# Patient Record
Sex: Female | Born: 1935
Health system: Southern US, Community
[De-identification: ages and names within clinical notes are randomized; demographics above are authoritative.]

## PROBLEM LIST (undated history)

## (undated) DIAGNOSIS — R202 Paresthesia of skin: Secondary | ICD-10-CM

## (undated) DIAGNOSIS — S43005A Unspecified dislocation of left shoulder joint, initial encounter: Secondary | ICD-10-CM

## (undated) DIAGNOSIS — C50912 Malignant neoplasm of unspecified site of left female breast: Secondary | ICD-10-CM

## (undated) DIAGNOSIS — R413 Other amnesia: Secondary | ICD-10-CM

## (undated) DIAGNOSIS — I10 Essential (primary) hypertension: Secondary | ICD-10-CM

## (undated) DIAGNOSIS — I639 Cerebral infarction, unspecified: Secondary | ICD-10-CM

## (undated) DIAGNOSIS — E785 Hyperlipidemia, unspecified: Secondary | ICD-10-CM

## (undated) DIAGNOSIS — Z8042 Family history of malignant neoplasm of prostate: Secondary | ICD-10-CM

## (undated) DIAGNOSIS — C801 Malignant (primary) neoplasm, unspecified: Secondary | ICD-10-CM

## (undated) DIAGNOSIS — R200 Anesthesia of skin: Secondary | ICD-10-CM

## (undated) DIAGNOSIS — M1990 Unspecified osteoarthritis, unspecified site: Secondary | ICD-10-CM

## (undated) HISTORY — PX: BREAST SURGERY: SHX581

## (undated) HISTORY — DX: Other amnesia: R41.3

## (undated) HISTORY — DX: Family history of malignant neoplasm of prostate: Z80.42

## (undated) HISTORY — PX: EYE SURGERY: SHX253

## (undated) HISTORY — PX: JOINT REPLACEMENT: SHX530

## (undated) HISTORY — DX: Paresthesia of skin: R20.2

## (undated) HISTORY — DX: Essential (primary) hypertension: I10

## (undated) HISTORY — DX: Anesthesia of skin: R20.0

## (undated) HISTORY — PX: APPENDECTOMY: SHX54

## (undated) HISTORY — PX: COLONOSCOPY: SHX174

## (undated) HISTORY — DX: Hyperlipidemia, unspecified: E78.5

## (undated) HISTORY — PX: ABDOMINAL HYSTERECTOMY: SHX81

## (undated) HISTORY — DX: Malignant (primary) neoplasm, unspecified: C80.1

---
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
## 1997-12-17 DIAGNOSIS — C50919 Malignant neoplasm of unspecified site of unspecified female breast: Secondary | ICD-10-CM

## 1997-12-17 HISTORY — DX: Malignant neoplasm of unspecified site of unspecified female breast: C50.919

## 1997-12-17 HISTORY — PX: MASTECTOMY: SHX3

## 1998-01-31 ENCOUNTER — Ambulatory Visit (HOSPITAL_COMMUNITY): Admission: RE | Admit: 1998-01-31 | Discharge: 1998-01-31 | Payer: Self-pay | Admitting: Surgery

## 1998-02-24 ENCOUNTER — Ambulatory Visit (HOSPITAL_COMMUNITY): Admission: RE | Admit: 1998-02-24 | Discharge: 1998-02-24 | Payer: Self-pay | Admitting: Surgery

## 1998-03-08 ENCOUNTER — Ambulatory Visit (HOSPITAL_COMMUNITY): Admission: AD | Admit: 1998-03-08 | Discharge: 1998-03-08 | Payer: Self-pay | Admitting: Surgery

## 1998-04-06 ENCOUNTER — Ambulatory Visit (HOSPITAL_COMMUNITY): Admission: RE | Admit: 1998-04-06 | Discharge: 1998-04-07 | Payer: Self-pay | Admitting: Surgery

## 1998-11-28 ENCOUNTER — Encounter: Payer: Self-pay | Admitting: Surgery

## 1998-11-28 ENCOUNTER — Ambulatory Visit (HOSPITAL_COMMUNITY): Admission: RE | Admit: 1998-11-28 | Discharge: 1998-11-28 | Payer: Self-pay | Admitting: Surgery

## 1999-05-29 ENCOUNTER — Ambulatory Visit (HOSPITAL_COMMUNITY): Admission: RE | Admit: 1999-05-29 | Discharge: 1999-05-29 | Payer: Self-pay | Admitting: Surgery

## 1999-07-11 ENCOUNTER — Encounter: Payer: Self-pay | Admitting: Surgery

## 1999-07-11 ENCOUNTER — Ambulatory Visit (HOSPITAL_BASED_OUTPATIENT_CLINIC_OR_DEPARTMENT_OTHER): Admission: RE | Admit: 1999-07-11 | Discharge: 1999-07-11 | Payer: Self-pay | Admitting: Surgery

## 1999-12-19 ENCOUNTER — Ambulatory Visit (HOSPITAL_COMMUNITY): Admission: RE | Admit: 1999-12-19 | Discharge: 1999-12-19 | Payer: Self-pay | Admitting: Surgery

## 1999-12-19 ENCOUNTER — Encounter: Payer: Self-pay | Admitting: Surgery

## 2000-02-02 ENCOUNTER — Encounter: Admission: RE | Admit: 2000-02-02 | Discharge: 2000-02-02 | Payer: Self-pay | Admitting: Family Medicine

## 2000-02-02 ENCOUNTER — Encounter: Payer: Self-pay | Admitting: Family Medicine

## 2000-12-27 ENCOUNTER — Encounter: Payer: Self-pay | Admitting: Surgery

## 2000-12-27 ENCOUNTER — Ambulatory Visit (HOSPITAL_COMMUNITY): Admission: RE | Admit: 2000-12-27 | Discharge: 2000-12-27 | Payer: Self-pay | Admitting: Surgery

## 2001-12-30 ENCOUNTER — Encounter: Payer: Self-pay | Admitting: Surgery

## 2001-12-30 ENCOUNTER — Ambulatory Visit (HOSPITAL_COMMUNITY): Admission: RE | Admit: 2001-12-30 | Discharge: 2001-12-30 | Payer: Self-pay | Admitting: Surgery

## 2002-12-31 ENCOUNTER — Ambulatory Visit (HOSPITAL_COMMUNITY): Admission: RE | Admit: 2002-12-31 | Discharge: 2002-12-31 | Payer: Self-pay | Admitting: Internal Medicine

## 2002-12-31 ENCOUNTER — Encounter: Payer: Self-pay | Admitting: Internal Medicine

## 2003-06-11 ENCOUNTER — Ambulatory Visit (HOSPITAL_BASED_OUTPATIENT_CLINIC_OR_DEPARTMENT_OTHER): Admission: RE | Admit: 2003-06-11 | Discharge: 2003-06-11 | Payer: Self-pay | Admitting: Surgery

## 2003-06-11 ENCOUNTER — Encounter (INDEPENDENT_AMBULATORY_CARE_PROVIDER_SITE_OTHER): Payer: Self-pay | Admitting: Specialist

## 2003-08-05 ENCOUNTER — Emergency Department (HOSPITAL_COMMUNITY): Admission: EM | Admit: 2003-08-05 | Discharge: 2003-08-05 | Payer: Self-pay

## 2003-08-05 ENCOUNTER — Encounter: Payer: Self-pay | Admitting: Emergency Medicine

## 2003-09-08 ENCOUNTER — Ambulatory Visit (HOSPITAL_BASED_OUTPATIENT_CLINIC_OR_DEPARTMENT_OTHER): Admission: RE | Admit: 2003-09-08 | Discharge: 2003-09-08 | Payer: Self-pay | Admitting: Orthopedic Surgery

## 2003-09-08 ENCOUNTER — Ambulatory Visit (HOSPITAL_COMMUNITY): Admission: RE | Admit: 2003-09-08 | Discharge: 2003-09-08 | Payer: Self-pay | Admitting: Orthopedic Surgery

## 2004-01-05 ENCOUNTER — Ambulatory Visit (HOSPITAL_COMMUNITY): Admission: RE | Admit: 2004-01-05 | Discharge: 2004-01-05 | Payer: Self-pay | Admitting: Surgery

## 2005-01-05 ENCOUNTER — Ambulatory Visit (HOSPITAL_COMMUNITY): Admission: RE | Admit: 2005-01-05 | Discharge: 2005-01-05 | Payer: Self-pay | Admitting: Surgery

## 2006-01-07 ENCOUNTER — Ambulatory Visit (HOSPITAL_COMMUNITY): Admission: RE | Admit: 2006-01-07 | Discharge: 2006-01-07 | Payer: Self-pay | Admitting: Surgery

## 2006-01-23 ENCOUNTER — Encounter: Admission: RE | Admit: 2006-01-23 | Discharge: 2006-01-23 | Payer: Self-pay | Admitting: Surgery

## 2006-03-04 ENCOUNTER — Encounter: Admission: RE | Admit: 2006-03-04 | Discharge: 2006-03-04 | Payer: Self-pay | Admitting: Surgery

## 2006-03-11 ENCOUNTER — Encounter: Admission: RE | Admit: 2006-03-11 | Discharge: 2006-03-11 | Payer: Self-pay | Admitting: Surgery

## 2006-04-17 ENCOUNTER — Encounter: Payer: Self-pay | Admitting: Surgery

## 2007-01-24 ENCOUNTER — Encounter: Admission: RE | Admit: 2007-01-24 | Discharge: 2007-01-24 | Payer: Self-pay | Admitting: Surgery

## 2007-02-20 ENCOUNTER — Encounter: Admission: RE | Admit: 2007-02-20 | Discharge: 2007-02-20 | Payer: Self-pay | Admitting: Surgery

## 2007-02-20 ENCOUNTER — Encounter (INDEPENDENT_AMBULATORY_CARE_PROVIDER_SITE_OTHER): Payer: Self-pay | Admitting: Specialist

## 2007-07-29 ENCOUNTER — Encounter: Admission: RE | Admit: 2007-07-29 | Discharge: 2007-07-29 | Payer: Self-pay | Admitting: Orthopedic Surgery

## 2007-07-29 ENCOUNTER — Encounter: Admission: RE | Admit: 2007-07-29 | Discharge: 2007-07-29 | Payer: Self-pay | Admitting: Surgery

## 2007-07-30 ENCOUNTER — Ambulatory Visit (HOSPITAL_BASED_OUTPATIENT_CLINIC_OR_DEPARTMENT_OTHER): Admission: RE | Admit: 2007-07-30 | Discharge: 2007-07-30 | Payer: Self-pay | Admitting: Orthopedic Surgery

## 2008-03-30 ENCOUNTER — Encounter: Admission: RE | Admit: 2008-03-30 | Discharge: 2008-03-30 | Payer: Self-pay | Admitting: Surgery

## 2008-07-29 ENCOUNTER — Encounter: Admission: RE | Admit: 2008-07-29 | Discharge: 2008-07-29 | Payer: Self-pay | Admitting: Surgery

## 2009-01-04 ENCOUNTER — Encounter: Admission: RE | Admit: 2009-01-04 | Discharge: 2009-01-04 | Payer: Self-pay | Admitting: Surgery

## 2009-06-01 ENCOUNTER — Encounter (INDEPENDENT_AMBULATORY_CARE_PROVIDER_SITE_OTHER): Payer: Self-pay | Admitting: Interventional Radiology

## 2009-06-01 ENCOUNTER — Other Ambulatory Visit: Admission: RE | Admit: 2009-06-01 | Discharge: 2009-06-01 | Payer: Self-pay | Admitting: Interventional Radiology

## 2009-06-01 ENCOUNTER — Encounter: Admission: RE | Admit: 2009-06-01 | Discharge: 2009-06-01 | Payer: Self-pay | Admitting: Surgery

## 2009-08-01 ENCOUNTER — Emergency Department (HOSPITAL_COMMUNITY): Admission: EM | Admit: 2009-08-01 | Discharge: 2009-08-01 | Payer: Self-pay | Admitting: Emergency Medicine

## 2009-09-15 ENCOUNTER — Encounter: Admission: RE | Admit: 2009-09-15 | Discharge: 2009-09-15 | Payer: Self-pay | Admitting: Family Medicine

## 2009-11-17 ENCOUNTER — Ambulatory Visit (HOSPITAL_COMMUNITY): Admission: RE | Admit: 2009-11-17 | Discharge: 2009-11-17 | Payer: Self-pay | Admitting: Neurology

## 2009-11-17 ENCOUNTER — Ambulatory Visit: Payer: Self-pay | Admitting: Cardiology

## 2009-11-17 ENCOUNTER — Ambulatory Visit: Payer: Self-pay

## 2009-11-17 ENCOUNTER — Encounter (INDEPENDENT_AMBULATORY_CARE_PROVIDER_SITE_OTHER): Payer: Self-pay | Admitting: Neurology

## 2010-01-05 ENCOUNTER — Encounter: Admission: RE | Admit: 2010-01-05 | Discharge: 2010-01-05 | Payer: Self-pay | Admitting: Surgery

## 2010-01-16 ENCOUNTER — Encounter: Admission: RE | Admit: 2010-01-16 | Discharge: 2010-01-16 | Payer: Self-pay | Admitting: Surgery

## 2010-01-19 ENCOUNTER — Encounter: Admission: RE | Admit: 2010-01-19 | Discharge: 2010-01-19 | Payer: Self-pay | Admitting: Family Medicine

## 2010-01-19 HISTORY — PX: BREAST BIOPSY: SHX20

## 2010-03-27 ENCOUNTER — Inpatient Hospital Stay (HOSPITAL_COMMUNITY): Admission: RE | Admit: 2010-03-27 | Discharge: 2010-03-29 | Payer: Self-pay | Admitting: Orthopedic Surgery

## 2011-01-04 ENCOUNTER — Other Ambulatory Visit (HOSPITAL_COMMUNITY): Payer: Self-pay | Admitting: Surgery

## 2011-01-04 DIAGNOSIS — Z0000 Encounter for general adult medical examination without abnormal findings: Secondary | ICD-10-CM

## 2011-01-22 ENCOUNTER — Other Ambulatory Visit: Payer: Self-pay | Admitting: Surgery

## 2011-01-22 ENCOUNTER — Ambulatory Visit
Admission: RE | Admit: 2011-01-22 | Discharge: 2011-01-22 | Disposition: A | Discharge status: Home / Self Care | Payer: BC Managed Care – PPO | Source: Ambulatory Visit | Attending: Surgery | Admitting: Surgery

## 2011-01-22 ENCOUNTER — Ambulatory Visit (HOSPITAL_COMMUNITY): Admission: RE | Admit: 2011-01-22 | Payer: Self-pay | Source: Home / Self Care | Admitting: Surgery

## 2011-01-22 ENCOUNTER — Ambulatory Visit (HOSPITAL_COMMUNITY): Payer: BC Managed Care – PPO

## 2011-01-22 DIAGNOSIS — Z0000 Encounter for general adult medical examination without abnormal findings: Secondary | ICD-10-CM

## 2011-03-07 LAB — DIFFERENTIAL
Basophils Absolute: 0 10*3/uL (ref 0.0–0.1)
Basophils Relative: 0 % (ref 0–1)
Eosinophils Absolute: 0.4 10*3/uL (ref 0.0–0.7)
Eosinophils Relative: 6 % — ABNORMAL HIGH (ref 0–5)
Lymphocytes Relative: 27 % (ref 12–46)
Lymphs Abs: 1.8 10*3/uL (ref 0.7–4.0)
Monocytes Absolute: 0.3 10*3/uL (ref 0.1–1.0)
Monocytes Relative: 5 % (ref 3–12)
Neutro Abs: 4 10*3/uL (ref 1.7–7.7)
Neutrophils Relative %: 61 % (ref 43–77)

## 2011-03-07 LAB — URINALYSIS, ROUTINE W REFLEX MICROSCOPIC
Bilirubin Urine: NEGATIVE
Glucose, UA: NEGATIVE mg/dL
Hgb urine dipstick: NEGATIVE
Ketones, ur: NEGATIVE mg/dL
Nitrite: NEGATIVE
Protein, ur: NEGATIVE mg/dL
Specific Gravity, Urine: 1.011 (ref 1.005–1.030)
Urobilinogen, UA: 0.2 mg/dL (ref 0.0–1.0)
pH: 7 (ref 5.0–8.0)

## 2011-03-07 LAB — CROSSMATCH
ABO/RH(D): A POS
Antibody Screen: NEGATIVE

## 2011-03-07 LAB — HEMOGLOBIN A1C
Hgb A1c MFr Bld: 7.2 % — ABNORMAL HIGH (ref 4.6–6.1)
Mean Plasma Glucose: 160 mg/dL

## 2011-03-07 LAB — URINE MICROSCOPIC-ADD ON

## 2011-03-07 LAB — CBC
HCT: 27.5 % — ABNORMAL LOW (ref 36.0–46.0)
HCT: 30.4 % — ABNORMAL LOW (ref 36.0–46.0)
HCT: 40.1 % (ref 36.0–46.0)
Hemoglobin: 10.2 g/dL — ABNORMAL LOW (ref 12.0–15.0)
Hemoglobin: 13.5 g/dL (ref 12.0–15.0)
Hemoglobin: 9.3 g/dL — ABNORMAL LOW (ref 12.0–15.0)
MCHC: 33.5 g/dL (ref 30.0–36.0)
MCHC: 33.6 g/dL (ref 30.0–36.0)
MCHC: 33.9 g/dL (ref 30.0–36.0)
MCV: 89.4 fL (ref 78.0–100.0)
MCV: 90.3 fL (ref 78.0–100.0)
MCV: 90.5 fL (ref 78.0–100.0)
Platelets: 150 10*3/uL (ref 150–400)
Platelets: 175 10*3/uL (ref 150–400)
Platelets: 238 10*3/uL (ref 150–400)
RBC: 3.05 MIL/uL — ABNORMAL LOW (ref 3.87–5.11)
RBC: 3.36 MIL/uL — ABNORMAL LOW (ref 3.87–5.11)
RBC: 4.49 MIL/uL (ref 3.87–5.11)
RDW: 13.7 % (ref 11.5–15.5)
RDW: 13.9 % (ref 11.5–15.5)
RDW: 14.1 % (ref 11.5–15.5)
WBC: 6.6 10*3/uL (ref 4.0–10.5)
WBC: 8.8 10*3/uL (ref 4.0–10.5)
WBC: 9.7 10*3/uL (ref 4.0–10.5)

## 2011-03-07 LAB — GLUCOSE, CAPILLARY
Glucose-Capillary: 142 mg/dL — ABNORMAL HIGH (ref 70–99)
Glucose-Capillary: 163 mg/dL — ABNORMAL HIGH (ref 70–99)
Glucose-Capillary: 172 mg/dL — ABNORMAL HIGH (ref 70–99)
Glucose-Capillary: 207 mg/dL — ABNORMAL HIGH (ref 70–99)
Glucose-Capillary: 207 mg/dL — ABNORMAL HIGH (ref 70–99)
Glucose-Capillary: 213 mg/dL — ABNORMAL HIGH (ref 70–99)
Glucose-Capillary: 216 mg/dL — ABNORMAL HIGH (ref 70–99)
Glucose-Capillary: 235 mg/dL — ABNORMAL HIGH (ref 70–99)
Glucose-Capillary: 347 mg/dL — ABNORMAL HIGH (ref 70–99)

## 2011-03-07 LAB — BASIC METABOLIC PANEL
BUN: 12 mg/dL (ref 6–23)
BUN: 7 mg/dL (ref 6–23)
CO2: 32 mEq/L (ref 19–32)
CO2: 34 mEq/L — ABNORMAL HIGH (ref 19–32)
Calcium: 8.5 mg/dL (ref 8.4–10.5)
Calcium: 9.2 mg/dL (ref 8.4–10.5)
Chloride: 101 mEq/L (ref 96–112)
Chloride: 99 mEq/L (ref 96–112)
Creatinine, Ser: 0.93 mg/dL (ref 0.4–1.2)
Creatinine, Ser: 1.19 mg/dL (ref 0.4–1.2)
GFR calc Af Amer: 54 mL/min — ABNORMAL LOW (ref >60)
GFR calc Af Amer: >60 mL/min (ref >60)
GFR calc non Af Amer: 44 mL/min — ABNORMAL LOW (ref >60)
GFR calc non Af Amer: 59 mL/min — ABNORMAL LOW (ref >60)
Glucose, Bld: 166 mg/dL — ABNORMAL HIGH (ref 70–99)
Glucose, Bld: 188 mg/dL — ABNORMAL HIGH (ref 70–99)
Potassium: 3.1 mEq/L — ABNORMAL LOW (ref 3.5–5.1)
Potassium: 3.1 mEq/L — ABNORMAL LOW (ref 3.5–5.1)
Sodium: 138 mEq/L (ref 135–145)
Sodium: 139 mEq/L (ref 135–145)

## 2011-03-07 LAB — COMPREHENSIVE METABOLIC PANEL
ALT: 19 U/L (ref 0–35)
AST: 19 U/L (ref 0–37)
Albumin: 4.1 g/dL (ref 3.5–5.2)
Alkaline Phosphatase: 72 U/L (ref 39–117)
BUN: 12 mg/dL (ref 6–23)
CO2: 34 mEq/L — ABNORMAL HIGH (ref 19–32)
Calcium: 10.3 mg/dL (ref 8.4–10.5)
Chloride: 100 mEq/L (ref 96–112)
Creatinine, Ser: 0.7 mg/dL (ref 0.4–1.2)
GFR calc Af Amer: >60 mL/min (ref >60)
GFR calc non Af Amer: >60 mL/min (ref >60)
Glucose, Bld: 116 mg/dL — ABNORMAL HIGH (ref 70–99)
Potassium: 3.5 mEq/L (ref 3.5–5.1)
Sodium: 140 mEq/L (ref 135–145)
Total Bilirubin: 0.6 mg/dL (ref 0.3–1.2)
Total Protein: 6.9 g/dL (ref 6.0–8.3)

## 2011-03-07 LAB — URINE CULTURE: Colony Count: 25000

## 2011-03-07 LAB — PROTIME-INR
INR: 1 (ref 0.00–1.49)
Prothrombin Time: 13.1 seconds (ref 11.6–15.2)

## 2011-03-07 LAB — APTT: aPTT: 24 seconds (ref 24–37)

## 2011-03-07 LAB — ABO/RH: ABO/RH(D): A POS

## 2011-03-24 LAB — URINALYSIS, ROUTINE W REFLEX MICROSCOPIC
Bilirubin Urine: NEGATIVE
Glucose, UA: NEGATIVE mg/dL
Hgb urine dipstick: NEGATIVE
Ketones, ur: NEGATIVE mg/dL
Nitrite: NEGATIVE
Protein, ur: NEGATIVE mg/dL
Specific Gravity, Urine: 1.009 (ref 1.005–1.030)
Urobilinogen, UA: 0.2 mg/dL (ref 0.0–1.0)
pH: 6.5 (ref 5.0–8.0)

## 2011-03-24 LAB — BASIC METABOLIC PANEL
BUN: 14 mg/dL (ref 6–23)
CO2: 29 mEq/L (ref 19–32)
Calcium: 10 mg/dL (ref 8.4–10.5)
Chloride: 99 mEq/L (ref 96–112)
Creatinine, Ser: 0.71 mg/dL (ref 0.4–1.2)
GFR calc Af Amer: >60 mL/min (ref >60)
GFR calc non Af Amer: >60 mL/min (ref >60)
Glucose, Bld: 216 mg/dL — ABNORMAL HIGH (ref 70–99)
Potassium: 3 mEq/L — ABNORMAL LOW (ref 3.5–5.1)
Sodium: 139 mEq/L (ref 135–145)

## 2011-03-24 LAB — DIFFERENTIAL
Basophils Absolute: 0 10*3/uL (ref 0.0–0.1)
Basophils Relative: 1 % (ref 0–1)
Eosinophils Absolute: 0.3 10*3/uL (ref 0.0–0.7)
Eosinophils Relative: 4 % (ref 0–5)
Lymphocytes Relative: 20 % (ref 12–46)
Lymphs Abs: 1.7 10*3/uL (ref 0.7–4.0)
Monocytes Absolute: 0.3 10*3/uL (ref 0.1–1.0)
Monocytes Relative: 4 % (ref 3–12)
Neutro Abs: 5.9 10*3/uL (ref 1.7–7.7)
Neutrophils Relative %: 71 % (ref 43–77)

## 2011-03-24 LAB — CBC
HCT: 42.5 % (ref 36.0–46.0)
Hemoglobin: 14.3 g/dL (ref 12.0–15.0)
MCHC: 33.7 g/dL (ref 30.0–36.0)
MCV: 89 fL (ref 78.0–100.0)
Platelets: 273 10*3/uL (ref 150–400)
RBC: 4.78 MIL/uL (ref 3.87–5.11)
RDW: 14 % (ref 11.5–15.5)
WBC: 8.3 10*3/uL (ref 4.0–10.5)

## 2011-03-24 LAB — URINE MICROSCOPIC-ADD ON

## 2011-03-24 LAB — GLUCOSE, CAPILLARY: Glucose-Capillary: 159 mg/dL — ABNORMAL HIGH (ref 70–99)

## 2011-04-05 ENCOUNTER — Other Ambulatory Visit: Payer: Self-pay | Admitting: Gastroenterology

## 2011-05-01 NOTE — Op Note (Signed)
[name], Amanda Green                 ACCOUNT NO.:  [id number]   MEDICAL RECORD NO.:  [id number]          PATIENT TYPE:  AMB   LOCATION:  DSC                          FACILITY:  [facility]   PHYSICIAN:  Cindee Salt, M.D.       DATE OF BIRTH:  [birth year]   DATE OF PROCEDURE:  07/30/2007  DATE OF DISCHARGE:                               OPERATIVE REPORT   PREOPERATIVE DIAGNOSIS:  Stenosing tenosynovitis right thumb.   POSTOPERATIVE DIAGNOSIS:  Stenosing tenosynovitis right thumb.   OPERATION:  Release A1 pulley right thumb.   SURGEON:  Cindee Salt, M.D.   ASSISTANT:  Carolyne Fiscal R.N.   ANESTHESIA:  Forearm based IV regional.   ANESTHESIOLOGIST:  Guadalupe Maple, M.D.   HISTORY:  The patient is a 76 year old female with a history of  triggering of her right thumb.  This has not responded to conservative  treatment.  She is desirous of surgical release.  She is aware of risks  and complications including infection, recurrence, injury to arteries,  nerves, tendons incomplete relief of symptoms, dystrophy.  She has  elected to proceed to have this released.  In the preoperative area the  patient is seen.  The extremity marked by both the patient and surgeon.  Antibiotic given, questions encouraged and answered.   PROCEDURE:  The patient is brought to the operating room where a forearm  based IV regional anesthetic was carried out without difficulty.  She  was prepped using DuraPrep, supine position, right arm free.  A  transverse incision was made over the A1 pulley of the right thumb,  carried down through subcutaneous tissue.  Bleeders were  electrocauterized.  The dissection carried down to the A1 pulley.  This  was found to be thickened and tight.  The neurovascular structures were  retracted and incision was then made on the radial aspect of the oblique  pulley.  The oblique pulley was left intact.  The wound was irrigated.  Thumb placed through full range motion, no further triggering was  evident.  The skin was closed interrupted 5-0 Vicryl Rapide sutures.  Sterile compressive dressing was applied.  The patient tolerated the  procedure well.  She is discharged home to return to [facility] of  [place] in one week on Vicodin.  This Kuzma dictating very here was  placed.  To the office and to Dr. August Saucer thank you           ______________________________  Cindee Salt, M.D.    GK/MEDQ  D:  07/30/2007  T:  07/31/2007  Job:  [id number]   cc:   Dr. August Saucer
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
## 2011-09-06 ENCOUNTER — Encounter (INDEPENDENT_AMBULATORY_CARE_PROVIDER_SITE_OTHER): Payer: Self-pay | Admitting: Surgery

## 2011-09-06 ENCOUNTER — Ambulatory Visit (INDEPENDENT_AMBULATORY_CARE_PROVIDER_SITE_OTHER): Payer: BC Managed Care – PPO | Admitting: Surgery

## 2011-09-06 VITALS — BP 148/82 | HR 100 | Temp 96.4°F | Resp 32 | Ht 63.0 in | Wt 178.4 lb

## 2011-09-06 DIAGNOSIS — D170 Benign lipomatous neoplasm of skin and subcutaneous tissue of head, face and neck: Secondary | ICD-10-CM

## 2011-09-06 DIAGNOSIS — D1739 Benign lipomatous neoplasm of skin and subcutaneous tissue of other sites: Secondary | ICD-10-CM

## 2011-09-06 NOTE — Progress Notes (Signed)
Amanda Green rreturns today with pain in her left shoulder at a site where a lipoma that was previously excised has recurred. This recurrence is lateral to her incision. Since we have gone to Epic, my old op note is not available.  Because this area is painful she was prefer to have something done about that and I agree that we could re\re excise that area.  Would recommend excision at day surgery under general in the prone position.

## 2011-10-01 LAB — BASIC METABOLIC PANEL
BUN: 13
CO2: 33 — ABNORMAL HIGH
Calcium: 10.1
Chloride: 103
Creatinine, Ser: 0.66
GFR calc Af Amer: >60
GFR calc non Af Amer: >60
Glucose, Bld: 153 — ABNORMAL HIGH
Potassium: 4.1
Sodium: 141

## 2011-10-01 LAB — POCT HEMOGLOBIN-HEMACUE
Hemoglobin: 13.2
Operator id: 116011

## 2011-11-26 ENCOUNTER — Encounter (HOSPITAL_BASED_OUTPATIENT_CLINIC_OR_DEPARTMENT_OTHER): Payer: Self-pay | Admitting: *Deleted

## 2011-11-26 NOTE — Progress Notes (Signed)
Coming in for ekg,cxr,lab

## 2011-11-27 ENCOUNTER — Other Ambulatory Visit: Payer: Self-pay

## 2011-11-27 ENCOUNTER — Ambulatory Visit
Admission: RE | Admit: 2011-11-27 | Discharge: 2011-11-27 | Disposition: A | Discharge status: Home / Self Care | Payer: BC Managed Care – PPO | Source: Ambulatory Visit | Attending: Anesthesiology | Admitting: Anesthesiology

## 2011-11-27 ENCOUNTER — Encounter (HOSPITAL_BASED_OUTPATIENT_CLINIC_OR_DEPARTMENT_OTHER)
Admission: RE | Admit: 2011-11-27 | Discharge: 2011-11-27 | Disposition: A | Discharge status: Home / Self Care | Payer: BC Managed Care – PPO | Source: Ambulatory Visit | Attending: Surgery | Admitting: Surgery

## 2011-11-27 LAB — BASIC METABOLIC PANEL
BUN: 14 mg/dL (ref 6–23)
CO2: 32 mEq/L (ref 19–32)
Calcium: 10.4 mg/dL (ref 8.4–10.5)
Chloride: 101 mEq/L (ref 96–112)
Creatinine, Ser: 0.77 mg/dL (ref 0.50–1.10)
GFR calc Af Amer: >90 mL/min (ref >90)
GFR calc non Af Amer: 81 mL/min — ABNORMAL LOW (ref >90)
Glucose, Bld: 178 mg/dL — ABNORMAL HIGH (ref 70–99)
Potassium: 4.2 mEq/L (ref 3.5–5.1)
Sodium: 140 mEq/L (ref 135–145)

## 2011-11-28 ENCOUNTER — Encounter (HOSPITAL_BASED_OUTPATIENT_CLINIC_OR_DEPARTMENT_OTHER): Payer: Self-pay | Admitting: *Deleted

## 2011-11-28 ENCOUNTER — Encounter (HOSPITAL_BASED_OUTPATIENT_CLINIC_OR_DEPARTMENT_OTHER): Payer: Self-pay | Admitting: Anesthesiology

## 2011-11-28 ENCOUNTER — Ambulatory Visit (HOSPITAL_BASED_OUTPATIENT_CLINIC_OR_DEPARTMENT_OTHER)
Admission: RE | Admit: 2011-11-28 | Discharge: 2011-11-28 | Disposition: A | Discharge status: Home / Self Care | Payer: BC Managed Care – PPO | Source: Ambulatory Visit | Attending: Surgery | Admitting: Surgery

## 2011-11-28 ENCOUNTER — Ambulatory Visit (HOSPITAL_BASED_OUTPATIENT_CLINIC_OR_DEPARTMENT_OTHER): Payer: BC Managed Care – PPO | Admitting: Anesthesiology

## 2011-11-28 ENCOUNTER — Encounter (HOSPITAL_BASED_OUTPATIENT_CLINIC_OR_DEPARTMENT_OTHER)
Admission: RE | Disposition: A | Discharge status: Home / Self Care | Payer: Self-pay | Source: Ambulatory Visit | Attending: Surgery

## 2011-11-28 ENCOUNTER — Other Ambulatory Visit (INDEPENDENT_AMBULATORY_CARE_PROVIDER_SITE_OTHER): Payer: Self-pay | Admitting: Surgery

## 2011-11-28 DIAGNOSIS — I10 Essential (primary) hypertension: Secondary | ICD-10-CM | POA: Insufficient documentation

## 2011-11-28 DIAGNOSIS — E119 Type 2 diabetes mellitus without complications: Secondary | ICD-10-CM | POA: Insufficient documentation

## 2011-11-28 DIAGNOSIS — D1739 Benign lipomatous neoplasm of skin and subcutaneous tissue of other sites: Secondary | ICD-10-CM | POA: Insufficient documentation

## 2011-11-28 DIAGNOSIS — Z01812 Encounter for preprocedural laboratory examination: Secondary | ICD-10-CM | POA: Insufficient documentation

## 2011-11-28 DIAGNOSIS — Z01818 Encounter for other preprocedural examination: Secondary | ICD-10-CM | POA: Insufficient documentation

## 2011-11-28 HISTORY — DX: Unspecified osteoarthritis, unspecified site: M19.90

## 2011-11-28 HISTORY — PX: LIPOMA EXCISION: SHX5283

## 2011-11-28 LAB — GLUCOSE, CAPILLARY
Glucose-Capillary: 131 mg/dL — ABNORMAL HIGH (ref 70–99)
Glucose-Capillary: 134 mg/dL — ABNORMAL HIGH (ref 70–99)

## 2011-11-28 LAB — POCT HEMOGLOBIN-HEMACUE: Hemoglobin: 14.4 g/dL (ref 12.0–15.0)

## 2011-11-28 SURGERY — EXCISION LIPOMA
Anesthesia: General | Site: Neck | Wound class: Clean

## 2011-11-28 MED ORDER — METOCLOPRAMIDE HCL 5 MG/ML IJ SOLN
INTRAMUSCULAR | Status: DC | PRN
  Administered 2011-11-28: 10 mg via INTRAVENOUS

## 2011-11-28 MED ORDER — MIDAZOLAM HCL 2 MG/2ML IJ SOLN: 0.5000 mg | INTRAMUSCULAR | Status: DC | PRN

## 2011-11-28 MED ORDER — HYDROCODONE-ACETAMINOPHEN 5-500 MG PO TABS: 1.0000 | ORAL_TABLET | Freq: Every day | ORAL | Status: AC

## 2011-11-28 MED ORDER — SUCCINYLCHOLINE CHLORIDE 20 MG/ML IJ SOLN
INTRAMUSCULAR | Status: DC | PRN
  Administered 2011-11-28: 100 mg via INTRAVENOUS

## 2011-11-28 MED ORDER — METOCLOPRAMIDE HCL 5 MG/ML IJ SOLN: 10.0000 mg | Freq: Once | INTRAMUSCULAR | Status: DC | PRN

## 2011-11-28 MED ORDER — MIDAZOLAM HCL 5 MG/5ML IJ SOLN
INTRAMUSCULAR | Status: DC | PRN
  Administered 2011-11-28: 1 mg via INTRAVENOUS

## 2011-11-28 MED ORDER — FENTANYL CITRATE 0.05 MG/ML IJ SOLN
INTRAMUSCULAR | Status: DC | PRN
  Administered 2011-11-28: 50 ug via INTRAVENOUS

## 2011-11-28 MED ORDER — LACTATED RINGERS IV SOLN
INTRAVENOUS | Status: DC
  Administered 2011-11-28 (×2): via INTRAVENOUS

## 2011-11-28 MED ORDER — BUPIVACAINE HCL (PF) 0.5 % IJ SOLN
INTRAMUSCULAR | Status: DC | PRN
  Administered 2011-11-28: 10 mL

## 2011-11-28 MED ORDER — ONDANSETRON HCL 4 MG/2ML IJ SOLN
INTRAMUSCULAR | Status: DC | PRN
  Administered 2011-11-28: 4 mg via INTRAVENOUS

## 2011-11-28 MED ORDER — MORPHINE SULFATE 2 MG/ML IJ SOLN: 0.0500 mg/kg | INTRAMUSCULAR | Status: DC | PRN

## 2011-11-28 MED ORDER — ACETAMINOPHEN 10 MG/ML IV SOLN
1000.0000 mg | Freq: Once | INTRAVENOUS | Status: AC
  Administered 2011-11-28: 1000 mg via INTRAVENOUS

## 2011-11-28 MED ORDER — FENTANYL CITRATE 0.05 MG/ML IJ SOLN: 25.0000 ug | INTRAMUSCULAR | Status: DC | PRN

## 2011-11-28 MED ORDER — PROPOFOL 10 MG/ML IV EMUL
INTRAVENOUS | Status: DC | PRN
  Administered 2011-11-28: 200 mg via INTRAVENOUS

## 2011-11-28 MED ORDER — CEFAZOLIN SODIUM 1-5 GM-% IV SOLN
1.0000 g | INTRAVENOUS | Status: AC
  Administered 2011-11-28: 1 g via INTRAVENOUS

## 2011-11-28 SURGICAL SUPPLY — 45 items
ADH SKN CLS APL DERMABOND .7 (GAUZE/BANDAGES/DRESSINGS) ×1
APL SKNCLS STERI-STRIP NONHPOA (GAUZE/BANDAGES/DRESSINGS)
BENZOIN TINCTURE PRP APPL 2/3 (GAUZE/BANDAGES/DRESSINGS) IMPLANT
BLADE SURG 15 STRL LF DISP TIS (BLADE) ×1 IMPLANT
BLADE SURG 15 STRL SS (BLADE) ×2
BLADE SURG ROTATE 9660 (MISCELLANEOUS) IMPLANT
CANISTER SUCTION 1200CC (MISCELLANEOUS) IMPLANT
CLEANER CAUTERY TIP 5X5 PAD (MISCELLANEOUS) ×1 IMPLANT
CLOTH BEACON ORANGE TIMEOUT ST (SAFETY) ×2 IMPLANT
COVER MAYO STAND STRL (DRAPES) ×2 IMPLANT
COVER TABLE BACK 60X90 (DRAPES) ×2 IMPLANT
DECANTER SPIKE VIAL GLASS SM (MISCELLANEOUS) ×1 IMPLANT
DERMABOND ADVANCED (GAUZE/BANDAGES/DRESSINGS) ×1
DERMABOND ADVANCED .7 DNX12 (GAUZE/BANDAGES/DRESSINGS) IMPLANT
DRAPE PED LAPAROTOMY (DRAPES) ×1 IMPLANT
DRAPE U-SHAPE 76X120 STRL (DRAPES) IMPLANT
ELECT REM PT RETURN 9FT ADLT (ELECTROSURGICAL) ×2
ELECTRODE REM PT RTRN 9FT ADLT (ELECTROSURGICAL) ×1 IMPLANT
GAUZE SPONGE 4X4 12PLY STRL LF (GAUZE/BANDAGES/DRESSINGS) ×1 IMPLANT
GLOVE BIO SURGEON STRL SZ8 (GLOVE) ×2 IMPLANT
GLOVE INDICATOR 6.5 STRL GRN (GLOVE) ×1 IMPLANT
GLOVE INDICATOR 7.0 STRL GRN (GLOVE) ×1 IMPLANT
GOWN PREVENTION PLUS XLARGE (GOWN DISPOSABLE) ×2 IMPLANT
GOWN PREVENTION PLUS XXLARGE (GOWN DISPOSABLE) ×2 IMPLANT
NDL HYPO 25X1 1.5 SAFETY (NEEDLE) ×1 IMPLANT
NEEDLE 27GAX1X1/2 (NEEDLE) IMPLANT
NEEDLE HYPO 25X1 1.5 SAFETY (NEEDLE) ×2 IMPLANT
NS IRRIG 1000ML POUR BTL (IV SOLUTION) ×1 IMPLANT
PACK BASIN DAY SURGERY FS (CUSTOM PROCEDURE TRAY) ×2 IMPLANT
PAD CLEANER CAUTERY TIP 5X5 (MISCELLANEOUS) ×1
PENCIL BUTTON HOLSTER BLD 10FT (ELECTRODE) ×2 IMPLANT
STRIP CLOSURE SKIN 1/2X4 (GAUZE/BANDAGES/DRESSINGS) IMPLANT
SUT ETHILON 3 0 FSL (SUTURE) IMPLANT
SUT ETHILON 5 0 PS 2 18 (SUTURE) IMPLANT
SUT VIC AB 4-0 SH 18 (SUTURE) ×2 IMPLANT
SUT VIC AB 5-0 PS2 18 (SUTURE) IMPLANT
SUT VICRYL 3-0 CR8 SH (SUTURE) IMPLANT
SYR BULB 3OZ (MISCELLANEOUS) ×2 IMPLANT
SYR CONTROL 10ML LL (SYRINGE) ×2 IMPLANT
TOWEL OR 17X24 6PK STRL BLUE (TOWEL DISPOSABLE) ×2 IMPLANT
TRAY DSU PREP LF (CUSTOM PROCEDURE TRAY) ×2 IMPLANT
TUBE CONNECTING 20X1/4 (TUBING) IMPLANT
UNDERPAD 30X30 INCONTINENT (UNDERPADS AND DIAPERS) IMPLANT
WATER STERILE IRR 1000ML POUR (IV SOLUTION) ×2 IMPLANT
YANKAUER SUCT BULB TIP NO VENT (SUCTIONS) IMPLANT

## 2011-11-28 NOTE — Anesthesia Postprocedure Evaluation (Signed)
  Anesthesia Post Note  Patient: Amanda Green  Procedure(s) Performed:  EXCISION LIPOMA - excisino lipoma 4 cm back of neck  Anesthesia type: General  Patient location: PACU  Post pain: Pain level controlled  Post assessment: Patient's Cardiovascular Status Stable  Last Vitals:  Filed Vitals:   11/28/11 1116  BP: 118/57  Pulse: 72  Temp: 36.4 C  Resp: 16    Post vital signs: Reviewed and stable  Level of consciousness: alert  Complications: No apparent anesthesia complications

## 2011-11-28 NOTE — Anesthesia Procedure Notes (Signed)
Procedure Name: Intubation Date/Time: 11/28/2011 8:54 AM Performed by: Zenia Resides D Pre-anesthesia Checklist: Patient identified, Emergency Drugs available, Suction available and Patient being monitored Patient Re-evaluated:Patient Re-evaluated prior to inductionOxygen Delivery Method: Circle System Utilized Preoxygenation: Pre-oxygenation with 100% oxygen Intubation Type: IV induction Ventilation: Mask ventilation without difficulty Laryngoscope Size: Mac and 3 Grade View: Grade III Tube type: Oral Tube size: 7.0 mm Number of attempts: 2 Airway Equipment and Method: stylet,  oral airway and video-laryngoscopy Placement Confirmation: ETT inserted through vocal cords under direct vision,  positive ETCO2 and breath sounds checked- equal and bilateral Secured at: 21 cm Tube secured with: Tape Dental Injury: Teeth and Oropharynx as per pre-operative assessment  Difficulty Due To: Difficult Airway- due to limited oral opening, Difficult Airway- due to dentition, Difficult Airway- due to reduced neck mobility and Difficulty was anticipated Future Recommendations: Recommend- induction with short-acting agent, and alternative techniques readily available

## 2011-11-28 NOTE — Anesthesia Preprocedure Evaluation (Signed)
Anesthesia Evaluation  Patient identified by MRN, date of birth, ID band Patient awake    Reviewed: Allergy & Precautions, H&P , NPO status , Patient's Chart, lab work & pertinent test results, reviewed documented beta blocker date and time   Airway Mallampati: II TM Distance: >3 FB Neck ROM: full    Dental   Pulmonary neg pulmonary ROS,          Cardiovascular hypertension,     Neuro/Psych Negative Neurological ROS  Negative Psych ROS   GI/Hepatic negative GI ROS, Neg liver ROS,   Endo/Other  Diabetes mellitus-, Type 2, Oral Hypoglycemic Agents  Renal/GU negative Renal ROS  Genitourinary negative   Musculoskeletal   Abdominal   Peds  Hematology negative hematology ROS (+)   Anesthesia Other Findings See surgeon's H&P   Reproductive/Obstetrics negative OB ROS                           Anesthesia Physical Anesthesia Plan  ASA: II  Anesthesia Plan: General   Post-op Pain Management:    Induction: Intravenous  Airway Management Planned: Oral ETT  Additional Equipment:   Intra-op Plan:   Post-operative Plan: Extubation in OR  Informed Consent: I have reviewed the patients History and Physical, chart, labs and discussed the procedure including the risks, benefits and alternatives for the proposed anesthesia with the patient or authorized representative who has indicated his/her understanding and acceptance.     Plan Discussed with: CRNA and Surgeon  Anesthesia Plan Comments:         Anesthesia Quick Evaluation

## 2011-11-28 NOTE — H&P (Signed)
Chief Complaint:  Mass on the back  Of neck  History of Present Illness:  Amanda Green is an 76 y.o. female with a sore mass that is on the back of her neck.  It is consistent with a lipoma.  I have discussed excision vs observation and after a period of observation she has decided to have this removed.   Past Medical History  Diagnosis Date  . Hyperlipidemia   . Hypertension   . Diabetes mellitus   . Cancer     breast  . Arthritis     Past Surgical History  Procedure Date  . Breast surgery     rt mastectomy-2000  . Tonsillectomy   . Abdominal hysterectomy   . Appendectomy   . Joint replacement     lt total knee 2011  . Eye surgery     cataracts    Medications Prior to Admission  Medication Dose Route Frequency Provider Last Rate Last Dose  . acetaminophen (OFIRMEV) IVPB 1,000 mg  1,000 mg Intravenous Once Constance Goltz, MD   1,000 mg at 11/28/11 0743  . ceFAZolin (ANCEF) IVPB 1 g/50 mL premix  1 g Intravenous 60 min Pre-Op Valarie Merino, MD      . lactated ringers infusion   Intravenous Continuous Germaine Pomfret, MD 20 mL/hr at 11/28/11 [phone]    . midazolam (VERSED) injection 0.5-2 mg  0.5-2 mg Intravenous PRN Constance Goltz, MD       Medications Prior to Admission  Medication Sig Dispense Refill  . amLODipine (NORVASC) 10 MG tablet       . cloNIDine (CATAPRES) 0.1 MG tablet       . glipiZIDE (GLUCOTROL XL) 5 MG 24 hr tablet       . hydrochlorothiazide (HYDRODIURIL) 25 MG tablet       . latanoprost (XALATAN) 0.005 % ophthalmic solution       . metFORMIN (GLUCOPHAGE) 500 MG tablet       . simvastatin (ZOCOR) 80 MG tablet       . GRALISE 600 MG TABS        No Known Allergies History reviewed. No pertinent family history. Social History:   reports that she has never smoked. She has never used smokeless tobacco. She reports that she does not drink alcohol or use illicit drugs.   REVIEW OF SYSTEMS - PERTINENT POSITIVES  ONLY: noncontributory  Physical Exam:   Blood pressure 126/73, pulse 53, temperature 98.7 F (37.1 C), temperature source Oral, resp. rate 20, height 5\' 3"  (1.6 m), weight 174 lb (78.926 kg), SpO2 98.00%. Body mass index is 30.82 kg/(m^2).  Gen:  No acute distress.  Well nourished and well groomed.   Neurological: Alert and oriented to person, place, and time. Coordination normal.  Head: Normocephalic and atraumatic.  Eyes: Conjunctivae are normal. Pupils are equal, round, and reactive to light. No scleral icterus.  Neck: Normal range of motion. Neck supple. No tracheal deviation or thyromegaly present. There is a 4 cm soft mass slightly to the left of midline at the C7 T1 level that is both noticeable and intermittently painful Cardiovascular:  SR without murmurs or gallops Respiratory: Effort normal.  No respiratory distress. No chest wall tenderness. Breath sounds normal.  No wheezes, rales or rhonchi.  Breast:  Right breast cancer GI: Soft. Bowel sounds are normal. The abdomen is soft and nontender.  There is no rebound and no guarding. Musculoskeletal: Normal range of motion. Extremities are nontender.  Lymphadenopathy:  No cervical, preauricular, postauricular or axillary adenopathy is present Skin: Skin is warm and dry. No rash noted. No diaphoresis. No erythema. No pallor. No clubbing, cyanosis, or edema.  Pscyh: Normal mood and affect. Behavior is normal. Judgment and thought content normal.   LABORATORY RESULTS: Results for orders placed during the hospital encounter of 11/28/11 (from the past 48 hour(s))  BASIC METABOLIC PANEL     Status: Abnormal   Collection Time   11/27/11 10:30 AM      Component Value Range Comment   Sodium 140  135 - 145 (mEq/L)    Potassium 4.2  3.5 - 5.1 (mEq/L)    Chloride 101  96 - 112 (mEq/L)    CO2 32  19 - 32 (mEq/L)    Glucose, Bld 178 (*) 70 - 99 (mg/dL)    BUN 14  6 - 23 (mg/dL)    Creatinine, Ser 1.47  0.50 - 1.10 (mg/dL)    Calcium 82.9   8.4 - 10.5 (mg/dL)    GFR calc non Af Amer 81 (*) >90 (mL/min)    GFR calc Af Amer >90  >90 (mL/min)   POCT HEMOGLOBIN-HEMACUE     Status: Normal   Collection Time   11/28/11  7:47 AM      Component Value Range Comment   Hemoglobin 14.4  12.0 - 15.0 (g/dL)   GLUCOSE, CAPILLARY     Status: Abnormal   Collection Time   11/28/11  7:48 AM      Component Value Range Comment   Glucose-Capillary 131 (*) 70 - 99 (mg/dL)     RADIOLOGY RESULTS: Dg Chest 2 View  11/27/2011  *RADIOLOGY REPORT*  Clinical Data: Preop for neck nodule,  diabetes  CHEST - 2 VIEW  Comparison: Chest x-ray of 03/23/2010  Findings: No active infiltrate or effusion is seen.  Surgical clips are present from prior right mastectomy.  Mediastinal contours are stable.  The heart is mildly enlarged and stable.  No bony abnormality is seen.  IMPRESSION: Stable mild cardiomegaly.  No active lung disease.  Prior right mastectomy.  Original Report Authenticated By: Juline Patch, M.D.    Problem List: Active Problems:  * No active hospital problems. *    Assessment & Plan: Probably lipoma of back Excision of mass    Matt B. Daphine Deutscher, MD, [facility] [phone] beeper [phone]  11/28/2011 8:36 AM  There has been no change in the patient's past medical history or physical exam in the past 24 hours to the best of my knowledge.  Expectations and outcome results have been discussed with the patient to include risks and benefits.  All questions have been answered and will proceed with previously discussed procedure noted and signed in the consent form in the patient's record.    Yiselle Babich BMD @NOW  11/28/2011

## 2011-11-28 NOTE — Op Note (Signed)
Surgeon: Wenda Low, MD, FACS  Asst:  none  Anes:  General in prone position  Procedure: Excision of lipoma from the upper bac,/neck  Diagnosis: lipoma  Complications: none  EBL:   5 cc  Description of Procedure:  Amanda Green was taken to OR 8 at [facility] and given general and rolled into the prone position.  She was prepped with PCMX and draped sterilely.  Timeout was performed.  The previously marked soft mass slightly to the left of midline was transversely incised and a angiolipomatous mass as seen discretely and then shelled out and removed piecemeal.  The cavity was irrigated and closed with 3-0 vicrly and Dermabond.    Matt B. Daphine Deutscher, MD, [facility], [place] [phone]

## 2011-11-28 NOTE — Transfer of Care (Signed)
Immediate Anesthesia Transfer of Care Note  Patient: Amanda Green  Procedure(s) Performed:  EXCISION LIPOMA - excisino lipoma 4 cm back of neck  Patient Location: PACU  Anesthesia Type: General  Level of Consciousness: awake and oriented  Airway & Oxygen Therapy: Patient Spontanous Breathing and Patient connected to face mask oxygen  Post-op Assessment: Report given to PACU RN and Post -op Vital signs reviewed and stable  Post vital signs: Reviewed and stable  Complications: No apparent anesthesia complications

## 2011-11-29 ENCOUNTER — Encounter (HOSPITAL_BASED_OUTPATIENT_CLINIC_OR_DEPARTMENT_OTHER): Payer: Self-pay | Admitting: Surgery

## 2011-12-14 ENCOUNTER — Ambulatory Visit (INDEPENDENT_AMBULATORY_CARE_PROVIDER_SITE_OTHER): Payer: BC Managed Care – PPO | Admitting: Surgery

## 2011-12-14 ENCOUNTER — Encounter (INDEPENDENT_AMBULATORY_CARE_PROVIDER_SITE_OTHER): Payer: Self-pay | Admitting: Surgery

## 2011-12-14 VITALS — BP 172/88 | HR 98 | Temp 97.2°F | Resp 24 | Ht 62.0 in | Wt 176.2 lb

## 2011-12-14 DIAGNOSIS — D171 Benign lipomatous neoplasm of skin and subcutaneous tissue of trunk: Secondary | ICD-10-CM

## 2011-12-14 DIAGNOSIS — D1779 Benign lipomatous neoplasm of other sites: Secondary | ICD-10-CM

## 2011-12-14 NOTE — Progress Notes (Signed)
  Amanda Green comes in today in the area on her back in our operating upon is healing nicely. She has a transverse incision in the upper part of her back on the left where are removed a very large benign lipoma. We gave her this news and I'll see her again after her mammogram for her routine breast checks for history of breast cancer.

## 2011-12-14 NOTE — Patient Instructions (Signed)
Pathology report benign fatty tumor of back.  "Lipoma"

## 2011-12-21 ENCOUNTER — Other Ambulatory Visit (INDEPENDENT_AMBULATORY_CARE_PROVIDER_SITE_OTHER): Payer: Self-pay | Admitting: Surgery

## 2011-12-21 DIAGNOSIS — Z1231 Encounter for screening mammogram for malignant neoplasm of breast: Secondary | ICD-10-CM

## 2011-12-21 DIAGNOSIS — Z9011 Acquired absence of right breast and nipple: Secondary | ICD-10-CM

## 2012-01-28 ENCOUNTER — Ambulatory Visit
Admission: RE | Admit: 2012-01-28 | Discharge: 2012-01-28 | Disposition: A | Discharge status: Home / Self Care | Payer: BC Managed Care – PPO | Source: Ambulatory Visit | Attending: Surgery | Admitting: Surgery

## 2012-01-28 DIAGNOSIS — Z9011 Acquired absence of right breast and nipple: Secondary | ICD-10-CM

## 2012-01-28 DIAGNOSIS — Z1231 Encounter for screening mammogram for malignant neoplasm of breast: Secondary | ICD-10-CM

## 2012-03-25 ENCOUNTER — Encounter (INDEPENDENT_AMBULATORY_CARE_PROVIDER_SITE_OTHER): Payer: Self-pay | Admitting: Surgery

## 2012-03-26 ENCOUNTER — Ambulatory Visit (INDEPENDENT_AMBULATORY_CARE_PROVIDER_SITE_OTHER): Payer: Medicare Other | Admitting: Surgery

## 2012-03-26 ENCOUNTER — Encounter (INDEPENDENT_AMBULATORY_CARE_PROVIDER_SITE_OTHER): Payer: Self-pay | Admitting: Surgery

## 2012-03-26 VITALS — BP 118/73 | HR 90 | Temp 97.6°F | Ht 62.0 in | Wt 177.0 lb

## 2012-03-26 DIAGNOSIS — Z9010 Acquired absence of unspecified breast and nipple: Secondary | ICD-10-CM | POA: Insufficient documentation

## 2012-03-26 NOTE — Progress Notes (Signed)
Amanda Green 77 y.o.  Body mass index is 32.37 kg/(m^2).  There is no problem list on file for this patient.   No Known Allergies  Past Surgical History  Procedure Date  . Breast surgery     rt mastectomy-2000  . Tonsillectomy   . Abdominal hysterectomy   . Appendectomy   . Joint replacement     lt total knee 2011  . Eye surgery     cataracts  . Lipoma excision 11/28/2011    Procedure: EXCISION LIPOMA;  Surgeon: Valarie Merino, MD;  Location: [facility];  Service: General;  Laterality: N/A;  excisino lipoma 4 cm back of neck   Amanda Stabile, MD, MD No diagnosis found.  Amanda Green comes in after her recent mammogram and after lipoma excision from the back of her neck.  She is not having the discomfort in her neck that she had and that incision is healed nicely. Her left breast exam today shows a fatty breast with no palpable masses and no nipple discharges. Axilla has a little fatty lipoma but no palpable nodes. Right chest wall is smooth and flaps were healed. No evidence for recurrence.  Mammogram done in February was negative. Plan return one year for routine followup. Matt B. Daphine Deutscher, MD, [facility] [phone] beeper [phone]  03/26/2012 3:21 PM

## 2013-01-01 ENCOUNTER — Other Ambulatory Visit (INDEPENDENT_AMBULATORY_CARE_PROVIDER_SITE_OTHER): Payer: Self-pay | Admitting: Surgery

## 2013-01-01 DIAGNOSIS — Z1231 Encounter for screening mammogram for malignant neoplasm of breast: Secondary | ICD-10-CM

## 2013-02-02 ENCOUNTER — Other Ambulatory Visit (INDEPENDENT_AMBULATORY_CARE_PROVIDER_SITE_OTHER): Payer: Self-pay | Admitting: Surgery

## 2013-02-02 ENCOUNTER — Ambulatory Visit (HOSPITAL_COMMUNITY)
Admission: RE | Admit: 2013-02-02 | Discharge: 2013-02-02 | Disposition: A | Discharge status: Home / Self Care | Payer: Medicare Other | Source: Ambulatory Visit | Attending: Surgery | Admitting: Surgery

## 2013-02-02 DIAGNOSIS — Z1231 Encounter for screening mammogram for malignant neoplasm of breast: Secondary | ICD-10-CM | POA: Insufficient documentation

## 2013-04-24 ENCOUNTER — Ambulatory Visit (INDEPENDENT_AMBULATORY_CARE_PROVIDER_SITE_OTHER): Payer: PRIVATE HEALTH INSURANCE | Admitting: Surgery

## 2013-04-24 ENCOUNTER — Encounter (INDEPENDENT_AMBULATORY_CARE_PROVIDER_SITE_OTHER): Payer: Self-pay | Admitting: Surgery

## 2013-04-24 VITALS — BP 160/78 | HR 90 | Temp 97.3°F | Ht 62.0 in | Wt 164.2 lb

## 2013-04-24 DIAGNOSIS — Z9011 Acquired absence of right breast and nipple: Secondary | ICD-10-CM

## 2013-04-24 DIAGNOSIS — Z9010 Acquired absence of unspecified breast and nipple: Secondary | ICD-10-CM

## 2013-04-24 NOTE — Patient Instructions (Signed)
Thanks for your patience.  If you need further assistance after leaving the office, please call our office and speak with a [facility] nurse.  [phone].  If you want to leave a message for Dr. Izaya Netherton, please call his office phone at [phone]. 

## 2013-04-24 NOTE — Progress Notes (Signed)
Amanda Green 78 y.o.  Body mass index is 30.03 kg/(m^2).  Patient Active Problem List   Diagnosis Date Noted  . H/O mastectomy 03/26/2012    No Known Allergies  Past Surgical History  Procedure Laterality Date  . Tonsillectomy    . Abdominal hysterectomy    . Appendectomy    . Joint replacement      lt total knee 2011  . Eye surgery      cataracts  . Lipoma excision  11/28/2011    Procedure: EXCISION LIPOMA;  Surgeon: Amanda Merino, MD;  Location: [facility];  Service: General;  Laterality: N/A;  excisino lipoma 4 cm back of neck  . Breast surgery      Right mastectomy 1999   Amanda Stabile, MD No diagnosis found.  15 years post right mastectomy.  Recent mammogram was negative for recurrence or suspicious areas. On physical exam today I do not palpate any areas in the right axilla. The flaps were smooth. The left breast is large and pendulous. No skin changes are noted. No nipple discharges are reported. No palpable masses are perceived. Axilla without masses except for some fat pad more beneath the pectoralis major. No supraclavicular masses. No complaints of back pain. 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  I'll see her again in routine follow up in one year Amanda B. Daphine Deutscher, MD, [facility] [phone] beeper [phone]  04/24/2013 11:17 AM

## 2013-09-03 ENCOUNTER — Encounter (INDEPENDENT_AMBULATORY_CARE_PROVIDER_SITE_OTHER): Payer: Self-pay

## 2013-09-03 ENCOUNTER — Other Ambulatory Visit (INDEPENDENT_AMBULATORY_CARE_PROVIDER_SITE_OTHER): Payer: Self-pay | Admitting: *Deleted

## 2013-09-03 MED ORDER — UNABLE TO FIND: Status: DC

## 2014-01-12 ENCOUNTER — Other Ambulatory Visit (INDEPENDENT_AMBULATORY_CARE_PROVIDER_SITE_OTHER): Payer: Self-pay | Admitting: Surgery

## 2014-01-12 DIAGNOSIS — Z1231 Encounter for screening mammogram for malignant neoplasm of breast: Secondary | ICD-10-CM

## 2014-02-04 ENCOUNTER — Ambulatory Visit (HOSPITAL_COMMUNITY): Admission: RE | Admit: 2014-02-04 | Payer: Medicare Other | Source: Ambulatory Visit

## 2014-02-09 ENCOUNTER — Ambulatory Visit (HOSPITAL_COMMUNITY): Payer: Medicare Other

## 2014-02-10 ENCOUNTER — Other Ambulatory Visit (INDEPENDENT_AMBULATORY_CARE_PROVIDER_SITE_OTHER): Payer: Self-pay | Admitting: Surgery

## 2014-02-10 ENCOUNTER — Ambulatory Visit (HOSPITAL_COMMUNITY): Admission: RE | Admit: 2014-02-10 | Payer: Medicare Other | Source: Ambulatory Visit

## 2014-02-10 ENCOUNTER — Ambulatory Visit (HOSPITAL_COMMUNITY)
Admission: RE | Admit: 2014-02-10 | Discharge: 2014-02-10 | Disposition: A | Discharge status: Home / Self Care | Payer: Medicare Other | Source: Ambulatory Visit | Attending: Surgery | Admitting: Surgery

## 2014-02-10 DIAGNOSIS — Z1231 Encounter for screening mammogram for malignant neoplasm of breast: Secondary | ICD-10-CM

## 2014-04-15 ENCOUNTER — Ambulatory Visit (INDEPENDENT_AMBULATORY_CARE_PROVIDER_SITE_OTHER): Payer: PRIVATE HEALTH INSURANCE | Admitting: Surgery

## 2014-04-15 ENCOUNTER — Encounter (INDEPENDENT_AMBULATORY_CARE_PROVIDER_SITE_OTHER): Payer: Self-pay | Admitting: Surgery

## 2014-04-15 VITALS — BP 134/78 | HR 75 | Temp 98.5°F | Resp 14 | Ht 61.0 in | Wt 157.4 lb

## 2014-04-15 DIAGNOSIS — Z853 Personal history of malignant neoplasm of breast: Secondary | ICD-10-CM

## 2014-04-15 NOTE — Patient Instructions (Signed)
Thanks for your patience.  If you need further assistance after leaving the office, please call our office and speak with a [facility] nurse.  [phone].  If you want to leave a message for Dr. Idriss Quackenbush, please call his office phone at [phone]. 

## 2014-04-15 NOTE — Progress Notes (Signed)
Forest Gleason 79 y.o.  Body mass index is 29.76 kg/(m^2).  Patient Active Problem List   Diagnosis Date Noted  . Right mastectomy 1999 03/26/2012    No Known Allergies  Past Surgical History  Procedure Laterality Date  . Tonsillectomy    . Abdominal hysterectomy    . Appendectomy    . Joint replacement      lt total knee 2011  . Eye surgery      cataracts  . Lipoma excision  11/28/2011    Procedure: EXCISION LIPOMA;  Surgeon: Pedro Earls, MD;  Location: [place];  Service: General;  Laterality: N/A;  excisino lipoma 4 cm back of neck  . Breast surgery      Right mastectomy 1999   MITCHELL,DEAN, MD No diagnosis found.  Mammogram in Feb was negative.  Right chest wall flaps are without masses or suspicious areas.  No lymphedema in the right arm.  The left breast is large, pendulous and without masses.  No axillary masses.  No supraclavicular masses.   Doing well.  Return in one year after mammogram.   Matt B. Hassell Done, MD, [facility] [phone] beeper [phone]  04/15/2014 10:46 AM

## 2014-09-13 ENCOUNTER — Other Ambulatory Visit: Payer: Self-pay | Admitting: Orthopedic Surgery

## 2014-09-24 ENCOUNTER — Encounter (HOSPITAL_BASED_OUTPATIENT_CLINIC_OR_DEPARTMENT_OTHER): Payer: Self-pay | Admitting: *Deleted

## 2014-09-24 NOTE — Progress Notes (Signed)
To come in for ekg-bmet 

## 2014-09-27 ENCOUNTER — Encounter (HOSPITAL_BASED_OUTPATIENT_CLINIC_OR_DEPARTMENT_OTHER)
Admission: RE | Admit: 2014-09-27 | Discharge: 2014-09-27 | Disposition: A | Discharge status: Home / Self Care | Payer: Medicare Other | Source: Ambulatory Visit | Attending: Orthopedic Surgery | Admitting: Orthopedic Surgery

## 2014-09-27 DIAGNOSIS — I10 Essential (primary) hypertension: Secondary | ICD-10-CM | POA: Insufficient documentation

## 2014-09-27 DIAGNOSIS — Z0000 Encounter for general adult medical examination without abnormal findings: Secondary | ICD-10-CM | POA: Insufficient documentation

## 2014-09-27 DIAGNOSIS — Z9010 Acquired absence of unspecified breast and nipple: Secondary | ICD-10-CM | POA: Diagnosis not present

## 2014-09-27 DIAGNOSIS — M1990 Unspecified osteoarthritis, unspecified site: Secondary | ICD-10-CM | POA: Insufficient documentation

## 2014-09-27 DIAGNOSIS — E785 Hyperlipidemia, unspecified: Secondary | ICD-10-CM | POA: Insufficient documentation

## 2014-09-27 DIAGNOSIS — Z79899 Other long term (current) drug therapy: Secondary | ICD-10-CM | POA: Diagnosis not present

## 2014-09-27 DIAGNOSIS — Z8673 Personal history of transient ischemic attack (TIA), and cerebral infarction without residual deficits: Secondary | ICD-10-CM | POA: Diagnosis not present

## 2014-09-27 DIAGNOSIS — Z853 Personal history of malignant neoplasm of breast: Secondary | ICD-10-CM | POA: Insufficient documentation

## 2014-09-27 DIAGNOSIS — E119 Type 2 diabetes mellitus without complications: Secondary | ICD-10-CM | POA: Diagnosis not present

## 2014-09-27 DIAGNOSIS — G5601 Carpal tunnel syndrome, right upper limb: Secondary | ICD-10-CM | POA: Insufficient documentation

## 2014-09-27 DIAGNOSIS — H269 Unspecified cataract: Secondary | ICD-10-CM | POA: Diagnosis not present

## 2014-09-27 LAB — BASIC METABOLIC PANEL WITH GFR
Anion gap: 13 (ref 5–15)
BUN: 11 mg/dL (ref 6–23)
CO2: 30 meq/L (ref 19–32)
Calcium: 10.3 mg/dL (ref 8.4–10.5)
Chloride: 99 meq/L (ref 96–112)
Creatinine, Ser: 0.68 mg/dL (ref 0.50–1.10)
GFR calc Af Amer: >90 mL/min
GFR calc non Af Amer: 82 mL/min — ABNORMAL LOW
Glucose, Bld: 234 mg/dL — ABNORMAL HIGH (ref 70–99)
Potassium: 3.6 meq/L — ABNORMAL LOW (ref 3.7–5.3)
Sodium: 142 meq/L (ref 137–147)

## 2014-09-27 NOTE — Progress Notes (Signed)
EKG reviewed by Dr Al Corpus. Pt ok for surgery

## 2014-09-30 ENCOUNTER — Ambulatory Visit (HOSPITAL_BASED_OUTPATIENT_CLINIC_OR_DEPARTMENT_OTHER): Payer: Medicare Other | Admitting: Anesthesiology

## 2014-09-30 ENCOUNTER — Encounter (HOSPITAL_BASED_OUTPATIENT_CLINIC_OR_DEPARTMENT_OTHER)
Admission: RE | Disposition: A | Discharge status: Home / Self Care | Payer: Self-pay | Source: Ambulatory Visit | Attending: Orthopedic Surgery

## 2014-09-30 ENCOUNTER — Ambulatory Visit (HOSPITAL_BASED_OUTPATIENT_CLINIC_OR_DEPARTMENT_OTHER)
Admission: RE | Admit: 2014-09-30 | Discharge: 2014-09-30 | Disposition: A | Discharge status: Home / Self Care | Payer: Medicare Other | Source: Ambulatory Visit | Attending: Orthopedic Surgery | Admitting: Orthopedic Surgery

## 2014-09-30 ENCOUNTER — Encounter (HOSPITAL_BASED_OUTPATIENT_CLINIC_OR_DEPARTMENT_OTHER): Payer: Medicare Other | Admitting: Anesthesiology

## 2014-09-30 ENCOUNTER — Encounter (HOSPITAL_BASED_OUTPATIENT_CLINIC_OR_DEPARTMENT_OTHER): Payer: Self-pay | Admitting: Orthopedic Surgery

## 2014-09-30 DIAGNOSIS — Z853 Personal history of malignant neoplasm of breast: Secondary | ICD-10-CM | POA: Insufficient documentation

## 2014-09-30 DIAGNOSIS — G5601 Carpal tunnel syndrome, right upper limb: Secondary | ICD-10-CM | POA: Diagnosis not present

## 2014-09-30 DIAGNOSIS — Z79899 Other long term (current) drug therapy: Secondary | ICD-10-CM | POA: Insufficient documentation

## 2014-09-30 DIAGNOSIS — Z8673 Personal history of transient ischemic attack (TIA), and cerebral infarction without residual deficits: Secondary | ICD-10-CM | POA: Insufficient documentation

## 2014-09-30 DIAGNOSIS — E785 Hyperlipidemia, unspecified: Secondary | ICD-10-CM | POA: Diagnosis not present

## 2014-09-30 DIAGNOSIS — I10 Essential (primary) hypertension: Secondary | ICD-10-CM | POA: Diagnosis not present

## 2014-09-30 DIAGNOSIS — E119 Type 2 diabetes mellitus without complications: Secondary | ICD-10-CM | POA: Insufficient documentation

## 2014-09-30 DIAGNOSIS — M1990 Unspecified osteoarthritis, unspecified site: Secondary | ICD-10-CM | POA: Insufficient documentation

## 2014-09-30 DIAGNOSIS — H269 Unspecified cataract: Secondary | ICD-10-CM | POA: Insufficient documentation

## 2014-09-30 DIAGNOSIS — Z9010 Acquired absence of unspecified breast and nipple: Secondary | ICD-10-CM | POA: Insufficient documentation

## 2014-09-30 HISTORY — PX: CARPAL TUNNEL RELEASE: SHX101

## 2014-09-30 LAB — GLUCOSE, CAPILLARY: Glucose-Capillary: 98 mg/dL (ref 70–99)

## 2014-09-30 SURGERY — CARPAL TUNNEL RELEASE
Anesthesia: Monitor Anesthesia Care | Site: Wrist | Laterality: Right

## 2014-09-30 MED ORDER — BUPIVACAINE HCL (PF) 0.25 % IJ SOLN
INTRAMUSCULAR | Status: DC | PRN
  Administered 2014-09-30: 8 mL

## 2014-09-30 MED ORDER — OXYCODONE HCL 5 MG PO TABS
ORAL_TABLET | ORAL | Status: AC
  Filled 2014-09-30: qty 1

## 2014-09-30 MED ORDER — LACTATED RINGERS IV SOLN
INTRAVENOUS | Status: DC
  Administered 2014-09-30: 10:00:00 via INTRAVENOUS

## 2014-09-30 MED ORDER — CHLORHEXIDINE GLUCONATE 4 % EX LIQD: 60.0000 mL | Freq: Once | CUTANEOUS | Status: DC

## 2014-09-30 MED ORDER — HYDROCODONE-ACETAMINOPHEN 5-325 MG PO TABS: 1.0000 | ORAL_TABLET | Freq: Four times a day (QID) | ORAL | Status: DC | PRN

## 2014-09-30 MED ORDER — PROPOFOL 10 MG/ML IV BOLUS
INTRAVENOUS | Status: DC | PRN
  Administered 2014-09-30: 150 mg via INTRAVENOUS

## 2014-09-30 MED ORDER — FENTANYL CITRATE 0.05 MG/ML IJ SOLN
INTRAMUSCULAR | Status: DC | PRN
  Administered 2014-09-30 (×2): 50 ug via INTRAVENOUS

## 2014-09-30 MED ORDER — OXYCODONE HCL 5 MG/5ML PO SOLN: 5.0000 mg | Freq: Once | ORAL | Status: AC | PRN

## 2014-09-30 MED ORDER — FENTANYL CITRATE 0.05 MG/ML IJ SOLN
INTRAMUSCULAR | Status: AC
  Filled 2014-09-30: qty 2

## 2014-09-30 MED ORDER — MIDAZOLAM HCL 2 MG/2ML IJ SOLN: 1.0000 mg | INTRAMUSCULAR | Status: DC | PRN

## 2014-09-30 MED ORDER — FENTANYL CITRATE 0.05 MG/ML IJ SOLN: 50.0000 ug | INTRAMUSCULAR | Status: DC | PRN

## 2014-09-30 MED ORDER — OXYCODONE HCL 5 MG PO TABS
5.0000 mg | ORAL_TABLET | Freq: Once | ORAL | Status: AC | PRN
  Administered 2014-09-30: 5 mg via ORAL

## 2014-09-30 MED ORDER — FENTANYL CITRATE 0.05 MG/ML IJ SOLN
INTRAMUSCULAR | Status: AC
  Filled 2014-09-30: qty 4

## 2014-09-30 MED ORDER — CEFAZOLIN SODIUM-DEXTROSE 2-3 GM-% IV SOLR
INTRAVENOUS | Status: AC
  Filled 2014-09-30: qty 50

## 2014-09-30 MED ORDER — ONDANSETRON HCL 4 MG/2ML IJ SOLN: 4.0000 mg | Freq: Once | INTRAMUSCULAR | Status: DC | PRN

## 2014-09-30 MED ORDER — ONDANSETRON HCL 4 MG/2ML IJ SOLN
INTRAMUSCULAR | Status: DC | PRN
  Administered 2014-09-30: 4 mg via INTRAVENOUS

## 2014-09-30 MED ORDER — CEFAZOLIN SODIUM-DEXTROSE 2-3 GM-% IV SOLR: 2.0000 g | INTRAVENOUS | Status: DC

## 2014-09-30 MED ORDER — LIDOCAINE HCL (CARDIAC) 20 MG/ML IV SOLN
INTRAVENOUS | Status: DC | PRN
  Administered 2014-09-30: 50 mg via INTRAVENOUS

## 2014-09-30 MED ORDER — CEFAZOLIN SODIUM-DEXTROSE 2-3 GM-% IV SOLR
2.0000 g | INTRAVENOUS | Status: AC
Start: 2014-09-30 — End: 2014-09-30
  Administered 2014-09-30: 2 g via INTRAVENOUS

## 2014-09-30 MED ORDER — FENTANYL CITRATE 0.05 MG/ML IJ SOLN
25.0000 ug | INTRAMUSCULAR | Status: DC | PRN
  Administered 2014-09-30: 25 ug via INTRAVENOUS

## 2014-09-30 SURGICAL SUPPLY — 37 items
BANDAGE COBAN STERILE 2 (GAUZE/BANDAGES/DRESSINGS) ×1 IMPLANT
BLADE SURG 15 STRL LF DISP TIS (BLADE) ×1 IMPLANT
BLADE SURG 15 STRL SS (BLADE) ×2
BNDG CMPR 9X4 STRL LF SNTH (GAUZE/BANDAGES/DRESSINGS) ×1
BNDG COHESIVE 3X5 TAN STRL LF (GAUZE/BANDAGES/DRESSINGS) ×2 IMPLANT
BNDG ESMARK 4X9 LF (GAUZE/BANDAGES/DRESSINGS) ×2 IMPLANT
BNDG GAUZE ELAST 4 BULKY (GAUZE/BANDAGES/DRESSINGS) ×2 IMPLANT
CHLORAPREP W/TINT 26ML (MISCELLANEOUS) ×2 IMPLANT
CORDS BIPOLAR (ELECTRODE) ×2 IMPLANT
COVER BACK TABLE 60X90IN (DRAPES) ×2 IMPLANT
COVER MAYO STAND STRL (DRAPES) ×2 IMPLANT
CUFF TOURNIQUET SINGLE 18IN (TOURNIQUET CUFF) ×2 IMPLANT
DRAPE EXTREMITY TIBURON (DRAPES) ×2 IMPLANT
DRAPE SURG 17X23 STRL (DRAPES) ×2 IMPLANT
DRSG PAD ABDOMINAL 8X10 ST (GAUZE/BANDAGES/DRESSINGS) ×2 IMPLANT
GAUZE SPONGE 4X4 12PLY STRL (GAUZE/BANDAGES/DRESSINGS) ×2 IMPLANT
GAUZE XEROFORM 1X8 LF (GAUZE/BANDAGES/DRESSINGS) ×2 IMPLANT
GLOVE BIOGEL PI IND STRL 7.0 (GLOVE) IMPLANT
GLOVE BIOGEL PI IND STRL 8.5 (GLOVE) ×1 IMPLANT
GLOVE BIOGEL PI INDICATOR 7.0 (GLOVE) ×1
GLOVE BIOGEL PI INDICATOR 8.5 (GLOVE) ×1
GLOVE ECLIPSE 6.5 STRL STRAW (GLOVE) ×2 IMPLANT
GLOVE SURG ORTHO 8.0 STRL STRW (GLOVE) ×2 IMPLANT
GOWN STRL REUS W/ TWL LRG LVL3 (GOWN DISPOSABLE) ×1 IMPLANT
GOWN STRL REUS W/TWL LRG LVL3 (GOWN DISPOSABLE) ×2
GOWN STRL REUS W/TWL XL LVL3 (GOWN DISPOSABLE) ×2 IMPLANT
NEEDLE 27GAX1X1/2 (NEEDLE) ×1 IMPLANT
NS IRRIG 1000ML POUR BTL (IV SOLUTION) ×2 IMPLANT
PACK BASIN DAY SURGERY FS (CUSTOM PROCEDURE TRAY) ×2 IMPLANT
SPONGE GAUZE 4X4 12PLY STER LF (GAUZE/BANDAGES/DRESSINGS) ×1 IMPLANT
STOCKINETTE 4X48 STRL (DRAPES) ×2 IMPLANT
SUT VICRYL 4-0 PS2 18IN ABS (SUTURE) IMPLANT
SUT VICRYL RAPIDE 4/0 PS 2 (SUTURE) ×2 IMPLANT
SYR BULB 3OZ (MISCELLANEOUS) ×2 IMPLANT
SYR CONTROL 10ML LL (SYRINGE) ×1 IMPLANT
TOWEL OR 17X24 6PK STRL BLUE (TOWEL DISPOSABLE) ×2 IMPLANT
UNDERPAD 30X30 INCONTINENT (UNDERPADS AND DIAPERS) ×2 IMPLANT

## 2014-09-30 NOTE — Brief Op Note (Signed)
09/30/2014  11:17 AM  PATIENT:  Amanda Green  79 y.o. female  PRE-OPERATIVE DIAGNOSIS:  RIGHT CARPAL TUNNEL SYNDROME  POST-OPERATIVE DIAGNOSIS:  RIGHT CARPAL TUNNEL SYNDROME  PROCEDURE:  Procedure(s): RIGHT CARPAL TUNNEL RELEASE (Right)  SURGEON:  Surgeon(s) and Role:    * Daryll Brod, MD - Primary  PHYSICIAN ASSISTANT:   ASSISTANTS: none   ANESTHESIA:   local and general  EBL:     BLOOD ADMINISTERED:none  DRAINS: none   LOCAL MEDICATIONS USED:  BUPIVICAINE   SPECIMEN:  No Specimen  DISPOSITION OF SPECIMEN:  N/A  COUNTS:  YES  TOURNIQUET:  * Missing tourniquet times found for documented tourniquets in log:  [id number] *  DICTATION: .Other Dictation: Dictation Number [id number]  PLAN OF CARE: Discharge to home after PACU  PATIENT DISPOSITION:  PACU - hemodynamically stable.

## 2014-09-30 NOTE — Discharge Instructions (Addendum)

## 2014-09-30 NOTE — H&P (Signed)
Amanda Green is a 79 year-old right-hand dominant former patient who comes in complaining of right hand pain for the past 2-3 weeks. She has no history of injury.  She localizes this on the volar aspect of her wrist and forearm. She has history of diabetes, no history of thyroid problems, arthritis or gout.  She is status post trigger thumb release by myself multiple years ago.  She is awakened 7/7 nights.  She has taken ibuprofen with little relief. She complains of an intermittent moderate aching pain with a feeling of numbness.  She states it is getting worse.  Work makes this worse, elevation helps.  She has history of diabetes, no history of thyroid problems, arthritis or gout.    Family history negative for each. She has positive nerve conductions for carpal tunnel syndrome.  ALLERGIES:   None.  MEDICATIONS:    Metformin, glyburide, amlodipine, simvastatin, HCTZ.  SURGICAL HISTORY:    Mastectomy and trigger thumb.  FAMILY MEDICAL HISTORY:    Negative.  SOCIAL HISTORY:     She does not smoke or drink.  She is retired.  REVIEW OF SYSTEMS:    Positive for glasses, cataracts, high blood pressure, stroke, otherwise negative 14 points.  Amanda Green is an 79 y.o. female.   Chief Complaint: right carpal tunnel syndrome HPI: see above  Past Medical History  Diagnosis Date  . Hyperlipidemia   . Hypertension   . Diabetes mellitus   . Cancer     breast  . Arthritis     Past Surgical History  Procedure Laterality Date  . Abdominal hysterectomy    . Appendectomy    . Joint replacement      lt total knee 2011  . Eye surgery      cataracts  . Lipoma excision  11/28/2011    Procedure: EXCISION LIPOMA;  Surgeon: Pedro Earls, MD;  Location: [place];  Service: General;  Laterality: N/A;  excisino lipoma 4 cm back of neck  . Breast surgery      Right mastectomy 1999  . Colonoscopy      History reviewed. No pertinent family history. Social History:  reports that  she has never smoked. She has never used smokeless tobacco. She reports that she does not drink alcohol or use illicit drugs.  Allergies: No Known Allergies  Medications Prior to Admission  Medication Sig Dispense Refill  . Calcium Carbonate-Vitamin D (CALTRATE 600+D PO) Take by mouth.      . Multiple Vitamins-Minerals (CENTRUM ADULTS PO) Take by mouth.      Marland Kitchen amLODipine (NORVASC) 10 MG tablet       . cloNIDine (CATAPRES) 0.1 MG tablet 0.1 mg daily.       Marland Kitchen glimepiride (AMARYL) 2 MG tablet       . glipiZIDE (GLUCOTROL XL) 5 MG 24 hr tablet       . hydrochlorothiazide (HYDRODIURIL) 25 MG tablet       . latanoprost (XALATAN) 0.005 % ophthalmic solution       . metFORMIN (GLUCOPHAGE) 500 MG tablet 500 mg 2 (two) times daily with a meal.       . simvastatin (ZOCOR) 80 MG tablet       . UNABLE TO FIND Rx: L8000- Post surgical bras (Quantity: 6) T2458- Silicone Breast Prosthesis (Quantity: 1) Dx: 174.9; Right partial mastectomy  1 each  0    No results found for this or any previous visit (from the past 48 hour(s)).  No  results found.   Pertinent items are noted in HPI.  Height 5\' 1"  (1.549 m), weight 70.761 kg (156 lb).  General appearance: alert, cooperative and appears stated age Head: Normocephalic, without obvious abnormality Neck: no JVD Resp: clear to auscultation bilaterally Cardio: regular rate and rhythm, S1, S2 normal, no murmur, click, rub or gallop GI: soft, non-tender; bowel sounds normal; no masses,  no organomegaly Extremities: Numbness and tingling right hand Pulses: 2+ and symmetric Skin: Skin color, texture, turgor normal. No rashes or lesions Neurologic: Grossly normal Incision/Wound: na  Assessment/Plan RADIOGRAPHS:     X-rays of her hand reveal degenerative arthritis at DIP joints, MP joint of her thumb.  DIAGNOSIS:      Degenerative arthritis multiple joints without symptoms, right carpal tunnel syndrome.    Plan: Carpal tunnel release right We have  discussed the possibility of surgical intervention with respect to the carpal tunnel.  We would not rush her into any intervention with respect to the [facility] arthritis. The pre, peri and post op course are discussed along with risks and complications.  She is aware there is no guarantee with surgery, possibility of infection, recurrence, injury to arteries, nerves and tendons, incomplete relief of symptoms and dystrophy.  She is scheduled for right carpal tunnel release as an outpatient under regional anesthesia.  Davison Ohms R 09/30/2014, 9:28 AM

## 2014-09-30 NOTE — Transfer of Care (Signed)
Immediate Anesthesia Transfer of Care Note  Patient: Amanda Green  Procedure(s) Performed: Procedure(s): RIGHT CARPAL TUNNEL RELEASE (Right)  Patient Location: PACU  Anesthesia Type:General  Level of Consciousness: awake, alert , oriented and patient cooperative  Airway & Oxygen Therapy: Patient Spontanous Breathing and Patient connected to face mask oxygen  Post-op Assessment: Report given to PACU RN and Post -op Vital signs reviewed and stable  Post vital signs: Reviewed and stable  Complications: No apparent anesthesia complications

## 2014-09-30 NOTE — Anesthesia Postprocedure Evaluation (Signed)
  Anesthesia Post-op Note  Patient: Amanda Green  Procedure(s) Performed: Procedure(s): RIGHT CARPAL TUNNEL RELEASE (Right)  Patient Location: PACU  Anesthesia Type: MAC, Bier Block   Level of Consciousness: awake, alert  and oriented  Airway and Oxygen Therapy: Patient Spontanous Breathing  Post-op Pain: moderate  Post-op Assessment: Post-op Vital signs reviewed  Post-op Vital Signs: Reviewed  Last Vitals:  Filed Vitals:   09/30/14 1200  BP: 159/76  Pulse: 78  Temp:   Resp: 25    Complications: No apparent anesthesia complications

## 2014-09-30 NOTE — Op Note (Signed)
Dictation Number [id number]

## 2014-09-30 NOTE — Op Note (Signed)
NAMEEZMA, REHM                 ACCOUNT NO.:  [id number]  MEDICAL RECORD NO.:  [id number]  LOCATION:                                 FACILITY:  PHYSICIAN:  Daryll Brod, M.D.            DATE OF BIRTH:  DATE OF PROCEDURE:  09/30/2014 DATE OF DISCHARGE:                              OPERATIVE REPORT   PREOPERATIVE DIAGNOSIS:  Carpal tunnel syndrome, right hand.  POSTOPERATIVE DIAGNOSIS:  Carpal tunnel syndrome, right hand.  OPERATION:  Decompression of right median nerve.  SURGEON:  Daryll Brod, MD.  ANESTHESIA:  General with local infiltration.  ANESTHESIOLOGIST:  Lorrene Reid, M.D.  HISTORY:  The patient is a 79 year old female with a history of carpal tunnel syndrome.  Nerve conductions are positive.  She is admitted now for release of the right carpal canal.  Pre, peri, and postoperative course have been discussed along with risks and complications.  She is aware that there is no guarantee with the surgery, possibility of infection, recurrence of injury to arteries, nerves, tendons, incomplete relief of symptoms, dystrophy.  In preoperative area, the patient was seen, the extremity marked by both patient and surgeon.  Antibiotic given.  PROCEDURE IN DETAIL:  The patient was brought to the operating room where general anesthetic was carried out without difficulty.  She was prepped using ChloraPrep in supine position with right arm free.  A 3- minute dry time was allowed.  Time-out taken confirming the patient and procedure.  A longitudinal incision was made in the right palm, carried down through subcutaneous tissue.  Bleeders were electrocauterized. Palmar fascia was split.  Superficial palmar arch identified.  Flexor tendon to the ring and little finger identified to the ulnar side of median nerve.  Carpal retinaculum was incised with sharp dissection. Right angle and Sewall retractor were placed between skin and forearm fascia.  The fascia was released for approximately  1.5 cm to 2 cm proximal to the wrist crease under direct vision.  Canal was explored. Tenosynovitis was present.  No further lesions were identified.  Air compression to the nerve was apparent.  Motor branch entered into muscle.  The wound was copiously irrigated with saline.  The skin was closed with interrupted 4-0 Vicryl deep sutures.  Local infiltration with 0.25% Marcaine without epinephrine was given, approximately 8 mL was used.  Sterile compressive dressing with the fingers free was applied.  On deflation of the tourniquet, all fingers immediately pinked.  She was taken to the recovery room for observation in satisfactory condition.  She will be discharged home to return to the [facility] in 1 week on Vicodin.          ______________________________ Daryll Brod, M.D.     GK/MEDQ  D:  09/30/2014  T:  09/30/2014  Job:  [id number]

## 2014-09-30 NOTE — Anesthesia Procedure Notes (Signed)
Procedure Name: LMA Insertion Date/Time: 09/30/2014 10:49 AM Performed by: Lyndee Leo Pre-anesthesia Checklist: Patient identified, Emergency Drugs available, Suction available and Patient being monitored Patient Re-evaluated:Patient Re-evaluated prior to inductionOxygen Delivery Method: Circle System Utilized Preoxygenation: Pre-oxygenation with 100% oxygen Intubation Type: IV induction Ventilation: Mask ventilation without difficulty LMA: LMA inserted LMA Size: 4.0 Number of attempts: 1 Airway Equipment and Method: bite block Placement Confirmation: positive ETCO2 Tube secured with: Tape Dental Injury: Teeth and Oropharynx as per pre-operative assessment

## 2014-09-30 NOTE — Anesthesia Preprocedure Evaluation (Signed)
Anesthesia Evaluation  Patient identified by MRN, date of birth, ID band Patient awake    Reviewed: Allergy & Precautions, H&P , NPO status , Patient's Chart, lab work & pertinent test results  Airway Mallampati: I TM Distance: >3 FB Neck ROM: Full    Dental  (+) Teeth Intact, Dental Advisory Given   Pulmonary  breath sounds clear to auscultation        Cardiovascular hypertension, Pt. on medications Rhythm:Regular Rate:Normal     Neuro/Psych    GI/Hepatic   Endo/Other  diabetes, Well Controlled, Type 2, Oral Hypoglycemic Agents  Renal/GU      Musculoskeletal   Abdominal   Peds  Hematology   Anesthesia Other Findings   Reproductive/Obstetrics                           Anesthesia Physical Anesthesia Plan  ASA: III  Anesthesia Plan: MAC and Bier Block   Post-op Pain Management:    Induction: Intravenous  Airway Management Planned: Simple Face Mask  Additional Equipment:   Intra-op Plan:   Post-operative Plan:   Informed Consent: I have reviewed the patients History and Physical, chart, labs and discussed the procedure including the risks, benefits and alternatives for the proposed anesthesia with the patient or authorized representative who has indicated his/her understanding and acceptance.   Dental advisory given  Plan Discussed with: CRNA, Anesthesiologist and Surgeon  Anesthesia Plan Comments:         Anesthesia Quick Evaluation

## 2014-10-04 ENCOUNTER — Encounter (HOSPITAL_BASED_OUTPATIENT_CLINIC_OR_DEPARTMENT_OTHER): Payer: Self-pay | Admitting: Orthopedic Surgery

## 2014-12-30 ENCOUNTER — Ambulatory Visit (HOSPITAL_COMMUNITY): Payer: Medicare Other

## 2015-01-18 ENCOUNTER — Ambulatory Visit (HOSPITAL_COMMUNITY): Payer: Medicare Other

## 2015-02-15 ENCOUNTER — Other Ambulatory Visit (INDEPENDENT_AMBULATORY_CARE_PROVIDER_SITE_OTHER): Payer: Self-pay | Admitting: Surgery

## 2015-02-15 ENCOUNTER — Ambulatory Visit (HOSPITAL_COMMUNITY)
Admission: RE | Admit: 2015-02-15 | Discharge: 2015-02-15 | Disposition: A | Discharge status: Home / Self Care | Payer: Medicare Other | Source: Ambulatory Visit | Attending: Surgery | Admitting: Surgery

## 2015-02-15 DIAGNOSIS — Z1231 Encounter for screening mammogram for malignant neoplasm of breast: Secondary | ICD-10-CM

## 2015-11-24 DIAGNOSIS — M25511 Pain in right shoulder: Secondary | ICD-10-CM | POA: Insufficient documentation

## 2015-11-24 DIAGNOSIS — G8929 Other chronic pain: Secondary | ICD-10-CM | POA: Insufficient documentation

## 2016-01-09 ENCOUNTER — Other Ambulatory Visit: Payer: Self-pay

## 2016-01-09 DIAGNOSIS — Z853 Personal history of malignant neoplasm of breast: Secondary | ICD-10-CM

## 2016-01-09 DIAGNOSIS — Z1231 Encounter for screening mammogram for malignant neoplasm of breast: Secondary | ICD-10-CM

## 2016-01-09 DIAGNOSIS — Z9011 Acquired absence of right breast and nipple: Secondary | ICD-10-CM

## 2016-02-16 ENCOUNTER — Ambulatory Visit
Admission: RE | Admit: 2016-02-16 | Discharge: 2016-02-16 | Disposition: A | Discharge status: Home / Self Care | Payer: Medicare Other | Source: Ambulatory Visit

## 2016-02-16 DIAGNOSIS — Z853 Personal history of malignant neoplasm of breast: Secondary | ICD-10-CM

## 2016-02-16 DIAGNOSIS — Z9011 Acquired absence of right breast and nipple: Secondary | ICD-10-CM

## 2016-02-16 DIAGNOSIS — Z1231 Encounter for screening mammogram for malignant neoplasm of breast: Secondary | ICD-10-CM

## 2016-06-14 ENCOUNTER — Other Ambulatory Visit: Payer: Self-pay | Admitting: Gastroenterology

## 2016-06-27 ENCOUNTER — Encounter: Payer: Self-pay | Admitting: Podiatry

## 2016-06-27 ENCOUNTER — Ambulatory Visit (INDEPENDENT_AMBULATORY_CARE_PROVIDER_SITE_OTHER): Payer: Medicare Other | Admitting: Podiatry

## 2016-06-27 VITALS — BP 151/86 | HR 83 | Resp 14

## 2016-06-27 DIAGNOSIS — L84 Corns and callosities: Secondary | ICD-10-CM | POA: Diagnosis not present

## 2016-06-27 NOTE — Progress Notes (Signed)
   Subjective:    Patient ID: Amanda Green, female    DOB: 1935-02-26, 81 y.o.   MRN: [id number]  HPI this patient presents the office with chief complaint of calluses on the outside of her left foot as well as requesting a foot exam since this patient is diabetic. Patient has calluses noted on the outside border of her left foot as well as a callus under her big toe left foot. This patient states that she is diabetic and keeps her blood sugar about 120-130. She presents the office today for an evaluation and treatment of this condition    Review of Systems  All other systems reviewed and are negative.      Objective:   Physical Exam GENERAL APPEARANCE: Alert, conversant. Appropriately groomed. No acute distress.  VASCULAR: Pedal pulses are  palpable at  [facility] and PT bilateral.  Capillary refill time is immediate to all digits,  Normal temperature gradient.   NEUROLOGIC: sensation is normal to 5.07 monofilament at 5/5 sites bilateral.  Light touch is intact bilateral, Muscle strength normal.  MUSCULOSKELETAL: acceptable muscle strength, tone and stability bilateral.  Intrinsic muscluature intact bilateral.  Rectus appearance of foot and digits noted bilateral. Severe HAV 1st MPJ  B/L  DERMATOLOGIC: skin color, texture, and turgor are within normal limits.  No preulcerative lesions or ulcers  are seen, no interdigital maceration noted.  No open lesions present.  Digital nails are asymptomatic. No drainage noted.         Assessment & Plan:  Callus left foot.  Diabetic with no complications.  IE  Debride callus.  Foot exam performed.  RTC 4 months   Gardiner Barefoot DPM

## 2016-07-05 ENCOUNTER — Ambulatory Visit: Payer: Self-pay | Admitting: Surgery

## 2016-07-05 NOTE — H&P (Signed)
Chief Complaint:  Carpeting adenomatous polyp of the cecum  History of Present Illness:  Amanda Green is an 81 y.o. female who underwent a colonoscopy and was found to have a carpeting polyp at the appendiceal orifice.  This is not resectable by colonoscopy.  She was seen in the office and informed consent was obtained regarding lap/robotic assisted right hemicolectomy.  She wants to proceed  Past Medical History  Diagnosis Date  . Hyperlipidemia   . Hypertension   . Diabetes mellitus   . Cancer [facility])     breast  . Arthritis     Past Surgical History  Procedure Laterality Date  . Abdominal hysterectomy    . Appendectomy    . Joint replacement      lt total knee 2011  . Eye surgery      cataracts  . Lipoma excision  11/28/2011    Procedure: EXCISION LIPOMA;  Surgeon: Pedro Earls, MD;  Location: [place];  Service: General;  Laterality: N/A;  excisino lipoma 4 cm back of neck  . Breast surgery      Right mastectomy 1999  . Colonoscopy    . Carpal tunnel release Right 09/30/2014    Procedure: RIGHT CARPAL TUNNEL RELEASE;  Surgeon: Daryll Brod, MD;  Location: [place];  Service: Orthopedics;  Laterality: Right;    Current Outpatient Prescriptions  Medication Sig Dispense Refill  . amLODipine (NORVASC) 10 MG tablet     . atorvastatin (LIPITOR) 40 MG tablet     . Calcium Carbonate-Vitamin D (CALTRATE 600+D PO) Take by mouth.    . cloNIDine (CATAPRES) 0.1 MG tablet 0.1 mg daily.     Marland Kitchen GAVILYTE-N WITH FLAVOR PACK 420 g solution     . glimepiride (AMARYL) 2 MG tablet     . glipiZIDE (GLUCOTROL XL) 5 MG 24 hr tablet     . hydrochlorothiazide (HYDRODIURIL) 25 MG tablet     . HYDROcodone-acetaminophen (NORCO) 5-325 MG per tablet Take 1 tablet by mouth every 6 (six) hours as needed for moderate pain. 30 tablet 0  . latanoprost (XALATAN) 0.005 % ophthalmic solution     . metFORMIN (GLUCOPHAGE) 500 MG tablet 500 mg 2 (two) times daily with a meal.      . Multiple Vitamins-Minerals (CENTRUM ADULTS PO) Take by mouth.    . simvastatin (ZOCOR) 80 MG tablet     . UNABLE TO FIND Rx: L8000- Post surgical bras (Quantity: 6) [id number]- Silicone Breast Prosthesis (Quantity: 1) Dx: 174.9; Right partial mastectomy 1 each 0   No current facility-administered medications for this visit.   Review of patient's allergies indicates no known allergies. No family history on file. Social History:   reports that she has never smoked. She has never used smokeless tobacco. She reports that she does not drink alcohol or use illicit drugs.   REVIEW OF SYSTEMS : Negative except for see problem list.  Her husband is undergoing chemotx for lung cancer  Physical Exam:   There were no vitals taken for this visit. There is no weight on file to calculate BMI.  Gen:  WDWN AAF NAD  Neurological: Alert and oriented to person, place, and time. Motor and sensory function is grossly intact  Head: Normocephalic and atraumatic.  Eyes: Conjunctivae are normal. Pupils are equal, round, and reactive to light. No scleral icterus.  Neck: Normal range of motion. Neck supple. No tracheal deviation or thyromegaly present.  Cardiovascular:  SR without murmurs or gallops.  No carotid bruits Breast:  Right breast mastectomy by me in 1999.  Left breast without masses Respiratory: Effort normal.  No respiratory distress. No chest wall tenderness. Breath sounds normal.  No wheezes, rales or rhonchi.  Abdomen:  Soft and nontender.  Prior hysterectomy GU:  Not evaluated Musculoskeletal: Normal range of motion. Extremities are nontender. No cyanosis, edema or clubbing noted Lymphadenopathy: No cervical, preauricular, postauricular or axillary adenopathy is present Skin: Skin is warm and dry. No rash noted. No diaphoresis. No erythema. No pallor. Pscyh: Normal mood and affect. Behavior is normal. Judgment and thought content normal.   LABORATORY RESULTS: No results found for this or any  previous visit (from the past 48 hour(s)).   RADIOLOGY RESULTS: No results found.  Problem List: Patient Active Problem List   Diagnosis Date Noted  . Right mastectomy 1999 03/26/2012    Assessment & Plan: Cecal polyp for right colectomy.      Matt B. Hassell Done, MD, [facility] [phone] beeper [phone]  07/05/2016 10:52 AM

## 2016-08-09 ENCOUNTER — Emergency Department (HOSPITAL_COMMUNITY): Payer: Medicare Other

## 2016-08-09 ENCOUNTER — Emergency Department (HOSPITAL_COMMUNITY)
Admission: EM | Admit: 2016-08-09 | Discharge: 2016-08-10 | Disposition: A | Discharge status: Home / Self Care | Payer: Medicare Other | Attending: Emergency Medicine | Admitting: Emergency Medicine

## 2016-08-09 ENCOUNTER — Encounter (HOSPITAL_COMMUNITY): Payer: Self-pay | Admitting: Emergency Medicine

## 2016-08-09 DIAGNOSIS — Z853 Personal history of malignant neoplasm of breast: Secondary | ICD-10-CM | POA: Diagnosis not present

## 2016-08-09 DIAGNOSIS — S43015A Anterior dislocation of left humerus, initial encounter: Secondary | ICD-10-CM | POA: Insufficient documentation

## 2016-08-09 DIAGNOSIS — Z791 Long term (current) use of non-steroidal anti-inflammatories (NSAID): Secondary | ICD-10-CM | POA: Diagnosis not present

## 2016-08-09 DIAGNOSIS — E119 Type 2 diabetes mellitus without complications: Secondary | ICD-10-CM | POA: Insufficient documentation

## 2016-08-09 DIAGNOSIS — Z7982 Long term (current) use of aspirin: Secondary | ICD-10-CM | POA: Diagnosis not present

## 2016-08-09 DIAGNOSIS — Y929 Unspecified place or not applicable: Secondary | ICD-10-CM | POA: Insufficient documentation

## 2016-08-09 DIAGNOSIS — Z7984 Long term (current) use of oral hypoglycemic drugs: Secondary | ICD-10-CM | POA: Diagnosis not present

## 2016-08-09 DIAGNOSIS — Z79899 Other long term (current) drug therapy: Secondary | ICD-10-CM | POA: Insufficient documentation

## 2016-08-09 DIAGNOSIS — I10 Essential (primary) hypertension: Secondary | ICD-10-CM | POA: Insufficient documentation

## 2016-08-09 DIAGNOSIS — W51XXXA Accidental striking against or bumped into by another person, initial encounter: Secondary | ICD-10-CM | POA: Diagnosis not present

## 2016-08-09 DIAGNOSIS — Y999 Unspecified external cause status: Secondary | ICD-10-CM | POA: Diagnosis not present

## 2016-08-09 DIAGNOSIS — S43005A Unspecified dislocation of left shoulder joint, initial encounter: Secondary | ICD-10-CM

## 2016-08-09 DIAGNOSIS — S4992XA Unspecified injury of left shoulder and upper arm, initial encounter: Secondary | ICD-10-CM | POA: Diagnosis present

## 2016-08-09 DIAGNOSIS — Y939 Activity, unspecified: Secondary | ICD-10-CM | POA: Insufficient documentation

## 2016-08-09 MED ORDER — MORPHINE SULFATE (PF) 2 MG/ML IV SOLN
2.0000 mg | Freq: Once | INTRAVENOUS | Status: AC
  Administered 2016-08-09: 2 mg via INTRAVENOUS
  Filled 2016-08-09: qty 1

## 2016-08-09 MED ORDER — ONDANSETRON HCL 4 MG/2ML IJ SOLN
4.0000 mg | Freq: Once | INTRAMUSCULAR | Status: AC
  Administered 2016-08-09: 4 mg via INTRAVENOUS
  Filled 2016-08-09: qty 2

## 2016-08-09 MED ORDER — PROPOFOL 10 MG/ML IV BOLUS
0.5000 mg/kg | Freq: Once | INTRAVENOUS | Status: AC
  Administered 2016-08-10: 34.3 mg via INTRAVENOUS
  Filled 2016-08-09: qty 20
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
## 2016-08-09 NOTE — ED Notes (Signed)
MD at bedside. 

## 2016-08-10 ENCOUNTER — Emergency Department (HOSPITAL_COMMUNITY): Payer: Medicare Other

## 2016-08-10 MED ORDER — HYDROCODONE-ACETAMINOPHEN 5-325 MG PO TABS: 0.5000 | ORAL_TABLET | ORAL | 0 refills | Status: DC | PRN

## 2016-08-10 NOTE — Patient Instructions (Signed)
Amanda Green  08/10/2016   Your procedure is scheduled on: 08/15/2016    Report to [facility] Main  Entrance take [name]  elevators to 3rd floor to  [name] at    [place] AM.  Call this number if you have problems the morning of surgery [phone]   Remember: ONLY 1 PERSON MAY GO WITH YOU TO SHORT STAY TO GET  READY MORNING OF [name].  Do not eat food or drink liquids :After Midnight.             Follow BOWEL PREP instructions per MD.               Drink plenty of clear liquids on day of bowel prep to prevent dehydration.       Take these medicines the morning of surgery with A SIP OF WATER: Amlodipine ( NORvasc), Clonidine ( Catapres), Cosopt eye drops, Hydrocodone if needed  DO NOT TAKE ANY DIABETIC MEDICATIONS DAY OF YOUR SURGERY                               You may not have any metal on your body including hair pins and              piercings  Do not wear jewelry, make-up, lotions, powders or perfumes, deodorant             Do not wear nail polish.  Do not shave  48 hours prior to surgery.            Do not bring valuables to the hospital. [place].  Contacts, dentures or bridgework may not be worn into surgery.  Leave suitcase in the car. After surgery it may be brought to your room.       Special Instructions: coughing and deep breathing exercises, leg exercises               Please read over the following fact sheets you were given: _____________________________________________________________________             [facility] - Preparing for Surgery Before surgery, you can play an important role.  Because skin is not sterile, your skin needs to be as free of germs as possible.  You can reduce the number of germs on your skin by washing with CHG (chlorahexidine gluconate) soap before surgery.  CHG is an antiseptic cleaner which kills germs and bonds with the skin to continue killing  germs even after washing. Please DO NOT use if you have an allergy to CHG or antibacterial soaps.  If your skin becomes reddened/irritated stop using the CHG and inform your nurse when you arrive at Short Stay. Do not shave (including legs and underarms) for at least 48 hours prior to the first CHG shower.  You may shave your face/neck. Please follow these instructions carefully:  1.  Shower with CHG Soap the night before surgery and the  morning of Surgery.  2.  If you choose to wash your hair, wash your hair first as usual with your  normal  shampoo.  3.  After you shampoo, rinse your hair and body thoroughly to remove the  shampoo.  4.  Use CHG as you would any other liquid soap.  You can apply chg directly  to the skin and wash                       Gently with a scrungie or clean washcloth.  5.  Apply the CHG Soap to your body ONLY FROM THE NECK DOWN.   Do not use on face/ open                           Wound or open sores. Avoid contact with eyes, ears mouth and genitals (private parts).                       Wash face,  Genitals (private parts) with your normal soap.             6.  Wash thoroughly, paying special attention to the area where your surgery  will be performed.  7.  Thoroughly rinse your body with warm water from the neck down.  8.  DO NOT shower/wash with your normal soap after using and rinsing off  the CHG Soap.                9.  Pat yourself dry with a clean towel.            10.  Wear clean pajamas.            11.  Place clean sheets on your bed the night of your first shower and do not  sleep with pets. Day of Surgery : Do not apply any lotions/deodorants the morning of surgery.  Please wear clean clothes to the hospital/surgery center.  FAILURE TO FOLLOW THESE INSTRUCTIONS MAY RESULT IN THE CANCELLATION OF YOUR SURGERY PATIENT SIGNATURE_________________________________  NURSE  SIGNATURE__________________________________  ________________________________________________________________________    CLEAR LIQUID DIET   Foods Allowed                                                                     Foods Excluded  Coffee and tea, regular and decaf                             liquids that you cannot  Plain Jell-O in any flavor                                             see through such as: Fruit ices (not with fruit pulp)                                     milk, soups, orange juice  Iced Popsicles                                    All solid food Carbonated beverages, regular and diet  Cranberry, grape and apple juices Sports drinks like Gatorade Lightly seasoned clear broth or consume(fat free) Sugar, honey syrup  Sample Menu Breakfast                                Lunch                                     Supper Cranberry juice                    Beef broth                            Chicken broth Jell-O                                     Grape juice                           Apple juice Coffee or tea                        Jell-O                                      Popsicle                                                Coffee or tea                        Coffee or tea  _____________________________________________________________________
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
## 2016-08-10 NOTE — ED Notes (Signed)
L shoulder immobilizer applied and education given.

## 2016-08-13 ENCOUNTER — Other Ambulatory Visit: Payer: Self-pay

## 2016-08-13 ENCOUNTER — Encounter (HOSPITAL_COMMUNITY): Payer: Self-pay

## 2016-08-13 ENCOUNTER — Encounter (HOSPITAL_COMMUNITY)
Admission: RE | Admit: 2016-08-13 | Discharge: 2016-08-13 | Disposition: A | Discharge status: Home / Self Care | Payer: Medicare Other | Source: Ambulatory Visit | Attending: Surgery | Admitting: Surgery

## 2016-08-13 HISTORY — DX: Cerebral infarction, unspecified: I63.9

## 2016-08-13 HISTORY — DX: Unspecified dislocation of left shoulder joint, initial encounter: S43.005A

## 2016-08-13 LAB — BASIC METABOLIC PANEL
Anion gap: 7 (ref 5–15)
BUN: 11 mg/dL (ref 6–20)
CO2: 27 mmol/L (ref 22–32)
Calcium: 10.3 mg/dL (ref 8.9–10.3)
Chloride: 106 mmol/L (ref 101–111)
Creatinine, Ser: 0.65 mg/dL (ref 0.44–1.00)
GFR calc Af Amer: >60 mL/min (ref >60)
GFR calc non Af Amer: >60 mL/min (ref >60)
Glucose, Bld: 98 mg/dL (ref 65–99)
Potassium: 3.8 mmol/L (ref 3.5–5.1)
Sodium: 140 mmol/L (ref 135–145)

## 2016-08-13 LAB — ABO/RH: ABO/RH(D): A POS

## 2016-08-13 LAB — CBC
HCT: 37 % (ref 36.0–46.0)
Hemoglobin: 12.3 g/dL (ref 12.0–15.0)
MCH: 28.5 pg (ref 26.0–34.0)
MCHC: 33.2 g/dL (ref 30.0–36.0)
MCV: 85.8 fL (ref 78.0–100.0)
Platelets: 332 10*3/uL (ref 150–400)
RBC: 4.31 MIL/uL (ref 3.87–5.11)
RDW: 15 % (ref 11.5–15.5)
WBC: 6.6 10*3/uL (ref 4.0–10.5)

## 2016-08-13 NOTE — Progress Notes (Signed)
Patient stated at preop appointment she had not received bowel prep instructions for surgery.  Called office and spoke with Shameera at office.  Patient made aware to go by [facility] today and receive bowel prep instructions.  I called office of [facility] back and Shameera stated patient had received bowel prep instructions.

## 2016-08-14 LAB — HEMOGLOBIN A1C
Hgb A1c MFr Bld: 6 % — ABNORMAL HIGH (ref 4.8–5.6)
Mean Plasma Glucose: 126 mg/dL

## 2016-08-14 NOTE — Anesthesia Preprocedure Evaluation (Addendum)
Anesthesia Evaluation  Patient identified by MRN, date of birth, ID band Patient awake    Reviewed: Allergy & Precautions, NPO status , Patient's Chart, lab work & pertinent test results  History of Anesthesia Complications (+) DIFFICULT AIRWAY  Airway Mallampati: I  TM Distance: >3 FB Neck ROM: Full    Dental  (+) Teeth Intact   Pulmonary neg pulmonary ROS,    breath sounds clear to auscultation       Cardiovascular hypertension, Pt. on medications  Rhythm:Regular Rate:Normal     Neuro/Psych CVA negative psych ROS   GI/Hepatic negative GI ROS, Neg liver ROS,   Endo/Other  diabetes, Type 2, Oral Hypoglycemic Agents  Renal/GU negative Renal ROS  negative genitourinary   Musculoskeletal  (+) Arthritis , Osteoarthritis,    Abdominal   Peds negative pediatric ROS (+)  Hematology negative hematology ROS (+)   Anesthesia Other Findings   Reproductive/Obstetrics negative OB ROS                           Lab Results  Component Value Date   WBC 6.6 08/13/2016   HGB 12.3 08/13/2016   HCT 37.0 08/13/2016   MCV 85.8 08/13/2016   PLT 332 08/13/2016   Lab Results  Component Value Date   CREATININE 0.65 08/13/2016   BUN 11 08/13/2016   NA 140 08/13/2016   K 3.8 08/13/2016   CL 106 08/13/2016   CO2 27 08/13/2016   Lab Results  Component Value Date   INR 1.00 03/23/2010   07/2016 EKG: normal sinus rhythm, occasional PVC noted, unifocal.  Anesthesia Physical Anesthesia Plan  ASA: II  Anesthesia Plan: General   Post-op Pain Management:    Induction: Intravenous  Airway Management Planned: Oral ETT  Additional Equipment:   Intra-op Plan:   Post-operative Plan: Extubation in OR  Informed Consent: I have reviewed the patients History and Physical, chart, labs and discussed the procedure including the risks, benefits and alternatives for the proposed anesthesia with the patient or  authorized representative who has indicated his/her understanding and acceptance.   Dental advisory given  Plan Discussed with: CRNA  Anesthesia Plan Comments:         Anesthesia Quick Evaluation

## 2016-08-15 ENCOUNTER — Encounter (HOSPITAL_COMMUNITY)
Admission: RE | Disposition: A | Discharge status: Home / Self Care | Payer: Self-pay | Source: Ambulatory Visit | Attending: Surgery

## 2016-08-15 ENCOUNTER — Inpatient Hospital Stay (HOSPITAL_COMMUNITY)
Admission: RE | Admit: 2016-08-15 | Discharge: 2016-08-17 | DRG: 331 | Disposition: A | Discharge status: Home / Self Care | Payer: Medicare Other | Source: Ambulatory Visit | Attending: Surgery | Admitting: Surgery

## 2016-08-15 ENCOUNTER — Inpatient Hospital Stay (HOSPITAL_COMMUNITY): Payer: Medicare Other | Admitting: Anesthesiology

## 2016-08-15 ENCOUNTER — Encounter (HOSPITAL_COMMUNITY): Payer: Self-pay | Admitting: *Deleted

## 2016-08-15 DIAGNOSIS — Z7984 Long term (current) use of oral hypoglycemic drugs: Secondary | ICD-10-CM | POA: Diagnosis not present

## 2016-08-15 DIAGNOSIS — E119 Type 2 diabetes mellitus without complications: Secondary | ICD-10-CM | POA: Diagnosis not present

## 2016-08-15 DIAGNOSIS — Z7982 Long term (current) use of aspirin: Secondary | ICD-10-CM

## 2016-08-15 DIAGNOSIS — M1990 Unspecified osteoarthritis, unspecified site: Secondary | ICD-10-CM | POA: Diagnosis present

## 2016-08-15 DIAGNOSIS — D120 Benign neoplasm of cecum: Secondary | ICD-10-CM | POA: Diagnosis not present

## 2016-08-15 DIAGNOSIS — I10 Essential (primary) hypertension: Secondary | ICD-10-CM | POA: Diagnosis present

## 2016-08-15 DIAGNOSIS — Z9010 Acquired absence of unspecified breast and nipple: Secondary | ICD-10-CM

## 2016-08-15 DIAGNOSIS — K429 Umbilical hernia without obstruction or gangrene: Secondary | ICD-10-CM | POA: Diagnosis not present

## 2016-08-15 DIAGNOSIS — K635 Polyp of colon: Secondary | ICD-10-CM | POA: Diagnosis present

## 2016-08-15 DIAGNOSIS — Z79899 Other long term (current) drug therapy: Secondary | ICD-10-CM | POA: Diagnosis not present

## 2016-08-15 DIAGNOSIS — Z853 Personal history of malignant neoplasm of breast: Secondary | ICD-10-CM | POA: Diagnosis not present

## 2016-08-15 LAB — GLUCOSE, CAPILLARY
Glucose-Capillary: 93 mg/dL (ref 65–99)
Glucose-Capillary: 216 mg/dL — ABNORMAL HIGH (ref 65–99)
Glucose-Capillary: 199 mg/dL — ABNORMAL HIGH (ref 65–99)
Glucose-Capillary: 164 mg/dL — ABNORMAL HIGH (ref 65–99)
Glucose-Capillary: 301 mg/dL — ABNORMAL HIGH (ref 65–99)

## 2016-08-15 LAB — TYPE AND SCREEN
ABO/RH(D): A POS
Antibody Screen: NEGATIVE

## 2016-08-15 SURGERY — COLECTOMY, PARTIAL, ROBOT-ASSISTED, LAPAROSCOPIC
Anesthesia: General

## 2016-08-15 MED ORDER — HEPARIN SODIUM (PORCINE) 5000 UNIT/ML IJ SOLN
5000.0000 [IU] | Freq: Three times a day (TID) | INTRAMUSCULAR | Status: DC
  Administered 2016-08-16 – 2016-08-17 (×4): 5000 [IU] via SUBCUTANEOUS
  Filled 2016-08-15 (×4): qty 1

## 2016-08-15 MED ORDER — LACTATED RINGERS IV SOLN: INTRAVENOUS | Status: DC

## 2016-08-15 MED ORDER — ONDANSETRON 4 MG PO TBDP: 4.0000 mg | ORAL_TABLET | Freq: Four times a day (QID) | ORAL | Status: DC | PRN

## 2016-08-15 MED ORDER — SUGAMMADEX SODIUM 200 MG/2ML IV SOLN
INTRAVENOUS | Status: AC
  Filled 2016-08-15: qty 2

## 2016-08-15 MED ORDER — ONDANSETRON HCL 4 MG/2ML IJ SOLN
INTRAMUSCULAR | Status: DC | PRN
  Administered 2016-08-15: 4 mg via INTRAVENOUS

## 2016-08-15 MED ORDER — ROCURONIUM BROMIDE 100 MG/10ML IV SOLN
INTRAVENOUS | Status: AC
  Filled 2016-08-15: qty 1

## 2016-08-15 MED ORDER — ONDANSETRON HCL 4 MG/2ML IJ SOLN
INTRAMUSCULAR | Status: AC
  Filled 2016-08-15: qty 2

## 2016-08-15 MED ORDER — CELECOXIB 200 MG PO CAPS
400.0000 mg | ORAL_CAPSULE | ORAL | Status: AC
  Administered 2016-08-15: 400 mg via ORAL
  Filled 2016-08-15 (×2): qty 2

## 2016-08-15 MED ORDER — SUGAMMADEX SODIUM 200 MG/2ML IV SOLN
INTRAVENOUS | Status: DC | PRN
  Administered 2016-08-15: 150 mg via INTRAVENOUS

## 2016-08-15 MED ORDER — FENTANYL CITRATE (PF) 250 MCG/5ML IJ SOLN
INTRAMUSCULAR | Status: AC
  Filled 2016-08-15: qty 5

## 2016-08-15 MED ORDER — DEXTROSE 5 % IV SOLN
2.0000 g | Freq: Two times a day (BID) | INTRAVENOUS | Status: AC
  Administered 2016-08-15: 2 g via INTRAVENOUS
  Filled 2016-08-15: qty 2

## 2016-08-15 MED ORDER — KCL IN DEXTROSE-NACL 20-5-0.45 MEQ/L-%-% IV SOLN
INTRAVENOUS | Status: DC
  Administered 2016-08-15 – 2016-08-16 (×3): via INTRAVENOUS
  Filled 2016-08-15 (×4): qty 1000

## 2016-08-15 MED ORDER — HYDROMORPHONE HCL 1 MG/ML IJ SOLN
INTRAMUSCULAR | Status: DC | PRN
  Administered 2016-08-15: 1 mg via INTRAVENOUS
  Administered 2016-08-15 (×2): 0.5 mg via INTRAVENOUS

## 2016-08-15 MED ORDER — DEXAMETHASONE SODIUM PHOSPHATE 10 MG/ML IJ SOLN
INTRAMUSCULAR | Status: AC
  Filled 2016-08-15: qty 1

## 2016-08-15 MED ORDER — HEPARIN SODIUM (PORCINE) 5000 UNIT/ML IJ SOLN
5000.0000 [IU] | Freq: Once | INTRAMUSCULAR | Status: AC
  Administered 2016-08-15: 5000 [IU] via SUBCUTANEOUS
  Filled 2016-08-15: qty 1

## 2016-08-15 MED ORDER — FAMOTIDINE IN NACL 20-0.9 MG/50ML-% IV SOLN
20.0000 mg | Freq: Two times a day (BID) | INTRAVENOUS | Status: DC
  Administered 2016-08-15 – 2016-08-17 (×4): 20 mg via INTRAVENOUS
  Filled 2016-08-15 (×4): qty 50

## 2016-08-15 MED ORDER — SODIUM CHLORIDE 0.9 % IJ SOLN
INTRAMUSCULAR | Status: DC | PRN
  Administered 2016-08-15: 10 mL

## 2016-08-15 MED ORDER — PHENYLEPHRINE HCL 10 MG/ML IJ SOLN
INTRAMUSCULAR | Status: DC | PRN
  Administered 2016-08-15: 10 ug/min via INTRAVENOUS

## 2016-08-15 MED ORDER — DEXTROSE 5 % IV SOLN
2.0000 g | INTRAVENOUS | Status: AC
  Administered 2016-08-15: 2 g via INTRAVENOUS
  Filled 2016-08-15: qty 2

## 2016-08-15 MED ORDER — INSULIN ASPART 100 UNIT/ML ~~LOC~~ SOLN
0.0000 [IU] | SUBCUTANEOUS | Status: DC
  Administered 2016-08-15: 3 [IU] via SUBCUTANEOUS
  Administered 2016-08-15: 5 [IU] via SUBCUTANEOUS
  Administered 2016-08-15: 11 [IU] via SUBCUTANEOUS
  Administered 2016-08-16: 5 [IU] via SUBCUTANEOUS
  Administered 2016-08-16: 3 [IU] via SUBCUTANEOUS
  Administered 2016-08-16: 8 [IU] via SUBCUTANEOUS
  Administered 2016-08-16: 2 [IU] via SUBCUTANEOUS
  Administered 2016-08-16 (×2): 3 [IU] via SUBCUTANEOUS
  Administered 2016-08-17 (×2): 2 [IU] via SUBCUTANEOUS
  Administered 2016-08-17: 3 [IU] via SUBCUTANEOUS

## 2016-08-15 MED ORDER — MORPHINE SULFATE (PF) 2 MG/ML IV SOLN
1.0000 mg | INTRAVENOUS | Status: DC | PRN
  Administered 2016-08-15 – 2016-08-16 (×3): 1 mg via INTRAVENOUS
  Filled 2016-08-15 (×4): qty 1

## 2016-08-15 MED ORDER — ALVIMOPAN 12 MG PO CAPS
12.0000 mg | ORAL_CAPSULE | Freq: Once | ORAL | Status: AC
  Administered 2016-08-15: 12 mg via ORAL
  Filled 2016-08-15: qty 1

## 2016-08-15 MED ORDER — SODIUM CHLORIDE 0.9 % IJ SOLN
INTRAMUSCULAR | Status: AC
  Filled 2016-08-15: qty 10

## 2016-08-15 MED ORDER — ACETAMINOPHEN 500 MG PO TABS
1000.0000 mg | ORAL_TABLET | ORAL | Status: AC
  Administered 2016-08-15: 1000 mg via ORAL
  Filled 2016-08-15 (×2): qty 2

## 2016-08-15 MED ORDER — LACTATED RINGERS IR SOLN
Status: DC | PRN
  Administered 2016-08-15: 1000 mL

## 2016-08-15 MED ORDER — 0.9 % SODIUM CHLORIDE (POUR BTL) OPTIME
TOPICAL | Status: DC | PRN
  Administered 2016-08-15: 2000 mL

## 2016-08-15 MED ORDER — LIDOCAINE HCL (CARDIAC) 20 MG/ML IV SOLN
INTRAVENOUS | Status: DC | PRN
  Administered 2016-08-15: 50 mg via INTRAVENOUS

## 2016-08-15 MED ORDER — PROPOFOL 10 MG/ML IV BOLUS
INTRAVENOUS | Status: AC
  Filled 2016-08-15: qty 20

## 2016-08-15 MED ORDER — BUPIVACAINE LIPOSOME 1.3 % IJ SUSP
20.0000 mL | Freq: Once | INTRAMUSCULAR | Status: AC
  Administered 2016-08-15: 20 mL
  Filled 2016-08-15: qty 20

## 2016-08-15 MED ORDER — FENTANYL CITRATE (PF) 100 MCG/2ML IJ SOLN
INTRAMUSCULAR | Status: DC | PRN
  Administered 2016-08-15 (×4): 50 ug via INTRAVENOUS

## 2016-08-15 MED ORDER — ONDANSETRON HCL 4 MG/2ML IJ SOLN: 4.0000 mg | Freq: Four times a day (QID) | INTRAMUSCULAR | Status: DC | PRN

## 2016-08-15 MED ORDER — DEXAMETHASONE SODIUM PHOSPHATE 10 MG/ML IJ SOLN
INTRAMUSCULAR | Status: DC | PRN
  Administered 2016-08-15: 10 mg via INTRAVENOUS

## 2016-08-15 MED ORDER — LIDOCAINE 2% (20 MG/ML) 5 ML SYRINGE
INTRAMUSCULAR | Status: AC
  Filled 2016-08-15: qty 5

## 2016-08-15 MED ORDER — CEFOTETAN DISODIUM-DEXTROSE 2-2.08 GM-% IV SOLR
INTRAVENOUS | Status: AC
  Filled 2016-08-15: qty 50

## 2016-08-15 MED ORDER — PROPOFOL 10 MG/ML IV BOLUS
INTRAVENOUS | Status: DC | PRN
  Administered 2016-08-15: 140 mg via INTRAVENOUS

## 2016-08-15 MED ORDER — LACTATED RINGERS IV SOLN
INTRAVENOUS | Status: DC | PRN
Start: 2016-08-15 — End: 2016-08-15
  Administered 2016-08-15 (×3): via INTRAVENOUS

## 2016-08-15 MED ORDER — PHENYLEPHRINE HCL 10 MG/ML IJ SOLN
INTRAMUSCULAR | Status: AC
  Filled 2016-08-15: qty 1

## 2016-08-15 MED ORDER — GABAPENTIN 300 MG PO CAPS
300.0000 mg | ORAL_CAPSULE | ORAL | Status: AC
  Administered 2016-08-15: 300 mg via ORAL
  Filled 2016-08-15: qty 1

## 2016-08-15 MED ORDER — INSULIN ASPART 100 UNIT/ML ~~LOC~~ SOLN
SUBCUTANEOUS | Status: AC
  Filled 2016-08-15: qty 1

## 2016-08-15 MED ORDER — EPHEDRINE SULFATE 50 MG/ML IJ SOLN
INTRAMUSCULAR | Status: DC | PRN
  Administered 2016-08-15: 5 mg via INTRAVENOUS

## 2016-08-15 MED ORDER — MEPERIDINE HCL 50 MG/ML IJ SOLN: 6.2500 mg | INTRAMUSCULAR | Status: DC | PRN

## 2016-08-15 MED ORDER — EPHEDRINE 5 MG/ML INJ
INTRAVENOUS | Status: AC
  Filled 2016-08-15: qty 10

## 2016-08-15 MED ORDER — HYDROMORPHONE HCL 2 MG/ML IJ SOLN
INTRAMUSCULAR | Status: AC
  Filled 2016-08-15: qty 1

## 2016-08-15 MED ORDER — PROMETHAZINE HCL 25 MG/ML IJ SOLN: 6.2500 mg | INTRAMUSCULAR | Status: DC | PRN

## 2016-08-15 MED ORDER — ROCURONIUM BROMIDE 100 MG/10ML IV SOLN
INTRAVENOUS | Status: DC | PRN
  Administered 2016-08-15 (×2): 10 mg via INTRAVENOUS
  Administered 2016-08-15: 50 mg via INTRAVENOUS
  Administered 2016-08-15 (×2): 10 mg via INTRAVENOUS

## 2016-08-15 MED ORDER — HYDROMORPHONE HCL 1 MG/ML IJ SOLN: 0.2500 mg | INTRAMUSCULAR | Status: DC | PRN

## 2016-08-15 SURGICAL SUPPLY — 86 items
BLADE EXTENDED COATED 6.5IN (ELECTRODE) IMPLANT
BLADE SURG SZ11 CARB STEEL (BLADE) ×2 IMPLANT
CANNULA REDUC XI 12-8 STAPL (CANNULA) ×1
CANNULA REDUCER 12-8 DVNC XI (CANNULA) IMPLANT
CELLS DAT CNTRL 66122 CELL SVR (MISCELLANEOUS) ×1 IMPLANT
CHLORAPREP W/TINT 26ML (MISCELLANEOUS) ×1 IMPLANT
CLIP LIGATING HEM O LOK PURPLE (MISCELLANEOUS) IMPLANT
CLIP LIGATING HEMOLOK MED (MISCELLANEOUS) IMPLANT
COUNTER NEEDLE 20 DBL MAG RED (NEEDLE) ×2 IMPLANT
COVER SURGICAL LIGHT HANDLE (MISCELLANEOUS) ×1 IMPLANT
COVER TIP SHEARS 8 DVNC (MISCELLANEOUS) ×1 IMPLANT
COVER TIP SHEARS 8MM DA VINCI (MISCELLANEOUS) ×1
DECANTER SPIKE VIAL GLASS SM (MISCELLANEOUS) ×2 IMPLANT
DEVICE TROCAR PUNCTURE CLOSURE (ENDOMECHANICALS) IMPLANT
DRAIN CHANNEL 19F RND (DRAIN) IMPLANT
DRAPE ARM DVNC X/XI (DISPOSABLE) ×4 IMPLANT
DRAPE COLUMN DVNC XI (DISPOSABLE) ×1 IMPLANT
DRAPE DA VINCI XI ARM (DISPOSABLE) ×4
DRAPE DA VINCI XI COLUMN (DISPOSABLE) ×1
DRAPE SURG IRRIG POUCH 19X23 (DRAPES) ×1 IMPLANT
DRSG OPSITE POSTOP 4X10 (GAUZE/BANDAGES/DRESSINGS) IMPLANT
DRSG OPSITE POSTOP 4X6 (GAUZE/BANDAGES/DRESSINGS) ×1 IMPLANT
DRSG OPSITE POSTOP 4X8 (GAUZE/BANDAGES/DRESSINGS) IMPLANT
ELECT CAUTERY BLADE 6.4 (BLADE) ×1 IMPLANT
ELECT PENCIL ROCKER SW 15FT (MISCELLANEOUS) ×4 IMPLANT
ELECT REM PT RETURN 15FT ADLT (MISCELLANEOUS) ×2 IMPLANT
ENDOLOOP SUT PDS II  0 18 (SUTURE)
ENDOLOOP SUT PDS II 0 18 (SUTURE) IMPLANT
EVACUATOR SILICONE 100CC (DRAIN) IMPLANT
GAUZE SPONGE 4X4 12PLY STRL (GAUZE/BANDAGES/DRESSINGS) IMPLANT
GLOVE BIO SURGEON STRL SZ7.5 (GLOVE) ×3 IMPLANT
GLOVE ECLIPSE 7.0 STRL STRAW (GLOVE) ×6 IMPLANT
GLOVE INDICATOR 8.0 STRL GRN (GLOVE) ×6 IMPLANT
GLOVE SURG SS PI 7.0 STRL IVOR (GLOVE) ×2 IMPLANT
GOWN STRL REUS W/TWL XL LVL3 (GOWN DISPOSABLE) ×9 IMPLANT
HANDLE STAPLE EGIA 4 XL (STAPLE) ×1 IMPLANT
HOLDER FOLEY CATH W/STRAP (MISCELLANEOUS) ×2 IMPLANT
LEGGING LITHOTOMY PAIR STRL (DRAPES) ×1 IMPLANT
LIQUID BAND (GAUZE/BANDAGES/DRESSINGS) ×2 IMPLANT
NDL INSUFFLATION 14GA 120MM (NEEDLE) ×1 IMPLANT
NEEDLE INSUFFLATION 14GA 120MM (NEEDLE) IMPLANT
PACK CARDIOVASCULAR III (CUSTOM PROCEDURE TRAY) ×2 IMPLANT
PACK COLON (CUSTOM PROCEDURE TRAY) ×2 IMPLANT
PACK GENERAL/GYN (CUSTOM PROCEDURE TRAY) ×2 IMPLANT
PORT LAP GEL ALEXIS MED 5-9CM (MISCELLANEOUS) IMPLANT
RELOAD EGIA 60 MED/THCK PURPLE (STAPLE) ×2 IMPLANT
RELOAD STAPLE 45 BLU REG DVNC (STAPLE) IMPLANT
RELOAD STAPLE 45 GRN THCK DVNC (STAPLE) IMPLANT
RELOAD STAPLE 60 MED/THCK ART (STAPLE) IMPLANT
RETRACTOR WND ALEXIS 18 MED (MISCELLANEOUS) IMPLANT
RTRCTR WOUND ALEXIS 18CM MED (MISCELLANEOUS) ×2
SCISSORS LAP 5X35 DISP (ENDOMECHANICALS) ×2 IMPLANT
SEAL CANN UNIV 5-8 DVNC XI (MISCELLANEOUS) ×3 IMPLANT
SEAL XI 5MM-8MM UNIVERSAL (MISCELLANEOUS) ×3
SEALER VESSEL DA VINCI XI (MISCELLANEOUS) ×1
SEALER VESSEL EXT DVNC XI (MISCELLANEOUS) ×1 IMPLANT
SET BI-LUMEN FLTR TB AIRSEAL (TUBING) ×2 IMPLANT
SLEEVE XCEL OPT CAN 5 100 (ENDOMECHANICALS) IMPLANT
SOLUTION ELECTROLUBE (MISCELLANEOUS) ×2 IMPLANT
STAPLER 45 BLU RELOAD XI (STAPLE) ×3 IMPLANT
STAPLER 45 BLUE RELOAD XI (STAPLE) ×3
STAPLER 45 GREEN RELOAD XI (STAPLE)
STAPLER 45 GRN RELOAD XI (STAPLE) IMPLANT
STAPLER CANNULA SEAL DVNC XI (STAPLE) IMPLANT
STAPLER CANNULA SEAL XI (STAPLE) ×1
STAPLER SHEATH (SHEATH) ×1
STAPLER SHEATH ENDOWRIST DVNC (SHEATH) IMPLANT
STAPLER VISISTAT 35W (STAPLE) ×2 IMPLANT
SUT ETHILON 2 0 PS N (SUTURE) IMPLANT
SUT MNCRL AB 4-0 PS2 18 (SUTURE) ×2 IMPLANT
SUT NOVA NAB GS-21 1 T12 (SUTURE) ×4 IMPLANT
SUT PDS AB 1 CTX 36 (SUTURE) IMPLANT
SUT PDS AB 2-0 CT2 27 (SUTURE) ×2 IMPLANT
SUT PROLENE 2 0 SH DA (SUTURE) ×1 IMPLANT
SUT SILK 2 0 (SUTURE) ×2
SUT SILK 2 0 SH CR/8 (SUTURE) ×2 IMPLANT
SUT SILK 2-0 18XBRD TIE 12 (SUTURE) ×1 IMPLANT
SUT SILK 3 0 (SUTURE) ×2
SUT SILK 3 0 SH CR/8 (SUTURE) ×2 IMPLANT
SUT SILK 3-0 18XBRD TIE 12 (SUTURE) ×1 IMPLANT
SUT VICRYL 0 TIES 12 18 (SUTURE) IMPLANT
SYRINGE 10CC LL (SYRINGE) ×2 IMPLANT
TOWEL OR NON WOVEN STRL DISP B (DISPOSABLE) ×2 IMPLANT
TRAY FOLEY W/METER SILVER 14FR (SET/KITS/TRAYS/PACK) ×2 IMPLANT
TRAY FOLEY W/METER SILVER 16FR (SET/KITS/TRAYS/PACK) ×1 IMPLANT
TROCAR BLADELESS OPT 5 100 (ENDOMECHANICALS) ×2 IMPLANT

## 2016-08-15 NOTE — Progress Notes (Signed)
Pt tolerated clear liquid tray for dinner with no nausea after ambulation.  Advancing to full liquid per MD order for 'advance diet as tolerated'.  Will continue to monitor.  Amanda Green

## 2016-08-15 NOTE — Progress Notes (Signed)
Dr. Hassell Done made aware of patient's CBG RESULTS 216- in PACU- Orders to be written

## 2016-08-15 NOTE — H&P (Signed)
Amanda Green 07/05/2016 10:21 AM Location: [facility] Patient #: [id number] DOB: 05-Sep-1935 Married / Language: English / Race: Black or African American Female   History of Present Illness Amanda Key B. Hassell Done MD; 07/05/2016 10:43 AM) The patient is a 81 year old female who presents with a colonic polyp. The patient was referred by the patient's primary provider (Dr. Donnie Green) and referred by a gastroenterologist (Dr. Penelope Green). 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 This not amenable to colonoscopic resection. I discussed a minimally invasive approach either laparoscopically or with a robotic assistance. Would do this on the [facility] at [facility]. She wants to go ahead and have this resected. I explained the procedures in detail including risk of anastomosis healing. One extenuating circumstances her husband's health who has lung cancer and is undergoing treatment at the present time.  She has had a prior hysterectomy many years ago. I continue follow her as a postop patient of her breast cancer mastectomy in 1999.   Allergies Amanda Green, CMA; 07/05/2016 10:21 AM) No Known Drug Allergies12/24/2015  Medication History Amanda Green, CMA; 07/05/2016 10:24 AM) Aspirin (81MG  Tablet, Oral) Active. Illusions AA Breast Prosthesis (1 (one) Misc as needed, Taken starting 11/30/2014) Active. (C50.919 Right Mastectomy ; ; L8000- Post-Surgical Bras-6; ; Q000111Q- Silicone Breast Prosthesis(Due to Weight Loss)) AmLODIPine Besylate (10MG  Tablet, Oral) Active. CloNIDine HCl (0.1MG  Tablet, Oral) Active. Dorzolamide HCl-Timolol Mal (22.3-6.8MG /ML Solution, Ophthalmic) Active. Gabapentin (400MG  Capsule, Oral) Active. Glimepiride (2MG  Tablet, Oral) Active. Hydrochlorothiazide (25MG  Tablet, Oral) Active. Ibuprofen (800MG  Tablet, Oral) Active. Latanoprost  (0.005% Solution, Ophthalmic) Active. Meloxicam (15MG  Tablet, Oral) Active. MetFORMIN HCl (500MG  Tablet, Oral) Active. OneTouch Verio (In Vitro) Active. Simvastatin (40MG  Tablet, Oral) Active. Vaqta (50UNIT/ML Suspension, Intramuscular) Active. Caltrate 600 + D (600-125MG -IU Tablet, Oral) Active. Medications Reconciled  Vitals (Amanda Green CMA; 07/05/2016 10:21 AM) 07/05/2016 10:21 AM Weight: 151.4 lb Height: 61in Body Surface Area: 1.68 m Body Mass Index: 28.61 kg/m  Temp.: 33F(Oral)  Pulse: 98 (Regular)  BP: 152/96 (Sitting, Left Arm, Standard)       Physical Exam (Amanda Pichon B. Hassell Done MD; 07/05/2016 10:45 AM) General Note: HEENT multiple small skin tags Neck supple Chest clear Prior right mastectomy Heart SR without murmurs Abdomen is soft and nontender Ext FROM     Assessment & Plan Amanda Key B. Hassell Done MD; 07/05/2016 10:47 AM) POLYP OF ASCENDING COLON, UNSPECIFIED TYPE (D12.2) Impression: Cecal carpeting adenomatous polyp at the appendiceal orifice. Will schedule robot assisted right hemicolectomy at [facility]. Procedure explained to patient in some detail.

## 2016-08-15 NOTE — Progress Notes (Signed)
MD Called and received orders for IV fluids Dextrose 5% .45% normal saline with 20 mEq of potassium to run at 100 ml and to discontinue foley catheter.

## 2016-08-15 NOTE — Transfer of Care (Signed)
Immediate Anesthesia Transfer of Care Note  Patient: Amanda Green  Procedure(s) Performed: Procedure(s): XI ROBOT ASSISTED LAPAROSCOPIC PARTIAL COLECTOMY, umbilical hernia repair (N/A)  Patient Location: PACU  Anesthesia Type:General  Level of Consciousness: awake, alert  and oriented  Airway & Oxygen Therapy: Patient Spontanous Breathing and Patient connected to face mask oxygen  Post-op Assessment: Report given to RN and Post -op Vital signs reviewed and stable  Post vital signs: Reviewed and stable  Last Vitals:  Vitals:   08/15/16 0653  BP: (!) 148/78  Pulse: 79  Resp: 16  Temp: 37.2 C    Last Pain:  Vitals:   08/15/16 0705  TempSrc:   PainSc: 0-No pain      Patients Stated Pain Goal: 4 ([id number])  Complications: No apparent anesthesia complications

## 2016-08-15 NOTE — Anesthesia Procedure Notes (Signed)
Procedure Name: Intubation Date/Time: 08/15/2016 8:27 AM Performed by: Noralyn Pick D Pre-anesthesia Checklist: Patient identified, Emergency Drugs available, Suction available and Patient being monitored Patient Re-evaluated:Patient Re-evaluated prior to inductionOxygen Delivery Method: Circle system utilized Preoxygenation: Pre-oxygenation with 100% oxygen Intubation Type: IV induction Ventilation: Mask ventilation without difficulty Laryngoscope Size: Mac, 4 and Glidescope Grade View: Grade III Tube type: Oral Tube size: 7.5 mm Number of attempts: 1 Airway Equipment and Method: Stylet Placement Confirmation: ETT inserted through vocal cords under direct vision,  positive ETCO2 and breath sounds checked- equal and bilateral Secured at: 21 cm Tube secured with: Tape Dental Injury: Teeth and Oropharynx as per pre-operative assessment

## 2016-08-15 NOTE — Anesthesia Postprocedure Evaluation (Signed)
Anesthesia Post Note  Patient: Forest Gleason  Procedure(s) Performed: Procedure(s) (LRB): XI ROBOT ASSISTED LAPAROSCOPIC PARTIAL COLECTOMY, umbilical hernia repair (N/A)  Patient location during evaluation: PACU Anesthesia Type: General Level of consciousness: awake and alert Pain management: pain level controlled Vital Signs Assessment: post-procedure vital signs reviewed and stable Respiratory status: spontaneous breathing, nonlabored ventilation, respiratory function stable and patient connected to nasal cannula oxygen Cardiovascular status: blood pressure returned to baseline and stable Postop Assessment: no signs of nausea or vomiting Anesthetic complications: no    Last Vitals:  Vitals:   08/15/16 1445 08/15/16 1615  BP: (!) 153/60 (!) 149/72  Pulse: 88 88  Resp: 14 14  Temp: 36.5 C 36.6 C    Last Pain:  Vitals:   08/15/16 1615  TempSrc: Oral  PainSc:                  Effie Berkshire

## 2016-08-15 NOTE — Op Note (Signed)
Surgeon: Kaylyn Lim, MD, FACS  Asst:  Gurney Maxin, MD  Anes:  general  Procedure: Xi robotic assisted right colectomy with umbilical hernia repair  Diagnosis: Unresectable polyps of the cecum  Complications: none  EBL:   10 cc  Drains: none  Description of Procedure:  The patient was taken to OR 1 at [facility].  After anesthesia was administered and the patient was prepped a timeout was performed.  The operation began with access achieved through a left upper abdominal port 5 mm Optiview.  The abdomen was insufflated and numerous adhesions were seen in the lower midline.  The robotic ports (8 mm) were inserted and through one the adhesions were taken down.  Four robotic ports placed and docking and burping performed.  Dr. Kieth Brightly was at the table assistant and I worked from the console.    A lateral to medial mobilization of the right colon was performed using the monopolar scissors.  The target lesion was in the cecum and was carpeting polyps.  A prior appendectomy had been done.  The terminal ileum was divided with the robotic stapler blue load.  The proximal transverse colon was identified and divided with two applications of the davinci robotic stapler blue load.  The mesentery was transected with the vessel sealer and the specimen placed above the liver.  The robot was undocked.  Specimen extraction was performed by using one of the robotic trocar sites and carrying the incision up around the umbilicus where the small umbilical hernia was noted.  The ends of the bowel were exteriorized.  The abdomen was irrigated.  The stapled ends were alligned with silks and a functional end to end was created with a Covidien purple load 6 cm stapler.  The common defect was closed with 4-0 PDS and 3-0 silks.  Gown and gloves were changed.  The umbilical hernia was closed along with the midline incision with #1 Novafil.  4-0 vicryl was used to close the wounds and staples were used in the midline.  The  fascia was injected with Exparel.     The patient tolerated the procedure well and was taken to the PACU in stable condition.     Matt B. Hassell Done, [place], [facility], [place]

## 2016-08-16 LAB — GLUCOSE, CAPILLARY
Glucose-Capillary: 230 mg/dL — ABNORMAL HIGH (ref 65–99)
Glucose-Capillary: 194 mg/dL — ABNORMAL HIGH (ref 65–99)
Glucose-Capillary: 140 mg/dL — ABNORMAL HIGH (ref 65–99)
Glucose-Capillary: 187 mg/dL — ABNORMAL HIGH (ref 65–99)
Glucose-Capillary: 271 mg/dL — ABNORMAL HIGH (ref 65–99)
Glucose-Capillary: 185 mg/dL — ABNORMAL HIGH (ref 65–99)

## 2016-08-16 MED ORDER — AMLODIPINE BESYLATE 10 MG PO TABS
10.0000 mg | ORAL_TABLET | Freq: Every day | ORAL | Status: DC
  Administered 2016-08-16 – 2016-08-17 (×2): 10 mg via ORAL
  Filled 2016-08-16 (×2): qty 1

## 2016-08-16 NOTE — Progress Notes (Signed)
Patient had concerns about blood pressure medication. Hassell Done, MD paged and orders received to resume Amlodipine 10 mg by mouth daily. Patient tolerating current diet of full liquids. Will progress to carb modified and continue to monitor.

## 2016-08-17 LAB — GLUCOSE, CAPILLARY
Glucose-Capillary: 133 mg/dL — ABNORMAL HIGH (ref 65–99)
Glucose-Capillary: 143 mg/dL — ABNORMAL HIGH (ref 65–99)
Glucose-Capillary: 151 mg/dL — ABNORMAL HIGH (ref 65–99)

## 2016-08-17 MED ORDER — HYDROCODONE-ACETAMINOPHEN 5-325 MG PO TABS: 1.0000 | ORAL_TABLET | ORAL | 0 refills | Status: DC | PRN

## 2016-08-17 NOTE — Discharge Instructions (Signed)
Laparoscopic Colectomy  Laparoscopic colectomy is surgery to remove part or all of the large intestine (colon). This procedure is used to treat several conditions, including:  · Inflammation and infection of the colon (diverticulitis).  · Tumors or masses in the colon.  · Inflammatory bowel disease, such as Crohn disease or ulcerative colitis. Colectomy is an option when symptoms cannot be controlled with medicines.  · Bleeding from the colon that cannot be controlled by another method.  · Blockage or obstruction of the colon.  LET YOUR HEALTH CARE PROVIDER KNOW ABOUT:  · Any allergies you have.  · All medicines you are taking, including vitamins, herbs, eye drops, creams, and over-the-counter medicines.  · Previous problems you or members of your family have had with the use of anesthetics.  · Any blood disorders you have.  · Previous surgeries you have had.  · Medical conditions you have.  RISKS AND COMPLICATIONS  Generally, this is a safe procedure. However, as with any procedure, complications can occur. Possible complications include:  · Infection.  · Bleeding.  · Damage to other organs.  · Leaking from where the colon was sewn together.  · Future blockage of the small intestines from scar tissue. Another surgery may be needed to repair this.  In some cases, complications such as damage to other organs or excessive bleeding may require the surgeon to convert from a laparoscopic procedure to an open procedure. This involves making a larger incision in the abdomen to perform the procedure.  BEFORE THE PROCEDURE  · Ask your health care provider about changing or stopping any regular medicines.  · You may be prescribed an oral bowel prep. This involves drinking a large amount of medicated liquid, starting the day before your surgery. The liquid will cause you to have multiple loose stools until your stool is almost clear or light green. This cleans out your colon in preparation for the surgery.  · Do not eat or  drink anything else once you have started the bowel prep, unless your health care provider tells you it is safe to do so.  · You may also be given antibiotic pills to clean out your colon of bacteria. Be sure to follow the directions carefully and take the medicine at the correct time.  PROCEDURE   · Small monitors will be put on your body. They are used to check your heart, blood pressure, and oxygen level.  · An IV access tube will be put into one of your veins. Medicine will be able to flow directly into your body through this IV tube.  · You might be given a medicine to help you relax (sedative).  · You will be given a medicine to make you sleep through the procedure (general anesthetic). A breathing tube may be placed into your lungs during the procedure.  · A thin, flexible tube (catheter) will be placed into your bladder to collect urine.  · A tube may be put in through your nose. It is called a nasogastric tube. It is used to remove stomach fluids after surgery until the intestines start working again.  · Your abdomen will be filled with air so that it expands. This gives the surgeon more room to operate and makes your organs easier to see.  · Several small cuts (incisions) are made in your abdomen.  · A thin, lighted tube with a tiny camera on the end (laparoscope) is put through one of the small incisions. The camera on the laparoscope   sends a picture to a TV screen in the operating room. This gives the surgeon a good view inside your abdomen.  · Hollow tubes are put through the other small incisions in your abdomen. The tools needed for the procedure are put through these tubes.  · Clamps or staples are put on both ends of the diseased part of the colon.  · The part of the intestine between the clamps or staples is removed.  · If possible, the ends of the healthy colon that remain will be stitched or stapled together to allow your body to expel waste (stool).  · Sometimes, the remaining colon cannot be  stitched back together. If this is the case, a colostomy is needed. For a colostomy:  ¨ An opening (stoma) to the outside of your body is made through the abdomen.  ¨ The end of the colon is brought to the opening. It is stitched to the skin.  ¨ A bag is attached to the opening. Stool will drain into this bag. The bag is removable.  ¨ The colostomy can be temporary or permanent.  · The incisions from the colectomy are closed with stitches or staples.  AFTER THE PROCEDURE  · You will be monitored closely in a recovery area until you are stable and doing well. You will then be moved to a regular hospital room.  · You will need to receive fluids through an IV tube until your bowel function has returned. This may take 1-3 days. Once your bowels are working again, you will be started on clear liquids and then advanced to solid food as tolerated.  · You will be given pain medicines to control your pain.     This information is not intended to replace advice given to you by your health care provider. Make sure you discuss any questions you have with your health care provider.     Document Released: 02/23/2003 Document Revised: 09/23/2013 Document Reviewed: 07/15/2013  Elsevier Interactive Patient Education ©2016 [employer]

## 2016-08-17 NOTE — Discharge Summary (Signed)
  Physician Discharge Summary  Patient ID: Amanda Green MRN: [id number] DOB/AGE: 79-Dec-1936 79 y.o.  Admit date: 08/15/2016 Discharge date: 08/17/2016  Admission Diagnoses:  Cecal polyps  Discharge Diagnoses:  Path pending  Active Problems:   Colon polyps   Surgery:  Robotic assisted right colectomy  Discharged Condition: improved  Hospital Course:   Had surgery.  Flatus on PD 1.  Diet advanced.  Incision OK.  Ready for discharge  Consults: none  Significant Diagnostic Studies: path pending    Discharge Exam: Blood pressure (!) 149/85, pulse 94, temperature 98.8 F (37.1 C), temperature source Oral, resp. rate 16, height 5\' 1"  (1.549 m), weight 68.9 kg (152 lb), SpO2 95 %. Incisions covered.    Disposition: 01-Home or Self Care  Discharge Instructions    Call MD for:  severe uncontrolled pain    Complete by:  As directed   Diet - low sodium heart healthy    Complete by:  As directed   Discharge wound care:    Complete by:  As directed   Have staples removed from midline incision about 10 days from day of surgery May shower   Increase activity slowly    Complete by:  As directed       Medication List    TAKE these medications   amLODipine 10 MG tablet Commonly known as:  NORVASC Take 10 mg by mouth daily.   aspirin EC 81 MG tablet Take 81 mg by mouth daily.   atorvastatin 40 MG tablet Commonly known as:  LIPITOR Take 40 mg by mouth daily.   bimatoprost 0.01 % Soln Commonly known as:  LUMIGAN Place 1 drop into both eyes at bedtime.   CALTRATE 600+D PO Take 1 tablet by mouth daily.   CENTRUM ADULTS PO Take 1 tablet by mouth daily.   cloNIDine 0.1 MG tablet Commonly known as:  CATAPRES Take 0.1 mg by mouth 2 (two) times daily.   dorzolamide-timolol 22.3-6.8 MG/ML ophthalmic solution Commonly known as:  COSOPT Place 1 drop into both eyes 2 (two) times daily.   glimepiride 2 MG tablet Commonly known as:  AMARYL Take 2 mg by mouth daily.    HYDROcodone-acetaminophen 5-325 MG tablet Commonly known as:  NORCO Take 1 tablet by mouth every 4 (four) hours as needed for moderate pain. What changed:  how much to take   ibuprofen 800 MG tablet Commonly known as:  ADVIL,MOTRIN Take 800 mg by mouth every 8 (eight) hours as needed for headache or moderate pain.   metFORMIN 500 MG tablet Commonly known as:  GLUCOPHAGE Take 1,000 mg by mouth 2 (two) times daily with a meal.   UNABLE TO FIND Rx: L8000- Post surgical bras (Quantity: 6) [id number]- Silicone Breast Prosthesis (Quantity: 1) Dx: 174.9; Right partial mastectomy      Follow-up Information    Floria Brandau B, MD. Schedule an appointment as soon as possible for a visit today.   Specialty:  General Surgery Why:  staple removal 10 days from surgery Contact information: [place] [address] [phone]           Signed: Pedro Earls 08/17/2016, 7:48 AM

## 2016-09-13 ENCOUNTER — Other Ambulatory Visit: Payer: Self-pay | Admitting: Orthopedic Surgery

## 2016-09-13 DIAGNOSIS — R531 Weakness: Secondary | ICD-10-CM

## 2016-09-13 DIAGNOSIS — M25612 Stiffness of left shoulder, not elsewhere classified: Secondary | ICD-10-CM

## 2016-09-13 DIAGNOSIS — R52 Pain, unspecified: Secondary | ICD-10-CM

## 2016-09-20 ENCOUNTER — Ambulatory Visit: Payer: Medicare Other | Admitting: Podiatry

## 2016-09-28 ENCOUNTER — Ambulatory Visit
Admission: RE | Admit: 2016-09-28 | Discharge: 2016-09-28 | Disposition: A | Discharge status: Home / Self Care | Payer: Medicare Other | Source: Ambulatory Visit | Attending: Orthopedic Surgery | Admitting: Orthopedic Surgery

## 2016-09-28 DIAGNOSIS — R52 Pain, unspecified: Secondary | ICD-10-CM

## 2016-09-28 DIAGNOSIS — R531 Weakness: Secondary | ICD-10-CM

## 2016-09-28 DIAGNOSIS — M25612 Stiffness of left shoulder, not elsewhere classified: Secondary | ICD-10-CM

## 2017-01-16 ENCOUNTER — Other Ambulatory Visit: Payer: Self-pay | Admitting: Surgery

## 2017-01-16 DIAGNOSIS — Z9011 Acquired absence of right breast and nipple: Secondary | ICD-10-CM

## 2017-01-16 DIAGNOSIS — Z1231 Encounter for screening mammogram for malignant neoplasm of breast: Secondary | ICD-10-CM

## 2017-01-28 ENCOUNTER — Encounter: Payer: Self-pay | Admitting: Neurology

## 2017-01-28 ENCOUNTER — Ambulatory Visit (INDEPENDENT_AMBULATORY_CARE_PROVIDER_SITE_OTHER): Payer: Medicare Other | Admitting: Neurology

## 2017-01-28 DIAGNOSIS — Z8673 Personal history of transient ischemic attack (TIA), and cerebral infarction without residual deficits: Secondary | ICD-10-CM | POA: Insufficient documentation

## 2017-01-28 DIAGNOSIS — I6300 Cerebral infarction due to thrombosis of unspecified precerebral artery: Secondary | ICD-10-CM | POA: Diagnosis not present

## 2017-01-28 DIAGNOSIS — R202 Paresthesia of skin: Secondary | ICD-10-CM

## 2017-01-28 DIAGNOSIS — I639 Cerebral infarction, unspecified: Secondary | ICD-10-CM | POA: Insufficient documentation

## 2017-01-28 MED ORDER — PREGABALIN 50 MG PO CAPS: 50.0000 mg | ORAL_CAPSULE | Freq: Three times a day (TID) | ORAL | 5 refills | Status: DC

## 2017-01-28 NOTE — Progress Notes (Signed)
PATIENT: Amanda Green DOB: 08-07-1935  Chief Complaint  Patient presents with  . Right leg numbness/tingling    Reports right leg numbness and tingling for 7-8 years.  The symptoms are constant and unrelieved by gabapentin 400mg , BID.     HISTORICAL  Amanda Green is a 82 years old right-handed female, seen in refer by  her primary care doctor L.Donnie Coffin, for evaluation of right leg numbness, initial evaluation was on January 28 2017.  I reviewed and summarized the referring note, she had a history of hypertension, hyperlipidemia, diabetes, right breast cancer, status post right breast lump in 1999, did not require chemotherapy or radiation therapy, had left total knee replacement, right carpal tunnel release in 2015  I saw her in 2013, for similar complaints, she had a history of left thalamic stroke in August 2010, we have personally reviewed MRI of the brain 2010, multiple supratentorial and small vessel disease, small left thalamic stroke, central pontine white matter changes. MRI of cervical spine, multilevel degenerative disc disease, mild canal stenosis, no cord signal changes.  She presented with sudden onset right arm, leg, right face paresthesia in August 2010, symptoms has been persistent since then, she complains of numbness tingling discomfort, 8 out of 10, over the years, she has tried gabapentin with limited improvement.  Today she comes in complains of persistent of right anterior thigh paresthesia, right shoulder pain, radiating discomfort to right arm. No longer have significant right facial paresthesia.  MRI of right shoulder in October 2017, complete tear of the super spinatus and infraspinatus tendon with 2.2 centimeter retraction, mild atrophy of the super spinatus and infraspinatus muscle. 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  REVIEW OF SYSTEMS: Full 14 system review of systems performed and notable only for as above  ALLERGIES: No Known Allergies  HOME MEDICATIONS: Current Outpatient Prescriptions  Medication Sig Dispense Refill  . amLODipine (NORVASC) 10 MG tablet Take 10 mg by mouth daily.     Marland Kitchen aspirin EC 81 MG tablet Take 81 mg by mouth daily.    Marland Kitchen atorvastatin (LIPITOR) 40 MG tablet Take 40 mg by mouth daily.     . bimatoprost (LUMIGAN) 0.01 % SOLN Place 1 drop into both eyes at bedtime.    . Calcium Carbonate-Vitamin D (CALTRATE 600+D PO) Take 1 tablet by mouth daily.     . cloNIDine (CATAPRES) 0.1 MG tablet Take 0.1 mg by mouth 2 (two) times daily.     . dorzolamide-timolol (COSOPT) 22.3-6.8 MG/ML ophthalmic solution Place 1 drop into both eyes 2 (two) times daily.    Marland Kitchen gabapentin (NEURONTIN) 400 MG capsule Take 400 mg by mouth 2 (two) times daily.    Marland Kitchen glimepiride (AMARYL) 2 MG tablet Take 2 mg by mouth daily.     . hydrochlorothiazide (HYDRODIURIL) 25 MG tablet Take 25 mg by mouth daily.    Marland Kitchen ibuprofen (ADVIL,MOTRIN) 800 MG tablet Take 800 mg by mouth every 8 (eight) hours as needed for headache or moderate pain.    . metFORMIN (GLUCOPHAGE) 500 MG tablet Take 1,000 mg by mouth  2 (two) times daily with a meal.     . Multiple Vitamins-Minerals (CENTRUM ADULTS PO) Take 1 tablet by mouth daily.      No current facility-administered medications for this visit.     PAST MEDICAL HISTORY: Past Medical History:  Diagnosis Date  . Arthritis   . Cancer [facility])    breast- right   . Diabetes mellitus   . Dislocation of left shoulder  joint   . Hyperlipidemia   . Hypertension   . Numbness and tingling of right lower extremity   . Stroke ([place])    light stroke - 2012 ,right leg nerve damage     PAST SURGICAL HISTORY: Past Surgical History:  Procedure Laterality Date  . ABDOMINAL HYSTERECTOMY    . APPENDECTOMY    . BREAST SURGERY     Right mastectomy 1999  . CARPAL TUNNEL RELEASE Right 09/30/2014   Procedure: RIGHT CARPAL TUNNEL RELEASE;  Surgeon: Daryll Brod, MD;  Location: [facility];  Service: Orthopedics;  Laterality: Right;  . COLONOSCOPY    . EYE SURGERY     cataracts  . JOINT REPLACEMENT     lt total knee 2011  . LIPOMA EXCISION  11/28/2011   Procedure: EXCISION LIPOMA;  Surgeon: Pedro Earls, MD;  Location: [facility];  Service: General;  Laterality: N/A;  excisino lipoma 4 cm back of neck    FAMILY HISTORY: Family History  Problem Relation Age of Onset  . Esophageal cancer Mother   . Other Father     Unsure of medical history.  . Prostate cancer Brother     SOCIAL HISTORY:  Social History   Social History  . Marital status: Married    Spouse name: N/A  . Number of children: 5  . Years of education: HS - 12 years   Occupational History  . Retired    Social History Main Topics  . Smoking status: Never Smoker  . Smokeless tobacco: Never Used  . Alcohol use No  . Drug use: No  . Sexual activity: Not on file   Other Topics Concern  . Not on file   Social History Narrative   Lives at home with her husband.   Right-handed.   1 cup caffeine daily.     PHYSICAL EXAM   Vitals:   01/28/17 0916  BP: 120/73  Pulse: 76  Weight: 149 lb (67.6 kg)  Height: 5\' 1"  (1.549 m)    Not recorded      Body mass index is 28.15 kg/m.  PHYSICAL EXAMNIATION:  Gen: NAD, conversant, well nourised, obese, well groomed                     Cardiovascular: Regular rate rhythm, no peripheral edema, warm, nontender. Eyes: Conjunctivae clear without exudates or  hemorrhage Neck: Supple, no carotid bruits. Pulmonary: Clear to auscultation bilaterally   NEUROLOGICAL EXAM:  MENTAL STATUS: Speech:    Speech is normal; fluent and spontaneous with normal comprehension.  Cognition:     Orientation to time, place and person     Normal recent and remote memory     Normal Attention span and concentration     Normal Language, naming, repeating,spontaneous speech     Fund of knowledge   CRANIAL NERVES: CN II: Visual fields are full to confrontation. Fundoscopic exam is normal with sharp discs and no vascular changes. Pupils are round equal and briskly reactive to light. CN III, IV, VI: extraocular movement are  normal. No ptosis. CN V: Facial sensation is intact to pinprick in all 3 divisions bilaterally. Corneal responses are intact.  CN VII: Face is symmetric with normal eye closure and smile. CN VIII: Hearing is normal to rubbing fingers CN IX, X: Palate elevates symmetrically. Phonation is normal. CN XI: Head turning and shoulder shrug are intact CN XII: Tongue is midline with normal movements and no atrophy.  MOTOR: Limited range of motion of right shoulder, no significant weakness. Muscle bulk and tone are normal. Muscle strength is normal.  REFLEXES: Reflexes are 2+ and symmetric at the biceps, triceps, knees, and ankles. Plantar responses are flexor.  SENSORY: Intact to light touch, pinprick, positional sensation and vibratory sensation are intact in fingers and toes.  COORDINATION: Rapid alternating movements and fine finger movements are intact. There is no dysmetria on finger-to-nose and heel-knee-shin.    GAIT/STANCE: Posture is normal. Gait is steady with normal steps, base, arm swing, and turning. Heel and toe walking are normal. Tandem gait is normal.  Romberg is absent.   DIAGNOSTIC DATA (LABS, IMAGING, TESTING) - I reviewed patient records, labs, notes, testing and imaging myself where available.   ASSESSMENT AND  PLAN  Amanda Green is a 82 y.o. female   Right hemiparesthesia involving right arm, and leg.  The right arm involvement likely related to her right rotator cuff syndrome.  She denies significant low back pain, but differentiation diagnosis of her right leg dominant anesthesia including right lumbar radiculopathy, status post left thalamic syndrome,  I have prescribed Lyrica 50 mg 3 times a day  EMG nerve conduction study  Marcial Pacas, M.D. Ph.D.  [facility] [address], [place], [place] [address] Ph: [phone] Fax: [phone]  CC: L.Dean Alroy Dust, MD

## 2017-02-19 ENCOUNTER — Ambulatory Visit
Admission: RE | Admit: 2017-02-19 | Discharge: 2017-02-19 | Disposition: A | Discharge status: Home / Self Care | Payer: Medicare Other | Source: Ambulatory Visit | Attending: Surgery | Admitting: Surgery

## 2017-02-19 DIAGNOSIS — Z1231 Encounter for screening mammogram for malignant neoplasm of breast: Secondary | ICD-10-CM

## 2017-02-19 DIAGNOSIS — Z9011 Acquired absence of right breast and nipple: Secondary | ICD-10-CM

## 2017-03-01 ENCOUNTER — Encounter (INDEPENDENT_AMBULATORY_CARE_PROVIDER_SITE_OTHER): Payer: Self-pay | Admitting: Neurology

## 2017-03-01 ENCOUNTER — Ambulatory Visit (INDEPENDENT_AMBULATORY_CARE_PROVIDER_SITE_OTHER): Payer: Medicare Other | Admitting: Neurology

## 2017-03-01 DIAGNOSIS — Z0289 Encounter for other administrative examinations: Secondary | ICD-10-CM

## 2017-03-01 DIAGNOSIS — R202 Paresthesia of skin: Secondary | ICD-10-CM

## 2017-03-01 DIAGNOSIS — I6300 Cerebral infarction due to thrombosis of unspecified precerebral artery: Secondary | ICD-10-CM

## 2017-03-01 MED ORDER — DULOXETINE HCL 60 MG PO CPEP: 60.0000 mg | ORAL_CAPSULE | Freq: Every day | ORAL | 12 refills | Status: DC

## 2017-03-01 NOTE — Progress Notes (Signed)
Lyrica has helped her right leg paresthesia some, but causing weight gain, gabapentin high-dose cause dizziness,  I have changed her to Cymbalta 60 mg daily  Today's electrodiagnostic study showed evidence of mild chronic lumbosacral radiculopathy, she denies significant low back pain, no bowel and bladder incontinence, no significant gait abnormality, she did has mild bilateral toe extension flexion weakness, length dependent sensory changes on examination.  She elected not to proceed with MRI of lumbar, will call clinic for worsening symptoms.

## 2017-03-01 NOTE — Procedures (Signed)
Full Name: Amanda Green Gender: Female MRN #: [id number] Date of Birth: Aug 05, 1935    Visit Date: 03/01/2017 11:27 Age: 82 Years 15 Months Old Examining Physician: Marcial Pacas, MD  Referring Physician: Krista Blue History: 82 years old right-handed female, with history of left thalamic stroke, right rotator cuff syndrome, complains of right leg paresthesia, radiating to right lateral spine. She also had a history of left knee replacement.  Summary of the test:  Nerve conduction study: Right sural, superficial peroneal, median, ulnar sensory responses were normal. Right median, ulnar, peroneal to EDB and tibial motor responses were normal.  Electromyography: Selective needle examination of bilateral lower extremity muscles showed mild chronic neuropathic changes, there was no evidence of active denervation at and bilateral lumbar sacral paraspinal muscles.  Selected needle examination of right upper extremity, right cervical paraspinal muscles was normal.     Conclusion: This is a mild abnormal study. There is no electrodiagnostic evidence of large fiber peripheral neuropathy. There is evidence of chronic bilateral lumbar sacral radiculopathy. There is no evidence of right cervical radiculopathy, or right upper extremity neuropathy.     ------------------------------- Marcial Pacas, M.D.  [facility] [place], [place] [address] Tel: [phone] Fax: [phone]        [facility]    Nerve / Sites Rec. Site Peak Lat Ref.  Amp Ref. Segments Distance    ms ms V V  cm  R Median - Orthodromic (Dig II, Mid palm)     Dig II Wrist 3.2 ?3.4 16 ?10 Dig II - Wrist 13  R Ulnar - Orthodromic, (Dig V, Mid palm)     Dig V Wrist 2.9 ?3.1 6 ?5 Dig V - Wrist 11  R Sural - Ankle (Calf)     Calf Ankle 3.4 ?4.4 5 ?6 Calf - Ankle 14  R Superficial peroneal - Ankle     Lat leg Ankle 3.7 ?4.4 5 ?6 Lat leg - Ankle 14             [facility]    Nerve / Sites Rec. Site Latency Ref.  Amplitude Ref. Rel Amp Segments Distance Velocity Ref. Area    ms ms mV mV %  cm m/s m/s mVms  R Median - APB     Wrist APB 3.9 ?4.4 4.3 ?4.0 100 Wrist - APB 7   14.7     Upper arm APB 8.0  4.1  96.1 Upper arm - Wrist 20 49 ?49 15.6  R Ulnar - ADM     Wrist ADM 2.8 ?3.3 6.5 ?6.0 100 Wrist - ADM 7   21.5     B.Elbow ADM 6.2  6.2  94.3 B.Elbow - Wrist 18 53 ?49 24.1     A.Elbow ADM 8.3  5.6  90.5 A.Elbow - B.Elbow 11 52 ?49 22.7         A.Elbow - Wrist      R Peroneal - EDB     Ankle EDB 5.1 ?6.5 2.5 ?2.0 100 Ankle - EDB 9   5.8     Fib head EDB 10.9  2.1  85.4 Fib head - Ankle 26 44 ?44 6.3     Pop fossa EDB 13.1  2.1  98.1 Pop fossa - Fib head 10 46 ?44 6.5         Pop fossa - Ankle      R Tibial - AH     Ankle AH 4.8 ?5.8 5.1 ?4.0 100 Ankle -  AH 9   11.1     Pop fossa AH 13.2  4.6  90.1 Pop fossa - Ankle 34 41 ?41 9.2             F  Wave    Nerve F Lat Ref.   ms ms  R Ulnar - ADM 27.8 ?32.0  R Tibial - AH 53.7 ?56.0         EMG full       EMG Summary Table    Spontaneous MUAP Recruitment  Muscle IA Fib PSW Fasc Other Amp Dur. Poly Pattern  R. Tibialis anterior Normal None None None _______ Increased Normal Normal Reduced  R. Gastrocnemius (Medial head) Normal None None None _______ Increased Normal Normal Reduced  R. Vastus lateralis Normal None None None _______ Increased Normal Normal Reduced  L. Tibialis anterior Normal None None None _______ Increased Normal Normal Reduced  L. Vastus lateralis Normal None None None _______ Increased Normal Normal Reduced  L. Gastrocnemius (Medial head) Normal None None None _______ Increased Normal Normal Reduced  R. Biceps femoris (short head) Normal None None None _______ Normal Increased Normal Normal  R. Lumbar paraspinals (low) Normal None None None _______ Normal Normal Normal Normal  R. Lumbar paraspinals (mid) Normal None None None _______ Normal Normal Normal Normal  L. Lumbar paraspinals (mid) Normal None None None _______  Normal Normal Normal Normal  L. Lumbar paraspinals (low) Normal None None None _______ Normal Normal Normal Normal  R. Cervical paraspinals Normal None None None _______ Normal Normal Normal Normal  R. Pronator teres Normal None None None _______ Normal Normal Normal Normal  R. Deltoid Normal None None None _______ Normal Normal Normal Normal  R. Biceps brachii Normal None None None _______ Normal Normal Normal Normal  R. First dorsal interosseous Normal None None None _______ Normal Normal Normal Normal  R. Extensor digitorum communis Normal None None None _______ Normal Normal Normal Normal

## 2018-01-10 ENCOUNTER — Other Ambulatory Visit: Payer: Self-pay | Admitting: Family Medicine

## 2018-01-10 DIAGNOSIS — Z1231 Encounter for screening mammogram for malignant neoplasm of breast: Secondary | ICD-10-CM

## 2018-02-20 ENCOUNTER — Ambulatory Visit
Admission: RE | Admit: 2018-02-20 | Discharge: 2018-02-20 | Disposition: A | Discharge status: Home / Self Care | Payer: Medicare Other | Source: Ambulatory Visit | Attending: Family Medicine | Admitting: Family Medicine

## 2018-02-20 DIAGNOSIS — Z1231 Encounter for screening mammogram for malignant neoplasm of breast: Secondary | ICD-10-CM

## 2018-08-13 DIAGNOSIS — R52 Pain, unspecified: Secondary | ICD-10-CM | POA: Insufficient documentation

## 2018-08-13 DIAGNOSIS — M65342 Trigger finger, left ring finger: Secondary | ICD-10-CM | POA: Insufficient documentation

## 2019-01-20 ENCOUNTER — Other Ambulatory Visit: Payer: Self-pay | Admitting: Family Medicine

## 2019-01-20 DIAGNOSIS — Z1231 Encounter for screening mammogram for malignant neoplasm of breast: Secondary | ICD-10-CM

## 2019-02-13 ENCOUNTER — Other Ambulatory Visit: Payer: Self-pay | Admitting: Family Medicine

## 2019-02-13 ENCOUNTER — Other Ambulatory Visit: Payer: Self-pay

## 2019-02-13 DIAGNOSIS — M8580 Other specified disorders of bone density and structure, unspecified site: Secondary | ICD-10-CM

## 2019-02-24 ENCOUNTER — Ambulatory Visit
Admission: RE | Admit: 2019-02-24 | Discharge: 2019-02-24 | Disposition: A | Discharge status: Home / Self Care | Payer: Medicare Other | Source: Ambulatory Visit | Attending: Family Medicine | Admitting: Family Medicine

## 2019-02-24 DIAGNOSIS — Z1231 Encounter for screening mammogram for malignant neoplasm of breast: Secondary | ICD-10-CM

## 2019-02-26 ENCOUNTER — Other Ambulatory Visit: Payer: Self-pay | Admitting: Family Medicine

## 2019-02-26 DIAGNOSIS — R928 Other abnormal and inconclusive findings on diagnostic imaging of breast: Secondary | ICD-10-CM

## 2019-03-04 ENCOUNTER — Ambulatory Visit
Admission: RE | Admit: 2019-03-04 | Discharge: 2019-03-04 | Disposition: A | Discharge status: Home / Self Care | Payer: Medicare Other | Source: Ambulatory Visit | Attending: Family Medicine | Admitting: Family Medicine

## 2019-03-04 ENCOUNTER — Other Ambulatory Visit: Payer: Self-pay

## 2019-03-04 ENCOUNTER — Other Ambulatory Visit: Payer: Self-pay | Admitting: Family Medicine

## 2019-03-04 DIAGNOSIS — R921 Mammographic calcification found on diagnostic imaging of breast: Secondary | ICD-10-CM

## 2019-03-04 DIAGNOSIS — R928 Other abnormal and inconclusive findings on diagnostic imaging of breast: Secondary | ICD-10-CM

## 2019-03-09 ENCOUNTER — Ambulatory Visit: Payer: Self-pay | Admitting: Surgery

## 2019-03-09 DIAGNOSIS — D0512 Intraductal carcinoma in situ of left breast: Secondary | ICD-10-CM

## 2019-03-11 ENCOUNTER — Other Ambulatory Visit: Payer: Self-pay | Admitting: Surgery

## 2019-03-11 DIAGNOSIS — D0512 Intraductal carcinoma in situ of left breast: Secondary | ICD-10-CM

## 2019-03-16 ENCOUNTER — Encounter: Payer: Self-pay | Admitting: *Deleted

## 2019-03-17 ENCOUNTER — Encounter: Payer: Self-pay | Admitting: Oncology

## 2019-03-23 ENCOUNTER — Other Ambulatory Visit: Payer: Self-pay

## 2019-03-23 ENCOUNTER — Encounter (HOSPITAL_BASED_OUTPATIENT_CLINIC_OR_DEPARTMENT_OTHER): Payer: Self-pay | Admitting: *Deleted

## 2019-03-27 ENCOUNTER — Telehealth: Payer: Self-pay | Admitting: *Deleted

## 2019-03-27 NOTE — Telephone Encounter (Signed)
Spoke to pt concerning med/rad onc appt prior to sx. Surgery has been delayed to 6/5. Dr. Hassell Done started pt on Tamoxifen. Informed pt that med/rad on appts will be scheduled after sx. Received verbal understanding. Msg sent to have appt r/s

## 2019-03-31 ENCOUNTER — Encounter: Payer: Self-pay | Admitting: Oncology

## 2019-03-31 ENCOUNTER — Telehealth: Payer: Self-pay | Admitting: Oncology

## 2019-03-31 NOTE — Telephone Encounter (Signed)
Pt has been rescheduled to see Dr. Jana Hakim on 6/24 at [place] letter mailed.

## 2019-04-13 ENCOUNTER — Other Ambulatory Visit: Payer: Medicare Other

## 2019-04-13 ENCOUNTER — Ambulatory Visit: Payer: Medicare Other | Admitting: Oncology

## 2019-04-15 ENCOUNTER — Institutional Professional Consult (permissible substitution): Payer: Medicare Other | Admitting: Radiation Oncology

## 2019-04-22 ENCOUNTER — Other Ambulatory Visit: Payer: Medicare Other

## 2019-05-18 DIAGNOSIS — C50912 Malignant neoplasm of unspecified site of left female breast: Secondary | ICD-10-CM

## 2019-05-18 HISTORY — DX: Malignant neoplasm of unspecified site of left female breast: C50.912

## 2019-05-25 ENCOUNTER — Encounter: Payer: Self-pay | Admitting: Podiatry

## 2019-05-25 ENCOUNTER — Telehealth: Payer: Self-pay | Admitting: *Deleted

## 2019-05-25 ENCOUNTER — Other Ambulatory Visit: Payer: Self-pay

## 2019-05-25 ENCOUNTER — Ambulatory Visit: Payer: Medicare Other | Admitting: Podiatry

## 2019-05-25 VITALS — Temp 97.7°F

## 2019-05-25 DIAGNOSIS — B351 Tinea unguium: Secondary | ICD-10-CM | POA: Diagnosis not present

## 2019-05-25 DIAGNOSIS — M79675 Pain in left toe(s): Secondary | ICD-10-CM | POA: Diagnosis not present

## 2019-05-25 DIAGNOSIS — L601 Onycholysis: Secondary | ICD-10-CM | POA: Diagnosis not present

## 2019-05-25 DIAGNOSIS — M79674 Pain in right toe(s): Secondary | ICD-10-CM

## 2019-05-25 DIAGNOSIS — R0989 Other specified symptoms and signs involving the circulatory and respiratory systems: Secondary | ICD-10-CM

## 2019-05-25 DIAGNOSIS — I739 Peripheral vascular disease, unspecified: Secondary | ICD-10-CM | POA: Diagnosis not present

## 2019-05-25 NOTE — Telephone Encounter (Signed)
-----   Message from Trula Slade, DPM sent at 05/25/2019 10:21 AM EDT ----- Can you please order arterial studies due to decreased pulses? ABI was abnormal in the office

## 2019-05-25 NOTE — Progress Notes (Signed)
Subjective:   Patient ID: Amanda Green, female   DOB: 84 y.o.   MRN: [id number]   HPI 84 year old female presents the office today for concerns of her right big toenail becoming loose.  She denies any pain or swelling or any redness or drainage or pus.  She also articulates her nails be trimmed.  She denies any recent injury or trauma to her feet.  She has no other concerns today.   Review of Systems  All other systems reviewed and are negative.  Past Medical History:  Diagnosis Date  . Arthritis   . Cancer [facility])    breast- right   . Diabetes mellitus   . Dislocation of left shoulder joint   . Hyperlipidemia   . Hypertension   . Numbness and tingling of right lower extremity   . Stroke [facility])    light stroke - 2012 ,right leg nerve damage     Past Surgical History:  Procedure Laterality Date  . ABDOMINAL HYSTERECTOMY    . APPENDECTOMY    . BREAST BIOPSY Left 01/19/2010  . BREAST SURGERY     Right mastectomy 1999  . CARPAL TUNNEL RELEASE Right 09/30/2014   Procedure: RIGHT CARPAL TUNNEL RELEASE;  Surgeon: Daryll Brod, MD;  Location: [place];  Service: Orthopedics;  Laterality: Right;  . COLONOSCOPY    . EYE SURGERY     cataracts  . JOINT REPLACEMENT     lt total knee 2011  . LIPOMA EXCISION  11/28/2011   Procedure: EXCISION LIPOMA;  Surgeon: Pedro Earls, MD;  Location: [place];  Service: General;  Laterality: N/A;  excisino lipoma 4 cm back of neck  . MASTECTOMY Right 1999     Current Outpatient Medications:  .  amLODipine (NORVASC) 10 MG tablet, Take 10 mg by mouth daily. , Disp: , Rfl:  .  aspirin EC 81 MG tablet, Take 81 mg by mouth daily., Disp: , Rfl:  .  atorvastatin (LIPITOR) 40 MG tablet, Take 40 mg by mouth daily. , Disp: , Rfl:  .  bimatoprost (LUMIGAN) 0.01 % SOLN, Place 1 drop into both eyes at bedtime., Disp: , Rfl:  .  Calcium Carbonate-Vitamin D (CALTRATE 600+D PO), Take 1 tablet by mouth daily. , Disp: , Rfl:   .  cloNIDine (CATAPRES) 0.1 MG tablet, Take 0.1 mg by mouth 2 (two) times daily. , Disp: , Rfl:  .  dorzolamide-timolol (COSOPT) 22.3-6.8 MG/ML ophthalmic solution, Place 1 drop into both eyes 2 (two) times daily., Disp: , Rfl:  .  glimepiride (AMARYL) 2 MG tablet, Take 2 mg by mouth daily. , Disp: , Rfl:  .  hydrochlorothiazide (HYDRODIURIL) 25 MG tablet, Take 25 mg by mouth daily., Disp: , Rfl:  .  metFORMIN (GLUCOPHAGE) 500 MG tablet, Take 1,000 mg by mouth 2 (two) times daily with a meal. , Disp: , Rfl:  .  Multiple Vitamins-Minerals (CENTRUM ADULTS PO), Take 1 tablet by mouth daily. , Disp: , Rfl:   No Known Allergies        Objective:  Physical Exam  General: AAO x3, NAD  Dermatological: Overall nails are hypertrophic, dystrophic with yellow-brown discoloration.  The nail on the right hallux is loose from the underlying nail bed.  The nails do cause some irritation inside the shoes but there is no edema, erythema, drainage or pus or any signs of infection.  No open lesions.  Vascular: DP pulses 2/4, PT pulses decreased.  There is no pain with  calf compression, swelling, warmth, erythema.   Neruologic: Grossly intact via light touch bilateral. Protective threshold with Semmes Wienstein monofilament intact to all pedal sites bilateral.   Musculoskeletal: No gross boney pedal deformities bilateral. No pain, crepitus, or limitation noted with foot and ankle range of motion bilateral. Muscular strength 5/5 in all groups tested bilateral.  Gait: Unassisted, Nonantalgic.       Assessment:   84 year old female onycholysis, symptomatic onychomycosis; concern for PAD    Plan:  -Treatment options discussed including all alternatives, risks, and complications -Etiology of symptoms were discussed -I was going to do a total nail removal on the right hallux toenail however prior to this given her stroke I did an ABI in the office which was read as "PAD".  Due to this I held off on the  total nail avulsion.  Will order arterial studies. -Nails debrided 10 without complications or bleeding. -Daily foot inspection -Follow-up in 3 months or sooner if any problems arise. In the meantime, encouraged to call the office with any questions, concerns, change in symptoms.   Celesta Gentile, DPM

## 2019-05-26 ENCOUNTER — Telehealth: Payer: Self-pay | Admitting: *Deleted

## 2019-05-26 NOTE — Addendum Note (Signed)
Addended by: Cranford Mon R on: 05/26/2019 10:29 AM   Modules accepted: Orders

## 2019-05-26 NOTE — Telephone Encounter (Signed)
-----   Message from Trula Slade, DPM sent at 05/25/2019 10:21 AM EDT ----- Can you please order arterial studies due to decreased pulses? ABI was abnormal in the office

## 2019-05-26 NOTE — Telephone Encounter (Signed)
Orders sent to [facility] 05/25/2019.

## 2019-06-03 ENCOUNTER — Other Ambulatory Visit: Payer: Self-pay | Admitting: Podiatry

## 2019-06-03 DIAGNOSIS — R0989 Other specified symptoms and signs involving the circulatory and respiratory systems: Secondary | ICD-10-CM

## 2019-06-09 ENCOUNTER — Telehealth: Payer: Self-pay | Admitting: Oncology

## 2019-06-09 NOTE — Progress Notes (Signed)
[place]  Telephone:([phone] Fax:([phone]) [phone]     ID: Amanda Green DOB: 1935/12/09  MR#: [id number]  UUV#:[id number]  Patient Care Team: Alroy Dust, Carlean Jews.[name], MD as PCP - General (Family Medicine) Johnathan Hausen, MD as Consulting Physician (General Surgery) Magrinat, Virgie Dad, MD as Consulting Physician (Oncology) Gery Pray, MD as Consulting Physician (Radiation Oncology) Chauncey Cruel, MD OTHER MD:  CHIEF COMPLAINT: Estrogen receptor positive ductal carcinoma in situ  CURRENT TREATMENT: Tamoxifen  I connected with Amanda Green on 06/10/19 at 11:00 AM EDT by telephone visit and verified that I am speaking with the correct person using two identifiers.   I discussed the limitations, risks, security and privacy concerns of performing an evaluation and management service by telemedicine and the availability of in-person appointments. I also discussed with the patient that there may be a patient responsible charge related to this service. The patient expressed understanding and agreed to proceed.   Other persons participating in the visit and their role in the encounter: scribe KLD  Patient's location: home Provider's location: clinic   HISTORY OF CURRENT ILLNESS: Amanda Green had routine left screening mammography on 02/24/2019 showing a possible abnormality. Of note, she has a history of right mastectomy in 1999 under Dr. Hassell Done.   She underwent left diagnostic mammography at [facility] on 03/04/2019 showing: breast density category B; indeterminate calcifications in the upper-outer left breast measuring 2.9 cm.  Accordingly on 03/04/2019 she proceeded to biopsy of the left breast area in question. The pathology from this procedure ([id number]) showed: ductal carcinoma in situ with calcifications, grade 3. Prognostic indicators significant for: estrogen receptor, 90% positive with strong staining intensity and progesterone receptor,  0% negative.  She was started on tamoxifen shortly after by Dr. Hassell Done due to surgery delay for pandemic concerns.  She is scheduled for definitive left breast surgery 07/17/2019  The patient's subsequent history is as detailed below.   INTERVAL HISTORY: Amanda Green was evaluated in the breast cancer clinic on 06/10/2019.   She continues on tamoxifen. She tolerates this well. She denies hot flashes and vaginal dryness or wetness.  She is scheduled to undergo bone density testing on 06/22/2019 and left breast lumpectomy on 07/17/2019.   REVIEW OF SYSTEMS: Amanda Green reports wearing a mask when she goes out. She does her own shopping. There were no specific symptoms leading to the original mammogram, which was routinely scheduled. The patient denies unusual headaches, visual changes, nausea, vomiting, stiff neck, dizziness, or gait imbalance. There has been no cough, phlegm production, or pleurisy, no chest pain or pressure, and no change in bowel or bladder habits. The patient denies fever, rash, bleeding, unexplained fatigue or unexplained weight loss. A detailed review of systems was otherwise entirely negative.   PAST MEDICAL HISTORY: Past Medical History:  Diagnosis Date  . Arthritis   . Cancer [facility])    breast- right   . Diabetes mellitus   . Dislocation of left shoulder joint   . Hyperlipidemia   . Hypertension   . Numbness and tingling of right lower extremity   . Stroke ([place])    light stroke - 2012 ,right leg nerve damage     PAST SURGICAL HISTORY: Past Surgical History:  Procedure Laterality Date  . ABDOMINAL HYSTERECTOMY    . APPENDECTOMY    . BREAST BIOPSY Left 01/19/2010  . BREAST SURGERY     Right mastectomy 1999  . CARPAL TUNNEL RELEASE Right 09/30/2014   Procedure: RIGHT CARPAL TUNNEL RELEASE;  Surgeon: Daryll Brod, MD;  Location: [place];  Service: Orthopedics;  Laterality: Right;  . COLONOSCOPY    . EYE SURGERY     cataracts  . JOINT REPLACEMENT     lt  total knee 2011  . LIPOMA EXCISION  11/28/2011   Procedure: EXCISION LIPOMA;  Surgeon: Pedro Earls, MD;  Location: [place];  Service: General;  Laterality: N/A;  excisino lipoma 4 cm back of neck  . MASTECTOMY Right 1999    FAMILY HISTORY: Family History  Problem Relation Age of Onset  . Esophageal cancer Mother   . Other Father        Unsure of medical history.  . Prostate cancer Brother   Patient's father died from lung cancer. Patient's mother died from esophageal cancer. The patient denies a family hx of breast or ovarian cancer. She has 1 brother. He was diagnosed with prostate cancer.   GYNECOLOGIC HISTORY:  No LMP recorded. Patient is postmenopausal. Menarche: 84 years old Age at first live birth: 84 years old [place] P 5 LMP age 89 Hysterectomy? Yes, age 62, uterus only BSO? No Hormone replacement?   SOCIAL HISTORY: (updated 06/10/2019)  Amanda Green is currently retired. She is widowed. She lives at home with her granddaughter. Her granddaughter is 103 and works at the [employer].     ADVANCED DIRECTIVES: not in place; she plans to name her daughter(s).   HEALTH MAINTENANCE: Social History   Tobacco Use  . Smoking status: Never Smoker  . Smokeless tobacco: Never Used  Substance Use Topics  . Alcohol use: No  . Drug use: No     Colonoscopy: 03/2011, Dr. Penelope Coop, benign polyp  PAP: not on file, remote abdominal hysterectomy  Bone density: scheduled for 06/22/2019   No Known Allergies  Current Outpatient Medications  Medication Sig Dispense Refill  . amLODipine (NORVASC) 10 MG tablet Take 10 mg by mouth daily.     Marland Kitchen aspirin EC 81 MG tablet Take 81 mg by mouth daily.    Marland Kitchen atorvastatin (LIPITOR) 40 MG tablet Take 40 mg by mouth daily.     . bimatoprost (LUMIGAN) 0.01 % SOLN Place 1 drop into both eyes at bedtime.    . brimonidine-timolol (COMBIGAN) 0.2-0.5 % ophthalmic solution Place 1 drop into both eyes every 12 (twelve) hours.    . brinzolamide  (AZOPT) 1 % ophthalmic suspension 1 drop 3 (three) times daily.    . Calcium Carbonate-Vitamin D (CALTRATE 600+D PO) Take 1 tablet by mouth daily.     . cloNIDine (CATAPRES) 0.1 MG tablet Take 0.1 mg by mouth 2 (two) times daily.     . dorzolamide-timolol (COSOPT) 22.3-6.8 MG/ML ophthalmic solution Place 1 drop into both eyes 2 (two) times daily.    . DULoxetine (CYMBALTA) 60 MG capsule Take 60 mg by mouth daily.    Marland Kitchen gabapentin (NEURONTIN) 400 MG capsule Take 400 mg by mouth 3 (three) times daily.    Marland Kitchen glimepiride (AMARYL) 2 MG tablet Take 2 mg by mouth daily.     . hepatitis A vaccine (VAQTA) 50 UNIT/ML injection Inject 50 Units into the muscle once.    . hydrochlorothiazide (HYDRODIURIL) 25 MG tablet Take 25 mg by mouth daily.    Marland Kitchen ibuprofen (ADVIL) 800 MG tablet Take 800 mg by mouth every 8 (eight) hours as needed.    . latanoprost (XALATAN) 0.005 % ophthalmic solution 1 drop at bedtime.    . metFORMIN (GLUCOPHAGE) 500 MG tablet Take 1,000  mg by mouth 2 (two) times daily with a meal.     . Multiple Vitamins-Minerals (CENTRUM ADULTS PO) Take 1 tablet by mouth daily.      No current facility-administered medications for this visit.     OBJECTIVE: Older white woman in no acute distress  There were no vitals filed for this visit.   There is no height or weight on file to calculate BMI.   Wt Readings from Last 3 Encounters:  01/28/17 149 lb (67.6 kg)  08/15/16 152 lb (68.9 kg)  08/13/16 152 lb (68.9 kg)      ECOG FS:1 - Symptomatic but completely ambulatory   LAB RESULTS:  CMP     Component Value Date/Time   NA 140 08/13/2016 1330   K 3.8 08/13/2016 1330   CL 106 08/13/2016 1330   CO2 27 08/13/2016 1330   GLUCOSE 98 08/13/2016 1330   BUN 11 08/13/2016 1330   CREATININE 0.65 08/13/2016 1330   CALCIUM 10.3 08/13/2016 1330   PROT 6.9 03/23/2010 1155   ALBUMIN 4.1 03/23/2010 1155   AST 19 03/23/2010 1155   ALT 19 03/23/2010 1155   ALKPHOS 72 03/23/2010 1155   BILITOT 0.6  03/23/2010 1155   GFRNONAA >60 08/13/2016 1330   GFRAA >60 08/13/2016 1330    No results found for: TOTALPROTELP, ALBUMINELP, A1GS, A2GS, BETS, BETA2SER, GAMS, MSPIKE, SPEI  No results found for: KPAFRELGTCHN, LAMBDASER, KAPLAMBRATIO  Lab Results  Component Value Date   WBC 6.6 08/13/2016   NEUTROABS 4.0 03/23/2010   HGB 12.3 08/13/2016   HCT 37.0 08/13/2016   MCV 85.8 08/13/2016   PLT 332 08/13/2016    _0 @  No results found for: LABCA2  No components found for: [id number]  No results for input(s): INR in the last 168 hours.  No results found for: LABCA2  No results found for: YIF027  No results found for: XAJ287  No results found for: OMV672  No results found for: CA2729  No components found for: HGQUANT  No results found for: CEA1 / No results found for: CEA1   No results found for: AFPTUMOR  No results found for: CHROMOGRNA  No results found for: PSA1  No visits with results within 3 Day(s) from this visit.  Latest known visit with results is:  Admission on 08/15/2016, Discharged on 08/17/2016  Component Date Value Ref Range Status  . Glucose-Capillary 08/15/2016 93  65 - 99 mg/dL Final  . Glucose-Capillary 08/15/2016 216* 65 - 99 mg/dL Final  . Glucose-Capillary 08/15/2016 199* 65 - 99 mg/dL Final  . Comment 1 08/15/2016 Notify RN   Final  . Comment 2 08/15/2016 Document in Chart   Final  . Glucose-Capillary 08/15/2016 164* 65 - 99 mg/dL Final  . Glucose-Capillary 08/15/2016 301* 65 - 99 mg/dL Final  . Glucose-Capillary 08/16/2016 230* 65 - 99 mg/dL Final  . Glucose-Capillary 08/16/2016 194* 65 - 99 mg/dL Final  . Glucose-Capillary 08/16/2016 140* 65 - 99 mg/dL Final  . Glucose-Capillary 08/16/2016 187* 65 - 99 mg/dL Final  . Glucose-Capillary 08/16/2016 271* 65 - 99 mg/dL Final  . Glucose-Capillary 08/16/2016 185* 65 - 99 mg/dL Final  . Glucose-Capillary 08/17/2016 133* 65 - 99 mg/dL Final  . Glucose-Capillary 08/17/2016 143* 65 - 99  mg/dL Final  . Glucose-Capillary 08/17/2016 151* 65 - 99 mg/dL Final    (this displays the last labs from the last 3 days)  No results found for: TOTALPROTELP, ALBUMINELP, A1GS, A2GS, BETS, BETA2SER, GAMS, MSPIKE, SPEI (this displays SPEP  labs)  No results found for: KPAFRELGTCHN, LAMBDASER, KAPLAMBRATIO (kappa/lambda light chains)  No results found for: HGBA, HGBA2QUANT, HGBFQUANT, HGBSQUAN (Hemoglobinopathy evaluation)   No results found for: LDH  No results found for: IRON, TIBC, IRONPCTSAT (Iron and TIBC)  No results found for: FERRITIN  Urinalysis    Component Value Date/Time   COLORURINE YELLOW 03/23/2010 [place] 03/23/2010 1156   LABSPEC 1.011 03/23/2010 1156   PHURINE 7.0 03/23/2010 1156   GLUCOSEU NEGATIVE 03/23/2010 1156   HGBUR NEGATIVE 03/23/2010 [place] 03/23/2010 1156   KETONESUR NEGATIVE 03/23/2010 1156   PROTEINUR NEGATIVE 03/23/2010 1156   UROBILINOGEN 0.2 03/23/2010 1156   NITRITE NEGATIVE 03/23/2010 1156   LEUKOCYTESUR SMALL (A) 03/23/2010 1156     STUDIES: No results found.  ELIGIBLE FOR AVAILABLE RESEARCH PROTOCOL: no  ASSESSMENT: 84 y.o. [place] woman  (1) status post right mastectomy 1999  (2) status post left breast biopsy 03/04/2019 for a clinical 2.9 cm noninvasive breast cancer (ductal carcinoma in situ), grade 3, estrogen receptor positive, progesterone receptor negative  (3) tamoxifen started March 2020  (4) definitive surgery scheduled for 07/17/2019  (5) genetics testing to be scheduled  PLAN: Amanda Green has a remote history of right-sided breast cancer.  We will try to obtain that data.  Now she has a left-sided noninvasive breast cancer. Amanda Green understands that in noninvasive ductal carcinoma, also called ductal carcinoma in situ ("DCIS") the breast cancer cells remain trapped in the ducts were they started. They cannot travel to a vital organ. For that reason these cancers in themselves are  not life-threatening.  If the whole breast is removed then all the ducts are removed and since the cancer cells are trapped in the ducts, the cure rate with mastectomy for noninvasive breast cancer is approximately 99%. Nevertheless we recommend lumpectomy, because there is no survival advantage to mastectomy and because the cosmetic result is generally superior with breast conservation.  Since the patient is keeping her breast, there will be some risk of recurrence. The recurrence can only be in the same breast since, again, the cells are trapped in the ducts. There is no connection from one breast to the other. The risk of local recurrence is cut by more than half with radiation, which is standard in this situation.  In estrogen receptor positive cancers like Amanda Green, anti-estrogens can also be considered. They will further reduce the risk of recurrence by one half. In addition anti-estrogens will lower the risk of a new breast cancer developing in either breast, also by one half.   Amanda Green for genetics testing. In patients who carry a deleterious mutation [for example in a  BRCA gene], the risk of a new breast cancer developing in the future may be sufficiently great that the patient may choose bilateral mastectomies. However if she wishes to keep her breasts in that situation it is safe to do so. That would require intensified screening, which generally means not only yearly mammography but a yearly breast MRI as well.   The patient was started on tamoxifen neoadjuvantly given the current pandemic which has delayed her surgery.  I reviewed with her the fact that tamoxifen can cause abnormal clotting and therefore I asked her to stop the tamoxifen on 06/30/2019.  She will have her surgery 2 weeks later and then she will resume tamoxifen 07/27/2019  She will see me in September.  I will arrange for her to have genetics counseling at the same time.  Amanda Green has a good understanding of the  overall plan. She agrees with it. She knows the goal of treatment in her case is cure. She will call with any problems that may develop before her next visit here.   Chauncey Cruel, MD   06/10/2019 11:27 AM Medical Oncology and Hematology [facility] [address] [place], [place] [address] Tel. [phone]    Fax. [phone]   This document serves as a record of services personally performed by Lurline Del, MD. It was created on his behalf by Wilburn Mylar, a trained medical scribe. The creation of this record is based on the scribe's personal observations and the provider's statements to them.   I, Lurline Del MD, have reviewed the above documentation for accuracy and completeness, and I agree with the above.

## 2019-06-09 NOTE — Telephone Encounter (Signed)
Called patient regarding upcoming Webex appointment, patient does not have access for Webex and needs this to be a telephone visit.  °

## 2019-06-09 NOTE — Telephone Encounter (Signed)
Contacted pt to verify telephone visit for pre reg °

## 2019-06-10 ENCOUNTER — Inpatient Hospital Stay: Payer: Medicare Other | Attending: Oncology | Admitting: Oncology

## 2019-06-10 ENCOUNTER — Ambulatory Visit: Payer: Medicare Other

## 2019-06-10 ENCOUNTER — Encounter: Payer: Self-pay | Admitting: *Deleted

## 2019-06-10 ENCOUNTER — Ambulatory Visit
Admission: RE | Admit: 2019-06-10 | Discharge: 2019-06-10 | Disposition: A | Discharge status: Home / Self Care | Payer: Medicare Other | Source: Ambulatory Visit | Attending: Radiation Oncology | Admitting: Radiation Oncology

## 2019-06-10 DIAGNOSIS — E119 Type 2 diabetes mellitus without complications: Secondary | ICD-10-CM | POA: Diagnosis not present

## 2019-06-10 DIAGNOSIS — I10 Essential (primary) hypertension: Secondary | ICD-10-CM | POA: Diagnosis not present

## 2019-06-10 DIAGNOSIS — Z7982 Long term (current) use of aspirin: Secondary | ICD-10-CM | POA: Diagnosis not present

## 2019-06-10 DIAGNOSIS — D0512 Intraductal carcinoma in situ of left breast: Secondary | ICD-10-CM

## 2019-06-10 DIAGNOSIS — C50919 Malignant neoplasm of unspecified site of unspecified female breast: Secondary | ICD-10-CM | POA: Insufficient documentation

## 2019-06-10 DIAGNOSIS — Z79899 Other long term (current) drug therapy: Secondary | ICD-10-CM

## 2019-06-11 ENCOUNTER — Telehealth: Payer: Self-pay | Admitting: Oncology

## 2019-06-11 NOTE — Telephone Encounter (Signed)
I talk with patient regarding schedule  

## 2019-06-16 ENCOUNTER — Ambulatory Visit (HOSPITAL_COMMUNITY)
Admission: RE | Admit: 2019-06-16 | Discharge: 2019-06-16 | Disposition: A | Discharge status: Home / Self Care | Payer: Medicare Other | Source: Ambulatory Visit | Attending: Cardiology | Admitting: Cardiology

## 2019-06-16 ENCOUNTER — Other Ambulatory Visit: Payer: Self-pay

## 2019-06-16 DIAGNOSIS — R0989 Other specified symptoms and signs involving the circulatory and respiratory systems: Secondary | ICD-10-CM | POA: Insufficient documentation

## 2019-06-22 ENCOUNTER — Ambulatory Visit
Admission: RE | Admit: 2019-06-22 | Discharge: 2019-06-22 | Disposition: A | Discharge status: Home / Self Care | Payer: Medicare Other | Source: Ambulatory Visit | Attending: Family Medicine | Admitting: Family Medicine

## 2019-06-22 ENCOUNTER — Other Ambulatory Visit: Payer: Self-pay

## 2019-06-22 DIAGNOSIS — M8580 Other specified disorders of bone density and structure, unspecified site: Secondary | ICD-10-CM

## 2019-06-26 ENCOUNTER — Telehealth: Payer: Self-pay | Admitting: *Deleted

## 2019-06-26 NOTE — Telephone Encounter (Signed)
Patient also stated when I talked to her today that she does walk and that the toenail does not hurt, just looks bad and I stated to call if there is any signs of infection or just doesn't feel right. Lattie Haw

## 2019-06-26 NOTE — Progress Notes (Signed)
Called and spoke with the patient and relayed the message Per Dr Jacqualyn Posey. Lattie Haw

## 2019-07-07 ENCOUNTER — Other Ambulatory Visit: Payer: Self-pay

## 2019-07-07 ENCOUNTER — Encounter: Payer: Self-pay | Admitting: Podiatry

## 2019-07-07 ENCOUNTER — Ambulatory Visit: Payer: Medicare Other | Admitting: Podiatry

## 2019-07-07 VITALS — Temp 97.4°F

## 2019-07-07 DIAGNOSIS — L601 Onycholysis: Secondary | ICD-10-CM | POA: Diagnosis not present

## 2019-07-07 DIAGNOSIS — B351 Tinea unguium: Secondary | ICD-10-CM

## 2019-07-07 NOTE — Progress Notes (Signed)
Subjective: 84 year old female presents the office today for evaluation of right big toenail thickening.  She had a vascular studies performed.  She is now toenails no longer hurting and she denied any drainage or pus coming from the area and she is having no issues.Denies any systemic complaints such as fevers, chills, nausea, vomiting. No acute changes since last appointment, and no other complaints at this time.   Objective: AAO x3, NAD Vascular exam unchanged.  The right hallux is hypertrophic, dystrophic with yellow-brown discoloration.  In general her nails on the same thickening discoloration but there is no pain of the nail today there is no surrounding redness or drainage or any signs of infection.  No open lesions. No pain with calf compression, swelling, warmth, erythema  Assessment: Onychodystrophy  Plan: -All treatment options discussed with the patient including all alternatives, risks, complications.  -Reviewed the arterial studies with her.  She also hold off on nail removal today.  As a courtesy I debrided the nails without any complications or bleeding.  We will see her back in 3 months for routine care if needed or sooner if any issues are checked. -Patient encouraged to call the office with any questions, concerns, change in symptoms.   Trula Slade DPM

## 2019-07-10 ENCOUNTER — Other Ambulatory Visit: Payer: Self-pay

## 2019-07-10 ENCOUNTER — Encounter (HOSPITAL_BASED_OUTPATIENT_CLINIC_OR_DEPARTMENT_OTHER): Payer: Self-pay | Admitting: *Deleted

## 2019-07-14 ENCOUNTER — Other Ambulatory Visit (HOSPITAL_COMMUNITY)
Admission: RE | Admit: 2019-07-14 | Discharge: 2019-07-14 | Disposition: A | Discharge status: Home / Self Care | Payer: Medicare Other | Source: Ambulatory Visit | Attending: Surgery | Admitting: Surgery

## 2019-07-14 DIAGNOSIS — Z20828 Contact with and (suspected) exposure to other viral communicable diseases: Secondary | ICD-10-CM | POA: Diagnosis present

## 2019-07-14 LAB — SARS CORONAVIRUS 2 (TAT 6-24 HRS): SARS Coronavirus 2: NEGATIVE

## 2019-07-15 ENCOUNTER — Other Ambulatory Visit: Payer: Self-pay

## 2019-07-15 ENCOUNTER — Encounter (HOSPITAL_BASED_OUTPATIENT_CLINIC_OR_DEPARTMENT_OTHER)
Admission: RE | Admit: 2019-07-15 | Discharge: 2019-07-15 | Disposition: A | Discharge status: Home / Self Care | Payer: Medicare Other | Source: Ambulatory Visit | Attending: Surgery | Admitting: Surgery

## 2019-07-15 ENCOUNTER — Ambulatory Visit: Payer: Self-pay | Admitting: Surgery

## 2019-07-15 DIAGNOSIS — D0512 Intraductal carcinoma in situ of left breast: Secondary | ICD-10-CM | POA: Diagnosis not present

## 2019-07-15 DIAGNOSIS — Z01818 Encounter for other preprocedural examination: Secondary | ICD-10-CM | POA: Insufficient documentation

## 2019-07-15 DIAGNOSIS — I471 Supraventricular tachycardia: Secondary | ICD-10-CM | POA: Insufficient documentation

## 2019-07-15 DIAGNOSIS — I10 Essential (primary) hypertension: Secondary | ICD-10-CM | POA: Diagnosis not present

## 2019-07-15 DIAGNOSIS — Z9011 Acquired absence of right breast and nipple: Secondary | ICD-10-CM | POA: Diagnosis not present

## 2019-07-15 DIAGNOSIS — Z79899 Other long term (current) drug therapy: Secondary | ICD-10-CM | POA: Diagnosis not present

## 2019-07-15 DIAGNOSIS — Z7984 Long term (current) use of oral hypoglycemic drugs: Secondary | ICD-10-CM | POA: Diagnosis not present

## 2019-07-15 DIAGNOSIS — N6489 Other specified disorders of breast: Secondary | ICD-10-CM | POA: Diagnosis not present

## 2019-07-15 DIAGNOSIS — E119 Type 2 diabetes mellitus without complications: Secondary | ICD-10-CM | POA: Diagnosis not present

## 2019-07-15 DIAGNOSIS — Z7982 Long term (current) use of aspirin: Secondary | ICD-10-CM | POA: Diagnosis not present

## 2019-07-15 DIAGNOSIS — R9431 Abnormal electrocardiogram [ECG] [EKG]: Secondary | ICD-10-CM | POA: Insufficient documentation

## 2019-07-15 DIAGNOSIS — M1990 Unspecified osteoarthritis, unspecified site: Secondary | ICD-10-CM | POA: Diagnosis not present

## 2019-07-15 DIAGNOSIS — Z170 Estrogen receptor positive status [ER+]: Secondary | ICD-10-CM | POA: Diagnosis not present

## 2019-07-15 LAB — BASIC METABOLIC PANEL
Anion gap: 9 (ref 5–15)
BUN: 12 mg/dL (ref 8–23)
CO2: 24 mmol/L (ref 22–32)
Calcium: 10.3 mg/dL (ref 8.9–10.3)
Chloride: 107 mmol/L (ref 98–111)
Creatinine, Ser: 0.83 mg/dL (ref 0.44–1.00)
GFR calc Af Amer: >60 mL/min (ref >60)
GFR calc non Af Amer: >60 mL/min (ref >60)
Glucose, Bld: 237 mg/dL — ABNORMAL HIGH (ref 70–99)
Potassium: 3.9 mmol/L (ref 3.5–5.1)
Sodium: 140 mmol/L (ref 135–145)

## 2019-07-15 NOTE — Progress Notes (Signed)
EKG completed, Heart rate 90-125, Dr. Fransisco Beau reviewed EKG, will proceed with surgery as scheduled. Pt denies chest pain or SOB.   Gatorade drink and surgical soap given with instructions, pt verbalized understanding.

## 2019-07-16 ENCOUNTER — Ambulatory Visit
Admission: RE | Admit: 2019-07-16 | Discharge: 2019-07-16 | Disposition: A | Discharge status: Home / Self Care | Payer: Medicare Other | Source: Ambulatory Visit | Attending: Surgery | Admitting: Surgery

## 2019-07-16 DIAGNOSIS — D0512 Intraductal carcinoma in situ of left breast: Secondary | ICD-10-CM

## 2019-07-16 NOTE — H&P (Signed)
  Forest Gleason Documented: 03/09/2019 2:08 PM Location: [facility] Patient #: [id number] DOB: [birth year] Married / Language: English / Race: Black or African American Female   History of Present Illness Rodman Key B. Hassell Done MD; 03/09/2019 3:00 PM) The patient is a 84 year old female who presents with breast cancer. The patient is being seen for a consultation for ductal carcinoma in situ. This involves the upper outer quadrant of the left breast. I had performed a right mastectomy ~1999. Today there is an area of induration corresponding to the site of the core biopsies that showed DCIS with 90% ER positivity. I have explained radioactive seed implantation and lumpectomy.   Will schecule at [facility]S under general.    Allergies ([place], CMA; 03/09/2019 2:08 PM) No Known Drug Allergies  [12/09/2014]: Allergies Reconciled   Medication History Nance Pew, CMA; 03/09/2019 2:09 PM) Illusions AA Breast Prosthesis (1 (one) Misc as needed, Taken starting 11/30/2014) Active. (C50.919 Right Mastectomy ; ; L8000- Post-Surgical Bras-6; ; T7017- Silicone Breast Prosthesis(Due to Weight Loss)) AmLODIPine Besylate (10MG  Tablet, Oral) Active. CloNIDine HCl (0.1MG  Tablet, Oral) Active. Dorzolamide HCl-Timolol Mal (22.3-6.8MG /ML Solution, Ophthalmic) Active. Gabapentin (400MG  Capsule, Oral) Active. Glimepiride (2MG  Tablet, Oral) Active. Hydrochlorothiazide (25MG  Tablet, Oral) Active. Ibuprofen (800MG  Tablet, Oral) Active. Latanoprost (0.005% Solution, Ophthalmic) Active. MetFORMIN HCl (500MG  Tablet, Oral) Active. OneTouch Verio (In Vitro) Active. Vaqta (50UNIT/ML Suspension, Intramuscular) Active. Illusions C Breast Prosthesis (1 (one)) Active. (B9390 Silicone Breast Prosthesis - to restore balance and symmetry after breast surgery; QTY: 1 for single; Length of use: 2 years No refills ; Replacement: Previous Past RUL; L8000 Post-surgical bras - to hold breast prosthesis ;  QTY: 6; Length of use:3 Months 3 Refills) DULoxetine HCl (60MG  Capsule DR Part, Oral) Active. Combigan (0.2-0.5% Solution, Ophthalmic) Active. Azopt (1% Suspension, Ophthalmic) Active. Lumigan (0.01% Solution, Ophthalmic) Active. Atorvastatin Calcium (40MG  Tablet, Oral) Active. Aspirin (81MG  Tablet, Oral) Active. Caltrate 600 + D (600-125MG -IU Tablet, Oral) Active. Medications Reconciled  Vitals (Sabrina Canty CMA; 03/09/2019 2:09 PM) 03/09/2019 2:09 PM Weight: 146.13 lb Height: 61in Body Surface Area: 1.65 m Body Mass Index: 27.61 kg/m  Temp.: 97.59F (Temporal)  Pulse: 98 (Regular)  P.OX: 96% (Room air) BP: 146/68(Sitting, Left Arm, Standard)       Physical Exam (Diondre Pulis B. Hassell Done MD; 03/09/2019 3:01 PM) The physical exam findings are as follows: Note: HEENT unremarkable Neck supple Chest clear Heart SR Breast; right is surgically absent; left is large with residual hematoma at bx site.    Assessment & Plan Rodman Key B. Hassell Done MD; 03/09/2019 3:02 PM)  DUCTAL CARCINOMA IN SITU (DCIS) OF LEFT BREAST (D05.12) Impression: Radioactive seed placement on the left with lumpectomy

## 2019-07-17 ENCOUNTER — Encounter (HOSPITAL_BASED_OUTPATIENT_CLINIC_OR_DEPARTMENT_OTHER)
Admission: RE | Disposition: A | Discharge status: Home / Self Care | Payer: Self-pay | Source: Home / Self Care | Attending: Surgery

## 2019-07-17 ENCOUNTER — Other Ambulatory Visit: Payer: Self-pay

## 2019-07-17 ENCOUNTER — Encounter (HOSPITAL_BASED_OUTPATIENT_CLINIC_OR_DEPARTMENT_OTHER): Payer: Self-pay | Admitting: *Deleted

## 2019-07-17 ENCOUNTER — Ambulatory Visit
Admission: RE | Admit: 2019-07-17 | Discharge: 2019-07-17 | Disposition: A | Discharge status: Home / Self Care | Payer: Medicare Other | Source: Ambulatory Visit | Attending: Surgery | Admitting: Surgery

## 2019-07-17 ENCOUNTER — Ambulatory Visit (HOSPITAL_BASED_OUTPATIENT_CLINIC_OR_DEPARTMENT_OTHER): Payer: Medicare Other | Admitting: Anesthesiology

## 2019-07-17 ENCOUNTER — Ambulatory Visit (HOSPITAL_BASED_OUTPATIENT_CLINIC_OR_DEPARTMENT_OTHER)
Admission: RE | Admit: 2019-07-17 | Discharge: 2019-07-17 | Disposition: A | Discharge status: Home / Self Care | Payer: Medicare Other | Attending: Surgery | Admitting: Surgery

## 2019-07-17 DIAGNOSIS — I10 Essential (primary) hypertension: Secondary | ICD-10-CM | POA: Insufficient documentation

## 2019-07-17 DIAGNOSIS — Z9011 Acquired absence of right breast and nipple: Secondary | ICD-10-CM | POA: Insufficient documentation

## 2019-07-17 DIAGNOSIS — D0512 Intraductal carcinoma in situ of left breast: Secondary | ICD-10-CM | POA: Insufficient documentation

## 2019-07-17 DIAGNOSIS — Z170 Estrogen receptor positive status [ER+]: Secondary | ICD-10-CM | POA: Diagnosis not present

## 2019-07-17 DIAGNOSIS — Z7984 Long term (current) use of oral hypoglycemic drugs: Secondary | ICD-10-CM | POA: Insufficient documentation

## 2019-07-17 DIAGNOSIS — N6489 Other specified disorders of breast: Secondary | ICD-10-CM | POA: Insufficient documentation

## 2019-07-17 DIAGNOSIS — Z7982 Long term (current) use of aspirin: Secondary | ICD-10-CM | POA: Insufficient documentation

## 2019-07-17 DIAGNOSIS — M1990 Unspecified osteoarthritis, unspecified site: Secondary | ICD-10-CM | POA: Insufficient documentation

## 2019-07-17 DIAGNOSIS — Z79899 Other long term (current) drug therapy: Secondary | ICD-10-CM | POA: Insufficient documentation

## 2019-07-17 DIAGNOSIS — E119 Type 2 diabetes mellitus without complications: Secondary | ICD-10-CM | POA: Insufficient documentation

## 2019-07-17 HISTORY — PX: BREAST LUMPECTOMY WITH RADIOACTIVE SEED LOCALIZATION: SHX6424

## 2019-07-17 LAB — GLUCOSE, CAPILLARY
Glucose-Capillary: 88 mg/dL (ref 70–99)
Glucose-Capillary: 88 mg/dL (ref 70–99)

## 2019-07-17 SURGERY — BREAST LUMPECTOMY WITH RADIOACTIVE SEED LOCALIZATION
Anesthesia: General | Site: Breast | Laterality: Left

## 2019-07-17 MED ORDER — ACETAMINOPHEN 500 MG PO TABS
ORAL_TABLET | ORAL | Status: AC
  Filled 2019-07-17: qty 2

## 2019-07-17 MED ORDER — DEXTROSE 5 % IV SOLN
3.0000 g | INTRAVENOUS | Status: AC
  Administered 2019-07-17: 2 g via INTRAVENOUS

## 2019-07-17 MED ORDER — HEPARIN SODIUM (PORCINE) 5000 UNIT/ML IJ SOLN: 5000.0000 [IU] | Freq: Once | INTRAMUSCULAR | Status: DC

## 2019-07-17 MED ORDER — ACETAMINOPHEN 325 MG PO TABS: 325.0000 mg | ORAL_TABLET | ORAL | Status: DC | PRN

## 2019-07-17 MED ORDER — PHENYLEPHRINE 40 MCG/ML (10ML) SYRINGE FOR IV PUSH (FOR BLOOD PRESSURE SUPPORT)
PREFILLED_SYRINGE | INTRAVENOUS | Status: AC
  Filled 2019-07-17: qty 10

## 2019-07-17 MED ORDER — ONDANSETRON HCL 4 MG/2ML IJ SOLN: 4.0000 mg | Freq: Once | INTRAMUSCULAR | Status: DC | PRN

## 2019-07-17 MED ORDER — FENTANYL CITRATE (PF) 100 MCG/2ML IJ SOLN
INTRAMUSCULAR | Status: AC
  Filled 2019-07-17: qty 2

## 2019-07-17 MED ORDER — MIDAZOLAM HCL 2 MG/2ML IJ SOLN: 1.0000 mg | INTRAMUSCULAR | Status: DC | PRN

## 2019-07-17 MED ORDER — CHLORHEXIDINE GLUCONATE CLOTH 2 % EX PADS: 6.0000 | MEDICATED_PAD | Freq: Once | CUTANEOUS | Status: DC

## 2019-07-17 MED ORDER — HEPARIN SODIUM (PORCINE) 5000 UNIT/ML IJ SOLN
INTRAMUSCULAR | Status: AC
  Filled 2019-07-17: qty 1

## 2019-07-17 MED ORDER — DEXAMETHASONE SODIUM PHOSPHATE 4 MG/ML IJ SOLN
INTRAMUSCULAR | Status: DC | PRN
  Administered 2019-07-17: 6 mg via INTRAVENOUS

## 2019-07-17 MED ORDER — PROPOFOL 10 MG/ML IV BOLUS
INTRAVENOUS | Status: DC | PRN
  Administered 2019-07-17: 150 mg via INTRAVENOUS

## 2019-07-17 MED ORDER — BUPIVACAINE LIPOSOME 1.3 % IJ SUSP: 20.0000 mL | Freq: Once | INTRAMUSCULAR | Status: DC

## 2019-07-17 MED ORDER — BUPIVACAINE LIPOSOME 1.3 % IJ SUSP
INTRAMUSCULAR | Status: AC
  Filled 2019-07-17: qty 20

## 2019-07-17 MED ORDER — EPHEDRINE SULFATE 50 MG/ML IJ SOLN
INTRAMUSCULAR | Status: DC | PRN
  Administered 2019-07-17: 10 mg via INTRAVENOUS

## 2019-07-17 MED ORDER — CEFAZOLIN SODIUM-DEXTROSE 2-4 GM/100ML-% IV SOLN
INTRAVENOUS | Status: AC
  Filled 2019-07-17: qty 100

## 2019-07-17 MED ORDER — ACETAMINOPHEN 160 MG/5ML PO SOLN: 325.0000 mg | ORAL | Status: DC | PRN

## 2019-07-17 MED ORDER — SCOPOLAMINE 1 MG/3DAYS TD PT72: 1.0000 | MEDICATED_PATCH | Freq: Once | TRANSDERMAL | Status: DC | PRN

## 2019-07-17 MED ORDER — HYDROCODONE-ACETAMINOPHEN 5-325 MG PO TABS: 1.0000 | ORAL_TABLET | Freq: Four times a day (QID) | ORAL | 0 refills | Status: DC | PRN

## 2019-07-17 MED ORDER — ONDANSETRON HCL 4 MG/2ML IJ SOLN
INTRAMUSCULAR | Status: DC | PRN
  Administered 2019-07-17: 4 mg via INTRAVENOUS

## 2019-07-17 MED ORDER — OXYCODONE HCL 5 MG PO TABS: 5.0000 mg | ORAL_TABLET | Freq: Once | ORAL | Status: DC | PRN

## 2019-07-17 MED ORDER — BUPIVACAINE LIPOSOME 1.3 % IJ SUSP
INTRAMUSCULAR | Status: DC | PRN
  Administered 2019-07-17: 19 mL

## 2019-07-17 MED ORDER — OXYCODONE HCL 5 MG/5ML PO SOLN: 5.0000 mg | Freq: Once | ORAL | Status: DC | PRN

## 2019-07-17 MED ORDER — LACTATED RINGERS IV SOLN
INTRAVENOUS | Status: DC
  Administered 2019-07-17 (×2): via INTRAVENOUS

## 2019-07-17 MED ORDER — PHENYLEPHRINE 40 MCG/ML (10ML) SYRINGE FOR IV PUSH (FOR BLOOD PRESSURE SUPPORT)
PREFILLED_SYRINGE | INTRAVENOUS | Status: DC | PRN
  Administered 2019-07-17: 120 ug via INTRAVENOUS
  Administered 2019-07-17 (×7): 80 ug via INTRAVENOUS

## 2019-07-17 MED ORDER — FENTANYL CITRATE (PF) 100 MCG/2ML IJ SOLN: 25.0000 ug | INTRAMUSCULAR | Status: DC | PRN

## 2019-07-17 MED ORDER — ACETAMINOPHEN 500 MG PO TABS
1000.0000 mg | ORAL_TABLET | ORAL | Status: AC
  Administered 2019-07-17: 1000 mg via ORAL

## 2019-07-17 MED ORDER — FENTANYL CITRATE (PF) 100 MCG/2ML IJ SOLN
50.0000 ug | INTRAMUSCULAR | Status: DC | PRN
  Administered 2019-07-17 (×2): 50 ug via INTRAVENOUS

## 2019-07-17 MED ORDER — MEPERIDINE HCL 25 MG/ML IJ SOLN: 6.2500 mg | INTRAMUSCULAR | Status: DC | PRN

## 2019-07-17 MED ORDER — LIDOCAINE 2% (20 MG/ML) 5 ML SYRINGE
INTRAMUSCULAR | Status: DC | PRN
  Administered 2019-07-17: 60 mg via INTRAVENOUS

## 2019-07-17 MED ORDER — ONDANSETRON HCL 4 MG/2ML IJ SOLN
INTRAMUSCULAR | Status: AC
  Filled 2019-07-17: qty 2

## 2019-07-17 SURGICAL SUPPLY — 54 items
ADH SKN CLS APL DERMABOND .7 (GAUZE/BANDAGES/DRESSINGS) ×1
APL SKNCLS STERI-STRIP NONHPOA (GAUZE/BANDAGES/DRESSINGS)
APPLIER CLIP 9.375 MED OPEN (MISCELLANEOUS)
APR CLP MED 9.3 20 MLT OPN (MISCELLANEOUS)
BENZOIN TINCTURE PRP APPL 2/3 (GAUZE/BANDAGES/DRESSINGS) IMPLANT
BINDER BREAST LRG (GAUZE/BANDAGES/DRESSINGS) IMPLANT
BINDER BREAST MEDIUM (GAUZE/BANDAGES/DRESSINGS) IMPLANT
BINDER BREAST XLRG (GAUZE/BANDAGES/DRESSINGS) ×1 IMPLANT
BINDER BREAST XXLRG (GAUZE/BANDAGES/DRESSINGS) IMPLANT
BLADE HEX COATED 2.75 (ELECTRODE) IMPLANT
BLADE SURG 10 STRL SS (BLADE) IMPLANT
BLADE SURG 15 STRL LF DISP TIS (BLADE) ×1 IMPLANT
BLADE SURG 15 STRL SS (BLADE) ×2
CANISTER SUC SOCK COL 7IN (MISCELLANEOUS) IMPLANT
CANISTER SUCT 1200ML W/VALVE (MISCELLANEOUS) ×2 IMPLANT
CLIP APPLIE 9.375 MED OPEN (MISCELLANEOUS) IMPLANT
CLIP VESOCCLUDE SM WIDE 6/CT (CLIP) IMPLANT
COVER BACK TABLE REUSABLE LG (DRAPES) ×2 IMPLANT
COVER MAYO STAND REUSABLE (DRAPES) ×2 IMPLANT
COVER PROBE W GEL 5X96 (DRAPES) ×2 IMPLANT
COVER WAND RF STERILE (DRAPES) IMPLANT
DECANTER SPIKE VIAL GLASS SM (MISCELLANEOUS) IMPLANT
DERMABOND ADVANCED (GAUZE/BANDAGES/DRESSINGS) ×1
DERMABOND ADVANCED .7 DNX12 (GAUZE/BANDAGES/DRESSINGS) ×1 IMPLANT
DRAPE HALF SHEET 70X43 (DRAPES) ×2 IMPLANT
DRAPE LAPAROTOMY 100X72 PEDS (DRAPES) ×2 IMPLANT
DRSG PAD ABDOMINAL 8X10 ST (GAUZE/BANDAGES/DRESSINGS) IMPLANT
ELECT COATED BLADE 2.86 ST (ELECTRODE) ×2 IMPLANT
ELECT REM PT RETURN 9FT ADLT (ELECTROSURGICAL) ×2
ELECTRODE REM PT RTRN 9FT ADLT (ELECTROSURGICAL) ×1 IMPLANT
GAUZE SPONGE 4X4 12PLY STRL LF (GAUZE/BANDAGES/DRESSINGS) IMPLANT
GLOVE BIO SURGEON STRL SZ8 (GLOVE) ×2 IMPLANT
GOWN STRL REUS W/ TWL LRG LVL3 (GOWN DISPOSABLE) ×1 IMPLANT
GOWN STRL REUS W/ TWL XL LVL3 (GOWN DISPOSABLE) ×1 IMPLANT
GOWN STRL REUS W/TWL LRG LVL3 (GOWN DISPOSABLE) ×2
GOWN STRL REUS W/TWL XL LVL3 (GOWN DISPOSABLE) ×2
KIT MARKER MARGIN INK (KITS) ×2 IMPLANT
NDL HYPO 25X1 1.5 SAFETY (NEEDLE) ×1 IMPLANT
NEEDLE HYPO 25X1 1.5 SAFETY (NEEDLE) ×2 IMPLANT
NS IRRIG 1000ML POUR BTL (IV SOLUTION) ×1 IMPLANT
PACK BASIN DAY SURGERY FS (CUSTOM PROCEDURE TRAY) ×2 IMPLANT
PENCIL BUTTON HOLSTER BLD 10FT (ELECTRODE) ×2 IMPLANT
SCRUB TECHNI CARE 4 OZ NO DYE (MISCELLANEOUS) ×2 IMPLANT
SLEEVE SCD COMPRESS KNEE MED (MISCELLANEOUS) ×2 IMPLANT
SPONGE LAP 18X18 RF (DISPOSABLE) ×2 IMPLANT
STRIP CLOSURE SKIN 1/4X4 (GAUZE/BANDAGES/DRESSINGS) IMPLANT
SUT MNCRL AB 4-0 PS2 18 (SUTURE) ×1 IMPLANT
SUT VICRYL 3-0 CR8 SH (SUTURE) ×2 IMPLANT
SUT VICRYL RAPIDE 4/0 PS 2 (SUTURE) IMPLANT
SYR CONTROL 10ML LL (SYRINGE) ×2 IMPLANT
TOWEL GREEN STERILE FF (TOWEL DISPOSABLE) ×2 IMPLANT
TRAY FAXITRON CT DISP (TRAY / TRAY PROCEDURE) ×2 IMPLANT
TUBE CONNECTING 20X1/4 (TUBING) ×2 IMPLANT
YANKAUER SUCT BULB TIP NO VENT (SUCTIONS) ×2 IMPLANT

## 2019-07-17 NOTE — Anesthesia Postprocedure Evaluation (Signed)
Anesthesia Post Note  Patient: Amanda Green  Procedure(s) Performed: LEFT BREAST LUMPECTOMY WITH RADIOACTIVE SEED LOCALIZATION (Left Breast)     Patient location during evaluation: Phase II Anesthesia Type: General Level of consciousness: awake Pain management: pain level controlled Vital Signs Assessment: post-procedure vital signs reviewed and stable Respiratory status: spontaneous breathing Cardiovascular status: stable Postop Assessment: no apparent nausea or vomiting Anesthetic complications: no    Last Vitals:  Vitals:   07/17/19 1245 07/17/19 1300  BP: (!) 116/58 124/63  Pulse: 80 79  Resp: 18 (!) 23  Temp:    SpO2: 100% 97%    Last Pain:  Vitals:   07/17/19 1300  TempSrc:   PainSc: 0-No pain   Pain Goal:                   Huston Foley

## 2019-07-17 NOTE — Anesthesia Preprocedure Evaluation (Signed)
Anesthesia Evaluation  Patient identified by MRN, date of birth, ID band Patient awake    Reviewed: Allergy & Precautions, NPO status , Patient's Chart, lab work & pertinent test results  History of Anesthesia Complications (+) DIFFICULT AIRWAY and history of anesthetic complications  Airway Mallampati: I  TM Distance: >3 FB Neck ROM: Full    Dental  (+) Teeth Intact   Pulmonary neg pulmonary ROS,    Pulmonary exam normal breath sounds clear to auscultation       Cardiovascular hypertension, Pt. on medications Normal cardiovascular exam Rhythm:Regular Rate:Normal     Neuro/Psych CVA negative psych ROS   GI/Hepatic negative GI ROS, Neg liver ROS,   Endo/Other  diabetes, Type 2, Oral Hypoglycemic Agents  Renal/GU negative Renal ROS  negative genitourinary   Musculoskeletal  (+) Arthritis , Osteoarthritis,    Abdominal Normal abdominal exam  (+)   Peds negative pediatric ROS (+)  Hematology negative hematology ROS (+)   Anesthesia Other Findings   Reproductive/Obstetrics negative OB ROS                             Lab Results  Component Value Date   WBC 6.6 08/13/2016   HGB 12.3 08/13/2016   HCT 37.0 08/13/2016   MCV 85.8 08/13/2016   PLT 332 08/13/2016   Lab Results  Component Value Date   CREATININE 0.83 07/15/2019   BUN 12 07/15/2019   NA 140 07/15/2019   K 3.9 07/15/2019   CL 107 07/15/2019   CO2 24 07/15/2019   Lab Results  Component Value Date   INR 1.00 03/23/2010   07/2016 EKG: normal sinus rhythm, occasional PVC noted, unifocal.  Anesthesia Physical  Anesthesia Plan  ASA: II  Anesthesia Plan: General   Post-op Pain Management:    Induction: Intravenous  PONV Risk Score and Plan: Treatment may vary due to age or medical condition  Airway Management Planned: LMA  Additional Equipment:   Intra-op Plan:   Post-operative Plan: Extubation in  OR  Informed Consent: I have reviewed the patients History and Physical, chart, labs and discussed the procedure including the risks, benefits and alternatives for the proposed anesthesia with the patient or authorized representative who has indicated his/her understanding and acceptance.     Dental advisory given  Plan Discussed with: CRNA  Anesthesia Plan Comments:         Anesthesia Quick Evaluation

## 2019-07-17 NOTE — Transfer of Care (Signed)
Immediate Anesthesia Transfer of Care Note  Patient: Amanda Green  Procedure(s) Performed: LEFT BREAST LUMPECTOMY WITH RADIOACTIVE SEED LOCALIZATION (Left Breast)  Patient Location: PACU  Anesthesia Type:General  Level of Consciousness: drowsy  Airway & Oxygen Therapy: Patient Spontanous Breathing and Patient connected to face mask oxygen  Post-op Assessment: Report given to RN and Post -op Vital signs reviewed and stable  Post vital signs: Reviewed and stable  Last Vitals:  Vitals Value Taken Time  BP    Temp    Pulse 92 07/17/19 1222  Resp 26 07/17/19 1222  SpO2 100 % 07/17/19 1222    Last Pain:  Vitals:   07/17/19 0952  TempSrc: Oral  PainSc: 0-No pain         Complications: No apparent anesthesia complications

## 2019-07-17 NOTE — Anesthesia Procedure Notes (Signed)
Procedure Name: LMA Insertion Date/Time: 07/17/2019 10:59 AM Performed by: Lieutenant Diego, CRNA Pre-anesthesia Checklist: Patient identified, Emergency Drugs available, Suction available and Patient being monitored Patient Re-evaluated:Patient Re-evaluated prior to induction Oxygen Delivery Method: Circle system utilized Preoxygenation: Pre-oxygenation with 100% oxygen Induction Type: IV induction Ventilation: Mask ventilation without difficulty LMA: LMA inserted LMA Size: 4.0 Number of attempts: 1 Placement Confirmation: positive ETCO2 and breath sounds checked- equal and bilateral Tube secured with: Tape Dental Injury: Teeth and Oropharynx as per pre-operative assessment

## 2019-07-17 NOTE — Interval H&P Note (Signed)
History and Physical Interval Note:  07/17/2019 10:27 AM  Amanda Green  has presented today for surgery, with the diagnosis of LEFT BREAST DCIS.  The various methods of treatment have been discussed with the patient and family. After consideration of risks, benefits and other options for treatment, the patient has consented to  Procedure(s): LEFT BREAST LUMPECTOMY WITH RADIOACTIVE SEED LOCALIZATION (Left) as a surgical intervention.  The patient's history has been reviewed, patient examined, no change in status, stable for surgery.  I have reviewed the patient's chart and labs.  Questions were answered to the patient's satisfaction.     Pedro Earls

## 2019-07-17 NOTE — Op Note (Signed)
Arlet Marter  [birth year] 17 July 2019    PCP:  Alroy Dust, L.[name], MD   Surgeon: Kaylyn Lim, MD, FACS  Asst:  none  Anes:  General LMA  Preop Dx: DCIS in left breast; right mastectomy 20 years ago Postop Dx: Same-path pending  Procedure: Radioactive seed localization of left breast biopsy site and lumpectomy Location Surgery: CDS 8 Complications: None noted  EBL:   minimal cc  Drains: none  Description of Procedure:  The patient was taken to OR 8 .  After anesthesia was administered and the patient was prepped  with Technicare and a timeout was performed.  The hot area in the breast was mapped and a curvilinear incision was described to get more inferiorly on the specimen.  The Bovie was used to performed the Neoprobe oriented dissection.  When completed, the lumpectomy was marked with color and the cavity marked with clips.  The wound was irrigated and no bleeding noted.  The area was infiltrated with Exparel.  The wound was closed in layers with 4-0 vicryl and 4-0 monocryl subcuticularly and Dermabond.    The patient tolerated the procedure well and was taken to the PACU in stable condition.     Matt B. Hassell Done, [place], [facility], [place]

## 2019-07-17 NOTE — Discharge Instructions (Signed)
No Tylenol before 4:00pm   Post Anesthesia Home Care Instructions  Activity: Get plenty of rest for the remainder of the day. A responsible individual must stay with you for 24 hours following the procedure.  For the next 24 hours, DO NOT: -Drive a car -Paediatric nurse -Drink alcoholic beverages -Take any medication unless instructed by your physician -Make any legal decisions or sign important papers.  Meals: Start with liquid foods such as gelatin or soup. Progress to regular foods as tolerated. Avoid greasy, spicy, heavy foods. If nausea and/or vomiting occur, drink only clear liquids until the nausea and/or vomiting subsides. Call your physician if vomiting continues.  Special Instructions/Symptoms: Your throat may feel dry or sore from the anesthesia or the breathing tube placed in your throat during surgery. If this causes discomfort, gargle with warm salt water. The discomfort should disappear within 24 hours.  If you had a scopolamine patch placed behind your ear for the management of post- operative nausea and/or vomiting:  1. The medication in the patch is effective for 72 hours, after which it should be removed.  Wrap patch in a tissue and discard in the trash. Wash hands thoroughly with soap and water. 2. You may remove the patch earlier than 72 hours if you experience unpleasant side effects which may include dry mouth, dizziness or visual disturbances. 3. Avoid touching the patch. Wash your hands with soap and water after contact with the patch.     Information for Discharge Teaching: EXPAREL (bupivacaine liposome injectable suspension)   Your surgeon or anesthesiologist gave you EXPAREL(bupivacaine) to help control your pain after surgery.   EXPAREL is a local anesthetic that provides pain relief by numbing the tissue around the surgical site.  EXPAREL is designed to release pain medication over time and can control pain for up to 72 hours.  Depending on how you  respond to EXPAREL, you may require less pain medication during your recovery.  Possible side effects:  Temporary loss of sensation or ability to move in the area where bupivacaine was injected.  Nausea, vomiting, constipation  Rarely, numbness and tingling in your mouth or lips, lightheadedness, or anxiety may occur.  Call your doctor right away if you think you may be experiencing any of these sensations, or if you have other questions regarding possible side effects.  Follow all other discharge instructions given to you by your surgeon or nurse. Eat a healthy diet and drink plenty of water or other fluids.  If you return to the hospital for any reason within 96 hours following the administration of EXPAREL, it is important for health care providers to know that you have received this anesthetic. A teal colored band has been placed on your arm with the date, time and amount of EXPAREL you have received in order to alert and inform your health care providers. Please leave this armband in place for the full 96 hours following administration, and then you may remove the band.

## 2019-07-20 ENCOUNTER — Encounter (HOSPITAL_BASED_OUTPATIENT_CLINIC_OR_DEPARTMENT_OTHER): Payer: Self-pay | Admitting: Surgery

## 2019-07-21 ENCOUNTER — Encounter: Payer: Self-pay | Admitting: *Deleted

## 2019-09-11 ENCOUNTER — Telehealth: Payer: Self-pay | Admitting: Oncology

## 2019-09-11 NOTE — Telephone Encounter (Signed)
Called patient regarding upcoming Web[facility] appointment, patient prefers this to be a walk-in visit due to having other appointments the same day.

## 2019-09-13 NOTE — Progress Notes (Signed)
[place]  [phone] [phone]     ID: Amanda Green DOB: 1935/01/24  MR#: [id number]  [id number]  Patient Care Team: Alroy Dust, Carlean Jews.[name], MD as PCP - General (Family Medicine) Johnathan Hausen, MD as Consulting Physician (General Surgery) Magrinat, Virgie Dad, MD as Consulting Physician (Oncology) Gery Pray, MD as Consulting Physician (Radiation Oncology) Chauncey Cruel, MD OTHER MD:  CHIEF COMPLAINT: Estrogen receptor positive ductal carcinoma in situ  CURRENT TREATMENT: Tamoxifen   HISTORY OF CURRENT ILLNESS: From the original intake note:  Amanda Green had routine left screening mammography on 02/24/2019 showing a possible abnormality. Of note, she has a history of right mastectomy in 1999 under Dr. Hassell Done.   She underwent left diagnostic mammography at [facility] on 03/04/2019 showing: breast density category B; indeterminate calcifications in the upper-outer left breast measuring 2.9 cm.  Accordingly on 03/04/2019 she proceeded to biopsy of the left breast area in question. The pathology from this procedure [id number]) showed: ductal carcinoma in situ with calcifications, grade 3. Prognostic indicators significant for: estrogen receptor, 90% positive with strong staining intensity and progesterone receptor, 0% negative.  She was started on tamoxifen shortly after by Dr. Hassell Done due to surgery delay for pandemic concerns.  She is scheduled for definitive left breast surgery 07/17/2019  The patient's subsequent history is as detailed below.   INTERVAL HISTORY: [name] returns today for follow-up and treatment of her estrogen receptor positive ductal carcinoma in situ. She was last seen here on 06/10/2019.  Today she is accompanied by her daughter [name].  She continues on tamoxifen.  She tolerates this with no significant side effects and in particular hot flashes and vaginal wetness are not a concern.  She is feeling  a little more than I am used to, she thinks she is paying $20 for 1 month supply  Since her last visit here she underwent definitive surgery, on 07/17/2019, with a left lumpectomy showing high-grade ductal carcinoma in situ measuring 2.7 cm, with negative although close margins.   REVIEW OF SYSTEMS: [place] tolerated her surgery well, with no unusual pain, fever, bleeding, or other complications.  She tells me she walks about half a mile most days, with 1 of her daughters, although not on weekends.  She is going to church and to the grocery store but otherwise stays pretty much in the house.  A detailed review of systems was otherwise noncontributory   PAST MEDICAL HISTORY: Past Medical History:  Diagnosis Date  . Arthritis   . Breast cancer, left ([place]) 05/2019   breast- right, mastectomy, 1999  . Diabetes mellitus   . Dislocation of left shoulder joint   . Hyperlipidemia   . Hypertension   . Numbness and tingling of right lower extremity   . Stroke ([place])    light stroke - 2012 ,right leg nerve damage     PAST SURGICAL HISTORY: Past Surgical History:  Procedure Laterality Date  . ABDOMINAL HYSTERECTOMY    . APPENDECTOMY    . BREAST BIOPSY Left 01/19/2010  . BREAST LUMPECTOMY WITH RADIOACTIVE SEED LOCALIZATION Left 07/17/2019   Procedure: LEFT BREAST LUMPECTOMY WITH RADIOACTIVE SEED LOCALIZATION;  Surgeon: Johnathan [place]en, MD;  Location: [place];  Service: General;  Laterality: Left;  . BREAST SURGERY     Right mastectomy 1999  . CARPAL TUNNEL RELEASE Right 09/30/2014   Procedure: RIGHT CARPAL TUNNEL RELEASE;  Surgeon: Daryll Brod, MD;  Location: [place];  Service: Orthopedics;  Laterality: Right;  .  COLONOSCOPY    . EYE SURGERY     cataracts  . JOINT REPLACEMENT     lt total knee 2011  . LIPOMA EXCISION  11/28/2011   Procedure: EXCISION LIPOMA;  Surgeon: Pedro Earls, MD;  Location: [place];  Service: General;  Laterality:  N/A;  excisino lipoma 4 cm back of neck  . MASTECTOMY Right 1999    FAMILY HISTORY: Family History  Problem Relation Age of Onset  . Esophageal cancer Mother   . Other Father        Unsure of medical history.  . Prostate cancer Brother   Patient's father died from lung cancer. Patient's mother died from esophageal cancer. The patient denies a family hx of breast or ovarian cancer. She has 1 brother. He was diagnosed with prostate cancer.   GYNECOLOGIC HISTORY:  No LMP recorded. Patient is postmenopausal. Menarche: 84 years old Age at first live birth: 84 years old Muse P 5 LMP age 84 Hysterectomy? Yes, age 40, uterus only BSO? No Hormone replacement?   SOCIAL HISTORY: (updated 06/10/2019)  [place] is originally from [employer].  She is currently retired. She is widowed. She lives at home with 1 of her granddaughteris, who is 51 and works at the post office.  Another of her granddaughters [name] works here in our center.    ADVANCED DIRECTIVES: not in place; she plans to name her daughter(Belinda as her healthcare power of attorney.  At the 09/14/2019 visit she was given the appropriate forms to complete and notarize at her discretion   HEALTH MAINTENANCE: Social History   Tobacco Use  . Smoking status: Never Smoker  . Smokeless tobacco: Never Used  Substance Use Topics  . Alcohol use: No  . Drug use: No     Colonoscopy: 03/2011, Dr. Penelope Coop, benign polyp  PAP: not on file, remote abdominal hysterectomy  Bone density: scheduled for 06/22/2019   No Known Allergies  Current Outpatient Medications  Medication Sig Dispense Refill  . amLODipine (NORVASC) 10 MG tablet Take 10 mg by mouth daily.     Marland Kitchen aspirin EC 81 MG tablet Take 81 mg by mouth daily.    Marland Kitchen atorvastatin (LIPITOR) 40 MG tablet Take 40 mg by mouth daily.     . bimatoprost (LUMIGAN) 0.01 % SOLN Place 1 drop into both eyes at bedtime.    . brimonidine-timolol (COMBIGAN) 0.2-0.5 % ophthalmic solution Place 1 drop into  both eyes every 12 (twelve) hours.    . brinzolamide (AZOPT) 1 % ophthalmic suspension 1 drop 3 (three) times daily.    . Calcium Carbonate-Vitamin D (CALTRATE 600+D PO) Take 1 tablet by mouth daily.     . cloNIDine (CATAPRES) 0.1 MG tablet Take 0.1 mg by mouth 2 (two) times daily.     Marland Kitchen gabapentin (NEURONTIN) 400 MG capsule Take 400 mg by mouth 3 (three) times daily.    Marland Kitchen glimepiride (AMARYL) 2 MG tablet Take 2 mg by mouth daily.     . hepatitis A vaccine (VAQTA) 50 UNIT/ML injection Inject 50 Units into the muscle once.    . hydrochlorothiazide (HYDRODIURIL) 25 MG tablet Take 25 mg by mouth daily.    Marland Kitchen HYDROcodone-acetaminophen (NORCO/VICODIN) 5-325 MG tablet Take 1 tablet by mouth every 6 (six) hours as needed for moderate pain. 15 tablet 0  . metFORMIN (GLUCOPHAGE) 500 MG tablet Take 1,000 mg by mouth 2 (two) times daily with a meal.     . Multiple Vitamins-Minerals (CENTRUM ADULTS PO) Take  1 tablet by mouth daily.      No current facility-administered medications for this visit.     OBJECTIVE: Older white woman who appears stated age  25:   09/14/19 1235  BP: (!) 145/75  Pulse: 86  Resp: 17  Temp: 98.5 F (36.9 C)  SpO2: 99%   Wt Readings from Last 3 Encounters:  09/14/19 146 lb 14.4 oz (66.6 kg)  07/17/19 142 lb 3.2 oz (64.5 kg)  01/28/17 149 lb (67.6 kg)   Body mass index is 27.76 kg/m.    ECOG FS:1 - Symptomatic but completely ambulatory  Ocular: Sclerae unicteric, pupils round and equal Ear-nose-throat: Wearing a mask Lymphatic: No cervical or supraclavicular adenopathy Lungs no rales or rhonchi Heart regular rate and rhythm Abd soft, nontender, positive bowel sounds MSK no focal spinal tenderness, no joint edema Neuro: non-focal, well-oriented, appropriate affect Breasts: Status post remote right mastectomy.  On the left she is status post lumpectomy, with good cosmetic results.  There is no evidence of disease activity.  Both axillae are benign.    LAB  RESULTS:  CMP     Component Value Date/Time   NA 140 07/15/2019 1200   K 3.9 07/15/2019 1200   CL 107 07/15/2019 1200   CO2 24 07/15/2019 1200   GLUCOSE 237 (H) 07/15/2019 1200   BUN 12 07/15/2019 1200   CREATININE 0.83 07/15/2019 1200   CALCIUM 10.3 07/15/2019 1200   PROT 6.9 03/23/2010 1155   ALBUMIN 4.1 03/23/2010 1155   AST 19 03/23/2010 1155   ALT 19 03/23/2010 1155   ALKPHOS 72 03/23/2010 1155   BILITOT 0.6 03/23/2010 1155   GFRNONAA >60 07/15/2019 1200   GFRAA >60 07/15/2019 1200    No results found for: TOTALPROTELP, ALBUMINELP, A1GS, A2GS, BETS, BETA2SER, GAMS, MSPIKE, SPEI  No results found for: KPAFRELGTCHN, LAMBDASER, KAPLAMBRATIO  Lab Results  Component Value Date   WBC 6.6 08/13/2016   NEUTROABS 4.0 03/23/2010   HGB 12.3 08/13/2016   HCT 37.0 08/13/2016   MCV 85.8 08/13/2016   PLT 332 08/13/2016    @LASTCHEMISTRY @  No results found for: LABCA2  No components found for: [id number]  No results for input(s): INR in the last 168 hours.  No results found for: LABCA2  No results found for: [id number]  No results found for: [id number]  No results found for: [id number]  No results found for: CA2729  No components found for: HGQUANT  No results found for: CEA1 / No results found for: CEA1   No results found for: AFPTUMOR  No results found for: CHROMOGRNA  No results found for: PSA1  No visits with results within 3 Day(s) from this visit.  Latest known visit with results is:  Admission on 07/17/2019, Discharged on 07/17/2019  Component Date Value Ref Range Status  . Sodium 07/15/2019 140  135 - 145 mmol/L Final  . Potassium 07/15/2019 3.9  3.5 - 5.1 mmol/L Final  . Chloride 07/15/2019 107  98 - 111 mmol/L Final  . CO2 07/15/2019 24  22 - 32 mmol/L Final  . Glucose, Bld 07/15/2019 237* 70 - 99 mg/dL Final  . BUN 07/15/2019 12  8 - 23 mg/dL Final  . Creatinine, Ser 07/15/2019 0.83  0.44 - 1.00 mg/dL Final  . Calcium 07/15/2019 10.3  8.9 - 10.3 mg/dL  Final  . GFR calc non Af Amer 07/15/2019 >60  >60 mL/min Final  . GFR calc Af Amer 07/15/2019 >60  >60 mL/min Final  . Anion gap 07/15/2019  9  5 - 15 Final   Performed at [facility], [place] [address], [place], [place] [address]  . Glucose-Capillary 07/17/2019 88  70 - 99 mg/dL Final  . Glucose-Capillary 07/17/2019 88  70 - 99 mg/dL Final    (this displays the last labs from the last 3 days)  No results found for: TOTALPROTELP, ALBUMINELP, A1GS, A2GS, BETS, BETA2SER, GAMS, MSPIKE, SPEI (this displays SPEP labs)  No results found for: KPAFRELGTCHN, LAMBDASER, KAPLAMBRATIO (kappa/lambda light chains)  No results found for: HGBA, HGBA2QUANT, HGBFQUANT, HGBSQUAN (Hemoglobinopathy evaluation)   No results found for: LDH  No results found for: IRON, TIBC, IRONPCTSAT (Iron and TIBC)  No results found for: FERRITIN  Urinalysis    Component Value Date/Time   COLORURINE YELLOW 03/23/2010 [place] 03/23/2010 1156   LABSPEC 1.011 03/23/2010 1156   PHURINE 7.0 03/23/2010 [place] 03/23/2010 [place] 03/23/2010 [place] 03/23/2010 1156   KETONESUR NEGATIVE 03/23/2010 1156   PROTEINUR NEGATIVE 03/23/2010 1156   UROBILINOGEN 0.2 03/23/2010 1156   NITRITE NEGATIVE 03/23/2010 1156   LEUKOCYTESUR SMALL (A) 03/23/2010 1156     STUDIES: No results found.  ELIGIBLE FOR AVAILABLE RESEARCH PROTOCOL: no  ASSESSMENT: 84 y.o. [place] woman  (1) status post right mastectomy 1999  (2) status post left breast biopsy 03/04/2019 for a clinical 2.9 cm noninvasive breast cancer (ductal carcinoma in situ), grade 3, estrogen receptor positive, progesterone receptor negative  (3) tamoxifen started March 2020  (4) left lumpectomy 07/17/2019 showed ductal carcinoma in situ measuring 2.7 cm, with close but negative margins  (5) genetics testing to be scheduled  PLAN: Marlane did well with her surgery and is tolerating  tamoxifen well.  Given the overall situation no adjuvant radiation is planned.  Instead she will take tamoxifen for 5 years.  Today we discussed again the possible toxicities side effects and complications of this agent.  She was paying $20 for a month supply and I wrote her a new prescription and gave her some hints as to where she might be able to obtain this at a cheaper cost.  We discussed advanced directives today and I gave her a copy of the form to complete.  She tells me she is planning to name her daughter present today as her healthcare power of attorney  She will have her left mammogram in March and return to see me in May of next year.  She knows to call for any other issue that may develop before then   Magrinat, Virgie Dad, MD  09/14/19 1:00 PM Medical Oncology and Hematology [facility] [place], [place] [address] Tel. [phone]    Fax. [phone]  I, Jacqualyn Posey am acting as a Education administrator for Chauncey Cruel, MD.   I, Lurline Del MD, have reviewed the above documentation for accuracy and completeness, and I agree with the above.

## 2019-09-14 ENCOUNTER — Other Ambulatory Visit: Payer: Self-pay

## 2019-09-14 ENCOUNTER — Encounter: Payer: Self-pay | Admitting: *Deleted

## 2019-09-14 ENCOUNTER — Inpatient Hospital Stay: Payer: Medicare Other | Admitting: Genetic Counselor

## 2019-09-14 ENCOUNTER — Inpatient Hospital Stay: Payer: Medicare Other | Attending: Oncology | Admitting: Oncology

## 2019-09-14 VITALS — BP 145/75 | HR 86 | Temp 98.5°F | Resp 17 | Ht 61.0 in | Wt 146.9 lb

## 2019-09-14 DIAGNOSIS — Z9011 Acquired absence of right breast and nipple: Secondary | ICD-10-CM | POA: Insufficient documentation

## 2019-09-14 DIAGNOSIS — Z79810 Long term (current) use of selective estrogen receptor modulators (SERMs): Secondary | ICD-10-CM | POA: Diagnosis not present

## 2019-09-14 DIAGNOSIS — Z170 Estrogen receptor positive status [ER+]: Secondary | ICD-10-CM | POA: Insufficient documentation

## 2019-09-14 DIAGNOSIS — D0512 Intraductal carcinoma in situ of left breast: Secondary | ICD-10-CM | POA: Insufficient documentation

## 2019-09-15 ENCOUNTER — Other Ambulatory Visit: Payer: Self-pay

## 2019-09-15 ENCOUNTER — Encounter: Payer: Self-pay | Admitting: Genetic Counselor

## 2019-09-15 ENCOUNTER — Inpatient Hospital Stay: Payer: Medicare Other

## 2019-09-15 ENCOUNTER — Other Ambulatory Visit: Payer: Self-pay | Admitting: Genetic Counselor

## 2019-09-15 ENCOUNTER — Inpatient Hospital Stay (HOSPITAL_BASED_OUTPATIENT_CLINIC_OR_DEPARTMENT_OTHER): Payer: Medicare Other | Admitting: Genetic Counselor

## 2019-09-15 DIAGNOSIS — K635 Polyp of colon: Secondary | ICD-10-CM

## 2019-09-15 DIAGNOSIS — Z9011 Acquired absence of right breast and nipple: Secondary | ICD-10-CM | POA: Diagnosis not present

## 2019-09-15 DIAGNOSIS — D0512 Intraductal carcinoma in situ of left breast: Secondary | ICD-10-CM

## 2019-09-15 DIAGNOSIS — Z8042 Family history of malignant neoplasm of prostate: Secondary | ICD-10-CM | POA: Diagnosis not present

## 2019-09-15 NOTE — Progress Notes (Signed)
REFERRING PROVIDER: Chauncey Cruel, MD [address] [place],  [place] [address]  PRIMARY PROVIDER:  Alroy Dust, Carlean Jews.[name], MD  PRIMARY REASON FOR VISIT:  1. Ductal carcinoma in situ (DCIS) of left breast   2. Family history of prostate cancer   3. History of right mastectomy   4. Polyp of colon, unspecified part of colon, unspecified type      HISTORY OF PRESENT ILLNESS:   Amanda Green, a 84 y.o. female, was seen for a [place] cancer genetics consultation at the request of Dr. Jana Hakim due to a personal and family history of cancer.  Amanda Green presents to clinic today to discuss the possibility of a hereditary predisposition to cancer, genetic testing, and to further clarify her future cancer risks, as well as potential cancer risks for family members.   In 1999, at the age of 62, Amanda Green was diagnosed with cancer of the right breast. The treatment plan included a mastectomy.  In 2020, at the age of 73, Amanda Green was diagnosed with DCIS of the left breast.  She reports that she is not receiving treatment.   CANCER HISTORY:  Oncology History   No history exists.     RISK FACTORS:  Menarche was at age 54.  First live birth at age 37.  OCP use for approximately 0 years.  Ovaries intact: yes.  Hysterectomy: no.  Menopausal status: postmenopausal.  HRT use: 0 years. Colonoscopy: no; not examined. Mammogram within the last year: yes. Number of breast biopsies: 2. Up to date with pelvic exams: no. Any excessive radiation exposure in the past: no  Past Medical History:  Diagnosis Date  . Arthritis   . Breast cancer, left ([place]) 05/2019   breast- right, mastectomy, 1999  . Diabetes mellitus   . Dislocation of left shoulder joint   . Family history of prostate cancer   . Hyperlipidemia   . Hypertension   . Numbness and tingling of right lower extremity   . Stroke [facility])    light stroke - 2012 ,right leg nerve damage     Past Surgical History:  Procedure  Laterality Date  . ABDOMINAL HYSTERECTOMY    . APPENDECTOMY    . BREAST BIOPSY Left 01/19/2010  . BREAST LUMPECTOMY WITH RADIOACTIVE SEED LOCALIZATION Left 07/17/2019   Procedure: LEFT BREAST LUMPECTOMY WITH RADIOACTIVE SEED LOCALIZATION;  Surgeon: Johnathan [place], MD;  Location: [place];  Service: General;  Laterality: Left;  . BREAST SURGERY     Right mastectomy 1999  . CARPAL TUNNEL RELEASE Right 09/30/2014   Procedure: RIGHT CARPAL TUNNEL RELEASE;  Surgeon: Daryll Brod, MD;  Location: [place];  Service: Orthopedics;  Laterality: Right;  . COLONOSCOPY    . EYE SURGERY     cataracts  . JOINT REPLACEMENT     lt total knee 2011  . LIPOMA EXCISION  11/28/2011   Procedure: EXCISION LIPOMA;  Surgeon: Pedro Earls, MD;  Location: [place];  Service: General;  Laterality: N/A;  excisino lipoma 4 cm back of neck  . MASTECTOMY Right 1999    Social History   Socioeconomic History  . Marital status: Married    Spouse name: Not on file  . Number of children: 5  . Years of education: HS - 12 years  . Highest education level: Not on file  Occupational History  . Occupation: Retired  Scientific laboratory technician  . Financial resource strain: Not on file  . Food insecurity  Worry: Not on file    Inability: Not on file  . Transportation needs    Medical: Not on file    Non-medical: Not on file  Tobacco Use  . Smoking status: Never Smoker  . Smokeless tobacco: Never Used  Substance and Sexual Activity  . Alcohol use: No  . Drug use: No  . Sexual activity: Not on file  Lifestyle  . Physical activity    Days per week: Not on file    Minutes per session: Not on file  . Stress: Not on file  Relationships  . Social Herbalist on phone: Not on file    Gets together: Not on file    Attends religious service: Not on file    Active member of club or organization: Not on file    Attends meetings of clubs or organizations: Not on file     Relationship status: Not on file  Other Topics Concern  . Not on file  Social History Narrative   Lives at home with her husband.   Right-handed.   1 cup caffeine daily.     FAMILY HISTORY:  We obtained a detailed, 4-generation family history.  Significant diagnoses are listed below: Family History  Problem Relation Age of Onset  . Esophageal cancer Mother        smoker  . Other Father        Unsure of medical history.  . Prostate cancer Brother        d. 62s  . Cancer Son        ?? unsure, but may have had cancer  . Cancer Son        ?? unsure, but may have had cancer    The patient had five children, two sons and three daughters.  Both sons have died, one in his 73's and the other in his 68's.  She thinks that they may have had some form of cancer.  All three daughters are cancer free.  The patient has one brother who died of prostate cancer.  Both parents are deceased.  The patient's father died in his 41's from possibly a heart attack.  The patient is not aware of her father's family history.  The patient mother was a smoker and died in her 54's.  She had several siblings, one sister died of throat cancer.  The maternal grandparents are deceased.  Amanda Green is unaware of previous family history of genetic testing for hereditary cancer risks. Patient's maternal ancestors are of African American descent, and paternal ancestors are of African American descent. There is no reported Ashkenazi Jewish ancestry. There is no known consanguinity.  GENETIC COUNSELING ASSESSMENT: Amanda Green is a 84 y.o. female with a personal and family history of cancer which is somewhat suggestive of a hereditary cancer syndrome and predisposition to cancer given her bilateral breast cancer combined with the family history of prostate cancer. We, therefore, discussed and recommended the following at today's visit.   DISCUSSION: We discussed that 5 - 10% of breast cancer is hereditary, with most cases  associated with BRCA mutations.  There are other genes that can be associated with hereditary breast cancer syndromes.  We discussed that testing is beneficial for several reasons including knowing how to follow individuals after completing their treatment, identifying whether potential treatment options such as PARP inhibitors would be beneficial, and understand if other family members could be at risk for cancer and allow them to undergo genetic testing.  We reviewed the characteristics, features and inheritance patterns of hereditary cancer syndromes. We also discussed genetic testing, including the appropriate family members to test, the process of testing, insurance coverage and turn-around-time for results. We discussed the implications of a negative, positive, carrier and/or variant of uncertain significant result. We recommended Amanda Green pursue genetic testing for the common hereditary cancer gene panel. The Common Hereditary Gene Panel offered by [facility] includes sequencing and/or deletion duplication testing of the following 48 genes: APC, ATM, AXIN2, BARD1, BMPR1A, BRCA1, BRCA2, BRIP1, CDH1, CDK4, CDKN2A (p14ARF), CDKN2A (p16INK4a), CHEK2, CTNNA1, DICER1, EPCAM (Deletion/duplication testing only), GREM1 (promoter region deletion/duplication testing only), KIT, MEN1, MLH1, MSH2, MSH3, MSH6, MUTYH, NBN, NF1, NHTL1, PALB2, PDGFRA, PMS2, POLD1, POLE, PTEN, RAD50, RAD51C, RAD51D, RNF43, SDHB, SDHC, SDHD, SMAD4, SMARCA4. STK11, TP53, TSC1, TSC2, and VHL.  The following genes were evaluated for sequence changes only: SDHA and HOXB13 c.251G>A variant only.   Based on Amanda Green's personal and family history of cancer, she meets medical criteria for genetic testing. Despite that she meets criteria, she may still have an out of pocket cost. We discussed that if her out of pocket cost for testing is over $100, the laboratory will call and confirm whether she wants to proceed with testing.  If the out of  pocket cost of testing is less than $100 she will be billed by the genetic testing laboratory.   PLAN: After considering the risks, benefits, and limitations, Amanda Green provided informed consent to pursue genetic testing and the blood sample was sent to [facility] for analysis of the Common Hereditary Cancer Panel. Results should be available within approximately 2-3 weeks' time, at which point they will be disclosed by telephone to Amanda Green, as will any additional recommendations warranted by these results. Amanda Green will receive a summary of her genetic counseling visit and a copy of her results once available. This information will also be available in Epic.   Lastly, we encouraged Amanda Green to remain in contact with cancer genetics annually so that we can continuously update the family history and inform her of any changes in cancer genetics and testing that may be of benefit for this family.   Amanda Green questions were answered to her satisfaction today. Our contact information was provided should additional questions or concerns arise. Thank you for the referral and allowing [place] to share in the care of your patient.   Lakrista Scaduto P. Florene Glen, [place], [facility] Licensed, Insurance risk surveyor [email or web address] phone: [phone]  The patient was seen for a total of 30 minutes in face-to-face genetic counseling.  This patient was discussed with Drs. Magrinat, Lindi Adie and/or Burr Medico who agrees with the above.    _______________________________________________________________________ For Office Staff:  Number of people involved in session: 1 Was an Intern/ student involved with case: no

## 2019-09-16 ENCOUNTER — Telehealth: Payer: Self-pay | Admitting: Oncology

## 2019-09-16 NOTE — Telephone Encounter (Signed)
I talk with patient regarding schedule  

## 2019-09-25 ENCOUNTER — Encounter: Payer: Self-pay | Admitting: Genetic Counselor

## 2019-09-25 ENCOUNTER — Telehealth: Payer: Self-pay | Admitting: Genetic Counselor

## 2019-09-25 DIAGNOSIS — Z1379 Encounter for other screening for genetic and chromosomal anomalies: Secondary | ICD-10-CM | POA: Insufficient documentation

## 2019-09-25 NOTE — Telephone Encounter (Signed)
Revealed negative genetic testing.  Discussed that we do not know why she has breast cancer or why there is cancer in the family. It could be due to a different gene that we are not testing, or maybe our current technology may not be able to pick something up.  It will be important for her to keep in contact with genetics to keep up with whether additional testing may be needed.  Revealed that testing found a VUS in EPCAM.  This is still considered a normal test.

## 2019-09-29 ENCOUNTER — Ambulatory Visit: Payer: Self-pay | Admitting: Genetic Counselor

## 2019-09-29 DIAGNOSIS — Z1379 Encounter for other screening for genetic and chromosomal anomalies: Secondary | ICD-10-CM

## 2019-09-29 DIAGNOSIS — D0512 Intraductal carcinoma in situ of left breast: Secondary | ICD-10-CM

## 2019-09-29 NOTE — Progress Notes (Signed)
HPI:  Amanda Green was previously seen in the [facility] due to a personal and family history of cancer and concerns regarding a hereditary predisposition to cancer. Please refer to our prior cancer genetics clinic note for more information regarding our discussion, assessment and recommendations, at the time. Amanda Green recent genetic test results were disclosed to her, as were recommendations warranted by these results. These results and recommendations are discussed in more detail below.  CANCER HISTORY:  Oncology History  Ductal carcinoma in situ (DCIS) of left breast  06/10/2019 Initial Diagnosis   Ductal carcinoma in situ (DCIS) of left breast   09/25/2019 Genetic Testing   EPCAM VUS but otherwise negative genetic testing on the common hereditary cancer panel.  The Common Hereditary Gene Panel offered by [facility] includes sequencing and/or deletion duplication testing of the following 48 genes: APC, ATM, AXIN2, BARD1, BMPR1A, BRCA1, BRCA2, BRIP1, CDH1, CDK4, CDKN2A (p14ARF), CDKN2A (p16INK4a), CHEK2, CTNNA1, DICER1, EPCAM (Deletion/duplication testing only), GREM1 (promoter region deletion/duplication testing only), KIT, MEN1, MLH1, MSH2, MSH3, MSH6, MUTYH, NBN, NF1, NHTL1, PALB2, PDGFRA, PMS2, POLD1, POLE, PTEN, RAD50, RAD51C, RAD51D, RNF43, SDHB, SDHC, SDHD, SMAD4, SMARCA4. STK11, TP53, TSC1, TSC2, and VHL.  The following genes were evaluated for sequence changes only: SDHA and HOXB13 c.251G>A variant only. The report date is 09/25/2019.     FAMILY HISTORY:  We obtained a detailed, 4-generation family history.  Significant diagnoses are listed below: Family History  Problem Relation Age of Onset  . Esophageal cancer Mother        smoker  . Other Father        Unsure of medical history.  . Prostate cancer Brother        d. 45s  . Cancer Son        ?? unsure, but may have had cancer  . Cancer Son        ?? unsure, but may have had cancer    The patient had five  children, two sons and three daughters.  Both sons have died, one in his 12's and the other in his 40's.  She thinks that they may have had some form of cancer.  All three daughters are cancer free.  The patient has one brother who died of prostate cancer.  Both parents are deceased.  The patient's father died in his 105's from possibly a heart attack.  The patient is not aware of her father's family history.  The patient mother was a smoker and died in her 1's.  She had several siblings, one sister died of throat cancer.  The maternal grandparents are deceased.  Amanda Green is unaware of previous family history of genetic testing for hereditary cancer risks. Patient's maternal ancestors are of African American descent, and paternal ancestors are of African American descent. There is no reported Ashkenazi Jewish ancestry. There is no known consanguinity.    GENETIC TEST RESULTS: Genetic testing reported out on September 25, 2019 through the common hereditary cancer panel found no pathogenic mutations. The Common Hereditary Gene Panel offered by [facility] includes sequencing and/or deletion duplication testing of the following 48 genes: APC, ATM, AXIN2, BARD1, BMPR1A, BRCA1, BRCA2, BRIP1, CDH1, CDK4, CDKN2A (p14ARF), CDKN2A (p16INK4a), CHEK2, CTNNA1, DICER1, EPCAM (Deletion/duplication testing only), GREM1 (promoter region deletion/duplication testing only), KIT, MEN1, MLH1, MSH2, MSH3, MSH6, MUTYH, NBN, NF1, NHTL1, PALB2, PDGFRA, PMS2, POLD1, POLE, PTEN, RAD50, RAD51C, RAD51D, RNF43, SDHB, SDHC, SDHD, SMAD4, SMARCA4. STK11, TP53, TSC1, TSC2, and VHL.  The following genes were  evaluated for sequence changes only: SDHA and HOXB13 c.251G>A variant only. The test report has been scanned into EPIC and is located under the Molecular Pathology section of the Results Review tab.  A portion of the result report is included below for reference.     We discussed with Amanda Green that because current genetic testing  is not perfect, it is possible there may be a gene mutation in one of these genes that current testing cannot detect, but that chance is small.  We also discussed, that there could be another gene that has not yet been discovered, or that we have not yet tested, that is responsible for the cancer diagnoses in the family. It is also possible there is a hereditary cause for the cancer in the family that Amanda Green did not inherit and therefore was not identified in her testing.  Therefore, it is important to remain in touch with cancer genetics in the future so that we can continue to offer Amanda Green the most up to date genetic testing.   Genetic testing did identify a variant of uncertain significance (VUS) was identified in the EPCAM gene called Gain (Exons 8-9) copy number = 3.  At this time, it is unknown if this variant is associated with increased cancer risk or if this is a normal finding, but most variants such as this get reclassified to being inconsequential. It should not be used to make medical management decisions. With time, we suspect the lab will determine the significance of this variant, if any. If we do learn more about it, we will try to contact Amanda Green to discuss it further. However, it is important to stay in touch with us periodically and keep the address and phone number up to date.  ADDITIONAL GENETIC TESTING: We discussed with Amanda Green that there are other genes that are associated with increased cancer risk that can be analyzed. Should Amanda Green wish to pursue additional genetic testing, we are happy to discuss and coordinate this testing, at any time.    CANCER SCREENING RECOMMENDATIONS: Amanda Green's test result is considered negative (normal).  This means that we have not identified a hereditary cause for her personal and family history of cancer at this time. Most cancers happen by chance and this negative test suggests that her cancer may fall into this category.    While  reassuring, this does not definitively rule out a hereditary predisposition to cancer. It is still possible that there could be genetic mutations that are undetectable by current technology. There could be genetic mutations in genes that have not been tested or identified to increase cancer risk.  Therefore, it is recommended she continue to follow the cancer management and screening guidelines provided by her oncology and primary healthcare provider.   An individual's cancer risk and medical management are not determined by genetic test results alone. Overall cancer risk assessment incorporates additional factors, including personal medical history, family history, and any available genetic information that may result in a personalized plan for cancer prevention and surveillance  RECOMMENDATIONS FOR FAMILY MEMBERS:  Individuals in this family might be at some increased risk of developing cancer, over the general population risk, simply due to the family history of cancer.  We recommended women in this family have a yearly mammogram beginning at age 40, or 10 years younger than the earliest onset of cancer, an annual clinical breast exam, and perform monthly breast self-exams. Women in this family should also have a gynecological   exam as recommended by their primary provider. All family members should have a colonoscopy by age 50.  FOLLOW-UP: Lastly, we discussed with Amanda Green that cancer genetics is a rapidly advancing field and it is possible that new genetic tests will be appropriate for her and/or her family members in the future. We encouraged her to remain in contact with cancer genetics on an annual basis so we can update her personal and family histories and let her know of advances in cancer genetics that may benefit this family.   Our contact number was provided. Amanda Green's questions were answered to her satisfaction, and she knows she is welcome to call us at anytime with additional questions  or concerns.    , MS, LCGC Licensed, Certified Genetic Counselor [email or web address]  

## 2019-10-06 ENCOUNTER — Ambulatory Visit: Payer: Medicare Other | Admitting: Podiatry

## 2020-02-26 ENCOUNTER — Other Ambulatory Visit: Payer: Self-pay

## 2020-02-26 ENCOUNTER — Ambulatory Visit
Admission: RE | Admit: 2020-02-26 | Discharge: 2020-02-26 | Disposition: A | Discharge status: Home / Self Care | Payer: Medicare PPO | Source: Ambulatory Visit | Attending: Oncology | Admitting: Oncology

## 2020-02-26 DIAGNOSIS — D0512 Intraductal carcinoma in situ of left breast: Secondary | ICD-10-CM

## 2020-05-09 NOTE — Progress Notes (Signed)
[facility]  [phone] [phone]     ID: Amanda Green DOB: October 12, 1935  MR#: [id number]  QQI#:[id number]  Patient Care Team: Alroy Dust, Carlean Jews.[name], MD as PCP - General (Family Medicine) Johnathan Hausen, MD as Consulting Physician (General Surgery) Magrinat, Virgie Dad, MD as Consulting Physician (Oncology) Gery Pray, MD as Consulting Physician (Radiation Oncology) Aurea Graff OTHER MD:  CHIEF COMPLAINT: Estrogen receptor positive ductal carcinoma in situ (s/p right mastectomy)  CURRENT TREATMENT: Tamoxifen    INTERVAL HISTORY: Amanda Green did not show for her appointment 05/10/2020  Since her last visit, she underwent genetic counseling on 09/15/2019. Results were negative with a variant of uncertain significance in EPCAM.  Since her last visit, she underwent left diagnostic mammography with tomography at [facility] on 02/26/2020 showing: breast density category B; no evidence of malignancy in either breast.    REVIEW OF SYSTEMS: Amanda Green    HISTORY OF CURRENT ILLNESS: From the original intake note:  Amanda Green had routine left screening mammography on 02/24/2019 showing a possible abnormality. Of note, she has a history of right mastectomy in 1999 under Dr. Hassell Done.   She underwent left diagnostic mammography at [facility] on 03/04/2019 showing: breast density category B; indeterminate calcifications in the upper-outer left breast measuring 2.9 cm.  Accordingly on 03/04/2019 she proceeded to biopsy of the left breast area in question. The pathology from this procedure ([id number]) showed: ductal carcinoma in situ with calcifications, grade 3. Prognostic indicators significant for: estrogen receptor, 90% positive with strong staining intensity and progesterone receptor, 0% negative.  She was started on tamoxifen shortly after by Dr. Hassell Done due to surgery delay for pandemic concerns.  She is scheduled for definitive left  breast surgery 07/17/2019  The patient's subsequent history is as detailed below.   PAST MEDICAL HISTORY: Past Medical History:  Diagnosis Date  . Arthritis   . Breast cancer ([facility]) 1999   right mastectomy  . Breast cancer, left ([facility]) 05/2019   left lumpectomy  . Diabetes mellitus   . Dislocation of left shoulder joint   . Family history of prostate cancer   . Hyperlipidemia   . Hypertension   . Numbness and tingling of right lower extremity   . Stroke ([facility])    light stroke - 2012 ,right leg nerve damage     PAST SURGICAL HISTORY: Past Surgical History:  Procedure Laterality Date  . ABDOMINAL HYSTERECTOMY    . APPENDECTOMY    . BREAST BIOPSY Left 01/19/2010  . BREAST LUMPECTOMY WITH RADIOACTIVE SEED LOCALIZATION Left 07/17/2019   Procedure: LEFT BREAST LUMPECTOMY WITH RADIOACTIVE SEED LOCALIZATION;  Surgeon: Johnathan Hausen, MD;  Location: [facility];  Service: General;  Laterality: Left;  . BREAST SURGERY     Right mastectomy 1999  . CARPAL TUNNEL RELEASE Right 09/30/2014   Procedure: RIGHT CARPAL TUNNEL RELEASE;  Surgeon: Daryll Brod, MD;  Location: [facility];  Service: Orthopedics;  Laterality: Right;  . COLONOSCOPY    . EYE SURGERY     cataracts  . JOINT REPLACEMENT     lt total knee 2011  . LIPOMA EXCISION  11/28/2011   Procedure: EXCISION LIPOMA;  Surgeon: Pedro Earls, MD;  Location: [facility];  Service: General;  Laterality: N/A;  excisino lipoma 4 cm back of neck  . MASTECTOMY Right 1999    FAMILY HISTORY: Family History  Problem Relation Age of Onset  . Esophageal cancer Mother  smoker  . Other Father        Unsure of medical history.  . Prostate cancer Brother        d. 71s  . Cancer Son        ?? unsure, but may have had cancer  . Cancer Son        ?? unsure, but may have had cancer  Patient's father died from lung cancer. Patient's mother died from esophageal cancer. The patient denies a  family hx of breast or ovarian cancer. She has 1 brother. He was diagnosed with prostate cancer.   GYNECOLOGIC HISTORY:  No LMP recorded. Patient is postmenopausal. Menarche: 85 years old Age at first live birth: 85 years old [place] P 5 LMP age 55 Hysterectomy? Yes, age 9, uterus only BSO? No Hormone replacement?   SOCIAL HISTORY: (updated 06/10/2019)  Amanda Green is originally from [place].  She is currently retired. She is widowed. She lives at home with 1 of her granddaughteris, who is 32 and works at the [employer].  Another of her granddaughters Amanda Green works here in our center.    ADVANCED DIRECTIVES: not in place; she plans to name her daughter(Amanda Green as her healthcare power of attorney.  At the 09/14/2019 visit she was given the appropriate forms to complete and notarize at her discretion   HEALTH MAINTENANCE: Social History   Tobacco Use  . Smoking status: Never Smoker  . Smokeless tobacco: Never Used  Substance Use Topics  . Alcohol use: No  . Drug use: No     Colonoscopy: 03/2011, Dr. Penelope Coop, benign polyp  PAP: not on file, remote abdominal hysterectomy  Bone density: scheduled for 06/22/2019   No Known Allergies  Current Outpatient Medications  Medication Sig Dispense Refill  . amLODipine (NORVASC) 10 MG tablet Take 10 mg by mouth daily.     Marland Kitchen aspirin EC 81 MG tablet Take 81 mg by mouth daily.    Marland Kitchen atorvastatin (LIPITOR) 40 MG tablet Take 40 mg by mouth daily.     . bimatoprost (LUMIGAN) 0.01 % SOLN Place 1 drop into both eyes at bedtime.    . brimonidine-timolol (COMBIGAN) 0.2-0.5 % ophthalmic solution Place 1 drop into both eyes every 12 (twelve) hours.    . brinzolamide (AZOPT) 1 % ophthalmic suspension 1 drop 3 (three) times daily.    . Calcium Carbonate-Vitamin D (CALTRATE 600+D PO) Take 1 tablet by mouth daily.     . cloNIDine (CATAPRES) 0.1 MG tablet Take 0.1 mg by mouth 2 (two) times daily.     Marland Kitchen gabapentin (NEURONTIN) 400 MG capsule Take 400 mg by mouth 3  (three) times daily.    Marland Kitchen glimepiride (AMARYL) 2 MG tablet Take 2 mg by mouth daily.     . hepatitis A vaccine (VAQTA) 50 UNIT/ML injection Inject 50 Units into the muscle once.    . hydrochlorothiazide (HYDRODIURIL) 25 MG tablet Take 25 mg by mouth daily.    Marland Kitchen HYDROcodone-acetaminophen (NORCO/VICODIN) 5-325 MG tablet Take 1 tablet by mouth every 6 (six) hours as needed for moderate pain. 15 tablet 0  . metFORMIN (GLUCOPHAGE) 500 MG tablet Take 1,000 mg by mouth 2 (two) times daily with a meal.     . Multiple Vitamins-Minerals (CENTRUM ADULTS PO) Take 1 tablet by mouth daily.      No current facility-administered medications for this visit.    OBJECTIVE:   There were no vitals filed for this visit. Wt Readings from Last 3 Encounters:  09/14/19 146  lb 14.4 oz (66.6 kg)  07/17/19 142 lb 3.2 oz (64.5 kg)  01/28/17 149 lb (67.6 kg)   There is no height or weight on file to calculate BMI.    ECOG FS:  LAB RESULTS:  CMP     Component Value Date/Time   NA 140 07/15/2019 1200   K 3.9 07/15/2019 1200   CL 107 07/15/2019 1200   CO2 24 07/15/2019 1200   GLUCOSE 237 (H) 07/15/2019 1200   BUN 12 07/15/2019 1200   CREATININE 0.83 07/15/2019 1200   CALCIUM 10.3 07/15/2019 1200   PROT 6.9 03/23/2010 1155   ALBUMIN 4.1 03/23/2010 1155   AST 19 03/23/2010 1155   ALT 19 03/23/2010 1155   ALKPHOS 72 03/23/2010 1155   BILITOT 0.6 03/23/2010 1155   GFRNONAA >60 07/15/2019 1200   GFRAA >60 07/15/2019 1200    No results found for: TOTALPROTELP, ALBUMINELP, A1GS, A2GS, BETS, BETA2SER, GAMS, MSPIKE, SPEI  No results found for: KPAFRELGTCHN, LAMBDASER, KAPLAMBRATIO  Lab Results  Component Value Date   WBC 6.6 08/13/2016   NEUTROABS 4.0 03/23/2010   HGB 12.3 08/13/2016   HCT 37.0 08/13/2016   MCV 85.8 08/13/2016   PLT 332 08/13/2016   No results found for: LABCA2  No components found for: AJOINO676  No results for input(s): INR in the last 168 hours.  No results found for:  LABCA2  No results found for: HMC947  No results found for: SJG283  No results found for: MOQ947  No results found for: CA2729  No components found for: HGQUANT  No results found for: CEA1 / No results found for: CEA1   No results found for: AFPTUMOR  No results found for: CHROMOGRNA  No results found for: HGBA, HGBA2QUANT, HGBFQUANT, HGBSQUAN (Hemoglobinopathy evaluation)   No results found for: LDH  No results found for: IRON, TIBC, IRONPCTSAT (Iron and TIBC)  No results found for: FERRITIN  Urinalysis    Component Value Date/Time   COLORURINE YELLOW 03/23/2010 [place] 03/23/2010 1156   LABSPEC 1.011 03/23/2010 1156   PHURINE 7.0 03/23/2010 [place] 03/23/2010 [place] 03/23/2010 [place] 03/23/2010 1156   KETONESUR NEGATIVE 03/23/2010 1156   PROTEINUR NEGATIVE 03/23/2010 1156   UROBILINOGEN 0.2 03/23/2010 1156   NITRITE NEGATIVE 03/23/2010 1156   LEUKOCYTESUR SMALL (A) 03/23/2010 1156    STUDIES: No results found.   ELIGIBLE FOR AVAILABLE RESEARCH PROTOCOL: no  ASSESSMENT: 85 y.o. [place] woman  (1) status post right mastectomy 1999  (2) status post left breast biopsy 03/04/2019 for a clinical 2.9 cm noninvasive breast cancer (ductal carcinoma in situ), grade 3, estrogen receptor positive, progesterone receptor negative  (3) tamoxifen started March 2020  (4) left lumpectomy 07/17/2019 showed ductal carcinoma in situ measuring 2.7 cm, with close but negative margins  (5) genetics testing to be scheduled  (a) EPCAM VUS but otherwise negative genetic testing on the common hereditary cancer panel.  The Common Hereditary Gene Panel offered by [facility] includes sequencing and/or deletion duplication testing of the following 48 genes: APC, ATM, AXIN2, BARD1, BMPR1A, BRCA1, BRCA2, BRIP1, CDH1, CDK4, CDKN2A (p14ARF), CDKN2A (p16INK4a), CHEK2, CTNNA1, DICER1, EPCAM (Deletion/duplication testing  only), GREM1 (promoter region deletion/duplication testing only), KIT, MEN1, MLH1, MSH2, MSH3, MSH6, MUTYH, NBN, NF1, NHTL1, PALB2, PDGFRA, PMS2, POLD1, POLE, PTEN, RAD50, RAD51C, RAD51D, RNF43, SDHB, SDHC, SDHD, SMAD4, SMARCA4. STK11, TP53, TSC1, TSC2, and VHL.  The following genes were evaluated for sequence changes only:  SDHA and HOXB13 c.251G>A variant only. The report date is 09/25/2019.   PLAN: Mackensie did not show for her appointment 05/10/2020.  A follow-up letter was sent.  Magrinat, Virgie Dad, MD  05/09/20 11:23 PM Medical Oncology and Hematology [facility] [place], [place] [address] Tel. [phone]    Fax. [phone]   I, Wilburn Mylar, am acting as scribe for Dr. Virgie Dad. Magrinat.  I, Lurline Del MD, have reviewed the above documentation for accuracy and completeness, and I agree with the above.    *Total Encounter Time as defined by the Centers for Medicare and Medicaid Services includes, in addition to the face-to-face time of a patient visit (documented in the note above) non-face-to-face time: obtaining and reviewing outside history, ordering and reviewing medications, tests or procedures, care coordination (communications with other health care professionals or caregivers) and documentation in the medical record.

## 2020-05-10 ENCOUNTER — Inpatient Hospital Stay: Payer: Medicare PPO | Attending: Oncology | Admitting: Oncology

## 2020-05-10 DIAGNOSIS — D0512 Intraductal carcinoma in situ of left breast: Secondary | ICD-10-CM

## 2020-05-11 ENCOUNTER — Encounter: Payer: Self-pay | Admitting: Oncology

## 2020-05-11 DIAGNOSIS — H26491 Other secondary cataract, right eye: Secondary | ICD-10-CM | POA: Diagnosis not present

## 2020-05-23 DIAGNOSIS — H26492 Other secondary cataract, left eye: Secondary | ICD-10-CM | POA: Diagnosis not present

## 2020-05-25 ENCOUNTER — Telehealth: Payer: Self-pay | Admitting: Oncology

## 2020-05-25 NOTE — Telephone Encounter (Signed)
Scheduled per 6/9 sch message. Pt aware of appt.

## 2020-06-02 ENCOUNTER — Ambulatory Visit: Payer: Medicare PPO | Admitting: Oncology

## 2020-06-08 ENCOUNTER — Encounter: Payer: Self-pay | Admitting: Adult Health

## 2020-06-08 ENCOUNTER — Inpatient Hospital Stay: Payer: Medicare PPO | Attending: Adult Health | Admitting: Adult Health

## 2020-06-08 ENCOUNTER — Other Ambulatory Visit: Payer: Self-pay

## 2020-06-08 VITALS — BP 160/85 | HR 101 | Temp 98.5°F | Resp 18 | Ht 61.0 in | Wt 138.1 lb

## 2020-06-08 DIAGNOSIS — D0512 Intraductal carcinoma in situ of left breast: Secondary | ICD-10-CM

## 2020-06-08 DIAGNOSIS — Z79810 Long term (current) use of selective estrogen receptor modulators (SERMs): Secondary | ICD-10-CM | POA: Diagnosis not present

## 2020-06-08 DIAGNOSIS — E119 Type 2 diabetes mellitus without complications: Secondary | ICD-10-CM | POA: Insufficient documentation

## 2020-06-08 DIAGNOSIS — Z801 Family history of malignant neoplasm of trachea, bronchus and lung: Secondary | ICD-10-CM | POA: Insufficient documentation

## 2020-06-08 DIAGNOSIS — C50412 Malignant neoplasm of upper-outer quadrant of left female breast: Secondary | ICD-10-CM | POA: Insufficient documentation

## 2020-06-08 DIAGNOSIS — Z170 Estrogen receptor positive status [ER+]: Secondary | ICD-10-CM | POA: Diagnosis not present

## 2020-06-08 DIAGNOSIS — E785 Hyperlipidemia, unspecified: Secondary | ICD-10-CM | POA: Diagnosis not present

## 2020-06-08 DIAGNOSIS — Z7984 Long term (current) use of oral hypoglycemic drugs: Secondary | ICD-10-CM | POA: Insufficient documentation

## 2020-06-08 DIAGNOSIS — Z853 Personal history of malignant neoplasm of breast: Secondary | ICD-10-CM | POA: Insufficient documentation

## 2020-06-08 DIAGNOSIS — Z7982 Long term (current) use of aspirin: Secondary | ICD-10-CM | POA: Insufficient documentation

## 2020-06-08 DIAGNOSIS — I10 Essential (primary) hypertension: Secondary | ICD-10-CM | POA: Diagnosis not present

## 2020-06-08 DIAGNOSIS — Z8673 Personal history of transient ischemic attack (TIA), and cerebral infarction without residual deficits: Secondary | ICD-10-CM | POA: Diagnosis not present

## 2020-06-08 DIAGNOSIS — Z79899 Other long term (current) drug therapy: Secondary | ICD-10-CM | POA: Insufficient documentation

## 2020-06-08 DIAGNOSIS — Z9011 Acquired absence of right breast and nipple: Secondary | ICD-10-CM | POA: Insufficient documentation

## 2020-06-08 NOTE — Progress Notes (Signed)
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  She   Since her last visit, she underwent left diagnostic mammography with tomography at [facility] on 02/26/2020 showing: breast density category B; no evidence of malignancy in either breast.    REVIEW OF SYSTEMS: Amanda Green is unsure if she is taking Tamoxifen daily.  She is active some, and continues ot live alone withfamily nearby.  She denies any new pain, or issues.  She sees her PCP regularly, and denies any new issues, such as fever, chills, chest pain, palpitations, cough, shortness of breath, headaches, vision issues, nausea, vomiting, bowel or bladder concerns.  A detailed ROS was otherwise non contributory.     HISTORY OF CURRENT ILLNESS: From the original intake note:  Amanda Green had routine left screening mammography on 02/24/2019 showing a possible abnormality. Of note, she has a history of right mastectomy in 1999 under Dr. Martin.   She underwent left diagnostic mammography at [facility] on 03/04/2019 showing: breast density category B; indeterminate calcifications in the upper-outer left breast measuring 2.9 cm.  Accordingly on 03/04/2019 she proceeded to biopsy of the left breast area in question. The pathology from this procedure ([id number]) showed: ductal carcinoma in situ with  calcifications, grade 3. Prognostic indicators significant for: estrogen receptor, 90% positive with strong staining intensity and progesterone receptor, 0% negative.  She was started on tamoxifen shortly after by Dr. Martin due to surgery delay for pandemic concerns.  She is scheduled for definitive left breast surgery 07/17/2019  The patient's subsequent history is as detailed below.   PAST MEDICAL HISTORY: Past Medical History:  Diagnosis Date  . Arthritis   . Breast cancer (HCC) 1999   right mastectomy  . Breast cancer, left (HCC) 05/2019   left lumpectomy  . Diabetes mellitus   . Dislocation of left shoulder joint   . Family history of prostate cancer   . Hyperlipidemia   . Hypertension   . Numbness and tingling of right lower extremity   . Stroke (HCC)    light stroke - 2012 ,right leg nerve damage     PAST SURGICAL HISTORY: Past Surgical History:  Procedure Laterality Date  . ABDOMINAL HYSTERECTOMY    . APPENDECTOMY    . BREAST BIOPSY Left 01/19/2010  . BREAST LUMPECTOMY WITH RADIOACTIVE SEED LOCALIZATION Left 07/17/2019   Procedure: LEFT BREAST LUMPECTOMY WITH RADIOACTIVE SEED LOCALIZATION;  Surgeon: Martin, Matthew, MD;  Location: [facility];  Service: General;  Laterality: Left;  . BREAST SURGERY     Right mastectomy 1999  . CARPAL TUNNEL RELEASE Right 09/30/2014   Procedure: RIGHT CARPAL TUNNEL RELEASE;  Surgeon: Gary Kuzma, MD;  Location: [facility];  Service: Orthopedics;  Laterality: Right;  . COLONOSCOPY    . EYE SURGERY     cataracts  . JOINT REPLACEMENT     lt total knee 2011  . LIPOMA EXCISION  11/28/2011     Procedure: EXCISION LIPOMA;  Surgeon: Pedro Earls, MD;  Location: [facility];  Service: General;  Laterality: N/A;  excisino lipoma 4 cm back of neck  . MASTECTOMY Right 1999    FAMILY HISTORY: Family History  Problem Relation Age of Onset  . Esophageal cancer Mother        smoker  . Other Father         Unsure of medical history.  . Prostate cancer Brother        d. 75s  . Cancer Son        ?? unsure, but may have had cancer  . Cancer Son        ?? unsure, but may have had cancer  Patient's father died from lung cancer. Patient's mother died from esophageal cancer. The patient denies a family hx of breast or ovarian cancer. She has 1 brother. He was diagnosed with prostate cancer.   GYNECOLOGIC HISTORY:  No LMP recorded. Patient is postmenopausal. Menarche: 85 years old Age at first live birth: 85 years old [place] P 5 LMP age 59 Hysterectomy? Yes, age 85, uterus only BSO? No Hormone replacement?   SOCIAL HISTORY: (updated 06/10/2019)  Amanda Green is originally from [place].  She is currently retired. She is widowed. She lives at home with 1 of her granddaughteris, who is 62 and works at the [employer].  Another of her granddaughters [name] works here in our center.    ADVANCED DIRECTIVES: not in place; she plans to name her daughter(Amanda Green as her healthcare power of attorney.  At the 09/14/2019 visit she was given the appropriate forms to complete and notarize at her discretion   HEALTH MAINTENANCE: Social History   Tobacco Use  . Smoking status: Never Smoker  . Smokeless tobacco: Never Used  Substance Use Topics  . Alcohol use: No  . Drug use: No     Colonoscopy: 03/2011, Dr. Penelope Coop, benign polyp  PAP: not on file, remote abdominal hysterectomy  Bone density: scheduled for 06/22/2019   No Known Allergies  Current Outpatient Medications  Medication Sig Dispense Refill  . amLODipine (NORVASC) 10 MG tablet Take 10 mg by mouth daily.     Marland Kitchen aspirin EC 81 MG tablet Take 81 mg by mouth daily.    Marland Kitchen atorvastatin (LIPITOR) 40 MG tablet Take 40 mg by mouth daily.     . bimatoprost (LUMIGAN) 0.01 % SOLN Place 1 drop into both eyes at bedtime.    . brimonidine-timolol (COMBIGAN) 0.2-0.5 % ophthalmic solution Place 1 drop into both eyes every 12 (twelve) hours.    .  brinzolamide (AZOPT) 1 % ophthalmic suspension 1 drop 3 (three) times daily.    . Calcium Carbonate-Vitamin D (CALTRATE 600+D PO) Take 1 tablet by mouth daily.     . cloNIDine (CATAPRES) 0.1 MG tablet Take 0.1 mg by mouth 2 (two) times daily.     Marland Kitchen gabapentin (NEURONTIN) 400 MG capsule Take 400 mg by mouth 3 (three) times daily.    Marland Kitchen glimepiride (AMARYL) 2 MG tablet Take 2 mg by mouth daily.     . hepatitis A vaccine (VAQTA) 50 UNIT/ML injection Inject 50 Units into the muscle once.    . hydrochlorothiazide (HYDRODIURIL) 25 MG tablet Take 25 mg by mouth daily.    Marland Kitchen HYDROcodone-acetaminophen (NORCO/VICODIN) 5-325 MG tablet Take 1 tablet by mouth every 6 (six) hours as needed for moderate pain. 15 tablet 0  . metFORMIN (GLUCOPHAGE) 500 MG tablet Take 1,000 mg by  mouth 2 (two) times daily with a meal.     . Multiple Vitamins-Minerals (CENTRUM ADULTS PO) Take 1 tablet by mouth daily.      No current facility-administered medications for this visit.    OBJECTIVE:   Vitals:   06/08/20 1153  BP: (!) 160/85  Pulse: (!) 101  Resp: 18  Temp: 98.5 F (36.9 C)  SpO2: 99%   Wt Readings from Last 3 Encounters:  06/08/20 138 lb 1.6 oz (62.6 kg)  09/14/19 146 lb 14.4 oz (66.6 kg)  07/17/19 142 lb 3.2 oz (64.5 kg)   Body mass index is 26.09 kg/m.    ECOG FS: GENERAL: Patient is a well appearing female in no acute distress HEENT:  Sclerae anicteric. Mask in place.  Neck is supple.  NODES:  No cervical, supraclavicular, or axillary lymphadenopathy palpated.  BREAST EXAM:  Right breast s/p mastectomy, left breast s/p lumpectomy, no sign of local recurrence. LUNGS:  Clear to auscultation bilaterally.  No wheezes or rhonchi. HEART:  Regular rate and rhythm. No murmur appreciated. ABDOMEN:  Soft, nontender.  Positive, normoactive bowel sounds. No organomegaly palpated. MSK:  No focal spinal tenderness to palpation.  EXTREMITIES:  No peripheral edema.   SKIN:  Clear with no obvious rashes or skin  changes. No nail dyscrasia. NEURO:  Nonfocal. Well oriented.  Appropriate affect.    LAB RESULTS:  CMP     Component Value Date/Time   NA 140 07/15/2019 1200   K 3.9 07/15/2019 1200   CL 107 07/15/2019 1200   CO2 24 07/15/2019 1200   GLUCOSE 237 (H) 07/15/2019 1200   BUN 12 07/15/2019 1200   CREATININE 0.83 07/15/2019 1200   CALCIUM 10.3 07/15/2019 1200   PROT 6.9 03/23/2010 1155   ALBUMIN 4.1 03/23/2010 1155   AST 19 03/23/2010 1155   ALT 19 03/23/2010 1155   ALKPHOS 72 03/23/2010 1155   BILITOT 0.6 03/23/2010 1155   GFRNONAA >60 07/15/2019 1200   GFRAA >60 07/15/2019 1200    No results found for: TOTALPROTELP, ALBUMINELP, A1GS, A2GS, BETS, BETA2SER, GAMS, MSPIKE, SPEI  No results found for: KPAFRELGTCHN, LAMBDASER, KAPLAMBRATIO  Lab Results  Component Value Date   WBC 6.6 08/13/2016   NEUTROABS 4.0 03/23/2010   HGB 12.3 08/13/2016   HCT 37.0 08/13/2016   MCV 85.8 08/13/2016   PLT 332 08/13/2016   No results found for: LABCA2  No components found for: LABCAN125  No results for input(s): INR in the last 168 hours.  No results found for: LABCA2  No results found for: CAN199  No results found for: CAN125  No results found for: CAN153  No results found for: CA2729  No components found for: HGQUANT  No results found for: CEA1 / No results found for: CEA1   No results found for: AFPTUMOR  No results found for: CHROMOGRNA  No results found for: HGBA, HGBA2QUANT, HGBFQUANT, HGBSQUAN (Hemoglobinopathy evaluation)   No results found for: LDH  No results found for: IRON, TIBC, IRONPCTSAT (Iron and TIBC)  No results found for: FERRITIN  Urinalysis    Component Value Date/Time   COLORURINE YELLOW 03/23/2010 1156   APPEARANCEUR CLEAR 03/23/2010 1156   LABSPEC 1.011 03/23/2010 1156   PHURINE 7.0 03/23/2010 1156   GLUCOSEU NEGATIVE 03/23/2010 1156   HGBUR NEGATIVE 03/23/2010 1156   BILIRUBINUR NEGATIVE 03/23/2010 1156   KETONESUR NEGATIVE  03/23/2010 1156   PROTEINUR NEGATIVE 03/23/2010 1156   UROBILINOGEN 0.2 03/23/2010 1156   NITRITE NEGATIVE 03/23/2010 1156     LEUKOCYTESUR SMALL (A) 03/23/2010 1156    STUDIES: No results found.   ELIGIBLE FOR AVAILABLE RESEARCH PROTOCOL: no  ASSESSMENT: 85 y.o. [place] woman  (1) status post right mastectomy 1999  (2) status post left breast biopsy 03/04/2019 for a clinical 2.9 cm noninvasive breast cancer (ductal carcinoma in situ), grade 3, estrogen receptor positive, progesterone receptor negative  (3) tamoxifen started March 2020  (4) left lumpectomy 07/17/2019 showed ductal carcinoma in situ measuring 2.7 cm, with close but negative margins  (5) genetics testing   (a) EPCAM VUS but otherwise negative genetic testing on the common hereditary cancer panel.  The Common Hereditary Gene Panel offered by [facility] includes sequencing and/or deletion duplication testing of the following 48 genes: APC, ATM, AXIN2, BARD1, BMPR1A, BRCA1, BRCA2, BRIP1, CDH1, CDK4, CDKN2A (p14ARF), CDKN2A (p16INK4a), CHEK2, CTNNA1, DICER1, EPCAM (Deletion/duplication testing only), GREM1 (promoter region deletion/duplication testing only), KIT, MEN1, MLH1, MSH2, MSH3, MSH6, MUTYH, NBN, NF1, NHTL1, PALB2, PDGFRA, PMS2, POLD1, POLE, PTEN, RAD50, RAD51C, RAD51D, RNF43, SDHB, SDHC, SDHD, SMAD4, SMARCA4. STK11, TP53, TSC1, TSC2, and VHL.  The following genes were evaluated for sequence changes only: SDHA and HOXB13 c.251G>A variant only. The report date is 09/25/2019.   PLAN:  Amanda Green is doing well today.  She has no clinical or radiographic sign of breast cancer recurrence.  She believes she is taking Tamoxifen daily.  This medication is not on her list.  I asked that she double check at home and let us know to be sure.    She was recommended to continue healthy diet and exercise.  She will continue with annual mammograms, and I recommended she stay up to date with seeing her PCP for her general health care needs.     We will see Amanda Green back in 6 months for f/u with Dr. Jana Hakim for f/u.  She knows to call for any questions that may arise between now and her next appointment.  We are happy to see her sooner if needed.   Total encounter time: 20 minutes*  Wilber Bihari, NP 06/10/20 7:57 PM Medical Oncology and Hematology [facility] [place], [place] [address] Tel. [phone]    Fax. [phone]    *Total Encounter Time as defined by the Centers for Medicare and Medicaid Services includes, in addition to the face-to-face time of a patient visit (documented in the note above) non-face-to-face time: obtaining and reviewing outside history, ordering and reviewing medications, tests or procedures, care coordination (communications with other health care professionals or caregivers) and documentation in the medical record.

## 2020-06-10 ENCOUNTER — Encounter: Payer: Self-pay | Admitting: Adult Health

## 2020-06-13 ENCOUNTER — Telehealth: Payer: Self-pay | Admitting: Oncology

## 2020-06-13 NOTE — Telephone Encounter (Signed)
Scheduled per 6/25 sch message. Unable to contact pt. Left voicemail- appt added on 12/23.

## 2020-08-26 DIAGNOSIS — E7800 Pure hypercholesterolemia, unspecified: Secondary | ICD-10-CM | POA: Diagnosis not present

## 2020-08-26 DIAGNOSIS — M792 Neuralgia and neuritis, unspecified: Secondary | ICD-10-CM | POA: Diagnosis not present

## 2020-08-26 DIAGNOSIS — I10 Essential (primary) hypertension: Secondary | ICD-10-CM | POA: Diagnosis not present

## 2020-08-26 DIAGNOSIS — E1169 Type 2 diabetes mellitus with other specified complication: Secondary | ICD-10-CM | POA: Diagnosis not present

## 2020-08-26 DIAGNOSIS — R413 Other amnesia: Secondary | ICD-10-CM | POA: Diagnosis not present

## 2020-08-26 DIAGNOSIS — Z23 Encounter for immunization: Secondary | ICD-10-CM | POA: Diagnosis not present

## 2020-11-25 DIAGNOSIS — Z853 Personal history of malignant neoplasm of breast: Secondary | ICD-10-CM | POA: Diagnosis not present

## 2020-12-08 ENCOUNTER — Ambulatory Visit: Payer: Medicare PPO | Admitting: Oncology

## 2020-12-25 NOTE — Progress Notes (Signed)
[place]  [phone] [phone]     ID: Amanda Green DOB: Jan 13, 1935  MR#: [id number]  ERX#:[id number]  Patient Care Team: Alroy Dust, Carlean Green.[name], MD as PCP - General (Family Medicine) Johnathan Hausen, MD as Consulting Physician (General Surgery) Dondre Catalfamo, Virgie Dad, MD as Consulting Physician (Oncology) Gery Pray, MD as Consulting Physician (Radiation Oncology) Chauncey Cruel, MD OTHER MD:  CHIEF COMPLAINT: Estrogen receptor positive ductal carcinoma in situ (s/p right mastectomy)  CURRENT TREATMENT: Tamoxifen    INTERVAL HISTORY: Amanda Green returns today for follow up of her noninvasive breast cancer accompanied by her daughter.   She supposedly continues on tamoxifen except that I do not see it on her medication list and she does not know whether she is taking it or whether she is taking it regularly.  She was also found to be quite hypertensive today.  She is on clonidine, amlodipine, and hydrochlorothiazide, and she does not think she took any of those today.  Her most recent left diagnostic mammography at [facility] on 02/26/2020 showed: breast density category B; no evidence of malignancy in either breast.    REVIEW OF SYSTEMS: [facility].  She enjoyed the holidays with family.  She "does not do much" otherwise.  She denies unusual headaches visual changes nausea vomiting dizziness gait imbalance or falls.  Detailed review of systems was otherwise stable  COVID 19 VACCINATION STATUS:    HISTORY OF CURRENT ILLNESS: From the original intake note:  Amanda Green had routine left screening mammography on 02/24/2019 showing a possible abnormality. Of note, she has a history of right mastectomy in 1999 under Dr. Hassell Done.   She underwent left diagnostic mammography at [facility] on 03/04/2019 showing: breast density category B; indeterminate calcifications in the upper-outer  left breast measuring 2.9 cm.  Accordingly on 03/04/2019 she proceeded to biopsy of the left breast area in question. The pathology from this procedure ([id number]) showed: ductal carcinoma in situ with calcifications, grade 3. Prognostic indicators significant for: estrogen receptor, 90% positive with strong staining intensity and progesterone receptor, 0% negative.  She was started on tamoxifen shortly after by Dr. Hassell Done due to surgery delay for pandemic concerns.  She is scheduled for definitive left breast surgery 07/17/2019  The patient's subsequent history is as detailed below.   PAST MEDICAL HISTORY: Past Medical History:  Diagnosis Date  . Arthritis   . Breast cancer ([place]) 1999   right mastectomy  . Breast cancer, left ([place]) 05/2019   left lumpectomy  . Diabetes mellitus   . Dislocation of left shoulder joint   . Family history of prostate cancer   . Hyperlipidemia   . Hypertension   . Numbness and tingling of right lower extremity   . Stroke ([place])    light stroke - 2012 ,right leg nerve damage     PAST SURGICAL HISTORY: Past Surgical History:  Procedure Laterality Date  . ABDOMINAL HYSTERECTOMY    . APPENDECTOMY    . BREAST BIOPSY Left 01/19/2010  . BREAST LUMPECTOMY WITH RADIOACTIVE SEED LOCALIZATION Left 07/17/2019   Procedure: LEFT BREAST LUMPECTOMY WITH RADIOACTIVE SEED LOCALIZATION;  Surgeon: Johnathan Hausen, MD;  Location: [place];  Service: General;  Laterality: Left;  . BREAST SURGERY     Right mastectomy 1999  . CARPAL TUNNEL RELEASE Right 09/30/2014   Procedure: RIGHT CARPAL TUNNEL RELEASE;  Surgeon: Daryll Brod, MD;  Location: [place];  Service: Orthopedics;  Laterality: Right;  . COLONOSCOPY    . EYE SURGERY     cataracts  . JOINT REPLACEMENT     lt total knee 2011  . LIPOMA EXCISION  11/28/2011   Procedure: EXCISION LIPOMA;  Surgeon: Pedro Earls, MD;  Location: [place];  Service: General;   Laterality: N/A;  excisino lipoma 4 cm back of neck  . MASTECTOMY Right 1999    FAMILY HISTORY: Family History  Problem Relation Age of Onset  . Esophageal cancer Mother        smoker  . Other Father        Unsure of medical history.  . Prostate cancer Brother        d. 80s  . Cancer Son        ?? unsure, but may have had cancer  . Cancer Son        ?? unsure, but may have had cancer  Patient's father died from lung cancer. Patient's mother died from esophageal cancer. The patient denies a family hx of breast or ovarian cancer. She has 1 brother. He was diagnosed with prostate cancer.   GYNECOLOGIC HISTORY:  No LMP recorded. Patient is postmenopausal. Menarche: 86 years old Age at first live birth: 86 years old [place] P 5 LMP age 76 Hysterectomy? Yes, age 55, uterus only BSO? No Hormone replacement?   SOCIAL HISTORY: (updated 06/10/2019)  Amanda Green is originally from [place].  She is currently retired. She is widowed. She lives at home with 1 of her granddaughteris, who is 36 and works at the [employer].  Another of her granddaughters [name] works here in our center.    ADVANCED DIRECTIVES: not in place; she plans to name her daughter(Belinda as her healthcare power of attorney.  At the 09/14/2019 visit she was given the appropriate forms to complete and notarize at her discretion   HEALTH MAINTENANCE: Social History   Tobacco Use  . Smoking status: Never Smoker  . Smokeless tobacco: Never Used  Substance Use Topics  . Alcohol use: No  . Drug use: No     Colonoscopy: 03/2011, Dr. Penelope Coop, benign polyp  PAP: not on file, remote abdominal hysterectomy  Bone density: scheduled for 06/22/2019   No Known Allergies  Current Outpatient Medications  Medication Sig Dispense Refill  . amLODipine (NORVASC) 10 MG tablet Take 10 mg by mouth daily.     Marland Kitchen aspirin EC 81 MG tablet Take 81 mg by mouth daily.    Marland Kitchen atorvastatin (LIPITOR) 40 MG tablet Take 40 mg by mouth daily.     .  bimatoprost (LUMIGAN) 0.01 % SOLN Place 1 drop into both eyes at bedtime.    . brimonidine-timolol (COMBIGAN) 0.2-0.5 % ophthalmic solution Place 1 drop into both eyes every 12 (twelve) hours.    . brinzolamide (AZOPT) 1 % ophthalmic suspension 1 drop 3 (three) times daily.    . Calcium Carbonate-Vitamin D (CALTRATE 600+D PO) Take 1 tablet by mouth daily.     . cloNIDine (CATAPRES) 0.1 MG tablet Take 0.1 mg by mouth 2 (two) times daily.     Marland Kitchen gabapentin (NEURONTIN) 400 MG capsule Take 400 mg by mouth 3 (three) times daily.    Marland Kitchen glimepiride (AMARYL) 2 MG tablet Take 2 mg by mouth daily.     . hepatitis A vaccine (VAQTA) 50 UNIT/ML injection Inject 50 Units into the muscle once.    . hydrochlorothiazide (HYDRODIURIL) 25 MG tablet Take 25 mg by  mouth daily.    Marland Kitchen HYDROcodone-acetaminophen (NORCO/VICODIN) 5-325 MG tablet Take 1 tablet by mouth every 6 (six) hours as needed for moderate pain. 15 tablet 0  . metFORMIN (GLUCOPHAGE) 500 MG tablet Take 1,000 mg by mouth 2 (two) times daily with a meal.     . Multiple Vitamins-Minerals (CENTRUM ADULTS PO) Take 1 tablet by mouth daily.      No current facility-administered medications for this visit.    OBJECTIVE: African-American woman who appears stated age  51:   12/26/20 1516  BP: (!) 170/93  Pulse: 85  Resp: 18  Temp: (!) 97 F (36.1 C)  SpO2: 99%   Wt Readings from Last 3 Encounters:  12/26/20 138 lb (62.6 kg)  06/08/20 138 lb 1.6 oz (62.6 kg)  09/14/19 146 lb 14.4 oz (66.6 kg)   Body mass index is 26.07 kg/m.    ECOG FS:  Sclerae unicteric, EOMs intact Wearing a mask No cervical or supraclavicular adenopathy Lungs no rales or rhonchi Heart regular rate and rhythm Abd soft, nontender, positive bowel sounds MSK no focal spinal tenderness, no upper extremity lymphedema Neuro: nonfocal, well oriented, appropriate affect Breasts: The right breast is status post mastectomy.  There is no evidence of local recurrence.  The left  breast is status post lumpectomy.  There is no sign of breast recurrence.   LAB RESULTS:  CMP     Component Value Date/Time   NA 140 07/15/2019 1200   K 3.9 07/15/2019 1200   CL 107 07/15/2019 1200   CO2 24 07/15/2019 1200   GLUCOSE 237 (H) 07/15/2019 1200   BUN 12 07/15/2019 1200   CREATININE 0.83 07/15/2019 1200   CALCIUM 10.3 07/15/2019 1200   PROT 6.9 03/23/2010 1155   ALBUMIN 4.1 03/23/2010 1155   AST 19 03/23/2010 1155   ALT 19 03/23/2010 1155   ALKPHOS 72 03/23/2010 1155   BILITOT 0.6 03/23/2010 1155   GFRNONAA >60 07/15/2019 1200   GFRAA >60 07/15/2019 1200    No results found for: TOTALPROTELP, ALBUMINELP, A1GS, A2GS, BETS, BETA2SER, GAMS, MSPIKE, SPEI  No results found for: KPAFRELGTCHN, LAMBDASER, KAPLAMBRATIO  Lab Results  Component Value Date   WBC 6.6 08/13/2016   NEUTROABS 4.0 03/23/2010   HGB 12.3 08/13/2016   HCT 37.0 08/13/2016   MCV 85.8 08/13/2016   PLT 332 08/13/2016   No results found for: LABCA2  No components found for: YOKHTX774  No results for input(s): INR in the last 168 hours.  No results found for: LABCA2  No results found for: FSE395  No results found for: VUY233  No results found for: IDH686  No results found for: CA2729  No components found for: HGQUANT  No results found for: CEA1 / No results found for: CEA1   No results found for: AFPTUMOR  No results found for: CHROMOGRNA  No results found for: HGBA, HGBA2QUANT, HGBFQUANT, HGBSQUAN (Hemoglobinopathy evaluation)   No results found for: LDH  No results found for: IRON, TIBC, IRONPCTSAT (Iron and TIBC)  No results found for: FERRITIN  Urinalysis    Component Value Date/Time   COLORURINE YELLOW 03/23/2010 [place] 03/23/2010 1156   LABSPEC 1.011 03/23/2010 1156   PHURINE 7.0 03/23/2010 [place] 03/23/2010 [place] 03/23/2010 [place] 03/23/2010 1156   KETONESUR NEGATIVE 03/23/2010 1156    PROTEINUR NEGATIVE 03/23/2010 1156   UROBILINOGEN 0.2 03/23/2010 1156   NITRITE NEGATIVE 03/23/2010 1156   LEUKOCYTESUR  SMALL (A) 03/23/2010 1156    STUDIES: No results found.   ELIGIBLE FOR AVAILABLE RESEARCH PROTOCOL: no  ASSESSMENT: 86 y.o. Amanda Green woman  (1) status post right mastectomy 1999  (2) status post left breast biopsy 03/04/2019 for a clinical 2.9 cm noninvasive breast cancer (ductal carcinoma in situ), grade 3, estrogen receptor positive, progesterone receptor negative  (3) tamoxifen started March 2020  (4) left lumpectomy 07/17/2019 showed ductal carcinoma in situ measuring 2.7 cm, with close but negative margins  (5) genetics testing 09/25/2019 through the Common Hereditary Gene Panel offered by [facility] found no deleterious mutations in APC, ATM, AXIN2, BARD1, BMPR1A, BRCA1, BRCA2, BRIP1, CDH1, CDK4, CDKN2A (p14ARF), CDKN2A (p16INK4a), CHEK2, CTNNA1, DICER1, EPCAM (Deletion/duplication testing only), GREM1 (promoter region deletion/duplication testing only), KIT, MEN1, MLH1, MSH2, MSH3, MSH6, MUTYH, NBN, NF1, NHTL1, PALB2, PDGFRA, PMS2, POLD1, POLE, PTEN, RAD50, RAD51C, RAD51D, RNF43, SDHB, SDHC, SDHD, SMAD4, SMARCA4. STK11, TP53, TSC1, TSC2, and VHL.  The following genes were evaluated for sequence changes only: SDHA and HOXB13 c.251G>A variant only. The report date is 09/25/2019.  (a)  EPCAM VUS noted PLAN: Amanda Green is 1-1/2 years out from definitive surgery for her left breast cancer with no evidence of disease recurrence.  This is very favorable.  I went over the tamoxifen issue with her again.  My recommendation especially since she did not receive radiation is that she take tamoxifen for a total of 5 years which will take [place] through February 2025  She tells me she did not take her blood pressure medicine today and that certainly is consistent with the blood pressure we documented here.  I urged her to take her blood pressure immediately on getting home.  I urged her  daughter to help her fill Amanda Green's pillbox every week to make sure she knows what she is supposed to take and takes it as she is supposed to take it  Otherwise she will see [place] again in September after her August mammogram.  We will start seeing her on a yearly basis after that.  Total encounter time 25 minutes.Amanda Green C. Rakiyah Esch, MD 12/26/20 3:27 PM Medical Oncology and Hematology [facility] [place], [place] [address] Tel. [phone]    Fax. [phone]   I, Wilburn Mylar, am acting as scribe for Dr. Virgie Dad. Amanda Green.  I, Lurline Del MD, have reviewed the above documentation for accuracy and completeness, and I agree with the above.    *Total Encounter Time as defined by the Centers for Medicare and Medicaid Services includes, in addition to the face-to-face time of a patient visit (documented in the note above) non-face-to-face time: obtaining and reviewing outside history, ordering and reviewing medications, tests or procedures, care coordination (communications with other health care professionals or caregivers) and documentation in the medical record.

## 2020-12-26 ENCOUNTER — Inpatient Hospital Stay: Payer: Medicare PPO | Attending: Oncology | Admitting: Oncology

## 2020-12-26 ENCOUNTER — Other Ambulatory Visit: Payer: Self-pay

## 2020-12-26 VITALS — BP 170/93 | HR 85 | Temp 97.0°F | Resp 18 | Ht 61.0 in | Wt 138.0 lb

## 2020-12-26 DIAGNOSIS — Z800 Family history of malignant neoplasm of digestive organs: Secondary | ICD-10-CM | POA: Diagnosis not present

## 2020-12-26 DIAGNOSIS — D0512 Intraductal carcinoma in situ of left breast: Secondary | ICD-10-CM | POA: Insufficient documentation

## 2020-12-26 DIAGNOSIS — I10 Essential (primary) hypertension: Secondary | ICD-10-CM | POA: Insufficient documentation

## 2020-12-26 DIAGNOSIS — Z96652 Presence of left artificial knee joint: Secondary | ICD-10-CM | POA: Diagnosis not present

## 2020-12-26 DIAGNOSIS — Z170 Estrogen receptor positive status [ER+]: Secondary | ICD-10-CM | POA: Diagnosis not present

## 2020-12-26 DIAGNOSIS — Z9011 Acquired absence of right breast and nipple: Secondary | ICD-10-CM | POA: Diagnosis not present

## 2020-12-26 DIAGNOSIS — Z801 Family history of malignant neoplasm of trachea, bronchus and lung: Secondary | ICD-10-CM | POA: Diagnosis not present

## 2020-12-26 DIAGNOSIS — Z79811 Long term (current) use of aromatase inhibitors: Secondary | ICD-10-CM | POA: Insufficient documentation

## 2020-12-26 DIAGNOSIS — Z90710 Acquired absence of both cervix and uterus: Secondary | ICD-10-CM | POA: Insufficient documentation

## 2020-12-26 DIAGNOSIS — Z809 Family history of malignant neoplasm, unspecified: Secondary | ICD-10-CM | POA: Insufficient documentation

## 2020-12-26 MED ORDER — TAMOXIFEN CITRATE 20 MG PO TABS: 20.0000 mg | ORAL_TABLET | Freq: Every day | ORAL | 12 refills | Status: AC

## 2020-12-28 ENCOUNTER — Telehealth: Payer: Self-pay | Admitting: Oncology

## 2020-12-28 NOTE — Telephone Encounter (Signed)
Scheduled appts per 1/10 los. Pt's daughter confirmed appt date and time.  

## 2021-02-07 ENCOUNTER — Other Ambulatory Visit: Payer: Self-pay | Admitting: Neurology

## 2021-02-07 ENCOUNTER — Other Ambulatory Visit: Payer: Self-pay

## 2021-02-07 ENCOUNTER — Ambulatory Visit: Payer: Medicare PPO | Admitting: Neurology

## 2021-02-07 ENCOUNTER — Encounter: Payer: Self-pay | Admitting: Neurology

## 2021-02-07 VITALS — BP 134/89 | HR 67 | Ht 61.0 in | Wt 139.0 lb

## 2021-02-07 DIAGNOSIS — I639 Cerebral infarction, unspecified: Secondary | ICD-10-CM | POA: Diagnosis not present

## 2021-02-07 DIAGNOSIS — R4189 Other symptoms and signs involving cognitive functions and awareness: Secondary | ICD-10-CM | POA: Diagnosis not present

## 2021-02-07 HISTORY — DX: Cerebral infarction, unspecified: I63.9

## 2021-02-07 MED ORDER — PREGABALIN 50 MG PO CAPS: 50.0000 mg | ORAL_CAPSULE | Freq: Three times a day (TID) | ORAL | 5 refills | Status: DC

## 2021-02-07 NOTE — Telephone Encounter (Signed)
I returned the call and spoke to her daughter, [name]. She is aware that the prescription will be sent in today by Dr. Krista Blue. She just has not had an opportunity to dictate the note yet and send in the rx. She expressed understanding.
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
## 2021-02-07 NOTE — Addendum Note (Signed)
Addended by: Desmond Lope on: 02/07/2021 02:57 PM   Modules accepted: Orders

## 2021-02-07 NOTE — Telephone Encounter (Signed)
Pt states they were supposed to have Lyrica prescribed to them today but do not see it in their paperwork. They need medication today as they are leaving town tomorrow. Please advise.

## 2021-02-07 NOTE — Telephone Encounter (Signed)
Per vo by Dr. Krista Blue, she discussed her starting aspirin 81mg , one tablet daily. She also ordered MRI of brain. The patient had complained of excessive drowsiness previously with gabapentin. Dr. Krista Blue said that pregabalin may cause similar side effects. However, if she would like to try it then she will authorize a prescription for pregabalin 50mg , one capsule TID.   I called her daughter and she is aware of this information. States her mother would like to try the pregabalin to see if it will help with her right-sided paresthesia. Says they will keep a close watch on her for any adverse side effects and call us back with any concerns.

## 2021-02-08 ENCOUNTER — Telehealth: Payer: Self-pay | Admitting: Neurology

## 2021-02-08 LAB — CBC WITH DIFFERENTIAL
Basophils Absolute: 0.1 10*3/uL (ref 0.0–0.2)
Basos: 1 %
EOS (ABSOLUTE): 0.3 10*3/uL (ref 0.0–0.4)
Eos: 5 %
Hematocrit: 38 % (ref 34.0–46.6)
Hemoglobin: 12.3 g/dL (ref 11.1–15.9)
Immature Grans (Abs): 0 10*3/uL (ref 0.0–0.1)
Immature Granulocytes: 0 %
Lymphocytes Absolute: 1.3 10*3/uL (ref 0.7–3.1)
Lymphs: 20 %
MCH: 28.4 pg (ref 26.6–33.0)
MCHC: 32.4 g/dL (ref 31.5–35.7)
MCV: 88 fL (ref 79–97)
Monocytes Absolute: 0.4 10*3/uL (ref 0.1–0.9)
Monocytes: 6 %
Neutrophils Absolute: 4.4 10*3/uL (ref 1.4–7.0)
Neutrophils: 68 %
RBC: 4.33 x10E6/uL (ref 3.77–5.28)
RDW: 13 % (ref 11.7–15.4)
WBC: 6.5 10*3/uL (ref 3.4–10.8)

## 2021-02-08 LAB — COMPREHENSIVE METABOLIC PANEL
ALT: 10 IU/L (ref 0–32)
AST: 17 IU/L (ref 0–40)
Albumin/Globulin Ratio: 1.5 (ref 1.2–2.2)
Albumin: 3.9 g/dL (ref 3.6–4.6)
Alkaline Phosphatase: 68 IU/L (ref 44–121)
BUN/Creatinine Ratio: 20 (ref 12–28)
BUN: 17 mg/dL (ref 8–27)
Bilirubin Total: 0.4 mg/dL (ref 0.0–1.2)
CO2: 25 mmol/L (ref 20–29)
Calcium: 10.4 mg/dL — ABNORMAL HIGH (ref 8.7–10.3)
Chloride: 103 mmol/L (ref 96–106)
Creatinine, Ser: 0.86 mg/dL (ref 0.57–1.00)
GFR calc Af Amer: 72 mL/min/{1.73_m2} (ref >59)
GFR calc non Af Amer: 62 mL/min/{1.73_m2} (ref >59)
Globulin, Total: 2.6 g/dL (ref 1.5–4.5)
Glucose: 191 mg/dL — ABNORMAL HIGH (ref 65–99)
Potassium: 4.9 mmol/L (ref 3.5–5.2)
Sodium: 139 mmol/L (ref 134–144)
Total Protein: 6.5 g/dL (ref 6.0–8.5)

## 2021-02-08 LAB — RPR: RPR Ser Ql: NONREACTIVE

## 2021-02-08 LAB — VITAMIN B12: Vitamin B-12: 757 pg/mL (ref 232–1245)

## 2021-02-08 LAB — TSH: TSH: 2.56 u[IU]/mL (ref 0.450–4.500)

## 2021-02-08 NOTE — Telephone Encounter (Signed)
I spoke to her daughter, [name] (on [place]). She verbalized understanding of the lab results and will have her further monitored by PCP.

## 2021-02-08 NOTE — Telephone Encounter (Signed)
Mcarthur Rossetti Josem Kaufmann: [id number] (exp. 02/08/21 to 03/10/21) order sent to GI. They will reach out to the patient to schedule.

## 2021-02-08 NOTE — Telephone Encounter (Signed)
Please call patient, laboratory evaluation showed elevated glucose 191, this likely related to the timing of the blood sample and her last meal, slight elevated calcium, is about her baseline level  Rest of the laboratory evaluation showed no significant abnormality  I have forwarded the laboratory to her primary care physician Alroy Dust, L.[name], MD

## 2021-02-13 ENCOUNTER — Other Ambulatory Visit: Payer: Self-pay | Admitting: Oncology

## 2021-02-13 DIAGNOSIS — Z9889 Other specified postprocedural states: Secondary | ICD-10-CM

## 2021-02-20 ENCOUNTER — Ambulatory Visit
Admission: RE | Admit: 2021-02-20 | Discharge: 2021-02-20 | Disposition: A | Discharge status: Home / Self Care | Payer: Medicare PPO | Source: Ambulatory Visit | Attending: Neurology | Admitting: Neurology

## 2021-02-20 DIAGNOSIS — I639 Cerebral infarction, unspecified: Secondary | ICD-10-CM

## 2021-02-20 DIAGNOSIS — R4189 Other symptoms and signs involving cognitive functions and awareness: Secondary | ICD-10-CM | POA: Diagnosis not present

## 2021-02-22 ENCOUNTER — Telehealth: Payer: Self-pay | Admitting: Neurology

## 2021-02-22 NOTE — Telephone Encounter (Signed)
Left message on her daughter's voicemail requesting a return call (Belinda Hamphall on DPR).

## 2021-02-22 NOTE — Telephone Encounter (Signed)
I was able to speak to her daughter and she verbalized understanding of the MRI brain results below.

## 2021-02-22 NOTE — Telephone Encounter (Signed)
   MRI brain (without) demonstrating: - Moderate chronic small vessel ischemic disease; slight increase since 09/15/09.  - Chronic left thalamic ischemic infarct.  - No acute findings.  Please call patient MRI of the brain showed moderate supratentorium small vessel disease, lacunar infarction involving left thalamus  There was no acute abnormalities

## 2021-02-24 DIAGNOSIS — E1169 Type 2 diabetes mellitus with other specified complication: Secondary | ICD-10-CM | POA: Diagnosis not present

## 2021-02-24 DIAGNOSIS — M792 Neuralgia and neuritis, unspecified: Secondary | ICD-10-CM | POA: Diagnosis not present

## 2021-02-24 DIAGNOSIS — Z0000 Encounter for general adult medical examination without abnormal findings: Secondary | ICD-10-CM | POA: Diagnosis not present

## 2021-02-24 DIAGNOSIS — M8588 Other specified disorders of bone density and structure, other site: Secondary | ICD-10-CM | POA: Diagnosis not present

## 2021-02-24 DIAGNOSIS — I10 Essential (primary) hypertension: Secondary | ICD-10-CM | POA: Diagnosis not present

## 2021-02-24 DIAGNOSIS — E7800 Pure hypercholesterolemia, unspecified: Secondary | ICD-10-CM | POA: Diagnosis not present

## 2021-03-08 ENCOUNTER — Encounter: Payer: Self-pay | Admitting: Podiatry

## 2021-03-08 ENCOUNTER — Ambulatory Visit: Payer: Medicare PPO | Admitting: Podiatry

## 2021-03-08 ENCOUNTER — Other Ambulatory Visit: Payer: Self-pay

## 2021-03-08 ENCOUNTER — Ambulatory Visit (INDEPENDENT_AMBULATORY_CARE_PROVIDER_SITE_OTHER): Payer: Medicare PPO

## 2021-03-08 DIAGNOSIS — M79675 Pain in left toe(s): Secondary | ICD-10-CM | POA: Diagnosis not present

## 2021-03-08 DIAGNOSIS — L84 Corns and callosities: Secondary | ICD-10-CM

## 2021-03-08 DIAGNOSIS — M79674 Pain in right toe(s): Secondary | ICD-10-CM | POA: Diagnosis not present

## 2021-03-08 DIAGNOSIS — B351 Tinea unguium: Secondary | ICD-10-CM | POA: Diagnosis not present

## 2021-03-08 DIAGNOSIS — E1151 Type 2 diabetes mellitus with diabetic peripheral angiopathy without gangrene: Secondary | ICD-10-CM

## 2021-03-08 DIAGNOSIS — M7989 Other specified soft tissue disorders: Secondary | ICD-10-CM

## 2021-03-12 NOTE — Progress Notes (Signed)
Subjective:  Patient ID: Amanda Green, female    DOB: 02-18-1935,  MRN: [id number]  86 y.o. female presents with preventative diabetic foot care and callus(es) left foot and painful thick toenails that are difficult to trim. Painful toenails interfere with ambulation. Aggravating factors include wearing enclosed shoe gear. Pain is relieved with periodic professional debridement. Painful calluses are aggravated when weightbearing with and without shoegear. Pain is relieved with periodic professional debridement.Marland Kitchen    PCP: Alroy Dust, L.[name], MD and last visit was: 02/24/2021.  Review of Systems: Negative except as noted in the HPI.   Allergies  Allergen Reactions  . Gabapentin     Other reaction(s): drowsiness    Objective:  There were no vitals filed for this visit. Constitutional Patient is a pleasant 86 y.o. African American female WD, WN in NAD. AAO x 3.  Vascular Capillary fill time to digits <3 seconds b/l lower extremities. Palpable DP pulse(s) b/l lower extremities Nonpalpable PT pulse(s) b/l lower extremities. Pedal hair absent. Lower extremity skin temperature gradient within normal limits. No pain with calf compression b/l. No edema noted b/l lower extremities. No cyanosis or clubbing noted.  Neurologic Normal speech. Protective sensation intact 5/5 intact bilaterally with 10g monofilament b/l.  Dermatologic Pedal skin with normal turgor, texture and tone bilaterally. No open wounds bilaterally. No interdigital macerations bilaterally. Toenails 1-5 b/l elongated, discolored, dystrophic, thickened, crumbly with subungual debris and tenderness to dorsal palpation. Hyperkeratotic lesion(s) L hallux, submet head 5 left foot, sub 5th met base left foot and plantar aspect of heel left foot.  No erythema, no edema, no drainage, no fluctuance.  Orthopedic: Normal muscle strength 5/5 to all lower extremity muscle groups bilaterally. No pain crepitus or joint limitation noted with ROM b/l.  No gross bony deformities bilaterally. Palpable soft tissue masses noted under calluses above. Appear to be soft and fixed consistent with lipomatous lesions. No surrounding erythema, no edema, no drainage, no fluctuance, no abnormal temperature changes with lesions.    Xray findings left foot: No gas in tissues left foot. No evidence of fracture left foot. No bone erosion nor bone sclerosis  noted at location of soft tissue masses. Assessment:   1. Mass of soft tissue of foot   2. Pain due to onychomycosis of toenails of both feet   3. Callus   4. Type II diabetes mellitus with peripheral circulatory disorder [facility])    Plan:  Patient was evaluated and treated and all questions answered.  Onychomycosis with pain -Nails palliatively debridement as below. -Educated on self-care  Procedure: Nail Debridement Rationale: Pain Type of Debridement: manual, sharp debridement. Instrumentation: Nail nipper, rotary burr. Number of Nails: 10  -Examined patient. -Patient to continue soft, supportive shoe gear daily. -Toenails 1-5 b/l were debrided in length and girth with sterile nail nippers and dremel without iatrogenic bleeding.  -Callus(es) L hallux, submet head 5 left foot, sub 5th met base left foot and plantar aspect of heel left foot pared utilizing sterile scalpel blade without complication or incident. Total number debrided =4. -Patient to report any pedal injuries to medical professional immediately. -Discussed soft tissue mass most likely lipomas. Xray of left foot was performed and reviewed with patient and/or POA. Advised if she wants further workup, we could order ultrasound or MRI. Patient states she is symptomatic and would just like to monitor for any changes for now.  -Patient/POA to call should there be question/concern in the interim.  Return in about 3 months (around 06/08/2021).  Marzetta Board,  DPM

## 2021-03-23 ENCOUNTER — Other Ambulatory Visit: Payer: Self-pay

## 2021-03-23 ENCOUNTER — Other Ambulatory Visit: Payer: Self-pay | Admitting: Oncology

## 2021-03-23 ENCOUNTER — Ambulatory Visit
Admission: RE | Admit: 2021-03-23 | Discharge: 2021-03-23 | Disposition: A | Discharge status: Home / Self Care | Payer: Medicare PPO | Source: Ambulatory Visit | Attending: Oncology | Admitting: Oncology

## 2021-03-23 DIAGNOSIS — R922 Inconclusive mammogram: Secondary | ICD-10-CM | POA: Diagnosis not present

## 2021-03-23 DIAGNOSIS — R921 Mammographic calcification found on diagnostic imaging of breast: Secondary | ICD-10-CM

## 2021-03-23 DIAGNOSIS — Z9889 Other specified postprocedural states: Secondary | ICD-10-CM

## 2021-03-30 ENCOUNTER — Ambulatory Visit
Admission: RE | Admit: 2021-03-30 | Discharge: 2021-03-30 | Disposition: A | Discharge status: Home / Self Care | Payer: Medicare PPO | Source: Ambulatory Visit | Attending: Oncology | Admitting: Oncology

## 2021-03-30 ENCOUNTER — Other Ambulatory Visit: Payer: Self-pay

## 2021-03-30 ENCOUNTER — Other Ambulatory Visit: Payer: Self-pay | Admitting: Oncology

## 2021-03-30 DIAGNOSIS — R921 Mammographic calcification found on diagnostic imaging of breast: Secondary | ICD-10-CM

## 2021-03-30 DIAGNOSIS — D0512 Intraductal carcinoma in situ of left breast: Secondary | ICD-10-CM | POA: Diagnosis not present

## 2021-03-31 ENCOUNTER — Telehealth: Payer: Self-pay | Admitting: Oncology

## 2021-03-31 NOTE — Telephone Encounter (Signed)
Scheduled appt per 4/15 sch msg. Called pt's daughter, no answer. Left msg with appt date and time.

## 2021-04-05 DIAGNOSIS — D0512 Intraductal carcinoma in situ of left breast: Secondary | ICD-10-CM | POA: Diagnosis not present

## 2021-04-05 NOTE — Progress Notes (Signed)
[place]  Telephone:([phone] Fax:(336) [phone]     ID: Amanda Green DOB: 05/22/1935  MR#: [id number]  QQP#:[id number]  Patient Care Team: Alroy Dust, Carlean Jews.[name], MD as PCP - General (Family Medicine) Johnathan Hausen, MD as Consulting Physician (General Surgery) Taquita Demby, Virgie Dad, MD as Consulting Physician (Oncology) Gery Pray, MD as Consulting Physician (Radiation Oncology) Chauncey Cruel, MD OTHER MD:  CHIEF COMPLAINT: Estrogen receptor positive ductal carcinoma in situ (s/p right mastectomy)  CURRENT TREATMENT: Tamoxifen   INTERVAL HISTORY: Amanda Green returns today for follow up of her noninvasive breast cancer accompanied by her daughter.   She supposedly continues on tamoxifen except that I do not see it on her medication list and she does not know whether she is taking it or whether she is taking it regularly.  Since her last visit, she underwent left diagnostic mammography with tomography at [facility] on 03/23/2021 showing: breast density category B; new suspicious calcifications within upper-outer left breast spanning at least 10 cm.  She proceeded to biopsies of the left breast calcifications on 03/30/2021. Pathology from the procedure ([id number]) revealed high grade ductal carcinoma in situ with necrosis and calcifications. Prognostic panel significant for: estrogen receptor 30% positive, progesterone receptor 2% positive, both with weak staining intensity.  She also underwent brain MRI on 02/20/2021 showing no acute findings.   REVIEW OF SYSTEMS: Amanda Green does not remember the biopsies.  She currently has no pain in the left breast.  She cannot remember what she did yesterday.  Otherwise she is very pleasant, cooperative, and for example was able to asked me to contact Dr. Hassell Done after today's visit   COVID 19 VACCINATION STATUS: Status post injection times 2+ booster   HISTORY OF CURRENT ILLNESS: From the original intake note:  Amanda Green had routine left screening mammography on 02/24/2019 showing a possible abnormality. Of note, she has a history of right mastectomy in 1999 under Dr. Hassell Done.   She underwent left diagnostic mammography at [facility] on 03/04/2019 showing: breast density category B; indeterminate calcifications in the upper-outer left breast measuring 2.9 cm.  Accordingly on 03/04/2019 she proceeded to biopsy of the left breast area in question. The pathology from this procedure ([id number]) showed: ductal carcinoma in situ with calcifications, grade 3. Prognostic indicators significant for: estrogen receptor, 90% positive with strong staining intensity and progesterone receptor, 0% negative.  She was started on tamoxifen shortly after by Dr. Hassell Done due to surgery delay for pandemic concerns.  She is scheduled for definitive left breast surgery 07/17/2019  The patient's subsequent history is as detailed below.   PAST MEDICAL HISTORY: Past Medical History:  Diagnosis Date  . Arthritis   . Breast cancer ([place]) 1999   right mastectomy  . Breast cancer, left ([place]) 05/2019   left lumpectomy  . Diabetes mellitus   . Dislocation of left shoulder joint   . Family history of prostate cancer   . Hyperlipidemia   . Hypertension   . Memory loss   . Numbness and tingling of right lower extremity   . Paresthesia   . Stroke ([place])    light stroke - 2012 ,right leg nerve damage     PAST SURGICAL HISTORY: Past Surgical History:  Procedure Laterality Date  . ABDOMINAL HYSTERECTOMY    . APPENDECTOMY    . BREAST BIOPSY Left 01/19/2010  . BREAST LUMPECTOMY WITH RADIOACTIVE SEED LOCALIZATION Left 07/17/2019   Procedure: LEFT BREAST LUMPECTOMY WITH RADIOACTIVE SEED LOCALIZATION;  Surgeon: Johnathan Hausen,  MD;  Location: [place];  Service: General;  Laterality: Left;  . BREAST SURGERY     Right mastectomy 1999  . CARPAL TUNNEL RELEASE Right 09/30/2014   Procedure: RIGHT CARPAL TUNNEL  RELEASE;  Surgeon: Daryll Brod, MD;  Location: [place];  Service: Orthopedics;  Laterality: Right;  . COLONOSCOPY    . EYE SURGERY     cataracts  . JOINT REPLACEMENT     lt total knee 2011  . LIPOMA EXCISION  11/28/2011   Procedure: EXCISION LIPOMA;  Surgeon: Pedro Earls, MD;  Location: [place];  Service: General;  Laterality: N/A;  excisino lipoma 4 cm back of neck  . MASTECTOMY Right 1999    FAMILY HISTORY: Family History  Problem Relation Age of Onset  . Esophageal cancer Mother        smoker  . Other Father        Unsure of medical history.  . Prostate cancer Brother        d. 25s  . Cancer Son        ?? unsure, but may have had cancer  . Cancer Son        ?? unsure, but may have had cancer  Patient's father died from lung cancer. Patient's mother died from esophageal cancer. The patient denies a family hx of breast or ovarian cancer. She has 1 brother. He was diagnosed with prostate cancer.   GYNECOLOGIC HISTORY:  No LMP recorded. Patient is postmenopausal. Menarche: 86 years old Age at first live birth: 86 years old Bron[place] P 5 LMP age 69 Hysterectomy? Yes, age 62, uterus only BSO? No Hormone replacement?   SOCIAL HISTORY: (updated 06/10/2019)  [name] is originally from [place].  She is currently retired. She is widowed. She lives at home with 1 of her granddaughteris, who is 72 and works at the [employer].  Another of her granddaughters Amanda Green works here in our center.    ADVANCED DIRECTIVES: not in place; she plans to name her daughter Amanda Green) as her healthcare power of attorney.  At the 09/14/2019 visit she was given the appropriate forms to complete and notarize at her discretion   HEALTH MAINTENANCE: Social History   Tobacco Use  . Smoking status: Never Smoker  . Smokeless tobacco: Never Used  Substance Use Topics  . Alcohol use: No  . Drug use: No     Colonoscopy: 03/2011, Dr. Penelope Coop, benign polyp  PAP: not on  file, remote abdominal hysterectomy  Bone density: 06/2019, -1.8   Allergies  Allergen Reactions  . Gabapentin     Other reaction(s): drowsiness    Current Outpatient Medications  Medication Sig Dispense Refill  . albuterol (VENTOLIN HFA) 108 (90 Base) MCG/ACT inhaler 2 puffs    . amLODipine (NORVASC) 10 MG tablet Take 10 mg by mouth daily.    Marland Kitchen aspirin EC 81 MG tablet Take 81 mg by mouth daily. Swallow whole.    Marland Kitchen atorvastatin (LIPITOR) 80 MG tablet Take 80 mg by mouth daily.    . bimatoprost (LUMIGAN) 0.01 % SOLN 1 drop into affected eye in the evening    . brimonidine-timolol (COMBIGAN) 0.2-0.5 % ophthalmic solution 1 drop into affected eye    . brinzolamide (AZOPT) 1 % ophthalmic suspension 1 drop into affected eye    . dorzolamide (TRUSOPT) 2 % ophthalmic solution SMARTSIG:1 In Eye(s) Twice Daily    . hydrochlorothiazide (MICROZIDE) 12.5 MG capsule Take 12.5 mg by mouth daily.    Marland Kitchen  latanoprost (XALATAN) 0.005 % ophthalmic solution     . lisinopril (ZESTRIL) 20 MG tablet Take 20 mg by mouth daily.    . meloxicam (MOBIC) 15 MG tablet Take 15 mg by mouth daily.    . metFORMIN (GLUCOPHAGE) 500 MG tablet Take 1,000 mg by mouth 2 (two) times daily with a meal.     . pregabalin (LYRICA) 50 MG capsule Take 1 capsule (50 mg total) by mouth 3 (three) times daily. 90 capsule 5  . Saccharomyces boulardii (PROBIOTIC) 250 MG CAPS 1 capsule    . tamoxifen (NOLVADEX) 20 MG tablet Take 20 mg by mouth daily.     No current facility-administered medications for this visit.    OBJECTIVE: African-American woman who appears stated age  2:   04/06/21 1431  BP: (!) 153/74  Pulse: 83  Resp: 18  Temp: 97.6 F (36.4 C)  SpO2: 98%   Wt Readings from Last 3 Encounters:  04/06/21 142 lb 14.4 oz (64.8 kg)  02/07/21 139 lb (63 kg)  12/26/20 138 lb (62.6 kg)   Body mass index is 27 kg/m.   ECOG FS:  Sclerae unicteric, EOMs intact Wearing a mask No cervical or supraclavicular  adenopathy Lungs no rales or rhonchi Heart regular rate and rhythm Abd soft, nontender, positive bowel sounds MSK no focal spinal tenderness, no upper extremity lymphedema Neuro: nonfocal, pleasant and cooperative affect Breasts: The right breast is status post remote mastectomy.  There is no evidence of chest wall recurrence.  The left breast shows a significant ecchymosis in the inferior portion, but no palpable mass and no skin or nipple changes of concern.  Both axillae are benign   LAB RESULTS:  CMP     Component Value Date/Time   NA 139 02/07/2021 1050   K 4.9 02/07/2021 1050   CL 103 02/07/2021 1050   CO2 25 02/07/2021 1050   GLUCOSE 191 (H) 02/07/2021 1050   GLUCOSE 237 (H) 07/15/2019 1200   BUN 17 02/07/2021 1050   CREATININE 0.86 02/07/2021 1050   CALCIUM 10.4 (H) 02/07/2021 1050   PROT 6.5 02/07/2021 1050   ALBUMIN 3.9 02/07/2021 1050   AST 17 02/07/2021 1050   ALT 10 02/07/2021 1050   ALKPHOS 68 02/07/2021 1050   BILITOT 0.4 02/07/2021 1050   GFRNONAA 62 02/07/2021 1050   GFRAA 72 02/07/2021 1050    No results found for: TOTALPROTELP, ALBUMINELP, A1GS, A2GS, BETS, BETA2SER, GAMS, MSPIKE, SPEI  No results found for: KPAFRELGTCHN, LAMBDASER, KAPLAMBRATIO  Lab Results  Component Value Date   WBC 6.5 02/07/2021   NEUTROABS 4.4 02/07/2021   HGB 12.3 02/07/2021   HCT 38.0 02/07/2021   MCV 88 02/07/2021   PLT 332 08/13/2016   No results found for: LABCA2  No components found for: RXVQMG867  No results for input(s): INR in the last 168 hours.  No results found for: LABCA2  No results found for: YPP509  No results found for: TOI712  No results found for: WPY099  No results found for: CA2729  No components found for: HGQUANT  No results found for: CEA1 / No results found for: CEA1   No results found for: AFPTUMOR  No results found for: CHROMOGRNA  No results found for: HGBA, HGBA2QUANT, HGBFQUANT, HGBSQUAN (Hemoglobinopathy evaluation)   No  results found for: LDH  No results found for: IRON, TIBC, IRONPCTSAT (Iron and TIBC)  No results found for: FERRITIN  Urinalysis    Component Value Date/Time   COLORURINE YELLOW 03/23/2010 1156  APPEARANCEUR CLEAR 03/23/2010 1156   LABSPEC 1.011 03/23/2010 1156   PHURINE 7.0 03/23/2010 [place] 03/23/2010 1156   HGBUR NEGATIVE 03/23/2010 [place] 03/23/2010 1156   KETONESUR NEGATIVE 03/23/2010 1156   PROTEINUR NEGATIVE 03/23/2010 1156   UROBILINOGEN 0.2 03/23/2010 1156   NITRITE NEGATIVE 03/23/2010 1156   LEUKOCYTESUR SMALL (A) 03/23/2010 1156    STUDIES: DG Foot Complete Left  Result Date: 03/08/2021 Please see detailed radiograph report in office note.  MM DIAG BREAST TOMO UNI LEFT  Result Date: 03/23/2021 CLINICAL DATA:  86 year old female for annual follow-up. History of bilateral breast cancers with LEFT lumpectomy in 2020 and RIGHT mastectomy in 1999. EXAM: DIGITAL DIAGNOSTIC UNILATERAL LEFT MAMMOGRAM WITH TOMOSYNTHESIS AND CAD TECHNIQUE: Left digital diagnostic mammography and breast tomosynthesis was performed. The images were evaluated with computer-aided detection. COMPARISON:  02/26/2020 and prior LEFT mammograms ACR Breast Density Category b: There are scattered areas of fibroglandular density. FINDINGS: Full field and magnification views of the LEFT breast demonstrate lumpectomy changes within the UPPER-OUTER LEFT breast. Throughout the UPPER OUTER LEFT breast there are new pleomorphic calcifications spanning a distance of at least 10 cm, many in a linear orientation and suspicious for DCIS. IMPRESSION: 1. New suspicious calcifications within the UPPER-OUTER LEFT breast spanning a distance of at least 10 cm and suspicious for DCIS. Tissue sampling is recommended. RECOMMENDATION: 3D/stereotactic guided biopsies of LEFT breast calcifications, which will be scheduled. One or two site biopsy will be decided by the performing radiologist. I  have discussed the findings and recommendations with the patient. If applicable, a reminder letter will be sent to the patient regarding the next appointment. BI-RADS CATEGORY  4: Suspicious. Electronically Signed   By: Margarette Canada M.D.   On: 03/23/2021 11:38   MM CLIP PLACEMENT LEFT  Result Date: 03/30/2021 CLINICAL DATA:  86 year old female status post stereotactic biopsy of the left breast. EXAM: DIAGNOSTIC LEFT MAMMOGRAM POST STEREOTACTIC BIOPSY COMPARISON:  Previous exam(s). FINDINGS: Mammographic images were obtained following stereotactic guided biopsy of x2. Both post biopsy marking clips are in the expected locations. IMPRESSION: Appropriate positioning of both post biopsy marking clips within the upper-outer left breast. Please note the suspicious calcifications extend approximately 4 cm anterior and superior to the X shaped clip. Final Assessment: Post Procedure Mammograms for Marker Placement Electronically Signed   By: Kristopher Oppenheim M.D.   On: 03/30/2021 10:34   MM LT BREAST BX W LOC DEV 1ST LESION IMAGE BX SPEC STEREO GUIDE  Addendum Date: 04/05/2021   ADDENDUM REPORT: 04/03/2021 09:58 ADDENDUM: Pathology revealed HIGH GRADE DUCTAL CARCINOMA IN SITU WITH NECROSIS AND CALCIFICATIONS of the Left breast, upper outer quadrant, posterior, (coil clip). This was found to be concordant by Dr. Kristopher Oppenheim. Pathology revealed HIGH GRADE DUCTAL CARCINOMA IN SITU WITH NECROSIS AND CALCIFICATIONS of the Left breast, upper outer central, middle, (x clip). This was found to be concordant by Dr. Kristopher Oppenheim. Pathology results were discussed with the patient's daughter, Inge Rise by telephone, per request. Ms. Dayton Bailiff reported her mother did well after the biopsies with tenderness at the sites. Post biopsy instructions and care were reviewed and questions were answered. The patient's daughter was encouraged to call [facility] for any additional concerns. My direct phone  number was provided. Surgical consultation has been arranged with Dr. Johnathan Hausen at [facility], [place] location, on April 05, 2021. Medical oncology referral was arranged with Dr. Tressa Busman at [facility]  [place] on April 06, 2021. The suspicious calcifications span approximately 10 cm in the AP dimension. Additionally, please note the calcifications extend 4 cm anterior and superior to the X shaped clip. Pathology results reported by Terie Purser, RN on 04/03/2021. Electronically Signed   By: Kristopher Oppenheim M.D.   On: 04/03/2021 09:58   Result Date: 04/05/2021 CLINICAL DATA:  86 year old female with suspicious left breast calcifications. EXAM: LEFT BREAST STEREOTACTIC CORE NEEDLE BIOPSY x2 COMPARISON:  Previous exams. FINDINGS: The patient and I discussed the procedure of stereotactic-guided biopsy including benefits and alternatives. We discussed the high likelihood of a successful procedure. We discussed the risks of the procedure including infection, bleeding, tissue injury, clip migration, and inadequate sampling. Informed written consent was given. The usual time out protocol was performed immediately prior to the procedure. Lesion quadrant: Upper outer quadrant Using sterile technique and 1% Lidocaine as local anesthetic, under stereotactic guidance, a 9 gauge vacuum assisted device was used to perform core needle biopsy of calcifications in the posterior upper outer quadrant of the left breast using a superior approach. Specimen radiograph was performed showing calcifications within multiple specimens. Specimens with calcifications are identified for pathology. At the conclusion of the procedure, a coil shaped tissue marker clip was deployed into the biopsy cavity. Lesion quadrant: Upper outer quadrant Using sterile technique and 1% Lidocaine as local anesthetic, under stereotactic guidance, a 9 gauge vacuum assisted device was used to perform core needle biopsy of  calcifications in the anterior upper-outer quadrant of the left breast using a superior approach. Specimen radiograph was performed showing calcifications within multiple specimens. Specimens with calcifications are identified for pathology. At the conclusion of the procedure, an X shaped tissue marker clip was deployed into the biopsy cavity. Follow-up 2-view mammogram was performed and dictated separately. IMPRESSION: Stereotactic-guided biopsy of the left breast x2. No apparent complications. Electronically Signed: By: Kristopher Oppenheim M.D. On: 03/30/2021 10:31   MM LT BREAST BX W LOC DEV EA AD LESION IMG BX SPEC STEREO GUIDE  Addendum Date: 04/05/2021   ADDENDUM REPORT: 04/03/2021 09:58 ADDENDUM: Pathology revealed HIGH GRADE DUCTAL CARCINOMA IN SITU WITH NECROSIS AND CALCIFICATIONS of the Left breast, upper outer quadrant, posterior, (coil clip). This was found to be concordant by Dr. Kristopher Oppenheim. Pathology revealed HIGH GRADE DUCTAL CARCINOMA IN SITU WITH NECROSIS AND CALCIFICATIONS of the Left breast, upper outer central, middle, (x clip). This was found to be concordant by Dr. Kristopher Oppenheim. Pathology results were discussed with the patient's daughter, Inge Rise by telephone, per request. Ms. Dayton Bailiff reported her mother did well after the biopsies with tenderness at the sites. Post biopsy instructions and care were reviewed and questions were answered. The patient's daughter was encouraged to call [facility] for any additional concerns. My direct phone number was provided. Surgical consultation has been arranged with Dr. Johnathan Hausen at [facility], [place] location, on April 05, 2021. Medical oncology referral was arranged with Dr. Tressa Busman at [facility] on April 06, 2021. The suspicious calcifications span approximately 10 cm in the AP dimension. Additionally, please note the calcifications extend 4 cm anterior and superior to  the X shaped clip. Pathology results reported by Terie Purser, RN on 04/03/2021. Electronically Signed   By: Kristopher Oppenheim M.D.   On: 04/03/2021 09:58   Result Date: 04/05/2021 CLINICAL DATA:  86 year old female with suspicious left breast calcifications. EXAM: LEFT BREAST STEREOTACTIC CORE NEEDLE BIOPSY x2 COMPARISON:  Previous exams.  FINDINGS: The patient and I discussed the procedure of stereotactic-guided biopsy including benefits and alternatives. We discussed the high likelihood of a successful procedure. We discussed the risks of the procedure including infection, bleeding, tissue injury, clip migration, and inadequate sampling. Informed written consent was given. The usual time out protocol was performed immediately prior to the procedure. Lesion quadrant: Upper outer quadrant Using sterile technique and 1% Lidocaine as local anesthetic, under stereotactic guidance, a 9 gauge vacuum assisted device was used to perform core needle biopsy of calcifications in the posterior upper outer quadrant of the left breast using a superior approach. Specimen radiograph was performed showing calcifications within multiple specimens. Specimens with calcifications are identified for pathology. At the conclusion of the procedure, a coil shaped tissue marker clip was deployed into the biopsy cavity. Lesion quadrant: Upper outer quadrant Using sterile technique and 1% Lidocaine as local anesthetic, under stereotactic guidance, a 9 gauge vacuum assisted device was used to perform core needle biopsy of calcifications in the anterior upper-outer quadrant of the left breast using a superior approach. Specimen radiograph was performed showing calcifications within multiple specimens. Specimens with calcifications are identified for pathology. At the conclusion of the procedure, an X shaped tissue marker clip was deployed into the biopsy cavity. Follow-up 2-view mammogram was performed and dictated separately. IMPRESSION:  Stereotactic-guided biopsy of the left breast x2. No apparent complications. Electronically Signed: By: Kristopher Oppenheim M.D. On: 03/30/2021 10:31     ELIGIBLE FOR AVAILABLE RESEARCH PROTOCOL: no  ASSESSMENT: 86 y.o. [place] woman  (1) status post right mastectomy 1999 -- no further details available  (2) status post left breast biopsy 03/04/2019 for a clinical 2.9 cm noninvasive breast cancer (ductal carcinoma in situ), grade 3, estrogen receptor positive, progesterone receptor negative  (3) tamoxifen started March 2020, discontinued April 2022 with disease progression  (a) bone density 06/23/2019 shows a T score of -1.8   (4) left lumpectomy 07/17/2019 showed ductal carcinoma in situ measuring 2.7 cm, with close but negative margins  (5) genetics testing 09/25/2019 through the Common Hereditary Gene Panel offered by [facility] found no deleterious mutations in APC, ATM, AXIN2, BARD1, BMPR1A, BRCA1, BRCA2, BRIP1, CDH1, CDK4, CDKN2A (p14ARF), CDKN2A (p16INK4a), CHEK2, CTNNA1, DICER1, EPCAM (Deletion/duplication testing only), GREM1 (promoter region deletion/duplication testing only), KIT, MEN1, MLH1, MSH2, MSH3, MSH6, MUTYH, NBN, NF1, NHTL1, PALB2, PDGFRA, PMS2, POLD1, POLE, PTEN, RAD50, RAD51C, RAD51D, RNF43, SDHB, SDHC, SDHD, SMAD4, SMARCA4. STK11, TP53, TSC1, TSC2, and VHL.  The following genes were evaluated for sequence changes only: SDHA and HOXB13 c.251G>A variant only. The report date is 09/25/2019.  (a)  EPCAM VUS noted  (6) left breast biopsy x2 on 03/30/2021 both showed ductal carcinoma in situ, high-grade, estrogen and progesterone receptor positive  (7) mastectomy pending   PLAN: Emilyn is now just over 2 years out from her left lumpectomy.  Despite being on tamoxifen she now has new disease in that breast.  Her case was discussed at the multidisciplinary breast cancer conference yesterday morning and the consensus was that she would be best served by mastectomy.  She has already  met with Dr. Hassell Done who independently made the same assessment.  This is a decision and I support.  If she does have a mastectomy margins are clear and there is no evidence of invasive disease then she likely will be cured and will not need any further oncologic follow-up as far as breast cancer is concerned.  Of course if we do find invasive disease we could consider  anastrozole  I wrote her a prescription for a camisole that will be useful to her when she has the drains postmastectomy and also will serve for bras and prostheses postop  I have made it a return appointment with me in 3 months just to make sure there are no loose ends.  Very likely she will "graduate" after that visit  Total encounter time 25 minutes.* Total encounter time 25 minutes.Sarajane Jews C. Llewellyn Schoenberger, MD 04/06/21 2:48 PM Medical Oncology and Hematology [facility] [place], [place] [address] Tel. [phone]    Fax. [phone]   I, Wilburn Mylar, am acting as scribe for Dr. Virgie Dad. Abran Gavigan.  I, Lurline Del MD, have reviewed the above documentation for accuracy and completeness, and I agree with the above.   *Total Encounter Time as defined by the Centers for Medicare and Medicaid Services includes, in addition to the face-to-face time of a patient visit (documented in the note above) non-face-to-face time: obtaining and reviewing outside history, ordering and reviewing medications, tests or procedures, care coordination (communications with other health care professionals or caregivers) and documentation in the medical record.

## 2021-04-06 ENCOUNTER — Inpatient Hospital Stay: Payer: Medicare Other | Attending: Oncology | Admitting: Oncology

## 2021-04-06 ENCOUNTER — Other Ambulatory Visit: Payer: Self-pay

## 2021-04-06 VITALS — BP 153/74 | HR 83 | Temp 97.6°F | Resp 18 | Ht 61.0 in | Wt 142.9 lb

## 2021-04-06 DIAGNOSIS — Z9011 Acquired absence of right breast and nipple: Secondary | ICD-10-CM | POA: Diagnosis not present

## 2021-04-06 DIAGNOSIS — Z809 Family history of malignant neoplasm, unspecified: Secondary | ICD-10-CM | POA: Insufficient documentation

## 2021-04-06 DIAGNOSIS — Z170 Estrogen receptor positive status [ER+]: Secondary | ICD-10-CM | POA: Insufficient documentation

## 2021-04-06 DIAGNOSIS — Z780 Asymptomatic menopausal state: Secondary | ICD-10-CM | POA: Diagnosis not present

## 2021-04-06 DIAGNOSIS — Z800 Family history of malignant neoplasm of digestive organs: Secondary | ICD-10-CM | POA: Insufficient documentation

## 2021-04-06 DIAGNOSIS — Z90710 Acquired absence of both cervix and uterus: Secondary | ICD-10-CM | POA: Insufficient documentation

## 2021-04-06 DIAGNOSIS — D0512 Intraductal carcinoma in situ of left breast: Secondary | ICD-10-CM | POA: Insufficient documentation

## 2021-04-06 DIAGNOSIS — Z96652 Presence of left artificial knee joint: Secondary | ICD-10-CM | POA: Insufficient documentation

## 2021-04-06 DIAGNOSIS — I482 Chronic atrial fibrillation, unspecified: Secondary | ICD-10-CM | POA: Diagnosis not present

## 2021-04-06 DIAGNOSIS — Z86000 Personal history of in-situ neoplasm of breast: Secondary | ICD-10-CM | POA: Diagnosis not present

## 2021-04-12 ENCOUNTER — Encounter (HOSPITAL_BASED_OUTPATIENT_CLINIC_OR_DEPARTMENT_OTHER): Payer: Self-pay | Admitting: Surgery

## 2021-04-12 ENCOUNTER — Other Ambulatory Visit: Payer: Self-pay

## 2021-04-13 ENCOUNTER — Telehealth: Payer: Self-pay | Admitting: Oncology

## 2021-04-13 DIAGNOSIS — C50911 Malignant neoplasm of unspecified site of right female breast: Secondary | ICD-10-CM | POA: Diagnosis not present

## 2021-04-13 NOTE — Telephone Encounter (Signed)
Scheduled per 4/21 los. Called and spoke with daughter confirmed 7/21 appt

## 2021-04-14 ENCOUNTER — Observation Stay (HOSPITAL_COMMUNITY)
Admission: EM | Admit: 2021-04-14 | Discharge: 2021-04-15 | Disposition: A | Discharge status: Home / Self Care | Payer: Medicare PPO | Attending: Internal Medicine | Admitting: Internal Medicine

## 2021-04-14 ENCOUNTER — Other Ambulatory Visit: Payer: Self-pay

## 2021-04-14 ENCOUNTER — Encounter (HOSPITAL_COMMUNITY): Payer: Self-pay | Admitting: Emergency Medicine

## 2021-04-14 ENCOUNTER — Emergency Department (HOSPITAL_COMMUNITY): Payer: Medicare PPO

## 2021-04-14 ENCOUNTER — Encounter (HOSPITAL_BASED_OUTPATIENT_CLINIC_OR_DEPARTMENT_OTHER)
Admission: RE | Admit: 2021-04-14 | Discharge: 2021-04-14 | Disposition: A | Discharge status: Home / Self Care | Payer: Medicare Other | Source: Ambulatory Visit | Attending: Surgery | Admitting: Surgery

## 2021-04-14 ENCOUNTER — Other Ambulatory Visit (HOSPITAL_COMMUNITY): Payer: Medicare PPO

## 2021-04-14 DIAGNOSIS — E119 Type 2 diabetes mellitus without complications: Secondary | ICD-10-CM

## 2021-04-14 DIAGNOSIS — Z853 Personal history of malignant neoplasm of breast: Secondary | ICD-10-CM | POA: Diagnosis not present

## 2021-04-14 DIAGNOSIS — Z96652 Presence of left artificial knee joint: Secondary | ICD-10-CM | POA: Diagnosis not present

## 2021-04-14 DIAGNOSIS — Z8673 Personal history of transient ischemic attack (TIA), and cerebral infarction without residual deficits: Secondary | ICD-10-CM | POA: Insufficient documentation

## 2021-04-14 DIAGNOSIS — I4891 Unspecified atrial fibrillation: Principal | ICD-10-CM | POA: Insufficient documentation

## 2021-04-14 DIAGNOSIS — Z79899 Other long term (current) drug therapy: Secondary | ICD-10-CM | POA: Insufficient documentation

## 2021-04-14 DIAGNOSIS — E118 Type 2 diabetes mellitus with unspecified complications: Secondary | ICD-10-CM | POA: Diagnosis not present

## 2021-04-14 DIAGNOSIS — Z9011 Acquired absence of right breast and nipple: Secondary | ICD-10-CM | POA: Diagnosis not present

## 2021-04-14 DIAGNOSIS — R000 Tachycardia, unspecified: Secondary | ICD-10-CM | POA: Diagnosis present

## 2021-04-14 DIAGNOSIS — C50912 Malignant neoplasm of unspecified site of left female breast: Secondary | ICD-10-CM | POA: Diagnosis not present

## 2021-04-14 DIAGNOSIS — Z170 Estrogen receptor positive status [ER+]: Secondary | ICD-10-CM

## 2021-04-14 DIAGNOSIS — I10 Essential (primary) hypertension: Secondary | ICD-10-CM | POA: Insufficient documentation

## 2021-04-14 DIAGNOSIS — Z86000 Personal history of in-situ neoplasm of breast: Secondary | ICD-10-CM | POA: Diagnosis not present

## 2021-04-14 DIAGNOSIS — Z20822 Contact with and (suspected) exposure to covid-19: Secondary | ICD-10-CM | POA: Diagnosis not present

## 2021-04-14 DIAGNOSIS — C50919 Malignant neoplasm of unspecified site of unspecified female breast: Secondary | ICD-10-CM | POA: Diagnosis present

## 2021-04-14 DIAGNOSIS — Z7984 Long term (current) use of oral hypoglycemic drugs: Secondary | ICD-10-CM | POA: Insufficient documentation

## 2021-04-14 DIAGNOSIS — Z7982 Long term (current) use of aspirin: Secondary | ICD-10-CM | POA: Insufficient documentation

## 2021-04-14 DIAGNOSIS — D0512 Intraductal carcinoma in situ of left breast: Secondary | ICD-10-CM | POA: Diagnosis not present

## 2021-04-14 DIAGNOSIS — I480 Paroxysmal atrial fibrillation: Secondary | ICD-10-CM | POA: Diagnosis present

## 2021-04-14 LAB — BASIC METABOLIC PANEL
Anion gap: 8 (ref 5–15)
Anion gap: 7 (ref 5–15)
BUN: 16 mg/dL (ref 8–23)
BUN: 15 mg/dL (ref 8–23)
CO2: 26 mmol/L (ref 22–32)
CO2: 27 mmol/L (ref 22–32)
Calcium: 9.7 mg/dL (ref 8.9–10.3)
Calcium: 9.4 mg/dL (ref 8.9–10.3)
Chloride: 105 mmol/L (ref 98–111)
Chloride: 106 mmol/L (ref 98–111)
Creatinine, Ser: 0.92 mg/dL (ref 0.44–1.00)
Creatinine, Ser: 0.95 mg/dL (ref 0.44–1.00)
GFR, Estimated: >60 mL/min (ref >60)
GFR, Estimated: 59 mL/min — ABNORMAL LOW (ref >60)
Glucose, Bld: 298 mg/dL — ABNORMAL HIGH (ref 70–99)
Glucose, Bld: 272 mg/dL — ABNORMAL HIGH (ref 70–99)
Potassium: 4.2 mmol/L (ref 3.5–5.1)
Potassium: 4.6 mmol/L (ref 3.5–5.1)
Sodium: 139 mmol/L (ref 135–145)
Sodium: 140 mmol/L (ref 135–145)

## 2021-04-14 LAB — GLUCOSE, CAPILLARY
Glucose-Capillary: 217 mg/dL — ABNORMAL HIGH (ref 70–99)
Glucose-Capillary: 149 mg/dL — ABNORMAL HIGH (ref 70–99)

## 2021-04-14 LAB — CBC
HCT: 40.6 % (ref 36.0–46.0)
Hemoglobin: 13 g/dL (ref 12.0–15.0)
MCH: 29.2 pg (ref 26.0–34.0)
MCHC: 32 g/dL (ref 30.0–36.0)
MCV: 91.2 fL (ref 80.0–100.0)
Platelets: 267 10*3/uL (ref 150–400)
RBC: 4.45 MIL/uL (ref 3.87–5.11)
RDW: 15.3 % (ref 11.5–15.5)
WBC: 6.8 10*3/uL (ref 4.0–10.5)
nRBC: 0 % (ref 0.0–0.2)

## 2021-04-14 LAB — RESP PANEL BY RT-PCR (FLU A&B, COVID) ARPGX2
Influenza A by PCR: NEGATIVE
Influenza B by PCR: NEGATIVE
SARS Coronavirus 2 by RT PCR: NEGATIVE

## 2021-04-14 LAB — MAGNESIUM: Magnesium: 1.9 mg/dL (ref 1.7–2.4)

## 2021-04-14 LAB — HEPARIN LEVEL (UNFRACTIONATED): Heparin Unfractionated: 0.45 IU/mL (ref 0.30–0.70)

## 2021-04-14 LAB — TSH: TSH: 2.74 u[IU]/mL (ref 0.350–4.500)

## 2021-04-14 LAB — HEMOGLOBIN A1C
Hgb A1c MFr Bld: 6.8 % — ABNORMAL HIGH (ref 4.8–5.6)
Mean Plasma Glucose: 148.46 mg/dL

## 2021-04-14 MED ORDER — DILTIAZEM HCL 25 MG/5ML IV SOLN
10.0000 mg | Freq: Once | INTRAVENOUS | Status: AC
  Administered 2021-04-14: 10 mg via INTRAVENOUS
  Filled 2021-04-14: qty 5

## 2021-04-14 MED ORDER — MAGNESIUM SULFATE IN D5W 1-5 GM/100ML-% IV SOLN
1.0000 g | Freq: Once | INTRAVENOUS | Status: AC
  Administered 2021-04-14: 1 g via INTRAVENOUS
  Filled 2021-04-14: qty 100

## 2021-04-14 MED ORDER — HEPARIN BOLUS VIA INFUSION
3000.0000 [IU] | Freq: Once | INTRAVENOUS | Status: AC
  Administered 2021-04-14: 3000 [IU] via INTRAVENOUS
  Filled 2021-04-14: qty 3000

## 2021-04-14 MED ORDER — LATANOPROST 0.005 % OP SOLN
1.0000 [drp] | Freq: Every day | OPHTHALMIC | Status: DC
  Administered 2021-04-14: 1 [drp] via OPHTHALMIC
  Filled 2021-04-14: qty 2.5

## 2021-04-14 MED ORDER — PREGABALIN 25 MG PO CAPS
50.0000 mg | ORAL_CAPSULE | Freq: Three times a day (TID) | ORAL | Status: DC
  Administered 2021-04-14 – 2021-04-15 (×2): 50 mg via ORAL
  Filled 2021-04-14 (×2): qty 2

## 2021-04-14 MED ORDER — HEPARIN (PORCINE) 25000 UT/250ML-% IV SOLN
900.0000 [IU]/h | INTRAVENOUS | Status: DC
  Administered 2021-04-14: 900 [IU]/h via INTRAVENOUS
  Filled 2021-04-14: qty 250

## 2021-04-14 MED ORDER — BRIMONIDINE TARTRATE-TIMOLOL 0.2-0.5 % OP SOLN: 1.0000 [drp] | Freq: Two times a day (BID) | OPHTHALMIC | Status: DC

## 2021-04-14 MED ORDER — DORZOLAMIDE HCL 2 % OP SOLN
1.0000 [drp] | Freq: Every day | OPHTHALMIC | Status: DC
  Administered 2021-04-14 – 2021-04-15 (×2): 1 [drp] via OPHTHALMIC
  Filled 2021-04-14: qty 10

## 2021-04-14 MED ORDER — DILTIAZEM HCL-DEXTROSE 125-5 MG/125ML-% IV SOLN (PREMIX)
5.0000 mg/h | INTRAVENOUS | Status: DC
  Administered 2021-04-14: 5 mg/h via INTRAVENOUS
  Filled 2021-04-14: qty 125

## 2021-04-14 MED ORDER — ACETAMINOPHEN 325 MG PO TABS: 650.0000 mg | ORAL_TABLET | ORAL | Status: DC | PRN

## 2021-04-14 MED ORDER — ONDANSETRON HCL 4 MG/2ML IJ SOLN: 4.0000 mg | Freq: Four times a day (QID) | INTRAMUSCULAR | Status: DC | PRN

## 2021-04-14 MED ORDER — BRIMONIDINE TARTRATE 0.2 % OP SOLN
1.0000 [drp] | Freq: Two times a day (BID) | OPHTHALMIC | Status: DC
  Administered 2021-04-14 – 2021-04-15 (×2): 1 [drp] via OPHTHALMIC
  Filled 2021-04-14: qty 5

## 2021-04-14 MED ORDER — ATORVASTATIN CALCIUM 80 MG PO TABS
80.0000 mg | ORAL_TABLET | Freq: Every day | ORAL | Status: DC
  Administered 2021-04-14 – 2021-04-15 (×2): 80 mg via ORAL
  Filled 2021-04-14 (×2): qty 1

## 2021-04-14 MED ORDER — INSULIN ASPART 100 UNIT/ML IJ SOLN: 0.0000 [IU] | Freq: Every day | INTRAMUSCULAR | Status: DC

## 2021-04-14 MED ORDER — INSULIN ASPART 100 UNIT/ML IJ SOLN
0.0000 [IU] | Freq: Three times a day (TID) | INTRAMUSCULAR | Status: DC
  Administered 2021-04-14 – 2021-04-15 (×2): 3 [IU] via SUBCUTANEOUS

## 2021-04-14 MED ORDER — TIMOLOL MALEATE 0.5 % OP SOLN
1.0000 [drp] | Freq: Two times a day (BID) | OPHTHALMIC | Status: DC
  Administered 2021-04-14 – 2021-04-15 (×2): 1 [drp] via OPHTHALMIC
  Filled 2021-04-14: qty 5

## 2021-04-14 MED ORDER — DILTIAZEM LOAD VIA INFUSION
20.0000 mg | Freq: Once | INTRAVENOUS | Status: AC
  Administered 2021-04-14: 20 mg via INTRAVENOUS
  Filled 2021-04-14: qty 20

## 2021-04-14 NOTE — Progress Notes (Signed)
ANTICOAGULATION CONSULT NOTE - Follow Up Consult  Pharmacy Consult for heparin Indication: atrial fibrillation  Labs: Recent Labs    04/14/21 1140 04/14/21 1308 04/14/21 2238  HGB 13.0  --   --   HCT 40.6  --   --   PLT 267  --   --   HEPARINUNFRC  --   --  0.45  CREATININE 0.92 0.95  --     Assessment/Plan:  86yo female therapeutic on heparin with initial dosing for new Afib. Will continue gtt at current rate of 900 units/hr and confirm stable with am labs.   Wynona Neat, PharmD, BCPS  04/14/2021,11:21 PM

## 2021-04-14 NOTE — ED Triage Notes (Addendum)
Pt here pov after going to lab work done for scheduled mastectomy Tuesday, when they noted she had a fast heart rate and told her to go to the ER. PT denies any cp/shob, has no complaints. HR  130-140s, afib. no distress noted. Pt denies hx of afib, no blood thinners. Hx of mastectomy on R side

## 2021-04-14 NOTE — Progress Notes (Signed)
Dr. Roanna Banning made aware of EKG showing pt in afib with RVR. NAD.Dr Roanna Banning spoke with pt and daughter, patient denies feeling SOB or having chest discomfort/pain. Vital signs obtained: BP-135/89, Pulse-60-120,O2 sat-97 %. Pt and daughter instructed to go directly to ED to be evaluated, pt and daughter agreed and verbalized understanding. Called ED and gave report to [place]. Patient transported via wheelchair to car. Notified Sunday Spillers at Dr. Earlie Server office.

## 2021-04-14 NOTE — Progress Notes (Signed)

## 2021-04-14 NOTE — H&P (Signed)
History and Physical    [name] [id number] DOB: 1935-08-07 DOA: 04/14/2021  Referring MD/NP/PA: Margarita Mail, PA-C PCP: Aurea Graff.[name], MD  Consultants: Johnathan Hausen, MD-General surgery, Lurline Del, MD-oncology, Katina Dung oncology Patient coming from: via private vehicle  Chief Complaint: Fast heart rate  I have personally briefly reviewed patient's old medical records in [place]   HPI: Amanda Green is a 86 y.o. female with medical history significant of hypertension, hyperlipidemia, CVA without residual deficit, DM type II, breast cancer s/p right masectomy 1999, and memory loss who presents after being advised to come to the emergency department for fast heart rate.  She had gone into do routine lab work prior to her scheduled left mastectomy on 5/3 and was found to have a irregular and elevated heart rate into the 140s.  She was sent to the hospital for further evaluation.  Patient denies having any symptoms of chest pain, palpitations, shortness of breath, cough, leg swelling, vomiting, diarrhea, or dysuria.  Family present at bedside notes that the patient had been complaining of some nausea and had appeared to be fatigued.  ED Course: Upon admission into the emergency department patient was seen to be afebrile with pulse elevated up to 142 and atrial fibrillation, respiration 25-32, and all other vital signs maintained.  Labs significant for glucose 272, TSH 2.74, and magnesium 1.9.  Chest x-ray significant for chronic mild enlargement of the cardiac silhouette without edema and mild airway thickening in the lower lobes.  Patient had been given 10 mg of diltiazem IV once with some improvement in heart rates only temporarily.  Patient was started on heparin drip as well as a Cardizem drip.  TRH called to admit.  Review of Systems  Constitutional: Positive for malaise/fatigue. Negative for fever.  HENT: Negative for congestion.   Eyes:  Negative for photophobia and pain.  Respiratory: Negative for cough, shortness of breath and stridor.   Cardiovascular: Negative for chest pain and palpitations.  Gastrointestinal: Positive for nausea. Negative for diarrhea and vomiting.  Genitourinary: Negative for dysuria.  Musculoskeletal: Negative for falls.  Skin: Negative for rash.  Neurological: Negative for focal weakness and loss of consciousness.  Psychiatric/Behavioral: Negative for substance abuse.    Past Medical History:  Diagnosis Date  . Arthritis   . Breast cancer ([facility]) 1999   right mastectomy  . Breast cancer, left ([facility]) 05/2019   left lumpectomy  . Diabetes mellitus   . Dislocation of left shoulder joint   . Family history of prostate cancer   . Hyperlipidemia   . Hypertension   . Memory loss   . Numbness and tingling of right lower extremity   . Paresthesia   . Stroke [facility])    light stroke - 2012 ,right leg nerve damage     Past Surgical History:  Procedure Laterality Date  . ABDOMINAL HYSTERECTOMY    . APPENDECTOMY    . BREAST BIOPSY Left 01/19/2010  . BREAST LUMPECTOMY WITH RADIOACTIVE SEED LOCALIZATION Left 07/17/2019   Procedure: LEFT BREAST LUMPECTOMY WITH RADIOACTIVE SEED LOCALIZATION;  Surgeon: Johnathan Hausen, MD;  Location: [facility];  Service: General;  Laterality: Left;  . BREAST SURGERY     Right mastectomy 1999  . CARPAL TUNNEL RELEASE Right 09/30/2014   Procedure: RIGHT CARPAL TUNNEL RELEASE;  Surgeon: Daryll Brod, MD;  Location: [facility];  Service: Orthopedics;  Laterality: Right;  . COLONOSCOPY    . EYE SURGERY     cataracts  .  JOINT REPLACEMENT     lt total knee 2011  . LIPOMA EXCISION  11/28/2011   Procedure: EXCISION LIPOMA;  Surgeon: Pedro Earls, MD;  Location: [facility];  Service: General;  Laterality: N/A;  excisino lipoma 4 cm back of neck  . MASTECTOMY Right 1999     reports that she has never smoked. She has never used  smokeless tobacco. She reports that she does not drink alcohol and does not use drugs.  Allergies  Allergen Reactions  . Gabapentin     Other reaction(s): drowsiness    Family History  Problem Relation Age of Onset  . Esophageal cancer Mother        smoker  . Other Father        Unsure of medical history.  . Prostate cancer Brother        d. 63s  . Cancer Son        ?? unsure, but may have had cancer  . Cancer Son        ?? unsure, but may have had cancer    Prior to Admission medications   Medication Sig Start Date End Date Taking? Authorizing Provider  amLODipine (NORVASC) 10 MG tablet Take 10 mg by mouth daily.    [provider]  aspirin EC 81 MG tablet Take 81 mg by mouth daily. Swallow whole.    [provider]  atorvastatin (LIPITOR) 80 MG tablet Take 80 mg by mouth daily.    [provider]  brimonidine-timolol (COMBIGAN) 0.2-0.5 % ophthalmic solution 1 drop into affected eye    [provider]  calcium carbonate (OSCAL) 1500 (600 Ca) MG TABS tablet Take by mouth 2 (two) times daily with a meal.    [provider]  dorzolamide (TRUSOPT) 2 % ophthalmic solution SMARTSIG:1 In Eye(s) Twice Daily 10/19/20   [provider]  hydrochlorothiazide (MICROZIDE) 12.5 MG capsule Take 12.5 mg by mouth daily.    [provider]  latanoprost (XALATAN) 0.005 % ophthalmic solution  11/13/15   [provider]  lisinopril (ZESTRIL) 20 MG tablet Take 20 mg by mouth daily.    [provider]  meloxicam (MOBIC) 15 MG tablet Take 15 mg by mouth daily.    [provider]  metFORMIN (GLUCOPHAGE) 500 MG tablet Take 1,000 mg by mouth 2 (two) times daily with a meal.  08/18/11   [provider]  Misc Natural Products (NEURIVA PO) Take by mouth.    [provider]  pregabalin (LYRICA) 50 MG capsule Take 1 capsule (50 mg total) by mouth 3 (three) times daily. 02/07/21   Sater, Nanine Means, MD   Saccharomyces boulardii (PROBIOTIC) 250 MG CAPS 1 capsule Patient not taking: Reported on 04/12/2021    [provider]  tamoxifen (NOLVADEX) 20 MG tablet Take 20 mg by mouth daily. Patient not taking: Reported on 04/12/2021    [provider]  vitamin B-12 (CYANOCOBALAMIN) 1000 MCG tablet Take 1,000 mcg by mouth daily.    [provider]  vitamin C (ASCORBIC ACID) 500 MG tablet Take 500 mg by mouth daily.    [provider]    Physical Exam:  Constitutional: NAD, calm, comfortable Vitals:   04/14/21 1315 04/14/21 1345 04/14/21 1400 04/14/21 1415  BP: 130/71 125/88 117/90 119/61  Pulse: 75 76 81 (!) 59  Resp: (!) 31 (!) 29 (!) 30 (!) 31  Temp:      TempSrc:      SpO2: 100% 100%  98% 98%  Weight:      Height:       Eyes: PERRL, lids and conjunctivae normal ENMT: Mucous membranes are moist. Posterior pharynx clear of any exudate or lesions.Normal dentition.  Neck: normal, supple, no masses, no thyromegaly Respiratory: clear to auscultation bilaterally, no wheezing, no crackles. Normal respiratory effort. No accessory muscle use.  Cardiovascular: Regular rate and rhythm, no murmurs / rubs / gallops. No extremity edema. 2+ pedal pulses. No carotid bruits.  Abdomen: no tenderness, no masses palpated. No hepatosplenomegaly. Bowel sounds positive.  Musculoskeletal: no clubbing / cyanosis. No joint deformity upper and lower extremities. Good ROM, no contractures. Normal muscle tone.  Skin: no rashes, lesions, ulcers. No induration Neurologic: CN 2-12 grossly intact. Sensation intact, DTR normal. Strength 5/5 in all 4.  Psychiatric: Normal judgment and insight. Alert and oriented x 3. Normal mood.     Labs on Admission: I have personally reviewed following labs and imaging studies  CBC: Recent Labs  Lab 04/14/21 1140  WBC 6.8  HGB 13.0  HCT 40.6  MCV 91.2  PLT 409   Basic Metabolic Panel: Recent Labs  Lab 04/14/21 1140 04/14/21 1308  NA  139 140  K 4.2 4.6  CL 105 106  CO2 26 27  GLUCOSE 298* 272*  BUN 16 15  CREATININE 0.92 0.95  CALCIUM 9.7 9.4  MG 1.9  --    GFR: Estimated Creatinine Clearance: 38 mL/min (by C-G formula based on SCr of 0.95 mg/dL). Liver Function Tests: No results for input(s): AST, ALT, ALKPHOS, BILITOT, PROT, ALBUMIN in the last 168 hours. No results for input(s): LIPASE, AMYLASE in the last 168 hours. No results for input(s): AMMONIA in the last 168 hours. Coagulation Profile: No results for input(s): INR, PROTIME in the last 168 hours. Cardiac Enzymes: No results for input(s): CKTOTAL, CKMB, CKMBINDEX, TROPONINI in the last 168 hours. BNP (last 3 results) No results for input(s): PROBNP in the last 8760 hours. HbA1C: No results for input(s): HGBA1C in the last 72 hours. CBG: No results for input(s): GLUCAP in the last 168 hours. Lipid Profile: No results for input(s): CHOL, HDL, LDLCALC, TRIG, CHOLHDL, LDLDIRECT in the last 72 hours. Thyroid Function Tests: No results for input(s): TSH, T4TOTAL, FREET4, T3FREE, THYROIDAB in the last 72 hours. Anemia Panel: No results for input(s): VITAMINB12, FOLATE, FERRITIN, TIBC, IRON, RETICCTPCT in the last 72 hours. Urine analysis:    Component Value Date/Time   COLORURINE YELLOW 03/23/2010 1156   APPEARANCEUR CLEAR 03/23/2010 1156   LABSPEC 1.011 03/23/2010 1156   PHURINE 7.0 03/23/2010 1156   GLUCOSEU NEGATIVE 03/23/2010 1156   HGBUR NEGATIVE 03/23/2010 1156   BILIRUBINUR NEGATIVE 03/23/2010 1156   KETONESUR NEGATIVE 03/23/2010 1156   PROTEINUR NEGATIVE 03/23/2010 1156   UROBILINOGEN 0.2 03/23/2010 1156   NITRITE NEGATIVE 03/23/2010 1156   LEUKOCYTESUR SMALL (A) 03/23/2010 1156   Sepsis Labs: No results found for this or any previous visit (from the past 240 hour(s)).   Radiological Exams on Admission: DG Chest Port 1 View  Result Date: 04/14/2021 CLINICAL DATA:  Atrial fibrillation. Ductal carcinoma in-situ of the left upper outer  breast. EXAM: PORTABLE CHEST 1 VIEW COMPARISON:  Chest radiograph 11/27/2011 FINDINGS: Stable mild enlargement of the cardiopericardial silhouette, without edema. Atherosclerotic calcification of the aortic arch. Reverse lordotic projection. Suspected airway thickening in the lower lobes, which could reflect bronchitis or reactive airways disease. No blunting of the costophrenic angles. Prior right mastectomy. IMPRESSION: 1. Chronic mild enlargement of the cardiopericardial  silhouette, without edema. 2. Suspected mild airway thickening in the lower lobes, potentially from bronchitis or reactive airways. 3.  Aortic Atherosclerosis (ICD10-I70.0). Electronically Signed   By: Van Clines M.D.   On: 04/14/2021 12:20    EKG: Independently reviewed.  Atrial fibrillation at 133 bpm with RVR  Assessment/Plan New onset atrial fibrillation: Patient found in atrial fibrillation with heart rates elevated into the 140s.  TSH within normal limits at 2.74 CHA2DS2-VASc score =  7. -Admit to a progressive bed -Continue heparin drip -Cardizem drip and consider converting to oral medication in a.m. -Check echocardiogram  -Will need to discuss anticoagulation options given need of future procedure -Will need to discuss risks of continuing Mobic as it puts her at increased risk for bleeding  Diabetes mellitus type 2, controlled: On admission glucose elevated into the 200s.  Last hemoglobin A1c was 6 in 2017.  Home medications include metformin 1000 mg twice daily. -Hypoglycemic protocols -Check hemoglobin A1c -Metformin -CBGs before every meal and at bedtime with sensitive SSI   Essential hypertension: Home blood pressure medications include amlodipine 10 mg daily, lisinopril 20 mg daily, hydrochlorothiazide 12.5 mg daily, and lisinopril 20 mg daily. -Held home blood pressure medications as patient's blood pressures were initially soft  Breast cancer: Patient with prior history of breast cancer s/p right  mastectomy and 1999.  Patient found to have invasive ductal carcinoma of the left breast in 2020 status postlumpectomy with radiation seed placement.  Left breast biopsy problems 2 on 03/1421 showed ductal carcinoma in situ for which patient is planning mastectomy initially scheduled for 5/3.  However, due to patient hospitalization was rescheduled for 5/25. -Continue outpatient follow-up with surgery  Hyperlipidemia -Continue atorvastatin  DVT prophylaxis: Heparin Code Status: Full Family Communication: Family updated at bedside Disposition Plan: Likely able to be able to be discharged Consults called: None Admission status: Observation,  Norval Morton MD [facility]   If 7PM-7AM, please contact night-coverage   04/14/2021, 2:25 PM

## 2021-04-14 NOTE — ED Provider Notes (Signed)
[facility] EMERGENCY DEPARTMENT Provider Note   CSN: [id number] Arrival date & time: 04/14/21  1105     History Chief Complaint  Patient presents with  . Tachycardia    Amanda Green is a 86 y.o. female who was sent in from laboratory for evaluation of atrial fibrillation.  The patient is scheduled for mastectomy this coming Tuesday and went in for routine lab work prior to the procedure.  She was found to have a heart rate between 130 and 140, irregularly irregular and was sent in for further evaluation.  The patient states that she has no abnormal sensations including palpitations, shortness of breath, weakness, dizziness, feelings of racing or skipping in her heart, chest pain.  She has no known diagnosis of atrial fibrillation and does not take any blood thinners.  Her daughter who is at the bedside states that she has been more tired.  HPI     Past Medical History:  Diagnosis Date  . Arthritis   . Breast cancer ([place]) 1999   right mastectomy  . Breast cancer, left ([place]) 05/2019   left lumpectomy  . Diabetes mellitus   . Dislocation of left shoulder joint   . Family history of prostate cancer   . Hyperlipidemia   . Hypertension   . Memory loss   . Numbness and tingling of right lower extremity   . Paresthesia   . Stroke [facility])    light stroke - 2012 ,right leg nerve damage     Patient Active Problem List   Diagnosis Date Noted  . New onset atrial fibrillation ([place]) 04/14/2021  . Cerebrovascular accident (CVA) ([place]) 02/07/2021  . Cognitive impairment 02/07/2021  . Genetic testing 09/25/2019  . Family history of prostate cancer   . Ductal carcinoma in situ (DCIS) of left breast 06/10/2019  . Pain 08/13/2018  . Trigger ring finger of left hand 08/13/2018  . Paresthesia of right lower extremity 01/28/2017  . Stroke ([place]) 01/28/2017  . Colon polyps 08/15/2016  . Chronic right shoulder pain 11/24/2015  . Right mastectomy 1999 03/26/2012     Past Surgical History:  Procedure Laterality Date  . ABDOMINAL HYSTERECTOMY    . APPENDECTOMY    . BREAST BIOPSY Left 01/19/2010  . BREAST LUMPECTOMY WITH RADIOACTIVE SEED LOCALIZATION Left 07/17/2019   Procedure: LEFT BREAST LUMPECTOMY WITH RADIOACTIVE SEED LOCALIZATION;  Surgeon: Johnathan Hausen, MD;  Location: [facility];  Service: General;  Laterality: Left;  . BREAST SURGERY     Right mastectomy 1999  . CARPAL TUNNEL RELEASE Right 09/30/2014   Procedure: RIGHT CARPAL TUNNEL RELEASE;  Surgeon: Daryll Brod, MD;  Location: [facility];  Service: Orthopedics;  Laterality: Right;  . COLONOSCOPY    . EYE SURGERY     cataracts  . JOINT REPLACEMENT     lt total knee 2011  . LIPOMA EXCISION  11/28/2011   Procedure: EXCISION LIPOMA;  Surgeon: Pedro Earls, MD;  Location: [facility];  Service: General;  Laterality: N/A;  excisino lipoma 4 cm back of neck  . MASTECTOMY Right 1999     OB History   No obstetric history on file.     Family History  Problem Relation Age of Onset  . Esophageal cancer Mother        smoker  . Other Father        Unsure of medical history.  . Prostate cancer Brother        d. 70s  .  Cancer Son        ?? unsure, but may have had cancer  . Cancer Son        ?? unsure, but may have had cancer    Social History   Tobacco Use  . Smoking status: Never Smoker  . Smokeless tobacco: Never Used  Substance Use Topics  . Alcohol use: No  . Drug use: No    Home Medications Prior to Admission medications   Medication Sig Start Date End Date Taking? Authorizing Provider  amLODipine (NORVASC) 10 MG tablet Take 10 mg by mouth daily.    [provider]  aspirin EC 81 MG tablet Take 81 mg by mouth daily. Swallow whole.    [provider]  atorvastatin (LIPITOR) 80 MG tablet Take 80 mg by mouth daily.    [provider]  brimonidine-timolol (COMBIGAN) 0.2-0.5 % ophthalmic solution 1  drop into affected eye    [provider]  calcium carbonate (OSCAL) 1500 (600 Ca) MG TABS tablet Take by mouth 2 (two) times daily with a meal.    [provider]  dorzolamide (TRUSOPT) 2 % ophthalmic solution SMARTSIG:1 In Eye(s) Twice Daily 10/19/20   [provider]  hydrochlorothiazide (MICROZIDE) 12.5 MG capsule Take 12.5 mg by mouth daily.    [provider]  latanoprost (XALATAN) 0.005 % ophthalmic solution  11/13/15   [provider]  lisinopril (ZESTRIL) 20 MG tablet Take 20 mg by mouth daily.    [provider]  meloxicam (MOBIC) 15 MG tablet Take 15 mg by mouth daily.    [provider]  metFORMIN (GLUCOPHAGE) 500 MG tablet Take 1,000 mg by mouth 2 (two) times daily with a meal.  08/18/11   [provider]  Misc Natural Products (NEURIVA PO) Take by mouth.    [provider]  pregabalin (LYRICA) 50 MG capsule Take 1 capsule (50 mg total) by mouth 3 (three) times daily. 02/07/21   Sater, Nanine Means, MD  Saccharomyces boulardii (PROBIOTIC) 250 MG CAPS 1 capsule Patient not taking: Reported on 04/12/2021    [provider]  tamoxifen (NOLVADEX) 20 MG tablet Take 20 mg by mouth daily. Patient not taking: Reported on 04/12/2021    [provider]  vitamin B-12 (CYANOCOBALAMIN) 1000 MCG tablet Take 1,000 mcg by mouth daily.    [provider]  vitamin C (ASCORBIC ACID) 500 MG tablet Take 500 mg by mouth daily.    [provider]    Allergies    Gabapentin  Review of Systems   Review of Systems Ten systems reviewed and are negative for acute change, except as noted in the HPI.   Physical Exam Updated Vital Signs BP (!) 137/93 (BP Location: Left Arm)   Pulse (!) 111   Temp 98.4 F (36.9 C) (Oral)   Resp 19   Ht 5\' 1"  (1.549 m)   Wt 65.2 kg   SpO2 96%   BMI 27.17 kg/m   Physical Exam Vitals and nursing note reviewed.  Constitutional:      General: She is not in  acute distress.    Appearance: She is well-developed. She is not diaphoretic.  HENT:     Head: Normocephalic and atraumatic.  Eyes:     General: No scleral icterus.    Conjunctiva/sclera: Conjunctivae normal.  Cardiovascular:     Rate and Rhythm: Tachycardia present. Rhythm regularly irregular.     Heart sounds: Normal heart sounds. No murmur heard. No friction rub. No  gallop.   Pulmonary:     Effort: Pulmonary effort is normal. No respiratory distress.     Breath sounds: Normal breath sounds.  Abdominal:     General: Bowel sounds are normal. There is no distension.     Palpations: Abdomen is soft. There is no mass.     Tenderness: There is no abdominal tenderness. There is no guarding.  Musculoskeletal:     Cervical back: Normal range of motion.  Skin:    General: Skin is warm and dry.  Neurological:     Mental Status: She is alert and oriented to person, place, and time.  Psychiatric:        Behavior: Behavior normal.     ED Results / Procedures / Treatments   Labs (all labs ordered are listed, but only abnormal results are displayed) Labs Reviewed  BASIC METABOLIC PANEL - Abnormal; Notable for the following components:      Result Value   Glucose, Bld 298 (*)    All other components within normal limits  RESP PANEL BY RT-PCR (FLU A&B, COVID) ARPGX2  MAGNESIUM  CBC  TSH  HEMOGLOBIN A1C  HEPARIN LEVEL (UNFRACTIONATED)    EKG EKG Interpretation  Date/Time:  Friday April 14 2021 11:13:25 EDT Ventricular Rate:  134 PR Interval:    QRS Duration: 74 QT Interval:  304 QTC Calculation: 454 R Axis:   26 Text Interpretation: Atrial fibrillation with rapid ventricular response Non-specific ST-t changes Confirmed by Lajean Saver [phone]) on 04/14/2021 11:51:44 AM   Radiology DG Chest Port 1 View  Result Date: 04/14/2021 CLINICAL DATA:  Atrial fibrillation. Ductal carcinoma in-situ of the left upper outer breast. EXAM: PORTABLE CHEST 1 VIEW COMPARISON:  Chest  radiograph 11/27/2011 FINDINGS: Stable mild enlargement of the cardiopericardial silhouette, without edema. Atherosclerotic calcification of the aortic arch. Reverse lordotic projection. Suspected airway thickening in the lower lobes, which could reflect bronchitis or reactive airways disease. No blunting of the costophrenic angles. Prior right mastectomy. IMPRESSION: 1. Chronic mild enlargement of the cardiopericardial silhouette, without edema. 2. Suspected mild airway thickening in the lower lobes, potentially from bronchitis or reactive airways. 3.  Aortic Atherosclerosis (ICD10-I70.0). Electronically Signed   By: Van Clines M.D.   On: 04/14/2021 12:20    Procedures .Critical Care Performed by: Margarita Mail, PA-C Authorized by: Margarita Mail, PA-C   Critical care provider statement:    Critical care time (minutes):  45   Critical care time was exclusive of:  Separately billable procedures and treating other patients   Critical care was necessary to treat or prevent imminent or life-threatening deterioration of the following conditions:  Cardiac failure   Critical care was time spent personally by me on the following activities:  Discussions with consultants, evaluation of patient's response to treatment, examination of patient, ordering and performing treatments and interventions, ordering and review of laboratory studies, ordering and review of radiographic studies, pulse oximetry, re-evaluation of patient's condition, obtaining history from patient or surrogate and review of old charts     Medications Ordered in ED Medications  diltiazem (CARDIZEM) 1 mg/mL load via infusion 20 mg (20 mg Intravenous Bolus from Bag 04/14/21 1400)    And  diltiazem (CARDIZEM) 125 mg in dextrose 5% 125 mL (1 mg/mL) infusion (5 mg/hr Intravenous Infusion Verify 04/14/21 1551)  heparin ADULT infusion 100 units/mL (25000 units/259mL) (900 Units/hr Intravenous Infusion Verify 04/14/21 1551)    And   heparin bolus via infusion 3,000 Units (3,000 Units Intravenous Bolus from Bag 04/14/21 1503)  acetaminophen (TYLENOL) tablet 650 mg (has no administration in time range)  ondansetron (ZOFRAN) injection 4 mg (has no administration in time range)  magnesium sulfate IVPB 1 g 100 mL ( Intravenous Infusion Verify 04/14/21 1551)  insulin aspart (novoLOG) injection 0-9 Units (has no administration in time range)  insulin aspart (novoLOG) injection 0-5 Units (has no administration in time range)  diltiazem (CARDIZEM) injection 10 mg (10 mg Intravenous Given 04/14/21 1151)    ED Course  I have reviewed the triage vital signs and the nursing notes.  Pertinent labs & imaging results that were available during my care of the patient were reviewed by me and considered in my medical decision making (see chart for details).  Clinical Course as of 04/14/21 1553  Fri Apr 14, 2021  1126 Pt here w new onset afib. She is asymptomatic an no known onset. I have ordered labs and imaging. She is not a candidate for cardioversion. [AH]  1552 Repeat EKG [AH]    Clinical Course User Index [AH] Margarita Mail, PA-C   MDM Rules/Calculators/A&P     CHA2DS2-VASc Score: 7                     JJ:OACZ VS:  Vitals:   04/14/21 1400 04/14/21 1415 04/14/21 1430 04/14/21 1513  BP: 117/90 119/61 (!) 137/93   Pulse: 81 (!) 59 (!) 111   Resp: (!) 30 (!) 31 19   Temp:   98.4 F (36.9 C)   TempSrc:   Oral   SpO2: 98% 98% 96%   Weight:    65.2 kg  Height:    5\' 1"  (1.549 m)    YS:AYTKZSW is gathered by patient  and daughter. Previous records obtained and reviewed. FUX:NATFTDD in new onset afib w/ rvr asymptomatic. DDX includes idiopathic,  underlying infection, less likely alcohol ingestion, MI or PE . Labs: I ordered reviewed and interpreted labs which included CBC without abnormality, magnesium within normal limits, BMP shows mildly elevated glucose of 298 COVID panel is negative. Imaging: I ordered and reviewed  images which included portable 1 view chest x-ray. I independently visualized and interpreted all imaging. There are no acute, significant findings on today's images. EKG: EKG shows A. fib with RVR at a rate of 134.  Repeat EKG shows A. fib at a rate of 105 with improved rate Consults: Discussed patient with Dr. Marlou Porch discussed anticoagulation therapy and given the fact that she is scheduled for surgery this coming Tuesday have opted for heparin instead of DOAC.  She may begin this after surgery.  I have also discussed the patient with Dr. Tamala Julian of [facility] MDM: Patient here with new onset asymptomatic a fib.  Patient still in atrial rapid rate and needing Cardizem drip.  Patient will be admitted by the hospitalist service.  Have started her on heparin for anticoagulation.  Patient disposition:The patient appears reasonably stabilized for admission considering the current resources, flow, and capabilities available in the ED at this time, and I doubt any other [facility] requiring further screening and/or treatment in the ED prior to admission.        Final Clinical Impression(s) / ED Diagnoses Final diagnoses:  Atrial fibrillation with RVR [facility])    Rx / DC Orders ED Discharge Orders         Ordered    Amb referral to AFIB Clinic        04/14/21 1124           Margarita Mail, [place]  04/14/21 1556    Lajean Saver, MD 04/15/21 1317

## 2021-04-14 NOTE — Progress Notes (Signed)
ANTICOAGULATION CONSULT NOTE - Initial Consult  Pharmacy Consult for heparin dosing. Indication: atrial fibrillation  Allergies  Allergen Reactions  . Gabapentin     Other reaction(s): drowsiness    Patient Measurements: Height: 5\' 1"  (154.9 cm) Weight: 64.8 kg (142 lb 13.7 oz) IBW/kg (Calculated) : 47.8 Heparin Dosing Weight: 61.3  Vital Signs: Temp: 98.4 F (36.9 C) (04/29 1116) Temp Source: Oral (04/29 1116) BP: 117/90 (04/29 1400) Pulse Rate: 81 (04/29 1400)  Labs: Recent Labs    04/14/21 1140 04/14/21 1308  HGB 13.0  --   HCT 40.6  --   PLT 267  --   CREATININE 0.92 0.95    Estimated Creatinine Clearance: 38 mL/min (by C-G formula based on SCr of 0.95 mg/dL).   Medical History: Past Medical History:  Diagnosis Date  . Arthritis   . Breast cancer ([place]) 1999   right mastectomy  . Breast cancer, left ([place]) 05/2019   left lumpectomy  . Diabetes mellitus   . Dislocation of left shoulder joint   . Family history of prostate cancer   . Hyperlipidemia   . Hypertension   . Memory loss   . Numbness and tingling of right lower extremity   . Paresthesia   . Stroke [facility])    light stroke - 2012 ,right leg nerve damage      Assessment: 86 y.o. female presenting with new onset afib. Not on AC PTA. CBC wnl. Pharmacy consulted to dose IV heparin.   Goal of Therapy:  Heparin level 0.3-0.7 units/ml Monitor platelets by anticoagulation protocol: Yes   Plan:  Give 3000 units bolus x 1 Start heparin infusion at 900 units/hr Check anti-Xa level in 8 hours and daily while on heparin Continue to monitor H&H and platelets  Claudina Lick, PharmD PGY1 [place] Resident 04/14/2021 2:12 PM  Please check [email or web address] for unit-specific pharmacy phone numbers.

## 2021-04-15 ENCOUNTER — Observation Stay (HOSPITAL_BASED_OUTPATIENT_CLINIC_OR_DEPARTMENT_OTHER): Payer: Medicare PPO

## 2021-04-15 DIAGNOSIS — Z170 Estrogen receptor positive status [ER+]: Secondary | ICD-10-CM | POA: Diagnosis not present

## 2021-04-15 DIAGNOSIS — E118 Type 2 diabetes mellitus with unspecified complications: Secondary | ICD-10-CM | POA: Diagnosis not present

## 2021-04-15 DIAGNOSIS — I4891 Unspecified atrial fibrillation: Secondary | ICD-10-CM

## 2021-04-15 DIAGNOSIS — C50912 Malignant neoplasm of unspecified site of left female breast: Secondary | ICD-10-CM | POA: Diagnosis not present

## 2021-04-15 DIAGNOSIS — I10 Essential (primary) hypertension: Secondary | ICD-10-CM | POA: Diagnosis not present

## 2021-04-15 LAB — ECHOCARDIOGRAM COMPLETE
AR max vel: 1.89 cm2
AV Area VTI: 2.83 cm2
AV Area mean vel: 2.04 cm2
AV Mean grad: 5 mmHg
AV Peak grad: 8.6 mmHg
Ao pk vel: 1.47 m/s
Area-P 1/2: 2.32 cm2
Height: 61 in
MV VTI: 2.12 cm2
S' Lateral: 1.9 cm
Weight: 2372.8 oz

## 2021-04-15 LAB — CBC
HCT: 32.9 % — ABNORMAL LOW (ref 36.0–46.0)
Hemoglobin: 11.1 g/dL — ABNORMAL LOW (ref 12.0–15.0)
MCH: 29.5 pg (ref 26.0–34.0)
MCHC: 33.7 g/dL (ref 30.0–36.0)
MCV: 87.5 fL (ref 80.0–100.0)
Platelets: 222 10*3/uL (ref 150–400)
RBC: 3.76 MIL/uL — ABNORMAL LOW (ref 3.87–5.11)
RDW: 15.1 % (ref 11.5–15.5)
WBC: 6.6 10*3/uL (ref 4.0–10.5)
nRBC: 0 % (ref 0.0–0.2)

## 2021-04-15 LAB — COMPREHENSIVE METABOLIC PANEL
ALT: 11 U/L (ref 0–44)
AST: 15 U/L (ref 15–41)
Albumin: 2.7 g/dL — ABNORMAL LOW (ref 3.5–5.0)
Alkaline Phosphatase: 39 U/L (ref 38–126)
Anion gap: 7 (ref 5–15)
BUN: 15 mg/dL (ref 8–23)
CO2: 26 mmol/L (ref 22–32)
Calcium: 9 mg/dL (ref 8.9–10.3)
Chloride: 108 mmol/L (ref 98–111)
Creatinine, Ser: 0.85 mg/dL (ref 0.44–1.00)
GFR, Estimated: >60 mL/min (ref >60)
Glucose, Bld: 109 mg/dL — ABNORMAL HIGH (ref 70–99)
Potassium: 3.7 mmol/L (ref 3.5–5.1)
Sodium: 141 mmol/L (ref 135–145)
Total Bilirubin: 0.7 mg/dL (ref 0.3–1.2)
Total Protein: 5.5 g/dL — ABNORMAL LOW (ref 6.5–8.1)

## 2021-04-15 LAB — HEPARIN LEVEL (UNFRACTIONATED): Heparin Unfractionated: 0.34 IU/mL (ref 0.30–0.70)

## 2021-04-15 LAB — MAGNESIUM: Magnesium: 1.9 mg/dL (ref 1.7–2.4)

## 2021-04-15 MED ORDER — MAGNESIUM SULFATE 2 GM/50ML IV SOLN
2.0000 g | Freq: Once | INTRAVENOUS | Status: AC
  Administered 2021-04-15: 2 g via INTRAVENOUS
  Filled 2021-04-15: qty 50

## 2021-04-15 MED ORDER — APIXABAN 5 MG PO TABS: 5.0000 mg | ORAL_TABLET | Freq: Two times a day (BID) | ORAL | 1 refills | Status: DC

## 2021-04-15 MED ORDER — DILTIAZEM HCL ER COATED BEADS 180 MG PO CP24
180.0000 mg | ORAL_CAPSULE | Freq: Every day | ORAL | Status: DC
  Administered 2021-04-15: 180 mg via ORAL
  Filled 2021-04-15: qty 1

## 2021-04-15 MED ORDER — DILTIAZEM HCL ER COATED BEADS 180 MG PO CP24: 180.0000 mg | ORAL_CAPSULE | Freq: Every day | ORAL | 1 refills | Status: DC

## 2021-04-15 MED ORDER — APIXABAN 5 MG PO TABS
5.0000 mg | ORAL_TABLET | Freq: Two times a day (BID) | ORAL | Status: DC
  Administered 2021-04-15: 5 mg via ORAL
  Filled 2021-04-15: qty 1

## 2021-04-15 MED ORDER — POTASSIUM CHLORIDE CRYS ER 20 MEQ PO TBCR
40.0000 meq | EXTENDED_RELEASE_TABLET | Freq: Once | ORAL | Status: AC
  Administered 2021-04-15: 40 meq via ORAL
  Filled 2021-04-15: qty 2

## 2021-04-15 MED ORDER — MELOXICAM 15 MG PO TABS: 15.0000 mg | ORAL_TABLET | Freq: Every day | ORAL | Status: DC | PRN

## 2021-04-15 NOTE — Care Management (Signed)
Provided patient with Eliquis card. No other CM needs identified

## 2021-04-15 NOTE — Discharge Instructions (Signed)

## 2021-04-15 NOTE — Discharge Summary (Signed)
Physician Discharge Summary  Amanda Green WJX:[id number] DOB: 04/18/1935  PCP: Alroy Dust, L.[name], MD  Admitted from: Home Discharged to: Home  Admit date: 04/14/2021 Discharge date: 04/15/2021  Recommendations for Outpatient Follow-up:    Follow-up Information    Abigail Butts., PA-C Follow up on 04/27/2021.   Specialties: Physician Assistant, Cardiology Why: Please arrive 15 minutes early for your 2:15 pm post-hospital cardiology appointment Contact information: [address] [place] [address] [phone]        Alroy Dust, L.[name], MD. Schedule an appointment as soon as possible for a visit in 1 week(s).   Specialty: Family Medicine Why: To be seen with repeat labs (CBC & BMP). Contact information: [address] [place] [address] [phone]                Home Health: None    Equipment/Devices: None    Discharge Condition: Improved and stable.   Code Status: Full Code Diet recommendation:  Discharge Diet Orders (From admission, onward)    Start     Ordered   04/15/21 0000  Diet - low sodium heart healthy        04/15/21 1407   04/15/21 0000  Diet Carb Modified        04/15/21 1407           Discharge Diagnoses:  Principal Problem:   New onset atrial fibrillation [facility]) Active Problems:   Breast cancer ([place])   Essential hypertension   Diabetes mellitus type 2, controlled ([place])   Brief Summary: 86 year old female, lives alone, IADL, medical history significant for but not limited to HTN, HLD, type II DM, CVA without residual deficit, memory loss, breast cancer s/p right mastectomy 1999 with more recent recurrent disease in 2020 s/p lumpectomy with anticipated left mastectomy 05/10/2021, went to lab for preoperative blood work and noted to be tachycardic with heart rates in the 140s, patient asymptomatic and was advised to go to the ED.  In the ED, noted to be in A. fib with RVR.  She was started on IV  Cardizem drip and heparin drip.  She converted to sinus rhythm overnight.  Cardiology was consulted, transitioned her to oral Cardizem and Eliquis and cleared her for discharge home.  Detailed HPI and ED course as per Dr. Fuller Plan on 04/14/2021 as copied below:  " HPI: Amanda Green is a 86 y.o. female with medical history significant of hypertension, hyperlipidemia, CVA without residual deficit, DM type II, breast cancer s/p right masectomy 1999, and memory loss who presents after being advised to come to the emergency department for fast heart rate.  She had gone into do routine lab work prior to her scheduled left mastectomy on 5/3 and was found to have a irregular and elevated heart rate into the 140s.  She was sent to the hospital for further evaluation.  Patient denies having any symptoms of chest pain, palpitations, shortness of breath, cough, leg swelling, vomiting, diarrhea, or dysuria.  Family present at bedside notes that the patient had been complaining of some nausea and had appeared to be fatigued.  ED Course: Upon admission into the emergency department patient was seen to be afebrile with pulse elevated up to 142 and atrial fibrillation, respiration 25-32, and all other vital signs maintained.  Labs significant for glucose 272, TSH 2.74, and magnesium 1.9.  Chest x-ray significant for chronic mild enlargement of the cardiac silhouette without edema and mild airway thickening in the lower lobes.  Patient had been given 10 mg of diltiazem IV once with some improvement in heart rates only temporarily.  Patient was started on heparin drip as well as a Cardizem drip.  TRH called to admit."  Assessment and plan:  1. New onset A. fib with RVR: Relatively asymptomatic except some nausea.  EKG on admission confirmed new onset A. fib with RVR in the 130s.  Started on IV Cardizem drip and IV heparin drip for stroke prophylaxis.  She converted to sinus rhythm overnight.  CHA2DS2-VASc score:  7.  2D echo shows LVEF >70% with hyperdynamic LV and severe LVH.  TSH normal. Cardiology consultation appreciated.  Communicated with Dr. Sallyanne Kuster.  Patient has been transitioned to oral Cardizem CD 180 mg daily and Eliquis 5 mg twice daily and has received the first dose.  As per Dr. Sallyanne Kuster, okay to interrupt anticoagulation for 2 days before upcoming breast surgery and she does not require "bridging".  Please refer to his detailed consult note for preop clearance.  He indicates that she is at low to moderate risk of complications for the upcoming breast surgery.  There is clearly a risk of atrial fibrillation with RVR but she has tolerated the arrhythmia very well and he sees no reason to delay her planned breast surgery for additional work-up.  Amlodipine discontinued.  Felt to have hypertensive heart disease as cause of LVH and A. fib rather than infiltrative disorder such as Amyloidosis.  Cardiology has arranged outpatient follow-up prior to surgery to ensure adequate rate control.  I discussed in detail with patient and her granddaughter advising them that aspirin has been discontinued due to bleeding risk and also strongly recommended that she not take Mobic at all if not indicated in if she does have to take it then take it minimally and only as needed again due to bleeding risk.  They verbalized understanding. 2. Essential hypertension: Since patient started on Cardizem CD 180 mg daily, amlodipine was discontinued.  As per my communication with Dr. Sallyanne Kuster, continue prior home dose of HCTZ and lisinopril.  Controlled. 3. Type II DM: A1c 6.8.  Continue prior home medications and close outpatient follow-up with her PCP. 4. History of CVA without residual deficits: Occurred in 2012.  Was on aspirin and statins at home.  Aspirin discontinued following initiation of Eliquis.  Continue statins. 5. Breast cancer: Patient with prior history of breast cancer s/p right mastectomy in 1999. Patient found to have  invasive ductal carcinoma of the left breast in 2020 status postlumpectomy with radiation seed placement. Left breast biopsy showed ductal carcinoma in situ for which patient is planning mastectomy initially scheduled for 5/3. However, due to patient hospitalization was rescheduled for 5/25. Follows with Dr. Jana Hakim, oncology.  Cardiology will forward their consult note to the surgeon and I will forward the discharge summary as well. 6. Hyperlipidemia: Continue statins. 7. Anemia: Hemoglobin dropped from 13 on admission to 11.1 in the absence of overt bleeding.  Could be hemodilution.  Follow CBC closely as outpatient.   Consultations:  Cardiology  Procedures:  None   Discharge Instructions  Discharge Instructions    Amb referral to AFIB Clinic   Complete by: As directed    Call MD for:   Complete by: As directed    Racing of the heart or palpitations.   Call MD for:  difficulty breathing, headache or visual disturbances   Complete by: As directed    Call MD for:  extreme fatigue   Complete by: As directed  Call MD for:  persistant dizziness or light-headedness   Complete by: As directed    Diet - low sodium heart healthy   Complete by: As directed    Diet Carb Modified   Complete by: As directed    Increase activity slowly   Complete by: As directed        Medication List    STOP taking these medications   amLODipine 10 MG tablet Commonly known as: NORVASC   aspirin EC 81 MG tablet     TAKE these medications   apixaban 5 MG Tabs tablet Commonly known as: ELIQUIS Take 1 tablet (5 mg total) by mouth 2 (two) times daily.   atorvastatin 80 MG tablet Commonly known as: LIPITOR Take 80 mg by mouth daily.   brimonidine-timolol 0.2-0.5 % ophthalmic solution Commonly known as: COMBIGAN Place 1 drop into both eyes every 12 (twelve) hours.   calcium carbonate 1500 (600 Ca) MG Tabs tablet Commonly known as: OSCAL Take 1,500 mg by mouth 2 (two) times daily with a  meal.   diltiazem 180 MG 24 hr capsule Commonly known as: CARDIZEM CD Take 1 capsule (180 mg total) by mouth daily. Start taking on: Apr 16, 2021   dorzolamide 2 % ophthalmic solution Commonly known as: TRUSOPT Place 1 drop into both eyes daily.   hydrochlorothiazide 12.5 MG capsule Commonly known as: MICROZIDE Take 12.5 mg by mouth daily.   latanoprost 0.005 % ophthalmic solution Commonly known as: XALATAN Place 1 drop into both eyes at bedtime.   lisinopril 20 MG tablet Commonly known as: ZESTRIL Take 20 mg by mouth daily.   meloxicam 15 MG tablet Commonly known as: MOBIC Take 1 tablet (15 mg total) by mouth daily as needed for pain (To be taken only if needed.). What changed:   when to take this  reasons to take this   metFORMIN 500 MG tablet Commonly known as: GLUCOPHAGE Take 1,000 mg by mouth 2 (two) times daily with a meal.   pregabalin 50 MG capsule Commonly known as: LYRICA Take 1 capsule (50 mg total) by mouth 3 (three) times daily.   vitamin B-12 1000 MCG tablet Commonly known as: CYANOCOBALAMIN Take 1,000 mcg by mouth daily.   vitamin C 500 MG tablet Commonly known as: ASCORBIC ACID Take 500 mg by mouth daily.      Allergies  Allergen Reactions  . Gabapentin     Other reaction(s): drowsiness      Procedures/Studies: DG Chest Port 1 View  Result Date: 04/14/2021 CLINICAL DATA:  Atrial fibrillation. Ductal carcinoma in-situ of the left upper outer breast. EXAM: PORTABLE CHEST 1 VIEW COMPARISON:  Chest radiograph 11/27/2011 FINDINGS: Stable mild enlargement of the cardiopericardial silhouette, without edema. Atherosclerotic calcification of the aortic arch. Reverse lordotic projection. Suspected airway thickening in the lower lobes, which could reflect bronchitis or reactive airways disease. No blunting of the costophrenic angles. Prior right mastectomy. IMPRESSION: 1. Chronic mild enlargement of the cardiopericardial silhouette, without edema. 2.  Suspected mild airway thickening in the lower lobes, potentially from bronchitis or reactive airways. 3.  Aortic Atherosclerosis (ICD10-I70.0). Electronically Signed   By: Van Clines M.D.   On: 04/14/2021 12:20    ECHOCARDIOGRAM COMPLETE  Result Date: 04/15/2021    ECHOCARDIOGRAM REPORT   Patient Name:   Amanda Green Date of Exam: 04/15/2021 Medical Rec #:  [id number]          Height:       61.0 in Accession #:  [id number]         Weight:       148.3 lb Date of Birth:  [birth year]          BSA:          1.663 m Patient Age:    39 years           BP:           155/78 mmHg Patient Gender: F                  HR:           70 bpm. Exam Location:  Inpatient Procedure: 2D Echo, Cardiac Doppler and Color Doppler Indications:    Atrial fibrillation  History:        Patient has prior history of Echocardiogram examinations, most                 recent 11/17/2009. Risk Factors:Hypertension, Dyslipidemia and                 Diabetes. Cancer of left breast. H/O cancer of right breast, s/p                 right mastectomy. H/O CVA.  Sonographer:    Clayton Lefort RDCS (AE) Referring Phys: [id number] Karly[address] Glasco D [place]  1. Consider infiltrative process such as amyloidosis due to severe LV wall thickening. Left ventricular ejection fraction, by estimation, is >75%. The left ventricle has hyperdynamic function. The left ventricle has no regional wall motion abnormalities. There is severe left ventricular hypertrophy. Left ventricular diastolic parameters are consistent with Grade I diastolic dysfunction (impaired relaxation). Elevated left ventricular end-diastolic pressure.  2. Right ventricular systolic function is normal. The right ventricular size is normal. There is normal pulmonary artery systolic pressure. The estimated right ventricular systolic pressure is 0000000 mmHg.  3. Left atrial size was mildly dilated.  4. The pericardial effusion is posterior to the left ventricle. Trivial pericardial effusion.  5.  The mitral valve is abnormal. Trivial mitral valve regurgitation.  6. The aortic valve is tricuspid. Aortic valve regurgitation is not visualized. Mild aortic valve sclerosis is present, with no evidence of aortic valve stenosis.  7. The inferior vena cava is normal in size with greater than 50% respiratory variability, suggesting right atrial pressure of 3 mmHg. Comparison(s): Unable to locate echo for comparison. FINDINGS  Left Ventricle: Consider infiltrative process such as amyloidosis due to severe LV wall thickening. Left ventricular ejection fraction, by estimation, is >75%. The left ventricle has hyperdynamic function. The left ventricle has no regional wall motion abnormalities. The left ventricular internal cavity size was normal in size. There is severe left ventricular hypertrophy. Left ventricular diastolic parameters are consistent with Grade I diastolic dysfunction (impaired relaxation). Elevated left ventricular end-diastolic pressure. Right Ventricle: The right ventricular size is normal. No increase in right ventricular wall thickness. Right ventricular systolic function is normal. There is normal pulmonary artery systolic pressure. The tricuspid regurgitant velocity is 2.73 m/s, and  with an assumed right atrial pressure of 3 mmHg, the estimated right ventricular systolic pressure is 0000000 mmHg. Left Atrium: Left atrial size was mildly dilated. Right Atrium: Right atrial size was normal in size. Pericardium: Trivial pericardial effusion is present. The pericardial effusion is posterior to the left ventricle. Mitral Valve: The mitral valve is abnormal. There is mild thickening of the mitral valve leaflet(s). Mild mitral annular calcification. Trivial mitral valve regurgitation. MV peak gradient, 4.8 mmHg. The mean mitral  valve gradient is 2.0 mmHg. Tricuspid Valve: The tricuspid valve is grossly normal. Tricuspid valve regurgitation is trivial. Aortic Valve: The aortic valve is tricuspid. Aortic  valve regurgitation is not visualized. Mild aortic valve sclerosis is present, with no evidence of aortic valve stenosis. Aortic valve mean gradient measures 5.0 mmHg. Aortic valve peak gradient measures 8.6 mmHg. Aortic valve area, by VTI measures 2.83 cm. Pulmonic Valve: The pulmonic valve was grossly normal. Pulmonic valve regurgitation is not visualized. Aorta: The aortic root and ascending aorta are structurally normal, with no evidence of dilitation. Venous: The inferior vena cava is normal in size with greater than 50% respiratory variability, suggesting right atrial pressure of 3 mmHg. IAS/Shunts: No atrial level shunt detected by color flow Doppler.  LEFT VENTRICLE PLAX 2D LVIDd:         3.40 cm  Diastology LVIDs:         1.90 cm  LV e' medial:    2.82 cm/s LV PW:         1.90 cm  LV E/e' medial:  25.2 LV IVS:        1.90 cm  LV e' lateral:   5.22 cm/s LVOT diam:     1.80 cm  LV E/e' lateral: 13.6 LV SV:         64 LV SV Index:   38 LVOT Area:     2.54 cm  RIGHT VENTRICLE             IVC RV Basal diam:  3.90 cm     IVC diam: 1.40 cm RV Mid diam:    2.60 cm RV S prime:     13.80 cm/s TAPSE (M-mode): 2.3 cm LEFT ATRIUM             Index       RIGHT ATRIUM           Index LA diam:        2.90 cm 1.74 cm/m  RA Area:     14.40 cm LA Vol (A2C):   54.0 ml 32.47 ml/m RA Volume:   32.30 ml  19.42 ml/m LA Vol (A4C):   57.1 ml 34.33 ml/m LA Biplane Vol: 56.9 ml 34.21 ml/m  AORTIC VALVE AV Area (Vmax):    1.89 cm AV Area (Vmean):   2.04 cm AV Area (VTI):     2.83 cm AV Vmax:           147.00 cm/s AV Vmean:          98.600 cm/s AV VTI:            0.225 m AV Peak Grad:      8.6 mmHg AV Mean Grad:      5.0 mmHg LVOT Vmax:         109.00 cm/s LVOT Vmean:        79.200 cm/s LVOT VTI:          0.250 m LVOT/AV VTI ratio: 1.11  AORTA Ao Root diam: 3.20 cm Ao Asc diam:  3.10 cm MITRAL VALVE               TRICUSPID VALVE MV Area (PHT): 2.32 cm    TR Peak grad:   29.8 mmHg MV Area VTI:   2.12 cm    TR Vmax:         273.00 cm/s MV Peak grad:  4.8 mmHg MV Mean grad:  2.0 mmHg    SHUNTS MV Vmax:  1.10 m/s    Systemic VTI:  0.25 m MV Vmean:      64.3 cm/s   Systemic Diam: 1.80 cm MV Decel Time: 327 msec MV E velocity: 71.10 cm/s MV A velocity: 97.70 cm/s MV E/A ratio:  0.73 Lyman Bishop MD Electronically signed by Lyman Bishop MD Signature Date/Time: 04/15/2021/12:16:06 PM    Final      Subjective: Patient seen this morning.  Denied complaints.  No palpitations, dizziness, lightheadedness, chest pain or dyspnea.  No further nausea.  Anxious for discharge.  Discharge Exam:  Vitals:   04/15/21 0500 04/15/21 0748 04/15/21 1107 04/15/21 1346  BP: 115/64 (!) 155/78 (!) 143/87 (!) 149/74  Pulse: 66 70 77   Resp: 16 20 20    Temp: 98.1 F (36.7 C) 98 F (36.7 C) 98.2 F (36.8 C)   TempSrc: Oral Oral Oral   SpO2:  99% 96%   Weight:      Height:        General: Pleasant elderly female, moderately built and nourished lying comfortably propped up in bed without distress. Cardiovascular: S1 & S2 heard, RRR, S1/S2 +. No murmurs, rubs, gallops or clicks. No JVD or pedal edema.  Telemetry personally reviewed: A. fib reverted to sinus rhythm at approximately 7:50 PM on 4/29. Respiratory: Clear to auscultation without wheezing, rhonchi or crackles. No increased work of breathing. Abdominal:  Non distended, non tender & soft. No organomegaly or masses appreciated. Normal bowel sounds heard. CNS: Alert and oriented. No focal deficits. Extremities: no edema, no cyanosis    The results of significant diagnostics from this hospitalization (including imaging, microbiology, ancillary and laboratory) are listed below for reference.     Microbiology: Recent Results (from the past 240 hour(s))  Resp Panel by RT-PCR (Flu A&B, Covid) Nasopharyngeal Swab     Status: None   Collection Time: 04/14/21  2:00 PM   Specimen: Nasopharyngeal Swab; Nasopharyngeal(NP) swabs in vial transport medium  Result Value Ref Range  Status   SARS Coronavirus 2 by RT PCR NEGATIVE NEGATIVE Final    Comment: (NOTE) SARS-CoV-2 target nucleic acids are NOT DETECTED.  The SARS-CoV-2 RNA is generally detectable in upper respiratory specimens during the acute phase of infection. The lowest concentration of SARS-CoV-2 viral copies this assay can detect is 138 copies/mL. A negative result does not preclude SARS-Cov-2 infection and should not be used as the sole basis for treatment or other patient management decisions. A negative result may occur with  improper specimen collection/handling, submission of specimen other than nasopharyngeal swab, presence of viral mutation(s) within the areas targeted by this assay, and inadequate number of viral copies(<138 copies/mL). A negative result must be combined with clinical observations, patient history, and epidemiological information. The expected result is Negative.  Fact Sheet for Patients:  EntrepreneurPulse.com.au  Fact Sheet for Healthcare Providers:  IncredibleEmployment.be  This test is no t yet approved or cleared by the [place] FDA and  has been authorized for detection and/or diagnosis of SARS-CoV-2 by FDA under an Emergency Use Authorization (EUA). This EUA will remain  in effect (meaning this test can be used) for the duration of the COVID-19 declaration under Section 564(b)(1) of the Act, 21 U.S.C.section 360bbb-3(b)(1), unless the authorization is terminated  or revoked sooner.       Influenza A by PCR NEGATIVE NEGATIVE Final   Influenza B by PCR NEGATIVE NEGATIVE Final    Comment: (NOTE) The Xpert Xpress SARS-CoV-2/FLU/RSV plus assay is intended as an aid in the diagnosis  of influenza from Nasopharyngeal swab specimens and should not be used as a sole basis for treatment. Nasal washings and aspirates are unacceptable for Xpert Xpress SARS-CoV-2/FLU/RSV testing.  Fact Sheet for  Patients: EntrepreneurPulse.com.au  Fact Sheet for Healthcare Providers: IncredibleEmployment.be  This test is not yet approved or cleared by the [place] FDA and has been authorized for detection and/or diagnosis of SARS-CoV-2 by FDA under an Emergency Use Authorization (EUA). This EUA will remain in effect (meaning this test can be used) for the duration of the COVID-19 declaration under Section 564(b)(1) of the Act, 21 U.S.C. section 360bbb-3(b)(1), unless the authorization is terminated or revoked.  Performed at [facility], [place] [address], [place], [place] [address]      Labs: CBC: Recent Labs  Lab 04/14/21 1140 04/15/21 0346  WBC 6.8 6.6  HGB 13.0 11.1*  HCT 40.6 32.9*  MCV 91.2 87.5  PLT 267 [id number]    Basic Metabolic Panel: Recent Labs  Lab 04/14/21 1140 04/14/21 1308 04/15/21 0346  NA 139 140 141  K 4.2 4.6 3.7  CL 105 106 108  CO2 26 27 26   GLUCOSE 298* 272* 109*  BUN 16 15 15   CREATININE 0.92 0.95 0.85  CALCIUM 9.7 9.4 9.0  MG 1.9  --  1.9    Liver Function Tests: Recent Labs  Lab 04/15/21 0346  AST 15  ALT 11  ALKPHOS 39  BILITOT 0.7  PROT 5.5*  ALBUMIN 2.7*    CBG: Recent Labs  Lab 04/14/21 1609 04/14/21 2128  GLUCAP 217* 149*    Hgb A1c Recent Labs    04/14/21 1516  HGBA1C 6.8*     Thyroid function studies Recent Labs    04/14/21 1140  TSH 2.740   I discussed in detail with patient's granddaughter via phone, updated care and answered all questions.  Time coordinating discharge: 35 minutes  SIGNED:  Vernell Leep, MD, [place], [facility]. [facility]  To contact the attending provider between 7A-7P or the covering provider during after hours 7P-7A, please log into the web site [email or web address] and access using universal [place] password for that web site. If you do not have the password, please call the hospital operator.

## 2021-04-15 NOTE — Progress Notes (Signed)
Updated pt condition to DR Olevia Bowens via secured message, IV infusion diltiazem decrease to 3 mg/hr from 5 mg since HR= 65-80 bpm since 2000H with SBP=95-120 = 41mmhg, no further order was made

## 2021-04-15 NOTE — Progress Notes (Signed)
[place] for heparin dosing. Indication: atrial fibrillation  Allergies  Allergen Reactions  . Gabapentin     Other reaction(s): drowsiness    Patient Measurements: Height: 5\' 1"  (154.9 cm) Weight: 67.3 kg (148 lb 4.8 oz) IBW/kg (Calculated) : 47.8 Heparin Dosing Weight: 61.3  Vital Signs: Temp: 98.1 F (36.7 C) (04/30 0500) Temp Source: Oral (04/30 0500) BP: 115/64 (04/30 0500) Pulse Rate: 66 (04/30 0500)  Labs: Recent Labs    04/14/21 1140 04/14/21 1308 04/14/21 2238 04/15/21 0346  HGB 13.0  --   --  11.1*  HCT 40.6  --   --  32.9*  PLT 267  --   --  222  HEPARINUNFRC  --   --  0.45 0.34  CREATININE 0.92 0.95  --  0.85    Estimated Creatinine Clearance: 43.2 mL/min (by C-G formula based on SCr of 0.85 mg/dL).   Medical History: Past Medical History:  Diagnosis Date  . Arthritis   . Breast cancer ([place]) 1999   right mastectomy  . Breast cancer, left ([place]) 05/2019   left lumpectomy  . Diabetes mellitus   . Dislocation of left shoulder joint   . Family history of prostate cancer   . Hyperlipidemia   . Hypertension   . Memory loss   . Numbness and tingling of right lower extremity   . Paresthesia   . Stroke [facility])    light stroke - 2012 ,right leg nerve damage      Assessment: 86 y.o. female presenting with new onset afib. Not on AC PTA. CBC wnl. Pharmacy consulted to dose IV heparin.   Heparin level remains therapeutic on heparin 900 units/h. Hgb down to 11.1, plt wnl. No bleeding noted.   Goal of Therapy:  Heparin level 0.3-0.7 units/ml Monitor platelets by anticoagulation protocol: Yes   Plan:  Continue IV heparin 900 units/h Monitor daily heparin level, CBC, s/s bleeding F/u plans to transition to oral anticoagulation  Rebbeca Paul, PharmD PGY1 Pharmacy Resident 04/15/2021 7:23 AM  Please check [email or web address] for unit-specific pharmacy phone numbers.

## 2021-04-15 NOTE — Progress Notes (Signed)
  Echocardiogram 2D Echocardiogram has been performed.  Amanda Green 04/15/2021, 11:03 AM

## 2021-04-15 NOTE — Consult Note (Addendum)
Cardiology Consultation:   Patient ID: Amanda Green MRN: [id number]; DOB: 01/19/1935  Admit date: 04/14/2021 Date of Consult: 04/15/2021  PCP:  Alroy Dust, L.[name], [place]  Cardiologist:  New to [facility]; Dr. Sallyanne Kuster Advanced Practice Provider:  No care team member to display Electrophysiologist:  None   Patient Profile:   Amanda Green is a 86 y.o. female with a PMH of   HTN, HLD, DM type 2, CVA, memory loss, and breast cancer s/p right mastectomy in 1999 with more recent recurrent disease in 2020 s/p lumpectomy with anticipated left mastectomy 05/10/21, who is being seen today for the evaluation of new onset atrial fibrillation at the request of Dr. Algis Liming.  History of Present Illness:   Amanda Green was in her usual state of health until the afternoon of 04/14/21 when she presented for preoperative blood work and was found to have a HR elevated to 140s. She was asymptomatic at the time. HR was noted to be irregular and she was referred to the ED for further evaluation.   On arrival to the ED, HR was elevated to 140s. EKG confirmed atrial fibrillation with RVR with rate 133 with non-specific ST-T wave abnormalities. She was also tachypneic on arrival, otherwise VSS. Labs notable for electrolytes wnl, Cr 0.92, Hgb 13.0> 11.1 (likely dilutional), PLT 267, TSH wnl, A1C 6.8. She was started on a heparin gtt for stroke ppx and a diltiazem gtt for rate control. HR improved significantly and she converted to sinus rhythm around 7:48pm. Echo obtained with read pending. Cardiology asked to evaluate for new onset atrial fibrillation.  Patient was largely asymptomatic with her atrial fibrillation. Family reported some nausea and fatigue recently but no complaints of chest pain, SOB, or palpitations. She has no significant cardiac history and does not follow with cardiology outpatient.    Past Medical History:  Diagnosis Date  . Arthritis   . Breast  cancer ([place]) 1999   right mastectomy  . Breast cancer, left ([place]) 05/2019   left lumpectomy  . Diabetes mellitus   . Dislocation of left shoulder joint   . Family history of prostate cancer   . Hyperlipidemia   . Hypertension   . Memory loss   . Numbness and tingling of right lower extremity   . Paresthesia   . Stroke [facility])    light stroke - 2012 ,right leg nerve damage     Past Surgical History:  Procedure Laterality Date  . ABDOMINAL HYSTERECTOMY    . APPENDECTOMY    . BREAST BIOPSY Left 01/19/2010  . BREAST LUMPECTOMY WITH RADIOACTIVE SEED LOCALIZATION Left 07/17/2019   Procedure: LEFT BREAST LUMPECTOMY WITH RADIOACTIVE SEED LOCALIZATION;  Surgeon: Johnathan Hausen, MD;  Location: [facility];  Service: General;  Laterality: Left;  . BREAST SURGERY     Right mastectomy 1999  . CARPAL TUNNEL RELEASE Right 09/30/2014   Procedure: RIGHT CARPAL TUNNEL RELEASE;  Surgeon: Daryll Brod, MD;  Location: [place];  Service: Orthopedics;  Laterality: Right;  . COLONOSCOPY    . EYE SURGERY     cataracts  . JOINT REPLACEMENT     lt total knee 2011  . LIPOMA EXCISION  11/28/2011   Procedure: EXCISION LIPOMA;  Surgeon: Pedro Earls, MD;  Location: [place];  Service: General;  Laterality: N/A;  excisino lipoma 4 cm back of neck  . MASTECTOMY Right 1999     Home Medications:  Prior to Admission  medications   Medication Sig Start Date End Date Taking? Authorizing Provider  amLODipine (NORVASC) 10 MG tablet Take 10 mg by mouth daily.   Yes [provider]  aspirin EC 81 MG tablet Take 81 mg by mouth daily. Swallow whole.   Yes [provider]  atorvastatin (LIPITOR) 80 MG tablet Take 80 mg by mouth daily.   Yes [provider]  brimonidine-timolol (COMBIGAN) 0.2-0.5 % ophthalmic solution Place 1 drop into both eyes every 12 (twelve) hours.   Yes [provider]  calcium carbonate (OSCAL) 1500 (600 Ca) MG  TABS tablet Take 1,500 mg by mouth 2 (two) times daily with a meal.   Yes [provider]  dorzolamide (TRUSOPT) 2 % ophthalmic solution Place 1 drop into both eyes daily. 10/19/20  Yes [provider]  hydrochlorothiazide (MICROZIDE) 12.5 MG capsule Take 12.5 mg by mouth daily.   Yes [provider]  latanoprost (XALATAN) 0.005 % ophthalmic solution Place 1 drop into both eyes at bedtime. 11/13/15  Yes [provider]  lisinopril (ZESTRIL) 20 MG tablet Take 20 mg by mouth daily.   Yes [provider]  meloxicam (MOBIC) 15 MG tablet Take 15 mg by mouth daily.   Yes [provider]  metFORMIN (GLUCOPHAGE) 500 MG tablet Take 1,000 mg by mouth 2 (two) times daily with a meal.  08/18/11  Yes [provider]  pregabalin (LYRICA) 50 MG capsule Take 1 capsule (50 mg total) by mouth 3 (three) times daily. 02/07/21  Yes Sater, Nanine Means, MD  vitamin B-12 (CYANOCOBALAMIN) 1000 MCG tablet Take 1,000 mcg by mouth daily.   Yes [provider]  vitamin C (ASCORBIC ACID) 500 MG tablet Take 500 mg by mouth daily.   Yes [provider]    Inpatient Medications: Scheduled Meds: . atorvastatin  80 mg Oral Daily  . brimonidine  1 drop Both Eyes Q12H   And  . timolol  1 drop Both Eyes Q12H  . dorzolamide  1 drop Both Eyes Daily  . insulin aspart  0-5 Units Subcutaneous QHS  . insulin aspart  0-9 Units Subcutaneous TID WC  . latanoprost  1 drop Both Eyes QHS  . pregabalin  50 mg Oral TID   Continuous Infusions: . diltiazem (CARDIZEM) infusion 5 mg/hr (04/15/21 0815)  . heparin 900 Units/hr (04/14/21 1551)  . magnesium sulfate bolus IVPB 2 g (04/15/21 1125)   PRN Meds: acetaminophen, ondansetron (ZOFRAN) IV  Allergies:    Allergies  Allergen Reactions  . Gabapentin     Other reaction(s): drowsiness    Social History:   Social History   Socioeconomic History  . Marital status: Widowed    Spouse name: Not on file  .  Number of children: 5  . Years of education: HS - 12 years  . Highest education level: Not on file  Occupational History  . Occupation: Retired  Tobacco Use  . Smoking status: Never Smoker  . Smokeless tobacco: Never Used  Substance and Sexual Activity  . Alcohol use: No  . Drug use: No  . Sexual activity: Not on file  Other Topics Concern  . Not on file  Social History Narrative   Lives at home alone.   Right-handed.   1 cup caffeine daily.   Social Determinants of Health   Financial Resource Strain: Not on file  Food Insecurity: Not on file  Transportation Needs: Not on file  Physical Activity: Not on file  Stress: Not on file  Social Connections: Not on file  Intimate Partner Violence: Not on file    Family History:    Family History  Problem Relation Age of Onset  . Esophageal cancer Mother        smoker  . Other Father        Unsure of medical history.  . Prostate cancer Brother        d. 42s  . Cancer Son        ?? unsure, but may have had cancer  . Cancer Son        ?? unsure, but may have had cancer     ROS:  Please see the history of present illness.   All other ROS reviewed and negative.     Physical Exam/Data:   Vitals:   04/15/21 0400 04/15/21 0500 04/15/21 0748 04/15/21 1107  BP:  115/64 (!) 155/78 (!) 143/87  Pulse:  66 70 77  Resp: 16 16 20 20   Temp:  98.1 F (36.7 C) 98 F (36.7 C) 98.2 F (36.8 C)  TempSrc:  Oral Oral Oral  SpO2:   99% 96%  Weight: 67.3 kg     Height:        Intake/Output Summary (Last 24 hours) at 04/15/2021 1138 Last data filed at 04/15/2021 0742 Gross per 24 hour  Intake 841.37 ml  Output 300 ml  Net 541.37 ml   Last 3 Weights 04/15/2021 04/14/2021 04/14/2021  Weight (lbs) 148 lb 4.8 oz 143 lb 12.8 oz 142 lb 13.7 oz  Weight (kg) 67.268 kg 65.227 kg 64.8 kg     Body mass index is 28.02 kg/m.  General:  Well nourished, well developed, in no acute distress HEENT: sclera anicteric Neck: no JVD Vascular: No  carotid bruits; distal pulses 2+ bilaterally  Cardiac:  normal S1, S2; RRR; no murmurs, rubs, orgallops Lungs:  clear to auscultation bilaterally, no wheezing, rhonchi or rales  Abd: soft, nontender, no hepatomegaly  Ext: no edema Musculoskeletal:  No deformities, BUE and BLE strength normal and equal Skin: warm and dry  Neuro:  CNs 2-12 intact, no focal abnormalities noted Psych:  Normal affect   EKG:  The EKG was personally reviewed and demonstrates:  atrial fibrillation with RVR with rate 133 with non-specific ST-T wave abnormalities. Telemetry:  Telemetry was personally reviewed and demonstrates:  Initially atrial fibrillation with RVR with conversion to sinus rhythm around 7:48 pm 04/14/21. Occasional PVCs  Relevant CV Studies: Echocardiogram pending  Laboratory Data:  High Sensitivity Troponin:  No results for input(s): TROPONINIHS in the last 720 hours.   Chemistry Recent Labs  Lab 04/14/21 1140 04/14/21 1308 04/15/21 0346  NA 139 140 141  K 4.2 4.6 3.7  CL 105 106 108  CO2 26 27 26   GLUCOSE 298* 272* 109*  BUN 16 15 15   CREATININE 0.92 0.95 0.85  CALCIUM 9.7 9.4 9.0  GFRNONAA >60 59* >60  ANIONGAP 8 7 7     Recent Labs  Lab 04/15/21 0346  PROT 5.5*  ALBUMIN 2.7*  AST 15  ALT 11  ALKPHOS 39  BILITOT 0.7   Hematology Recent Labs  Lab 04/14/21 1140 04/15/21 0346  WBC 6.8 6.6  RBC 4.45 3.76*  HGB 13.0 11.1*  HCT 40.6 32.9*  MCV 91.2 87.5  MCH 29.2 29.5  MCHC 32.0 33.7  RDW 15.3 15.1  PLT 267 222   BNPNo results for input(s): BNP, PROBNP in the last 168 hours.  DDimer No results for input(s): DDIMER in the  last 168 hours.   Radiology/Studies:  DG Chest Port 1 View  Result Date: 04/14/2021 CLINICAL DATA:  Atrial fibrillation. Ductal carcinoma in-situ of the left upper outer breast. EXAM: PORTABLE CHEST 1 VIEW COMPARISON:  Chest radiograph 11/27/2011 FINDINGS: Stable mild enlargement of the cardiopericardial silhouette, without edema. Atherosclerotic  calcification of the aortic arch. Reverse lordotic projection. Suspected airway thickening in the lower lobes, which could reflect bronchitis or reactive airways disease. No blunting of the costophrenic angles. Prior right mastectomy. IMPRESSION: 1. Chronic mild enlargement of the cardiopericardial silhouette, without edema. 2. Suspected mild airway thickening in the lower lobes, potentially from bronchitis or reactive airways. 3.  Aortic Atherosclerosis (ICD10-I70.0). Electronically Signed   By: Van Clines M.D.   On: 04/14/2021 12:20     Assessment and Plan:   1. New onset atrial fibrillation: patient sent to the ED from outpatient preop blood work after HR found to be elevated to 140s. Patient was largely asymptomatic. EKG confirmed new onset atrial fibrillation with RVR with rates in the 130s on arrival. TSH wnl. She was started on a heparin gtt for stroke ppx. She was started on a diltiazem gtt with significant improvement in HR and conversion to sinus rhythm.  - CHA2DS2-VASc Score = 7 [CHF History: No, HTN History: Yes, Diabetes History: Yes, Stroke History: Yes, Vascular Disease History: No, Age Score: 2, Gender Score: 1].  Therefore, the patient's annual risk of stroke is 11.2 %.    - Favor transition to eliquis 5mg  BID (does not meet criteria for reduced dosing despite age) - Would avoid use of meloxicam going forward to reduce bleeding risk.  - Will transition to po diltiazem for rate control - will start diltiazem 180mg  daily and monitor for response - Outpatient follow-up has been arranged prior to surgery to ensure adequate rate control  2. HTN: BP stable this admission. Home amlodipine, HCTZ, and lisinopril on hold this admission.  - Will continue to hold amlodipine in lieu for diltiazem for rate control. Transition to po dilt as above - Suspect she can restart home lisinopril and HCTZ as BP is up a bit today.   3. DM type 2: A1C 6.8 this admission; at goal of <7 - Continue  management per primary team   4. History of CVA: occurred in 2012. On aspirin and statin at home.  - Would stop aspirin given need for anticoagulation going forward - Continue statin  5. Breast cancer: s/p right mastectomy in 1999, now with recurrent left breast cancer s/p lumpectomy in 2020 and plans for mastectomy 05/10/21. Following outpatient with oncology - Given reassuring echocardiogram above, do not anticipate further cardiac work-up prior to undergoing planned mastectomy next month.  - Continue close monitoring per oncology outpatient.   Outpatient follow-up has been arranged and AVS updated.   Risk Assessment/Risk Scores:   CHA2DS2-VASc Score = 7  This indicates a 11.2% annual risk of stroke. The patient's score is based upon: CHF History: No HTN History: Yes Diabetes History: Yes Stroke History: Yes Vascular Disease History: No Age Score: 2 Gender Score: 1       For questions or updates, please contact [place] Please consult [email or web address] for contact info under    Signed, Abigail Butts, PA-C  04/15/2021 11:38 AM   I have seen and examined the patient along with Abigail Butts, PA-C .  I have reviewed the chart, notes and new data.  I agree with PA/NP's note.  Key new complaints: minimally aware of the  arrhythmia (maybe mild nausea, definitely no palpitations) Key examination changes: normal CV exam Key new findings / data: on my review of echo, wall thickness measurement was exaggerated (septum and posterior wall are both around 1.3 cm, mild LVH). On ECG limb lead voltage is borderline low, however. No signs of RV wall hypertrophy. No findings to suggest ischemia on either study.  PLAN: Start oral anticoagulant. OK to interrupt the anticoagulant for 2 days before breast surgery. She does not require "bridging". Low to moderate risk of complications with the upcoming breast surgery. There is clearly a risk of atrial fibrillation with RVR, but she  tolerates the arrhythmia very well. I do not see a reason to delay her planned breast surgery for additional workup. Switch from amlodipine to diltiazem for rate control benefit.  Other antiarrhythmics are not indicated in this minimally symptomatic patient.. The possibility of cardiac amyloidosis is a valid one, although it is statistically much more likely that she has hypertensive heart disease as cause of LVH and AFib. It is not urgent to perform that evaluation. Outpatient f/u.  Sanda Klein, MD, [place] [phone] 04/15/2021, 1:18 PM

## 2021-04-17 ENCOUNTER — Telehealth: Payer: Self-pay | Admitting: Cardiovascular Disease

## 2021-04-17 ENCOUNTER — Other Ambulatory Visit: Payer: Self-pay | Admitting: Oncology

## 2021-04-17 LAB — GLUCOSE, CAPILLARY
Glucose-Capillary: 119 mg/dL — ABNORMAL HIGH (ref 70–99)
Glucose-Capillary: 231 mg/dL — ABNORMAL HIGH (ref 70–99)

## 2021-04-17 NOTE — Telephone Encounter (Addendum)
    Amanda Green DOB:  Apr 30, 1935  MRN:  [id number]   Primary Cardiologist: Sanda Klein, MD  Chart reviewed as part of pre-operative protocol coverage. Given past medical history and time since last visit, based on ACC/AHA guidelines, Amanda Green would be at acceptable risk for the planned procedure without further cardiovascular testing.   Patient was recently diagnosed with atrial fibrillation and was seen by Dr. Sallyanne Kuster in the hospital on 04/15/2021, per Dr. Sallyanne Kuster, "OK to interrupt the anticoagulant for 2 days before breast surgery. She does not require "bridging". Low to moderate risk of complications with the upcoming breast surgery. There is clearly a risk of atrial fibrillation with RVR, but she tolerates the arrhythmia very well. I do not see a reason to delay her planned breast surgery for additional workup."  The patient was advised that if she develops new symptoms prior to surgery to contact our office to arrange for a follow-up visit, and she verbalized understanding.  I will route this recommendation to the requesting party via Epic fax function and remove from pre-op pool.  Please call with questions.  [place], [place] 04/17/2021, 3:38 PM

## 2021-04-17 NOTE — Telephone Encounter (Signed)
   [facility] Pre-operative Risk Assessment    Request for surgical clearance:  1. What type of surgery is being performed?  Total Let Mastectomy   2. When is this surgery scheduled?  05/10/21   3. What type of clearance is required (medical clearance vs. Pharmacy clearance to hold med vs. Both)?  Both   4. Are there any medications that need to be held prior to surgery and how long?  Eliquis, 2-3 days prior   5. Practice name and name of physician performing surgery?  [facility]  Dr. Johnathan Hausen  6. What is your office phone number? [phone] 7. What is your office fax number? [phone] 8.   Anesthesia type (None, local, MAC, general) ?  General   Amanda Green 04/17/2021, 12:58 PM  _________________________________________________________________

## 2021-04-18 ENCOUNTER — Telehealth: Payer: Self-pay

## 2021-04-18 NOTE — Telephone Encounter (Signed)
Patient contacted regarding discharge from [facility] on 04/15/21.  Patient understands to follow up with Bebe Liter PA on Thursday 04/27/21 at 2:15 pm.. Patient understands discharge instructions. Patient understands medications and regiment. Patient understands to bring all medications to this visit.

## 2021-04-25 DIAGNOSIS — I4891 Unspecified atrial fibrillation: Secondary | ICD-10-CM | POA: Diagnosis not present

## 2021-04-25 DIAGNOSIS — M792 Neuralgia and neuritis, unspecified: Secondary | ICD-10-CM | POA: Diagnosis not present

## 2021-04-27 ENCOUNTER — Ambulatory Visit (INDEPENDENT_AMBULATORY_CARE_PROVIDER_SITE_OTHER): Payer: Medicare Other | Admitting: Medical

## 2021-04-27 ENCOUNTER — Other Ambulatory Visit: Payer: Self-pay

## 2021-04-27 ENCOUNTER — Encounter: Payer: Self-pay | Admitting: Medical

## 2021-04-27 VITALS — BP 138/76 | HR 97 | Ht 61.0 in | Wt 143.2 lb

## 2021-04-27 DIAGNOSIS — I480 Paroxysmal atrial fibrillation: Secondary | ICD-10-CM

## 2021-04-27 DIAGNOSIS — I639 Cerebral infarction, unspecified: Secondary | ICD-10-CM | POA: Diagnosis not present

## 2021-04-27 DIAGNOSIS — I10 Essential (primary) hypertension: Secondary | ICD-10-CM | POA: Diagnosis not present

## 2021-04-27 DIAGNOSIS — E119 Type 2 diabetes mellitus without complications: Secondary | ICD-10-CM

## 2021-04-27 DIAGNOSIS — D0512 Intraductal carcinoma in situ of left breast: Secondary | ICD-10-CM

## 2021-04-27 MED ORDER — DILTIAZEM HCL ER COATED BEADS 240 MG PO CP24: 240.0000 mg | ORAL_CAPSULE | Freq: Every day | ORAL | 3 refills | Status: DC

## 2021-04-27 NOTE — Patient Instructions (Signed)
Medication Instructions:   INCREASE Diltiazem to 240 mg daily  HOLD Eliquis for 2 days prior to procedure. DO NOT take on 05/08/21 and 05/09/21 and the morning of 05/10/21. Restart at the discretion of the surgeon. *If you need a refill on your cardiac medications before your next appointment, please call your pharmacy*  Lab Work: NONE ordered at this time of appointment   If you have labs (blood work) drawn today and your tests are completely normal, you will receive your results only by: Marland Kitchen MyChart Message (if you have MyChart) OR . A paper copy in the mail If you have any lab test that is abnormal or we need to change your treatment, we will call you to review the results.  Testing/Procedures: NONE ordered at this time of appointment   Follow-Up: At [facility], you and your health needs are our priority.  As part of our continuing mission to provide you with exceptional heart care, we have created designated Provider Care Teams.  These Care Teams include your primary Cardiologist (physician) and Advanced Practice Providers (APPs -  Physician Assistants and Nurse Practitioners) who all work together to provide you with the care you need, when you need it.  We recommend signing up for the patient portal called "MyChart".  Sign up information is provided on this After Visit Summary.  MyChart is used to connect with patients for Virtual Visits (Telemedicine).  Patients are able to view lab/test results, encounter notes, upcoming appointments, etc.  Non-urgent messages can be sent to your provider as well.   To learn more about what you can do with MyChart, go to [email or web address].    Your next appointment:   4 month(s)  The format for your next appointment:   In Person  Provider:   Sanda Klein, MD  Other Instructions

## 2021-04-27 NOTE — Progress Notes (Signed)
Cardiology Office Note   Date:  05/03/2021   ID:  [name], DOB 01-Aug-1935, MRN [id number]  PCP:  Alroy Dust, Carlean Jews.[name], MD  Cardiologist:  Sanda Klein, MD EP: None  Chief Complaint  Patient presents with  . Hospitalization Follow-up    Paroxysmal atrial fibrillation      History of Present Illness: Shanita Kanan is a 86 y.o. female with a PMH of recently diagnosed paroxysmal atrial fibrillation, HTN, HLD, DM type 2, CVA, memory loss, and breast cancer s/p right mastectomy in 1999 with more recent recurrent disease in 2020 s/p lumpectomy with anticipated left mastectomy 05/10/21 who presents for post-hospital follow-up.  She was admitted to the hospital from 04/14/21-04/15/21 after presenting with elevated heart rates and found to be in atrial fibrillation with RVR. She was asymptomatic to her atrial fibrillation. She was started on a diltiazem gtt with conversion to sinus rhythm. Echocardiogram showed EF >75% with severe LVH, G1DD, elevated LVEDP, mild LAE, and no significant valvular abnormalities. There was some concern for possible amyloidosis features on her echocardiogram. She was started on eliquis 5mg  BID for CHA2DS2-VASc Score = 7 (HTN, DM, CVA, Age >33, and female). She was transitioned to po diltiazem 180mg  daily for rate control.   She presents today with her daughter. She has been doing well since discharge. She has no complaints of chest pain, SOB, DOE, palpitations, racing heart beats, dizziness, lightheadedness, syncope, or LE edema. She has been tolerating her eliquis without hematuria, hematochezia, or melena.     Past Medical History:  Diagnosis Date  . Arthritis    knees,  . Breast cancer ([place]) 1999   right mastectomy  . Breast cancer, left ([place]) 05/2019   left lumpectomy  . Diabetes mellitus   . Dislocation of left shoulder joint   . Family history of prostate cancer   . Hyperlipidemia   . Hypertension   . Memory loss   . Numbness and  tingling of right lower extremity   . Paresthesia   . Stroke [facility])    light stroke - 2012 ,right leg nerve damage     Past Surgical History:  Procedure Laterality Date  . ABDOMINAL HYSTERECTOMY    . APPENDECTOMY    . BREAST BIOPSY Left 01/19/2010  . BREAST LUMPECTOMY WITH RADIOACTIVE SEED LOCALIZATION Left 07/17/2019   Procedure: LEFT BREAST LUMPECTOMY WITH RADIOACTIVE SEED LOCALIZATION;  Surgeon: Johnathan Hausen, MD;  Location: [place];  Service: General;  Laterality: Left;  . BREAST SURGERY     Right mastectomy 1999  . CARPAL TUNNEL RELEASE Right 09/30/2014   Procedure: RIGHT CARPAL TUNNEL RELEASE;  Surgeon: Daryll Brod, MD;  Location: [place];  Service: Orthopedics;  Laterality: Right;  . COLONOSCOPY    . EYE SURGERY     cataracts  . JOINT REPLACEMENT     lt total knee 2011  . LIPOMA EXCISION  11/28/2011   Procedure: EXCISION LIPOMA;  Surgeon: Pedro Earls, MD;  Location: [place];  Service: General;  Laterality: N/A;  excisino lipoma 4 cm back of neck  . MASTECTOMY Right 1999     Current Outpatient Medications  Medication Sig Dispense Refill  . apixaban (ELIQUIS) 5 MG TABS tablet Take 1 tablet (5 mg total) by mouth 2 (two) times daily. 60 tablet 1  . atorvastatin (LIPITOR) 80 MG tablet Take 80 mg by mouth daily.    . brimonidine-timolol (COMBIGAN) 0.2-0.5 % ophthalmic solution Place 1 drop into both eyes  every 12 (twelve) hours.    . calcium carbonate (OSCAL) 1500 (600 Ca) MG TABS tablet Take 1,500 mg by mouth 2 (two) times daily with a meal.    . dorzolamide (TRUSOPT) 2 % ophthalmic solution Place 1 drop into both eyes daily.    . hydrochlorothiazide (MICROZIDE) 12.5 MG capsule Take 12.5 mg by mouth daily.    Marland Kitchen latanoprost (XALATAN) 0.005 % ophthalmic solution Place 1 drop into both eyes at bedtime.    Marland Kitchen lisinopril (ZESTRIL) 20 MG tablet Take 20 mg by mouth daily.    . meloxicam (MOBIC) 15 MG tablet Take 1 tablet (15 mg  total) by mouth daily as needed for pain (To be taken only if needed.).    Marland Kitchen metFORMIN (GLUCOPHAGE) 500 MG tablet Take 1,000 mg by mouth daily.    . pregabalin (LYRICA) 50 MG capsule Take 1 capsule (50 mg total) by mouth 3 (three) times daily. 90 capsule 5  . vitamin B-12 (CYANOCOBALAMIN) 1000 MCG tablet Take 1,000 mcg by mouth daily.    . vitamin C (ASCORBIC ACID) 500 MG tablet Take 500 mg by mouth daily.    Marland Kitchen diltiazem (CARDIZEM CD) 240 MG 24 hr capsule Take 1 capsule (240 mg total) by mouth daily. 90 capsule 3   No current facility-administered medications for this visit.    Allergies:   Gabapentin    Social History:  The patient  reports that she has never smoked. She has never used smokeless tobacco. She reports that she does not drink alcohol and does not use drugs.   Family History:  The patient's family history includes Cancer in her son and son; Esophageal cancer in her mother; Other in her father; Prostate cancer in her brother.    ROS:  Please see the history of present illness.   Otherwise, review of systems are positive for none.   All other systems are reviewed and negative.    PHYSICAL EXAM: VS:  BP 138/76   Pulse 97   Ht 5\' 1"  (1.549 m)   Wt 143 lb 3.2 oz (65 kg)   SpO2 97%   BMI 27.06 kg/m  , BMI Body mass index is 27.06 kg/m. GEN: Well nourished, well developed, in no acute distress HEENT: sclera anicteric Neck: no JVD, carotid bruits, or masses Cardiac: RRR; no murmurs, rubs, or gallops, no edema  Respiratory:  clear to auscultation bilaterally, normal work of breathing GI: soft, nontender, nondistended, + BS MS: no deformity or atrophy Skin: warm and dry, no rash Neuro:  Strength and sensation are intact Psych: euthymic mood, full affect   EKG:  EKG is ordered today. The ekg ordered today demonstrates sinus rhythm with sinus arrhythmia and PACs, no STE/D, no TWI.    Recent Labs: 04/14/2021: TSH 2.740 04/15/2021: ALT 11; Magnesium 1.9 04/29/2021: BUN  26; Creatinine, Ser 1.13; Hemoglobin 12.2; Platelets 256; Potassium 3.9; Sodium 136    Lipid Panel No results found for: CHOL, TRIG, HDL, CHOLHDL, VLDL, LDLCALC, LDLDIRECT    Wt Readings from Last 3 Encounters:  05/03/21 141 lb (64 kg)  04/29/21 140 lb (63.5 kg)  04/27/21 143 lb 3.2 oz (65 kg)      Other studies Reviewed: Additional studies/ records that were reviewed today include:   Echocardiogram 04/15/21: 1. Consider infiltrative process such as amyloidosis due to severe LV  wall thickening. Left ventricular ejection fraction, by estimation, is  >75%. The left ventricle has hyperdynamic function. The left ventricle has  no regional wall motion  abnormalities.  There is severe left ventricular hypertrophy. Left  ventricular diastolic parameters are consistent with Grade I diastolic  dysfunction (impaired relaxation). Elevated left ventricular end-diastolic  pressure.  2. Right ventricular systolic function is normal. The right ventricular  size is normal. There is normal pulmonary artery systolic pressure. The  estimated right ventricular systolic pressure is 38.1 mmHg.  3. Left atrial size was mildly dilated.  4. The pericardial effusion is posterior to the left ventricle. Trivial  pericardial effusion.  5. The mitral valve is abnormal. Trivial mitral valve regurgitation.  6. The aortic valve is tricuspid. Aortic valve regurgitation is not  visualized. Mild aortic valve sclerosis is present, with no evidence of  aortic valve stenosis.  7. The inferior vena cava is normal in size with greater than 50%  respiratory variability, suggesting right atrial pressure of 3 mmHg.   Comparison(s): Unable to locate echo for comparison    ASSESSMENT AND PLAN:  1. Paroxysmal atrial fibrillation: patient was asymptomatic at the time of her diagnosis making it difficult to say if there has been any recurrence since discharge. EKG with sinus rhythm today. Tolerating eliquis without  complications. HR is in the 90s today and BP mildly elevated - CHA2DS2-VASc Score = 7 [CHF History: No, HTN History: Yes, Diabetes History: Yes, Stroke History: Yes, Vascular Disease History: No, Age Score: 2, Gender Score: 1].  Therefore, the patient's annual risk of stroke is 11.2 %.    - Continue eliquis 5mg  BID (does not meet criteria for reduced dosing despite age) - Will increase diltiazem to 240mg  daily  2. Possible cardiac amyloidosis: Noted on echo 03/2021. - Could consider work-up after recovery of mastectomy.   3. HTN: BP 138/76 today. - Will increase diltiazem as above - Continue HCTZ and lisinopril  3. DM type 2: A1C 6.8 this admission; at goal of <7 - Continue management per primary team   4. History of CVA: occurred in 2012. No longer on aspirin given need for anticoagulation  - Continue statin  5. Breast cancer: s/p right mastectomy in 1999, now with recurrent left breast cancer s/p lumpectomy in 2020 and plans for mastectomy 05/10/21. Following outpatient with oncology - Given reassuring echocardiogram above, no further cardiac work-up is necessary prior to undergoing planned mastectomy 05/10/21. Will route to Dr. Hassell Done as [place] to hold apixaban 2 days prior to her upcoming mastectomy with plans to restart as soon as she is cleared to do so by her surgeon. - Continue close monitoring per oncology outpatient.    Current medicines are reviewed at length with the patient today.  The patient does not have concerns regarding medicines.  The following changes have been made:  As above  Labs/ tests ordered today include:   Orders Placed This Encounter  Procedures  . EKG 12-Lead     Disposition:   FU with Dr. Sallyanne Kuster in 4 months  Signed, Abigail Butts, PA-C  05/03/2021 3:01 PM

## 2021-04-29 ENCOUNTER — Emergency Department (HOSPITAL_COMMUNITY)
Admission: EM | Admit: 2021-04-29 | Discharge: 2021-04-29 | Disposition: A | Discharge status: Home / Self Care | Payer: Medicare PPO | Attending: Emergency Medicine | Admitting: Emergency Medicine

## 2021-04-29 ENCOUNTER — Other Ambulatory Visit: Payer: Self-pay

## 2021-04-29 ENCOUNTER — Emergency Department (HOSPITAL_COMMUNITY): Payer: Medicare PPO

## 2021-04-29 DIAGNOSIS — Z79899 Other long term (current) drug therapy: Secondary | ICD-10-CM | POA: Diagnosis not present

## 2021-04-29 DIAGNOSIS — W19XXXA Unspecified fall, initial encounter: Secondary | ICD-10-CM

## 2021-04-29 DIAGNOSIS — M2578 Osteophyte, vertebrae: Secondary | ICD-10-CM | POA: Diagnosis not present

## 2021-04-29 DIAGNOSIS — M25562 Pain in left knee: Secondary | ICD-10-CM | POA: Diagnosis not present

## 2021-04-29 DIAGNOSIS — I10 Essential (primary) hypertension: Secondary | ICD-10-CM | POA: Diagnosis not present

## 2021-04-29 DIAGNOSIS — Z7984 Long term (current) use of oral hypoglycemic drugs: Secondary | ICD-10-CM | POA: Diagnosis not present

## 2021-04-29 DIAGNOSIS — R42 Dizziness and giddiness: Secondary | ICD-10-CM | POA: Insufficient documentation

## 2021-04-29 DIAGNOSIS — Z853 Personal history of malignant neoplasm of breast: Secondary | ICD-10-CM | POA: Diagnosis not present

## 2021-04-29 DIAGNOSIS — M79605 Pain in left leg: Secondary | ICD-10-CM

## 2021-04-29 DIAGNOSIS — S8992XA Unspecified injury of left lower leg, initial encounter: Secondary | ICD-10-CM | POA: Diagnosis not present

## 2021-04-29 DIAGNOSIS — Z043 Encounter for examination and observation following other accident: Secondary | ICD-10-CM | POA: Diagnosis not present

## 2021-04-29 DIAGNOSIS — E119 Type 2 diabetes mellitus without complications: Secondary | ICD-10-CM | POA: Insufficient documentation

## 2021-04-29 DIAGNOSIS — W1830XA Fall on same level, unspecified, initial encounter: Secondary | ICD-10-CM | POA: Diagnosis not present

## 2021-04-29 LAB — BASIC METABOLIC PANEL
Anion gap: 6 (ref 5–15)
BUN: 26 mg/dL — ABNORMAL HIGH (ref 8–23)
CO2: 22 mmol/L (ref 22–32)
Calcium: 8.6 mg/dL — ABNORMAL LOW (ref 8.9–10.3)
Chloride: 108 mmol/L (ref 98–111)
Creatinine, Ser: 1.13 mg/dL — ABNORMAL HIGH (ref 0.44–1.00)
GFR, Estimated: 48 mL/min — ABNORMAL LOW (ref >60)
Glucose, Bld: 136 mg/dL — ABNORMAL HIGH (ref 70–99)
Potassium: 3.9 mmol/L (ref 3.5–5.1)
Sodium: 136 mmol/L (ref 135–145)

## 2021-04-29 LAB — URINALYSIS, ROUTINE W REFLEX MICROSCOPIC
Bilirubin Urine: NEGATIVE
Glucose, UA: NEGATIVE mg/dL
Hgb urine dipstick: NEGATIVE
Ketones, ur: NEGATIVE mg/dL
Nitrite: NEGATIVE
Protein, ur: NEGATIVE mg/dL
Specific Gravity, Urine: 1.006 (ref 1.005–1.030)
pH: 5 (ref 5.0–8.0)

## 2021-04-29 LAB — CBC
HCT: 37 % (ref 36.0–46.0)
Hemoglobin: 12.2 g/dL (ref 12.0–15.0)
MCH: 29.6 pg (ref 26.0–34.0)
MCHC: 33 g/dL (ref 30.0–36.0)
MCV: 89.8 fL (ref 80.0–100.0)
Platelets: 256 10*3/uL (ref 150–400)
RBC: 4.12 MIL/uL (ref 3.87–5.11)
RDW: 14.5 % (ref 11.5–15.5)
WBC: 8 10*3/uL (ref 4.0–10.5)
nRBC: 0 % (ref 0.0–0.2)

## 2021-04-29 LAB — CBG MONITORING, ED: Glucose-Capillary: 257 mg/dL — ABNORMAL HIGH (ref 70–99)

## 2021-04-29 MED ORDER — DILTIAZEM HCL ER COATED BEADS 240 MG PO CP24
240.0000 mg | ORAL_CAPSULE | Freq: Once | ORAL | Status: AC
  Administered 2021-04-29: 240 mg via ORAL
  Filled 2021-04-29: qty 1

## 2021-04-29 MED ORDER — SODIUM CHLORIDE 0.9 % IV BOLUS
500.0000 mL | Freq: Once | INTRAVENOUS | Status: AC
  Administered 2021-04-29: 500 mL via INTRAVENOUS

## 2021-04-29 NOTE — ED Notes (Signed)
Level 2 activated after arrival due to family report that patient was recently put on Eliquis after April hospitalization due to Afib RVR.

## 2021-04-29 NOTE — ED Notes (Signed)
Pt transported to xray 

## 2021-04-29 NOTE — Progress Notes (Signed)
Orthopedic Tech Progress Note Patient Details:  Kristyl Athens Aug 24, 1935 [id number] Level 2 Trauma. Patient ID: [name], female   DOB: 1935/05/17, 86 y.o.   MRN: [id number]   Chip Boer 04/29/2021, 12:45 PM

## 2021-04-29 NOTE — ED Triage Notes (Signed)
Pt reports being dizzy and weak when she woke up then falling when trying to walk around 0800 this morning. Landed on buttocks on the floor, no LOC, and did not hit head. Sat on the floor until someone was able to come help her. Denies pain at present. Pt brought by family member who wanted her to be evaluated since the fall was unwitnessed.

## 2021-04-29 NOTE — Discharge Instructions (Signed)
Suspect that you might have a left inferior pubic ramus fracture as discussed although not particularly tender in this area.  Use walker for activity as tolerated.  Recommend Tylenol and ice for pain.  Follow-up with your primary care doctor.  Follow-up with orthopedics as well if you continue to have persisting pain in your hips while walking.

## 2021-04-29 NOTE — ED Notes (Signed)
ED Provider at bedside. 

## 2021-04-29 NOTE — ED Provider Notes (Addendum)
[place] EMERGENCY DEPARTMENT Provider Note   CSN: [id number] Arrival date & time: 04/29/21  1127     History Chief Complaint  Patient presents with  . Fall    Amanda Green is a 86 y.o. female.  Patient had unwitnessed fall at home.  On blood thinners.  She states that she woke up to let the dog out when she tripped as her foot got caught on something.  She did not think that she hit her head.  She had a fall earlier in the week and having some pain in her left knee from that.  She does not use a walker at baseline.  The history is provided by the patient.  Fall This is a new problem. The current episode started less than 1 hour ago. The problem has been resolved. Pertinent negatives include no chest pain, no abdominal pain, no headaches and no shortness of breath. Nothing aggravates the symptoms. Nothing relieves the symptoms. She has tried nothing for the symptoms. The treatment provided no relief.       Past Medical History:  Diagnosis Date  . Arthritis   . Breast cancer ([place]) 1999   right mastectomy  . Breast cancer, left ([place]) 05/2019   left lumpectomy  . Diabetes mellitus   . Dislocation of left shoulder joint   . Family history of prostate cancer   . Hyperlipidemia   . Hypertension   . Memory loss   . Numbness and tingling of right lower extremity   . Paresthesia   . Stroke [facility])    light stroke - 2012 ,right leg nerve damage     Patient Active Problem List   Diagnosis Date Noted  . New onset atrial fibrillation ([place]) 04/14/2021  . Breast cancer ([place]) 04/14/2021  . Essential hypertension 04/14/2021  . Diabetes mellitus type 2, controlled ([place]) 04/14/2021  . Cerebrovascular accident (CVA) ([place]) 02/07/2021  . Cognitive impairment 02/07/2021  . Genetic testing 09/25/2019  . Family history of prostate cancer   . Ductal carcinoma in situ (DCIS) of left breast 06/10/2019  . Pain 08/13/2018  . Trigger ring finger of left hand  08/13/2018  . Paresthesia of right lower extremity 01/28/2017  . Stroke ([place]) 01/28/2017  . Colon polyps 08/15/2016  . Chronic right shoulder pain 11/24/2015  . Right mastectomy 1999 03/26/2012    Past Surgical History:  Procedure Laterality Date  . ABDOMINAL HYSTERECTOMY    . APPENDECTOMY    . BREAST BIOPSY Left 01/19/2010  . BREAST LUMPECTOMY WITH RADIOACTIVE SEED LOCALIZATION Left 07/17/2019   Procedure: LEFT BREAST LUMPECTOMY WITH RADIOACTIVE SEED LOCALIZATION;  Surgeon: Johnathan Hausen, MD;  Location: [place];  Service: General;  Laterality: Left;  . BREAST SURGERY     Right mastectomy 1999  . CARPAL TUNNEL RELEASE Right 09/30/2014   Procedure: RIGHT CARPAL TUNNEL RELEASE;  Surgeon: Daryll Brod, MD;  Location: [place];  Service: Orthopedics;  Laterality: Right;  . COLONOSCOPY    . EYE SURGERY     cataracts  . JOINT REPLACEMENT     lt total knee 2011  . LIPOMA EXCISION  11/28/2011   Procedure: EXCISION LIPOMA;  Surgeon: Pedro Earls, MD;  Location: [place];  Service: General;  Laterality: N/A;  excisino lipoma 4 cm back of neck  . MASTECTOMY Right 1999     OB History   No obstetric history on file.     Family History  Problem Relation Age of  Onset  . Esophageal cancer Mother        smoker  . Other Father        Unsure of medical history.  . Prostate cancer Brother        d. 68s  . Cancer Son        ?? unsure, but may have had cancer  . Cancer Son        ?? unsure, but may have had cancer    Social History   Tobacco Use  . Smoking status: Never Smoker  . Smokeless tobacco: Never Used  Substance Use Topics  . Alcohol use: No  . Drug use: No    Home Medications Prior to Admission medications   Medication Sig Start Date End Date Taking? Authorizing Provider  apixaban (ELIQUIS) 5 MG TABS tablet Take 1 tablet (5 mg total) by mouth 2 (two) times daily. 04/15/21  Yes Hongalgi, Lenis Dickinson, MD  atorvastatin  (LIPITOR) 80 MG tablet Take 80 mg by mouth daily.   Yes [provider]  brimonidine-timolol (COMBIGAN) 0.2-0.5 % ophthalmic solution Place 1 drop into both eyes every 12 (twelve) hours.   Yes [provider]  calcium carbonate (OSCAL) 1500 (600 Ca) MG TABS tablet Take 1,500 mg by mouth 2 (two) times daily with a meal.   Yes [provider]  diltiazem (CARDIZEM CD) 240 MG 24 hr capsule Take 1 capsule (240 mg total) by mouth daily. 04/27/21  Yes Kroeger, Daleen Snook M., PA-C  dorzolamide (TRUSOPT) 2 % ophthalmic solution Place 1 drop into both eyes daily. 10/19/20  Yes [provider]  hydrochlorothiazide (MICROZIDE) 12.5 MG capsule Take 12.5 mg by mouth daily.   Yes [provider]  latanoprost (XALATAN) 0.005 % ophthalmic solution Place 1 drop into both eyes at bedtime. 11/13/15  Yes [provider]  lisinopril (ZESTRIL) 20 MG tablet Take 20 mg by mouth daily.   Yes [provider]  meloxicam (MOBIC) 15 MG tablet Take 1 tablet (15 mg total) by mouth daily as needed for pain (To be taken only if needed.). 04/15/21  Yes Hongalgi, Lenis Dickinson, MD  metFORMIN (GLUCOPHAGE) 500 MG tablet Take 1,000 mg by mouth daily. 08/18/11  Yes [provider]  pregabalin (LYRICA) 50 MG capsule Take 1 capsule (50 mg total) by mouth 3 (three) times daily. 02/07/21  Yes Sater, Nanine Means, MD  vitamin B-12 (CYANOCOBALAMIN) 1000 MCG tablet Take 1,000 mcg by mouth daily.   Yes [provider]  vitamin C (ASCORBIC ACID) 500 MG tablet Take 500 mg by mouth daily.   Yes [provider]    Allergies    Gabapentin  Review of Systems   Review of Systems  Constitutional: Negative for chills and fever.  HENT: Negative for ear pain and sore throat.   Eyes: Negative for pain and visual disturbance.  Respiratory: Negative for cough and shortness of breath.   Cardiovascular: Negative for chest pain and palpitations.  Gastrointestinal: Negative for  abdominal pain and vomiting.  Genitourinary: Negative for dysuria and hematuria.  Musculoskeletal: Positive for arthralgias. Negative for back pain.  Skin: Negative for color change and rash.  Neurological: Positive for light-headedness (earlier in week). Negative for dizziness, tremors, seizures, syncope, facial asymmetry, speech difficulty, weakness, numbness and headaches.  All other systems reviewed and are negative.   Physical Exam Updated Vital Signs  ED Triage Vitals  Enc Vitals Group     BP 04/29/21 1136 107/72     Pulse Rate 04/29/21 1136  95     Resp 04/29/21 1136 14     Temp 04/29/21 1136 97.8 F (36.6 C)     Temp Source 04/29/21 1225 Oral     SpO2 04/29/21 1136 98 %     Weight 04/29/21 1134 140 lb (63.5 kg)     Height 04/29/21 1134 5\' 1"  (1.549 m)     Head Circumference --      Peak Flow --      Pain Score 04/29/21 1134 0     Pain Loc --      Pain Edu? --      Excl. in [place]? --     Physical Exam Vitals and nursing note reviewed.  Constitutional:      General: She is not in acute distress.    Appearance: She is well-developed. She is not ill-appearing.  HENT:     Head: Normocephalic and atraumatic.     Mouth/Throat:     Mouth: Mucous membranes are dry.  Eyes:     Extraocular Movements: Extraocular movements intact.     Conjunctiva/sclera: Conjunctivae normal.     Pupils: Pupils are equal, round, and reactive to light.  Cardiovascular:     Rate and Rhythm: Normal rate and regular rhythm.     Pulses: Normal pulses.     Heart sounds: Normal heart sounds. No murmur heard.   Pulmonary:     Effort: Pulmonary effort is normal. No respiratory distress.     Breath sounds: Normal breath sounds.  Abdominal:     General: Abdomen is flat.     Palpations: Abdomen is soft.     Tenderness: There is no abdominal tenderness.  Musculoskeletal:        General: Tenderness (TTP left knee) present. No swelling. Normal range of motion.     Cervical back: Normal range of  motion and neck supple.  Skin:    General: Skin is warm and dry.     Capillary Refill: Capillary refill takes less than 2 seconds.  Neurological:     General: No focal deficit present.     Mental Status: She is alert and oriented to person, place, and time.     Cranial Nerves: No cranial nerve deficit.     Sensory: No sensory deficit.     Motor: No weakness.     Coordination: Coordination normal.     Gait: Gait normal.  Psychiatric:        Mood and Affect: Mood normal.     ED Results / Procedures / Treatments   Labs (all labs ordered are listed, but only abnormal results are displayed) Labs Reviewed  URINALYSIS, ROUTINE W REFLEX MICROSCOPIC - Abnormal; Notable for the following components:      Result Value   APPearance HAZY (*)    Leukocytes,Ua MODERATE (*)    Bacteria, UA RARE (*)    All other components within normal limits  BASIC METABOLIC PANEL - Abnormal; Notable for the following components:   Glucose, Bld 136 (*)    BUN 26 (*)    Creatinine, Ser 1.13 (*)    Calcium 8.6 (*)    GFR, Estimated 48 (*)    All other components within normal limits  CBG MONITORING, ED - Abnormal; Notable for the following components:   Glucose-Capillary 257 (*)    All other components within normal limits  CBC    EKG EKG Interpretation  Date/Time:  Saturday Apr 29 2021 11:40:15 EDT Ventricular Rate:  97 PR Interval:  QRS Duration: 70 QT Interval:  338 QTC Calculation: 429 R Axis:   21 Text Interpretation: Atrial fibrillation with premature ventricular or aberrantly conducted complexes Low voltage QRS Septal infarct , age undetermined Abnormal ECG Confirmed by Lennice Sites [phone]) on 04/29/2021 11:59:03 AM   Radiology DG Chest 2 View  Result Date: 04/29/2021 CLINICAL DATA:  Fall.  Dizziness.  LEFT knee pain. EXAM: CHEST - 2 VIEW COMPARISON:  04/14/2021 FINDINGS: Heart size is normal. The lungs are clear. No pulmonary edema. Prior RIGHT axillary node dissection and RIGHT  mastectomy. IMPRESSION: No active cardiopulmonary disease. Electronically Signed   By: Nolon Nations M.D.   On: 04/29/2021 13:17   DG Pelvis 1-2 Views  Result Date: 04/29/2021 CLINICAL DATA:  Dizziness.  Fall.  Pain. EXAM: PELVIS - 1-2 VIEW COMPARISON:  None. FINDINGS: There is minimal irregularity of the LEFT inferior pubic ramus, raising the question of a minimally displaced fracture. Recommend correlation with physical exam findings. Degenerative changes are noted in the LOWER lumbar spine. No evidence for acute hip fracture. IMPRESSION: Possible LEFT inferior pubic ramus fracture. Electronically Signed   By: Nolon Nations M.D.   On: 04/29/2021 13:21   DG Knee 2 Views Left  Result Date: 04/29/2021 CLINICAL DATA:  Fall.  Dizziness.  Knee pain. EXAM: LEFT KNEE - 1-2 VIEW COMPARISON:  03/20/2011 FINDINGS: Status post knee arthroplasty. The hardware is well seated. No evidence for dislocation or acute fracture. No joint effusion. IMPRESSION: Status post knee arthroplasty. No evidence for acute  abnormality. Electronically Signed   By: Nolon Nations M.D.   On: 04/29/2021 13:18   CT Head Wo Contrast  Result Date: 04/29/2021 CLINICAL DATA:  Fall.  Fall on blood thinners. EXAM: CT HEAD WITHOUT CONTRAST TECHNIQUE: Contiguous axial images were obtained from the base of the skull through the vertex without intravenous contrast. COMPARISON:  Brain MRI 02/20/2021. Head CT 08/01/2009. FINDINGS: Brain: Mild cerebral and cerebellar atrophy. 7 mm nodular hyperdense focus along the margin of the posterior body of the right lateral ventricle. This corresponds with nodular heterotopic gray matter better appreciated on the prior brain MRI of 04/22/2021. Patchy ill-defined hypoattenuation within the cerebral white matter is nonspecific, but compatible with chronic small vessel ischemic disease. There is no acute intracranial hemorrhage. No demarcated cortical infarct. No extra-axial fluid collection. No evidence  of intracranial mass. No midline shift. Vascular: No hyperdense vessel.  Atherosclerotic calcifications Skull: Normal. Negative for fracture or focal lesion. Sinuses/Orbits: Visualized orbits show no acute finding. Trace scattered paranasal sinus mucosal thickening at the imaged levels. IMPRESSION: No evidence of acute intracranial abnormality Redemonstrated 7 mm focus of nodular gray matter heterotopia along the margin of the right lateral ventricle. Query a history of seizures. Mild generalized parenchymal atrophy with cerebral white matter chronic small vessel ischemic disease. Electronically Signed   By: Kellie Simmering DO   On: 04/29/2021 13:45   CT Cervical Spine Wo Contrast  Result Date: 04/29/2021 CLINICAL DATA:  Fall, blood thinners EXAM: CT CERVICAL SPINE WITHOUT CONTRAST TECHNIQUE: Multidetector CT imaging of the cervical spine was performed without intravenous contrast. Multiplanar CT image reconstructions were also generated. COMPARISON:  None. FINDINGS: Alignment: Degenerative straightening and reversal of the normal cervical lordosis. Skull base and vertebrae: No acute fracture. No primary bone lesion or focal pathologic process. Soft tissues and spinal canal: No prevertebral fluid or swelling. No visible canal hematoma. Disc levels: Moderate to severe multilevel disc space height loss and osteophytosis from C3 through C7. Upper chest: Negative. Other:  None. IMPRESSION: 1. No fracture or static subluxation of the cervical spine. 2. Moderate to severe multilevel disc space height loss and osteophytosis from C3 through C7. Electronically Signed   By: Eddie Candle M.D.   On: 04/29/2021 13:45    Procedures Procedures   Medications Ordered in ED Medications  sodium chloride 0.9 % bolus 500 mL (0 mLs Intravenous Stopped 04/29/21 1351)  diltiazem (CARDIZEM CD) 24 hr capsule 240 mg (240 mg Oral Given 04/29/21 1402)    ED Course  I have reviewed the triage vital signs and the nursing  notes.  Pertinent labs & imaging results that were available during my care of the patient were reviewed by me and considered in my medical decision making (see chart for details).    MDM Rules/Calculators/A&P                          Amanda Green is an 86 year old female with history of breast cancer, diabetes, hypertension, memory loss who presents to the ED after mechanical fall.  Patient states that she tripped after letting her dog outside.  She has had a fall earlier in the week.  Having some left knee pain.  Did not think she hit her head but she is on blood thinners.  Has been having some ambulation issues recently.  She has not yet been able to get a walker but her family member was bringing her 1 today.  Other than knee pain she does not have any specific pain.  Will check basic labs and urinalysis to evaluate for other reasons for her fall.  She does not appear to have any stroke symptoms on exam.  She has good range of motion at her hips.  We will get a head and neck CT given the unwitnessed fall and unknown if she hit her head.  X-rays overall unremarkable except for possible inferior pubic rami fracture.  She is not particularly tender in this area.  However we will have her use a walker and activity as tolerated and will give her information to follow-up with orthopedics if her primary care doctor is unable to follow-up with her about this injury.  Overall I have a low clinical suspicion that she has an actual fracture here but given that she is had multiple recent falls is possible.  Does like she just may benefit from some physical therapy.  Urinalysis pending.  No significant anemia or electrolyte abnormality.  CT scan of head overall unremarkable. BMP unremarkable.  She has no history of seizures.  No seizure-like activity during these events.  Redemonstration of 7 mm focus of nodular gray matter in the right ventricle was seen again.  Overall this is a nonspecific finding.   Overall suspect mechanical fall.  Questionable left inferior pubic rami fracture.  Activity as tolerated with a walker.  Tylenol and ice for pain.  If pain is persisting recommend follow-up with orthopedics.  Otherwise can follow-up with primary care doctor.   This chart was dictated using voice recognition software.  Despite best efforts to proofread,  errors can occur which can change the documentation meaning.   Final Clinical Impression(s) / ED Diagnoses Final diagnoses:  Fall, initial encounter  Pain of left lower extremity    Rx / DC Orders ED Discharge Orders    None       Lennice Sites, DO 04/29/21 [place], Wil Slape, DO 04/29/21 1443    Ronnald Nian, Leilanni Halvorson, DO  04/29/21 1524  

## 2021-04-29 NOTE — ED Notes (Signed)
Pt came in by EMS after a fall at home. Pt daughter came by to visit her this AM when daughter found her on the floor. Pt reports letting the dogs out and tripped on her way back. Landed to her buttocks onto carpet/hardwood floor. Denies hitting head or LOC. Pt is alert and oriented x 4. Reports 2nd fall this week with last fall on Tuesday. Pt was recently dx with afib on April and started on Eliquis. Denies any other pain or discomfort. Continuous cardiac monitoring initiated. Level 2 trauma initiated at 1225.

## 2021-04-29 NOTE — Progress Notes (Signed)
   04/29/21 1226  Clinical Encounter Type  Visited With Patient not available  Visit Type Trauma  Referral From Nurse  Consult/Referral To Chaplain   Chaplain responded to Level 2 trauma. Pt being treated. No current need for chaplain. Chaplain remains available.   This note was prepared by Chaplain Resident, Dante Gang, MDiv. Chaplain remains available as needed through the on-call pager: [phone].

## 2021-04-30 ENCOUNTER — Other Ambulatory Visit: Payer: Self-pay | Admitting: Oncology

## 2021-05-02 NOTE — Patient Instructions (Addendum)
DUE TO COVID-19 ONLY ONE VISITOR IS ALLOWED TO COME WITH YOU AND STAY IN THE WAITING ROOM ONLY DURING PRE OP AND PROCEDURE DAY OF SURGERY. THE 2 VISITORS  MAY VISIT WITH YOU AFTER SURGERY IN YOUR PRIVATE ROOM DURING VISITING HOURS ONLY!  YOU NEED TO HAVE A COVID 19 TEST ON__5/23_____ @_11 :05______, THIS TEST MUST BE DONE BEFORE SURGERY,  COVID TESTING SITE [place] [address], IT IS ON THE RIGHT GOING OUT [address] APPROXIMATELY  2 MINUTES PAST [employer] ON THE RIGHT. ONCE YOUR COVID TEST IS COMPLETED,  PLEASE BEGIN THE QUARANTINE INSTRUCTIONS AS OUTLINED IN YOUR HANDOUT.                [name]   Your procedure is scheduled on: 05/10/21   Report to [facility] Main  Entrance   Report to admitting at   6:30 AM     Call this number if you have problems the morning of surgery [phone]   . BRUSH YOUR TEETH MORNING OF SURGERY AND RINSE YOUR MOUTH OUT, NO CHEWING GUM CANDY OR MINTS.  No food after midnight  .  You may have clear liquid until 5:30 AM.   . Nothing by mouth after 5:30 AM.    Take these medicines the morning of surgery with A SIP OF WATER: Lyrica, Diltiazem  How to Manage Your Diabetes Before and After Surgery  Why is it important to control my blood sugar before and after surgery? . Improving blood sugar levels before and after surgery helps healing and can limit problems. . A way of improving blood sugar control is eating a healthy diet by: o  Eating less sugar and carbohydrates o  Increasing activity/exercise o  Talking with your doctor about reaching your blood sugar goals . High blood sugars (greater than 180 mg/dL) can raise your risk of infections and slow your recovery, so you will need to focus on controlling your diabetes during the weeks before surgery. . Make sure that the doctor who takes care of your diabetes knows about your planned surgery including the date and location.  How do I manage my  blood sugar before surgery? . Check your blood sugar at least 4 times a day, starting 2 days before surgery, to make sure that the level is not too high or low. o Check your blood sugar the morning of your surgery when you wake up and every 2 hours until you get to the Short Stay unit. . If your blood sugar is less than 70 mg/dL, you will need to treat for low blood sugar: o Do not take insulin. o Treat a low blood sugar (less than 70 mg/dL) with  cup of clear juice (cranberry or apple), 4 glucose tablets, OR glucose gel. o Recheck blood sugar in 15 minutes after treatment (to make sure it is greater than 70 mg/dL). If your blood sugar is not greater than 70 mg/dL on recheck, call [phone] for further instructions. . Report your blood sugar to the short stay nurse when you get to Short Stay.  . If you are admitted to the hospital after surgery: o Your blood sugar will be checked by the staff and you will probably be given insulin after surgery (instead of oral diabetes medicines) to make sure you have good blood sugar levels. o The goal for blood sugar control after surgery is 80-180 mg/dL.   WHAT DO I DO ABOUT MY DIABETES MEDICATION?  Marland Kitchen Do  not take oral diabetes medicines (pills) the morning of surgery.                                 You may not have any metal on your body including hair pins and              piercings  Do not wear jewelry, make-up, lotions, powders or perfumes, deodorant             Do not wear nail polish on your fingernails.  Do not shave  48 hours prior to surgery.     Do not bring valuables to the hospital. [place].  Contacts, dentures or bridgework may not be worn into surgery.                [place] - Preparing for Surgery Before surgery, you can play an important role.  Because skin is not sterile, your skin needs to be as free of germs as possible.  You can reduce the number of germs on your  skin by washing with CHG (chlorahexidine gluconate) soap before surgery.  CHG is an antiseptic cleaner which kills germs and bonds with the skin to continue killing germs even after washing. Please DO NOT use if you have an allergy to CHG or antibacterial soaps.  If your skin becomes reddened/irritated stop using the CHG and inform your nurse when you arrive at Short Stay. Do not shave (including legs and underarms) for at least 48 hours prior to the first CHG shower.  You may shave your face/neck. Please follow these instructions carefully:  1.  Shower with CHG Soap the night before surgery and the  morning of Surgery.  2.  If you choose to wash your hair, wash your hair first as usual with your  normal  shampoo.  3.  After you shampoo, rinse your hair and body thoroughly to remove the  shampoo.                                        4.  Use CHG as you would any other liquid soap.  You can apply chg directly  to the skin and wash                       Gently with a scrungie or clean washcloth.  5.  Apply the CHG Soap to your body ONLY FROM THE NECK DOWN.   Do not use on face/ open                           Wound or open sores. Avoid contact with eyes, ears mouth and genitals (private parts).                       Wash face,  Genitals (private parts) with your normal soap.             6.  Wash thoroughly, paying special attention to the area where your surgery  will be performed.  7.  Thoroughly rinse your body with warm water from the neck down.  8.  DO NOT shower/wash with your normal soap after  using and rinsing off  the CHG Soap.             9.  Pat yourself dry with a clean towel.            10.  Wear clean pajamas.            11.  Place clean sheets on your bed the night of your first shower and do not  sleep with pets. Day of Surgery : Do not apply any lotions/deodorants the morning of surgery.  Please wear clean clothes to the hospital/surgery center.  FAILURE TO FOLLOW THESE  INSTRUCTIONS MAY RESULT IN THE CANCELLATION OF YOUR SURGERY PATIENT SIGNATURE_________________________________  NURSE SIGNATURE__________________________________  ________________________________________________________________________   Adam Phenix  An incentive spirometer is a tool that can help keep your lungs clear and active. This tool measures how well you are filling your lungs with each breath. Taking long deep breaths may help reverse or decrease the chance of developing breathing (pulmonary) problems (especially infection) following:  A long period of time when you are unable to move or be active. BEFORE THE PROCEDURE   If the spirometer includes an indicator to show your best effort, your nurse or respiratory therapist will set it to a desired goal.  If possible, sit up straight or lean slightly forward. Try not to slouch.  Hold the incentive spirometer in an upright position. INSTRUCTIONS FOR USE  1. Sit on the edge of your bed if possible, or sit up as far as you can in bed or on a chair. 2. Hold the incentive spirometer in an upright position. 3. Breathe out normally. 4. Place the mouthpiece in your mouth and seal your lips tightly around it. 5. Breathe in slowly and as deeply as possible, raising the piston or the ball toward the top of the column. 6. Hold your breath for 3-5 seconds or for as long as possible. Allow the piston or ball to fall to the bottom of the column. 7. Remove the mouthpiece from your mouth and breathe out normally. 8. Rest for a few seconds and repeat Steps 1 through 7 at least 10 times every 1-2 hours when you are awake. Take your time and take a few normal breaths between deep breaths. 9. The spirometer may include an indicator to show your best effort. Use the indicator as a goal to work toward during each repetition. 10. After each set of 10 deep breaths, practice coughing to be sure your lungs are clear. If you have an incision (the  cut made at the time of surgery), support your incision when coughing by placing a pillow or rolled up towels firmly against it. Once you are able to get out of bed, walk around indoors and cough well. You may stop using the incentive spirometer when instructed by your caregiver.  RISKS AND COMPLICATIONS  Take your time so you do not get dizzy or light-headed.  If you are in pain, you may need to take or ask for pain medication before doing incentive spirometry. It is harder to take a deep breath if you are having pain. AFTER USE  Rest and breathe slowly and easily.  It can be helpful to keep track of a log of your progress. Your caregiver can provide you with a simple table to help with this. If you are using the spirometer at home, follow these instructions: [place] IF:   You are having difficultly using the spirometer.  You have trouble using the  spirometer as often as instructed.  Your pain medication is not giving enough relief while using the spirometer.  You develop fever of 100.5 F (38.1 C) or higher. SEEK IMMEDIATE MEDICAL CARE IF:   You cough up bloody sputum that had not been present before.  You develop fever of 102 F (38.9 C) or greater.  You develop worsening pain at or near the incision site. MAKE SURE YOU:   Understand these instructions.  Will watch your condition.  Will get help right away if you are not doing well or get worse. Document Released: 04/15/2007 Document Revised: 02/25/2012 Document Reviewed: 06/16/2007 [facility] Patient Information 2014 [place], [place].   ________________________________________________________________________

## 2021-05-03 ENCOUNTER — Other Ambulatory Visit: Payer: Self-pay

## 2021-05-03 ENCOUNTER — Encounter: Payer: Self-pay | Admitting: Medical

## 2021-05-03 ENCOUNTER — Encounter (HOSPITAL_COMMUNITY): Payer: Self-pay

## 2021-05-03 ENCOUNTER — Encounter (HOSPITAL_COMMUNITY)
Admission: RE | Admit: 2021-05-03 | Discharge: 2021-05-03 | Disposition: A | Discharge status: Home / Self Care | Payer: Medicare PPO | Source: Ambulatory Visit | Attending: Surgery | Admitting: Surgery

## 2021-05-03 DIAGNOSIS — Z01812 Encounter for preprocedural laboratory examination: Secondary | ICD-10-CM | POA: Diagnosis not present

## 2021-05-03 LAB — GLUCOSE, CAPILLARY: Glucose-Capillary: 176 mg/dL — ABNORMAL HIGH (ref 70–99)

## 2021-05-03 NOTE — Progress Notes (Signed)
TY

## 2021-05-03 NOTE — Addendum Note (Signed)
Addended by: Roby Lofts on: 05/03/2021 03:16 PM   Modules accepted: Level of Service

## 2021-05-03 NOTE — Progress Notes (Signed)
COVID Vaccine Completed:Yes Date COVID Vaccine completed:3/20-Booster 07/2020 COVID vaccine manufacturer: Pfizer     PCP - Dr. Leonia Corona Cardiologist - Dr. Jerilynn Mages. [place]  Chest x-ray - 04/29/21-epic EKG - 05/01/21-epic Stress Test - no ECHO - 04/15/21-epic Cardiac Cath - no Pacemaker/ICD device last checked:NA  Sleep Study - no CPAP -   Fasting Blood Sugar - 110-120 Checks Blood Sugar _____ times a day. 2-3 times a week  Blood Thinner Instructions:Eliquis/ Dr. Sallyanne Kuster Aspirin Instructions:Stop 2 days prior to DOS/ Dr. Hassell Done Last Dose:05/08/21  Anesthesia review:   Patient denies shortness of breath, fever, cough and chest pain at PAT appointment Yes. Pt uses a walker and doesn't climb stairs. She reports no SOB doing a little housework or with ADls  Patient verbalized understanding of instructions that were given to them at the PAT appointment. Patient was also instructed that they will need to review over the PAT instructions again at home before surgery. Yes

## 2021-05-04 NOTE — Progress Notes (Signed)
Anesthesia Chart Review   Case: [id number] Date/Time: 05/10/21 0815   Procedure: LEFT TOTAL MASTECTOMY (Left Breast)   Anesthesia type: General   Pre-op diagnosis: DIFFUSE DUCTAL CARCINOMA IN SITU   Location: [place] / [facility] ORS   Surgeons: Johnathan Hausen, MD      DISCUSSION:86 y.o. never smoker with h/o HTN, DM II, atrial fibrillation (on Eliquis), stroke, breast cancer scheduled for above procedure 05/10/2021 with Dr. Johnathan Hausen.   Pt last seen by cardiology 04/27/2021. Per OV note, "- Given reassuring echocardiogram above, no further cardiac work-up is necessary prior to undergoing planned mastectomy 05/10/21. Will route to Dr. Hassell Done as [place] to hold apixaban 2 days prior to her upcoming mastectomy with plans to restart as soon as she is cleared to do so by her surgeon. - Continue close monitoring per oncology outpatient."  Anticipate pt can proceed with planned procedure barring acute status change.   VS: Ht 5\' 1"  (1.549 m)   Wt 64.8 kg   BMI 26.99 kg/m   PROVIDERS: Mitchell, L.[name], MD is PCP   Sanda Klein, MD is Cardiologist  LABS: Labs reviewed: Acceptable for surgery. (all labs ordered are listed, but only abnormal results are displayed)  Labs Reviewed  BASIC METABOLIC PANEL - Abnormal; Notable for the following components:      Result Value   Glucose, Bld 272 (*)    GFR, Estimated 59 (*)    All other components within normal limits     IMAGES:   EKG: 05/01/2021 Rate 89 bpm  Atrial fibrillation  Ventricular premature complex Borderline low voltage, extremity leads  CV: Echo 04/15/2021 1. Consider infiltrative process such as amyloidosis due to severe LV  wall thickening. Left ventricular ejection fraction, by estimation, is  >75%. The left ventricle has hyperdynamic function. The left ventricle has  no regional wall motion  abnormalities. There is severe left ventricular hypertrophy. Left  ventricular diastolic parameters are consistent with  Grade I diastolic  dysfunction (impaired relaxation). Elevated left ventricular end-diastolic  pressure.  2. Right ventricular systolic function is normal. The right ventricular  size is normal. There is normal pulmonary artery systolic pressure. The  estimated right ventricular systolic pressure is 03.4 mmHg.  3. Left atrial size was mildly dilated.  4. The pericardial effusion is posterior to the left ventricle. Trivial  pericardial effusion.  5. The mitral valve is abnormal. Trivial mitral valve regurgitation.  6. The aortic valve is tricuspid. Aortic valve regurgitation is not  visualized. Mild aortic valve sclerosis is present, with no evidence of  aortic valve stenosis.  7. The inferior vena cava is normal in size with greater than 50%  respiratory variability, suggesting right atrial pressure of 3 mmHg.  Past Medical History:  Diagnosis Date  . Arthritis    knees,  . Breast cancer ([place]) 1999   right mastectomy  . Breast cancer, left ([place]) 05/2019   left lumpectomy  . Diabetes mellitus   . Dislocation of left shoulder joint   . Family history of prostate cancer   . Hyperlipidemia   . Hypertension   . Memory loss   . Numbness and tingling of right lower extremity   . Paresthesia   . Stroke [facility])    light stroke - 2012 ,right leg nerve damage     Past Surgical History:  Procedure Laterality Date  . ABDOMINAL HYSTERECTOMY    . APPENDECTOMY    . BREAST BIOPSY Left 01/19/2010  . BREAST LUMPECTOMY WITH RADIOACTIVE SEED  LOCALIZATION Left 07/17/2019   Procedure: LEFT BREAST LUMPECTOMY WITH RADIOACTIVE SEED LOCALIZATION;  Surgeon: Johnathan Hausen, MD;  Location: [place];  Service: General;  Laterality: Left;  . BREAST SURGERY     Right mastectomy 1999  . CARPAL TUNNEL RELEASE Right 09/30/2014   Procedure: RIGHT CARPAL TUNNEL RELEASE;  Surgeon: Daryll Brod, MD;  Location: [place];  Service: Orthopedics;  Laterality: Right;  .  COLONOSCOPY    . EYE SURGERY     cataracts  . JOINT REPLACEMENT     lt total knee 2011  . LIPOMA EXCISION  11/28/2011   Procedure: EXCISION LIPOMA;  Surgeon: Pedro Earls, MD;  Location: [place];  Service: General;  Laterality: N/A;  excisino lipoma 4 cm back of neck  . MASTECTOMY Right 1999    MEDICATIONS: No current facility-administered medications for this encounter.   Marland Kitchen atorvastatin (LIPITOR) 80 MG tablet  . brimonidine-timolol (COMBIGAN) 0.2-0.5 % ophthalmic solution  . calcium carbonate (OSCAL) 1500 (600 Ca) MG TABS tablet  . dorzolamide (TRUSOPT) 2 % ophthalmic solution  . hydrochlorothiazide (MICROZIDE) 12.5 MG capsule  . latanoprost (XALATAN) 0.005 % ophthalmic solution  . lisinopril (ZESTRIL) 20 MG tablet  . metFORMIN (GLUCOPHAGE) 500 MG tablet  . pregabalin (LYRICA) 50 MG capsule  . vitamin B-12 (CYANOCOBALAMIN) 1000 MCG tablet  . vitamin C (ASCORBIC ACID) 500 MG tablet  . apixaban (ELIQUIS) 5 MG TABS tablet  . diltiazem (CARDIZEM CD) 240 MG 24 hr capsule  . meloxicam (MOBIC) 15 MG tablet    Konrad Felix, PA-C [facility] Pre-Surgical Testing [phone]

## 2021-05-04 NOTE — Anesthesia Preprocedure Evaluation (Addendum)
Anesthesia Evaluation  Patient identified by MRN, date of birth, ID band Patient awake    Reviewed: Allergy & Precautions, NPO status , Patient's Chart, lab work & pertinent test results  History of Anesthesia Complications Negative for: history of anesthetic complications  Airway Mallampati: II  TM Distance: >3 FB Neck ROM: Full    Dental  (+) Poor Dentition, Chipped, Missing, Dental Advisory Given   Pulmonary neg pulmonary ROS,  05/08/2021 SARS coronavirus NEG   breath sounds clear to auscultation       Cardiovascular hypertension, Pt. on medications (-) angina Rhythm:Regular Rate:Normal  03/2021 ECHO: EF>75%. The left ventricle has hyperdynamic function, with no regional wall motion  Abnormalities, severe LVH. Grade I DD, no significant valvular abnormalities   Neuro/Psych CVA (RLE weakness), Residual Symptoms    GI/Hepatic negative GI ROS, Neg liver ROS,   Endo/Other  diabetes (glu 103), Oral Hypoglycemic Agents  Renal/GU negative Renal ROS     Musculoskeletal  (+) Arthritis ,   Abdominal   Peds  Hematology eliquis   Anesthesia Other Findings Breast cancer  Reproductive/Obstetrics                           Anesthesia Physical Anesthesia Plan  ASA: III  Anesthesia Plan: General   Post-op Pain Management: GA combined w/ Regional for post-op pain   Induction:   PONV Risk Score and Plan: 3 and Ondansetron, Dexamethasone and Treatment may vary due to age or medical condition  Airway Management Planned: Oral ETT  Additional Equipment: None  Intra-op Plan:   Post-operative Plan: Extubation in OR  Informed Consent: I have reviewed the patients History and Physical, chart, labs and discussed the procedure including the risks, benefits and alternatives for the proposed anesthesia with the patient or authorized representative who has indicated his/her understanding and acceptance.      Dental advisory given  Plan Discussed with: CRNA and Surgeon  Anesthesia Plan Comments: (See PAT note 05/04/2021, Konrad Felix, PA-C Plan routine monitors, GA with pectoralis block for post-op analgesia)      Anesthesia Quick Evaluation

## 2021-05-08 ENCOUNTER — Other Ambulatory Visit (HOSPITAL_COMMUNITY)
Admission: RE | Admit: 2021-05-08 | Discharge: 2021-05-08 | Disposition: A | Discharge status: Home / Self Care | Payer: Medicare PPO | Source: Ambulatory Visit | Attending: Surgery | Admitting: Surgery

## 2021-05-08 DIAGNOSIS — Z20822 Contact with and (suspected) exposure to covid-19: Secondary | ICD-10-CM | POA: Diagnosis not present

## 2021-05-08 DIAGNOSIS — Z01812 Encounter for preprocedural laboratory examination: Secondary | ICD-10-CM | POA: Diagnosis not present

## 2021-05-08 LAB — SARS CORONAVIRUS 2 (TAT 6-24 HRS): SARS Coronavirus 2: NEGATIVE

## 2021-05-09 MED ORDER — BUPIVACAINE LIPOSOME 1.3 % IJ SUSP
20.0000 mL | INTRAMUSCULAR | Status: DC
  Filled 2021-05-09: qty 20

## 2021-05-10 ENCOUNTER — Encounter (HOSPITAL_COMMUNITY)
Admission: RE | Disposition: A | Discharge status: Home / Self Care | Payer: Self-pay | Source: Home / Self Care | Attending: Surgery

## 2021-05-10 ENCOUNTER — Encounter (HOSPITAL_COMMUNITY): Payer: Self-pay | Admitting: Surgery

## 2021-05-10 ENCOUNTER — Observation Stay (HOSPITAL_COMMUNITY)
Admission: RE | Admit: 2021-05-10 | Discharge: 2021-05-11 | Disposition: A | Discharge status: Home / Self Care | Payer: Medicare PPO | Attending: Surgery | Admitting: Surgery

## 2021-05-10 ENCOUNTER — Ambulatory Visit (HOSPITAL_COMMUNITY): Payer: Medicare PPO | Admitting: Anesthesiology

## 2021-05-10 ENCOUNTER — Other Ambulatory Visit: Payer: Self-pay

## 2021-05-10 DIAGNOSIS — Z853 Personal history of malignant neoplasm of breast: Secondary | ICD-10-CM | POA: Insufficient documentation

## 2021-05-10 DIAGNOSIS — Z800 Family history of malignant neoplasm of digestive organs: Secondary | ICD-10-CM | POA: Diagnosis not present

## 2021-05-10 DIAGNOSIS — Z96652 Presence of left artificial knee joint: Secondary | ICD-10-CM | POA: Diagnosis not present

## 2021-05-10 DIAGNOSIS — D0510 Intraductal carcinoma in situ of unspecified breast: Secondary | ICD-10-CM | POA: Diagnosis present

## 2021-05-10 DIAGNOSIS — I4891 Unspecified atrial fibrillation: Secondary | ICD-10-CM | POA: Diagnosis not present

## 2021-05-10 DIAGNOSIS — I10 Essential (primary) hypertension: Secondary | ICD-10-CM | POA: Insufficient documentation

## 2021-05-10 DIAGNOSIS — E119 Type 2 diabetes mellitus without complications: Secondary | ICD-10-CM | POA: Insufficient documentation

## 2021-05-10 DIAGNOSIS — N641 Fat necrosis of breast: Secondary | ICD-10-CM | POA: Diagnosis not present

## 2021-05-10 DIAGNOSIS — D0512 Intraductal carcinoma in situ of left breast: Secondary | ICD-10-CM | POA: Diagnosis not present

## 2021-05-10 DIAGNOSIS — Z7984 Long term (current) use of oral hypoglycemic drugs: Secondary | ICD-10-CM | POA: Diagnosis not present

## 2021-05-10 DIAGNOSIS — C50912 Malignant neoplasm of unspecified site of left female breast: Secondary | ICD-10-CM | POA: Diagnosis not present

## 2021-05-10 DIAGNOSIS — Z170 Estrogen receptor positive status [ER+]: Secondary | ICD-10-CM | POA: Diagnosis not present

## 2021-05-10 DIAGNOSIS — N6489 Other specified disorders of breast: Secondary | ICD-10-CM | POA: Diagnosis not present

## 2021-05-10 DIAGNOSIS — G8918 Other acute postprocedural pain: Secondary | ICD-10-CM | POA: Diagnosis not present

## 2021-05-10 HISTORY — PX: TOTAL MASTECTOMY: SHX6129

## 2021-05-10 LAB — GLUCOSE, CAPILLARY
Glucose-Capillary: 103 mg/dL — ABNORMAL HIGH (ref 70–99)
Glucose-Capillary: 98 mg/dL (ref 70–99)
Glucose-Capillary: 155 mg/dL — ABNORMAL HIGH (ref 70–99)

## 2021-05-10 LAB — CREATININE, SERUM
Creatinine, Ser: 0.66 mg/dL (ref 0.44–1.00)
GFR, Estimated: >60 mL/min (ref >60)

## 2021-05-10 LAB — CBC
HCT: 32.5 % — ABNORMAL LOW (ref 36.0–46.0)
Hemoglobin: 10.6 g/dL — ABNORMAL LOW (ref 12.0–15.0)
MCH: 29.4 pg (ref 26.0–34.0)
MCHC: 32.6 g/dL (ref 30.0–36.0)
MCV: 90.3 fL (ref 80.0–100.0)
Platelets: 213 10*3/uL (ref 150–400)
RBC: 3.6 MIL/uL — ABNORMAL LOW (ref 3.87–5.11)
RDW: 14.7 % (ref 11.5–15.5)
WBC: 7.5 10*3/uL (ref 4.0–10.5)
nRBC: 0 % (ref 0.0–0.2)

## 2021-05-10 SURGERY — MASTECTOMY, SIMPLE
Anesthesia: General | Site: Breast | Laterality: Left

## 2021-05-10 MED ORDER — LIDOCAINE HCL 2 % IJ SOLN
INTRAMUSCULAR | Status: AC
  Filled 2021-05-10: qty 20

## 2021-05-10 MED ORDER — FENTANYL CITRATE (PF) 250 MCG/5ML IJ SOLN
INTRAMUSCULAR | Status: AC
  Filled 2021-05-10: qty 5

## 2021-05-10 MED ORDER — FENTANYL CITRATE (PF) 100 MCG/2ML IJ SOLN
50.0000 ug | INTRAMUSCULAR | Status: DC
Start: 2021-05-10 — End: 2021-05-10

## 2021-05-10 MED ORDER — CEFAZOLIN SODIUM-DEXTROSE 2-4 GM/100ML-% IV SOLN
2.0000 g | Freq: Three times a day (TID) | INTRAVENOUS | Status: AC
  Administered 2021-05-10: 2 g via INTRAVENOUS
  Filled 2021-05-10: qty 100

## 2021-05-10 MED ORDER — MIDAZOLAM HCL 2 MG/2ML IJ SOLN
INTRAMUSCULAR | Status: AC
  Filled 2021-05-10: qty 2

## 2021-05-10 MED ORDER — ROCURONIUM BROMIDE 10 MG/ML (PF) SYRINGE
PREFILLED_SYRINGE | INTRAVENOUS | Status: AC
  Filled 2021-05-10: qty 10

## 2021-05-10 MED ORDER — CHLORHEXIDINE GLUCONATE CLOTH 2 % EX PADS: 6.0000 | MEDICATED_PAD | Freq: Once | CUTANEOUS | Status: DC

## 2021-05-10 MED ORDER — SUGAMMADEX SODIUM 200 MG/2ML IV SOLN
INTRAVENOUS | Status: DC | PRN
  Administered 2021-05-10: 150 mg via INTRAVENOUS

## 2021-05-10 MED ORDER — ACETAMINOPHEN 500 MG PO TABS: 1000.0000 mg | ORAL_TABLET | Freq: Once | ORAL | Status: DC

## 2021-05-10 MED ORDER — ROCURONIUM BROMIDE 100 MG/10ML IV SOLN
INTRAVENOUS | Status: DC | PRN
  Administered 2021-05-10: 60 mg via INTRAVENOUS

## 2021-05-10 MED ORDER — METOPROLOL TARTRATE 5 MG/5ML IV SOLN: 5.0000 mg | Freq: Four times a day (QID) | INTRAVENOUS | Status: DC | PRN

## 2021-05-10 MED ORDER — FENTANYL CITRATE (PF) 100 MCG/2ML IJ SOLN
INTRAMUSCULAR | Status: AC
  Filled 2021-05-10: qty 2

## 2021-05-10 MED ORDER — DEXAMETHASONE SODIUM PHOSPHATE 10 MG/ML IJ SOLN
INTRAMUSCULAR | Status: AC
  Filled 2021-05-10: qty 2

## 2021-05-10 MED ORDER — HEPARIN SODIUM (PORCINE) 5000 UNIT/ML IJ SOLN
5000.0000 [IU] | Freq: Once | INTRAMUSCULAR | Status: AC
  Administered 2021-05-10: 5000 [IU] via SUBCUTANEOUS
  Filled 2021-05-10: qty 1

## 2021-05-10 MED ORDER — ONDANSETRON HCL 4 MG/2ML IJ SOLN
INTRAMUSCULAR | Status: DC | PRN
  Administered 2021-05-10: 4 mg via INTRAVENOUS

## 2021-05-10 MED ORDER — HEPARIN SODIUM (PORCINE) 5000 UNIT/ML IJ SOLN: 5000.0000 [IU] | Freq: Three times a day (TID) | INTRAMUSCULAR | Status: DC

## 2021-05-10 MED ORDER — FENTANYL CITRATE (PF) 100 MCG/2ML IJ SOLN: 25.0000 ug | INTRAMUSCULAR | Status: DC | PRN

## 2021-05-10 MED ORDER — SCOPOLAMINE 1 MG/3DAYS TD PT72
1.0000 | MEDICATED_PATCH | TRANSDERMAL | Status: DC
  Administered 2021-05-10: 1.5 mg via TRANSDERMAL
  Filled 2021-05-10: qty 1

## 2021-05-10 MED ORDER — PROPOFOL 10 MG/ML IV BOLUS
INTRAVENOUS | Status: DC | PRN
  Administered 2021-05-10: 80 mg via INTRAVENOUS
  Administered 2021-05-10: 20 mg via INTRAVENOUS

## 2021-05-10 MED ORDER — BUPIVACAINE-EPINEPHRINE (PF) 0.5% -1:200000 IJ SOLN
INTRAMUSCULAR | Status: DC | PRN
  Administered 2021-05-10: 30 mL

## 2021-05-10 MED ORDER — PROPOFOL 10 MG/ML IV BOLUS
INTRAVENOUS | Status: AC
  Filled 2021-05-10: qty 20

## 2021-05-10 MED ORDER — KCL IN DEXTROSE-NACL 20-5-0.45 MEQ/L-%-% IV SOLN
INTRAVENOUS | Status: DC
  Filled 2021-05-10: qty 1000

## 2021-05-10 MED ORDER — LACTATED RINGERS IV SOLN: INTRAVENOUS | Status: DC

## 2021-05-10 MED ORDER — OXYCODONE HCL 5 MG PO TABS
5.0000 mg | ORAL_TABLET | ORAL | Status: DC | PRN
  Filled 2021-05-10: qty 2

## 2021-05-10 MED ORDER — 0.9 % SODIUM CHLORIDE (POUR BTL) OPTIME
TOPICAL | Status: DC | PRN
  Administered 2021-05-10: 1000 mL

## 2021-05-10 MED ORDER — LIDOCAINE HCL (CARDIAC) PF 50 MG/5ML IV SOSY
PREFILLED_SYRINGE | INTRAVENOUS | Status: DC | PRN
  Administered 2021-05-10: 75 mg via INTRAVENOUS

## 2021-05-10 MED ORDER — INSULIN ASPART 100 UNIT/ML IJ SOLN
0.0000 [IU] | INTRAMUSCULAR | Status: DC
  Administered 2021-05-10: 3 [IU] via SUBCUTANEOUS
  Administered 2021-05-11 (×2): 5 [IU] via SUBCUTANEOUS

## 2021-05-10 MED ORDER — ONDANSETRON HCL 4 MG/2ML IJ SOLN
INTRAMUSCULAR | Status: AC
  Filled 2021-05-10: qty 2

## 2021-05-10 MED ORDER — ONDANSETRON 4 MG PO TBDP: 4.0000 mg | ORAL_TABLET | Freq: Four times a day (QID) | ORAL | Status: DC | PRN

## 2021-05-10 MED ORDER — LIDOCAINE 2% (20 MG/ML) 5 ML SYRINGE
INTRAMUSCULAR | Status: DC | PRN
  Administered 2021-05-10: 1 mg/kg/h via INTRAVENOUS

## 2021-05-10 MED ORDER — DEXAMETHASONE SODIUM PHOSPHATE 10 MG/ML IJ SOLN
INTRAMUSCULAR | Status: DC | PRN
  Administered 2021-05-10: 6 mg via INTRAVENOUS

## 2021-05-10 MED ORDER — ONDANSETRON HCL 4 MG/2ML IJ SOLN: 4.0000 mg | Freq: Four times a day (QID) | INTRAMUSCULAR | Status: DC | PRN

## 2021-05-10 MED ORDER — ACETAMINOPHEN 500 MG PO TABS
1000.0000 mg | ORAL_TABLET | ORAL | Status: AC
  Administered 2021-05-10: 1000 mg via ORAL
  Filled 2021-05-10: qty 2

## 2021-05-10 MED ORDER — FENTANYL CITRATE (PF) 100 MCG/2ML IJ SOLN
INTRAMUSCULAR | Status: DC | PRN
  Administered 2021-05-10 (×4): 50 ug via INTRAVENOUS

## 2021-05-10 MED ORDER — CEFAZOLIN SODIUM-DEXTROSE 2-4 GM/100ML-% IV SOLN
2.0000 g | INTRAVENOUS | Status: AC
  Administered 2021-05-10: 2 g via INTRAVENOUS
  Filled 2021-05-10: qty 100

## 2021-05-10 MED ORDER — PANTOPRAZOLE SODIUM 40 MG IV SOLR
40.0000 mg | Freq: Every day | INTRAVENOUS | Status: DC
  Administered 2021-05-10: 40 mg via INTRAVENOUS
  Filled 2021-05-10: qty 40

## 2021-05-10 SURGICAL SUPPLY — 36 items
ADH SKN CLS APL DERMABOND .7 (GAUZE/BANDAGES/DRESSINGS) ×2
APL SKNCLS STERI-STRIP NONHPOA (GAUZE/BANDAGES/DRESSINGS)
APPLIER CLIP 9.375 MED OPEN (MISCELLANEOUS)
APR CLP MED 9.3 20 MLT OPN (MISCELLANEOUS)
BENZOIN TINCTURE PRP APPL 2/3 (GAUZE/BANDAGES/DRESSINGS) IMPLANT
BINDER BREAST LRG (GAUZE/BANDAGES/DRESSINGS) ×1 IMPLANT
BINDER BREAST XLRG (GAUZE/BANDAGES/DRESSINGS) IMPLANT
BIOPATCH WHT 1IN DISK W/4.0 H (GAUZE/BANDAGES/DRESSINGS) ×2 IMPLANT
CLIP APPLIE 9.375 MED OPEN (MISCELLANEOUS) IMPLANT
COVER WAND RF STERILE (DRAPES) IMPLANT
DERMABOND ADVANCED (GAUZE/BANDAGES/DRESSINGS) ×2
DERMABOND ADVANCED .7 DNX12 (GAUZE/BANDAGES/DRESSINGS) IMPLANT
DRAIN CHANNEL 19F RND (DRAIN) ×1 IMPLANT
DRAPE LAPAROSCOPIC ABDOMINAL (DRAPES) ×2 IMPLANT
DRSG PAD ABDOMINAL 8X10 ST (GAUZE/BANDAGES/DRESSINGS) ×1 IMPLANT
DRSG TEGADERM 4X4.75 (GAUZE/BANDAGES/DRESSINGS) ×1 IMPLANT
ELECT REM PT RETURN 15FT ADLT (MISCELLANEOUS) ×2 IMPLANT
EVACUATOR SILICONE 100CC (DRAIN) ×2 IMPLANT
GAUZE SPONGE 4X4 12PLY STRL (GAUZE/BANDAGES/DRESSINGS) ×2 IMPLANT
GLOVE BIOGEL M 8.0 STRL (GLOVE) ×2 IMPLANT
GOWN STRL REUS W/TWL XL LVL3 (GOWN DISPOSABLE) ×4 IMPLANT
KIT BASIN OR (CUSTOM PROCEDURE TRAY) ×2 IMPLANT
KIT MARKER MARGIN INK (KITS) ×2 IMPLANT
KIT TURNOVER KIT A (KITS) ×2 IMPLANT
MARKER SKIN DUAL TIP RULER LAB (MISCELLANEOUS) ×2 IMPLANT
NEEDLE HYPO 22GX1.5 SAFETY (NEEDLE) IMPLANT
PACK GENERAL/GYN (CUSTOM PROCEDURE TRAY) ×2 IMPLANT
PENCIL SMOKE EVACUATOR (MISCELLANEOUS) ×1 IMPLANT
SCRUB TECHNI CARE 4 OZ NO DYE (MISCELLANEOUS) ×1 IMPLANT
STRIP CLOSURE SKIN 1/2X4 (GAUZE/BANDAGES/DRESSINGS) IMPLANT
SUT ETHILON 2 0 PS N (SUTURE) ×1 IMPLANT
SUT MNCRL AB 4-0 PS2 18 (SUTURE) ×1 IMPLANT
SUT VIC AB 3-0 SH 18 (SUTURE) ×1 IMPLANT
SUT VIC AB 3-0 SH 8-18 (SUTURE) ×1 IMPLANT
SUT VIC AB 4-0 SH 18 (SUTURE) ×2 IMPLANT
SYR 20ML LL LF (SYRINGE) IMPLANT

## 2021-05-10 NOTE — Anesthesia Postprocedure Evaluation (Signed)
Anesthesia Post Note  Patient: Amanda Green  Procedure(s) Performed: LEFT TOTAL MASTECTOMY (Left Breast)     Patient location during evaluation: PACU Anesthesia Type: General Level of consciousness: awake and alert, patient cooperative and oriented Pain management: pain level controlled Vital Signs Assessment: post-procedure vital signs reviewed and stable Respiratory status: spontaneous breathing, nonlabored ventilation and respiratory function stable Cardiovascular status: blood pressure returned to baseline and stable Postop Assessment: no apparent nausea or vomiting and adequate PO intake Anesthetic complications: no   No complications documented.  Last Vitals:  Vitals:   05/10/21 1615 05/10/21 1630  BP: (!) 161/66 (!) 160/87  Pulse: 80 74  Resp: 14 12  Temp: 36.8 C   SpO2: 100% 94%    Last Pain:  Vitals:   05/10/21 1630  TempSrc:   PainSc: 0-No pain                 Chevonne Bostrom,E. Batsheva Stevick

## 2021-05-10 NOTE — H&P (Signed)
Chief Complaint:  Left multifocal DCIS  History of Present Illness:  Amanda Green is an 86 y.o. female who had a right mastectomy in 1999.  She now has left multifocal DCIS and presents for left mastectomy.    Past Medical History:  Diagnosis Date  . Arthritis    knees,  . Breast cancer ([place]) 1999   right mastectomy  . Breast cancer, left ([place]) 05/2019   left lumpectomy  . Diabetes mellitus   . Dislocation of left shoulder joint   . Family history of prostate cancer   . Hyperlipidemia   . Hypertension   . Memory loss   . Numbness and tingling of right lower extremity   . Paresthesia   . Stroke [facility])    light stroke - 2012 ,right leg nerve damage     Past Surgical History:  Procedure Laterality Date  . ABDOMINAL HYSTERECTOMY    . APPENDECTOMY    . BREAST BIOPSY Left 01/19/2010  . BREAST LUMPECTOMY WITH RADIOACTIVE SEED LOCALIZATION Left 07/17/2019   Procedure: LEFT BREAST LUMPECTOMY WITH RADIOACTIVE SEED LOCALIZATION;  Surgeon: Johnathan Hausen, MD;  Location: [facility];  Service: General;  Laterality: Left;  . BREAST SURGERY     Right mastectomy 1999  . CARPAL TUNNEL RELEASE Right 09/30/2014   Procedure: RIGHT CARPAL TUNNEL RELEASE;  Surgeon: Daryll Brod, MD;  Location: [facility];  Service: Orthopedics;  Laterality: Right;  . COLONOSCOPY    . EYE SURGERY     cataracts  . JOINT REPLACEMENT     lt total knee 2011  . LIPOMA EXCISION  11/28/2011   Procedure: EXCISION LIPOMA;  Surgeon: Pedro Earls, MD;  Location: [facility];  Service: General;  Laterality: N/A;  excisino lipoma 4 cm back of neck  . MASTECTOMY Right 1999    Current Facility-Administered Medications  Medication Dose Route Frequency Provider Last Rate Last Admin  . acetaminophen (TYLENOL) tablet 1,000 mg  1,000 mg Oral Once Annye Asa, MD      . bupivacaine liposome (EXPAREL) 1.3 % injection 266 mg  20 mL Infiltration On Call to OR Johnathan Hausen,  MD      . ceFAZolin (ANCEF) IVPB 2g/100 mL premix  2 g Intravenous On Call to OR Johnathan Hausen, MD      . Chlorhexidine Gluconate Cloth 2 % PADS 6 each  6 each Topical Once Johnathan Hausen, MD       And  . Chlorhexidine Gluconate Cloth 2 % PADS 6 each  6 each Topical Once Johnathan Hausen, MD      . fentaNYL (SUBLIMAZE) 100 MCG/2ML injection           . fentaNYL (SUBLIMAZE) injection 50-100 mcg  50-100 mcg Intravenous UD Annye Asa, MD      . lactated ringers infusion   Intravenous Continuous Audry Pili, MD 10 mL/hr at 05/10/21 0900 New Bag at 05/10/21 0900  . scopolamine (TRANSDERM-SCOP) 1 MG/3DAYS 1.5 mg  1 patch Transdermal On Call to OR Johnathan Hausen, MD   1.5 mg at 05/10/21 1117   Gabapentin Family History  Problem Relation Age of Onset  . Esophageal cancer Mother        smoker  . Other Father        Unsure of medical history.  . Prostate cancer Brother        d. 6s  . Cancer Son        ?? unsure, but may have had cancer  .  Cancer Son        ?? unsure, but may have had cancer   Social History:   reports that she has never smoked. She has never used smokeless tobacco. She reports that she does not drink alcohol and does not use drugs.   REVIEW OF SYSTEMS : Negative except for see problem list  Physical Exam:   Blood pressure (!) 150/73, pulse 67, temperature 98.4 F (36.9 C), temperature source Oral, resp. rate 16, height 5' (1.524 m), weight 64 kg, SpO2 100 %. Body mass index is 27.54 kg/m.  Gen:  WDWN AAF NAD  Neurological: Alert and oriented to person, place, and time. Motor and sensory function is grossly intact  Head: Normocephalic and atraumatic.  Eyes: Conjunctivae are normal. Pupils are equal, round, and reactive to light. No scleral icterus.  Neck: Normal range of motion. Neck supple. No tracheal deviation or thyromegaly present.  Cardiovascular:  SR without murmurs or gallops.  No carotid bruits Breast:  Right breast absent;  Left breast with no  masses Respiratory: Effort normal.  No respiratory distress. No chest wall tenderness. Breath sounds normal.  No wheezes, rales or rhonchi.  Abdomen:  nontender GU:  Not examined Musculoskeletal: Normal range of motion. Extremities are nontender. No cyanosis, edema or clubbing noted Lymphadenopathy: No cervical, preauricular, postauricular or axillary adenopathy is present Skin: Skin is warm and dry. No rash noted. No diaphoresis. No erythema. No pallor. Pscyh: Normal mood and affect. Behavior is normal. Judgment and thought content normal.   LABORATORY RESULTS: Results for orders placed or performed during the hospital encounter of 05/10/21 (from the past 48 hour(s))  Glucose, capillary     Status: Abnormal   Collection Time: 05/10/21  7:00 AM  Result Value Ref Range   Glucose-Capillary 103 (H) 70 - 99 mg/dL    Comment: Glucose reference range applies only to samples taken after fasting for at least 8 hours.   Comment 1 Notify RN      RADIOLOGY RESULTS: No results found.  Problem List: Patient Active Problem List   Diagnosis Date Noted  . New onset atrial fibrillation ([place]) 04/14/2021  . Breast cancer ([place]) 04/14/2021  . Essential hypertension 04/14/2021  . Diabetes mellitus type 2, controlled ([place]) 04/14/2021  . Cerebrovascular accident (CVA) ([place]) 02/07/2021  . Cognitive impairment 02/07/2021  . Genetic testing 09/25/2019  . Family history of prostate cancer   . Ductal carcinoma in situ (DCIS) of left breast 06/10/2019  . Pain 08/13/2018  . Trigger ring finger of left hand 08/13/2018  . Paresthesia of right lower extremity 01/28/2017  . Stroke ([place]) 01/28/2017  . Colon polyps 08/15/2016  . Chronic right shoulder pain 11/24/2015  . Right mastectomy 1999 03/26/2012    Assessment & Plan: Left breast with multifocal DCIS for mastectomy.      Matt B. Hassell Done, MD, [facility] [phone] beeper [phone]  05/10/2021 1:44 PM

## 2021-05-10 NOTE — Anesthesia Procedure Notes (Signed)
Procedure Name: Intubation Date/Time: 05/10/2021 2:10 PM Performed by: Lissa Morales, CRNA Pre-anesthesia Checklist: Patient identified, Emergency Drugs available, Suction available and Patient being monitored Patient Re-evaluated:Patient Re-evaluated prior to induction Oxygen Delivery Method: Circle system utilized Preoxygenation: Pre-oxygenation with 100% oxygen Induction Type: IV induction Ventilation: Mask ventilation without difficulty Laryngoscope Size: Glidescope and 3 Grade View: Grade III Tube type: Oral Tube size: 7.0 mm Number of attempts: 2 Airway Equipment and Method: Stylet and Oral airway Placement Confirmation: ETT inserted through vocal cords under direct vision,  positive ETCO2 and breath sounds checked- equal and bilateral Secured at: 21 cm Tube secured with: Tape Dental Injury: Teeth and Oropharynx as per pre-operative assessment  Difficulty Due To: Difficulty was anticipated, Difficult Airway- due to anterior larynx and Difficult Airway- due to dentition Comments: Protruding upper and lower teeth., small mouth , irregularly placed teeth

## 2021-05-10 NOTE — Anesthesia Procedure Notes (Signed)
Anesthesia Regional Block: Pectoralis block   Pre-Anesthetic Checklist: ,, timeout performed, Correct Patient, Correct Site, Correct Laterality, Correct Procedure, Correct Position, site marked, Risks and benefits discussed,  Surgical consent,  Pre-op evaluation,  At surgeon's request and post-op pain management  Laterality: Left  Prep: chloraprep       Needles:  Injection technique: Single-shot  Needle Type: Echogenic Needle     Needle Length: 9cm  Needle Gauge: 21     Additional Needles:   Procedures:,,,, ultrasound used (permanent image in chart),,,,  Narrative:  Start time: 05/10/2021 1:31 PM End time: 05/10/2021 1:37 PM Injection made incrementally with aspirations every 5 mL.  Performed by: Personally  Anesthesiologist: Annye Asa, MD  Additional Notes: Pt identified in Holding room.  Monitors applied. Working IV access confirmed. Sterile prep L pectoralis and clavicle.  #21ga ECHOgenic Arrow block needle between pec major and serratus, ribs 4,5 with US guidance.  30cc 0.5% Bupivacaine with 1:200k epi injected incrementally after negative test dose.  Patient asymptomatic, VSS, no heme aspirated, tolerated well.  Jenita Seashore, MD

## 2021-05-10 NOTE — Op Note (Signed)
Amanda Green  1935/04/04   05/10/2021    PCP:  Alroy Dust, L.[name], MD   Surgeon: Kaylyn Lim, MD, FACS  Asst:  none  Anes:  general  Preop Dx: High grade DCIS in left breast -  multifocal Postop Dx: same  Procedure: Left total mastectomy Location Surgery: [facility] 4 Complications: none  EBL:   minimal cc  Drains: 19 blake drain in the left axilla  Description of Procedure:  The patient was taken to OR 4 .  After anesthesia was administered and the patient was prepped  with chloroprep  and a timeout was performed.  The left breast which is the surviving breast was marked and superior and inferior flaps were described with a indelible marker prior to being incised with a knife.  A superior flap was created using the Bovie and later the inferior flap was created with the Bovie and then laterally flaps were created as well as medially.  This was carried down to the chest wall and muscle and the breast was then removed from the chest from medial to lateral and then along the pectoralis muscle.  The breast was swept off the chest wall and laterally was removed without difficulty.  The wound was carefully inspected and no bleeding points were noted.  The been 1 arterial bleeder which was ligated with a 3-0 Vicryl.  The flaps were then closed with 4-0 Vicryl with 3-0 Vicryl subcutaneously and 4-0 Monocryl.  Dermabond was used and then a breast compression garment was applied.  Patient tolerated procedure well was taken recovery in satisfactory condition.  The patient tolerated the procedure well and was taken to the PACU in stable condition.     Matt B. Hassell Done, [place], [facility], [place]

## 2021-05-10 NOTE — Transfer of Care (Signed)
Immediate Anesthesia Transfer of Care Note  Patient: Amanda Green  Procedure(s) Performed: LEFT TOTAL MASTECTOMY (Left Breast)  Patient Location: PACU  Anesthesia Type:General  Level of Consciousness: awake, alert , oriented and patient cooperative  Airway & Oxygen Therapy: Patient Spontanous Breathing and Patient connected to face mask oxygen  Post-op Assessment: Report given to RN, Post -op Vital signs reviewed and stable and Patient moving all extremities X 4  Post vital signs: stable  Last Vitals:  Vitals Value Taken Time  BP 155/67 05/10/21 1617  Temp    Pulse 69 05/10/21 1619  Resp 11 05/10/21 1619  SpO2 100 % 05/10/21 1619  Vitals shown include unvalidated device data.  Last Pain:  Vitals:   05/10/21 0650  TempSrc: Oral         Complications: No complications documented.

## 2021-05-10 NOTE — Interval H&P Note (Signed)
History and Physical Interval Note:  05/10/2021 1:46 PM  Amanda Green  has presented today for surgery, with the diagnosis of DIFFUSE DUCTAL CARCINOMA IN SITU.  The various methods of treatment have been discussed with the patient and family. After consideration of risks, benefits and other options for treatment, the patient has consented to  Procedure(s): LEFT TOTAL MASTECTOMY (Left) as a surgical intervention.  The patient's history has been reviewed, patient examined, no change in status, stable for surgery.  I have reviewed the patient's chart and labs.  Questions were answered to the patient's satisfaction.     Pedro Earls

## 2021-05-11 ENCOUNTER — Encounter (HOSPITAL_COMMUNITY): Payer: Self-pay | Admitting: Surgery

## 2021-05-11 DIAGNOSIS — E119 Type 2 diabetes mellitus without complications: Secondary | ICD-10-CM | POA: Diagnosis not present

## 2021-05-11 DIAGNOSIS — Z853 Personal history of malignant neoplasm of breast: Secondary | ICD-10-CM | POA: Diagnosis not present

## 2021-05-11 DIAGNOSIS — I4891 Unspecified atrial fibrillation: Secondary | ICD-10-CM | POA: Diagnosis not present

## 2021-05-11 DIAGNOSIS — C50912 Malignant neoplasm of unspecified site of left female breast: Secondary | ICD-10-CM | POA: Diagnosis not present

## 2021-05-11 DIAGNOSIS — Z96652 Presence of left artificial knee joint: Secondary | ICD-10-CM | POA: Diagnosis not present

## 2021-05-11 DIAGNOSIS — I10 Essential (primary) hypertension: Secondary | ICD-10-CM | POA: Diagnosis not present

## 2021-05-11 DIAGNOSIS — Z800 Family history of malignant neoplasm of digestive organs: Secondary | ICD-10-CM | POA: Diagnosis not present

## 2021-05-11 LAB — BASIC METABOLIC PANEL
Anion gap: 7 (ref 5–15)
BUN: 17 mg/dL (ref 8–23)
CO2: 25 mmol/L (ref 22–32)
Calcium: 9 mg/dL (ref 8.9–10.3)
Chloride: 107 mmol/L (ref 98–111)
Creatinine, Ser: 0.77 mg/dL (ref 0.44–1.00)
GFR, Estimated: >60 mL/min (ref >60)
Glucose, Bld: 204 mg/dL — ABNORMAL HIGH (ref 70–99)
Potassium: 4 mmol/L (ref 3.5–5.1)
Sodium: 139 mmol/L (ref 135–145)

## 2021-05-11 LAB — CBC
HCT: 29.9 % — ABNORMAL LOW (ref 36.0–46.0)
Hemoglobin: 9.9 g/dL — ABNORMAL LOW (ref 12.0–15.0)
MCH: 29.7 pg (ref 26.0–34.0)
MCHC: 33.1 g/dL (ref 30.0–36.0)
MCV: 89.8 fL (ref 80.0–100.0)
Platelets: 223 10*3/uL (ref 150–400)
RBC: 3.33 MIL/uL — ABNORMAL LOW (ref 3.87–5.11)
RDW: 14.6 % (ref 11.5–15.5)
WBC: 12 10*3/uL — ABNORMAL HIGH (ref 4.0–10.5)
nRBC: 0 % (ref 0.0–0.2)

## 2021-05-11 LAB — GLUCOSE, CAPILLARY
Glucose-Capillary: 241 mg/dL — ABNORMAL HIGH (ref 70–99)
Glucose-Capillary: 219 mg/dL — ABNORMAL HIGH (ref 70–99)
Glucose-Capillary: 112 mg/dL — ABNORMAL HIGH (ref 70–99)

## 2021-05-11 MED ORDER — HYDROCODONE-ACETAMINOPHEN 5-325 MG PO TABS: 1.0000 | ORAL_TABLET | Freq: Four times a day (QID) | ORAL | 0 refills | Status: DC | PRN

## 2021-05-11 NOTE — Progress Notes (Signed)
D/C instructions given to patient. Patient had no questions. NT or writer will wheel patient out once she is dressed

## 2021-05-11 NOTE — Discharge Instructions (Signed)
JP Drain Totals °· Bring this sheet to all of your post-operative appointments while you have your drains. °· Please measure your drains by CC's or ML's. °· Make sure you drain and measure your JP Drains 3 times per day. °· At the end of each day, add up totals for the left side and add up totals for the right side. °   ( 9 am )     ( 3 pm )        ( 9 pm )                °Date L  R  L  R  L  R  Total L/R  °               °               °               °               °               °               °               °               °               °               °               °               ° °

## 2021-05-11 NOTE — Discharge Summary (Signed)
Physician Discharge Summary  Patient ID: Amanda Green MRN: [id number] DOB/AGE: 03/21/1935 86 y.o.  PCP: Alroy Dust, L.[name], MD  Admit date: 05/10/2021 Discharge date: 05/11/2021  Admission Diagnoses:  High grade multifocal DCIS in left breast  Discharge Diagnoses:  Same path pending  Active Problems:   Ductal carcinoma in situ (DCIS) of breast   Surgery:  Left total mastectomy  Discharged Condition: improved  Hospital Course:   Had surgery on Wednesday.  Surgery was delayed until the afternoon.  Patient not ready for discharge until Thursday morning.  Doing well  Consults: none  Significant Diagnostic Studies: path pending    Discharge Exam: Blood pressure 129/65, pulse 82, temperature 98 F (36.7 C), temperature source Oral, resp. rate 18, height 5' (1.524 m), weight 64 kg, SpO2 98 %. Incision covered  JP in place  Disposition: Discharge disposition: 01-Home or Self Care       Discharge Instructions    Call MD for:  redness, tenderness, or signs of infection (pain, swelling, redness, odor or green/yellow discharge around incision site)   Complete by: As directed    Call MD for:  redness, tenderness, or signs of infection (pain, swelling, redness, odor or green/yellow discharge around incision site)   Complete by: As directed    Diet - low sodium heart healthy   Complete by: As directed    Diet - low sodium heart healthy   Complete by: As directed    Discharge instructions   Complete by: As directed    Need drain removed next week-Wednesday in my [place] office   Discharge instructions   Complete by: As directed    May start Eliquis on Monday.   Increase activity slowly   Complete by: As directed    Increase activity slowly   Complete by: As directed      Allergies as of 05/11/2021      Reactions   Gabapentin    Drowsiness      Medication List    STOP taking these medications   apixaban 5 MG Tabs tablet Commonly known as: ELIQUIS     TAKE  these medications   atorvastatin 80 MG tablet Commonly known as: LIPITOR Take 80 mg by mouth daily.   brimonidine-timolol 0.2-0.5 % ophthalmic solution Commonly known as: COMBIGAN Place 1 drop into both eyes every 12 (twelve) hours.   calcium carbonate 1500 (600 Ca) MG Tabs tablet Commonly known as: OSCAL Take 1,500 mg by mouth 2 (two) times daily with a meal.   diltiazem 240 MG 24 hr capsule Commonly known as: CARDIZEM CD Take 1 capsule (240 mg total) by mouth daily.   dorzolamide 2 % ophthalmic solution Commonly known as: TRUSOPT Place 1 drop into both eyes daily.   hydrochlorothiazide 12.5 MG capsule Commonly known as: MICROZIDE Take 12.5 mg by mouth daily.   HYDROcodone-acetaminophen 5-325 MG tablet Commonly known as: NORCO/VICODIN Take 1 tablet by mouth every 6 (six) hours as needed for moderate pain.   latanoprost 0.005 % ophthalmic solution Commonly known as: XALATAN Place 1 drop into both eyes at bedtime.   lisinopril 20 MG tablet Commonly known as: ZESTRIL Take 20 mg by mouth daily.   meloxicam 15 MG tablet Commonly known as: MOBIC Take 1 tablet (15 mg total) by mouth daily as needed for pain (To be taken only if needed.).   metFORMIN 500 MG tablet Commonly known as: GLUCOPHAGE Take 1,000 mg by mouth daily.   pregabalin 50 MG capsule Commonly known as: LYRICA Take 1  capsule (50 mg total) by mouth 3 (three) times daily.   vitamin B-12 1000 MCG tablet Commonly known as: CYANOCOBALAMIN Take 1,000 mcg by mouth daily.   vitamin C 500 MG tablet Commonly known as: ASCORBIC ACID Take 500 mg by mouth daily.       Follow-up Information    Johnathan Hausen, MD. Schedule an appointment as soon as possible for a visit in 1 week(s).   Specialty: General Surgery Why: For JP removal --see Dr. Hassell Done next week in the office Contact information: [address] [place] [phone]               Signed: Pedro Earls 05/11/2021, 2:16 PM

## 2021-05-17 ENCOUNTER — Telehealth: Payer: Self-pay | Admitting: Oncology

## 2021-05-17 NOTE — Telephone Encounter (Signed)
Rescheduled appointment per provider. Patient is aware. 

## 2021-05-18 LAB — SURGICAL PATHOLOGY

## 2021-06-21 ENCOUNTER — Ambulatory Visit: Payer: Medicare PPO | Admitting: Podiatry

## 2021-06-27 IMAGING — MG MM BREAST BX W LOC DEV EA AD LESION IMG BX SPEC STEREO GUIDE*L*
7 of 11 series · 7 of 11 positions shown · non-contrast
Comparison: Previous exams.
COMPARISON: Previous exams.

Addendum:
CLINICAL DATA: 86-year-old female with suspicious left breast
calcifications.

EXAM:
LEFT BREAST STEREOTACTIC CORE NEEDLE BIOPSY x2

[L (1 of 7)]
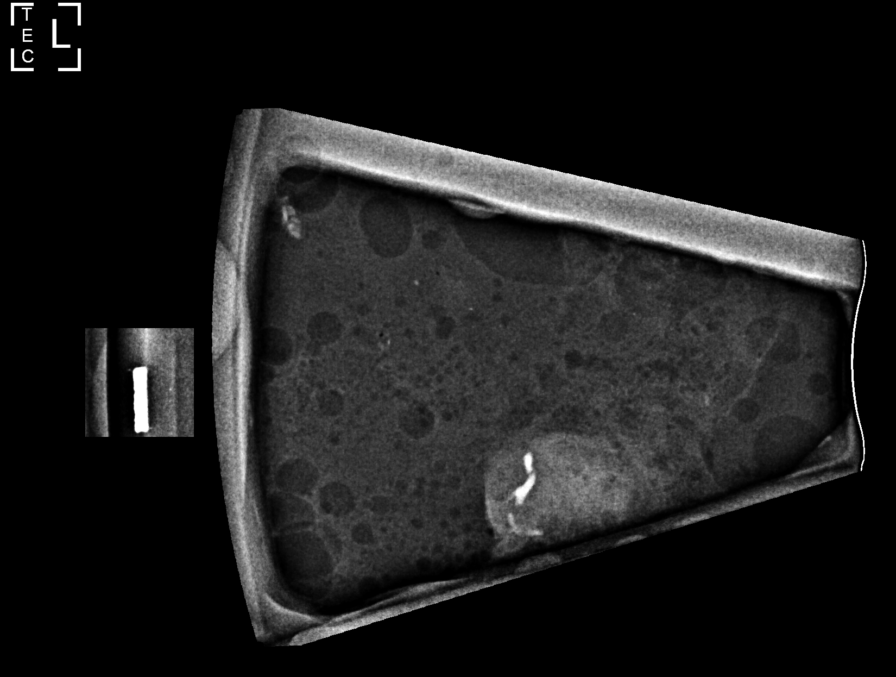

[L (2 of 7)]
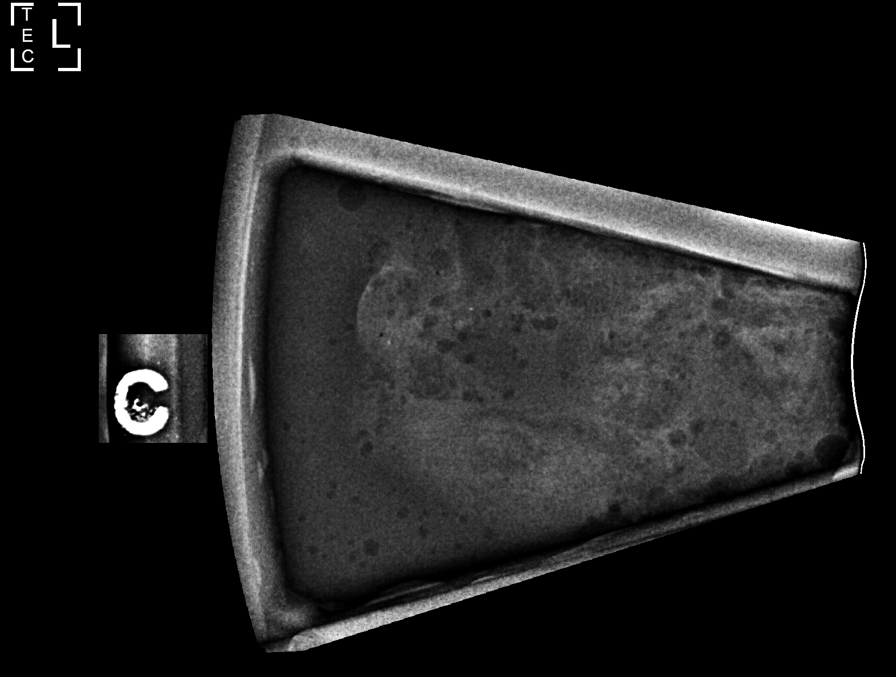

[L (3 of 7)]
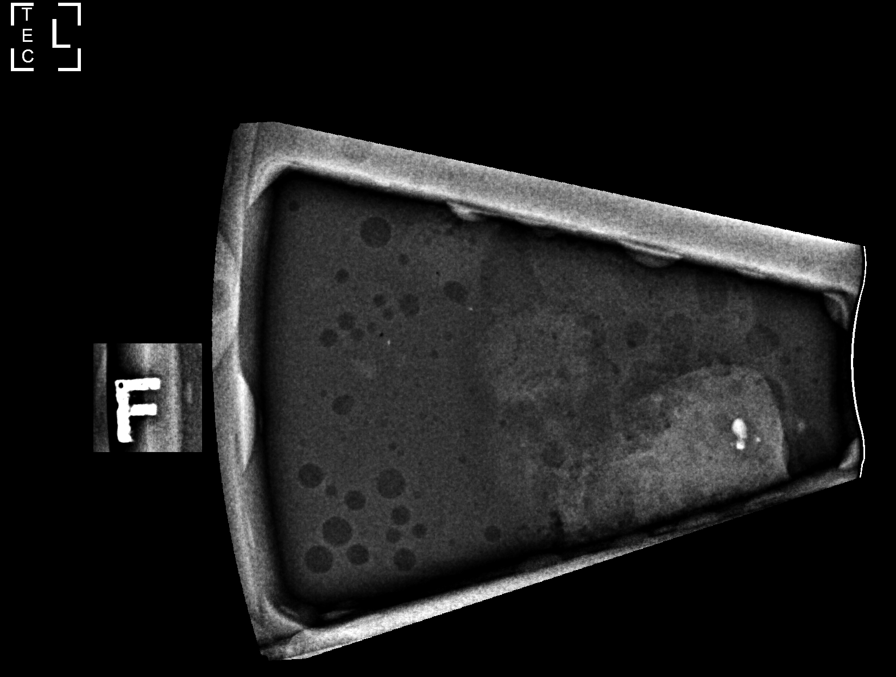

[L (4 of 7)]
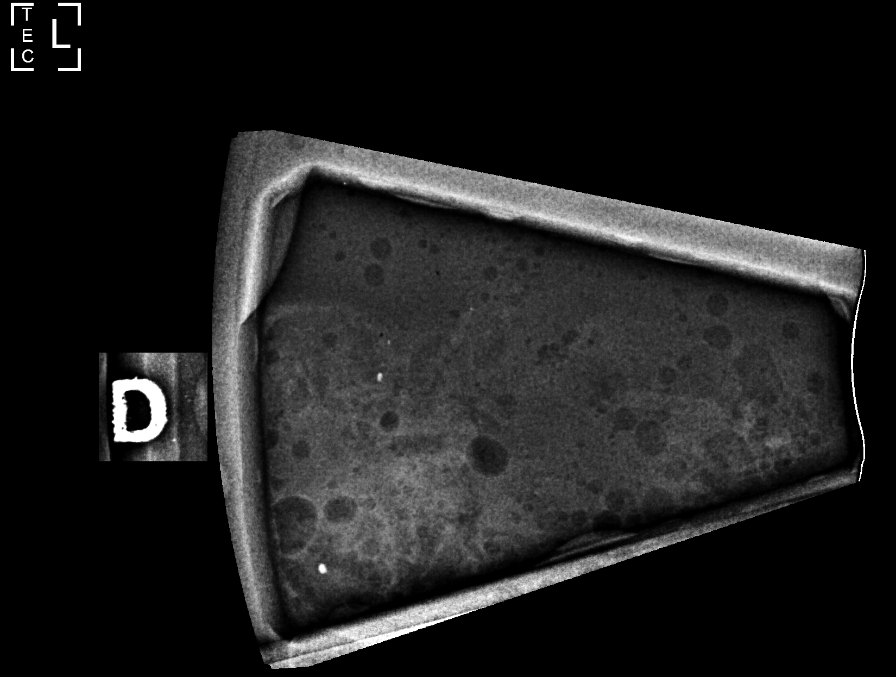

[L (5 of 7)]
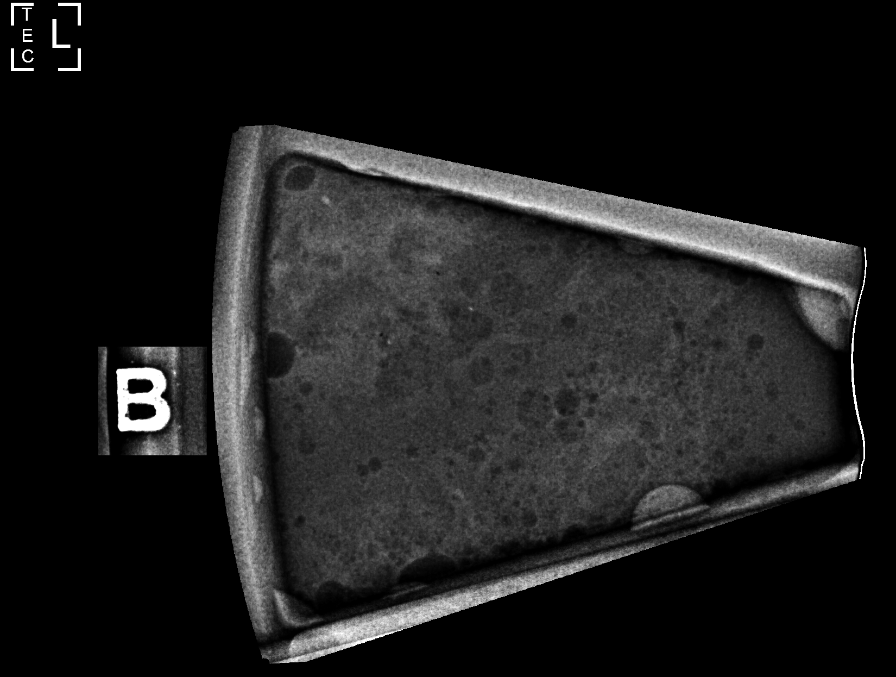

[L (6 of 7)]
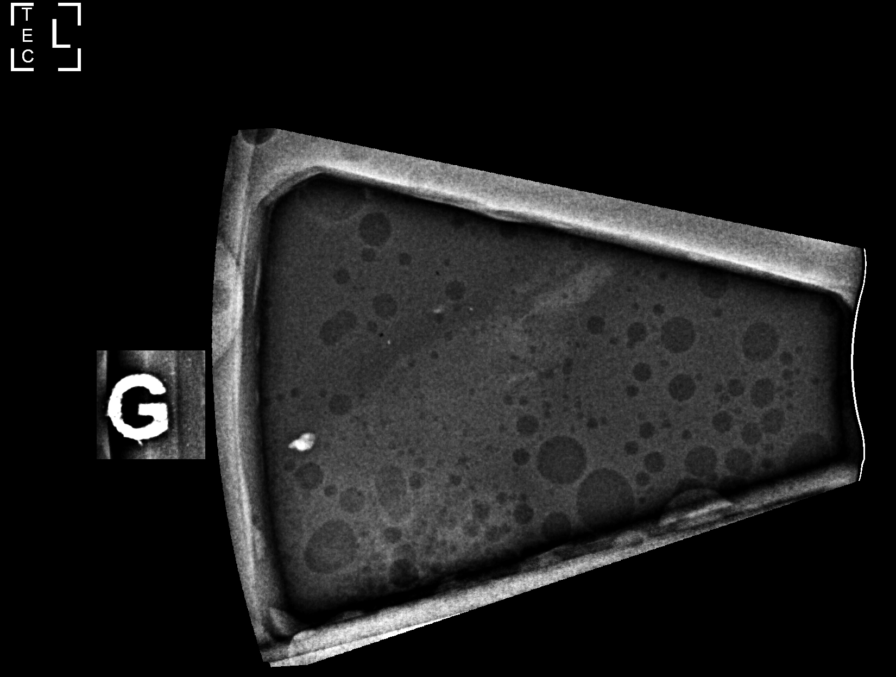

[L (7 of 7)]
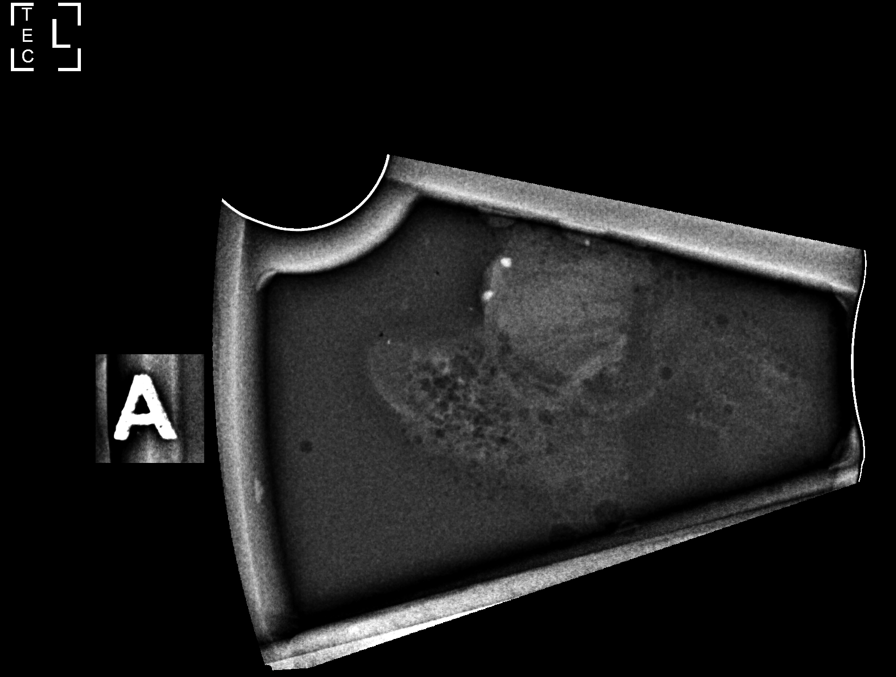

[7 of 11 positions shown; findings below may reference images not displayed]



Lesion quadrant: Upper outer quadrant

Using sterile technique and 1% Lidocaine as local anesthetic, under
stereotactic guidance, a 9 gauge vacuum assisted device was used to
perform core needle biopsy of calcifications in the posterior upper
outer quadrant of the left breast using a superior approach.
Specimen radiograph was performed showing calcifications within
multiple specimens. Specimens with calcifications are identified for
pathology. At the conclusion of the procedure, a coil shaped tissue
marker clip was deployed into the biopsy cavity.

Lesion quadrant: Upper outer quadrant

Using sterile technique and 1% Lidocaine as local anesthetic, under
stereotactic guidance, a 9 gauge vacuum assisted device was used to
perform core needle biopsy of calcifications in the anterior
upper-outer quadrant of the left breast using a superior approach.
Specimen radiograph was performed showing calcifications within
multiple specimens. Specimens with calcifications are identified for
pathology. At the conclusion of the procedure, an X shaped tissue
marker clip was deployed into the biopsy cavity.

Follow-up 2-view mammogram was performed and dictated separately.
IMPRESSION: Stereotactic-guided biopsy of the left breast x2. No apparent
complications.

ADDENDUM:
Pathology revealed HIGH GRADE DUCTAL CARCINOMA IN SITU WITH NECROSIS
AND CALCIFICATIONS of the Left breast, upper outer quadrant,
posterior, (coil clip). This was found to be concordant by Dr.
Gjensi Marcelis.

Pathology revealed HIGH GRADE DUCTAL CARCINOMA IN SITU WITH NECROSIS
AND CALCIFICATIONS of the Left breast, upper outer central, middle,
(x clip). This was found to be concordant by Dr. Gjensi Marcelis.

Pathology results were discussed with the patient's Jaeger, Groeg by telephone, per request. Ms. Kokusu reported
her mother did well after the biopsies with tenderness at the sites.
Post biopsy instructions and care were reviewed and questions were
answered. The patient's daughter was encouraged to call The [REDACTED] for any additional concerns. My direct
phone number was provided.

Surgical consultation has been arranged with Dr. Leonardas Aleman at

Medical oncology referral was arranged with Dr. Neide Goh at [HOSPITAL] [facility]HOSPITAL] on April 06, 2021.

The suspicious calcifications span approximately 10 cm in the AP
dimension. Additionally, please note the calcifications extend 4 cm
anterior and superior to the X shaped clip.

Pathology results reported by Stbn Lamilla, RN on 04/03/2021.



Lesion quadrant: Upper outer quadrant

Using sterile technique and 1% Lidocaine as local anesthetic, under
stereotactic guidance, a 9 gauge vacuum assisted device was used to
perform core needle biopsy of calcifications in the posterior upper
outer quadrant of the left breast using a superior approach.
Specimen radiograph was performed showing calcifications within
multiple specimens. Specimens with calcifications are identified for
pathology. At the conclusion of the procedure, a coil shaped tissue
marker clip was deployed into the biopsy cavity.

Lesion quadrant: Upper outer quadrant

Using sterile technique and 1% Lidocaine as local anesthetic, under
stereotactic guidance, a 9 gauge vacuum assisted device was used to
perform core needle biopsy of calcifications in the anterior
upper-outer quadrant of the left breast using a superior approach.
Specimen radiograph was performed showing calcifications within
multiple specimens. Specimens with calcifications are identified for
pathology. At the conclusion of the procedure, an X shaped tissue
marker clip was deployed into the biopsy cavity.

Follow-up 2-view mammogram was performed and dictated separately.
IMPRESSION: Stereotactic-guided biopsy of the left breast x2. No apparent
complications.

## 2021-07-06 ENCOUNTER — Ambulatory Visit: Payer: Medicare PPO | Admitting: Oncology

## 2021-07-09 NOTE — Progress Notes (Signed)
Amanda Green  [phone] [phone]     ID: Amanda Green DOB: December 04, 1935  MR#: [id number]  SVX#:[id number]  Patient Care Team: Alroy Dust, Carlean Jews.[name], MD as PCP - General (Family Medicine) Croitoru, Dani Gobble, MD as PCP - Cardiology (Cardiology) Johnathan Hausen, MD as Consulting Physician (General Surgery) Angely Dietz, Virgie Dad, MD as Consulting Physician (Oncology) Gery Pray, MD as Consulting Physician (Radiation Oncology) Aurea Graff OTHER MD:  CHIEF COMPLAINT: Estrogen receptor positive ductal carcinoma in situ (s/p bilateral mastectomy)  CURRENT TREATMENT: observation   INTERVAL HISTORY: Amanda Green was scheduled today for follow up of her noninvasive breast cancer however she did not show.   Since her last visit, she proceeded with left mastectomy on 05/10/2021 under Dr. Hassell Done. Pathology from the procedure ([id number]) revealed: microscopic focus of invasive ductal carcinoma, 0.1 cm; extensive high-grade ductal carcinoma in situ with necrosis and calcifications; Paget's disease of nipple; margins not involved. Prognostic panel was repeated due to the new invasive disease-- estrogen receptor 0% negative, progesterone receptor 0% negative, proliferation marker Ki-67 of 15%, Her2 positive by immunohistochemistry (3+).   REVIEW OF SYSTEMS: Amanda Green    COVID 19 VACCINATION STATUS: Status post injection times 2+ booster   HISTORY OF CURRENT ILLNESS: From the original intake note:  Amanda Green had routine left screening mammography on 02/24/2019 showing a possible abnormality. Of note, she has a history of right mastectomy in 1999 under Dr. Hassell Done.   She underwent left diagnostic mammography at [facility] on 03/04/2019 showing: breast density category B; indeterminate calcifications in the upper-outer left breast measuring 2.9 cm.  Accordingly on 03/04/2019 she proceeded to biopsy of the left breast area in question. The pathology from  this procedure ([id number]) showed: ductal carcinoma in situ with calcifications, grade 3. Prognostic indicators significant for: estrogen receptor, 90% positive with strong staining intensity and progesterone receptor, 0% negative.  She was started on tamoxifen shortly after by Dr. Hassell Done due to surgery delay for pandemic concerns.  She is scheduled for definitive left breast surgery 07/17/2019  The patient's subsequent history is as detailed below.   PAST MEDICAL HISTORY: Past Medical History:  Diagnosis Date   Arthritis    knees,   Breast cancer ([place]) 1999   right mastectomy   Breast cancer, left ([place]) 05/2019   left lumpectomy   Diabetes mellitus    Dislocation of left shoulder joint    Family history of prostate cancer    Hyperlipidemia    Hypertension    Memory loss    Numbness and tingling of right lower extremity    Paresthesia    Stroke ([place])    light stroke - 2012 ,right leg nerve damage     PAST SURGICAL HISTORY: Past Surgical History:  Procedure Laterality Date   ABDOMINAL HYSTERECTOMY     APPENDECTOMY     BREAST BIOPSY Left 01/19/2010   BREAST LUMPECTOMY WITH RADIOACTIVE SEED LOCALIZATION Left 07/17/2019   Procedure: LEFT BREAST LUMPECTOMY WITH RADIOACTIVE SEED LOCALIZATION;  Surgeon: Johnathan Hausen, MD;  Location: [place];  Service: General;  Laterality: Left;   BREAST SURGERY     Right mastectomy [place] Right 09/30/2014   Procedure: RIGHT CARPAL TUNNEL RELEASE;  Surgeon: Daryll Brod, MD;  Location: [place];  Service: Orthopedics;  Laterality: Right;   COLONOSCOPY     EYE SURGERY     cataracts   JOINT REPLACEMENT     lt total knee 2011   LIPOMA  EXCISION  11/28/2011   Procedure: EXCISION LIPOMA;  Surgeon: Pedro Earls, MD;  Location: [place];  Service: General;  Laterality: N/A;  excisino lipoma 4 cm back of neck   MASTECTOMY Right 1999   TOTAL MASTECTOMY Left 05/10/2021    Procedure: LEFT TOTAL MASTECTOMY;  Surgeon: Johnathan Hausen, MD;  Location: [facility];  Service: General;  Laterality: Left;    FAMILY HISTORY: Family History  Problem Relation Age of Onset   Esophageal cancer Mother        smoker   Other Father        Unsure of medical history.   Prostate cancer Brother        d. 86s   Cancer Son        ?? unsure, but may have had cancer   Cancer Son        ?? unsure, but may have had cancer  Patient's father died from lung cancer. Patient's mother died from esophageal cancer. The patient denies a family hx of breast or ovarian cancer. She has 1 brother. He was diagnosed with prostate cancer.   GYNECOLOGIC HISTORY:  No LMP recorded. Patient has had a hysterectomy. Menarche: 86 years old Age at first live birth: 86 years old [place] P 5 LMP age 85 Hysterectomy? Yes, age 59, uterus only BSO? No Hormone replacement?   SOCIAL HISTORY: (updated 06/10/2019)  Amanda Green is originally from [place].  She is currently retired. She is widowed. She lives at home with 1 of her granddaughteris, who is 43 and works at the [employer].  Another of her granddaughters Amanda Green works here in our center.    ADVANCED DIRECTIVES: not in place; she plans to name her daughter Amanda Green) as her healthcare power of attorney.  At the 09/14/2019 visit she was given the appropriate forms to complete and notarize at her discretion   HEALTH MAINTENANCE: Social History   Tobacco Use   Smoking status: Never   Smokeless tobacco: Never  Vaping Use   Vaping Use: Never used  Substance Use Topics   Alcohol use: No   Drug use: No     Colonoscopy: 03/2011, Dr. Penelope Coop, benign polyp  PAP: not on file, remote abdominal hysterectomy  Bone density: 06/2019, -1.8   Allergies  Allergen Reactions   Gabapentin     Drowsiness    Current Outpatient Medications  Medication Sig Dispense Refill   atorvastatin (LIPITOR) 80 MG tablet Take 80 mg by mouth daily.     brimonidine-timolol  (COMBIGAN) 0.2-0.5 % ophthalmic solution Place 1 drop into both eyes every 12 (twelve) hours.     calcium carbonate (OSCAL) 1500 (600 Ca) MG TABS tablet Take 1,500 mg by mouth 2 (two) times daily with a meal.     diltiazem (CARDIZEM CD) 240 MG 24 hr capsule Take 1 capsule (240 mg total) by mouth daily. 90 capsule 3   dorzolamide (TRUSOPT) 2 % ophthalmic solution Place 1 drop into both eyes daily.     hydrochlorothiazide (MICROZIDE) 12.5 MG capsule Take 12.5 mg by mouth daily.     HYDROcodone-acetaminophen (NORCO/VICODIN) 5-325 MG tablet Take 1 tablet by mouth every 6 (six) hours as needed for moderate pain. 15 tablet 0   latanoprost (XALATAN) 0.005 % ophthalmic solution Place 1 drop into both eyes at bedtime.     lisinopril (ZESTRIL) 20 MG tablet Take 20 mg by mouth daily.     meloxicam (MOBIC) 15 MG tablet Take 1 tablet (15 mg total) by mouth  daily as needed for pain (To be taken only if needed.).     metFORMIN (GLUCOPHAGE) 500 MG tablet Take 1,000 mg by mouth daily.     pregabalin (LYRICA) 50 MG capsule Take 1 capsule (50 mg total) by mouth 3 (three) times daily. 90 capsule 5   vitamin B-12 (CYANOCOBALAMIN) 1000 MCG tablet Take 1,000 mcg by mouth daily.     vitamin C (ASCORBIC ACID) 500 MG tablet Take 500 mg by mouth daily.     No current facility-administered medications for this visit.    OBJECTIVE:   There were no vitals filed for this visit.  Wt Readings from Last 3 Encounters:  05/10/21 141 lb (64 kg)  05/03/21 141 lb (64 kg)  04/29/21 140 lb (63.5 kg)   There is no height or weight on file to calculate BMI.   ECOG FS:   LAB RESULTS:  CMP     Component Value Date/Time   NA 139 05/11/2021 0419   NA 139 02/07/2021 1050   K 4.0 05/11/2021 0419   CL 107 05/11/2021 0419   CO2 25 05/11/2021 0419   GLUCOSE 204 (H) 05/11/2021 0419   BUN 17 05/11/2021 0419   BUN 17 02/07/2021 1050   CREATININE 0.77 05/11/2021 0419   CALCIUM 9.0 05/11/2021 0419   PROT 5.5 (L) 04/15/2021 0346    PROT 6.5 02/07/2021 1050   ALBUMIN 2.7 (L) 04/15/2021 0346   ALBUMIN 3.9 02/07/2021 1050   AST 15 04/15/2021 0346   ALT 11 04/15/2021 0346   ALKPHOS 39 04/15/2021 0346   BILITOT 0.7 04/15/2021 0346   BILITOT 0.4 02/07/2021 1050   GFRNONAA >60 05/11/2021 0419   GFRAA 72 02/07/2021 1050    No results found for: TOTALPROTELP, ALBUMINELP, A1GS, A2GS, BETS, BETA2SER, GAMS, MSPIKE, SPEI  No results found for: KPAFRELGTCHN, LAMBDASER, KAPLAMBRATIO  Lab Results  Component Value Date   WBC 12.0 (H) 05/11/2021   NEUTROABS 4.4 02/07/2021   HGB 9.9 (L) 05/11/2021   HCT 29.9 (L) 05/11/2021   MCV 89.8 05/11/2021   PLT 223 05/11/2021   No results found for: LABCA2  No components found for: MPNTIR443  No results for input(s): INR in the last 168 hours.  No results found for: LABCA2  No results found for: XVQ008  No results found for: QPY195  No results found for: KDT267  No results found for: CA2729  No components found for: HGQUANT  No results found for: CEA1 / No results found for: CEA1   No results found for: AFPTUMOR  No results found for: CHROMOGRNA  No results found for: HGBA, HGBA2QUANT, HGBFQUANT, HGBSQUAN (Hemoglobinopathy evaluation)   No results found for: LDH  No results found for: IRON, TIBC, IRONPCTSAT (Iron and TIBC)  No results found for: FERRITIN  Urinalysis    Component Value Date/Time   COLORURINE YELLOW 04/29/2021 1350   APPEARANCEUR HAZY (A) 04/29/2021 1350   LABSPEC 1.006 04/29/2021 1350   PHURINE 5.0 04/29/2021 1350   GLUCOSEU NEGATIVE 04/29/2021 1350   HGBUR NEGATIVE 04/29/2021 1350   BILIRUBINUR NEGATIVE 04/29/2021 1350   KETONESUR NEGATIVE 04/29/2021 1350   PROTEINUR NEGATIVE 04/29/2021 1350   UROBILINOGEN 0.2 03/23/2010 1156   NITRITE NEGATIVE 04/29/2021 1350   LEUKOCYTESUR MODERATE (A) 04/29/2021 1350    STUDIES: No results found.   ELIGIBLE FOR AVAILABLE RESEARCH PROTOCOL: no  ASSESSMENT: 86 y.o. [place] woman  (1)  status post right mastectomy 1999 -- no further details available  (2) status post left breast biopsy 03/04/2019 for  a clinical 2.9 cm noninvasive breast cancer (ductal carcinoma in situ), grade 3, estrogen receptor positive, progesterone receptor negative  (3) tamoxifen started March 2020, discontinued April 2022 with disease progression  (a) bone density 06/23/2019 shows a T score of -1.8   (4) left lumpectomy 07/17/2019 showed ductal carcinoma in situ measuring 2.7 cm, with close but negative margins  (5) genetics testing 09/25/2019 through the Common Hereditary Gene Panel offered by [facility] found no deleterious mutations in APC, ATM, AXIN2, BARD1, BMPR1A, BRCA1, BRCA2, BRIP1, CDH1, CDK4, CDKN2A (p14ARF), CDKN2A (p16INK4a), CHEK2, CTNNA1, DICER1, EPCAM (Deletion/duplication testing only), GREM1 (promoter region deletion/duplication testing only), KIT, MEN1, MLH1, MSH2, MSH3, MSH6, MUTYH, NBN, NF1, NHTL1, PALB2, PDGFRA, PMS2, POLD1, POLE, PTEN, RAD50, RAD51C, RAD51D, RNF43, SDHB, SDHC, SDHD, SMAD4, SMARCA4. STK11, TP53, TSC1, TSC2, and VHL.  The following genes were evaluated for sequence changes only: SDHA and HOXB13 c.251G>A variant only. The report date is 09/25/2019.  (a)  EPCAM VUS noted  (6) left breast biopsy x2 on 03/30/2021 both showed ductal carcinoma in situ, high-grade, estrogen and progesterone receptor positive  (7) left mastectomy 05/10/2021 showed a pT1 (mic) focus of invasive ductal carcinoma which was HER2 amplified, but estrogen and progesterone receptor negative, with an MIB-1 of 15%  (a) extensive DCIS, grade 3 was noted in the sample, with ample margins   PLAN: Amanda Green did not show for her 07/10/2021 visit.  Given the results of her mastectomy, she was going to be pretty much released after today in any case.  I have discussed the situation with her granddaughter, who works here, and we are simply going to change this to a virtual visit at some point in the next few weeks.  I  want to make sure that Amanda Green has all her questions answered and that she does not have any residual issues relating to her surgical treatment.  From that point of course she can be followed through her primary care physician.  Virgie Dad. Seif Teichert, MD 07/09/21 7:59 PM Medical Oncology and Hematology [facility] [place], [place] [address] Tel. [phone]    Fax. [phone]   I, Wilburn Mylar, am acting as scribe for Dr. Virgie Dad. Jamarrion Budai.  I, Lurline Del MD, have reviewed the above documentation for accuracy and completeness, and I agree with the above.   *Total Encounter Time as defined by the Centers for Medicare and Medicaid Services includes, in addition to the face-to-face time of a patient visit (documented in the note above) non-face-to-face time: obtaining and reviewing outside history, ordering and reviewing medications, tests or procedures, care coordination (communications with other health care professionals or caregivers) and documentation in the medical record.

## 2021-07-10 ENCOUNTER — Inpatient Hospital Stay: Attending: Oncology | Admitting: Oncology

## 2021-07-10 DIAGNOSIS — D0512 Intraductal carcinoma in situ of left breast: Secondary | ICD-10-CM

## 2021-07-11 ENCOUNTER — Telehealth: Payer: Self-pay | Admitting: Oncology

## 2021-07-11 NOTE — Telephone Encounter (Signed)
Scheduled appointment per 07/25 los. Patient will receive calender.

## 2021-08-08 ENCOUNTER — Ambulatory Visit: Payer: Medicare PPO | Admitting: Neurology

## 2021-08-15 DIAGNOSIS — R319 Hematuria, unspecified: Secondary | ICD-10-CM | POA: Diagnosis not present

## 2021-08-15 DIAGNOSIS — M25551 Pain in right hip: Secondary | ICD-10-CM | POA: Diagnosis not present

## 2021-08-25 ENCOUNTER — Ambulatory Visit: Payer: Medicare PPO | Admitting: Cardiovascular Disease

## 2021-08-30 ENCOUNTER — Other Ambulatory Visit: Payer: Self-pay | Admitting: Neurology

## 2021-08-30 NOTE — Telephone Encounter (Signed)
Pt is due for a refill on her lyrica. Pt has an upcoming appt. [place] Controlled Substance Registry checked and is appropriate.

## 2021-08-31 ENCOUNTER — Inpatient Hospital Stay: Payer: Medicare PPO | Attending: Oncology | Admitting: Adult Health

## 2021-08-31 ENCOUNTER — Other Ambulatory Visit: Payer: Self-pay

## 2021-08-31 VITALS — BP 157/86 | HR 83 | Temp 97.2°F | Resp 18 | Ht 60.0 in | Wt 146.1 lb

## 2021-08-31 DIAGNOSIS — Z9013 Acquired absence of bilateral breasts and nipples: Secondary | ICD-10-CM | POA: Diagnosis not present

## 2021-08-31 DIAGNOSIS — Z853 Personal history of malignant neoplasm of breast: Secondary | ICD-10-CM | POA: Diagnosis not present

## 2021-08-31 DIAGNOSIS — Z86000 Personal history of in-situ neoplasm of breast: Secondary | ICD-10-CM | POA: Insufficient documentation

## 2021-08-31 DIAGNOSIS — D0512 Intraductal carcinoma in situ of left breast: Secondary | ICD-10-CM | POA: Diagnosis not present

## 2021-08-31 NOTE — Progress Notes (Addendum)
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     Since her last visit, she proceeded with left mastectomy on 05/10/2021 under Dr. Hassell Done. Pathology from the procedure ([id number]) revealed: microscopic focus of invasive ductal carcinoma, 0.1 cm; extensive high-grade ductal carcinoma in situ with necrosis and calcifications; Paget's disease of nipple; margins not involved. Prognostic panel was repeated due to the new invasive disease-- estrogen receptor 0% negative, progesterone receptor 0% negative, proliferation marker Ki-67 of 15%, Her2 positive by immunohistochemistry (3+).   REVIEW OF SYSTEMS: Review of Systems  Constitutional:  Negative for appetite change, chills, fatigue, fever and unexpected weight change.  HENT:   Negative for hearing loss, lump/mass and trouble swallowing.   Eyes:  Negative for eye problems and icterus.  Respiratory:  Negative for chest tightness, cough and shortness of breath.   Cardiovascular:  Negative for chest pain, leg swelling and palpitations.  Gastrointestinal:  Negative for abdominal distention, abdominal pain, constipation, diarrhea, nausea and vomiting.  Endocrine: Negative for hot flashes.  Genitourinary:  Negative for difficulty urinating.   Musculoskeletal:   Negative for arthralgias.  Skin:  Negative for itching and rash.  Neurological:  Negative for dizziness, extremity weakness, headaches and numbness.  Hematological:  Negative for adenopathy. Does not bruise/bleed easily.  Psychiatric/Behavioral:  Negative for depression. The patient is not nervous/anxious.     COVID 19 VACCINATION STATUS: Status post injection times 2+ booster   HISTORY OF CURRENT ILLNESS: From the original intake note:  Amanda Green had routine left screening mammography on 02/24/2019 showing a possible abnormality. Of note, she has a history of right mastectomy in 1999 under Dr. Hassell Done.   She underwent left diagnostic mammography at [facility] on 03/04/2019 showing: breast density category B; indeterminate calcifications in the upper-outer left breast measuring 2.9 cm.  Accordingly on 03/04/2019 she proceeded to biopsy of the left breast area in question. The pathology from this procedure ([id number]) showed: ductal carcinoma in situ with calcifications, grade 3. Prognostic indicators significant for: estrogen receptor, 90% positive with strong staining intensity and progesterone receptor, 0% negative.  She was started on tamoxifen shortly after by Dr. Hassell Done due to surgery delay for pandemic concerns.  She is scheduled for definitive left breast surgery 07/17/2019  The patient's subsequent history is as detailed below.   PAST MEDICAL HISTORY: Past Medical History:  Diagnosis Date   Arthritis    knees,   Breast cancer ([place]) 1999   right mastectomy   Breast cancer, left ([place]) 05/2019   left lumpectomy   Diabetes mellitus    Dislocation of left shoulder joint    Family history of prostate cancer    Hyperlipidemia    Hypertension    Memory loss    Numbness and tingling of right lower extremity    Paresthesia    Stroke ([place])    light stroke - 2012 ,right leg nerve damage  PAST SURGICAL HISTORY: Past Surgical History:  Procedure Laterality  Date   ABDOMINAL HYSTERECTOMY     APPENDECTOMY     BREAST BIOPSY Left 01/19/2010   BREAST LUMPECTOMY WITH RADIOACTIVE SEED LOCALIZATION Left 07/17/2019   Procedure: LEFT BREAST LUMPECTOMY WITH RADIOACTIVE SEED LOCALIZATION;  Surgeon: Johnathan Hausen, MD;  Location: [facility];  Service: General;  Laterality: Left;   BREAST SURGERY     Right mastectomy [place] Right 09/30/2014   Procedure: RIGHT CARPAL TUNNEL RELEASE;  Surgeon: Daryll Brod, MD;  Location: [facility];  Service: Orthopedics;  Laterality: Right;   COLONOSCOPY     EYE SURGERY     cataracts   JOINT REPLACEMENT     lt total knee 2011   LIPOMA EXCISION  11/28/2011   Procedure: EXCISION LIPOMA;  Surgeon: Pedro Earls, MD;  Location: [facility];  Service: General;  Laterality: N/A;  excisino lipoma 4 cm back of neck   MASTECTOMY Right 1999   TOTAL MASTECTOMY Left 05/10/2021   Procedure: LEFT TOTAL MASTECTOMY;  Surgeon: Johnathan Hausen, MD;  Location: [facility];  Service: General;  Laterality: Left;    FAMILY HISTORY: Family History  Problem Relation Age of Onset   Esophageal cancer Mother        smoker   Other Father        Unsure of medical history.   Prostate cancer Brother        d. 44s   Cancer Son        ?? unsure, but may have had cancer   Cancer Son        ?? unsure, but may have had cancer  Patient's father died from lung cancer. Patient's mother died from esophageal cancer. The patient denies a family hx of breast or ovarian cancer. She has 1 brother. He was diagnosed with prostate cancer.   GYNECOLOGIC HISTORY:  No LMP recorded. Patient has had a hysterectomy. Menarche: 86 years old Age at first live birth: 86 years old [place] P 5 LMP age 58 Hysterectomy? Yes, age 59, uterus only BSO? No Hormone replacement?   SOCIAL HISTORY: (updated 06/10/2019)  Amanda Green is originally from [place].  She is currently retired. She is widowed. She lives at  home with 1 of her granddaughteris, who is 12 and works at the [employer].  Another of her granddaughters Amanda Green works here in our center.    ADVANCED DIRECTIVES: not in place; she plans to name her daughter Amanda Green) as her healthcare power of attorney.  At the 09/14/2019 visit she was given the appropriate forms to complete and notarize at her discretion   HEALTH MAINTENANCE: Social History   Tobacco Use   Smoking status: Never   Smokeless tobacco: Never  Vaping Use   Vaping Use: Never used  Substance Use Topics   Alcohol use: No   Drug use: No     Colonoscopy: 03/2011, Dr. Penelope Coop, benign polyp  PAP: not on file, remote abdominal hysterectomy  Bone density: 06/2019, -1.8   Allergies  Allergen Reactions   Gabapentin     Drowsiness    Current Outpatient Medications  Medication Sig Dispense Refill   apixaban (ELIQUIS) 5 MG TABS tablet Take by mouth.     atorvastatin (LIPITOR) 80 MG tablet Take 80 mg by mouth daily.     brimonidine-timolol (COMBIGAN) 0.2-0.5 % ophthalmic solution Place 1 drop into both eyes every 12 (twelve) hours.     calcium  carbonate (OSCAL) 1500 (600 Ca) MG TABS tablet Take 1,500 mg by mouth 2 (two) times daily with a meal.     diltiazem (CARDIZEM CD) 240 MG 24 hr capsule Take 1 capsule (240 mg total) by mouth daily. 90 capsule 3   dorzolamide (TRUSOPT) 2 % ophthalmic solution Place 1 drop into both eyes daily.     hydrochlorothiazide (MICROZIDE) 12.5 MG capsule Take 12.5 mg by mouth daily.     latanoprost (XALATAN) 0.005 % ophthalmic solution Place 1 drop into both eyes at bedtime.     lisinopril (ZESTRIL) 20 MG tablet Take 20 mg by mouth daily.     metFORMIN (GLUCOPHAGE) 500 MG tablet Take 1,000 mg by mouth daily.     pregabalin (LYRICA) 50 MG capsule TAKE 1 CAPSULE BY MOUTH THREE TIMES DAILY 90 capsule 0   vitamin B-12 (CYANOCOBALAMIN) 1000 MCG tablet Take 1,000 mcg by mouth daily.     vitamin C (ASCORBIC ACID) 500 MG tablet Take 500 mg by mouth daily.      HYDROcodone-acetaminophen (NORCO/VICODIN) 5-325 MG tablet Take 1 tablet by mouth every 6 (six) hours as needed for moderate pain. (Patient not taking: Reported on 08/31/2021) 15 tablet 0   meloxicam (MOBIC) 15 MG tablet Take 1 tablet (15 mg total) by mouth daily as needed for pain (To be taken only if needed.). (Patient not taking: Reported on 08/31/2021)     No current facility-administered medications for this visit.    OBJECTIVE:   Vitals:   08/31/21 1118  BP: (!) 157/86  Pulse: 83  Resp: 18  Temp: (!) 97.2 F (36.2 C)  SpO2: 99%   Wt Readings from Last 3 Encounters:  08/31/21 146 lb 1.6 oz (66.3 kg)  05/10/21 141 lb (64 kg)  05/03/21 141 lb (64 kg)   Body mass index is 28.53 kg/m.   ECOG FS: GENERAL: Patient is a well appearing female in no acute distress HEENT:  Sclerae anicteric.  Oropharynx clear and moist. No ulcerations or evidence of oropharyngeal candidiasis. Neck is supple.  NODES:  No cervical, supraclavicular, or axillary lymphadenopathy palpated.  BREAST EXAM:  s/p bilateral mastectomies, no sign of local recurrence, healing well LUNGS:  Clear to auscultation bilaterally.  No wheezes or rhonchi. HEART:  Regular rate and rhythm. No murmur appreciated. ABDOMEN:  Soft, nontender.  Positive, normoactive bowel sounds. No organomegaly palpated. MSK:  No focal spinal tenderness to palpation. Full range of motion bilaterally in the upper extremities. EXTREMITIES:  No peripheral edema.   SKIN:  Clear with no obvious rashes or skin changes. No nail dyscrasia. NEURO:  Nonfocal. Well oriented.  Appropriate affect.   LAB RESULTS:  CMP     Component Value Date/Time   NA 139 05/11/2021 0419   NA 139 02/07/2021 1050   K 4.0 05/11/2021 0419   CL 107 05/11/2021 0419   CO2 25 05/11/2021 0419   GLUCOSE 204 (H) 05/11/2021 0419   BUN 17 05/11/2021 0419   BUN 17 02/07/2021 1050   CREATININE 0.77 05/11/2021 0419   CALCIUM 9.0 05/11/2021 0419   PROT 5.5 (L) 04/15/2021 0346    PROT 6.5 02/07/2021 1050   ALBUMIN 2.7 (L) 04/15/2021 0346   ALBUMIN 3.9 02/07/2021 1050   AST 15 04/15/2021 0346   ALT 11 04/15/2021 0346   ALKPHOS 39 04/15/2021 0346   BILITOT 0.7 04/15/2021 0346   BILITOT 0.4 02/07/2021 1050   GFRNONAA >60 05/11/2021 0419   GFRAA 72 02/07/2021 1050    No results found  for: TOTALPROTELP, ALBUMINELP, A1GS, A2GS, BETS, BETA2SER, GAMS, MSPIKE, SPEI  No results found for: KPAFRELGTCHN, LAMBDASER, KAPLAMBRATIO  Lab Results  Component Value Date   WBC 12.0 (H) 05/11/2021   NEUTROABS 4.4 02/07/2021   HGB 9.9 (L) 05/11/2021   HCT 29.9 (L) 05/11/2021   MCV 89.8 05/11/2021   PLT 223 05/11/2021   No results found for: LABCA2  No components found for: [id number]  No results for input(s): INR in the last 168 hours.  No results found for: LABCA2  No results found for: GMW102  No results found for: VOZ366  No results found for: YQI347  No results found for: CA2729  No components found for: HGQUANT  No results found for: CEA1 / No results found for: CEA1   No results found for: AFPTUMOR  No results found for: CHROMOGRNA  No results found for: HGBA, HGBA2QUANT, HGBFQUANT, HGBSQUAN (Hemoglobinopathy evaluation)   No results found for: LDH  No results found for: IRON, TIBC, IRONPCTSAT (Iron and TIBC)  No results found for: FERRITIN  Urinalysis    Component Value Date/Time   COLORURINE YELLOW 04/29/2021 1350   APPEARANCEUR HAZY (A) 04/29/2021 1350   LABSPEC 1.006 04/29/2021 1350   PHURINE 5.0 04/29/2021 1350   GLUCOSEU NEGATIVE 04/29/2021 1350   HGBUR NEGATIVE 04/29/2021 1350   BILIRUBINUR NEGATIVE 04/29/2021 1350   KETONESUR NEGATIVE 04/29/2021 1350   PROTEINUR NEGATIVE 04/29/2021 1350   UROBILINOGEN 0.2 03/23/2010 1156   NITRITE NEGATIVE 04/29/2021 1350   LEUKOCYTESUR MODERATE (A) 04/29/2021 1350    STUDIES: No results found.   ELIGIBLE FOR AVAILABLE RESEARCH PROTOCOL: no  ASSESSMENT: 86 y.o. [place] woman  (1)  status post right mastectomy 1999 -- no further details available  (2) status post left breast biopsy 03/04/2019 for a clinical 2.9 cm noninvasive breast cancer (ductal carcinoma in situ), grade 3, estrogen receptor positive, progesterone receptor negative  (3) tamoxifen started March 2020, discontinued April 2022 with disease progression  (a) bone density 06/23/2019 shows a T score of -1.8   (4) left lumpectomy 07/17/2019 showed ductal carcinoma in situ measuring 2.7 cm, with close but negative margins  (5) genetics testing 09/25/2019 through the Common Hereditary Gene Panel offered by [facility] found no deleterious mutations in APC, ATM, AXIN2, BARD1, BMPR1A, BRCA1, BRCA2, BRIP1, CDH1, CDK4, CDKN2A (p14ARF), CDKN2A (p16INK4a), CHEK2, CTNNA1, DICER1, EPCAM (Deletion/duplication testing only), GREM1 (promoter region deletion/duplication testing only), KIT, MEN1, MLH1, MSH2, MSH3, MSH6, MUTYH, NBN, NF1, NHTL1, PALB2, PDGFRA, PMS2, POLD1, POLE, PTEN, RAD50, RAD51C, RAD51D, RNF43, SDHB, SDHC, SDHD, SMAD4, SMARCA4. STK11, TP53, TSC1, TSC2, and VHL.  The following genes were evaluated for sequence changes only: SDHA and HOXB13 c.251G>A variant only. The report date is 09/25/2019.  (a)  EPCAM VUS noted  (6) left breast biopsy x2 on 03/30/2021 both showed ductal carcinoma in situ, high-grade, estrogen and progesterone receptor positive  (7) left mastectomy 05/10/2021 showed a pT1 (mic) focus of invasive ductal carcinoma which was HER2 amplified, but estrogen and progesterone receptor negative, with an MIB-1 of 15%  (a) extensive DCIS, grade 3 was noted in the sample, with ample margins   PLAN: [name] met with myself and Dr. Jana Hakim to review her pathology results and next steps.  She has recovered from her surgery without difficulty.  Considering that she had mainly non invasive breast cancer with a microscopic invasion, she is considered cured of her breast cancer.  She was recommended to f/u with her PCP  annually for a breast exam.  WE are happy to  see her on an as needed basis in the future should any concerns arise.    Wilber Bihari, NP 09/02/21 3:17 PM Medical Oncology and Hematology [facility] [place], [place] [address] Tel. [phone]    Fax. [phone]   ADDENDUM: I reviewed the pathology with [facility] in detail.  She understands she had extensive ductal carcinoma in situ.  This was removed through mastectomy with clear margins.  The cure rate for ductal carcinoma in situ treated with mastectomy approaches 100%.  There was a microscopic area of invasive disease.  This is treated essentially as ductal carcinoma in situ.  The possibility of this having metastasized or spread outside the breast is nonzero but certainly in the 1 and a percent range.  We do not do trastuzumab in these cases since the risk of complications and side effects is greater than the potential benefit and the tumor was estrogen and progesterone receptor negative so antiestrogens in any case are not indicated.  She is status post bilateral mastectomies and all she will need in terms of further follow-up is a yearly physician chest wall exam  Accordingly we are comfortable releasing her to her primary care physician.  I will be glad to see her again at any point in the future but as of now are making no further routine appointments for her here.  Total encounter time 20 minutes.*  I personally saw this patient and performed a substantive portion of this encounter with the listed APP documented above.   Chauncey Cruel, MD Medical Oncology and Hematology [facility] [address] [place], [place] [address] Tel. [phone]    Fax. [phone]     *Total Encounter Time as defined by the Centers for Medicare and Medicaid Services includes, in addition to the face-to-face time of a patient visit (documented in the note above) non-face-to-face time: obtaining and  reviewing outside history, ordering and reviewing medications, tests or procedures, care coordination (communications with other health care professionals or caregivers) and documentation in the medical record.

## 2021-09-01 DIAGNOSIS — I4891 Unspecified atrial fibrillation: Secondary | ICD-10-CM | POA: Diagnosis not present

## 2021-09-01 DIAGNOSIS — Z23 Encounter for immunization: Secondary | ICD-10-CM | POA: Diagnosis not present

## 2021-09-01 DIAGNOSIS — M792 Neuralgia and neuritis, unspecified: Secondary | ICD-10-CM | POA: Diagnosis not present

## 2021-09-01 DIAGNOSIS — E7800 Pure hypercholesterolemia, unspecified: Secondary | ICD-10-CM | POA: Diagnosis not present

## 2021-09-01 DIAGNOSIS — E1169 Type 2 diabetes mellitus with other specified complication: Secondary | ICD-10-CM | POA: Diagnosis not present

## 2021-09-01 DIAGNOSIS — R413 Other amnesia: Secondary | ICD-10-CM | POA: Diagnosis not present

## 2021-09-01 DIAGNOSIS — I10 Essential (primary) hypertension: Secondary | ICD-10-CM | POA: Diagnosis not present

## 2021-09-02 ENCOUNTER — Encounter: Payer: Self-pay | Admitting: Adult Health

## 2021-09-04 ENCOUNTER — Telehealth: Admitting: Oncology

## 2021-09-18 DIAGNOSIS — E119 Type 2 diabetes mellitus without complications: Secondary | ICD-10-CM | POA: Diagnosis not present

## 2021-10-03 DIAGNOSIS — H401113 Primary open-angle glaucoma, right eye, severe stage: Secondary | ICD-10-CM | POA: Diagnosis not present

## 2021-11-13 ENCOUNTER — Other Ambulatory Visit: Payer: Self-pay

## 2021-11-13 ENCOUNTER — Encounter: Payer: Self-pay | Admitting: Neurology

## 2021-11-13 ENCOUNTER — Ambulatory Visit: Payer: Medicare PPO | Admitting: Neurology

## 2021-11-13 VITALS — BP 162/84 | HR 88 | Ht 60.0 in | Wt 153.5 lb

## 2021-11-13 DIAGNOSIS — R269 Unspecified abnormalities of gait and mobility: Secondary | ICD-10-CM

## 2021-11-13 DIAGNOSIS — I639 Cerebral infarction, unspecified: Secondary | ICD-10-CM | POA: Diagnosis not present

## 2021-11-13 DIAGNOSIS — M545 Low back pain, unspecified: Secondary | ICD-10-CM | POA: Diagnosis not present
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
## 2021-11-15 ENCOUNTER — Ambulatory Visit (INDEPENDENT_AMBULATORY_CARE_PROVIDER_SITE_OTHER): Payer: Medicare PPO | Admitting: Cardiovascular Disease

## 2021-11-15 ENCOUNTER — Other Ambulatory Visit: Payer: Self-pay

## 2021-11-15 VITALS — BP 131/80 | HR 84 | Ht 60.0 in | Wt 151.0 lb

## 2021-11-15 DIAGNOSIS — I480 Paroxysmal atrial fibrillation: Secondary | ICD-10-CM | POA: Diagnosis not present

## 2021-11-15 DIAGNOSIS — Z7901 Long term (current) use of anticoagulants: Secondary | ICD-10-CM

## 2021-11-15 DIAGNOSIS — E119 Type 2 diabetes mellitus without complications: Secondary | ICD-10-CM | POA: Diagnosis not present

## 2021-11-15 DIAGNOSIS — I428 Other cardiomyopathies: Secondary | ICD-10-CM | POA: Diagnosis not present

## 2021-11-15 NOTE — Patient Instructions (Signed)
Medication Instructions:  No changes *If you need a refill on your cardiac medications before your next appointment, please call your pharmacy*   Lab Work: None ordered If you have labs (blood work) drawn today and your tests are completely normal, you will receive your results only by: [place] (if you have MyChart) OR A paper copy in the mail If you have any lab test that is abnormal or we need to change your treatment, we will call you to review the results.   Testing/Procedures: Myocardial Amyloid Planar and Spect Scan - Your physician has requested that you have a scan to evaluate for cardiac amyloidosis. This test "outlines" the heart to determine if you have any amyloid protein deposits located in your heart. This will be performed here at our [facility] location. The test will take approximately 2 hours to complete. Please do not wear any cologne or fragrances.   Follow-Up: At [facility], you and your health needs are our priority.  As part of our continuing mission to provide you with exceptional heart care, we have created designated Provider Care Teams.  These Care Teams include your primary Cardiologist (physician) and Advanced Practice Providers (APPs -  Physician Assistants and Nurse Practitioners) who all work together to provide you with the care you need, when you need it.  We recommend signing up for the patient portal called "MyChart".  Sign up information is provided on this After Visit Summary.  MyChart is used to connect with patients for Virtual Visits (Telemedicine).  Patients are able to view lab/test results, encounter notes, upcoming appointments, etc.  Non-urgent messages can be sent to your provider as well.   To learn more about what you can do with MyChart, go to [email or web address].    Your next appointment:   6 month(s)  The format for your next appointment:   In Person  Provider:   Sanda Klein, MD

## 2021-11-15 NOTE — Progress Notes (Signed)
Cardiology Office Note:    Date:  11/20/2021   ID:  [name], DOB 1935-09-04, MRN [id number]  PCP:  Aurea Graff.[name], MD   [facility] Providers Cardiologist:  Sanda Klein, MD     Referring MD: Alroy Dust, Carlean Jews.[name], MD   Chief Complaint  Patient presents with   Atrial Fibrillation    History of Present Illness:    Lamaya Hyneman is a 86 y.o. female with a hx of asymptomatic atrial fibrillation rapid ventricular response, hypertension, hyperlipidemia, type 2 diabetes mellitus, history of stroke, breast cancer status post right mastectomy 1999, status post left lumpectomy 2020 with recurrent disease and left mastectomy in May 2022.  She has over the 2 moderate cognitive difficulties.  She underwent an echocardiogram she was diagnosed with atrial fibrillation and this showed severe left ventricular hypertrophy, even though the electrocardiogram does not show hyper voltage.  She only had mild left atrial dilation and the mitral inflow was consistent with impaired relaxation, probably elevated filling pressures (E/e' 14-25).  She checked and her blood pressure was high at 160/72, but recheck several minutes later her blood pressure was 131/80.  In September her blood pressure was 126/72.  She does not talk much in the review of system was provided primarily by her daughter.  The patient has not had shortness of breath, angina, palpitations, dizziness or syncope.  She has not had any falls, injuries or serious bleeding.  Past Medical History:  Diagnosis Date   Arthritis    knees,   Breast cancer ([place]) 1999   right mastectomy   Breast cancer, left ([place]) 05/2019   left lumpectomy   Diabetes mellitus    Dislocation of left shoulder joint    Family history of prostate cancer    Hyperlipidemia    Hypertension    Memory loss    Numbness and tingling of right lower extremity    Paresthesia    Stroke ([place])    light stroke - 2012 ,right leg nerve damage     Past  Surgical History:  Procedure Laterality Date   ABDOMINAL HYSTERECTOMY     APPENDECTOMY     BREAST BIOPSY Left 01/19/2010   BREAST LUMPECTOMY WITH RADIOACTIVE SEED LOCALIZATION Left 07/17/2019   Procedure: LEFT BREAST LUMPECTOMY WITH RADIOACTIVE SEED LOCALIZATION;  Surgeon: Johnathan Hausen, MD;  Location: [facility];  Service: General;  Laterality: Left;   BREAST SURGERY     Right mastectomy [place] Right 09/30/2014   Procedure: RIGHT CARPAL TUNNEL RELEASE;  Surgeon: Daryll Brod, MD;  Location: [facility];  Service: Orthopedics;  Laterality: Right;   COLONOSCOPY     EYE SURGERY     cataracts   JOINT REPLACEMENT     lt total knee 2011   LIPOMA EXCISION  11/28/2011   Procedure: EXCISION LIPOMA;  Surgeon: Pedro Earls, MD;  Location: [facility];  Service: General;  Laterality: N/A;  excisino lipoma 4 cm back of neck   MASTECTOMY Right 1999   TOTAL MASTECTOMY Left 05/10/2021   Procedure: LEFT TOTAL MASTECTOMY;  Surgeon: Johnathan Hausen, MD;  [facility];  Service: General;  Laterality: Left;    Current Medications: Current Meds  Medication Sig   apixaban (ELIQUIS) 5 MG TABS tablet Take by mouth.   atorvastatin (LIPITOR) 80 MG tablet Take 80 mg by mouth daily.   brimonidine-timolol (COMBIGAN) 0.2-0.5 % ophthalmic solution Place 1 drop into both eyes every 12 (twelve) hours.  calcium carbonate (OSCAL) 1500 (600 Ca) MG TABS tablet Take 1,500 mg by mouth 2 (two) times daily with a meal.   diltiazem (CARDIZEM CD) 240 MG 24 hr capsule Take 1 capsule (240 mg total) by mouth daily.   dorzolamide (TRUSOPT) 2 % ophthalmic solution Place 1 drop into both eyes daily.   hydrochlorothiazide (MICROZIDE) 12.5 MG capsule Take 12.5 mg by mouth daily.   latanoprost (XALATAN) 0.005 % ophthalmic solution Place 1 drop into both eyes at bedtime.   lisinopril (ZESTRIL) 20 MG tablet Take 20 mg by mouth daily.   metFORMIN (GLUCOPHAGE) 500  MG tablet Take 1,000 mg by mouth daily.   pregabalin (LYRICA) 50 MG capsule TAKE 1 CAPSULE BY MOUTH THREE TIMES DAILY   vitamin B-12 (CYANOCOBALAMIN) 1000 MCG tablet Take 1,000 mcg by mouth daily.   vitamin C (ASCORBIC ACID) 500 MG tablet Take 500 mg by mouth daily.     Allergies:   Gabapentin   Social History   Socioeconomic History   Marital status: Widowed    Spouse name: Not on file   Number of children: 5   Years of education: HS - 12 years   Highest education level: Not on file  Occupational History   Occupation: Retired  Tobacco Use   Smoking status: Never   Smokeless tobacco: Never  Vaping Use   Vaping Use: Never used  Substance and Sexual Activity   Alcohol use: No   Drug use: No   Sexual activity: Not on file  Other Topics Concern   Not on file  Social History Narrative   Lives at home alone.   Right-handed.   1 cup caffeine daily.   Social Determinants of Health   Financial Resource Strain: Not on file  Food Insecurity: Not on file  Transportation Needs: Not on file  Physical Activity: Not on file  Stress: Not on file  Social Connections: Not on file     Family History: The patient's family history includes Cancer in her son and son; Esophageal cancer in her mother; Other in her father; Prostate cancer in her brother.  ROS:   Please see the history of present illness.     All other systems reviewed and are negative.  EKGs/Labs/Other Studies Reviewed:    The following studies were reviewed today: Echocardiogram April 2022  1. Consider infiltrative process such as amyloidosis due to severe LV  wall thickening. Left ventricular ejection fraction, by estimation, is  >75%. The left ventricle has hyperdynamic function. The left ventricle has  no regional wall motion  abnormalities. There is severe left ventricular hypertrophy. Left  ventricular diastolic parameters are consistent with Grade I diastolic  dysfunction (impaired relaxation). Elevated left  ventricular end-diastolic  pressure.   2. Right ventricular systolic function is normal. The right ventricular  size is normal. There is normal pulmonary artery systolic pressure. The  estimated right ventricular systolic pressure is 54.6 mmHg.   3. Left atrial size was mildly dilated.   4. The pericardial effusion is posterior to the left ventricle. Trivial  pericardial effusion.   5. The mitral valve is abnormal. Trivial mitral valve regurgitation.   6. The aortic valve is tricuspid. Aortic valve regurgitation is not  visualized. Mild aortic valve sclerosis is present, with no evidence of  aortic valve stenosis.   7. The inferior vena cava is normal in size with greater than 50%  respiratory variability, suggesting right atrial pressure of 3 mmHg.   Comparison(s): Unable to locate echo for comparison.  EKG:  EKG is  ordered today.  The ekg ordered today demonstrates normal sinus rhythm, QS pattern in leads V1-V2, generalized low normal QRS voltage no evidence of LVH, no repolarization changes, QTC 439 ms  Recent Labs: 04/14/2021: TSH 2.740 04/15/2021: ALT 11; Magnesium 1.9 05/11/2021: BUN 17; Creatinine, Ser 0.77; Hemoglobin 9.9; Platelets 223; Potassium 4.0; Sodium 139  Recent Lipid Panel No results found for: CHOL, TRIG, HDL, CHOLHDL, VLDL, LDLCALC, LDLDIRECT   Risk Assessment/Calculations:    CHA2DS2-VASc Score = 7   This indicates a 11.2% annual risk of stroke. The patient's score is based upon: CHF History: 0 HTN History: 1 Diabetes History: 1 Stroke History: 2 Vascular Disease History: 0 Age Score: 2 Gender Score: 1          Physical Exam:    VS:  BP 131/80   Pulse 84   Ht 5' (1.524 m)   Wt 151 lb (68.5 kg)   SpO2 91%   BMI 29.49 kg/m     Wt Readings from Last 3 Encounters:  11/15/21 151 lb (68.5 kg)  11/13/21 153 lb 8 oz (69.6 kg)  08/31/21 146 lb 1.6 oz (66.3 kg)    GEN:  Well nourished, well developed in no acute distress HEENT: Normal NECK: No  JVD; No carotid bruits LYMPHATICS: No lymphadenopathy CARDIAC: RRR, no murmurs, rubs, gallops RESPIRATORY:  Clear to auscultation without rales, wheezing or rhonchi  ABDOMEN: Soft, non-tender, non-distended MUSCULOSKELETAL:  No edema; No deformity  SKIN: Warm and dry NEUROLOGIC:  Alert and oriented x 3 PSYCHIATRIC:  Normal affect   ASSESSMENT:    1. Paroxysmal atrial fibrillation (HCC)   2. Long term (current) use of anticoagulants   3. Infiltrative cardiomyopathy ([place])   4. Controlled type 2 diabetes mellitus without complication, without long-term current use of insulin (HCC)    PLAN:    In order of problems listed above:  Atrial fibrillation: This was never particularly symptomatic.  Currently normal sinus rhythm.  On appropriate anticoagulation.  She is receiving calcium channel blockers for rate control for breakthrough events.  Antiarrhythmics are not indicated. Anticoagulation: So far well-tolerated without bleeding problems.  Normal renal function.  Most recent hemoglobin was 9.9 shortly after her surgery in May.  Not checked since. LVH: There was a remarkable discrepancy between the severity of LVH on echocardiography (septum and posterior wall measured at 1.9 cm) in the absence of any evidence of increased voltage on ECG.  Consider amyloidosis infiltrate of cardiomyopathy.  Scheduled for PYP scan. DM: Glucose controlled, hemoglobin A1c 6.6% a few weeks ago.  LDL slightly high at 102, but no known history of CAD or PAD.  Also has excellent HDL 67.           Medication Adjustments/Labs and Tests Ordered: Current medicines are reviewed at length with the patient today.  Concerns regarding medicines are outlined above.  Orders Placed This Encounter  Procedures   MYOCARDIAL AMYLOID IMAGING PLANAR AND SPECT   EKG 12-Lead   No orders of the defined types were placed in this encounter.   Patient Instructions  Medication Instructions:  No changes *If you need a refill on  your cardiac medications before your next appointment, please call your pharmacy*   Lab Work: None ordered If you have labs (blood work) drawn today and your tests are completely normal, you will receive your results only by: [place] (if you have MyChart) OR A paper copy in the mail If you have any lab test that is  abnormal or we need to change your treatment, we will call you to review the results.   Testing/Procedures: Myocardial Amyloid Planar and Spect Scan - Your physician has requested that you have a scan to evaluate for cardiac amyloidosis. This test "outlines" the heart to determine if you have any amyloid protein deposits located in your heart. This will be performed here at our [facility] location. The test will take approximately 2 hours to complete. Please do not wear any cologne or fragrances.   Follow-Up: At [facility], you and your health needs are our priority.  As part of our continuing mission to provide you with exceptional heart care, we have created designated Provider Care Teams.  These Care Teams include your primary Cardiologist (physician) and Advanced Practice Providers (APPs -  Physician Assistants and Nurse Practitioners) who all work together to provide you with the care you need, when you need it.  We recommend signing up for the patient portal called "MyChart".  Sign up information is provided on this After Visit Summary.  MyChart is used to connect with patients for Virtual Visits (Telemedicine).  Patients are able to view lab/test results, encounter notes, upcoming appointments, etc.  Non-urgent messages can be sent to your provider as well.   To learn more about what you can do with MyChart, go to [email or web address].    Your next appointment:   6 month(s)  The format for your next appointment:   In Person  Provider:   Sanda Klein, MD       Signed, Sanda Klein, MD  11/20/2021 3:39 PM    [place]

## 2021-11-16 ENCOUNTER — Telehealth: Payer: Self-pay | Admitting: Neurology

## 2021-11-16 NOTE — Telephone Encounter (Signed)
Mcarthur Rossetti Josem Kaufmann: [id number] (exp. 11/16/21 to 12/16/21) order sent to GI, they will reach out to the patient to schedule.

## 2021-11-20 ENCOUNTER — Encounter: Payer: Self-pay | Admitting: Cardiovascular Disease

## 2021-12-02 ENCOUNTER — Other Ambulatory Visit

## 2021-12-07 ENCOUNTER — Telehealth (HOSPITAL_COMMUNITY): Payer: Self-pay | Admitting: *Deleted

## 2021-12-07 NOTE — Telephone Encounter (Signed)
Close encounter 

## 2021-12-13 ENCOUNTER — Other Ambulatory Visit: Payer: Self-pay

## 2021-12-13 ENCOUNTER — Ambulatory Visit (HOSPITAL_COMMUNITY)
Admission: RE | Admit: 2021-12-13 | Discharge: 2021-12-13 | Disposition: A | Discharge status: Home / Self Care | Payer: Medicare PPO | Source: Ambulatory Visit | Attending: Internal Medicine | Admitting: Internal Medicine

## 2021-12-13 DIAGNOSIS — I428 Other cardiomyopathies: Secondary | ICD-10-CM | POA: Insufficient documentation

## 2021-12-13 MED ORDER — TECHNETIUM TC 99M PYROPHOSPHATE
21.0000 | Freq: Once | INTRAVENOUS | Status: AC
  Administered 2021-12-13: 21 via INTRAVENOUS

## 2021-12-20 ENCOUNTER — Telehealth: Payer: Self-pay | Admitting: Cardiovascular Disease

## 2021-12-20 NOTE — Telephone Encounter (Signed)
Patient's daughter is returning call to discuss myocardial amyloid results.

## 2021-12-20 NOTE — Telephone Encounter (Signed)
Called daughter and explained that this is a special process/procedure and Amanda Green is unavailable today. Verbalized understanding, she will await call from [facility]

## 2021-12-22 ENCOUNTER — Other Ambulatory Visit: Payer: Self-pay

## 2021-12-22 NOTE — Telephone Encounter (Signed)
Called patient, advised of results. Scheduled to see Dr.C on 01/18, offered sooner appointment but daughter was unable to bring her mother to that appointment.  They verbalized understanding of upcoming visit.

## 2022-01-03 ENCOUNTER — Encounter: Payer: Self-pay | Admitting: Cardiovascular Disease

## 2022-01-03 ENCOUNTER — Other Ambulatory Visit: Payer: Self-pay

## 2022-01-03 ENCOUNTER — Ambulatory Visit: Payer: Medicare PPO | Admitting: Cardiovascular Disease

## 2022-01-03 VITALS — BP 141/82 | HR 93 | Ht 60.0 in | Wt 151.0 lb

## 2022-01-03 DIAGNOSIS — D6869 Other thrombophilia: Secondary | ICD-10-CM

## 2022-01-03 DIAGNOSIS — E119 Type 2 diabetes mellitus without complications: Secondary | ICD-10-CM | POA: Diagnosis not present

## 2022-01-03 DIAGNOSIS — E859 Amyloidosis, unspecified: Secondary | ICD-10-CM

## 2022-01-03 DIAGNOSIS — I480 Paroxysmal atrial fibrillation: Secondary | ICD-10-CM

## 2022-01-03 DIAGNOSIS — E8582 Wild-type transthyretin-related (ATTR) amyloidosis: Secondary | ICD-10-CM

## 2022-01-03 NOTE — Progress Notes (Signed)
Cardiology Office Note:    Date:  01/04/2022   ID:  Amanda Green, DOB 03-25-1935, MRN [id number]  PCP:  Aurea Graff.[name], MD   [place] [facility] Cardiologist:  Sanda Klein, MD     Referring MD: Alroy Dust, Carlean Jews.[name], MD   Chief Complaint  Patient presents with   Atrial Fibrillation   Cardiomyopathy         History of Present Illness:    Amanda Green is a 87 y.o. female with a hx of asymptomatic atrial fibrillation with rapid ventricular response, hypertension, hyperlipidemia, type 2 diabetes mellitus, history of stroke, breast cancer status post right mastectomy 1999, status post left lumpectomy 2020 with recurrent disease and left mastectomy in May 2022.  She has developed some moderate cognitive difficulties.  She underwent an echocardiogram she was diagnosed with atrial fibrillation and this showed severe left ventricular hypertrophy, even though the electrocardiogram does not show hyper voltage.  She only had mild left atrial dilation and the mitral inflow was consistent with impaired relaxation, probably elevated filling pressures (E/e' 14-25).  A follow-up pyrophosphate scan was highly suggestive of cardiac amyloidosis.  She is here to discuss these findings and treatment.  As before, most of the discussion was with the patient's daughter, since Amanda Green is withdrawn and may be having some memory issues.  The patient specifically denies any chest pain at rest exertion, dyspnea at rest or with exertion, orthopnea, paroxysmal nocturnal dyspnea, syncope, palpitations, focal neurological deficits, intermittent claudication, lower extremity edema, unexplained weight gain, cough, hemoptysis or wheezing.  She has normal, regular rhythm today.  Has not had any falls, serious injuries or bleeding problems on anticoagulation.  Past Medical History:  Diagnosis Date   Arthritis    knees,   Breast cancer ([place]) 1999   right mastectomy   Breast cancer, left ([place])  05/2019   left lumpectomy   Diabetes mellitus    Dislocation of left shoulder joint    Family history of prostate cancer    Hyperlipidemia    Hypertension    Memory loss    Numbness and tingling of right lower extremity    Paresthesia    Stroke ([place])    light stroke - 2012 ,right leg nerve damage     Past Surgical History:  Procedure Laterality Date   ABDOMINAL HYSTERECTOMY     APPENDECTOMY     BREAST BIOPSY Left 01/19/2010   BREAST LUMPECTOMY WITH RADIOACTIVE SEED LOCALIZATION Left 07/17/2019   Procedure: LEFT BREAST LUMPECTOMY WITH RADIOACTIVE SEED LOCALIZATION;  Surgeon: Johnathan Hausen, MD;  Location: [facility];  Service: General;  Laterality: Left;   BREAST SURGERY     Right mastectomy [place] Right 09/30/2014   Procedure: RIGHT CARPAL TUNNEL RELEASE;  Surgeon: Daryll Brod, MD;  Location: [facility];  Service: Orthopedics;  Laterality: Right;   COLONOSCOPY     EYE SURGERY     cataracts   JOINT REPLACEMENT     lt total knee 2011   LIPOMA EXCISION  11/28/2011   Procedure: EXCISION LIPOMA;  Surgeon: Pedro Earls, MD;  Location: [facility];  Service: General;  Laterality: N/A;  excisino lipoma 4 cm back of neck   MASTECTOMY Right 1999   TOTAL MASTECTOMY Left 05/10/2021   Procedure: LEFT TOTAL MASTECTOMY;  Surgeon: Johnathan Hausen, MD;  Location: [facility];  Service: General;  Laterality: Left;    Current Medications: Current Meds  Medication Sig   apixaban (ELIQUIS)  5 MG TABS tablet Take by mouth.   atorvastatin (LIPITOR) 80 MG tablet Take 80 mg by mouth daily.   brimonidine-timolol (COMBIGAN) 0.2-0.5 % ophthalmic solution Place 1 drop into both eyes every 12 (twelve) hours.   calcium carbonate (OSCAL) 1500 (600 Ca) MG TABS tablet Take 1,500 mg by mouth 2 (two) times daily with a meal.   diltiazem (CARDIZEM CD) 240 MG 24 hr capsule Take 1 capsule (240 mg total) by mouth daily.   dorzolamide (TRUSOPT) 2 %  ophthalmic solution Place 1 drop into both eyes daily.   hydrochlorothiazide (MICROZIDE) 12.5 MG capsule Take 12.5 mg by mouth daily.   latanoprost (XALATAN) 0.005 % ophthalmic solution Place 1 drop into both eyes at bedtime.   lisinopril (ZESTRIL) 20 MG tablet Take 20 mg by mouth daily.   metFORMIN (GLUCOPHAGE) 500 MG tablet Take 1,000 mg by mouth daily.   vitamin B-12 (CYANOCOBALAMIN) 1000 MCG tablet Take 1,000 mcg by mouth daily.   vitamin C (ASCORBIC ACID) 500 MG tablet Take 500 mg by mouth daily.     Allergies:   Gabapentin   Social History   Socioeconomic History   Marital status: Widowed    Spouse name: Not on file   Number of children: 5   Years of education: HS - 12 years   Highest education level: Not on file  Occupational History   Occupation: Retired  Tobacco Use   Smoking status: Never   Smokeless tobacco: Never  Vaping Use   Vaping Use: Never used  Substance and Sexual Activity   Alcohol use: No   Drug use: No   Sexual activity: Not on file  Other Topics Concern   Not on file  Social History Narrative   Lives at home alone.   Right-handed.   1 cup caffeine daily.   Social Determinants of Health   Financial Resource Strain: Not on file  Food Insecurity: Not on file  Transportation Needs: Not on file  Physical Activity: Not on file  Stress: Not on file  Social Connections: Not on file     Family History: The patient's family history includes Cancer in her son and son; Esophageal cancer in her mother; Other in her father; Prostate cancer in her brother.  ROS:   Please see the history of present illness.     All other systems reviewed and are negative.  EKGs/Labs/Other Studies Reviewed:    The following studies were reviewed today: Echocardiogram April 2022  1. Consider infiltrative process such as amyloidosis due to severe LV  wall thickening. Left ventricular ejection fraction, by estimation, is  >75%. The left ventricle has hyperdynamic  function. The left ventricle has  no regional wall motion  abnormalities. There is severe left ventricular hypertrophy. Left  ventricular diastolic parameters are consistent with Grade I diastolic  dysfunction (impaired relaxation). Elevated left ventricular end-diastolic  pressure.   2. Right ventricular systolic function is normal. The right ventricular  size is normal. There is normal pulmonary artery systolic pressure. The  estimated right ventricular systolic pressure is 25.9 mmHg.   3. Left atrial size was mildly dilated.   4. The pericardial effusion is posterior to the left ventricle. Trivial  pericardial effusion.   5. The mitral valve is abnormal. Trivial mitral valve regurgitation.   6. The aortic valve is tricuspid. Aortic valve regurgitation is not  visualized. Mild aortic valve sclerosis is present, with no evidence of  aortic valve stenosis.   7. The inferior vena cava is normal  in size with greater than 50%  respiratory variability, suggesting right atrial pressure of 3 mmHg.   Comparison(s): Unable to locate echo for comparison.   Myocardial pyrophosphate scan 12/13/2021    By semi-quantitative assessment scan is consistent with heart uptake equal to rib uptake-Grade 2. Heart to contralateral lung ratio is between 1-1.5, indeterminate for amyloid.   Study is strongly suggestive of TTR amyloidosis (visual score of 2-3/ratio >1.5).   Prior study not available for comparison.   Overall cardiac uptake is similar to rib, suggestive of TTR amyloidosis.   EKG:  EKG is  ordered today.  The ekg ordered today demonstrates normal sinus rhythm, QS pattern in leads V1-V2, generalized low normal QRS voltage no evidence of LVH, no repolarization changes, QTC 439 ms  Recent Labs: 04/14/2021: TSH 2.740 04/15/2021: ALT 11; Magnesium 1.9 05/11/2021: BUN 17; Creatinine, Ser 0.77; Hemoglobin 9.9; Platelets 223; Potassium 4.0; Sodium 139  Recent Lipid Panel No results found for: CHOL,  TRIG, HDL, CHOLHDL, VLDL, LDLCALC, LDLDIRECT   Risk Assessment/Calculations:    CHA2DS2-VASc Score = 7   This indicates a 11.2% annual risk of stroke. The patient's score is based upon: CHF History: 0 HTN History: 1 Diabetes History: 1 Stroke History: 2 Vascular Disease History: 0 Age Score: 2 Gender Score: 1           Physical Exam:    VS:  BP (!) 141/82    Pulse 93    Ht 5' (1.524 m)    Wt 151 lb (68.5 kg)    SpO2 96%    BMI 29.49 kg/m     Wt Readings from Last 3 Encounters:  01/03/22 151 lb (68.5 kg)  12/13/21 151 lb (68.5 kg)  11/15/21 151 lb (68.5 kg)     General: Alert, oriented x3, no distress, overweight Head: no evidence of trauma, PERRL, EOMI, no exophtalmos or lid lag, no myxedema, no xanthelasma; normal ears, nose and oropharynx Neck: normal jugular venous pulsations and no hepatojugular reflux; brisk carotid pulses without delay and no carotid bruits Chest: clear to auscultation, no signs of consolidation by percussion or palpation, normal fremitus, symmetrical and full respiratory excursions Cardiovascular: normal position and quality of the apical impulse, regular rhythm, normal first and second heart sounds, no murmurs, rubs or gallops Abdomen: no tenderness or distention, no masses by palpation, no abnormal pulsatility or arterial bruits, normal bowel sounds, no hepatosplenomegaly Extremities: no clubbing, cyanosis or edema; 2+ radial, ulnar and brachial pulses bilaterally; 2+ right femoral, posterior tibial and dorsalis pedis pulses; 2+ left femoral, posterior tibial and dorsalis pedis pulses; no subclavian or femoral bruits Neurological: grossly nonfocal Psych: Normal mood and affect   ASSESSMENT:    1. Wild-type transthyretin-related (ATTR) amyloidosis (HCC)   2. Paroxysmal atrial fibrillation ([place])   3. Acquired thrombophilia ([place])   4. Controlled type 2 diabetes mellitus without complication, without long-term current use of insulin (HCC)      PLAN:    In order of problems listed above:  Atrial fibrillation: This was never particularly symptomatic.  Currently normal sinus rhythm.  On appropriate anticoagulation.  She is receiving calcium channel blockers for rate control for breakthrough events.  Antiarrhythmics are not indicated. Cardiac amyloidosis: Both her echocardiogram and her pyrophosphate scan are strongly supportive of a diagnosis of cardiac amyloidosis, probably transthyretin amyloidosis.  We discussed the implications of this diagnosis at length today.  First we need to make sure she does not have a hematological cause for this, although this is unlikely  with her presentation and age.  If her myeloma panel is negative, I would offer her treatment with Vyndamax.  We discussed the fact that this is an expensive medication that will require financial assistance.  The patient and her daughter were rather surprised by the whole discussion.  I provided him with some literature and they will think about their options while we wait for the results of the myeloma panel. Anticoagulation: So far well-tolerated without bleeding problems.  Normal renal function.   DM: Glucose controlled, hemoglobin A1c 6.6% a few weeks ago.  LDL slightly high at 102, but no known history of CAD or PAD.  Also has excellent HDL 67.           Medication Adjustments/Labs and Tests Ordered: Current medicines are reviewed at length with the patient today.  Concerns regarding medicines are outlined above.  Orders Placed This Encounter  Procedures   Multiple Myeloma Panel (SPEP&IFE w/QIG)   No orders of the defined types were placed in this encounter.    Patient Instructions  Medication Instructions:  No changes *If you need a refill on your cardiac medications before your next appointment, please call your pharmacy*   Lab Work: Your provider would like for you to have the following labs today: Myeloma Panel  If you have labs (blood work)  drawn today and your tests are completely normal, you will receive your results only by: [place] (if you have MyChart) OR A paper copy in the mail If you have any lab test that is abnormal or we need to change your treatment, we will call you to review the results.   Testing/Procedures: None ordered   Follow-Up: At [facility], you and your health needs are our priority.  As part of our continuing mission to provide you with exceptional heart care, we have created designated Provider Care Teams.  These Care Teams include your primary Cardiologist (physician) and Advanced Practice Providers (APPs -  Physician Assistants and Nurse Practitioners) who all work together to provide you with the care you need, when you need it.  We recommend signing up for the patient portal called "MyChart".  Sign up information is provided on this After Visit Summary.  MyChart is used to connect with patients for Virtual Visits (Telemedicine).  Patients are able to view lab/test results, encounter notes, upcoming appointments, etc.  Non-urgent messages can be sent to your provider as well.   To learn more about what you can do with MyChart, go to [email or web address].    Your next appointment:   Pending results    Signed, Sanda Klein, MD  01/04/2022 2:16 PM    [facility]

## 2022-01-03 NOTE — Patient Instructions (Signed)
Medication Instructions:  No changes *If you need a refill on your cardiac medications before your next appointment, please call your pharmacy*   Lab Work: Your provider would like for you to have the following labs today: Myeloma Panel  If you have labs (blood work) drawn today and your tests are completely normal, you will receive your results only by: [place] (if you have MyChart) OR A paper copy in the mail If you have any lab test that is abnormal or we need to change your treatment, we will call you to review the results.   Testing/Procedures: None ordered   Follow-Up: At [facility], you and your health needs are our priority.  As part of our continuing mission to provide you with exceptional heart care, we have created designated Provider Care Teams.  These Care Teams include your primary Cardiologist (physician) and Advanced Practice Providers (APPs -  Physician Assistants and Nurse Practitioners) who all work together to provide you with the care you need, when you need it.  We recommend signing up for the patient portal called "MyChart".  Sign up information is provided on this After Visit Summary.  MyChart is used to connect with patients for Virtual Visits (Telemedicine).  Patients are able to view lab/test results, encounter notes, upcoming appointments, etc.  Non-urgent messages can be sent to your provider as well.   To learn more about what you can do with MyChart, go to [email or web address].    Your next appointment:   Pending results

## 2022-01-04 ENCOUNTER — Encounter: Payer: Self-pay | Admitting: Cardiovascular Disease

## 2022-01-05 DIAGNOSIS — I10 Essential (primary) hypertension: Secondary | ICD-10-CM | POA: Diagnosis not present

## 2022-01-05 DIAGNOSIS — I4891 Unspecified atrial fibrillation: Secondary | ICD-10-CM | POA: Diagnosis not present

## 2022-01-08 LAB — MULTIPLE MYELOMA PANEL, SERUM
Albumin SerPl Elph-Mcnc: 3.3 g/dL (ref 2.9–4.4)
Albumin/Glob SerPl: 1 (ref 0.7–1.7)
Alpha 1: 0.3 g/dL (ref 0.0–0.4)
Alpha2 Glob SerPl Elph-Mcnc: 0.8 g/dL (ref 0.4–1.0)
B-Globulin SerPl Elph-Mcnc: 1.2 g/dL (ref 0.7–1.3)
Gamma Glob SerPl Elph-Mcnc: 1.1 g/dL (ref 0.4–1.8)
Globulin, Total: 3.4 g/dL (ref 2.2–3.9)
IgA/Immunoglobulin A, Serum: 291 mg/dL (ref 64–422)
IgG (Immunoglobin G), Serum: 1164 mg/dL (ref 586–1602)
IgM (Immunoglobulin M), Srm: 50 mg/dL (ref 26–217)
Total Protein: 6.7 g/dL (ref 6.0–8.5)

## 2022-01-10 ENCOUNTER — Ambulatory Visit
Admission: RE | Admit: 2022-01-10 | Discharge: 2022-01-10 | Disposition: A | Discharge status: Home / Self Care | Payer: Medicare PPO | Source: Ambulatory Visit | Attending: Neurology | Admitting: Neurology

## 2022-01-10 ENCOUNTER — Other Ambulatory Visit: Payer: Self-pay

## 2022-01-10 DIAGNOSIS — R269 Unspecified abnormalities of gait and mobility: Secondary | ICD-10-CM

## 2022-01-10 DIAGNOSIS — H401113 Primary open-angle glaucoma, right eye, severe stage: Secondary | ICD-10-CM | POA: Diagnosis not present

## 2022-01-10 DIAGNOSIS — I639 Cerebral infarction, unspecified: Secondary | ICD-10-CM | POA: Diagnosis not present

## 2022-01-10 DIAGNOSIS — M545 Low back pain, unspecified: Secondary | ICD-10-CM

## 2022-01-15 ENCOUNTER — Telehealth: Payer: Self-pay | Admitting: Neurology

## 2022-01-15 NOTE — Telephone Encounter (Signed)
Attempted to call pt, LVM for call back  °

## 2022-01-15 NOTE — Telephone Encounter (Signed)
IMPRESSION: Abnormal MRI scan of the lumbar spine showing prominent disc and endplate degenerative changes from L2-L5 most prominent at L3/4 with very severe endplate and disc degenerative changes resulting in right-sided foraminal encroachment likely involvement of exiting nerve root.  L4-5 also shows prominent endplate degenerative changes and disc osteophyte protrusion resulting in severe bilateral foraminal narrowing and likely encroachment on the nerve roots.  IMPRESSION: Abnormal MRI scan cervical spine without contrast showing prominent spondylitic changes from C3-C7 resulting in mild canal narrowing and moderate bilateral foraminal narrowing at C3/4, C4-5 and C5-6 but without any cord signal abnormalities.   Please call patient, MRI of lumbar spine showed multilevel degenerative changes, evidence of severe canal stenosis at L3-4, and right foraminal narrowing, L4-5 also has severe bilateral foraminal narrowing  MRI of cervical spine showed multilevel degenerative changes, no evidence of spinal cord compression  Above findings were explained, worsening low back pain, gait abnormalities  If family desires further evaluation, we can order EMG nerve conduction study, she needs to be accompanied by her family members to go over the test result, and review MRIs together  If they do not desire further evaluation, it is okay to keep March appointment with Janett Billow

## 2022-01-16 ENCOUNTER — Telehealth: Payer: Self-pay | Admitting: Cardiovascular Disease

## 2022-01-16 NOTE — Telephone Encounter (Signed)
I spoke to the patient's daughter, Inge Rise, to provide the MRI results. At this point, she would like to just keep the scheduled follow up on 02/19/22. She is going to speak further with her mother and sister. If they decide they want to move forward with the NCV/EMG, then they will contact our office.

## 2022-01-16 NOTE — Telephone Encounter (Signed)
Left a message for the patient's daughter to call back.  Croitoru, Dani Gobble, MD  Ricci Barker, RN No evidence of monoclonal protein. Cardiac abnormality is TTR amyloidosis and she may benefit from tafamidis. Have they thought about starting this treatment?If the want to go ahead with it, please let PharmD know to start the processof applying for Vyndamax.

## 2022-01-16 NOTE — Telephone Encounter (Signed)
Patient's daughter is returning call to discuss lab results. 

## 2022-01-17 NOTE — Telephone Encounter (Signed)
Pt's daughter returning call.

## 2022-01-17 NOTE — Telephone Encounter (Signed)
Patient's daughter has been made aware, per dpr. Message sent to pharmd.

## 2022-01-18 ENCOUNTER — Telehealth: Payer: Self-pay | Admitting: Pharmacist Clinician (PhC)/ Clinical Pharmacy Specialist

## 2022-01-18 NOTE — Telephone Encounter (Signed)
Spoke with daughter, she will come by the office to get paperwork for [facility]

## 2022-01-18 NOTE — Telephone Encounter (Signed)
LMOM for daughter to return call.  Patient to start Vyndamax, need to go over paperwork with daughter

## 2022-01-22 NOTE — Telephone Encounter (Signed)
Pt's daughter [name] called again asking for a call back from the nurse. Please advise at [phone].

## 2022-01-22 NOTE — Telephone Encounter (Signed)
I spoke to the patient's dgt. They would like to move forward with the NCV/EMG. Scheduled for 03/21/22 and placed on wait list for any cancellations.

## 2022-02-01 ENCOUNTER — Telehealth: Payer: Self-pay

## 2022-02-01 NOTE — Telephone Encounter (Signed)
Called and spoke w/pt and stated that we need their portion of the vyndalink form completed and returned to the office at their earliest convenience.

## 2022-02-05 ENCOUNTER — Other Ambulatory Visit: Payer: Self-pay | Admitting: Physician Assistant

## 2022-02-05 DIAGNOSIS — D120 Benign neoplasm of cecum: Secondary | ICD-10-CM | POA: Diagnosis not present

## 2022-02-05 DIAGNOSIS — C50919 Malignant neoplasm of unspecified site of unspecified female breast: Secondary | ICD-10-CM

## 2022-02-05 DIAGNOSIS — D649 Anemia, unspecified: Secondary | ICD-10-CM | POA: Diagnosis not present

## 2022-02-12 DIAGNOSIS — H401113 Primary open-angle glaucoma, right eye, severe stage: Secondary | ICD-10-CM | POA: Diagnosis not present

## 2022-02-13 ENCOUNTER — Other Ambulatory Visit: Payer: Self-pay | Admitting: Internal Medicine

## 2022-02-13 ENCOUNTER — Ambulatory Visit
Admission: RE | Admit: 2022-02-13 | Discharge: 2022-02-13 | Disposition: A | Discharge status: Home / Self Care | Payer: Medicare PPO | Source: Ambulatory Visit | Attending: Internal Medicine | Admitting: Internal Medicine

## 2022-02-13 DIAGNOSIS — M79651 Pain in right thigh: Secondary | ICD-10-CM

## 2022-02-13 DIAGNOSIS — R200 Anesthesia of skin: Secondary | ICD-10-CM | POA: Diagnosis not present

## 2022-02-13 DIAGNOSIS — M25551 Pain in right hip: Secondary | ICD-10-CM | POA: Diagnosis not present

## 2022-02-13 DIAGNOSIS — W19XXXA Unspecified fall, initial encounter: Secondary | ICD-10-CM | POA: Diagnosis not present

## 2022-02-19 ENCOUNTER — Ambulatory Visit: Payer: Medicare PPO | Admitting: Adult Health

## 2022-02-20 ENCOUNTER — Encounter: Payer: Medicare PPO | Admitting: Neurology

## 2022-02-21 ENCOUNTER — Telehealth: Payer: Self-pay | Admitting: Cardiovascular Disease

## 2022-02-21 NOTE — Telephone Encounter (Signed)
Patient calling in to see the status of the paperwork that needed to be filled out for her meds. She stated they told her a couple of weeks but she hasnt heard anything. Please advise ?

## 2022-02-21 NOTE — Telephone Encounter (Signed)
Attempted to call patient, left message for patient to call back to office.   

## 2022-02-21 NOTE — Telephone Encounter (Signed)
Patient is returning call.  °

## 2022-02-21 NOTE — Telephone Encounter (Signed)
Amanda Green has the paperwork and will complete when she is back from vacation ?

## 2022-02-21 NOTE — Telephone Encounter (Signed)
The patient's daughter has been made aware that we will know more about the approval when [name] is back in office.  ?

## 2022-02-21 NOTE — Telephone Encounter (Signed)
Returned call to patients Daughter (okay per DPR) who states that she has already returned paper work needed for vyndalink medication but states that she has not heard back in regards to this yet. Patients daughter would like to check on the status of this. Advised patient's daughter I would forward message to PharmD for review, patients daughter verbalized understanding.  ?

## 2022-02-22 ENCOUNTER — Other Ambulatory Visit: Payer: Medicare PPO

## 2022-02-26 ENCOUNTER — Ambulatory Visit
Admission: RE | Admit: 2022-02-26 | Discharge: 2022-02-26 | Disposition: A | Discharge status: Home / Self Care | Payer: Medicare PPO | Source: Ambulatory Visit | Attending: Physician Assistant | Admitting: Physician Assistant

## 2022-02-26 ENCOUNTER — Other Ambulatory Visit: Payer: Self-pay

## 2022-02-26 DIAGNOSIS — D3500 Benign neoplasm of unspecified adrenal gland: Secondary | ICD-10-CM | POA: Diagnosis not present

## 2022-02-26 DIAGNOSIS — C50919 Malignant neoplasm of unspecified site of unspecified female breast: Secondary | ICD-10-CM

## 2022-02-26 DIAGNOSIS — K5730 Diverticulosis of large intestine without perforation or abscess without bleeding: Secondary | ICD-10-CM | POA: Diagnosis not present

## 2022-02-26 DIAGNOSIS — K429 Umbilical hernia without obstruction or gangrene: Secondary | ICD-10-CM | POA: Diagnosis not present

## 2022-02-26 DIAGNOSIS — D120 Benign neoplasm of cecum: Secondary | ICD-10-CM

## 2022-02-26 DIAGNOSIS — D7389 Other diseases of spleen: Secondary | ICD-10-CM | POA: Diagnosis not present

## 2022-02-26 MED ORDER — IOPAMIDOL (ISOVUE-300) INJECTION 61%
100.0000 mL | Freq: Once | INTRAVENOUS | Status: AC | PRN
  Administered 2022-02-26: 100 mL via INTRAVENOUS

## 2022-03-06 DIAGNOSIS — I10 Essential (primary) hypertension: Secondary | ICD-10-CM | POA: Diagnosis not present

## 2022-03-06 DIAGNOSIS — M792 Neuralgia and neuritis, unspecified: Secondary | ICD-10-CM | POA: Diagnosis not present

## 2022-03-06 DIAGNOSIS — Z0000 Encounter for general adult medical examination without abnormal findings: Secondary | ICD-10-CM | POA: Diagnosis not present

## 2022-03-06 DIAGNOSIS — M8588 Other specified disorders of bone density and structure, other site: Secondary | ICD-10-CM | POA: Diagnosis not present

## 2022-03-06 DIAGNOSIS — E1169 Type 2 diabetes mellitus with other specified complication: Secondary | ICD-10-CM | POA: Diagnosis not present

## 2022-03-06 DIAGNOSIS — E7800 Pure hypercholesterolemia, unspecified: Secondary | ICD-10-CM | POA: Diagnosis not present

## 2022-03-13 ENCOUNTER — Encounter: Payer: Self-pay | Admitting: Neurology

## 2022-03-13 ENCOUNTER — Encounter: Payer: Medicare PPO | Admitting: Neurology

## 2022-03-13 NOTE — Telephone Encounter (Signed)
I spoke to [facility] this morning.  They have the paperwork and are starting to process ? ?

## 2022-03-13 NOTE — Telephone Encounter (Signed)
Patient's daughter is following up, requesting to check on the status of paperwork. ?

## 2022-03-13 NOTE — Telephone Encounter (Signed)
Spoke to daughter (ok per DPR)-aware and verbalized understanding.  ?

## 2022-03-14 DIAGNOSIS — D120 Benign neoplasm of cecum: Secondary | ICD-10-CM | POA: Diagnosis not present

## 2022-03-15 ENCOUNTER — Encounter: Payer: Medicare PPO | Admitting: Diagnostic Neuroimaging

## 2022-03-15 ENCOUNTER — Ambulatory Visit (INDEPENDENT_AMBULATORY_CARE_PROVIDER_SITE_OTHER): Payer: Medicare PPO | Admitting: Diagnostic Neuroimaging

## 2022-03-15 DIAGNOSIS — M48062 Spinal stenosis, lumbar region with neurogenic claudication: Secondary | ICD-10-CM | POA: Insufficient documentation

## 2022-03-15 DIAGNOSIS — R269 Unspecified abnormalities of gait and mobility: Secondary | ICD-10-CM

## 2022-03-15 DIAGNOSIS — Z0289 Encounter for other administrative examinations: Secondary | ICD-10-CM
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
## 2022-03-16 NOTE — Procedures (Signed)
? ?[facility] ? ?NCS (NERVE CONDUCTION STUDY) WITH EMG (ELECTROMYOGRAPHY) REPORT ? ? ?STUDY DATE: 03/15/22 ?PATIENT NAME: Amanda Green ?DOB: [birth year] ?MRN: [id number] ? ?ORDERING CLINICIAN: Marcial Pacas, MD PhD  ? ?TECHNOLOGIST: Sherre Scarlet ?ELECTROMYOGRAPHER: Maxine Huynh R. Jovanni Eckhart, MD ? ?CLINICAL INFORMATION: 87 year old female with right arm and right leg numbness.  On Eliquis anticoagulation. ? ?FINDINGS: ?NERVE CONDUCTION STUDY: ? ?Bilateral median, right ulnar, right peroneal and right tibial motor responses are normal. ? ?Bilateral median sensory responses have prolonged peak latencies and normal amplitudes. ? ?Right sural, right superficial peroneal and right ulnar sensory responses are normal. ? ?Right ulnar and right tibial F wave latencies are normal. ? ? ? ?NEEDLE ELECTROMYOGRAPHY: ? ?Needle examination of right upper and lower extremities notable for chronic denervation in right tibialis anterior and right gastrocnemius muscles on exertion.  No abnormal spontaneous activity. ? ? ?IMPRESSION:  ? ?Abnormal study demonstrating: ?-Mild bilateral median sensory neuropathies at the wrist consistent with mild bilateral carpal tunnel syndrome. ?-Mild chronic denervation of right tibialis anterior and right gastrocnemius muscles at rest, may be related to underlying right L5-S1 radiculopathies. ? ? ? ?INTERPRETING PHYSICIAN:  ?Penni Bombard, MD ?Certified in Neurology, Neurophysiology and Neuroimaging ? ?[facility] ?[address] ?[place],  [address] ?[phone] ? ? ?[place] ?   ?Nerve / Sites Muscle Latency Ref. Amplitude Ref. Rel Amp Segments Distance Velocity Ref. Area  ?  ms ms mV mV %  cm m/s m/s mVms  ?R Median - APB  ?   Wrist APB 3.9 ?4.4 5.9 ?4.0 100 Wrist - APB 7   18.8  ?   Upper arm APB 8.0  5.5  93.8 Upper arm - Wrist 20 49 ?49 19.2  ?L Median - APB  ?   Wrist APB 3.9 ?4.4 4.0 ?4.0 100 Wrist - APB 7   11.9  ?   Upper arm APB 7.7  3.7  92.5 Upper  arm - Wrist 19 49 ?49 12.3  ?R Ulnar - ADM  ?   Wrist ADM 2.9 ?3.3 9.9 ?6.0 100 Wrist - ADM 7   35.6  ?   B.Elbow ADM 6.3  9.2  93.2 B.Elbow - Wrist 18 53 ?49 34.2  ?   A.Elbow ADM 8.1  8.8  95.4 A.Elbow - B.Elbow 10 53 ?49 34.5  ?R Peroneal - EDB  ?   Ankle EDB 3.5 ?6.5 2.2 ?2.0 100 Ankle - EDB 9   6.1  ?   Fib head EDB 9.3  1.9  85.8 Fib head - Ankle 26 45 ?44 5.4  ?   Pop fossa EDB 11.5  1.7  90.7 Pop fossa - Fib head 10 45 ?44 5.2  ?       Pop fossa - Ankle      ?R Tibial - AH  ?   Ankle AH 3.8 ?5.8 5.9 ?4.0 100 Ankle - AH 9   13.1  ?   Pop fossa AH 12.4  4.8  82 Pop fossa - Ankle 37 43 ?41 11.5  ?             ?[place] ?   ?Nerve / Sites Rec. Site Peak Lat Ref.  Amp Ref. Segments Distance  ?  ms ms ?V ?V  cm  ?R Sural - Ankle (Calf)  ?   Calf Ankle 2.5 ?4.4 6 ?6 Calf - Ankle 10  ?R Superficial peroneal - Ankle  ?   Lat leg Ankle  2.8 ?4.4 5 ?6 Lat leg - Ankle 10  ?R Median - Orthodromic (Dig II, Mid palm)  ?   Dig II Wrist 3.9 ?3.4 16 ?10 Dig II - Wrist 13  ?L Median - Orthodromic (Dig II, Mid palm)  ?   Dig II Wrist 3.6 ?3.4 11 ?10 Dig II - Wrist 13  ?R Ulnar - Orthodromic, (Dig V, Mid palm)  ?   Dig V Wrist 3.1 ?3.1 13 ?5 Dig V - Wrist 11  ?             ?F  Wave ?   ?Nerve F Lat Ref.  ? ms ms  ?R Tibial - AH 53.2 ?56.0  ?R Ulnar - ADM 28.8 ?32.0  ?       ?EMG Summary Table   ? Spontaneous MUAP Recruitment  ?Muscle IA Fib PSW Fasc Other Amp Dur. Poly Pattern  ?R. Vastus medialis Normal None None None _______ Normal Normal Normal Normal  ?R. Tibialis anterior Normal None None None _______ Increased Normal Normal Reduced  ?R. Gastrocnemius (Medial head) Normal None None None _______ Increased Normal Normal Reduced  ?R. Deltoid Normal None None None _______ Normal Normal Normal Normal  ?R. Biceps brachii Normal None None None _______ Normal Normal Normal Normal  ?R. Triceps brachii Normal None None None _______ Normal Normal Normal Normal  ?R. Flexor carpi radialis Normal None None None _______ Normal Normal Normal  Normal  ?R. First dorsal interosseous Normal None None None _______ Normal Normal Normal Normal  ? ?  ?

## 2022-03-19 DIAGNOSIS — Z961 Presence of intraocular lens: Secondary | ICD-10-CM | POA: Diagnosis not present

## 2022-03-19 DIAGNOSIS — H401132 Primary open-angle glaucoma, bilateral, moderate stage: Secondary | ICD-10-CM | POA: Diagnosis not present

## 2022-03-19 DIAGNOSIS — E119 Type 2 diabetes mellitus without complications: Secondary | ICD-10-CM | POA: Diagnosis not present

## 2022-03-21 ENCOUNTER — Encounter: Payer: Medicare PPO | Admitting: Neurology

## 2022-03-22 ENCOUNTER — Telehealth: Payer: Self-pay | Admitting: Cardiovascular Disease

## 2022-03-22 NOTE — Telephone Encounter (Signed)
Amanda Green is calling stating the patient did not date page 5 of forms. She reports she faxed this form over on Tuesday for our office to assist in regards to it. Please advise.  ?

## 2022-03-22 NOTE — Telephone Encounter (Signed)
Left message for Amanda Green that dr c is not here today and will forward for his nurse to review. ?

## 2022-03-26 NOTE — Telephone Encounter (Signed)
Date completed and sent back ?

## 2022-03-28 ENCOUNTER — Telehealth: Payer: Self-pay | Admitting: Cardiovascular Disease

## 2022-03-28 NOTE — Telephone Encounter (Signed)
Daughter calling in looking for update on the medication for patient. Please advise ?

## 2022-03-28 NOTE — Telephone Encounter (Signed)
Spoke with pt daughter, aware paperwork was faxed into the company on 03/26/22. Will wait to hear from the company. ?

## 2022-04-04 ENCOUNTER — Other Ambulatory Visit: Payer: Self-pay | Admitting: Pharmacist Clinician (PhC)/ Clinical Pharmacy Specialist

## 2022-04-04 MED ORDER — VYNDAMAX 61 MG PO CAPS: 61.0000 mg | ORAL_CAPSULE | Freq: Every day | ORAL | 12 refills | Status: DC

## 2022-04-04 NOTE — Telephone Encounter (Signed)
Vyndamax rx sent to specialty pharmacy ?

## 2022-04-10 DIAGNOSIS — H401132 Primary open-angle glaucoma, bilateral, moderate stage: Secondary | ICD-10-CM | POA: Diagnosis not present

## 2022-04-10 DIAGNOSIS — Z961 Presence of intraocular lens: Secondary | ICD-10-CM | POA: Diagnosis not present

## 2022-04-10 DIAGNOSIS — E119 Type 2 diabetes mellitus without complications: Secondary | ICD-10-CM | POA: Diagnosis not present

## 2022-04-11 NOTE — Telephone Encounter (Signed)
Patient's daughter is following up. She would like to know if we have an update of the status of the paperwork. Please advise. ?

## 2022-04-11 NOTE — Telephone Encounter (Signed)
Returned call to [place].  Rx sent to [facility] last Wed (April 19).  Daughter will be on the lookout for this and call if not received by next week.   ?

## 2022-04-25 ENCOUNTER — Telehealth: Payer: Self-pay | Admitting: Cardiovascular Disease

## 2022-04-25 NOTE — Telephone Encounter (Signed)
?*  STAT* If patient is at the pharmacy, call can be transferred to refill team. ? ? ?1. Which medications need to be refilled? (please list name of each medication and dose if known) Tafamidis (VYNDAMAX) 61 MG CAPS ? ?2. Which pharmacy/location (including street and city if local pharmacy) is medication to be sent to? [place], [place] ? ?3. Do they need a 30 day or 90 day supply? 90 ? ?

## 2022-04-27 MED ORDER — VYNDAMAX 61 MG PO CAPS: 61.0000 mg | ORAL_CAPSULE | Freq: Every day | ORAL | 12 refills | Status: DC

## 2022-04-27 NOTE — Telephone Encounter (Signed)
Refill sent to [facility]. ?

## 2022-04-27 NOTE — Telephone Encounter (Signed)
Patient's daughter is following up regarding refill requesting. She states they medication should not have gone through [facility]. ?

## 2022-05-13 IMAGING — DX DG HIP (WITH OR WITHOUT PELVIS) 2-3V*R*
2 series · 2 of 2 positions shown · non-contrast
Comparison: None.

CLINICAL DATA: Right hip pain radiating to right femur, fell 2
weeks ago

EXAM:
DG HIP (WITH OR WITHOUT PELVIS) 2-3V RIGHT

[dg hip unilat w or w/o pelvis 2-3 views  (1 of 2)]
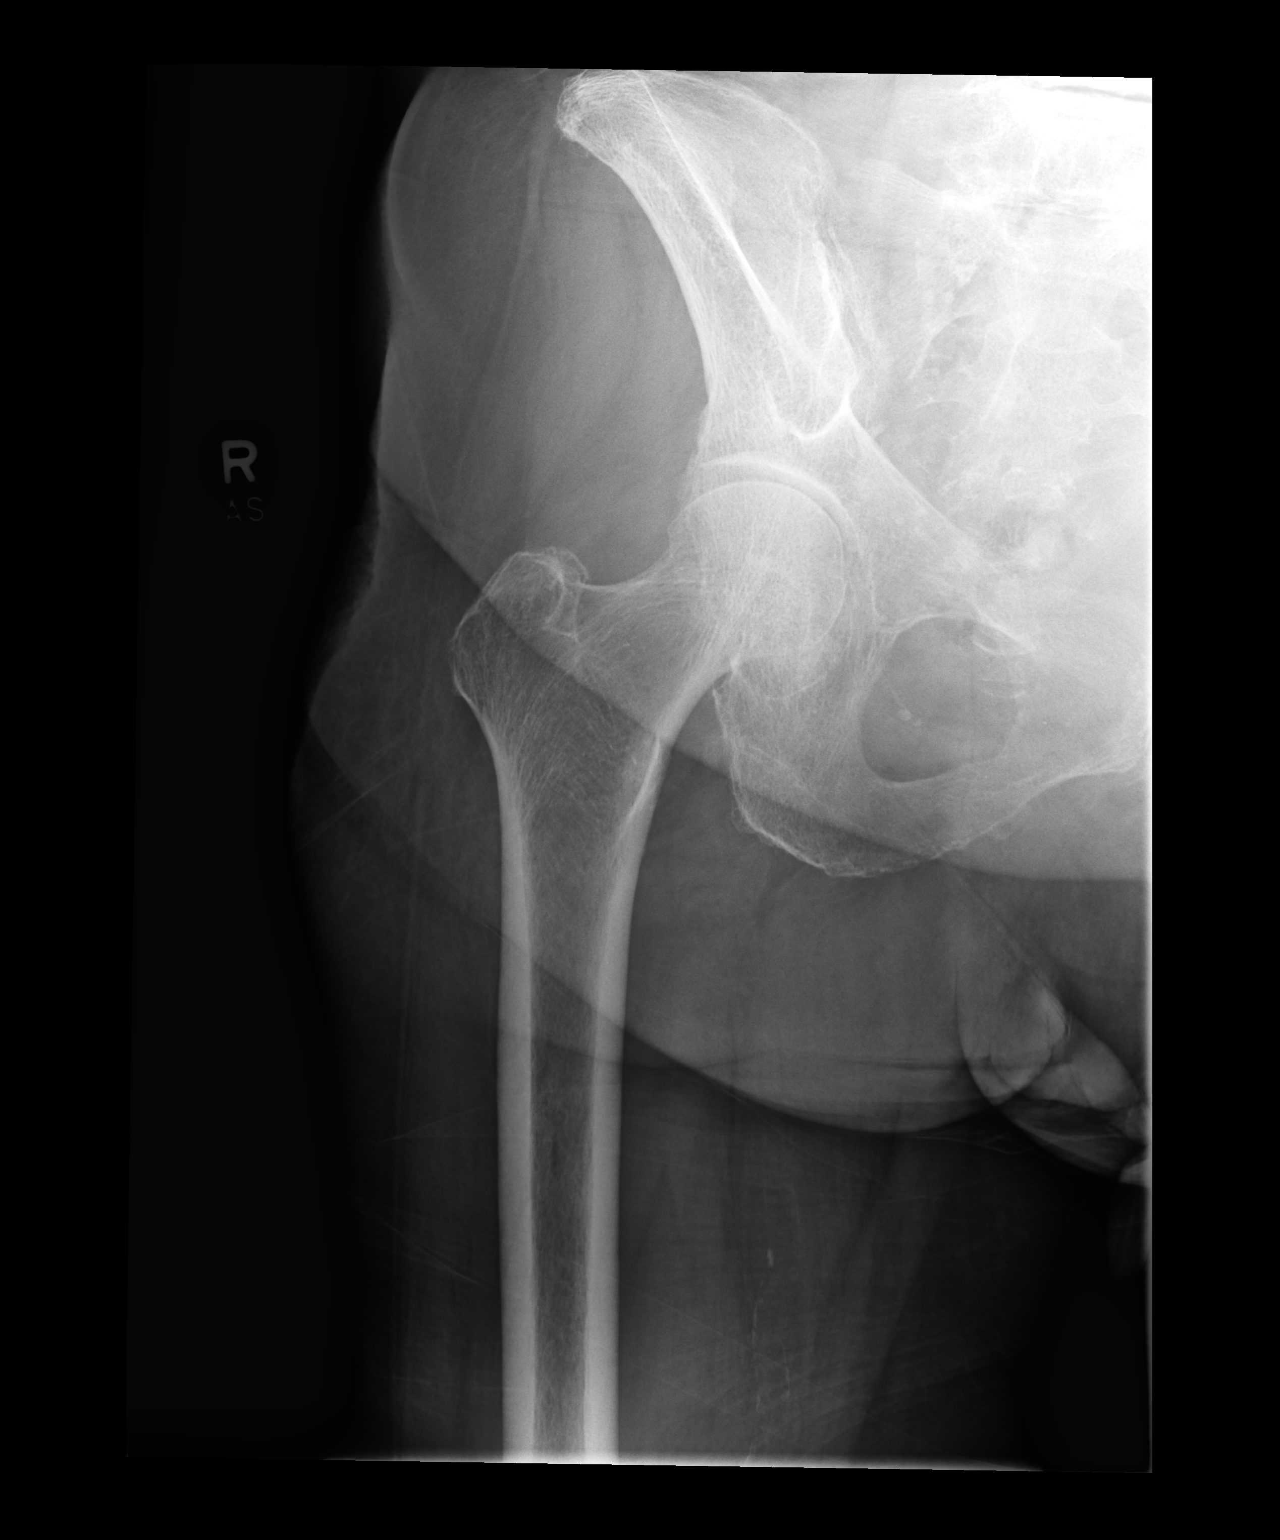

[dg hip unilat w or w/o pelvis 2-3 views  (2 of 2)]
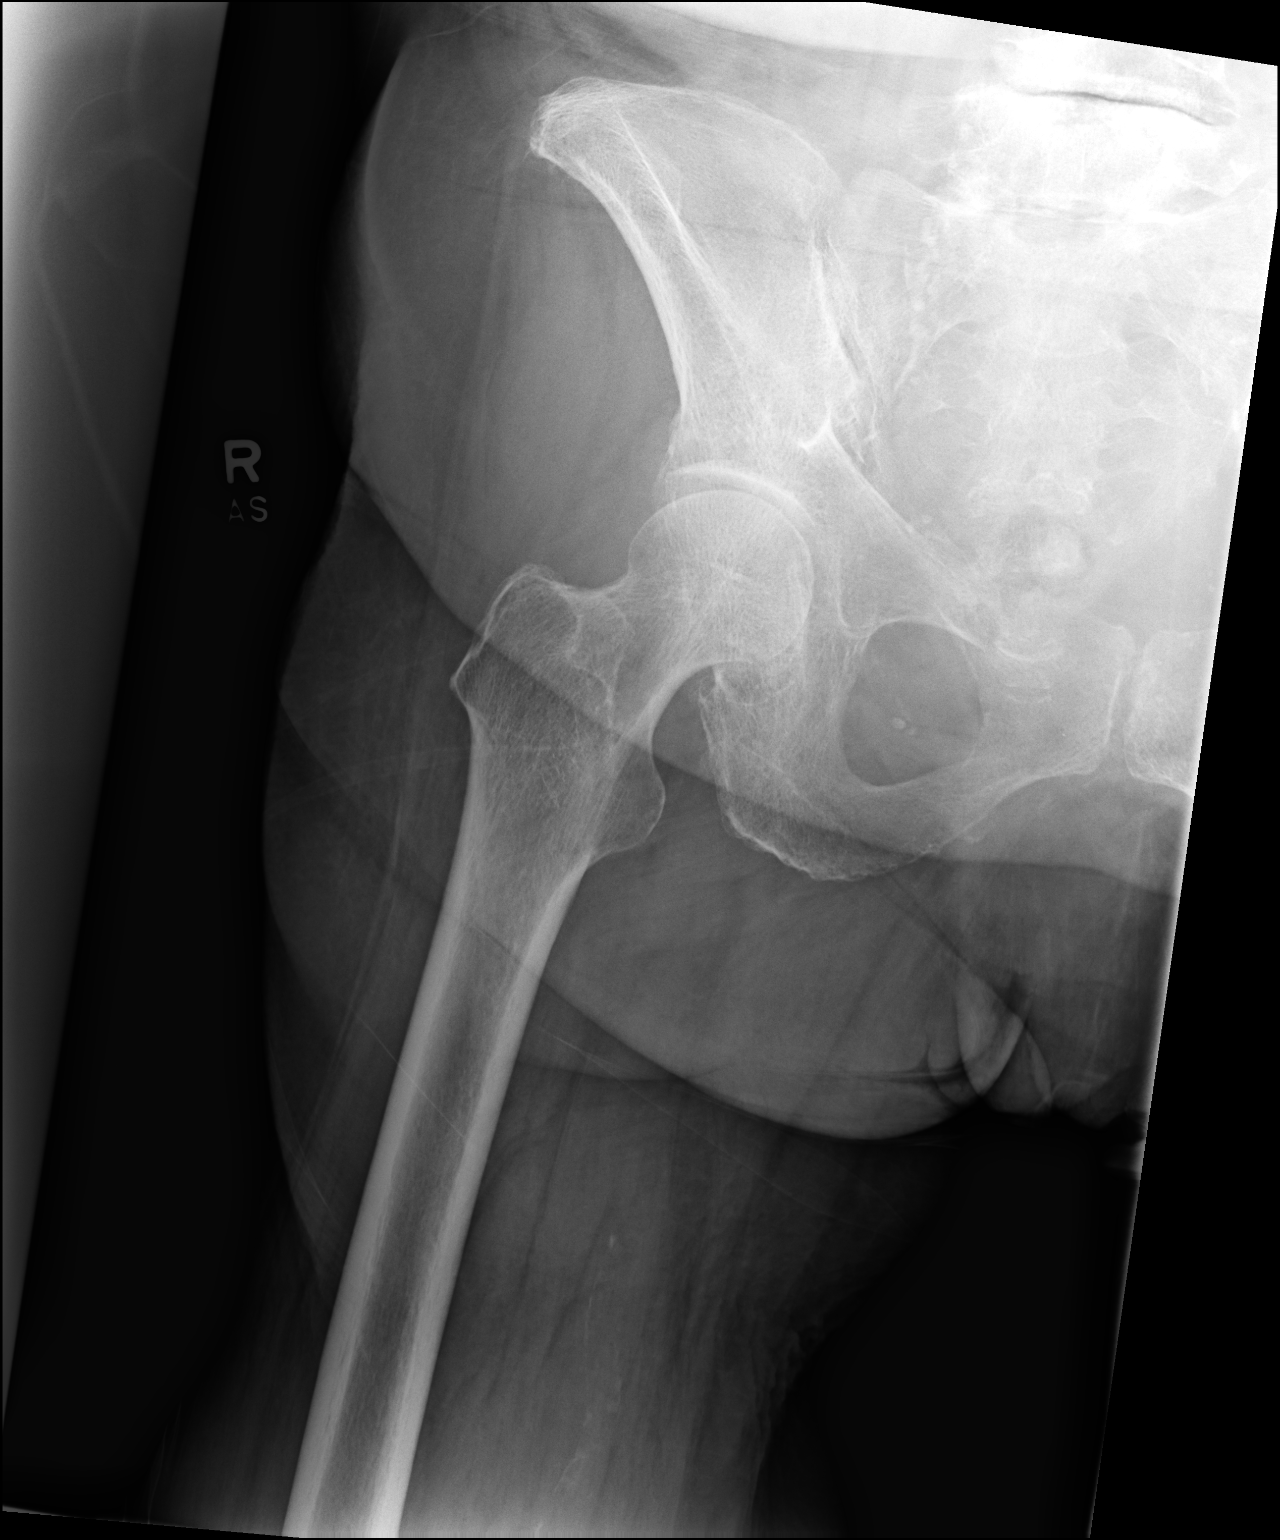

[2 of 2 positions shown; findings below may reference images not displayed]

FINDINGS: Frontal and frogleg lateral views of the right hip are obtained. No
acute fracture, subluxation, or dislocation. Mild joint space
narrowing consistent with osteoarthritis. Soft tissues are
unremarkable. Prominent lower lumbar spondylosis.
IMPRESSION: 1. Mild osteoarthritis.  No displaced fracture.

## 2022-05-15 ENCOUNTER — Telehealth: Payer: Self-pay | Admitting: Cardiovascular Disease

## 2022-05-15 NOTE — Telephone Encounter (Signed)
New Message:     Daughter says they are having problem getting patient's Tafamidis. Pharmacist said they are having a problem with getting the medicine.  :

## 2022-05-15 NOTE — Telephone Encounter (Signed)
Spoke with daughter who reports her local pharmacy does not have tafamidis and was given number to [facility]. I called and spoke wit pharmacist Colletta [place] and gave her the prescription order for tafamidis 61 mg capsule once daily by mouth. I informed daughter that [facility] will [facility] to schedule [facility] order delivery or local pick-up. Number for [facility] customer care is [phone].

## 2022-05-16 ENCOUNTER — Other Ambulatory Visit: Payer: Self-pay

## 2022-05-18 ENCOUNTER — Telehealth: Payer: Self-pay | Admitting: Cardiovascular Disease

## 2022-05-18 NOTE — Telephone Encounter (Signed)
Pt c/o medication issue:  1. Name of Medication:   Tafamidis (VYNDAMAX) 61 MG CAPS    2. How are you currently taking this medication (dosage and times per day)? Take 61 mg by mouth daily.  3. Are you having a reaction (difficulty breathing--STAT)?  No  4. What is your medication issue?  Pt's daughter called and stated that pharmacy called her yesterday evening saying that they need more info before medication can be filled. Please advise

## 2022-05-21 NOTE — Telephone Encounter (Signed)
Reached out to specialty pharmacy - copay set at $1522/month.  Patient noted this was cost prohibitive and did not have med shipped.  Will reach out to [facility] to address.

## 2022-05-22 ENCOUNTER — Ambulatory Visit: Admitting: Cardiovascular Disease

## 2022-05-26 NOTE — Telephone Encounter (Signed)
Spoke with [facility].  They need patient/family to call and report that copay of $1522/month is cost prohibitive.  Spoke with patient daughter and she will call them.

## 2022-05-30 ENCOUNTER — Encounter: Payer: Self-pay | Admitting: Cardiovascular Disease

## 2022-05-31 NOTE — Telephone Encounter (Signed)
Spoke with [facility] - pt should be getting medication at no charge, first supply to be shipped this week

## 2022-06-06 ENCOUNTER — Other Ambulatory Visit: Payer: Self-pay

## 2022-06-08 DIAGNOSIS — Z961 Presence of intraocular lens: Secondary | ICD-10-CM | POA: Diagnosis not present

## 2022-06-08 DIAGNOSIS — H401113 Primary open-angle glaucoma, right eye, severe stage: Secondary | ICD-10-CM | POA: Diagnosis not present

## 2022-06-08 DIAGNOSIS — H401122 Primary open-angle glaucoma, left eye, moderate stage: Secondary | ICD-10-CM | POA: Diagnosis not present

## 2022-06-08 DIAGNOSIS — E119 Type 2 diabetes mellitus without complications: Secondary | ICD-10-CM | POA: Diagnosis not present

## 2022-06-14 DIAGNOSIS — H401113 Primary open-angle glaucoma, right eye, severe stage: Secondary | ICD-10-CM | POA: Diagnosis not present

## 2022-06-29 ENCOUNTER — Ambulatory Visit: Admitting: Nurse Practitioner

## 2022-07-05 ENCOUNTER — Telehealth: Payer: Self-pay | Admitting: Cardiovascular Disease

## 2022-07-05 NOTE — Telephone Encounter (Signed)
Spoke to [facility] pharmacy advised I will send message to our pharmacist.

## 2022-07-05 NOTE — Telephone Encounter (Signed)
Debbie from [facility] called to check the status of a Prior Authorization for the   Tafamidis [facility]) 61 MG CAPS  She can be reached directly at [phone] [id number]

## 2022-07-05 NOTE — Telephone Encounter (Signed)
Returned call - pt qualified for free med from manufacturer.   Pharmacy aware

## 2022-07-09 ENCOUNTER — Ambulatory Visit (INDEPENDENT_AMBULATORY_CARE_PROVIDER_SITE_OTHER): Payer: Medicare PPO | Admitting: Adult Health

## 2022-07-09 ENCOUNTER — Telehealth: Payer: Self-pay | Admitting: Adult Health

## 2022-07-09 ENCOUNTER — Encounter: Payer: Self-pay | Admitting: Adult Health

## 2022-07-09 VITALS — BP 154/71 | HR 78 | Ht 63.0 in | Wt 158.0 lb

## 2022-07-09 DIAGNOSIS — I6381 Other cerebral infarction due to occlusion or stenosis of small artery: Secondary | ICD-10-CM

## 2022-07-09 DIAGNOSIS — R4189 Other symptoms and signs involving cognitive functions and awareness: Secondary | ICD-10-CM

## 2022-07-09 DIAGNOSIS — M48062 Spinal stenosis, lumbar region with neurogenic claudication: Secondary | ICD-10-CM

## 2022-07-09 DIAGNOSIS — R269 Unspecified abnormalities of gait and mobility: Secondary | ICD-10-CM | POA: Diagnosis not present

## 2022-07-09 MED ORDER — MEMANTINE HCL 10 MG PO TABS: 10.0000 mg | ORAL_TABLET | Freq: Two times a day (BID) | ORAL | 5 refills | Status: DC

## 2022-07-09 MED ORDER — MEMANTINE HCL 5 MG PO TABS: ORAL_TABLET | ORAL | 0 refills | Status: DC

## 2022-07-09 NOTE — Patient Instructions (Addendum)
You will be called to start home health therapy including phyiscal and speech (cognitive) therapy  Start Namenda titration as follows start '5mg'$  nightly for 7 days increase to '5mg'$  twice daily for 7 days  increase to '5mg'$  AM and '10mg'$  PM for 7 days  Increase to '10mg'$  twice daily (continue this dosing ongoing)  If there are any issues or concerns taking this medication, please let me know!   Very important to increase memory exercises at home such as cross word puzzles, word search, soduku, reading, card games as well as increasing activity as tolerated, ensuring good sleep and healthy diet    Follow up in 6 months or call earlier if needed

## 2022-07-09 NOTE — Telephone Encounter (Signed)
[facility] is taking this patient. 

## 2022-07-09 NOTE — Telephone Encounter (Signed)
Pt daughter [name]) called and stated that Pt medication was sent to the wrong pharmacy. [name] is requesting medication be sent to [facility]  [phone].
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
## 2022-07-10 ENCOUNTER — Other Ambulatory Visit: Payer: Self-pay

## 2022-07-10 DIAGNOSIS — R4189 Other symptoms and signs involving cognitive functions and awareness: Secondary | ICD-10-CM

## 2022-07-10 DIAGNOSIS — I6381 Other cerebral infarction due to occlusion or stenosis of small artery: Secondary | ICD-10-CM

## 2022-07-10 MED ORDER — MEMANTINE HCL 5 MG PO TABS: ORAL_TABLET | ORAL | 0 refills | Status: DC

## 2022-07-10 MED ORDER — MEMANTINE HCL 10 MG PO TABS: 10.0000 mg | ORAL_TABLET | Freq: Two times a day (BID) | ORAL | 5 refills | Status: DC

## 2022-07-10 NOTE — Telephone Encounter (Signed)
Medication has been sent to [facility]

## 2022-07-24 ENCOUNTER — Ambulatory Visit (INDEPENDENT_AMBULATORY_CARE_PROVIDER_SITE_OTHER): Payer: Medicare PPO | Admitting: Nurse Practitioner

## 2022-07-24 ENCOUNTER — Encounter: Payer: Self-pay | Admitting: Nurse Practitioner

## 2022-07-24 VITALS — BP 126/74 | HR 83 | Ht 63.0 in | Wt 157.0 lb

## 2022-07-24 DIAGNOSIS — E8582 Wild-type transthyretin-related (ATTR) amyloidosis: Secondary | ICD-10-CM | POA: Diagnosis not present

## 2022-07-24 DIAGNOSIS — E119 Type 2 diabetes mellitus without complications: Secondary | ICD-10-CM

## 2022-07-24 DIAGNOSIS — I480 Paroxysmal atrial fibrillation: Secondary | ICD-10-CM | POA: Diagnosis not present

## 2022-07-24 NOTE — Progress Notes (Signed)
Office Visit    Patient Name: Amanda Green Date of Encounter: 07/24/2022  Primary Care Provider:  Alroy Dust, L.[name], MD Primary Cardiologist:  Sanda Klein, MD  Chief Complaint    87 year old female with a history of asymptomatic atrial fibrillation, TTR amyloidosis, hypertension, hyperlipidemia, CVA, type 2 diabetes, and breast cancer s/p right mastectomy in 1999, s/p left lumpectomy in 2020 with recurrent disease in left mastectomy and 04/2021 who presents for follow-up related to amyloidosis and atrial fibrillation.  Past Medical History    Past Medical History:  Diagnosis Date   Arthritis    knees,   Breast cancer ([place]) 1999   right mastectomy   Breast cancer, left ([place]) 05/2019   left lumpectomy   Diabetes mellitus    Dislocation of left shoulder joint    Family history of prostate cancer    Hyperlipidemia    Hypertension    Memory loss    Numbness and tingling of right lower extremity    Paresthesia    Stroke ([place])    light stroke - 2012 ,right leg nerve damage    Past Surgical History:  Procedure Laterality Date   ABDOMINAL HYSTERECTOMY     APPENDECTOMY     BREAST BIOPSY Left 01/19/2010   BREAST LUMPECTOMY WITH RADIOACTIVE SEED LOCALIZATION Left 07/17/2019   Procedure: LEFT BREAST LUMPECTOMY WITH RADIOACTIVE SEED LOCALIZATION;  Surgeon: Johnathan Hausen, MD;  Location: [facility];  Service: General;  Laterality: Left;   BREAST SURGERY     Right mastectomy [place] Right 09/30/2014   Procedure: RIGHT CARPAL TUNNEL RELEASE;  Surgeon: Daryll Brod, MD;  Location: [facility];  Service: Orthopedics;  Laterality: Right;   COLONOSCOPY     EYE SURGERY     cataracts   JOINT REPLACEMENT     lt total knee 2011   LIPOMA EXCISION  11/28/2011   Procedure: EXCISION LIPOMA;  Surgeon: Pedro Earls, MD;  Location: [facility];  Service: General;  Laterality: N/A;  excisino lipoma 4 cm back of neck    MASTECTOMY Right 1999   TOTAL MASTECTOMY Left 05/10/2021   Procedure: LEFT TOTAL MASTECTOMY;  Surgeon: Johnathan Hausen, MD;  Location[facility];  Service: General;  Laterality: Left;    Allergies  Allergies  Allergen Reactions   Gabapentin     Drowsiness    History of Present Illness    87 year old female with the above past history of asymptomatic atrial fibrillation, TTR amyloidosis, hypertension, hyperlipidemia, CVA, type 2 diabetes, and breast cancer s/p right mastectomy in 1999, s/p left lumpectomy in 2020 with recurrent disease in left mastectomy and 04/2021.  She was diagnosed with atrial fibrillation in April 2022.  Echocardiogram at the time showed severe LVH, mild left atrial dilation, mitral inflow was consistent with impaired relaxation, probable elevated filling pressures.  Follow-up PYP scan was highly suggestive of cardiac amyloidosis.  She was started on Eliquis.  She also has a history of memory impairment.  She was last seen in the office on 01/03/2022 and was stable overall from a cardiac standpoint.  She was maintaining sinus rhythm at the time.  Myeloma panel was negative and she was started on Vyndamax.  She presents today for follow-up accompanied by her daughter.  Since her last visit she has done well from a cardiac standpoint.  She denies any dyspnea, PND, orthopnea, edema, weight gain.  Denies dizziness, presyncope, syncope, denies symptoms concerning for angina.  Overall, she reports feeling  well denies any new concerns today.  Home Medications    Current Outpatient Medications  Medication Sig Dispense Refill   apixaban (ELIQUIS) 5 MG TABS tablet Take by mouth.     atorvastatin (LIPITOR) 80 MG tablet Take 80 mg by mouth daily.     brimonidine-timolol (COMBIGAN) 0.2-0.5 % ophthalmic solution Place 1 drop into both eyes every 12 (twelve) hours.     calcium carbonate (OSCAL) 1500 (600 Ca) MG TABS tablet Take 1,500 mg by mouth 2 (two) times daily with a meal.      diltiazem (CARDIZEM CD) 240 MG 24 hr capsule Take 1 capsule (240 mg total) by mouth daily. 90 capsule 3   dorzolamide (TRUSOPT) 2 % ophthalmic solution Place 1 drop into both eyes daily.     hydrochlorothiazide (MICROZIDE) 12.5 MG capsule Take 12.5 mg by mouth daily.     latanoprost (XALATAN) 0.005 % ophthalmic solution Place 1 drop into both eyes at bedtime.     lisinopril (ZESTRIL) 20 MG tablet Take 20 mg by mouth daily.     [START ON 08/09/2022] memantine (NAMENDA) 10 MG tablet Take 1 tablet (10 mg total) by mouth 2 (two) times daily. 60 tablet 5   metFORMIN (GLUCOPHAGE) 500 MG tablet Take 1,000 mg by mouth daily.     prednisoLONE acetate (PRED FORTE) 1 % ophthalmic suspension Place 1 drop into the right eye 3 (three) times daily.     Tafamidis (VYNDAMAX) 61 MG CAPS Take 61 mg by mouth daily. 30 capsule 12   vitamin B-12 (CYANOCOBALAMIN) 1000 MCG tablet Take 1,000 mcg by mouth daily.     vitamin C (ASCORBIC ACID) 500 MG tablet Take 500 mg by mouth daily.     memantine (NAMENDA) 5 MG tablet Start '5mg'$  qhs 7 days, increase to '5mg'$  BID for 7 days, increase to '5mg'$  AM and '10mg'$  PM for 7 days then increase to '10mg'$  twice daily (Patient not taking: Reported on 07/24/2022) 49 tablet 0   No current facility-administered medications for this visit.     Review of Systems    She denies chest pain, palpitations, dyspnea, pnd, orthopnea, n, v, dizziness, syncope, edema, weight gain, or early satiety. All other systems reviewed and are otherwise negative except as noted above.   Physical Exam    VS:  BP 126/74   Pulse 83   Ht '5\' 3"'$  (1.6 m)   Wt 157 lb (71.2 kg)   SpO2 99%   BMI 27.81 kg/m  GEN: Well nourished, well developed, in no acute distress. HEENT: normal. Neck: Supple, no JVD, carotid bruits, or masses. Cardiac: RRR, no murmurs, rubs, or gallops. No clubbing, cyanosis, edema.  Radials/DP/PT 2+ and equal bilaterally.  Respiratory:  Respirations regular and unlabored, clear to auscultation  bilaterally. GI: Soft, nontender, nondistended, BS + x 4. MS: no deformity or atrophy. Skin: warm and dry, no rash. Neuro:  Strength and sensation are intact. Psych: Normal affect.  Accessory Clinical Findings    ECG personally reviewed by me today -  - no acute changes.  Lab Results  Component Value Date   WBC 12.0 (H) 05/11/2021   HGB 9.9 (L) 05/11/2021   HCT 29.9 (L) 05/11/2021   MCV 89.8 05/11/2021   PLT 223 05/11/2021   Lab Results  Component Value Date   CREATININE 0.77 05/11/2021   BUN 17 05/11/2021   NA 139 05/11/2021   K 4.0 05/11/2021   CL 107 05/11/2021   CO2 25 05/11/2021   Lab Results  Component  Value Date   ALT 11 04/15/2021   AST 15 04/15/2021   ALKPHOS 39 04/15/2021   BILITOT 0.7 04/15/2021   No results found for: "CHOL", "HDL", "LDLCALC", "LDLDIRECT", "TRIG", "CHOLHDL"  Lab Results  Component Value Date   HGBA1C 6.8 (H) 04/14/2021    Assessment & Plan    1. Wild-type transthyretin related amyloidosis: Echo in 03/2021 showed severe LVH, mild left atrial dilation, mitral inflow was consistent with impaired relaxation, probable elevated filling pressures.  Follow-up PYP scan was highly suggestive of cardiac amyloidosis.  Myeloma panel was negative. Euvolemic and well compensated on exam. Tolerating tafamidis.  Continue tafamidis, lisinopril, hydrochlorothiazide.  2. Paroxysmal atrial fibrillation: Maintaining sinus rhythm.  Continue Eliquis, diltiazem.  3. Type 2 diabetes: A1C was 6.6 in 02/2022.  Monitored and managed per PCP.  4. Disposition: Follow-up in 6 months.   Lenna Sciara, NP 07/24/2022, 4:43 PM

## 2022-07-24 NOTE — Patient Instructions (Signed)
Medication Instructions:  Your physician recommends that you continue on your current medications as directed. Please refer to the Current Medication list given to you today.   *If you need a refill on your cardiac medications before your next appointment, please call your pharmacy*   Lab Work: NONE ordered at this time of appointment   If you have labs (blood work) drawn today and your tests are completely normal, you will receive your results only by: [place] (if you have MyChart) OR A paper copy in the mail If you have any lab test that is abnormal or we need to change your treatment, we will call you to review the results.   Testing/Procedures: NONE ordered at this time of appointment     Follow-Up: At [facility], you and your health needs are our priority.  As part of our continuing mission to provide you with exceptional heart care, we have created designated Provider Care Teams.  These Care Teams include your primary Cardiologist (physician) and Advanced Practice Providers (APPs -  Physician Assistants and Nurse Practitioners) who all work together to provide you with the care you need, when you need it.  We recommend signing up for the patient portal called "MyChart".  Sign up information is provided on this After Visit Summary.  MyChart is used to connect with patients for Virtual Visits (Telemedicine).  Patients are able to view lab/test results, encounter notes, upcoming appointments, etc.  Non-urgent messages can be sent to your provider as well.   To learn more about what you can do with MyChart, go to [email or web address].    Your next appointment:   6 month(s)  The format for your next appointment:   In Person  Provider:   Sanda Klein, MD     Other Instructions   Important Information About Sugar

## 2022-07-25 NOTE — Addendum Note (Signed)
Addended by: Deanna Artis A on: 07/25/2022 09:04 AM   Modules accepted: Orders

## 2022-08-15 DIAGNOSIS — R413 Other amnesia: Secondary | ICD-10-CM | POA: Diagnosis not present

## 2022-08-15 DIAGNOSIS — M792 Neuralgia and neuritis, unspecified: Secondary | ICD-10-CM | POA: Diagnosis not present

## 2022-09-14 DIAGNOSIS — I10 Essential (primary) hypertension: Secondary | ICD-10-CM | POA: Diagnosis not present

## 2022-09-14 DIAGNOSIS — E1169 Type 2 diabetes mellitus with other specified complication: Secondary | ICD-10-CM | POA: Diagnosis not present

## 2022-09-14 DIAGNOSIS — R413 Other amnesia: Secondary | ICD-10-CM | POA: Diagnosis not present

## 2022-09-14 DIAGNOSIS — E7800 Pure hypercholesterolemia, unspecified: Secondary | ICD-10-CM | POA: Diagnosis not present

## 2022-09-14 DIAGNOSIS — I4891 Unspecified atrial fibrillation: Secondary | ICD-10-CM | POA: Diagnosis not present

## 2022-09-14 DIAGNOSIS — M792 Neuralgia and neuritis, unspecified: Secondary | ICD-10-CM | POA: Diagnosis not present

## 2022-09-14 DIAGNOSIS — D649 Anemia, unspecified: Secondary | ICD-10-CM | POA: Diagnosis not present

## 2022-09-14 DIAGNOSIS — Z23 Encounter for immunization: Secondary | ICD-10-CM | POA: Diagnosis not present

## 2022-09-19 ENCOUNTER — Ambulatory Visit: Payer: Medicare PPO | Admitting: Adult Health

## 2022-11-05 DIAGNOSIS — Z961 Presence of intraocular lens: Secondary | ICD-10-CM | POA: Diagnosis not present

## 2022-11-05 DIAGNOSIS — E119 Type 2 diabetes mellitus without complications: Secondary | ICD-10-CM | POA: Diagnosis not present

## 2022-11-05 DIAGNOSIS — Z9889 Other specified postprocedural states: Secondary | ICD-10-CM | POA: Diagnosis not present

## 2022-11-05 DIAGNOSIS — H401113 Primary open-angle glaucoma, right eye, severe stage: Secondary | ICD-10-CM | POA: Diagnosis not present

## 2022-11-05 DIAGNOSIS — H401122 Primary open-angle glaucoma, left eye, moderate stage: Secondary | ICD-10-CM | POA: Diagnosis not present

## 2022-11-19 DIAGNOSIS — E119 Type 2 diabetes mellitus without complications: Secondary | ICD-10-CM | POA: Diagnosis not present

## 2022-11-19 DIAGNOSIS — H401113 Primary open-angle glaucoma, right eye, severe stage: Secondary | ICD-10-CM | POA: Diagnosis not present

## 2022-11-19 DIAGNOSIS — H401122 Primary open-angle glaucoma, left eye, moderate stage: Secondary | ICD-10-CM | POA: Diagnosis not present

## 2022-11-19 DIAGNOSIS — Z961 Presence of intraocular lens: Secondary | ICD-10-CM | POA: Diagnosis not present

## 2023-01-01 DIAGNOSIS — Z961 Presence of intraocular lens: Secondary | ICD-10-CM | POA: Diagnosis not present

## 2023-01-01 DIAGNOSIS — H401122 Primary open-angle glaucoma, left eye, moderate stage: Secondary | ICD-10-CM | POA: Diagnosis not present

## 2023-01-01 DIAGNOSIS — E119 Type 2 diabetes mellitus without complications: Secondary | ICD-10-CM | POA: Diagnosis not present

## 2023-01-01 DIAGNOSIS — H401113 Primary open-angle glaucoma, right eye, severe stage: Secondary | ICD-10-CM | POA: Diagnosis not present

## 2023-01-09 DIAGNOSIS — H10403 Unspecified chronic conjunctivitis, bilateral: Secondary | ICD-10-CM | POA: Diagnosis not present

## 2023-01-09 DIAGNOSIS — H5789 Other specified disorders of eye and adnexa: Secondary | ICD-10-CM | POA: Diagnosis not present

## 2023-01-09 DIAGNOSIS — H5712 Ocular pain, left eye: Secondary | ICD-10-CM | POA: Diagnosis not present

## 2023-01-11 DIAGNOSIS — E7800 Pure hypercholesterolemia, unspecified: Secondary | ICD-10-CM | POA: Diagnosis not present

## 2023-01-11 DIAGNOSIS — E1169 Type 2 diabetes mellitus with other specified complication: Secondary | ICD-10-CM | POA: Diagnosis not present

## 2023-01-11 DIAGNOSIS — I10 Essential (primary) hypertension: Secondary | ICD-10-CM | POA: Diagnosis not present
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
## 2023-01-15 ENCOUNTER — Encounter: Payer: Self-pay | Admitting: Adult Health

## 2023-01-15 ENCOUNTER — Other Ambulatory Visit: Payer: Self-pay | Admitting: Adult Health

## 2023-01-15 ENCOUNTER — Ambulatory Visit (INDEPENDENT_AMBULATORY_CARE_PROVIDER_SITE_OTHER): Payer: Medicare PPO | Admitting: Adult Health

## 2023-01-15 ENCOUNTER — Telehealth: Payer: Self-pay | Admitting: Adult Health

## 2023-01-15 ENCOUNTER — Other Ambulatory Visit: Payer: Self-pay

## 2023-01-15 VITALS — BP 153/89 | HR 95 | Ht 63.0 in | Wt 157.6 lb

## 2023-01-15 DIAGNOSIS — I6381 Other cerebral infarction due to occlusion or stenosis of small artery: Secondary | ICD-10-CM

## 2023-01-15 DIAGNOSIS — M48062 Spinal stenosis, lumbar region with neurogenic claudication: Secondary | ICD-10-CM | POA: Diagnosis not present

## 2023-01-15 DIAGNOSIS — R4189 Other symptoms and signs involving cognitive functions and awareness: Secondary | ICD-10-CM

## 2023-01-15 DIAGNOSIS — R269 Unspecified abnormalities of gait and mobility: Secondary | ICD-10-CM

## 2023-01-15 MED ORDER — MEMANTINE HCL 10 MG PO TABS: 10.0000 mg | ORAL_TABLET | Freq: Two times a day (BID) | ORAL | 3 refills | Status: DC

## 2023-01-15 MED ORDER — DULOXETINE HCL 30 MG PO CSDR: 30.0000 mg | DELAYED_RELEASE_CAPSULE | Freq: Every day | ORAL | 5 refills | Status: DC

## 2023-01-15 NOTE — Telephone Encounter (Signed)
Rx sent 

## 2023-01-15 NOTE — Telephone Encounter (Signed)
Pt daughter is calling. Stated pt refill forDULoxetine HCl 30 MG CSDR   was sent to wrong pharmacy. She is asking for the prescription to be sent to [facility]

## 2023-01-15 NOTE — Patient Instructions (Addendum)
Recommend trying duloxetine (Cymbalta) '30mg'$  daily, after 1-2 weeks if tolerating okay but not beneficial, please let me know for dosage increase or sooner if any difficulty tolerating   Will follow up with Dr. Krista Blue regarding possible benefit with use of injections for continued lower back pain   Continue Namenda '10mg'$  twice daily for memory      Follow up with Dr. Krista Blue in 3-4 months or call earlier if needed    Duloxetine Delayed-Release Capsules What is this medication? DULOXETINE (doo LOX e teen) treats depression, anxiety, fibromyalgia, and certain types of chronic pain such as nerve, bone, or joint pain. It increases the amount of serotonin and norepinephrine in the brain, hormones that help regulate mood and pain. It belongs to a group of medications called SNRIs. This medicine may be used for other purposes; ask your health care provider or pharmacist if you have questions. COMMON BRAND NAME(S): Cymbalta, Drizalma, Irenka What should I tell my care team before I take this medication? They need to know if you have any of these conditions: Bipolar disorder Glaucoma High blood pressure Kidney disease Liver disease Seizures Suicidal thoughts, plans or attempt; a previous suicide attempt by you or a family member Take medications that treat or prevent blood clots Taken medications called MAOIs like Carbex, Eldepryl, Marplan, Nardil, and Parnate within 14 days Trouble passing urine An unusual reaction to duloxetine, other medications, foods, dyes, or preservatives Pregnant or trying to get pregnant Breast-feeding How should I use this medication? Take this medication by mouth with a glass of water. Follow the directions on the prescription label. Do not crush, cut or chew some capsules of this medication. Some capsules may be opened and sprinkled on applesauce. Check with your care team or pharmacist if you are not sure. You can take this medication with or without food. Take your  medication at regular intervals. Do not take your medication more often than directed. Do not stop taking this medication suddenly except upon the advice of your care team. Stopping this medication too quickly may cause serious side effects or your condition may worsen. A special MedGuide will be given to you by the pharmacist with each prescription and refill. Be sure to read this information carefully each time. Talk to your care team regarding the use of this medication in children. While this medication may be prescribed for children as young as 32 years of age for selected conditions, precautions do apply. Overdosage: If you think you have taken too much of this medicine contact a poison control center or emergency room at once. NOTE: This medicine is only for you. Do not share this medicine with others. What if I miss a dose? If you miss a dose, take it as soon as you can. If it is almost time for your next dose, take only that dose. Do not take double or extra doses. What may interact with this medication? Do not take this medication with any of the following: Desvenlafaxine Levomilnacipran Linezolid MAOIs like Carbex, Eldepryl, Emsam, Marplan, Nardil, and Parnate Methylene blue (injected into a vein) Milnacipran Safinamide Thioridazine Venlafaxine Viloxazine This medication may also interact with the following: Alcohol Amphetamines Aspirin and aspirin-like medications Certain antibiotics like ciprofloxacin and enoxacin Certain medications for blood pressure, heart disease, irregular heart beat Certain medications for depression, anxiety, or psychotic disturbances Certain medications for migraine headache like almotriptan, eletriptan, frovatriptan, naratriptan, rizatriptan, sumatriptan, zolmitriptan Certain medications that treat or prevent blood clots like warfarin, enoxaparin, and dalteparin Cimetidine Fentanyl Lithium  NSAIDS, medications for pain and inflammation, like  ibuprofen or naproxen Phentermine Procarbazine Rasagiline Sibutramine St. John's wort Theophylline Tramadol Tryptophan This list may not describe all possible interactions. Give your health care provider a list of all the medicines, herbs, non-prescription drugs, or dietary supplements you use. Also tell them if you smoke, drink alcohol, or use illegal drugs. Some items may interact with your medicine. What should I watch for while using this medication? Tell your care team if your symptoms do not get better or if they get worse. Visit your care team for regular checks on your progress. Because it may take several weeks to see the full effects of this medication, it is important to continue your treatment as prescribed by your care team. This medication may cause serious skin reactions. They can happen weeks to months after starting the medication. Contact your care team right away if you notice fevers or flu-like symptoms with a rash. The rash may be red or purple and then turn into blisters or peeling of the skin. Or, you might notice a red rash with swelling of the face, lips, or lymph nodes in your neck or under your arms. Watch for new or worsening thoughts of suicide or depression. This includes sudden changes in mood, behaviors, or thoughts. These changes can happen at any time but are more common in the beginning of treatment or after a change in dose. Call your care team right away if you experience these thoughts or worsening depression. Manic episodes may happen in patients with bipolar disorder who take this medication. Watch for changes in feelings or behaviors such as feeling anxious, nervous, agitated, panicky, irritable, hostile, aggressive, impulsive, severely restless, overly excited and hyperactive, or trouble sleeping. These symptoms can happen at any time, but are more common in the beginning of treatment or after a change in dose. Call your care team right away if you notice any  of these symptoms. You may get drowsy or dizzy. Do not drive, use machinery, or do anything that needs mental alertness until you know how this medication affects you. Do not stand or sit up quickly, especially if you are an older patient. This reduces the risk of dizzy or fainting spells. Alcohol may interfere with the effect of this medication. Avoid alcoholic drinks. This medication may increase blood sugar. The risk may be higher in patients who already have diabetes. Ask your care team what you can do to lower your risk of diabetes while taking this medication. This medication can cause an increase in blood pressure. This medication can also cause a sudden drop in your blood pressure, which may make you feel faint and increase the chance of a fall. These effects are most common when you first start the medication or when the dose is increased, or during use of other medications that can cause a sudden drop in blood pressure. Check with your care team for instructions on monitoring your blood pressure while taking this medication. Your mouth may get dry. Chewing sugarless gum or sucking hard candy, and drinking plenty of water, may help. Contact your care team if the problem does not go away or is severe. What side effects may I notice from receiving this medication? Side effects that you should report to your care team as soon as possible: Allergic reactions--skin rash, itching, hives, swelling of the face, lips, tongue, or throat Bleeding--bloody or black, tar-like stools, red or dark brown urine, vomiting blood or brown material that looks like coffee  grounds, small, red or purple spots on skin, unusual bleeding or bruising Increase in blood pressure Liver injury--right upper belly pain, loss of appetite, nausea, light-colored stool, dark yellow or brown urine, yellowing skin or eyes, unusual weakness or fatigue Low sodium level--muscle weakness, fatigue, dizziness, headache, confusion Redness,  blistering, peeling, or loosening of the skin, including inside the mouth Serotonin syndrome--irritability, confusion, fast or irregular heartbeat, muscle stiffness, twitching muscles, sweating, high fever, seizures, chills, vomiting, diarrhea Sudden eye pain or change in vision such as blurry vision, seeing halos around lights, vision loss Thoughts of suicide or self-harm, worsening mood, feelings of depression Trouble passing urine Side effects that usually do not require medical attention (report to your care team if they continue or are bothersome): Change in sex drive or performance Constipation Diarrhea Dizziness Dry mouth Excessive sweating Loss of appetite Nausea Vomiting This list may not describe all possible side effects. Call your doctor for medical advice about side effects. You may report side effects to FDA at 1-800-FDA-1088. Where should I keep my medication? Keep out of the reach of children and pets. Store at room temperature between 15 and 30 degrees C (59 to 86 degrees F). Get rid of any unused medication after the expiration date. To get rid of medications that are no longer needed or have expired: Take the medication to a medication take-back program. Check with your pharmacy or law enforcement to find a location. If you cannot return the medication, check the label or package insert to see if the medication should be thrown out in the garbage or flushed down the toilet. If you are not sure, ask your care team. If it is safe to put it in the trash, take the medication out of the container. Mix the medication with cat litter, dirt, coffee grounds, or other unwanted substance. Seal the mixture in a bag or container. Put it in the trash. NOTE: This sheet is a summary. It may not cover all possible information. If you have questions about this medicine, talk to your doctor, pharmacist, or health care provider.  2023 Elsevier/Gold Standard (2020-11-21 00:00:00)

## 2023-01-22 ENCOUNTER — Telehealth: Payer: Self-pay | Admitting: Adult Health

## 2023-01-22 NOTE — Telephone Encounter (Signed)
Called the daughter back. She has been taking the cymbalta since last Wednesday. Prior to the medication, pt would get up make her coffee and start he day. Since starting the She is very sluggish, out of it, just gets up and sits. It has caused a change in demeanor. She hasn't c/o of legs hurting her but she is just not her normal self and family can tell something is off.  She takes the medication in the morning. On Sunday evening she had an episode of increased confusion.  Advised the daughter, I would let Janett Billow, NP know what is going on and advised I would touch base after discussing her recommendation. Pt's daughter verbalized understanding.

## 2023-01-22 NOTE — Telephone Encounter (Signed)
Called the daugter back and advised that Janett Billow, NP does recommend that she stop the medication and see if that works itself out after the medication is out of her system. Daughter verbalized understanding and had no other questions at this time.

## 2023-01-22 NOTE — Telephone Encounter (Signed)
Pt's daughter, Inge Rise (on [place]) said my mother is having a reaction to DULoxetine HCl 30 MG CSDR . Having confusion, morning get up and sit like she does not know what's going on around her. Would like a call from the nurse

## 2023-01-22 NOTE — Telephone Encounter (Signed)
Recommend stopping medication to see if symptoms improve but if persists, would recommend f/u with PCP to evaluate for other contributing causes.

## 2023-01-24 DIAGNOSIS — H401113 Primary open-angle glaucoma, right eye, severe stage: Secondary | ICD-10-CM | POA: Diagnosis not present

## 2023-02-03 ENCOUNTER — Other Ambulatory Visit: Payer: Self-pay | Admitting: Adult Health

## 2023-02-03 DIAGNOSIS — R4189 Other symptoms and signs involving cognitive functions and awareness: Secondary | ICD-10-CM

## 2023-02-03 DIAGNOSIS — I6381 Other cerebral infarction due to occlusion or stenosis of small artery: Secondary | ICD-10-CM

## 2023-02-04 ENCOUNTER — Telehealth: Payer: Self-pay | Admitting: Adult Health

## 2023-02-04 DIAGNOSIS — I6381 Other cerebral infarction due to occlusion or stenosis of small artery: Secondary | ICD-10-CM

## 2023-02-04 DIAGNOSIS — R4189 Other symptoms and signs involving cognitive functions and awareness: Secondary | ICD-10-CM

## 2023-02-04 MED ORDER — MEMANTINE HCL 10 MG PO TABS: 10.0000 mg | ORAL_TABLET | Freq: Two times a day (BID) | ORAL | 3 refills | Status: DC

## 2023-02-04 NOTE — Telephone Encounter (Signed)
Pt daughter is calling stating she called [facility] and was told medication was denied. She said pt is two days from being out of medication. She is requesting a call back from nurse.

## 2023-02-04 NOTE — Telephone Encounter (Signed)
Contacted daughter back, Rx was previously sent to [facility] which is why it was denied due to rq too soon. Rx reordered to [facility].

## 2023-02-11 NOTE — Progress Notes (Signed)
Please see if patient is still interested in spinal injections for lower back pain? If so, order can be placed to neurosurgery to further discuss. Thank you!

## 2023-02-12 ENCOUNTER — Telehealth: Payer: Self-pay | Admitting: Neurology

## 2023-02-12 DIAGNOSIS — R269 Unspecified abnormalities of gait and mobility: Secondary | ICD-10-CM

## 2023-02-12 DIAGNOSIS — M545 Low back pain, unspecified: Secondary | ICD-10-CM

## 2023-02-12 DIAGNOSIS — M48062 Spinal stenosis, lumbar region with neurogenic claudication: Secondary | ICD-10-CM

## 2023-02-12 NOTE — Telephone Encounter (Signed)
  Please see if patient is still interested in spinal injections for lower back pain? If so, order can be placed to neurosurgery to further discuss.     Call the daughter and she would like to move forward with referral to NS to determine if Amanda Green would be candidate for spinal injections. I will place referral

## 2023-02-12 NOTE — Telephone Encounter (Signed)
-----   Message from Frann Rider, NP sent at 02/11/2023  4:33 PM EST -----    ----- Message ----- From: Marcial Pacas, MD Sent: 02/11/2023   4:31 PM EST To: Frann Rider, NP  If you think her right leg pain is related to her lumbar degenerative changes, right lumbar radiculopathy, she might benefit pain management, epidural injection, it is okay to refer   ----- Message ----- From: Frann Rider, NP Sent: 01/15/2023   1:13 PM EST To: Marcial Pacas, MD  Patient with continued RLE pain, starting on duloxetine today. Wondering if you think lumbar injections would be of any benefit for her? If so, I will place referral as appropriate. Please let me know! Thank you!

## 2023-02-13 ENCOUNTER — Other Ambulatory Visit: Payer: Self-pay

## 2023-02-13 ENCOUNTER — Emergency Department (HOSPITAL_COMMUNITY): Payer: Medicare PPO

## 2023-02-13 ENCOUNTER — Inpatient Hospital Stay (HOSPITAL_COMMUNITY)
Admission: EM | Admit: 2023-02-13 | Discharge: 2023-02-18 | DRG: 040 | Disposition: A | Discharge status: Home / Self Care | Payer: Medicare PPO | Attending: Internal Medicine | Admitting: Internal Medicine

## 2023-02-13 ENCOUNTER — Encounter (HOSPITAL_COMMUNITY): Payer: Self-pay

## 2023-02-13 ENCOUNTER — Telehealth: Payer: Self-pay | Admitting: Adult Health

## 2023-02-13 DIAGNOSIS — H53461 Homonymous bilateral field defects, right side: Secondary | ICD-10-CM | POA: Diagnosis present

## 2023-02-13 DIAGNOSIS — W19XXXA Unspecified fall, initial encounter: Secondary | ICD-10-CM | POA: Diagnosis present

## 2023-02-13 DIAGNOSIS — C50912 Malignant neoplasm of unspecified site of left female breast: Secondary | ICD-10-CM | POA: Diagnosis not present

## 2023-02-13 DIAGNOSIS — Z7901 Long term (current) use of anticoagulants: Secondary | ICD-10-CM

## 2023-02-13 DIAGNOSIS — G936 Cerebral edema: Secondary | ICD-10-CM | POA: Diagnosis present

## 2023-02-13 DIAGNOSIS — E278 Other specified disorders of adrenal gland: Secondary | ICD-10-CM | POA: Diagnosis present

## 2023-02-13 DIAGNOSIS — Z96652 Presence of left artificial knee joint: Secondary | ICD-10-CM | POA: Diagnosis present

## 2023-02-13 DIAGNOSIS — R413 Other amnesia: Secondary | ICD-10-CM | POA: Diagnosis present

## 2023-02-13 DIAGNOSIS — E119 Type 2 diabetes mellitus without complications: Secondary | ICD-10-CM | POA: Diagnosis present

## 2023-02-13 DIAGNOSIS — Z853 Personal history of malignant neoplasm of breast: Secondary | ICD-10-CM | POA: Diagnosis not present

## 2023-02-13 DIAGNOSIS — I43 Cardiomyopathy in diseases classified elsewhere: Secondary | ICD-10-CM | POA: Diagnosis present

## 2023-02-13 DIAGNOSIS — I4891 Unspecified atrial fibrillation: Secondary | ICD-10-CM | POA: Diagnosis not present

## 2023-02-13 DIAGNOSIS — R42 Dizziness and giddiness: Secondary | ICD-10-CM | POA: Diagnosis not present

## 2023-02-13 DIAGNOSIS — K429 Umbilical hernia without obstruction or gangrene: Secondary | ICD-10-CM | POA: Diagnosis present

## 2023-02-13 DIAGNOSIS — K5790 Diverticulosis of intestine, part unspecified, without perforation or abscess without bleeding: Secondary | ICD-10-CM | POA: Diagnosis not present

## 2023-02-13 DIAGNOSIS — I700 Atherosclerosis of aorta: Secondary | ICD-10-CM | POA: Diagnosis present

## 2023-02-13 DIAGNOSIS — I480 Paroxysmal atrial fibrillation: Secondary | ICD-10-CM | POA: Diagnosis present

## 2023-02-13 DIAGNOSIS — I10 Essential (primary) hypertension: Secondary | ICD-10-CM | POA: Diagnosis present

## 2023-02-13 DIAGNOSIS — Z888 Allergy status to other drugs, medicaments and biological substances status: Secondary | ICD-10-CM

## 2023-02-13 DIAGNOSIS — Z9013 Acquired absence of bilateral breasts and nipples: Secondary | ICD-10-CM

## 2023-02-13 DIAGNOSIS — M47816 Spondylosis without myelopathy or radiculopathy, lumbar region: Secondary | ICD-10-CM | POA: Diagnosis present

## 2023-02-13 DIAGNOSIS — Z7984 Long term (current) use of oral hypoglycemic drugs: Secondary | ICD-10-CM

## 2023-02-13 DIAGNOSIS — G9389 Other specified disorders of brain: Secondary | ICD-10-CM | POA: Diagnosis not present

## 2023-02-13 DIAGNOSIS — R2232 Localized swelling, mass and lump, left upper limb: Secondary | ICD-10-CM | POA: Diagnosis not present

## 2023-02-13 DIAGNOSIS — J9811 Atelectasis: Secondary | ICD-10-CM | POA: Diagnosis not present

## 2023-02-13 DIAGNOSIS — R296 Repeated falls: Secondary | ICD-10-CM | POA: Diagnosis not present

## 2023-02-13 DIAGNOSIS — C7931 Secondary malignant neoplasm of brain: Principal | ICD-10-CM | POA: Diagnosis present

## 2023-02-13 DIAGNOSIS — F0390 Unspecified dementia without behavioral disturbance: Secondary | ICD-10-CM | POA: Diagnosis present

## 2023-02-13 DIAGNOSIS — E785 Hyperlipidemia, unspecified: Secondary | ICD-10-CM | POA: Diagnosis present

## 2023-02-13 DIAGNOSIS — Z8673 Personal history of transient ischemic attack (TIA), and cerebral infarction without residual deficits: Secondary | ICD-10-CM

## 2023-02-13 DIAGNOSIS — Z79899 Other long term (current) drug therapy: Secondary | ICD-10-CM

## 2023-02-13 DIAGNOSIS — G319 Degenerative disease of nervous system, unspecified: Secondary | ICD-10-CM | POA: Diagnosis not present

## 2023-02-13 DIAGNOSIS — D496 Neoplasm of unspecified behavior of brain: Secondary | ICD-10-CM | POA: Diagnosis present

## 2023-02-13 DIAGNOSIS — Z7189 Other specified counseling: Secondary | ICD-10-CM | POA: Diagnosis not present

## 2023-02-13 DIAGNOSIS — I2510 Atherosclerotic heart disease of native coronary artery without angina pectoris: Secondary | ICD-10-CM | POA: Diagnosis present

## 2023-02-13 DIAGNOSIS — Z515 Encounter for palliative care: Secondary | ICD-10-CM

## 2023-02-13 DIAGNOSIS — D0512 Intraductal carcinoma in situ of left breast: Secondary | ICD-10-CM | POA: Diagnosis present

## 2023-02-13 DIAGNOSIS — E854 Organ-limited amyloidosis: Secondary | ICD-10-CM | POA: Diagnosis present

## 2023-02-13 DIAGNOSIS — R4189 Other symptoms and signs involving cognitive functions and awareness: Secondary | ICD-10-CM | POA: Diagnosis present

## 2023-02-13 DIAGNOSIS — C50919 Malignant neoplasm of unspecified site of unspecified female breast: Secondary | ICD-10-CM | POA: Diagnosis present

## 2023-02-13 DIAGNOSIS — Z1501 Genetic susceptibility to malignant neoplasm of breast: Secondary | ICD-10-CM

## 2023-02-13 DIAGNOSIS — C773 Secondary and unspecified malignant neoplasm of axilla and upper limb lymph nodes: Secondary | ICD-10-CM | POA: Diagnosis not present

## 2023-02-13 DIAGNOSIS — Z90710 Acquired absence of both cervix and uterus: Secondary | ICD-10-CM

## 2023-02-13 DIAGNOSIS — R531 Weakness: Secondary | ICD-10-CM | POA: Diagnosis not present

## 2023-02-13 DIAGNOSIS — R222 Localized swelling, mass and lump, trunk: Secondary | ICD-10-CM | POA: Diagnosis present

## 2023-02-13 DIAGNOSIS — K449 Diaphragmatic hernia without obstruction or gangrene: Secondary | ICD-10-CM | POA: Diagnosis present

## 2023-02-13 DIAGNOSIS — N6320 Unspecified lump in the left breast, unspecified quadrant: Secondary | ICD-10-CM

## 2023-02-13 LAB — CBC
HCT: 43.2 % (ref 36.0–46.0)
Hemoglobin: 13.7 g/dL (ref 12.0–15.0)
MCH: 28.9 pg (ref 26.0–34.0)
MCHC: 31.7 g/dL (ref 30.0–36.0)
MCV: 91.1 fL (ref 80.0–100.0)
Platelets: 203 10*3/uL (ref 150–400)
RBC: 4.74 MIL/uL (ref 3.87–5.11)
RDW: 16.6 % — ABNORMAL HIGH (ref 11.5–15.5)
WBC: 6.8 10*3/uL (ref 4.0–10.5)
nRBC: 0 % (ref 0.0–0.2)

## 2023-02-13 LAB — BASIC METABOLIC PANEL
Anion gap: 9 (ref 5–15)
BUN: 12 mg/dL (ref 8–23)
CO2: 25 mmol/L (ref 22–32)
Calcium: 10 mg/dL (ref 8.9–10.3)
Chloride: 103 mmol/L (ref 98–111)
Creatinine, Ser: 0.92 mg/dL (ref 0.44–1.00)
GFR, Estimated: >60 mL/min (ref >60)
Glucose, Bld: 170 mg/dL — ABNORMAL HIGH (ref 70–99)
Potassium: 3.5 mmol/L (ref 3.5–5.1)
Sodium: 137 mmol/L (ref 135–145)

## 2023-02-13 MED ORDER — GADOBUTROL 1 MMOL/ML IV SOLN
7.0000 mL | Freq: Once | INTRAVENOUS | Status: AC | PRN
  Administered 2023-02-13: 7 mL via INTRAVENOUS

## 2023-02-13 NOTE — ED Triage Notes (Signed)
Pt reports having 2 falls today, hitting the back of her head. Pt stays alone and reports being on blood thinners. Pt states that she just feels dizzy. No obvious deformities to the back of her head.

## 2023-02-13 NOTE — Telephone Encounter (Signed)
Referral for neurosurgery fax to [facility]. Phone: [phone], Fax: [phone].

## 2023-02-13 NOTE — ED Provider Notes (Signed)
[facility] Provider Note   CSN: [id number] Arrival date & time: 02/13/23  2113     History  Chief Complaint  Patient presents with   [facility] Amanda Green is a 88 y.o. female.   Fall  Patient presents after a fall.  Reportedly fell backwards hitting her head.  Lives by herself.  States she does feel little dizzy.  Unsure if this started before or after.  States she just feels a little off.  Denies room spinning.  Denies any real pain at this time.  He is however on anticoagulation.    Past Medical History:  Diagnosis Date   Arthritis    knees,   Breast cancer ([place]) 1999   right mastectomy   Breast cancer, left ([place]) 05/2019   left lumpectomy   Diabetes mellitus    Dislocation of left shoulder joint    Family history of prostate cancer    Hyperlipidemia    Hypertension    Memory loss    Numbness and tingling of right lower extremity    Paresthesia    Stroke ([place])    light stroke - 2012 ,right leg nerve damage     Home Medications Prior to Admission medications   Medication Sig Start Date End Date Taking? Authorizing Provider  apixaban (ELIQUIS) 5 MG TABS tablet Take by mouth. 06/23/21   [provider]  atorvastatin (LIPITOR) 80 MG tablet Take 80 mg by mouth daily.    [provider]  brimonidine-timolol (COMBIGAN) 0.2-0.5 % ophthalmic solution Place 1 drop into both eyes every 12 (twelve) hours.    [provider]  calcium carbonate (OSCAL) 1500 (600 Ca) MG TABS tablet Take 1,500 mg by mouth 2 (two) times daily with a meal.    [provider]  diltiazem (CARDIZEM CD) 240 MG 24 hr capsule Take 1 capsule (240 mg total) by mouth daily. 04/27/21   Kroeger, Lorelee Cover., PA-C  dorzolamide (TRUSOPT) 2 % ophthalmic solution Place 1 drop into both eyes daily. 10/19/20   [provider]  DULoxetine HCl 30 MG CSDR Take 30 mg by mouth daily. 01/15/23   Frann Rider, NP   hydrochlorothiazide (MICROZIDE) 12.5 MG capsule Take 12.5 mg by mouth daily.    [provider]  latanoprost (XALATAN) 0.005 % ophthalmic solution Place 1 drop into both eyes at bedtime. 11/13/15   [provider]  lisinopril (ZESTRIL) 20 MG tablet Take 20 mg by mouth daily.    [provider]  memantine (NAMENDA) 10 MG tablet Take 1 tablet (10 mg total) by mouth 2 (two) times daily. 02/04/23   Frann Rider, NP  metFORMIN (GLUCOPHAGE) 500 MG tablet Take 1,000 mg by mouth daily. 08/18/11   [provider]  prednisoLONE acetate (PRED FORTE) 1 % ophthalmic suspension Place 1 drop into the right eye 3 (three) times daily. 07/18/22   [provider]  Tafamidis (VYNDAMAX) 61 MG CAPS Take 61 mg by mouth daily. 04/27/22   Croitoru, Mihai, MD  vitamin B-12 (CYANOCOBALAMIN) 1000 MCG tablet Take 1,000 mcg by mouth daily.    [provider]  vitamin C (ASCORBIC ACID) 500 MG tablet Take 500 mg by mouth daily.    [provider]      Allergies    Gabapentin    Review of Systems   Review of Systems  Physical Exam Updated Vital Signs BP (!) 152/86   Pulse 81   Temp 98 F (36.7  C) (Oral)   Resp 16   Ht '5\' 3"'$  (1.6 m)   Wt 71.5 kg   SpO2 96%   BMI 27.92 kg/m  Physical Exam Vitals and nursing note reviewed.  HENT:     Head: Normocephalic and atraumatic.  Eyes:     Comments: Pupils somewhat constricted.  Eye movements intact.  Cardiovascular:     Rate and Rhythm: Normal rate.  Chest:     Chest wall: No tenderness.  Abdominal:     Tenderness: There is no abdominal tenderness.  Musculoskeletal:        General: No tenderness.  Skin:    General: Skin is warm.  Neurological:     Mental Status: She is oriented to person, place, and time.     ED Results / Procedures / Treatments   Labs (all labs ordered are listed, but only abnormal results are displayed) Labs Reviewed  BASIC METABOLIC PANEL - Abnormal; Notable for the following  components:      Result Value   Glucose, Bld 170 (*)    All other components within normal limits  CBC - Abnormal; Notable for the following components:   RDW 16.6 (*)    All other components within normal limits    EKG EKG Interpretation  Date/Time:  Wednesday February 13 2023 21:31:20 EST Ventricular Rate:  75 PR Interval:  176 QRS Duration: 81 QT Interval:  388 QTC Calculation: 434 R Axis:   22 Text Interpretation: Sinus rhythm Anterior infarct, old Nonspecific T abnormalities, lateral leads Confirmed by Davonna Belling [phone]) on 02/13/2023 10:01:54 PM  Radiology CT HEAD WO CONTRAST (5MM)  Result Date: 02/13/2023 CLINICAL DATA:  Trauma. EXAM: CT HEAD WITHOUT CONTRAST TECHNIQUE: Contiguous axial images were obtained from the base of the skull through the vertex without intravenous contrast. RADIATION DOSE REDUCTION: This exam was performed according to the departmental dose-optimization program which includes automated exposure control, adjustment of the mA and/or kV according to patient size and/or use of iterative reconstruction technique. COMPARISON:  Head CT 04/29/2021.  MRI brain 02/20/2021. FINDINGS: Brain: There is new vasogenic edema in the left frontoparietal and temporal region with associated sulcal effacement. There is 4 mm of midline shift to the right. There is no acute intracranial hemorrhage or extra-axial fluid collection. There is some mass effect on the left lateral ventricle. There is no hydrocephalus. Vascular: Atherosclerotic calcifications are present within the cavernous internal carotid arteries. Skull: Normal. Negative for fracture or focal lesion. Sinuses/Orbits: No acute finding. Other: None. IMPRESSION: New vasogenic edema in the left frontoparietal and temporal lobes with associated sulcal effacement and 4 mm of midline shift to the right. Findings are concerning for underlying mass. Contrast-enhanced MRI is recommended for further evaluation. Electronically  Signed   By: Ronney Asters M.D.   On: 02/13/2023 22:06    Procedures Procedures    Medications Ordered in ED Medications - No data to display  ED Course/ Medical Decision Making/ A&P                             Medical Decision Making Amount and/or Complexity of Data Reviewed Labs: ordered. Radiology: ordered.   Patient with fall.  Reportedly struck back of her head.  Reportedly a little dizzy.  Unable to quantify dizziness.  Will get head CT and some basic blood work.  is 59 and lives alone.  Patient is a little slow to answer.  May be a little confused.  Patient CBC reassuring.  Unfortunately CT scan was independently interpreted and shows likely tumor on the left side.  Discussed with Dr. Ronnald Ramp.  If requires admission admit to internal medicine at [facility] and he will see her tomorrow.  Will get MRI here to further evaluate.  Care turned over to Dr Betsey Holiday          Final Clinical Impression(s) / ED Diagnoses Final diagnoses:  Fall, initial encounter    Rx / DC Orders ED Discharge Orders     None         Davonna Belling, MD 02/13/23 2242

## 2023-02-14 ENCOUNTER — Inpatient Hospital Stay (HOSPITAL_COMMUNITY): Payer: Medicare PPO

## 2023-02-14 ENCOUNTER — Other Ambulatory Visit: Payer: Self-pay | Admitting: Radiation Therapy

## 2023-02-14 ENCOUNTER — Observation Stay (HOSPITAL_COMMUNITY): Payer: Medicare PPO

## 2023-02-14 ENCOUNTER — Encounter (HOSPITAL_COMMUNITY): Payer: Self-pay | Admitting: Internal Medicine

## 2023-02-14 ENCOUNTER — Ambulatory Visit
Admit: 2023-02-14 | Discharge: 2023-02-14 | Disposition: A | Discharge status: Home / Self Care | Payer: BC Managed Care – PPO | Attending: Radiation Oncology | Admitting: Radiation Oncology

## 2023-02-14 DIAGNOSIS — I700 Atherosclerosis of aorta: Secondary | ICD-10-CM | POA: Diagnosis present

## 2023-02-14 DIAGNOSIS — M47816 Spondylosis without myelopathy or radiculopathy, lumbar region: Secondary | ICD-10-CM | POA: Diagnosis present

## 2023-02-14 DIAGNOSIS — E119 Type 2 diabetes mellitus without complications: Secondary | ICD-10-CM | POA: Diagnosis present

## 2023-02-14 DIAGNOSIS — D0512 Intraductal carcinoma in situ of left breast: Secondary | ICD-10-CM | POA: Diagnosis present

## 2023-02-14 DIAGNOSIS — K429 Umbilical hernia without obstruction or gangrene: Secondary | ICD-10-CM | POA: Diagnosis present

## 2023-02-14 DIAGNOSIS — I2510 Atherosclerotic heart disease of native coronary artery without angina pectoris: Secondary | ICD-10-CM | POA: Diagnosis present

## 2023-02-14 DIAGNOSIS — G936 Cerebral edema: Secondary | ICD-10-CM | POA: Diagnosis present

## 2023-02-14 DIAGNOSIS — I10 Essential (primary) hypertension: Secondary | ICD-10-CM

## 2023-02-14 DIAGNOSIS — E785 Hyperlipidemia, unspecified: Secondary | ICD-10-CM | POA: Diagnosis present

## 2023-02-14 DIAGNOSIS — R222 Localized swelling, mass and lump, trunk: Secondary | ICD-10-CM | POA: Diagnosis present

## 2023-02-14 DIAGNOSIS — J9811 Atelectasis: Secondary | ICD-10-CM | POA: Diagnosis not present

## 2023-02-14 DIAGNOSIS — K449 Diaphragmatic hernia without obstruction or gangrene: Secondary | ICD-10-CM | POA: Diagnosis present

## 2023-02-14 DIAGNOSIS — I480 Paroxysmal atrial fibrillation: Secondary | ICD-10-CM

## 2023-02-14 DIAGNOSIS — I43 Cardiomyopathy in diseases classified elsewhere: Secondary | ICD-10-CM | POA: Diagnosis present

## 2023-02-14 DIAGNOSIS — Z515 Encounter for palliative care: Secondary | ICD-10-CM | POA: Diagnosis not present

## 2023-02-14 DIAGNOSIS — Z79899 Other long term (current) drug therapy: Secondary | ICD-10-CM | POA: Diagnosis not present

## 2023-02-14 DIAGNOSIS — W19XXXA Unspecified fall, initial encounter: Secondary | ICD-10-CM | POA: Diagnosis present

## 2023-02-14 DIAGNOSIS — E278 Other specified disorders of adrenal gland: Secondary | ICD-10-CM | POA: Diagnosis present

## 2023-02-14 DIAGNOSIS — R296 Repeated falls: Secondary | ICD-10-CM | POA: Diagnosis not present

## 2023-02-14 DIAGNOSIS — D496 Neoplasm of unspecified behavior of brain: Secondary | ICD-10-CM | POA: Diagnosis present

## 2023-02-14 DIAGNOSIS — K5790 Diverticulosis of intestine, part unspecified, without perforation or abscess without bleeding: Secondary | ICD-10-CM | POA: Diagnosis not present

## 2023-02-14 DIAGNOSIS — C7931 Secondary malignant neoplasm of brain: Secondary | ICD-10-CM | POA: Diagnosis not present

## 2023-02-14 DIAGNOSIS — H53461 Homonymous bilateral field defects, right side: Secondary | ICD-10-CM | POA: Diagnosis present

## 2023-02-14 DIAGNOSIS — R4189 Other symptoms and signs involving cognitive functions and awareness: Secondary | ICD-10-CM

## 2023-02-14 DIAGNOSIS — R413 Other amnesia: Secondary | ICD-10-CM | POA: Diagnosis present

## 2023-02-14 DIAGNOSIS — Z853 Personal history of malignant neoplasm of breast: Secondary | ICD-10-CM | POA: Diagnosis not present

## 2023-02-14 DIAGNOSIS — E854 Organ-limited amyloidosis: Secondary | ICD-10-CM

## 2023-02-14 DIAGNOSIS — F0390 Unspecified dementia without behavioral disturbance: Secondary | ICD-10-CM | POA: Diagnosis present

## 2023-02-14 DIAGNOSIS — R531 Weakness: Secondary | ICD-10-CM | POA: Diagnosis not present

## 2023-02-14 DIAGNOSIS — Z96652 Presence of left artificial knee joint: Secondary | ICD-10-CM | POA: Diagnosis present

## 2023-02-14 LAB — CBG MONITORING, ED
Glucose-Capillary: 90 mg/dL (ref 70–99)
Glucose-Capillary: 102 mg/dL — ABNORMAL HIGH (ref 70–99)

## 2023-02-14 LAB — GLUCOSE, CAPILLARY
Glucose-Capillary: 174 mg/dL — ABNORMAL HIGH (ref 70–99)
Glucose-Capillary: 141 mg/dL — ABNORMAL HIGH (ref 70–99)
Glucose-Capillary: 131 mg/dL — ABNORMAL HIGH (ref 70–99)

## 2023-02-14 MED ORDER — MEMANTINE HCL 10 MG PO TABS
10.0000 mg | ORAL_TABLET | Freq: Two times a day (BID) | ORAL | Status: DC
  Administered 2023-02-14 – 2023-02-18 (×9): 10 mg via ORAL
  Filled 2023-02-14: qty 2
  Filled 2023-02-14 (×10): qty 1

## 2023-02-14 MED ORDER — DORZOLAMIDE HCL 2 % OP SOLN
1.0000 [drp] | Freq: Two times a day (BID) | OPHTHALMIC | Status: DC
  Administered 2023-02-14 – 2023-02-18 (×8): 1 [drp] via OPHTHALMIC
  Filled 2023-02-14: qty 10

## 2023-02-14 MED ORDER — ONDANSETRON HCL 4 MG/2ML IJ SOLN: 4.0000 mg | Freq: Four times a day (QID) | INTRAMUSCULAR | Status: DC | PRN

## 2023-02-14 MED ORDER — ACETAMINOPHEN 650 MG RE SUPP: 650.0000 mg | Freq: Four times a day (QID) | RECTAL | Status: DC | PRN

## 2023-02-14 MED ORDER — BRIMONIDINE TARTRATE-TIMOLOL 0.2-0.5 % OP SOLN
1.0000 [drp] | Freq: Two times a day (BID) | OPHTHALMIC | Status: DC
  Filled 2023-02-14 (×2): qty 5

## 2023-02-14 MED ORDER — PREDNISOLONE ACETATE 1 % OP SUSP
1.0000 [drp] | Freq: Four times a day (QID) | OPHTHALMIC | Status: DC
  Administered 2023-02-14 – 2023-02-18 (×16): 1 [drp] via OPHTHALMIC
  Filled 2023-02-14: qty 5

## 2023-02-14 MED ORDER — ACETAMINOPHEN 325 MG PO TABS
650.0000 mg | ORAL_TABLET | Freq: Four times a day (QID) | ORAL | Status: DC | PRN
  Administered 2023-02-15: 650 mg via ORAL
  Filled 2023-02-14: qty 2

## 2023-02-14 MED ORDER — SODIUM CHLORIDE (PF) 0.9 % IJ SOLN
INTRAMUSCULAR | Status: AC
  Filled 2023-02-14: qty 50

## 2023-02-14 MED ORDER — LATANOPROST 0.005 % OP SOLN
1.0000 [drp] | Freq: Every day | OPHTHALMIC | Status: DC
  Administered 2023-02-14: 1 [drp] via OPHTHALMIC
  Filled 2023-02-14: qty 2.5

## 2023-02-14 MED ORDER — HYDROCHLOROTHIAZIDE 12.5 MG PO TABS
12.5000 mg | ORAL_TABLET | Freq: Every day | ORAL | Status: DC
  Administered 2023-02-14 – 2023-02-18 (×5): 12.5 mg via ORAL
  Filled 2023-02-14 (×5): qty 1

## 2023-02-14 MED ORDER — MAGNESIUM 30 MG PO TABS: 30.0000 mg | ORAL_TABLET | Freq: Every day | ORAL | Status: DC

## 2023-02-14 MED ORDER — INSULIN ASPART 100 UNIT/ML IJ SOLN
0.0000 [IU] | Freq: Every day | INTRAMUSCULAR | Status: DC
  Administered 2023-02-16: 5 [IU] via SUBCUTANEOUS
  Administered 2023-02-17: 2 [IU] via SUBCUTANEOUS
  Filled 2023-02-14: qty 0.05

## 2023-02-14 MED ORDER — GADOBUTROL 1 MMOL/ML IV SOLN
7.5000 mL | Freq: Once | INTRAVENOUS | Status: AC | PRN
  Administered 2023-02-14: 7.5 mL via INTRAVENOUS

## 2023-02-14 MED ORDER — CALCIUM CARBONATE 1250 (500 CA) MG PO TABS
1250.0000 mg | ORAL_TABLET | Freq: Every day | ORAL | Status: DC
  Administered 2023-02-14 – 2023-02-18 (×5): 1250 mg via ORAL
  Filled 2023-02-14 (×5): qty 1

## 2023-02-14 MED ORDER — ONDANSETRON HCL 4 MG PO TABS: 4.0000 mg | ORAL_TABLET | Freq: Four times a day (QID) | ORAL | Status: DC | PRN

## 2023-02-14 MED ORDER — DILTIAZEM HCL ER BEADS 180 MG PO CP24: 180.0000 mg | ORAL_CAPSULE | Freq: Every day | ORAL | Status: DC

## 2023-02-14 MED ORDER — IOHEXOL 300 MG/ML  SOLN
100.0000 mL | Freq: Once | INTRAMUSCULAR | Status: AC | PRN
  Administered 2023-02-14: 100 mL via INTRAVENOUS

## 2023-02-14 MED ORDER — DILTIAZEM HCL ER COATED BEADS 180 MG PO CP24
180.0000 mg | ORAL_CAPSULE | Freq: Every day | ORAL | Status: DC
  Administered 2023-02-14 – 2023-02-18 (×5): 180 mg via ORAL
  Filled 2023-02-14 (×5): qty 1

## 2023-02-14 MED ORDER — DEXAMETHASONE 4 MG PO TABS
4.0000 mg | ORAL_TABLET | Freq: Four times a day (QID) | ORAL | Status: DC
  Administered 2023-02-14 – 2023-02-16 (×11): 4 mg via ORAL
  Filled 2023-02-14 (×18): qty 1

## 2023-02-14 MED ORDER — OFLOXACIN 0.3 % OP SOLN
1.0000 [drp] | Freq: Four times a day (QID) | OPHTHALMIC | Status: DC
  Administered 2023-02-14 – 2023-02-18 (×16): 1 [drp] via OPHTHALMIC
  Filled 2023-02-14 (×2): qty 5

## 2023-02-14 MED ORDER — TAFAMIDIS 61 MG PO CAPS
61.0000 mg | ORAL_CAPSULE | Freq: Every day | ORAL | Status: DC
  Administered 2023-02-16 – 2023-02-18 (×3): 61 mg via ORAL
  Filled 2023-02-14 (×15): qty 1

## 2023-02-14 MED ORDER — PREGABALIN 25 MG PO CAPS
75.0000 mg | ORAL_CAPSULE | Freq: Three times a day (TID) | ORAL | Status: DC
  Administered 2023-02-14 – 2023-02-18 (×14): 75 mg via ORAL
  Filled 2023-02-14 (×5): qty 3
  Filled 2023-02-14: qty 1
  Filled 2023-02-14 (×9): qty 3

## 2023-02-14 MED ORDER — INSULIN ASPART 100 UNIT/ML IJ SOLN
0.0000 [IU] | Freq: Three times a day (TID) | INTRAMUSCULAR | Status: DC
  Administered 2023-02-14: 3 [IU] via SUBCUTANEOUS
  Administered 2023-02-14: 2 [IU] via SUBCUTANEOUS
  Administered 2023-02-15: 8 [IU] via SUBCUTANEOUS
  Administered 2023-02-15 – 2023-02-16 (×2): 3 [IU] via SUBCUTANEOUS
  Administered 2023-02-16: 5 [IU] via SUBCUTANEOUS
  Administered 2023-02-16: 8 [IU] via SUBCUTANEOUS
  Administered 2023-02-17 (×2): 3 [IU] via SUBCUTANEOUS
  Administered 2023-02-17: 5 [IU] via SUBCUTANEOUS
  Administered 2023-02-18: 3 [IU] via SUBCUTANEOUS
  Administered 2023-02-18: 5 [IU] via SUBCUTANEOUS
  Filled 2023-02-14: qty 0.15

## 2023-02-14 MED ORDER — ATORVASTATIN CALCIUM 80 MG PO TABS
80.0000 mg | ORAL_TABLET | Freq: Every day | ORAL | Status: DC
  Administered 2023-02-14 – 2023-02-18 (×5): 80 mg via ORAL
  Filled 2023-02-14: qty 1
  Filled 2023-02-14: qty 2
  Filled 2023-02-14 (×3): qty 1

## 2023-02-14 MED ORDER — NETARSUDIL-LATANOPROST 0.02-0.005 % OP SOLN: 1.0000 [drp] | Freq: Every day | OPHTHALMIC | Status: DC

## 2023-02-14 NOTE — Assessment & Plan Note (Addendum)
Hold metformin Mod scale SSI AC/HS, may need to increase this to resistant scale due to decadron.

## 2023-02-14 NOTE — Progress Notes (Signed)
Patient ID: Amanda Green, female   DOB: 10-Nov-1935, 88 y.o.   MRN: [id number] 88 year old female to be admitted with a left occipital breast metastasis.  She fell and struck her head and has had some dizziness.  CT showed edema and a mass in the left occipital region and MRI shows a metastatic lesion with surrounding vasogenic edema.  CT scan of the chest abdomen pelvis shows a breast mass.  Recommend admission to medicine.  Recommend echo and radiation oncology consults to discuss this at tumor board and determine a reasonable treatment plan given her advanced age and stage IV breast disease.  Would discuss goals of care with patient and family determine how aggressive they would want to be.

## 2023-02-14 NOTE — Assessment & Plan Note (Addendum)
Seems to be at baseline mental status wise per daughter.

## 2023-02-14 NOTE — Assessment & Plan Note (Addendum)
Primary CNS neoplasm vs solitary met.  H/o breast CA (multiple times) but was thought to be cancer free following L Total mastectomy in 2022 (R mastectomy was in 1999). But now with mass in L breast area, L axilary lymphadenopathy, and apparent / ? Residual breast tissue present? (Not supposed to have any breast tissue following total mastectomy, radiologist is going to issue an addendum to report). With 33m midline shift With findings suspicious for vasogenic edema. With physical exam findings suggestive of R homonymous hemianopsia. Decadron '4mg'$  Q6H for the possible vasogenic edema CT with contrast of C/A/P = Mass in the L breast area as described above Going to see if IR will be willing to biopsy this without having to discharge patient to send to mammography as pt is supposed to have had total mastectomy. Can they even do a mammogram (to do the biopsy) on a patient post total mastectomy? [facility] Discussed above CT findings with family and patient right away.

## 2023-02-14 NOTE — ED Notes (Signed)
[facility] called. 

## 2023-02-14 NOTE — Progress Notes (Signed)
Patient ID: [name], female   DOB: [birth year], 88 y.o.   MRN: [id number] The rad onc note states that I did not feel there was a role for surgery. That is not true, I simply suggested a rad onc and med onc consult to discuss their rolls here, and the risks of formal surgery vs SRS given her advanced age and consideration of her prognosis which I am not able to predict. If surgical resection is her best option I would discuss with her and her family to determine their goals of therapy and discuss the inherent risks.

## 2023-02-14 NOTE — Progress Notes (Signed)
PROGRESS NOTE    Amanda Green  [id number] DOB: 06-06-1935 DOA: 02/13/2023 PCP: Alroy Dust, L.[name], MD    Brief Narrative:   Amanda Green is a 88 y.o. female with past medical history significant for DM2, HTN, history of CVA, paroxysmal atrial fibrillation on Eliquis, cognitive impairment, history of breast cancer s/p right mastectomy 1999, left lumpectomy 2020, left mastectomy 2022 with DCIS who presented to [facility] ED on 2/28 after sustaining a fall at home.  Patient reported some dizziness, striking the back of her head.  Denies headache, no abdominal pain, no urinary symptoms.   In the ED, temperature 98.0 F, HR 81, RR 16, BP 152/86, SpO2 96% on room air.  WBC 6.8, hemoglobin 13.7, platelets 203.  Sodium 137, potassium 3.5, chloride 103, CO2 25, BUN 12, creatinine 0.92, glucose 170.  CT head without contrast with new vasogenic edema left frontal parietal and temporal lobes with associated sulcal effacement and 4 mm midline shift to the right concerning for underlying mass.  MR brain with and without contrast with 3.2 x 3.2 x 2.8 cm intra-axial enhancing mass centered on the left occipital lobe, concerning for primary CNS neoplasm versus solitary intracranial metastasis, mild 5 mm left to right shift, no other acute intracranial abnormality.  CT chest/abdomen/pelvis with contrast with enhancing left breast mass measuring 3.8 x 2.7 cm and left axillary LAD concerning for malignancy, no evidence of distant metastasis, distended urinary bladder without bladder wall thickening, left adrenal adenoma unchanged from 2007.  Neurosurgery was consulted, Dr. Ronnald Ramp recommended radiation oncology consultation.  TRH consulted for admission and patient was transferred to [facility] for further evaluation and management.  Assessment & Plan:   Left breast mass Brain mass Hx ductal carcinoma in situ (DCIS) Left Breast Patient presenting to ED with dizziness following fall at home.   Imaging notable for left enhancing breast mass measuring 3.8 x 2.7 cm, left axillary LAD and left occipital lobe enhancing mass measuring 3.2 x 3.2 x 2.8 cm with 5 mm left-to-right shift.  Patient with history of breast cancer s/p right mastectomy 1999 followed by left mastectomy 2022.  Previously followed by medical oncology, Dr. Jana Hakim.  Now concern for recurrence of breast cancer with metastasis to the brain. -- IR consulted for biopsy -- Holding Eliquis -- Decadron 4 mg PO every 6 hours -- Radiation oncology consultation  Cardiac amyloidosis TTE 2022 with LVEF greater than [id number], grade 1 diastolic dysfunction, severe LVH --continue tafamidis 61 mg p.o. daily  Type 2 diabetes mellitus On metformin at baseline. -- Hold oral hypoglycemics while inpatient -- Moderate SSI for coverage -- CBG before every meal/at bedtime  Paroxysmal atrial fibrillation Essential hypertension --Cardizem 180 mg p.o. daily --Hydrochlorothiazide 12.5 mg p.o. daily -- Holding Eliquis  Hyperlipidemia -- Atorvastatin 80 mg p.o. daily  Cognitive impairment --Namenda 10 mg p.o. twice daily Supportive care   DVT prophylaxis: SCDs, holding prophylactic chemical DVT prophylaxis given need for biopsy, brain mets with vasogenic edema    Code Status: Full Code Family Communication: Updated daughter present at bedside  Disposition Plan:  Level of care: Med-Surg Status is: Inpatient Remains inpatient appropriate because: Biopsy, further plans per specialist      Consultants:  Neurosurgery, Dr. Ronnald Ramp Radiation oncology  Procedures:  Biopsy  Antimicrobials:  None   Subjective: Patient seen examined bedside, resting comfortably.  Lying in bed.  Daughter present.  Discussed findings once again on imaging results and showed imaging to daughter on the bedside computer.  Discussed  concerning for primary recurrent breast cancer with metastasis to the brain.  Daughter asked what stage would this be  considered, discussed with her this would be stage IV.  Also discussed with her radiation oncology evaluation.  Currently pending biopsy by IR.  No other questions or concerns at this time.  Denies headache, no current dizziness, no chest pain, no palpitations, no shortness of breath, no abdominal pain, no fever/chills/night sweats, no nausea cefonicid diarrhea, no focal weakness, no fatigue, no paresthesias.  No acute events overnight per nursing staff.  Objective: Vitals:   02/14/23 0800 02/14/23 1000 02/14/23 1100 02/14/23 1242  BP: (!) 154/76 (!) 158/84 (!) 154/90 (!) 155/92  Pulse: 79 86 93 96  Resp: '20 20 20 '$ (!) 28  Temp: 98.2 F (36.8 C) 98 F (36.7 C) 98.2 F (36.8 C) 97.8 F (36.6 C)  TempSrc:    Oral  SpO2: 99% 99% 99% 95%  Weight:    74.9 kg  Height:    '5\' 3"'$  (1.6 m)    Intake/Output Summary (Last 24 hours) at 02/14/2023 1518 Last data filed at 02/14/2023 0700 Gross per 24 hour  Intake --  Output 800 ml  Net -800 ml   Filed Weights   02/13/23 2121 02/14/23 1242  Weight: 71.5 kg 74.9 kg    Examination:  Physical Exam: GEN: NAD, alert and oriented x 3, elderly in appearance HEENT: NCAT, PERRL, EOMI, sclera clear, MMM PULM: CTAB w/o wheezes/crackles, normal respiratory effort, on room air CV: RRR w/o M/G/R GI: abd soft, NTND, NABS, no R/G/M MSK: no peripheral edema, moves all extremity independently NEURO: No focal deficits PSYCH: normal mood/affect Integumentary: dry/intact, no rashes or wounds    Data Reviewed: I have personally reviewed following labs and imaging studies  CBC: Recent Labs  Lab 02/13/23 2205  WBC 6.8  HGB 13.7  HCT 43.2  MCV 91.1  PLT [id number]   Basic Metabolic Panel: Recent Labs  Lab 02/13/23 2205  NA 137  K 3.5  CL 103  CO2 25  GLUCOSE 170*  BUN 12  CREATININE 0.92  CALCIUM 10.0   GFR: Estimated Creatinine Clearance: 42.5 mL/min (by C-G formula based on SCr of 0.92 mg/dL). Liver Function Tests: No results for input(s):  "AST", "ALT", "ALKPHOS", "BILITOT", "PROT", "ALBUMIN" in the last 168 hours. No results for input(s): "LIPASE", "AMYLASE" in the last 168 hours. No results for input(s): "AMMONIA" in the last 168 hours. Coagulation Profile: No results for input(s): "INR", "PROTIME" in the last 168 hours. Cardiac Enzymes: No results for input(s): "CKTOTAL", "CKMB", "CKMBINDEX", "TROPONINI" in the last 168 hours. BNP (last 3 results) No results for input(s): "PROBNP" in the last 8760 hours. HbA1C: No results for input(s): "HGBA1C" in the last 72 hours. CBG: Recent Labs  Lab 02/14/23 0320 02/14/23 0734 02/14/23 1255  GLUCAP 90 102* 174*   Lipid Profile: No results for input(s): "CHOL", "HDL", "LDLCALC", "TRIG", "CHOLHDL", "LDLDIRECT" in the last 72 hours. Thyroid Function Tests: No results for input(s): "TSH", "T4TOTAL", "FREET4", "T3FREE", "THYROIDAB" in the last 72 hours. Anemia Panel: No results for input(s): "VITAMINB12", "FOLATE", "FERRITIN", "TIBC", "IRON", "RETICCTPCT" in the last 72 hours. Sepsis Labs: No results for input(s): "PROCALCITON", "LATICACIDVEN" in the last 168 hours.  No results found for this or any previous visit (from the past 240 hour(s)).       Radiology Studies: CT CHEST ABDOMEN PELVIS W CONTRAST  Addendum Date: 02/14/2023   ADDENDUM REPORT: 02/14/2023 03:49 ADDENDUM: Left breast surgical changes with enhancing mass and  multiple large lymph nodes in the left axilla, concerning for malignancy. Electronically Signed   By: Brett Fairy M.D.   On: 02/14/2023 03:49   Result Date: 02/14/2023 CLINICAL DATA:  Brain, CNS neoplasm. Staging and brain metastasis. Recent falls. EXAM: CT CHEST, ABDOMEN, AND PELVIS WITH CONTRAST TECHNIQUE: Multidetector CT imaging of the chest, abdomen and pelvis was performed following the standard protocol during bolus administration of intravenous contrast. RADIATION DOSE REDUCTION: This exam was performed according to the departmental  dose-optimization program which includes automated exposure control, adjustment of the mA and/or kV according to patient size and/or use of iterative reconstruction technique. CONTRAST:  125m OMNIPAQUE IOHEXOL 300 MG/ML  SOLN COMPARISON:  02/26/2022, 05/11/2006. FINDINGS: CT CHEST FINDINGS Cardiovascular: The heart is enlarged and there is no pericardial effusion. Multi-vessel coronary artery calcifications are noted. There is atherosclerotic calcification of the aorta without evidence of aneurysm. The pulmonary trunk is normal in caliber. Mediastinum/Nodes: No mediastinal or hilar lymphadenopathy. Enlarged lymph nodes are present in the left axilla. Surgical clips are noted in the right axilla. The thyroid gland, trachea, and esophagus are within normal limits. There is a small hiatal hernia. Lungs/Pleura: Atelectasis is present bilaterally. No effusion or pneumothorax. Musculoskeletal: Mastectomy changes are noted on the right. There is skin thickening over the left breast. An enhancing mass is noted in the upper-outer quadrant of the left breast/axilla measuring 3.8 x 2.7 cm. Degenerative changes in the thoracic spine. No acute or suspicious osseous abnormality. CT ABDOMEN PELVIS FINDINGS Hepatobiliary: No focal liver abnormality is seen. No gallstones, gallbladder wall thickening, or biliary dilatation. Pancreas: Unremarkable. No pancreatic ductal dilatation or surrounding inflammatory changes. Spleen: Normal in size. There is a stable hypodensity in the spleen, likely representing cysts or hemangioma. Adrenals/Urinary Tract: There is a stable left adrenal nodule measuring 1.6 cm, previously described as adenoma by MRI. The right adrenal gland is within normal limits. The kidneys enhance symmetrically. No renal calculus or hydronephrosis. The bladder is markedly distended. No bladder wall thickening. Stomach/Bowel: Small hiatal hernia. Stomach is otherwise within normal limits. Right hemicolectomy changes are  noted. No evidence of bowel wall thickening, distention, or inflammatory changes. No free air or pneumatosis. Scattered diverticula are present along the colon without evidence of diverticulitis. Vascular/Lymphatic: Aortic atherosclerosis. No enlarged abdominal or pelvic lymph nodes. Reproductive: Status post hysterectomy. No adnexal masses. Other: No abdominopelvic ascites. Fat containing umbilical hernia is noted. Musculoskeletal: Degenerative changes in the lumbar spine. No acute osseous abnormality. IMPRESSION: 1. Enhancing left breast mass and measuring 3.8 x 2.7 cm and left axillary lymphadenopathy, concerning for malignancy. Further evaluation with diagnostic mammography and ultrasound are recommended. 2. No evidence of distant metastasis. 3. Markedly distended urinary bladder with no bladder wall thickening. 4. Left adrenal adenoma, unchanged from 2007. 5. Diverticulosis without diverticulitis. 6. Aortic atherosclerosis and coronary artery calcifications. 7. Remaining incidental findings as described above. Electronically Signed: By: LBrett FairyM.D. On: 02/14/2023 02:41   MR Brain W and Wo Contrast  Result Date: 02/14/2023 CLINICAL DATA:  Initial evaluation for intracranial mass. EXAM: MRI HEAD WITHOUT AND WITH CONTRAST TECHNIQUE: Multiplanar, multiecho pulse sequences of the brain and surrounding structures were obtained without and with intravenous contrast. CONTRAST:  718mGADAVIST GADOBUTROL 1 MMOL/ML IV SOLN COMPARISON:  Prior CT from earlier the same day. FINDINGS: Brain: Mild age-related cerebral volume loss. Scattered patchy T2/FLAIR hyperintensity involving the periventricular deep white matter both cerebral hemispheres as well as the pons, most consistent with chronic small vessel ischemic disease. Few small  remote lacunar infarcts present about the basal ganglia and left thalamus. Small remote right cerebellar infarct noted as well. No evidence for acute or subacute infarct. No areas of  chronic cortical infarction. No acute intracranial hemorrhage. Single chronic microhemorrhage noted at the right occipital lobe. Intra-axial avidly enhancing mass centered at the left occipital lobe measures 3.2 x 3.2 x 2.8 cm (AP by craniocaudad by transverse). Associated mild susceptibility artifact noted about the periphery of this lesion. Extensive surrounding T2/FLAIR signal abnormality, which could reflect vasogenic edema and/or infiltrating nonenhancing tumor. Associated mild 5 mm left-to-right shift. No hydrocephalus or trapping. Basilar cisterns remain patent. No other mass or abnormal enhancement. Small nodular focus along the posterior margin of the right lateral ventricle noted, likely a focus of gray matter heterotopia. No extra-axial fluid collection. Pituitary gland and suprasellar region within normal limits. Vascular: Major intracranial vascular flow voids are maintained. Hypoplastic left vertebral artery noted. Skull and upper cervical spine: Craniocervical junction within normal limits. Bone marrow signal intensity normal. No scalp soft tissue abnormality. Sinuses/Orbits: Prior bilateral ocular lens replacement. Paranasal sinuses are largely clear. No mastoid effusion. Other: None. IMPRESSION: 1. 3.2 x 3.2 x 2.8 cm intra-axial enhancing mass centered at the left occipital lobe, corresponding with abnormality on prior CT. Primary differential considerations include a primary CNS neoplasm versus solitary intracranial metastasis. Surrounding T2/FLAIR signal abnormality could reflect vasogenic edema and/or infiltrating nonenhancing tumor. Associated mild 5 mm left-to-right shift. 2. No other acute intracranial abnormality. 3. Underlying mild age-related cerebral volume loss with chronic small vessel ischemic disease. Small remote right cerebellar infarct. Electronically Signed   By: Jeannine Boga M.D.   On: 02/14/2023 00:58   CT HEAD WO CONTRAST (5MM)  Result Date: 02/13/2023 CLINICAL DATA:   Trauma. EXAM: CT HEAD WITHOUT CONTRAST TECHNIQUE: Contiguous axial images were obtained from the base of the skull through the vertex without intravenous contrast. RADIATION DOSE REDUCTION: This exam was performed according to the departmental dose-optimization program which includes automated exposure control, adjustment of the mA and/or kV according to patient size and/or use of iterative reconstruction technique. COMPARISON:  Head CT 04/29/2021.  MRI brain 02/20/2021. FINDINGS: Brain: There is new vasogenic edema in the left frontoparietal and temporal region with associated sulcal effacement. There is 4 mm of midline shift to the right. There is no acute intracranial hemorrhage or extra-axial fluid collection. There is some mass effect on the left lateral ventricle. There is no hydrocephalus. Vascular: Atherosclerotic calcifications are present within the cavernous internal carotid arteries. Skull: Normal. Negative for fracture or focal lesion. Sinuses/Orbits: No acute finding. Other: None. IMPRESSION: New vasogenic edema in the left frontoparietal and temporal lobes with associated sulcal effacement and 4 mm of midline shift to the right. Findings are concerning for underlying mass. Contrast-enhanced MRI is recommended for further evaluation. Electronically Signed   By: Ronney Asters M.D.   On: 02/13/2023 22:06        Scheduled Meds:  atorvastatin  80 mg Oral Daily   brimonidine-timolol  1 drop Left Eye Q12H   calcium carbonate  1,250 mg Oral Q breakfast   dexamethasone  4 mg Oral Q6H   diltiazem  180 mg Oral Daily   dorzolamide  1 drop Left Eye BID   hydrochlorothiazide  12.5 mg Oral Daily   insulin aspart  0-15 Units Subcutaneous TID WC   insulin aspart  0-5 Units Subcutaneous QHS   memantine  10 mg Oral BID   Netarsudil-Latanoprost  1 drop Left Eye QHS  ofloxacin  1 drop Right Eye QID   prednisoLONE acetate  1 drop Right Eye QID   pregabalin  75 mg Oral TID   Tafamidis  61 mg Oral  Daily   Continuous Infusions:   LOS: 0 days    Time spent: 42 minutes spent on chart review, discussion with nursing staff, consultants, updating family and interview/physical exam; more than 50% of that time was spent in counseling and/or coordination of care.    Kiernan Atkerson J [place] DO [facility] Available via Epic secure chat 7am-7pm After these hours, please refer to coverage provider listed on [email or web address] 02/14/2023, 3:18 PM

## 2023-02-14 NOTE — ED Notes (Signed)
[facility] has arrived to transport patient.  

## 2023-02-14 NOTE — Consult Note (Signed)
Radiation Oncology         [phone] ________________________________  Initial inpatient Consultation  Name: Amanda Green MRN: [id number]  Date of Service: 02/13/2023 DOB: July 03, 1935  CC:Mitchell, L.[name], MD  [place] Eric, DO  REFERRING PHYSICIAN: [place] Eric, DO  DIAGNOSIS: 88 y/o female with likely metastatic breast cancer with a solitary brain metastasis, tissue biopsy pending    ICD-10-CM   1. Fall, initial encounter  W19.Amanda Green       HISTORY OF PRESENT ILLNESS: [name] is a 88 y.o. female seen at the request of Dr. [place] She has a history of bilateral breast cancers, s/p right mastectomy in 1999, left lumpectomy in 2020 for ductal carcinoma in situ with calcifications, grade 3, ER+/PR- and a left mastectomy in 04/2021 for disease recurrence. Pathology at that time showed a microscopic focus of invasive ductal carcinoma, 0.1 cm; extensive high-grade ductal carcinoma in situ with necrosis and calcifications; Paget's disease of nipple; margins not involved. Prognostic panel was repeated due to the new invasive disease, ER-/PR- with Ki-67 of 15% and Her2 positive by immunohistochemistry (3+). She has been doing well since that time, although she does have some progressive dementia, she has been able to continue living independently at home. She got dizzy and had a fall at home yesterday and struck the back of her head, prompting ER evaluation. A CT Head on admission showed new vasogenic edema in the left frontoparietal and temporal lobes with associated sulcal effacement and 4 mm of midline shift to the right, concerning for underlying mass. Therefore, an MRI brain was performed for further evaluation and this showed a 3.2 x 3.2 x 2.8 cm intra-axial enhancing mass centered at the left occipital lobe, corresponding with abnormality on prior CT. There was associated mild 5 mm left-to-right shift but no other acute intracranial abnormalities seen. She was started on Decadron  4 mg every 6 hours and reports that she is no longer having any dizziness. CT C/A/P was performed for disease staging and this revealed left breast surgical changes with a 3.8 x 2.7 cm enhancing mass and multiple large lymph nodes in the left axilla, concerning for recurrent malignancy but no evidence of distant metastatic disease. Her MRI brain findings were discussed with Dr. Ronnald Ramp and he did not feel that there was any role for surgery but instead recommended consideration of stereotactic radiosurgery [facility]).  Interventional radiology was also consulted and planning for an U/S guided biopsy of axillary lymph nodes in the near future.  We have been consulted to discuss the potential role of radiation therapy in the management of her disease.  PREVIOUS RADIATION THERAPY: No  PAST MEDICAL HISTORY:  Past Medical History:  Diagnosis Date   Arthritis    knees,   Breast cancer ([place]) 1999   right mastectomy   Breast cancer, left ([place]) 05/2019   left lumpectomy   Diabetes mellitus    Dislocation of left shoulder joint    Family history of prostate cancer    Hyperlipidemia    Hypertension    Memory loss    Numbness and tingling of right lower extremity    Paresthesia    Stroke ([place])    light stroke - 2012 ,right leg nerve damage       PAST SURGICAL HISTORY: Past Surgical History:  Procedure Laterality Date   ABDOMINAL HYSTERECTOMY     APPENDECTOMY     BREAST BIOPSY Left 01/19/2010   BREAST LUMPECTOMY WITH RADIOACTIVE SEED LOCALIZATION Left 07/17/2019   Procedure:  LEFT BREAST LUMPECTOMY WITH RADIOACTIVE SEED LOCALIZATION;  Surgeon: Johnathan Hausen, MD;  Location: [place];  Service: General;  Laterality: Left;   BREAST SURGERY     Right mastectomy [place] Right 09/30/2014   Procedure: RIGHT CARPAL TUNNEL RELEASE;  Surgeon: Daryll Brod, MD;  Location: [place];  Service: Orthopedics;  Laterality: Right;   COLONOSCOPY     EYE SURGERY      cataracts   JOINT REPLACEMENT     lt total knee 2011   LIPOMA EXCISION  11/28/2011   Procedure: EXCISION LIPOMA;  Surgeon: Pedro Earls, MD;  Location: [place];  Service: General;  Laterality: N/A;  excisino lipoma 4 cm back of neck   MASTECTOMY Right 1999   TOTAL MASTECTOMY Left 05/10/2021   Procedure: LEFT TOTAL MASTECTOMY;  Surgeon: Johnathan Hausen, MD;  Location: [facility];  Service: General;  Laterality: Left;    FAMILY HISTORY:  Family History  Problem Relation Age of Onset   Esophageal cancer Mother        smoker   Other Father        Unsure of medical history.   Prostate cancer Brother        d. 75s   Cancer Son        ?? unsure, but may have had cancer   Cancer Son        ?? unsure, but may have had cancer    SOCIAL HISTORY:  Social History   Socioeconomic History   Marital status: Widowed    Spouse name: Not on file   Number of children: 5   Years of education: HS - 12 years   Highest education level: Not on file  Occupational History   Occupation: Retired  Tobacco Use   Smoking status: Never   Smokeless tobacco: Never  Vaping Use   Vaping Use: Never used  Substance and Sexual Activity   Alcohol use: No   Drug use: No   Sexual activity: Not on file  Other Topics Concern   Not on file  Social History Narrative   Lives at home alone.   Right-handed.   1 cup caffeine daily.   Social Determinants of Health   Financial Resource Strain: Not on file  Food Insecurity: Not on file  Transportation Needs: Not on file  Physical Activity: Not on file  Stress: Not on file  Social Connections: Not on file  Intimate Partner Violence: Not on file    ALLERGIES: Gabapentin  MEDICATIONS:  Current Facility-Administered Medications  Medication Dose Route Frequency Provider Last Rate Last Admin   acetaminophen (TYLENOL) tablet 650 mg  650 mg Oral Q6H PRN Etta Quill, DO       Or   acetaminophen (TYLENOL) suppository 650 mg  650 mg  Rectal Q6H PRN Etta Quill, DO       atorvastatin (LIPITOR) tablet 80 mg  80 mg Oral Daily Jennette Kettle M, DO   80 mg at 02/14/23 0924   brimonidine-timolol (COMBIGAN) 0.2-0.5 % ophthalmic solution 1 drop  1 drop Left Eye Q12H Jennette Kettle M, DO       calcium carbonate (OS-CAL - dosed in mg of elemental calcium) tablet 1,250 mg  1,250 mg Oral Q breakfast Etta Quill, DO   1,250 mg at 02/14/23 0742   dexamethasone (DECADRON) tablet 4 mg  4 mg Oral Q6H Jennette Kettle M, DO   4 mg at 02/14/23 [phone]  diltiazem (CARDIZEM CD) 24 hr capsule 180 mg  180 mg Oral Daily Jennette Kettle M, DO   180 mg at 02/14/23 0925   dorzolamide (TRUSOPT) 2 % ophthalmic solution 1 drop  1 drop Left Eye BID Jennette Kettle M, DO       hydrochlorothiazide (HYDRODIURIL) tablet 12.5 mg  12.5 mg Oral Daily Jennette Kettle M, DO   12.5 mg at 02/14/23 [id number]   insulin aspart (novoLOG) injection 0-15 Units  0-15 Units Subcutaneous TID WC Etta Quill, DO   3 Units at 02/14/23 1315   insulin aspart (novoLOG) injection 0-5 Units  0-5 Units Subcutaneous QHS Jennette Kettle M, DO       memantine [facility]) tablet 10 mg  10 mg Oral BID Jennette Kettle M, DO   10 mg at 02/14/23 [id number]   Netarsudil-Latanoprost 0.02-0.005 % SOLN 1 drop  1 drop Left Eye QHS Jennette Kettle M, DO       ofloxacin (OCUFLOX) 0.3 % ophthalmic solution 1 drop  1 drop Right Eye QID Jennette Kettle M, DO   1 drop at 02/14/23 0925   ondansetron (ZOFRAN) tablet 4 mg  4 mg Oral Q6H PRN Etta Quill, DO       Or   ondansetron [facility]) injection 4 mg  4 mg Intravenous Q6H PRN Etta Quill, DO       prednisoLONE acetate (PRED FORTE) 1 % ophthalmic suspension 1 drop  1 drop Right Eye QID Alcario Drought, Jared M, DO       pregabalin (LYRICA) capsule 75 mg  75 mg Oral TID Etta Quill, DO   75 mg at 02/14/23 [id number]   Tafamidis CAPS 61 mg  61 mg Oral Daily Etta Quill, DO        REVIEW OF SYSTEMS:  On review of systems, the patient reports that she is doing  well overall and currently without complaints. She denies any chest pain, shortness of breath, cough, fevers, chills, night sweats, or recent unintended weight changes. She denies any bowel or bladder disturbances, and denies abdominal pain, nausea or vomiting. She denies any new musculoskeletal or joint aches or pains. She is not having headaches and denies any focal weakness or paraesthesias in the extremities. A complete review of systems is obtained and is otherwise negative.    PHYSICAL EXAM:  Wt Readings from Last 3 Encounters:  02/14/23 165 lb 2 oz (74.9 kg)  01/15/23 157 lb 9.6 oz (71.5 kg)  07/24/22 157 lb (71.2 kg)   Temp Readings from Last 3 Encounters:  02/14/23 97.8 F (36.6 C) (Oral)  08/31/21 (!) 97.2 F (36.2 C) (Temporal)  05/11/21 98 F (36.7 C) (Oral)   BP Readings from Last 3 Encounters:  02/14/23 (!) 155/92  01/15/23 (!) 153/89  07/24/22 126/74   Pulse Readings from Last 3 Encounters:  02/14/23 96  01/15/23 95  07/24/22 83   Pain Assessment Pain Score: 0-No pain/10  Unable to assess due to telephone consult visit format.  KPS = 80  100 - Normal; no complaints; no evidence of disease. 90   - Able to carry on normal activity; minor signs or symptoms of disease. 80   - Normal activity with effort; some signs or symptoms of disease. 9   - Cares for self; unable to carry on normal activity or to do active work. 60   - Requires occasional assistance, but is able to care for most of his personal needs. 50   - Requires considerable assistance  and frequent medical care. 30   - Disabled; requires special care and assistance. 82   - Severely disabled; hospital admission is indicated although death not imminent. 31   - Very sick; hospital admission necessary; active supportive treatment necessary. 10   - Moribund; fatal processes progressing rapidly. 0     - Dead  Karnofsky DA, Abelmann [place], Craver LS and Burchenal [facility] [phone]) The use of the nitrogen mustards in  the palliative treatment of carcinoma: with particular reference to bronchogenic carcinoma Cancer 1 [id number]  LABORATORY DATA:  Lab Results  Component Value Date   WBC 6.8 02/13/2023   HGB 13.7 02/13/2023   HCT 43.2 02/13/2023   MCV 91.1 02/13/2023   PLT 203 02/13/2023   Lab Results  Component Value Date   NA 137 02/13/2023   K 3.5 02/13/2023   CL 103 02/13/2023   CO2 25 02/13/2023   Lab Results  Component Value Date   ALT 11 04/15/2021   AST 15 04/15/2021   ALKPHOS 39 04/15/2021   BILITOT 0.7 04/15/2021     RADIOGRAPHY: CT CHEST ABDOMEN PELVIS W CONTRAST  Addendum Date: 02/14/2023   ADDENDUM REPORT: 02/14/2023 03:49 ADDENDUM: Left breast surgical changes with enhancing mass and multiple large lymph nodes in the left axilla, concerning for malignancy. Electronically Signed   By: Brett Fairy M.D.   On: 02/14/2023 03:49   Result Date: 02/14/2023 CLINICAL DATA:  Brain, CNS neoplasm. Staging and brain metastasis. Recent falls. EXAM: CT CHEST, ABDOMEN, AND PELVIS WITH CONTRAST TECHNIQUE: Multidetector CT imaging of the chest, abdomen and pelvis was performed following the standard protocol during bolus administration of intravenous contrast. RADIATION DOSE REDUCTION: This exam was performed according to the departmental dose-optimization program which includes automated exposure control, adjustment of the mA and/or kV according to patient size and/or use of iterative reconstruction technique. CONTRAST:  125m OMNIPAQUE IOHEXOL 300 MG/ML  SOLN COMPARISON:  02/26/2022, 05/11/2006. FINDINGS: CT CHEST FINDINGS Cardiovascular: The heart is enlarged and there is no pericardial effusion. Multi-vessel coronary artery calcifications are noted. There is atherosclerotic calcification of the aorta without evidence of aneurysm. The pulmonary trunk is normal in caliber. Mediastinum/Nodes: No mediastinal or hilar lymphadenopathy. Enlarged lymph nodes are present in the left axilla. Surgical clips are noted  in the right axilla. The thyroid gland, trachea, and esophagus are within normal limits. There is a small hiatal hernia. Lungs/Pleura: Atelectasis is present bilaterally. No effusion or pneumothorax. Musculoskeletal: Mastectomy changes are noted on the right. There is skin thickening over the left breast. An enhancing mass is noted in the upper-outer quadrant of the left breast/axilla measuring 3.8 x 2.7 cm. Degenerative changes in the thoracic spine. No acute or suspicious osseous abnormality. CT ABDOMEN PELVIS FINDINGS Hepatobiliary: No focal liver abnormality is seen. No gallstones, gallbladder wall thickening, or biliary dilatation. Pancreas: Unremarkable. No pancreatic ductal dilatation or surrounding inflammatory changes. Spleen: Normal in size. There is a stable hypodensity in the spleen, likely representing cysts or hemangioma. Adrenals/Urinary Tract: There is a stable left adrenal nodule measuring 1.6 cm, previously described as adenoma by MRI. The right adrenal gland is within normal limits. The kidneys enhance symmetrically. No renal calculus or hydronephrosis. The bladder is markedly distended. No bladder wall thickening. Stomach/Bowel: Small hiatal hernia. Stomach is otherwise within normal limits. Right hemicolectomy changes are noted. No evidence of bowel wall thickening, distention, or inflammatory changes. No free air or pneumatosis. Scattered diverticula are present along the colon without evidence of diverticulitis. Vascular/Lymphatic: Aortic atherosclerosis. No enlarged  abdominal or pelvic lymph nodes. Reproductive: Status post hysterectomy. No adnexal masses. Other: No abdominopelvic ascites. Fat containing umbilical hernia is noted. Musculoskeletal: Degenerative changes in the lumbar spine. No acute osseous abnormality. IMPRESSION: 1. Enhancing left breast mass and measuring 3.8 x 2.7 cm and left axillary lymphadenopathy, concerning for malignancy. Further evaluation with diagnostic mammography  and ultrasound are recommended. 2. No evidence of distant metastasis. 3. Markedly distended urinary bladder with no bladder wall thickening. 4. Left adrenal adenoma, unchanged from 2007. 5. Diverticulosis without diverticulitis. 6. Aortic atherosclerosis and coronary artery calcifications. 7. Remaining incidental findings as described above. Electronically Signed: By: Brett Fairy M.D. On: 02/14/2023 02:41   MR Brain W and Wo Contrast  Result Date: 02/14/2023 CLINICAL DATA:  Initial evaluation for intracranial mass. EXAM: MRI HEAD WITHOUT AND WITH CONTRAST TECHNIQUE: Multiplanar, multiecho pulse sequences of the brain and surrounding structures were obtained without and with intravenous contrast. CONTRAST:  92m GADAVIST GADOBUTROL 1 MMOL/ML IV SOLN COMPARISON:  Prior CT from earlier the same day. FINDINGS: Brain: Mild age-related cerebral volume loss. Scattered patchy T2/FLAIR hyperintensity involving the periventricular deep white matter both cerebral hemispheres as well as the pons, most consistent with chronic small vessel ischemic disease. Few small remote lacunar infarcts present about the basal ganglia and left thalamus. Small remote right cerebellar infarct noted as well. No evidence for acute or subacute infarct. No areas of chronic cortical infarction. No acute intracranial hemorrhage. Single chronic microhemorrhage noted at the right occipital lobe. Intra-axial avidly enhancing mass centered at the left occipital lobe measures 3.2 x 3.2 x 2.8 cm (AP by craniocaudad by transverse). Associated mild susceptibility artifact noted about the periphery of this lesion. Extensive surrounding T2/FLAIR signal abnormality, which could reflect vasogenic edema and/or infiltrating nonenhancing tumor. Associated mild 5 mm left-to-right shift. No hydrocephalus or trapping. Basilar cisterns remain patent. No other mass or abnormal enhancement. Small nodular focus along the posterior margin of the right lateral  ventricle noted, likely a focus of gray matter heterotopia. No extra-axial fluid collection. Pituitary gland and suprasellar region within normal limits. Vascular: Major intracranial vascular flow voids are maintained. Hypoplastic left vertebral artery noted. Skull and upper cervical spine: Craniocervical junction within normal limits. Bone marrow signal intensity normal. No scalp soft tissue abnormality. Sinuses/Orbits: Prior bilateral ocular lens replacement. Paranasal sinuses are largely clear. No mastoid effusion. Other: None. IMPRESSION: 1. 3.2 x 3.2 x 2.8 cm intra-axial enhancing mass centered at the left occipital lobe, corresponding with abnormality on prior CT. Primary differential considerations include a primary CNS neoplasm versus solitary intracranial metastasis. Surrounding T2/FLAIR signal abnormality could reflect vasogenic edema and/or infiltrating nonenhancing tumor. Associated mild 5 mm left-to-right shift. 2. No other acute intracranial abnormality. 3. Underlying mild age-related cerebral volume loss with chronic small vessel ischemic disease. Small remote right cerebellar infarct. Electronically Signed   By: BJeannine BogaM.D.   On: 02/14/2023 00:58   CT HEAD WO CONTRAST (5MM)  Result Date: 02/13/2023 CLINICAL DATA:  Trauma. EXAM: CT HEAD WITHOUT CONTRAST TECHNIQUE: Contiguous axial images were obtained from the base of the skull through the vertex without intravenous contrast. RADIATION DOSE REDUCTION: This exam was performed according to the departmental dose-optimization program which includes automated exposure control, adjustment of the mA and/or kV according to patient size and/or use of iterative reconstruction technique. COMPARISON:  Head CT 04/29/2021.  MRI brain 02/20/2021. FINDINGS: Brain: There is new vasogenic edema in the left frontoparietal and temporal region with associated sulcal effacement. There is 4 mm of  midline shift to the right. There is no acute intracranial  hemorrhage or extra-axial fluid collection. There is some mass effect on the left lateral ventricle. There is no hydrocephalus. Vascular: Atherosclerotic calcifications are present within the cavernous internal carotid arteries. Skull: Normal. Negative for fracture or focal lesion. Sinuses/Orbits: No acute finding. Other: None. IMPRESSION: New vasogenic edema in the left frontoparietal and temporal lobes with associated sulcal effacement and 4 mm of midline shift to the right. Findings are concerning for underlying mass. Contrast-enhanced MRI is recommended for further evaluation. Electronically Signed   By: Ronney Asters M.D.   On: 02/13/2023 22:06      IMPRESSION/PLAN: 1. 88 y.o. female with likely metastatic breast cancer with a solitary brain metastasis, tissue biopsy pending. Having reviewed the patient's case and imaging with Dr. Lisbeth Renshaw, I talked to the patient and her daughter, [name], about the findings and workup thus far. We discussed the natural history of breast cancer and general treatment, highlighting the role of radiotherapy in the management.  At this point, the patient would potentially benefit from radiotherapy. The options for management of the brain disease include whole brain irradiation versus stereotactic radiosurgery. There are pros and cons associated with each of these potential treatment options. Whole brain radiotherapy would treat the known metastatic deposits and help provide some reduction of risk for future brain metastases. However, whole brain radiotherapy carries potential risks including hair loss, subacute somnolence, and neurocognitive changes including a possible reduction in short-term memory. Whole brain radiotherapy also may carry a lower likelihood of tumor control at the treatment sites because of the low-dose used. Stereotactic radiosurgery carries a higher likelihood for local tumor control at the targeted sites with lower associated risk for neurocognitive changes  such as memory loss. However, the use of stereotactic radiosurgery in this setting may leave the patient at increased risk for new brain metastases elsewhere in the brain as high as 50-60%. Accordingly, patients who receive stereotactic radiosurgery in this setting should undergo ongoing surveillance imaging with brain MRI more frequently in order to identify and treat new small brain metastases before they become symptomatic. Stereotactic radiosurgery does carry some different risks, including a risk of radionecrosis. We discussed the need for a treatment planning SRS protocol MRI to confirm that she is indeed a good candidate for [facility] and we will plan to review the imaging in the multidisciplinary brain conference on Monday 02/18/23. Tentatively, our plan will be to proceed with fractionated SRS unless we see a significant number of lesions on the 3T MRI which could possibly warrant whole brain radiation. We also discussed that pending tissue confirmation with biopsy of the chest wall mass, we would likely offer a 2 week course of palliative radiation to the mass in an effort to shrink the tumor and prevent erosion through the skin and hopefully at minimum, slow the progression of the cancer. We will need medical oncology's input once we have tissue confirmation, to see if she is a candidate for any systemic therapy, pending an outpatient PET scan once she is discharged.  The patient's daughter, [name], was encouraged to ask questions that were answered to her stated satisfaction and she is in agreement to proceed with the [facility] protocol MRI and U/S biopsy of the left chest mass for treatment planning purposes. We will plan to reach back out to [facility] once we have the information from these studies so that we can confirm a formal treatment plan. If the patient is stable for discharge prior to  starting and/or completing radiation, we can certainly arrange for outpatient treatments.  I personally spent 70 minutes in  this encounter including chart review, reviewing radiological studies, telephone conversation with the patient and her daughter, [name], entering orders and completing documentation.    Nicholos Johns, PA-C   Jodelle Gross, MD, PhD   [facility]  Radiation Oncology Direct Dial: [phone]  Fax: [phone] [email or web address]  Skype  LinkedIn

## 2023-02-14 NOTE — Assessment & Plan Note (Addendum)
Cont cardizem. Hold eliquis for the moment until we know more about the brain mass / biopsy plans.

## 2023-02-14 NOTE — Consult Note (Signed)
Chief Complaint: Patient was seen in consultation today for left chest mass  Referring Physician(s): Dr. Jennette Kettle  Supervising Physician: Corrie Mckusick  Patient Status: [facility] - In-pt  History of Present Illness: Amanda Green is a 88 y.o. female with past medical history of bilateral breast cancer s/p double mastectomy (R 1999, L 2022), a fib on Eliquis, DM, HLD, HTN, prior stroke, parasthesias who presented to [facility] ED s/p fall with confusion at home.    CT Head showed: New vasogenic edema in the left frontoparietal and temporal lobes with associated sulcal effacement and 4 mm of midline shift to the right. Findings are concerning for underlying mass. Contrast-enhanced MRI is recommended for further evaluation.  CT Chest Abdomen Pelvis today showed: 1. Enhancing left breast mass and measuring 3.8 x 2.7 cm and left axillary lymphadenopathy, concerning for malignancy. Further evaluation with diagnostic mammography and ultrasound are recommended. 2. No evidence of distant metastasis. 3. Markedly distended urinary bladder with no bladder wall thickening. 4. Left adrenal adenoma, unchanged from 2007. 5. Diverticulosis without diverticulitis. 6. Aortic atherosclerosis and coronary artery calcifications. 7. Remaining incidental findings as described above.  IR consulted for left chest wall mass biopsy. Case reviewed and approved by Dr. Earleen Newport.    Patient assessed at bedside alongside her daughter.  She is alert, agreeable to procedure.  She did have breakfast this AM.  They are aware of the plans for transfer to [facility] for Neurology/Neurosurgery services.  Past Medical History:  Diagnosis Date   Arthritis    knees,   Breast cancer ([place]) 1999   right mastectomy   Breast cancer, left ([place]) 05/2019   left lumpectomy   Diabetes mellitus    Dislocation of left shoulder joint    Family history of prostate cancer    Hyperlipidemia    Hypertension    Memory loss    Numbness and  tingling of right lower extremity    Paresthesia    Stroke ([place])    light stroke - 2012 ,right leg nerve damage     Past Surgical History:  Procedure Laterality Date   ABDOMINAL HYSTERECTOMY     APPENDECTOMY     BREAST BIOPSY Left 01/19/2010   BREAST LUMPECTOMY WITH RADIOACTIVE SEED LOCALIZATION Left 07/17/2019   Procedure: LEFT BREAST LUMPECTOMY WITH RADIOACTIVE SEED LOCALIZATION;  Surgeon: Johnathan Hausen, MD;  Location: [place];  Service: General;  Laterality: Left;   BREAST SURGERY     Right mastectomy [place] Right 09/30/2014   Procedure: RIGHT CARPAL TUNNEL RELEASE;  Surgeon: Daryll Brod, MD;  Location: [place];  Service: Orthopedics;  Laterality: Right;   COLONOSCOPY     EYE SURGERY     cataracts   JOINT REPLACEMENT     lt total knee 2011   LIPOMA EXCISION  11/28/2011   Procedure: EXCISION LIPOMA;  Surgeon: Pedro Earls, MD;  Location: [place];  Service: General;  Laterality: N/A;  excisino lipoma 4 cm back of neck   MASTECTOMY Right 1999   TOTAL MASTECTOMY Left 05/10/2021   Procedure: LEFT TOTAL MASTECTOMY;  Surgeon: Johnathan Hausen, MD;  Location: [facility];  Service: General;  Laterality: Left;    Allergies: Gabapentin  Medications: Prior to Admission medications   Medication Sig Start Date End Date Taking? Authorizing Provider  apixaban (ELIQUIS) 5 MG TABS tablet Take 5 mg by mouth 2 (two) times daily. 06/23/21  Yes [provider]  atorvastatin (LIPITOR) 80 MG  tablet Take 80 mg by mouth daily.   Yes [provider]  brimonidine-timolol (COMBIGAN) 0.2-0.5 % ophthalmic solution Place 1 drop into the left eye every 12 (twelve) hours.   Yes [provider]  calcium carbonate (OSCAL) 1500 (600 Ca) MG TABS tablet Take 1,500 mg by mouth daily with breakfast.   Yes [provider]  diltiazem (TIAZAC) 180 MG 24 hr capsule Take 180 mg by mouth daily. 02/13/23  Yes  [provider]  dorzolamide (TRUSOPT) 2 % ophthalmic solution Place 1 drop into the left eye 2 (two) times daily. 10/19/20  Yes [provider]  Ferrous Sulfate (IRON PO) Take 1 tablet by mouth daily.   Yes [provider]  hydrochlorothiazide (HYDRODIURIL) 12.5 MG tablet Take 12.5 mg by mouth daily. 01/24/23  Yes [provider]  magnesium 30 MG tablet Take 30 mg by mouth daily.   Yes [provider]  memantine (NAMENDA) 10 MG tablet Take 1 tablet (10 mg total) by mouth 2 (two) times daily. 02/04/23  Yes Frann Rider, NP  metFORMIN (GLUCOPHAGE) 500 MG tablet Take 500 mg by mouth 2 (two) times daily with a meal. 08/18/11  Yes [provider]  ofloxacin (OCUFLOX) 0.3 % ophthalmic solution Place 1 drop into the right eye 4 (four) times daily. 02/06/23  Yes [provider]  prednisoLONE acetate (PRED FORTE) 1 % ophthalmic suspension Place 1 drop into the right eye 4 (four) times daily. 07/18/22  Yes [provider]  pregabalin (LYRICA) 75 MG capsule Take 75 mg by mouth 3 (three) times daily. 02/13/23  Yes [provider]  ROCKLATAN 0.02-0.005 % SOLN Place 1 drop into the left eye at bedtime. 01/31/23  Yes [provider]  Tafamidis (VYNDAMAX) 61 MG CAPS Take 61 mg by mouth daily. 04/27/22  Yes Croitoru, Mihai, MD  vitamin B-12 (CYANOCOBALAMIN) 1000 MCG tablet Take 1,000 mcg by mouth daily.   Yes [provider]  vitamin C (ASCORBIC ACID) 500 MG tablet Take 500 mg by mouth daily.   Yes [provider]     Family History  Problem Relation Age of Onset   Esophageal cancer Mother        smoker   Other Father        Unsure of medical history.   Prostate cancer Brother        d. 23s   Cancer Son        ?? unsure, but may have had cancer   Cancer Son        ?? unsure, but may have had cancer    Social History   Socioeconomic History   Marital status: Widowed    Spouse name: Not on file   Number  of children: 5   Years of education: HS - 12 years   Highest education level: Not on file  Occupational History   Occupation: Retired  Tobacco Use   Smoking status: Never   Smokeless tobacco: Never  Vaping Use   Vaping Use: Never used  Substance and Sexual Activity   Alcohol use: No   Drug use: No   Sexual activity: Not on file  Other Topics Concern   Not on file  Social History Narrative   Lives at home alone.   Right-handed.   1 cup caffeine daily.   Social Determinants of Health   Financial Resource Strain: Not on file  Food Insecurity: Not on file  Transportation Needs: Not on file  Physical Activity: Not on  file  Stress: Not on file  Social Connections: Not on file     Review of Systems: A 12 point ROS discussed and pertinent positives are indicated in the HPI above.  All other systems are negative.  Review of Systems  Constitutional:  Negative for fatigue and fever.  Respiratory:  Negative for cough and shortness of breath.   Cardiovascular:  Negative for chest pain.  Gastrointestinal:  Negative for abdominal pain.  Musculoskeletal:  Negative for back pain.  Psychiatric/Behavioral:  Positive for confusion. Negative for behavioral problems.     Vital Signs: BP (!) 154/76   Pulse 79   Temp 98.2 F (36.8 C)   Resp 20   Ht '5\' 3"'$  (1.6 m)   Wt 157 lb 10.1 oz (71.5 kg)   SpO2 99%   BMI 27.92 kg/m   Physical Exam Vitals and nursing note reviewed.  Constitutional:      General: She is not in acute distress.    Appearance: Normal appearance. She is not ill-appearing.  HENT:     Mouth/Throat:     Mouth: Mucous membranes are moist.     Pharynx: Oropharynx is clear.  Cardiovascular:     Rate and Rhythm: Normal rate and regular rhythm.  Pulmonary:     Effort: Pulmonary effort is normal.     Breath sounds: Normal breath sounds.  Abdominal:     General: Abdomen is flat.     Palpations: Abdomen is soft.  Skin:    General: Skin is warm and dry.      Comments: Palpation of left chest does not reveal a mass discernable from her scar tissue from prior mastectomy.  No areas of tenderness.   Neurological:     General: No focal deficit present.     Mental Status: She is alert. Mental status is at baseline.      MD Evaluation Airway: WNL Heart: WNL Abdomen: WNL Chest/ Lungs: WNL ASA  Classification: 3 Mallampati/Airway Score: Two   Imaging: CT CHEST ABDOMEN PELVIS W CONTRAST  Addendum Date: 02/14/2023   ADDENDUM REPORT: 02/14/2023 03:49 ADDENDUM: Left breast surgical changes with enhancing mass and multiple large lymph nodes in the left axilla, concerning for malignancy. Electronically Signed   By: Brett Fairy M.D.   On: 02/14/2023 03:49   Result Date: 02/14/2023 CLINICAL DATA:  Brain, CNS neoplasm. Staging and brain metastasis. Recent falls. EXAM: CT CHEST, ABDOMEN, AND PELVIS WITH CONTRAST TECHNIQUE: Multidetector CT imaging of the chest, abdomen and pelvis was performed following the standard protocol during bolus administration of intravenous contrast. RADIATION DOSE REDUCTION: This exam was performed according to the departmental dose-optimization program which includes automated exposure control, adjustment of the mA and/or kV according to patient size and/or use of iterative reconstruction technique. CONTRAST:  167m OMNIPAQUE IOHEXOL 300 MG/ML  SOLN COMPARISON:  02/26/2022, 05/11/2006. FINDINGS: CT CHEST FINDINGS Cardiovascular: The heart is enlarged and there is no pericardial effusion. Multi-vessel coronary artery calcifications are noted. There is atherosclerotic calcification of the aorta without evidence of aneurysm. The pulmonary trunk is normal in caliber. Mediastinum/Nodes: No mediastinal or hilar lymphadenopathy. Enlarged lymph nodes are present in the left axilla. Surgical clips are noted in the right axilla. The thyroid gland, trachea, and esophagus are within normal limits. There is a small hiatal hernia. Lungs/Pleura:  Atelectasis is present bilaterally. No effusion or pneumothorax. Musculoskeletal: Mastectomy changes are noted on the right. There is skin thickening over the left breast. An enhancing mass is noted in the upper-outer quadrant of  the left breast/axilla measuring 3.8 x 2.7 cm. Degenerative changes in the thoracic spine. No acute or suspicious osseous abnormality. CT ABDOMEN PELVIS FINDINGS Hepatobiliary: No focal liver abnormality is seen. No gallstones, gallbladder wall thickening, or biliary dilatation. Pancreas: Unremarkable. No pancreatic ductal dilatation or surrounding inflammatory changes. Spleen: Normal in size. There is a stable hypodensity in the spleen, likely representing cysts or hemangioma. Adrenals/Urinary Tract: There is a stable left adrenal nodule measuring 1.6 cm, previously described as adenoma by MRI. The right adrenal gland is within normal limits. The kidneys enhance symmetrically. No renal calculus or hydronephrosis. The bladder is markedly distended. No bladder wall thickening. Stomach/Bowel: Small hiatal hernia. Stomach is otherwise within normal limits. Right hemicolectomy changes are noted. No evidence of bowel wall thickening, distention, or inflammatory changes. No free air or pneumatosis. Scattered diverticula are present along the colon without evidence of diverticulitis. Vascular/Lymphatic: Aortic atherosclerosis. No enlarged abdominal or pelvic lymph nodes. Reproductive: Status post hysterectomy. No adnexal masses. Other: No abdominopelvic ascites. Fat containing umbilical hernia is noted. Musculoskeletal: Degenerative changes in the lumbar spine. No acute osseous abnormality. IMPRESSION: 1. Enhancing left breast mass and measuring 3.8 x 2.7 cm and left axillary lymphadenopathy, concerning for malignancy. Further evaluation with diagnostic mammography and ultrasound are recommended. 2. No evidence of distant metastasis. 3. Markedly distended urinary bladder with no bladder wall  thickening. 4. Left adrenal adenoma, unchanged from 2007. 5. Diverticulosis without diverticulitis. 6. Aortic atherosclerosis and coronary artery calcifications. 7. Remaining incidental findings as described above. Electronically Signed: By: Brett Fairy M.D. On: 02/14/2023 02:41   MR Brain W and Wo Contrast  Result Date: 02/14/2023 CLINICAL DATA:  Initial evaluation for intracranial mass. EXAM: MRI HEAD WITHOUT AND WITH CONTRAST TECHNIQUE: Multiplanar, multiecho pulse sequences of the brain and surrounding structures were obtained without and with intravenous contrast. CONTRAST:  39m GADAVIST GADOBUTROL 1 MMOL/ML IV SOLN COMPARISON:  Prior CT from earlier the same day. FINDINGS: Brain: Mild age-related cerebral volume loss. Scattered patchy T2/FLAIR hyperintensity involving the periventricular deep white matter both cerebral hemispheres as well as the pons, most consistent with chronic small vessel ischemic disease. Few small remote lacunar infarcts present about the basal ganglia and left thalamus. Small remote right cerebellar infarct noted as well. No evidence for acute or subacute infarct. No areas of chronic cortical infarction. No acute intracranial hemorrhage. Single chronic microhemorrhage noted at the right occipital lobe. Intra-axial avidly enhancing mass centered at the left occipital lobe measures 3.2 x 3.2 x 2.8 cm (AP by craniocaudad by transverse). Associated mild susceptibility artifact noted about the periphery of this lesion. Extensive surrounding T2/FLAIR signal abnormality, which could reflect vasogenic edema and/or infiltrating nonenhancing tumor. Associated mild 5 mm left-to-right shift. No hydrocephalus or trapping. Basilar cisterns remain patent. No other mass or abnormal enhancement. Small nodular focus along the posterior margin of the right lateral ventricle noted, likely a focus of gray matter heterotopia. No extra-axial fluid collection. Pituitary gland and suprasellar region  within normal limits. Vascular: Major intracranial vascular flow voids are maintained. Hypoplastic left vertebral artery noted. Skull and upper cervical spine: Craniocervical junction within normal limits. Bone marrow signal intensity normal. No scalp soft tissue abnormality. Sinuses/Orbits: Prior bilateral ocular lens replacement. Paranasal sinuses are largely clear. No mastoid effusion. Other: None. IMPRESSION: 1. 3.2 x 3.2 x 2.8 cm intra-axial enhancing mass centered at the left occipital lobe, corresponding with abnormality on prior CT. Primary differential considerations include a primary CNS neoplasm versus solitary intracranial metastasis. Surrounding T2/FLAIR signal abnormality  could reflect vasogenic edema and/or infiltrating nonenhancing tumor. Associated mild 5 mm left-to-right shift. 2. No other acute intracranial abnormality. 3. Underlying mild age-related cerebral volume loss with chronic small vessel ischemic disease. Small remote right cerebellar infarct. Electronically Signed   By: Jeannine Boga M.D.   On: 02/14/2023 00:58   CT HEAD WO CONTRAST (5MM)  Result Date: 02/13/2023 CLINICAL DATA:  Trauma. EXAM: CT HEAD WITHOUT CONTRAST TECHNIQUE: Contiguous axial images were obtained from the base of the skull through the vertex without intravenous contrast. RADIATION DOSE REDUCTION: This exam was performed according to the departmental dose-optimization program which includes automated exposure control, adjustment of the mA and/or kV according to patient size and/or use of iterative reconstruction technique. COMPARISON:  Head CT 04/29/2021.  MRI brain 02/20/2021. FINDINGS: Brain: There is new vasogenic edema in the left frontoparietal and temporal region with associated sulcal effacement. There is 4 mm of midline shift to the right. There is no acute intracranial hemorrhage or extra-axial fluid collection. There is some mass effect on the left lateral ventricle. There is no hydrocephalus.  Vascular: Atherosclerotic calcifications are present within the cavernous internal carotid arteries. Skull: Normal. Negative for fracture or focal lesion. Sinuses/Orbits: No acute finding. Other: None. IMPRESSION: New vasogenic edema in the left frontoparietal and temporal lobes with associated sulcal effacement and 4 mm of midline shift to the right. Findings are concerning for underlying mass. Contrast-enhanced MRI is recommended for further evaluation. Electronically Signed   By: Ronney Asters M.D.   On: 02/13/2023 22:06    Labs:  CBC: Recent Labs    02/13/23 2205  WBC 6.8  HGB 13.7  HCT 43.2  PLT 203    COAGS: No results for input(s): "INR", "APTT" in the last 8760 hours.  BMP: Recent Labs    02/13/23 2205  NA 137  K 3.5  CL 103  CO2 25  GLUCOSE 170*  BUN 12  CALCIUM 10.0  CREATININE 0.92  GFRNONAA >60    LIVER FUNCTION TESTS: No results for input(s): "BILITOT", "AST", "ALT", "ALKPHOS", "PROT", "ALBUMIN" in the last 8760 hours.  TUMOR MARKERS: No results for input(s): "AFPTM", "CEA", "CA199", "CHROMGRNA" in the last 8760 hours.  Assessment and Plan: Left chest wall mass Patient with history of recurrent breast cancer currently thought to be in remission under surveillance presents with confusion and fall at home.  She is found to have a new brain mass as well as left chest wall mass and lymphadenopathy.  IR consulted for biopsy.  Patient assessed at bedside alongside her daughter.  She is aware of plans.  She does have a history of memory loss.   There is a plan to transfer to [facility] for Neurology/Neurosurgery evaluation.  Plan to proceed with biopsy as able based on patient availability and schedule.  No sedation planned.  Patient is aware and agreeable.   Risks and benefits of biopsy was discussed with the patient and/or patient's family including, but not limited to bleeding, infection, damage to adjacent structures or low yield requiring additional tests.  All of  the questions were answered and there is agreement to proceed.  Consent signed and in chart.  Thank you for this interesting consult.  I greatly enjoyed meeting Amanda Green and look forward to participating in their care.  A copy of this report was sent to the requesting provider on this date.  Electronically Signed: Docia Barrier, PA 02/14/2023, 10:05 AM   I spent a total of 40 Minutes  in face to face in clinical consultation, greater than 50% of which was counseling/coordinating care for left chest wall mass.

## 2023-02-14 NOTE — Assessment & Plan Note (Signed)
Cont home BP meds ?

## 2023-02-14 NOTE — Care Plan (Signed)
This 88 years old female with PMH significant for DM 2, hypertension, memory loss, stroke, PAF on Eliquis, breast CA s/p right mastectomy 1999, left lumpectomy in 2020 followed by left mastectomy in 2020, she was considered to have graduated from cancer surveillance at that time per oncology notes.  She also has cardiac amyloidosis on tafamidis.  Patient presented in the ED s/p fall, she hit the back of her head, reports dizziness. ED workup reveals enhancing left breast mass and left axillary lymphadenopathy concerning for malignancy.  No evidence of distant metastasis.  CT head showed edema in the mass in the left occipital region and MRI showed a metastatic lesion with surrounding vasogenic edema.  Neurosurgery was consulted recommended radiation oncology evaluation and echocardiogram.  Patient also needs goals of care discussion to determine how aggressive they would like to be.  Patient was seen and examined at bedside.  Met with 2 daughters all questions answered.  Patient is going to be transferred to [facility].

## 2023-02-14 NOTE — Assessment & Plan Note (Addendum)
Lumpectomy in 2020 Left mastectomy in 2022. (R was in 1999) Question wether she was or wasn't taking tamoxifen from 2022 onc note: "She supposedly continues on tamoxifen except that I do not see it on her medication list and she does not know whether she is taking it or whether she is taking it regularly." Though "graduated" from surveillance in 2022 per onc notes as it seems breast CA was mainly non-invasive (caught very early). See discussion above, now with new mass in area where L breast was with apparently residual breast tissue being present per radiology.

## 2023-02-14 NOTE — Assessment & Plan Note (Signed)
Looks and sounds like she is on tafamidis based on Cards Aug note from 2023. Grade 1 DD, severe LVH but EF > 75% as of 2022 echo.

## 2023-02-14 NOTE — H&P (Addendum)
History and Physical    Patient: Amanda Green [id number] DOB: 09-13-1935 DOA: 02/13/2023 DOS: the patient was seen and examined on 02/14/2023 PCP: Alroy Dust, L.[name], MD  Patient coming from: Home  Chief Complaint:  Chief Complaint  Patient presents with   Fall   HPI: Roux Kohring is a 88 y.o. female with medical history significant of DM2, HTN, memory loss, stroke, PAF on eliquis.  Breast CA s/p R mastectomy in 1999, L lumpectomy in 2020 followed by L mastectomy in 2022: DCIS.  Considered to have graduated from cancer surveillance at that time per onc notes.  Also cardiac amyloid on tafamidis.  Pt in to ED after reported fall earlier today.  Hit back of head.  Having some dizziness.  Isnt sure wether this started before or after the fall, but looking through her history it seems that the confusion about timing is not likely unusual for her due to her h/o memory loss.  No pain.  Seems like she has a dementia / memory loss history as well, per 2022 onc note: "Nyla does not remember the biopsies.  She currently has no pain in the left breast.  She cannot remember what she did yesterday.  Otherwise she is very pleasant, cooperative, and for example was able to asked me to contact Dr. Hassell Done after today's visit."  Though apparently she is living home alone per daughter who is with patient today.  Denies abd pain, dysuria, feeling of incomplete bladder emptying.  Large UOP after CT scan in ED.   Review of Systems: As mentioned in the history of present illness. All other systems reviewed and are negative. Past Medical History:  Diagnosis Date   Arthritis    knees,   Breast cancer ([place]) 1999   right mastectomy   Breast cancer, left ([place]) 05/2019   left lumpectomy   Diabetes mellitus    Dislocation of left shoulder joint    Family history of prostate cancer    Hyperlipidemia    Hypertension    Memory loss    Numbness and tingling of right lower extremity    Paresthesia     Stroke ([place])    light stroke - 2012 ,right leg nerve damage    Past Surgical History:  Procedure Laterality Date   ABDOMINAL HYSTERECTOMY     APPENDECTOMY     BREAST BIOPSY Left 01/19/2010   BREAST LUMPECTOMY WITH RADIOACTIVE SEED LOCALIZATION Left 07/17/2019   Procedure: LEFT BREAST LUMPECTOMY WITH RADIOACTIVE SEED LOCALIZATION;  Surgeon: Johnathan Hausen, MD;  Location: [facility];  Service: General;  Laterality: Left;   BREAST SURGERY     Right mastectomy [place] Right 09/30/2014   Procedure: RIGHT CARPAL TUNNEL RELEASE;  Surgeon: Daryll Brod, MD;  Location: [facility];  Service: Orthopedics;  Laterality: Right;   COLONOSCOPY     EYE SURGERY     cataracts   JOINT REPLACEMENT     lt total knee 2011   LIPOMA EXCISION  11/28/2011   Procedure: EXCISION LIPOMA;  Surgeon: Pedro Earls, MD;  Location: [facility];  Service: General;  Laterality: N/A;  excisino lipoma 4 cm back of neck   MASTECTOMY Right 1999   TOTAL MASTECTOMY Left 05/10/2021   Procedure: LEFT TOTAL MASTECTOMY;  Surgeon: Johnathan Hausen, MD;  Location: [facility];  Service: General;  Laterality: Left;   Social History:  reports that she has never smoked. She has never used smokeless tobacco. She reports  that she does not drink alcohol and does not use drugs.  Allergies  Allergen Reactions   Gabapentin     Drowsiness    Family History  Problem Relation Age of Onset   Esophageal cancer Mother        smoker   Other Father        Unsure of medical history.   Prostate cancer Brother        d. 41s   Cancer Son        ?? unsure, but may have had cancer   Cancer Son        ?? unsure, but may have had cancer    Prior to Admission medications   Medication Sig Start Date End Date Taking? Authorizing Provider  apixaban (ELIQUIS) 5 MG TABS tablet Take 5 mg by mouth 2 (two) times daily. 06/23/21  Yes [provider]  atorvastatin (LIPITOR) 80 MG  tablet Take 80 mg by mouth daily.   Yes [provider]  brimonidine-timolol (COMBIGAN) 0.2-0.5 % ophthalmic solution Place 1 drop into the left eye every 12 (twelve) hours.   Yes [provider]  cromolyn (OPTICROM) 4 % ophthalmic solution 1 drop 4 (four) times daily. 01/09/23  Yes [provider]  diltiazem (TIAZAC) 180 MG 24 hr capsule Take 180 mg by mouth daily. 02/13/23  Yes [provider]  DULoxetine (CYMBALTA) 30 MG capsule Take 30 mg by mouth daily. 02/13/23  Yes [provider]  hydrochlorothiazide (HYDRODIURIL) 12.5 MG tablet Take 12.5 mg by mouth daily. 01/24/23  Yes [provider]  ofloxacin (OCUFLOX) 0.3 % ophthalmic solution Place 1 drop into the right eye 4 (four) times daily. 02/06/23  Yes [provider]  pregabalin (LYRICA) 75 MG capsule Take 75 mg by mouth 3 (three) times daily. 02/13/23  Yes [provider]  ROCKLATAN 0.02-0.005 % SOLN Apply to eye. 01/31/23  Yes [provider]  calcium carbonate (OSCAL) 1500 (600 Ca) MG TABS tablet Take 1,500 mg by mouth 2 (two) times daily with a meal.    [provider]  diltiazem (CARDIZEM CD) 240 MG 24 hr capsule Take 1 capsule (240 mg total) by mouth daily. 04/27/21   Kroeger, Lorelee Cover., PA-C  dorzolamide (TRUSOPT) 2 % ophthalmic solution Place 1 drop into both eyes daily. 10/19/20   [provider]  DULoxetine HCl 30 MG CSDR Take 30 mg by mouth daily. 01/15/23   Frann Rider, NP  hydrochlorothiazide (MICROZIDE) 12.5 MG capsule Take 12.5 mg by mouth daily.    [provider]  latanoprost (XALATAN) 0.005 % ophthalmic solution Place 1 drop into both eyes at bedtime. 11/13/15   [provider]  lisinopril (ZESTRIL) 20 MG tablet Take 20 mg by mouth daily.    [provider]  memantine (NAMENDA) 10 MG tablet Take 1 tablet (10 mg total) by mouth 2 (two) times daily. 02/04/23   Frann Rider, NP  metFORMIN (GLUCOPHAGE) 500 MG  tablet Take 1,000 mg by mouth daily. 08/18/11   [provider]  prednisoLONE acetate (PRED FORTE) 1 % ophthalmic suspension Place 1 drop into the right eye 3 (three) times daily. 07/18/22   [provider]  Tafamidis (VYNDAMAX) 61 MG CAPS Take 61 mg by mouth daily. 04/27/22   Croitoru, Mihai, MD  vitamin B-12 (CYANOCOBALAMIN) 1000 MCG tablet Take 1,000 mcg by mouth daily.    [provider]  vitamin C (ASCORBIC ACID) 500 MG tablet Take 500 mg by mouth daily.  [provider]    Physical Exam: Vitals:   02/13/23 2122 02/13/23 2225 02/13/23 2230 02/14/23 0103  BP: (!) 152/86 (!) 154/71 (!) 149/70   Pulse: 81 71 72   Resp: 16 (!) 26 (!) 24   Temp: 98 F (36.7 C)   98 F (36.7 C)  TempSrc: Oral   Oral  SpO2: 96% 94% 94%   Weight:      Height:       Constitutional: NAD, calm, comfortable Chest wall / L breast exam (RN and daughter present during exam): No obvious palpable mass in area of L breast, nipple is surgically absent, has surgical scar present. Respiratory: clear to auscultation bilaterally, no wheezing, no crackles. Normal respiratory effort. No accessory muscle use.  Cardiovascular: Regular rate and rhythm, no murmurs / rubs / gallops. No extremity edema. 2+ pedal pulses. No carotid bruits.  Abdomen: no tenderness, no masses palpated. No hepatosplenomegaly. Bowel sounds positive.  Neurologic: apparent R homonymous hemianopsia on exam. Psychiatric: Normal judgment and insight. Normal mood.   Data Reviewed:    MR Brain W and Wo Contrast 02/13/2023  Narrative CLINICAL DATA:  Initial evaluation for intracranial mass.  EXAM: MRI HEAD WITHOUT AND WITH CONTRAST  TECHNIQUE: Multiplanar, multiecho pulse sequences of the brain and surrounding structures were obtained without and with intravenous contrast.  CONTRAST:  58m GADAVIST GADOBUTROL 1 MMOL/ML IV SOLN  COMPARISON:  Prior CT from earlier the same day.  FINDINGS: Brain: Mild  age-related cerebral volume loss. Scattered patchy T2/FLAIR hyperintensity involving the periventricular deep white matter both cerebral hemispheres as well as the pons, most consistent with chronic small vessel ischemic disease. Few small remote lacunar infarcts present about the basal ganglia and left thalamus. Small remote right cerebellar infarct noted as well.  No evidence for acute or subacute infarct. No areas of chronic cortical infarction. No acute intracranial hemorrhage. Single chronic microhemorrhage noted at the right occipital lobe.  Intra-axial avidly enhancing mass centered at the left occipital lobe measures 3.2 x 3.2 x 2.8 cm (AP by craniocaudad by transverse). Associated mild susceptibility artifact noted about the periphery of this lesion. Extensive surrounding T2/FLAIR signal abnormality, which could reflect vasogenic edema and/or infiltrating nonenhancing tumor. Associated mild 5 mm left-to-right shift. No hydrocephalus or trapping. Basilar cisterns remain patent. No other mass or abnormal enhancement.  Small nodular focus along the posterior margin of the right lateral ventricle noted, likely a focus of gray matter heterotopia. No extra-axial fluid collection. Pituitary gland and suprasellar region within normal limits.  Vascular: Major intracranial vascular flow voids are maintained. Hypoplastic left vertebral artery noted.  Skull and upper cervical spine: Craniocervical junction within normal limits. Bone marrow signal intensity normal. No scalp soft tissue abnormality.  Sinuses/Orbits: Prior bilateral ocular lens replacement. Paranasal sinuses are largely clear. No mastoid effusion.  Other: None.  IMPRESSION: 1. 3.2 x 3.2 x 2.8 cm intra-axial enhancing mass centered at the left occipital lobe, corresponding with abnormality on prior CT. Primary differential considerations include a primary CNS neoplasm versus solitary intracranial metastasis.  Surrounding T2/FLAIR signal abnormality could reflect vasogenic edema and/or infiltrating nonenhancing tumor. Associated mild 5 mm left-to-right shift. 2. No other acute intracranial abnormality. 3. Underlying mild age-related cerebral volume loss with chronic small vessel ischemic disease. Small remote right cerebellar infarct.   Electronically Signed By: BJeannine BogaM.D. On: 02/14/2023 00:58  CT CHEST ABDOMEN PELVIS W CONTRAST 02/14/2023  Narrative CLINICAL DATA:  Brain, CNS neoplasm. Staging and brain metastasis. Recent falls.  EXAM:  CT CHEST, ABDOMEN, AND PELVIS WITH CONTRAST  TECHNIQUE: Multidetector CT imaging of the chest, abdomen and pelvis was performed following the standard protocol during bolus administration of intravenous contrast.  RADIATION DOSE REDUCTION: This exam was performed according to the departmental dose-optimization program which includes automated exposure control, adjustment of the mA and/or kV according to patient size and/or use of iterative reconstruction technique.  CONTRAST:  150m OMNIPAQUE IOHEXOL 300 MG/ML  SOLN  COMPARISON:  02/26/2022, 05/11/2006.  FINDINGS: CT CHEST FINDINGS  Cardiovascular: The heart is enlarged and there is no pericardial effusion. Multi-vessel coronary artery calcifications are noted. There is atherosclerotic calcification of the aorta without evidence of aneurysm. The pulmonary trunk is normal in caliber.  Mediastinum/Nodes: No mediastinal or hilar lymphadenopathy. Enlarged lymph nodes are present in the left axilla. Surgical clips are noted in the right axilla. The thyroid gland, trachea, and esophagus are within normal limits. There is a small hiatal hernia.  Lungs/Pleura: Atelectasis is present bilaterally. No effusion or pneumothorax.  Musculoskeletal: Mastectomy changes are noted on the right. There is skin thickening over the left breast. An enhancing mass is noted in the upper-outer  quadrant of the left breast/axilla measuring 3.8 x 2.7 cm. Degenerative changes in the thoracic spine. No acute or suspicious osseous abnormality.  CT ABDOMEN PELVIS FINDINGS  Hepatobiliary: No focal liver abnormality is seen. No gallstones, gallbladder wall thickening, or biliary dilatation.  Pancreas: Unremarkable. No pancreatic ductal dilatation or surrounding inflammatory changes.  Spleen: Normal in size. There is a stable hypodensity in the spleen, likely representing cysts or hemangioma.  Adrenals/Urinary Tract: There is a stable left adrenal nodule measuring 1.6 cm, previously described as adenoma by MRI. The right adrenal gland is within normal limits. The kidneys enhance symmetrically. No renal calculus or hydronephrosis. The bladder is markedly distended. No bladder wall thickening.  Stomach/Bowel: Small hiatal hernia. Stomach is otherwise within normal limits. Right hemicolectomy changes are noted. No evidence of bowel wall thickening, distention, or inflammatory changes. No free air or pneumatosis. Scattered diverticula are present along the colon without evidence of diverticulitis.  Vascular/Lymphatic: Aortic atherosclerosis. No enlarged abdominal or pelvic lymph nodes.  Reproductive: Status post hysterectomy. No adnexal masses.  Other: No abdominopelvic ascites. Fat containing umbilical hernia is noted.  Musculoskeletal: Degenerative changes in the lumbar spine. No acute osseous abnormality.  IMPRESSION: 1. Enhancing left breast mass and measuring 3.8 x 2.7 cm and left axillary lymphadenopathy, concerning for malignancy. Further evaluation with diagnostic mammography and ultrasound are recommended. 2. No evidence of distant metastasis. 3. Markedly distended urinary bladder with no bladder wall thickening. 4. Left adrenal adenoma, unchanged from 2007. 5. Diverticulosis without diverticulitis. 6. Aortic atherosclerosis and coronary artery  calcifications. 7. Remaining incidental findings as described above.   Electronically Signed By: LBrett FairyM.D. On: 02/14/2023 02:41    Assessment and Plan: * Neoplasm of brain causing mass effect on adjacent structures ([place] Primary CNS neoplasm vs solitary met.  H/o breast CA (multiple times) but was thought to be cancer free following L Total mastectomy in 2022 (R mastectomy was in 1999). But now with mass in L breast area, L axilary lymphadenopathy, and apparent / ? Residual breast tissue present? (Not supposed to have any breast tissue following total mastectomy, radiologist is going to issue an addendum to report). With 511mmidline shift With findings suspicious for vasogenic edema. With physical exam findings of R homonymous hemianopsia. Decadron '4mg'$  Q6H for the possible vasogenic edema CT with contrast of C/A/P = Mass in  the L breast area as described above Going to see if IR will be willing to biopsy this without having to discharge patient to send to mammography as pt is supposed to have had total mastectomy. Can they even do a mammogram (to do the biopsy) on a patient post total mastectomy? Looks like NS wants her over at [facility] and will see tomorrow. Discussed above CT findings with family and patient right away.  Ductal carcinoma in situ (DCIS) of left breast Lumpectomy in 2020 Left mastectomy in 2022. (R was in 1999) Question wether she was or wasn't taking tamoxifen from 2022 onc note: "She supposedly continues on tamoxifen except that I do not see it on her medication list and she does not know whether she is taking it or whether she is taking it regularly." Though "graduated" from surveillance in 2022 per onc notes as it seems breast CA was mainly non-invasive (caught very early). See discussion above, now with new mass in area where L breast was with apparently residual breast tissue being present per radiology.  Cardiac amyloidosis ([place]) Looks and sounds like she is  on tafamidis based on Cards Aug note from 2023. Grade 1 DD, severe LVH but EF > 75% as of 2022 echo.  Controlled type 2 diabetes mellitus without complication, without long-term current use of insulin (HCC) Hold metformin Mod scale SSI AC/HS, may need to increase this to resistant scale due to decadron.  Essential hypertension Cont home BP meds  Paroxysmal atrial fibrillation (HCC) Cont cardizem. Hold eliquis for the moment until we know more about the brain mass / biopsy plans.  Cognitive impairment Seems to be at baseline mental status wise per daughter.      Advance Care Planning:   Code Status: Full Code Confirmed with daughter  Consults: EDP spoke with Dr. Ronnald Ramp  Family Communication: Daughter at bedside  Severity of Illness: The appropriate patient status for this patient is OBSERVATION. Observation status is judged to be reasonable and necessary in order to provide the required intensity of service to ensure the patient's safety. The patient's presenting symptoms, physical exam findings, and initial radiographic and laboratory data in the context of their medical condition is felt to place them at decreased risk for further clinical deterioration. Furthermore, it is anticipated that the patient will be medically stable for discharge from the hospital within 2 midnights of admission.   Author: Etta Quill., DO 02/14/2023 1:47 AM  For on call review [email or web address].

## 2023-02-15 ENCOUNTER — Inpatient Hospital Stay (HOSPITAL_COMMUNITY): Payer: Medicare PPO

## 2023-02-15 DIAGNOSIS — D496 Neoplasm of unspecified behavior of brain: Secondary | ICD-10-CM | POA: Diagnosis not present

## 2023-02-15 LAB — GLUCOSE, CAPILLARY
Glucose-Capillary: 160 mg/dL — ABNORMAL HIGH (ref 70–99)
Glucose-Capillary: 257 mg/dL — ABNORMAL HIGH (ref 70–99)
Glucose-Capillary: 183 mg/dL — ABNORMAL HIGH (ref 70–99)

## 2023-02-15 LAB — HEMOGLOBIN A1C
Hgb A1c MFr Bld: 6.5 % — ABNORMAL HIGH (ref 4.8–5.6)
Mean Plasma Glucose: 140 mg/dL

## 2023-02-15 MED ORDER — BRIMONIDINE TARTRATE 0.2 % OP SOLN
1.0000 [drp] | Freq: Two times a day (BID) | OPHTHALMIC | Status: DC
  Filled 2023-02-15: qty 5

## 2023-02-15 MED ORDER — LIDOCAINE-EPINEPHRINE 1 %-1:100000 IJ SOLN
10.0000 mL | Freq: Once | INTRAMUSCULAR | Status: AC
  Administered 2023-02-15: 10 mL via INTRADERMAL

## 2023-02-15 MED ORDER — BRIMONIDINE TARTRATE-TIMOLOL 0.2-0.5 % OP SOLN
1.0000 [drp] | Freq: Two times a day (BID) | OPHTHALMIC | Status: DC
  Administered 2023-02-15 – 2023-02-18 (×7): 1 [drp] via OPHTHALMIC
  Filled 2023-02-15: qty 5

## 2023-02-15 MED ORDER — TIMOLOL MALEATE 0.5 % OP SOLN
1.0000 [drp] | Freq: Two times a day (BID) | OPHTHALMIC | Status: DC
  Filled 2023-02-15: qty 5

## 2023-02-15 MED ORDER — NETARSUDIL-LATANOPROST 0.02-0.005 % OP SOLN
1.0000 [drp] | Freq: Every day | OPHTHALMIC | Status: DC
  Administered 2023-02-15 – 2023-02-17 (×3): 1 [drp] via OPHTHALMIC
  Filled 2023-02-15: qty 0.1

## 2023-02-15 NOTE — Progress Notes (Signed)
PROGRESS NOTE    Amanda Green  [id number] DOB: [birth year] DOA: 02/13/2023 PCP: Alroy Dust, L.[name], MD    Brief Narrative:   Amanda Green is a 88 y.o. female with past medical history significant for DM2, HTN, history of CVA, paroxysmal atrial fibrillation on Eliquis, cognitive impairment, history of breast cancer s/p right mastectomy 1999, left lumpectomy 2020, left mastectomy 2022 with DCIS who presented to [facility] long ED on 2/28 after sustaining a fall at home.  Patient reported some dizziness, striking the back of her head.  Denies headache, no abdominal pain, no urinary symptoms.   In the ED, temperature 98.0 F, HR 81, RR 16, BP 152/86, SpO2 96% on room air.  WBC 6.8, hemoglobin 13.7, platelets 203.  Sodium 137, potassium 3.5, chloride 103, CO2 25, BUN 12, creatinine 0.92, glucose 170.  CT head without contrast with new vasogenic edema left frontal parietal and temporal lobes with associated sulcal effacement and 4 mm midline shift to the right concerning for underlying mass.  MR brain with and without contrast with 3.2 x 3.2 x 2.8 cm intra-axial enhancing mass centered on the left occipital lobe, concerning for primary CNS neoplasm versus solitary intracranial metastasis, mild 5 mm left to right shift, no other acute intracranial abnormality.  CT chest/abdomen/pelvis with contrast with enhancing left breast mass measuring 3.8 x 2.7 cm and left axillary LAD concerning for malignancy, no evidence of distant metastasis, distended urinary bladder without bladder wall thickening, left adrenal adenoma unchanged from 2007.  Neurosurgery was consulted, Dr. Ronnald Ramp recommended radiation oncology consultation.  TRH consulted for admission and patient was transferred to [facility] for further evaluation and management.  Assessment & Plan:   Left breast mass Brain mass Hx ductal carcinoma in situ (DCIS) Left Breast Patient presenting to ED with dizziness following fall at home.   Imaging notable for left enhancing breast mass measuring 3.8 x 2.7 cm, left axillary LAD and left occipital lobe enhancing mass measuring 3.2 x 3.2 x 2.8 cm with 5 mm left-to-right shift.  Patient with history of breast cancer s/p right mastectomy 1999 followed by left mastectomy 2022.  Previously followed by medical oncology, Dr. Jana Hakim.  Now concern for recurrence of breast cancer with metastasis to the brain. -- Neurosurgery, radiation oncology following, appreciate assistance -- IR consulted for biopsy; pending for today; per IR PA, no need for NPO as there is no plan for sedation -- Holding Eliquis -- Decadron 4 mg PO every 6 hours -- Will need medical oncology consultation once tissue sample/biopsy obtained  Cardiac amyloidosis TTE 2022 with LVEF greater than [id number], grade 1 diastolic dysfunction, severe LVH -- continue tafamidis 61 mg p.o. daily  Type 2 diabetes mellitus On metformin at baseline. -- Hold oral hypoglycemics while inpatient -- Moderate SSI for coverage -- CBG before every meal/at bedtime  Paroxysmal atrial fibrillation Essential hypertension -- Cardizem 180 mg p.o. daily -- Hydrochlorothiazide 12.5 mg p.o. daily -- Holding Eliquis  Hyperlipidemia -- Atorvastatin 80 mg p.o. daily  Cognitive impairment -- Namenda 10 mg p.o. twice daily Supportive care   DVT prophylaxis: Place and maintain sequential compression device Start: 02/14/23 [id number], holding prophylactic chemical DVT prophylaxis given need for biopsy, brain mets with vasogenic edema    Code Status: Full Code Family Communication: No family present at bedside this morning, updated daughter present at bedside yesterday afternoon  Disposition Plan:  Level of care: Med-Surg Status is: Inpatient Remains inpatient appropriate because: Biopsy pending by IR, further plans per specialists  Consultants:  Neurosurgery, Dr. Ronnald Ramp Radiation oncology  Procedures:  Biopsy  Antimicrobials:   None   Subjective: Patient seen examined bedside, resting comfortably.  Lying in bed.  No family present.  Awaiting IR for biopsy today.  No questions or concerns at this time.  Denies headache, no current dizziness, no chest pain, no palpitations, no shortness of breath, no abdominal pain, no fever/chills/night sweats, no nausea cefonicid diarrhea, no focal weakness, no fatigue, no paresthesias.  No acute events overnight per nursing staff.  Objective: Vitals:   02/14/23 1242 02/14/23 2037 02/15/23 0000 02/15/23 0400  BP: (!) 155/92 136/88 131/71 (!) 145/81  Pulse: 96 82 84 81  Resp: (!) '28 19 18 15  '$ Temp: 97.8 F (36.6 C) 97.9 F (36.6 C) 98 F (36.7 C) 98 F (36.7 C)  TempSrc: Oral Oral Oral Oral  SpO2: 95% 97% 94% 95%  Weight: 74.9 kg     Height: '5\' 3"'$  (1.6 m)       Intake/Output Summary (Last 24 hours) at 02/15/2023 1056 Last data filed at 02/14/2023 2100 Gross per 24 hour  Intake --  Output 900 ml  Net -900 ml   Filed Weights   02/13/23 2121 02/14/23 1242  Weight: 71.5 kg 74.9 kg    Examination:  Physical Exam: GEN: NAD, alert and oriented x 3, elderly in appearance HEENT: NCAT, PERRL, EOMI, sclera clear, MMM PULM: CTAB w/o wheezes/crackles, normal respiratory effort, on room air CV: RRR w/o M/G/R GI: abd soft, NTND, NABS, no R/G/M MSK: no peripheral edema, moves all extremity independently NEURO: No focal deficits PSYCH: normal mood/affect Integumentary: dry/intact, no rashes or wounds    Data Reviewed: I have personally reviewed following labs and imaging studies  CBC: Recent Labs  Lab 02/13/23 2205  WBC 6.8  HGB 13.7  HCT 43.2  MCV 91.1  PLT 123456   Basic Metabolic Panel: Recent Labs  Lab 02/13/23 2205  NA 137  K 3.5  CL 103  CO2 25  GLUCOSE 170*  BUN 12  CREATININE 0.92  CALCIUM 10.0   GFR: Estimated Creatinine Clearance: 42.5 mL/min (by C-G formula based on SCr of 0.92 mg/dL). Liver Function Tests: No results for input(s): "AST",  "ALT", "ALKPHOS", "BILITOT", "PROT", "ALBUMIN" in the last 168 hours. No results for input(s): "LIPASE", "AMYLASE" in the last 168 hours. No results for input(s): "AMMONIA" in the last 168 hours. Coagulation Profile: No results for input(s): "INR", "PROTIME" in the last 168 hours. Cardiac Enzymes: No results for input(s): "CKTOTAL", "CKMB", "CKMBINDEX", "TROPONINI" in the last 168 hours. BNP (last 3 results) No results for input(s): "PROBNP" in the last 8760 hours. HbA1C: Recent Labs    02/14/23 0318  HGBA1C 6.5*   CBG: Recent Labs  Lab 02/14/23 0734 02/14/23 1255 02/14/23 1706 02/14/23 2210 02/15/23 0833  GLUCAP 102* 174* 141* 131* 160*   Lipid Profile: No results for input(s): "CHOL", "HDL", "LDLCALC", "TRIG", "CHOLHDL", "LDLDIRECT" in the last 72 hours. Thyroid Function Tests: No results for input(s): "TSH", "T4TOTAL", "FREET4", "T3FREE", "THYROIDAB" in the last 72 hours. Anemia Panel: No results for input(s): "VITAMINB12", "FOLATE", "FERRITIN", "TIBC", "IRON", "RETICCTPCT" in the last 72 hours. Sepsis Labs: No results for input(s): "PROCALCITON", "LATICACIDVEN" in the last 168 hours.  No results found for this or any previous visit (from the past 240 hour(s)).       Radiology Studies: MR BRAIN W WO CONTRAST  Result Date: 02/14/2023 CLINICAL DATA:  Follow-up examination for CNS neoplasm, SRS protocol for treatment planning. EXAM: MRI  HEAD WITHOUT AND WITH CONTRAST TECHNIQUE: Multiplanar, multiecho pulse sequences of the brain and surrounding structures were obtained without and with intravenous contrast. CONTRAST:  7.27m GADAVIST GADOBUTROL 1 MMOL/ML IV SOLN COMPARISON:  MRI from 02/13/2023. FINDINGS: Brain: Previously identified enhancing mass centered at the parasagittal left occipital lobe again seen. Lesion measures 2.9 x 2.9 x 3.1 cm on this exam (AP by transverse by craniocaudad). Surrounding T2/FLAIR signal abnormality, consistent with vasogenic edema and/or  possibly infiltrating nonenhancing tumor. Regional mass effect with partial effacement of the posterior left lateral ventricle. Up to 5 mm of left-to-right shift. Appearance is stable. No other mass lesion or abnormal enhancement. 9 mm nonenhancing nodular focus along the posterior right lateral ventricle again noted, likely a small focus of gray matter heterotopia. No acute or subacute infarct. Underlying atrophy with chronic microvascular ischemic disease again noted. Small remote right cerebellar infarcts. No hydrocephalus or trapping. Basilar cisterns remain patent. No extra-axial fluid collection. Pituitary gland and suprasellar region within normal limits. Vascular: Major intracranial vascular flow voids are maintained. Skull and upper cervical spine: Craniocervical junction within normal limits. Bone marrow signal intensity normal. No focal marrow replacing lesion. No scalp soft tissue abnormality. Sinuses/Orbits: Prior bilateral ocular lens replacement. Paranasal sinuses are clear. Trace left mastoid effusion, the significance. Other: None. IMPRESSION: 1. 2.9 x 2.9 x 3.1 cm enhancing mass centered at the parasagittal left occipital lobe, likely a solitary intracranial metastasis given the history of breast cancer, although a primary CNS neoplasm remains a consideration. Associated regional mass effect with up to 5 mm of left-to-right shift, stable. Examination to be utilized for treatment planning purposes. 2. 9 mm nonenhancing nodular focus along the posterior right lateral ventricle, likely a small focus of gray matter heterotopia. 3. Underlying atrophy with chronic microvascular ischemic disease, with small remote right cerebellar infarcts. Electronically Signed   By: BJeannine BogaM.D.   On: 02/14/2023 20:31   CT CHEST ABDOMEN PELVIS W CONTRAST  Addendum Date: 02/14/2023   ADDENDUM REPORT: 02/14/2023 03:49 ADDENDUM: Left breast surgical changes with enhancing mass and multiple large lymph  nodes in the left axilla, concerning for malignancy. Electronically Signed   By: LBrett FairyM.D.   On: 02/14/2023 03:49   Result Date: 02/14/2023 CLINICAL DATA:  Brain, CNS neoplasm. Staging and brain metastasis. Recent falls. EXAM: CT CHEST, ABDOMEN, AND PELVIS WITH CONTRAST TECHNIQUE: Multidetector CT imaging of the chest, abdomen and pelvis was performed following the standard protocol during bolus administration of intravenous contrast. RADIATION DOSE REDUCTION: This exam was performed according to the departmental dose-optimization program which includes automated exposure control, adjustment of the mA and/or kV according to patient size and/or use of iterative reconstruction technique. CONTRAST:  1026mOMNIPAQUE IOHEXOL 300 MG/ML  SOLN COMPARISON:  02/26/2022, 05/11/2006. FINDINGS: CT CHEST FINDINGS Cardiovascular: The heart is enlarged and there is no pericardial effusion. Multi-vessel coronary artery calcifications are noted. There is atherosclerotic calcification of the aorta without evidence of aneurysm. The pulmonary trunk is normal in caliber. Mediastinum/Nodes: No mediastinal or hilar lymphadenopathy. Enlarged lymph nodes are present in the left axilla. Surgical clips are noted in the right axilla. The thyroid gland, trachea, and esophagus are within normal limits. There is a small hiatal hernia. Lungs/Pleura: Atelectasis is present bilaterally. No effusion or pneumothorax. Musculoskeletal: Mastectomy changes are noted on the right. There is skin thickening over the left breast. An enhancing mass is noted in the upper-outer quadrant of the left breast/axilla measuring 3.8 x 2.7 cm. Degenerative changes in the thoracic  spine. No acute or suspicious osseous abnormality. CT ABDOMEN PELVIS FINDINGS Hepatobiliary: No focal liver abnormality is seen. No gallstones, gallbladder wall thickening, or biliary dilatation. Pancreas: Unremarkable. No pancreatic ductal dilatation or surrounding inflammatory  changes. Spleen: Normal in size. There is a stable hypodensity in the spleen, likely representing cysts or hemangioma. Adrenals/Urinary Tract: There is a stable left adrenal nodule measuring 1.6 cm, previously described as adenoma by MRI. The right adrenal gland is within normal limits. The kidneys enhance symmetrically. No renal calculus or hydronephrosis. The bladder is markedly distended. No bladder wall thickening. Stomach/Bowel: Small hiatal hernia. Stomach is otherwise within normal limits. Right hemicolectomy changes are noted. No evidence of bowel wall thickening, distention, or inflammatory changes. No free air or pneumatosis. Scattered diverticula are present along the colon without evidence of diverticulitis. Vascular/Lymphatic: Aortic atherosclerosis. No enlarged abdominal or pelvic lymph nodes. Reproductive: Status post hysterectomy. No adnexal masses. Other: No abdominopelvic ascites. Fat containing umbilical hernia is noted. Musculoskeletal: Degenerative changes in the lumbar spine. No acute osseous abnormality. IMPRESSION: 1. Enhancing left breast mass and measuring 3.8 x 2.7 cm and left axillary lymphadenopathy, concerning for malignancy. Further evaluation with diagnostic mammography and ultrasound are recommended. 2. No evidence of distant metastasis. 3. Markedly distended urinary bladder with no bladder wall thickening. 4. Left adrenal adenoma, unchanged from 2007. 5. Diverticulosis without diverticulitis. 6. Aortic atherosclerosis and coronary artery calcifications. 7. Remaining incidental findings as described above. Electronically Signed: By: Brett Fairy M.D. On: 02/14/2023 02:41   MR Brain W and Wo Contrast  Result Date: 02/14/2023 CLINICAL DATA:  Initial evaluation for intracranial mass. EXAM: MRI HEAD WITHOUT AND WITH CONTRAST TECHNIQUE: Multiplanar, multiecho pulse sequences of the brain and surrounding structures were obtained without and with intravenous contrast. CONTRAST:  63m  GADAVIST GADOBUTROL 1 MMOL/ML IV SOLN COMPARISON:  Prior CT from earlier the same day. FINDINGS: Brain: Mild age-related cerebral volume loss. Scattered patchy T2/FLAIR hyperintensity involving the periventricular deep white matter both cerebral hemispheres as well as the pons, most consistent with chronic small vessel ischemic disease. Few small remote lacunar infarcts present about the basal ganglia and left thalamus. Small remote right cerebellar infarct noted as well. No evidence for acute or subacute infarct. No areas of chronic cortical infarction. No acute intracranial hemorrhage. Single chronic microhemorrhage noted at the right occipital lobe. Intra-axial avidly enhancing mass centered at the left occipital lobe measures 3.2 x 3.2 x 2.8 cm (AP by craniocaudad by transverse). Associated mild susceptibility artifact noted about the periphery of this lesion. Extensive surrounding T2/FLAIR signal abnormality, which could reflect vasogenic edema and/or infiltrating nonenhancing tumor. Associated mild 5 mm left-to-right shift. No hydrocephalus or trapping. Basilar cisterns remain patent. No other mass or abnormal enhancement. Small nodular focus along the posterior margin of the right lateral ventricle noted, likely a focus of gray matter heterotopia. No extra-axial fluid collection. Pituitary gland and suprasellar region within normal limits. Vascular: Major intracranial vascular flow voids are maintained. Hypoplastic left vertebral artery noted. Skull and upper cervical spine: Craniocervical junction within normal limits. Bone marrow signal intensity normal. No scalp soft tissue abnormality. Sinuses/Orbits: Prior bilateral ocular lens replacement. Paranasal sinuses are largely clear. No mastoid effusion. Other: None. IMPRESSION: 1. 3.2 x 3.2 x 2.8 cm intra-axial enhancing mass centered at the left occipital lobe, corresponding with abnormality on prior CT. Primary differential considerations include a primary  CNS neoplasm versus solitary intracranial metastasis. Surrounding T2/FLAIR signal abnormality could reflect vasogenic edema and/or infiltrating nonenhancing tumor. Associated mild 5 mm  left-to-right shift. 2. No other acute intracranial abnormality. 3. Underlying mild age-related cerebral volume loss with chronic small vessel ischemic disease. Small remote right cerebellar infarct. Electronically Signed   By: Jeannine Boga M.D.   On: 02/14/2023 00:58   CT HEAD WO CONTRAST (5MM)  Result Date: 02/13/2023 CLINICAL DATA:  Trauma. EXAM: CT HEAD WITHOUT CONTRAST TECHNIQUE: Contiguous axial images were obtained from the base of the skull through the vertex without intravenous contrast. RADIATION DOSE REDUCTION: This exam was performed according to the departmental dose-optimization program which includes automated exposure control, adjustment of the mA and/or kV according to patient size and/or use of iterative reconstruction technique. COMPARISON:  Head CT 04/29/2021.  MRI brain 02/20/2021. FINDINGS: Brain: There is new vasogenic edema in the left frontoparietal and temporal region with associated sulcal effacement. There is 4 mm of midline shift to the right. There is no acute intracranial hemorrhage or extra-axial fluid collection. There is some mass effect on the left lateral ventricle. There is no hydrocephalus. Vascular: Atherosclerotic calcifications are present within the cavernous internal carotid arteries. Skull: Normal. Negative for fracture or focal lesion. Sinuses/Orbits: No acute finding. Other: None. IMPRESSION: New vasogenic edema in the left frontoparietal and temporal lobes with associated sulcal effacement and 4 mm of midline shift to the right. Findings are concerning for underlying mass. Contrast-enhanced MRI is recommended for further evaluation. Electronically Signed   By: Ronney Asters M.D.   On: 02/13/2023 22:06        Scheduled Meds:  atorvastatin  80 mg Oral Daily   brimonidine   1 drop Left Eye Q12H   And   timolol  1 drop Left Eye Q12H   calcium carbonate  1,250 mg Oral Q breakfast   dexamethasone  4 mg Oral Q6H   diltiazem  180 mg Oral Daily   dorzolamide  1 drop Left Eye BID   hydrochlorothiazide  12.5 mg Oral Daily   insulin aspart  0-15 Units Subcutaneous TID WC   insulin aspart  0-5 Units Subcutaneous QHS   latanoprost  1 drop Left Eye QHS   memantine  10 mg Oral BID   ofloxacin  1 drop Right Eye QID   prednisoLONE acetate  1 drop Right Eye QID   pregabalin  75 mg Oral TID   Tafamidis  61 mg Oral Daily   Continuous Infusions:   LOS: 1 day    Time spent: 42 minutes spent on chart review, discussion with nursing staff, consultants, updating family and interview/physical exam; more than 50% of that time was spent in counseling and/or coordination of care.    Diannah Rindfleisch J [place] DO [facility] Available via Epic secure chat 7am-7pm After these hours, please refer to coverage provider listed on [email or web address] 02/15/2023, 10:56 AM

## 2023-02-15 NOTE — Transportation (Signed)
[facility] arranged for transport on Monday 03/04/20204. Estimated pickup time is 0930

## 2023-02-15 NOTE — Progress Notes (Signed)
I attempted to reach the patient's daughter, Amanda Green, to share the results of the recent 3T MRI brain scan. She was not available to take the call so I did leave a detailed VM letting her know that the scan did not show evidence of other lesions, just the solitary lesion that we were already aware of and that we will plan to review this in the multidisciplinary brain tumor board on Monday, 02/18/2023 to confirm our treatment plan.  I also advised that we have tentatively scheduled her for CT simulation/treatment planning at 10:30 AM on Monday, 02/18/2023 for the [facility] brain as well as palliative radiation to the left chest wall lesion, pending this is the census agreement at tumor board.  If she will remain inpatient throughout the weekend, we would ask that if she is stable for transfer, she be transferred to [facility] for ease of her radiation oncology appointments and access to medical oncology here at the cancer center.  If she is stable for discharge home over the weekend, we will just plan to coordinate her CT simulation and treatment on an outpatient basis.  She has my contact information and knows that she is welcome to call at anytime with any questions or concerns related to radiotherapy.   Nicholos Johns, MMS, PA-C [place] [facility] [place]: [phone]  Fax: [phone]

## 2023-02-15 NOTE — Plan of Care (Signed)
  Problem: Coping: Goal: Ability to adjust to condition or change in health will improve Outcome: Progressing   Problem: Metabolic: Goal: Ability to maintain appropriate glucose levels will improve Outcome: Progressing   Problem: Skin Integrity: Goal: Risk for impaired skin integrity will decrease Outcome: Progressing   Problem: Tissue Perfusion: Goal: Adequacy of tissue perfusion will improve Outcome: Progressing   Problem: Safety: Goal: Ability to remain free from injury will improve Outcome: Progressing   Problem: Skin Integrity: Goal: Risk for impaired skin integrity will decrease Outcome: Progressing

## 2023-02-15 NOTE — Procedures (Signed)
Vascular and Interventional Radiology Procedure Note  Patient: Amanda Green DOB: 1935/03/21 Medical Record Number: [id number] Note Date/Time: 02/15/23 2:20 PM   Performing Physician: Michaelle Birks, MD Assistant(s): None  Diagnosis: L axillary mass  Procedure: LEFT AXILLARY MASS BIOPSY  Anesthesia: Local Anesthetic Complications: None Estimated Blood Loss: Minimal Specimens: Sent for Pathology  Findings:  Successful Ultrasound-guided biopsy of L axillary mass. A total of 4 samples were obtained. Hemostasis of the tract was achieved using Manual Pressure.  Plan: Bed rest for 0 hours.  See detailed procedure note with images in PACS. The patient tolerated the procedure well without incident or complication and was returned to Floor Bed in stable condition.    Michaelle Birks, MD Vascular [facility] Interventional Radiology [facility] Radiology   Pager. [place]

## 2023-02-16 DIAGNOSIS — D496 Neoplasm of unspecified behavior of brain: Secondary | ICD-10-CM | POA: Diagnosis not present

## 2023-02-16 LAB — GLUCOSE, CAPILLARY
Glucose-Capillary: 192 mg/dL — ABNORMAL HIGH (ref 70–99)
Glucose-Capillary: 252 mg/dL — ABNORMAL HIGH (ref 70–99)
Glucose-Capillary: 230 mg/dL — ABNORMAL HIGH (ref 70–99)
Glucose-Capillary: 231 mg/dL — ABNORMAL HIGH (ref 70–99)

## 2023-02-16 NOTE — Plan of Care (Signed)

## 2023-02-16 NOTE — Progress Notes (Signed)
PROGRESS NOTE    Amanda Green  [id number] DOB: [birth year] DOA: 02/13/2023 PCP: Alroy Dust, L.[name], MD    Brief Narrative:   Amanda Green is a 88 y.o. female with past medical history significant for DM2, HTN, history of CVA, paroxysmal atrial fibrillation on Eliquis, cognitive impairment, history of breast cancer s/p right mastectomy 1999, left lumpectomy 2020, left mastectomy 2022 with DCIS who presented to [facility] long ED on 2/28 after sustaining a fall at home.  Patient reported some dizziness, striking the back of her head.  Denies headache, no abdominal pain, no urinary symptoms.   In the ED, temperature 98.0 F, HR 81, RR 16, BP 152/86, SpO2 96% on room air.  WBC 6.8, hemoglobin 13.7, platelets 203.  Sodium 137, potassium 3.5, chloride 103, CO2 25, BUN 12, creatinine 0.92, glucose 170.  CT head without contrast with new vasogenic edema left frontal parietal and temporal lobes with associated sulcal effacement and 4 mm midline shift to the right concerning for underlying mass.  MR brain with and without contrast with 3.2 x 3.2 x 2.8 cm intra-axial enhancing mass centered on the left occipital lobe, concerning for primary CNS neoplasm versus solitary intracranial metastasis, mild 5 mm left to right shift, no other acute intracranial abnormality.  CT chest/abdomen/pelvis with contrast with enhancing left breast mass measuring 3.8 x 2.7 cm and left axillary LAD concerning for malignancy, no evidence of distant metastasis, distended urinary bladder without bladder wall thickening, left adrenal adenoma unchanged from 2007.  Neurosurgery was consulted, Dr. Ronnald Ramp recommended radiation oncology consultation.  TRH consulted for admission and patient was transferred to [facility] for further evaluation and management.  Assessment & Plan:   Left breast mass Brain mass Hx ductal carcinoma in situ (DCIS) Left Breast Patient presenting to ED with dizziness following fall at home.   Imaging notable for left enhancing breast mass measuring 3.8 x 2.7 cm, left axillary LAD and left occipital lobe enhancing mass measuring 3.2 x 3.2 x 2.8 cm with 5 mm left-to-right shift.  Patient with history of breast cancer s/p right mastectomy 1999 followed by left mastectomy 2022.  Previously followed by medical oncology, Dr. Jana Hakim.  Now concern for recurrence of breast cancer with metastasis to the brain.  Interventional radiology was consulted and patient underwent left axillary mass biopsy on 02/15/2023. -- Neurosurgery, radiation oncology following, appreciate assistance -- Pathology from left axillary mass biopsy: Pending -- Holding Eliquis -- Decadron 4 mg PO every 6 hours -- Plan for CT simulation/treatment on Monday 3/4, pending tumor board review on Monday determine ultimate treatment plan.  If neurosurgery does not have plans for any operative management, potentially can transfer back to [facility] for ease of radiation treatments and access to medical oncology at the cancer center.  Cardiac amyloidosis TTE 2022 with LVEF greater than [id number], grade 1 diastolic dysfunction, severe LVH -- continue tafamidis 61 mg p.o. daily  Type 2 diabetes mellitus On metformin at baseline. -- Hold oral hypoglycemics while inpatient -- Moderate SSI for coverage -- CBG before every meal/at bedtime  Paroxysmal atrial fibrillation Essential hypertension -- Cardizem 180 mg p.o. daily -- Hydrochlorothiazide 12.5 mg p.o. daily -- Holding Eliquis  Hyperlipidemia -- Atorvastatin 80 mg p.o. daily  Cognitive impairment -- Namenda 10 mg p.o. twice daily Supportive care   DVT prophylaxis: Place and maintain sequential compression device Start: 02/14/23 1624SCDs, holding prophylactic chemical DVT prophylaxis given need for biopsy, brain mets with vasogenic edema    Code Status: Full Code  Family Communication: No family present at bedside this morning  Disposition Plan:  Level of care:  Med-Surg Status is: Inpatient Remains inpatient appropriate because:      Consultants:  Neurosurgery, Dr. Ronnald Ramp Radiation oncology  Procedures:  Biopsy  Antimicrobials:  None   Subjective: Patient seen examined bedside, resting comfortably.  Lying in bed.  No family present.  Underwent biopsy of left axillary lymph node by IR yesterday.  Pending radiation treatment on Monday and evaluation at the tumor board to determine ultimate treatment course.  Per neurosurgery, Dr. Ronnald Ramp he has not ruled out surgical intervention at this time.  No questions or concerns at this time.  Denies headache, no current dizziness, no chest pain, no palpitations, no shortness of breath, no abdominal pain, no fever/chills/night sweats, no nausea cefonicid diarrhea, no focal weakness, no fatigue, no paresthesias.  No acute events overnight per nursing staff.  Objective: Vitals:   02/16/23 0600 02/16/23 0700 02/16/23 0800 02/16/23 0852  BP:  111/67 116/71 120/77  Pulse: 73 68 74 74  Resp: 15 15 (!) 28 19  Temp:  98.4 F (36.9 C)  98.4 F (36.9 C)  TempSrc:  Oral    SpO2: 92% 92% 91% 92%  Weight:      Height:        Intake/Output Summary (Last 24 hours) at 02/16/2023 [id number] Last data filed at 02/16/2023 0400 Gross per 24 hour  Intake 250 ml  Output 800 ml  Net -550 ml   Filed Weights   02/13/23 2121 02/14/23 1242  Weight: 71.5 kg 74.9 kg    Examination:  Physical Exam: GEN: NAD, alert and oriented x 3, elderly in appearance HEENT: NCAT, PERRL, EOMI, sclera clear, MMM PULM: CTAB w/o wheezes/crackles, normal respiratory effort, on room air CV: RRR w/o M/G/R GI: abd soft, NTND, NABS, no R/G/M MSK: no peripheral edema, moves all extremity independently NEURO: No focal deficits PSYCH: normal mood/affect Integumentary: dry/intact, no rashes or wounds    Data Reviewed: I have personally reviewed following labs and imaging studies  CBC: Recent Labs  Lab 02/13/23 2205  WBC 6.8  HGB 13.7   HCT 43.2  MCV 91.1  PLT 123456   Basic Metabolic Panel: Recent Labs  Lab 02/13/23 2205  NA 137  K 3.5  CL 103  CO2 25  GLUCOSE 170*  BUN 12  CREATININE 0.92  CALCIUM 10.0   GFR: Estimated Creatinine Clearance: 42.5 mL/min (by C-G formula based on SCr of 0.92 mg/dL). Liver Function Tests: No results for input(s): "AST", "ALT", "ALKPHOS", "BILITOT", "PROT", "ALBUMIN" in the last 168 hours. No results for input(s): "LIPASE", "AMYLASE" in the last 168 hours. No results for input(s): "AMMONIA" in the last 168 hours. Coagulation Profile: No results for input(s): "INR", "PROTIME" in the last 168 hours. Cardiac Enzymes: No results for input(s): "CKTOTAL", "CKMB", "CKMBINDEX", "TROPONINI" in the last 168 hours. BNP (last 3 results) No results for input(s): "PROBNP" in the last 8760 hours. HbA1C: Recent Labs    02/14/23 0318  HGBA1C 6.5*   CBG: Recent Labs  Lab 02/14/23 2210 02/15/23 0833 02/15/23 1538 02/15/23 2150 02/16/23 0746  GLUCAP 131* 160* 257* 183* 192*   Lipid Profile: No results for input(s): "CHOL", "HDL", "LDLCALC", "TRIG", "CHOLHDL", "LDLDIRECT" in the last 72 hours. Thyroid Function Tests: No results for input(s): "TSH", "T4TOTAL", "FREET4", "T3FREE", "THYROIDAB" in the last 72 hours. Anemia Panel: No results for input(s): "VITAMINB12", "FOLATE", "FERRITIN", "TIBC", "IRON", "RETICCTPCT" in the last 72 hours. Sepsis Labs: No results for  input(s): "PROCALCITON", "LATICACIDVEN" in the last 168 hours.  No results found for this or any previous visit (from the past 240 hour(s)).       Radiology Studies: [place] CORE BIOPSY (LYMPH NODES)  Result Date: 02/15/2023 INDICATION: History of breast cancer with LEFT axillary mass. EXAM: ULTRASOUND-GUIDED LEFT AXILLARY MASS BIOPSY COMPARISON:  CT CAP, 02/14/2023. MEDICATIONS: 10 mL lidocaine 1% with epinephrine ANESTHESIA/SEDATION: Local anesthetic was administered. COMPLICATIONS: None immediate. TECHNIQUE: Informed  written consent was obtained from the patient and/or patient's representative after a discussion of the risks, benefits and alternatives to treatment. Questions regarding the procedure were encouraged and answered. Initial ultrasound scanning demonstrated LEFT axillary mass. An ultrasound image was saved for documentation purposes. The procedure was planned. A timeout was performed prior to the initiation of the procedure. The operative was prepped and draped in the usual sterile fashion, and a sterile drape was applied covering the operative field. A timeout was performed prior to the initiation of the procedure. Local anesthesia was provided with 1% lidocaine with epinephrine. Under direct ultrasound guidance, an 18 gauge core needle device was utilized to obtain to obtain 4 core needle biopsies of the LEFT axillary mass. The samples were placed in saline and submitted to pathology. The needle was removed and superficial hemostasis was achieved with manual compression. Post procedure scan was negative for significant hematoma. A dressing was applied. The patient tolerated the procedure well without immediate postprocedural complication. IMPRESSION: Successful ultrasound guided biopsy of LEFT axillary mass. Michaelle Birks, MD [facility] Radiology Electronically Signed   By: Michaelle Birks M.D.   On: 02/15/2023 14:47   MR BRAIN W WO CONTRAST  Result Date: 02/14/2023 CLINICAL DATA:  Follow-up examination for CNS neoplasm, SRS protocol for treatment planning. EXAM: MRI HEAD WITHOUT AND WITH CONTRAST TECHNIQUE: Multiplanar, multiecho pulse sequences of the brain and surrounding structures were obtained without and with intravenous contrast. CONTRAST:  7.21m GADAVIST GADOBUTROL 1 MMOL/ML IV SOLN COMPARISON:  MRI from 02/13/2023. FINDINGS: Brain: Previously identified enhancing mass centered at the parasagittal left occipital lobe again seen. Lesion measures 2.9 x 2.9 x  3.1 cm on this exam (AP by transverse by craniocaudad). Surrounding T2/FLAIR signal abnormality, consistent with vasogenic edema and/or possibly infiltrating nonenhancing tumor. Regional mass effect with partial effacement of the posterior left lateral ventricle. Up to 5 mm of left-to-right shift. Appearance is stable. No other mass lesion or abnormal enhancement. 9 mm nonenhancing nodular focus along the posterior right lateral ventricle again noted, likely a small focus of gray matter heterotopia. No acute or subacute infarct. Underlying atrophy with chronic microvascular ischemic disease again noted. Small remote right cerebellar infarcts. No hydrocephalus or trapping. Basilar cisterns remain patent. No extra-axial fluid collection. Pituitary gland and suprasellar region within normal limits. Vascular: Major intracranial vascular flow voids are maintained. Skull and upper cervical spine: Craniocervical junction within normal limits. Bone marrow signal intensity normal. No focal marrow replacing lesion. No scalp soft tissue abnormality. Sinuses/Orbits: Prior bilateral ocular lens replacement. Paranasal sinuses are clear. Trace left mastoid effusion, the significance. Other: None. IMPRESSION: 1. 2.9 x 2.9 x 3.1 cm enhancing mass centered at the parasagittal left occipital lobe, likely a solitary intracranial metastasis given the history of breast cancer, although a primary CNS neoplasm remains a consideration. Associated regional mass effect with up to 5 mm of left-to-right shift, stable. Examination to be utilized for treatment planning purposes. 2. 9 mm nonenhancing nodular focus along the posterior right lateral ventricle, likely a small  focus of gray matter heterotopia. 3. Underlying atrophy with chronic microvascular ischemic disease, with small remote right cerebellar infarcts. Electronically Signed   By: Jeannine Boga M.D.   On: 02/14/2023 20:31        Scheduled Meds:  atorvastatin  80 mg Oral  Daily   brimonidine-timolol  1 drop Left Eye Q12H   calcium carbonate  1,250 mg Oral Q breakfast   dexamethasone  4 mg Oral Q6H   diltiazem  180 mg Oral Daily   dorzolamide  1 drop Left Eye BID   hydrochlorothiazide  12.5 mg Oral Daily   insulin aspart  0-15 Units Subcutaneous TID WC   insulin aspart  0-5 Units Subcutaneous QHS   memantine  10 mg Oral BID   Netarsudil-Latanoprost  1 drop Left Eye QHS   ofloxacin  1 drop Right Eye QID   prednisoLONE acetate  1 drop Right Eye QID   pregabalin  75 mg Oral TID   Tafamidis  61 mg Oral Daily   Continuous Infusions:   LOS: 2 days    Time spent: 42 minutes spent on chart review, discussion with nursing staff, consultants, updating family and interview/physical exam; more than 50% of that time was spent in counseling and/or coordination of care.    Demaris Bousquet J [place] DO [facility] Available via Epic secure chat 7am-7pm After these hours, please refer to coverage provider listed on [email or web address] 02/16/2023, 9:52 AM

## 2023-02-17 DIAGNOSIS — D496 Neoplasm of unspecified behavior of brain: Secondary | ICD-10-CM | POA: Diagnosis not present

## 2023-02-17 LAB — GLUCOSE, CAPILLARY
Glucose-Capillary: 171 mg/dL — ABNORMAL HIGH (ref 70–99)
Glucose-Capillary: 191 mg/dL — ABNORMAL HIGH (ref 70–99)
Glucose-Capillary: 206 mg/dL — ABNORMAL HIGH (ref 70–99)
Glucose-Capillary: 215 mg/dL — ABNORMAL HIGH (ref 70–99)
Glucose-Capillary: 218 mg/dL — ABNORMAL HIGH (ref 70–99)

## 2023-02-17 MED ORDER — DEXAMETHASONE 4 MG PO TABS
4.0000 mg | ORAL_TABLET | Freq: Two times a day (BID) | ORAL | Status: DC
  Administered 2023-02-17 – 2023-02-18 (×2): 4 mg via ORAL
  Filled 2023-02-17 (×3): qty 1

## 2023-02-17 MED ORDER — INSULIN GLARGINE-YFGN 100 UNIT/ML ~~LOC~~ SOLN
5.0000 [IU] | Freq: Every day | SUBCUTANEOUS | Status: DC
  Administered 2023-02-17: 5 [IU] via SUBCUTANEOUS
  Filled 2023-02-17 (×2): qty 0.05

## 2023-02-17 NOTE — Progress Notes (Signed)
PROGRESS NOTE    Amanda Green  [id number] DOB: 1935/01/24 DOA: 02/13/2023 PCP: Alroy Dust, L.[name], MD    Brief Narrative:   Amanda Green is a 88 y.o. female with past medical history significant for DM2, HTN, history of CVA, paroxysmal atrial fibrillation on Eliquis, cognitive impairment, history of breast cancer s/p right mastectomy 1999, left lumpectomy 2020, left mastectomy 2022 with DCIS who presented to [facility] long ED on 2/28 after sustaining a fall at home.  Patient reported some dizziness, striking the back of her head.  Denies headache, no abdominal pain, no urinary symptoms.   In the ED, temperature 98.0 F, HR 81, RR 16, BP 152/86, SpO2 96% on room air.  WBC 6.8, hemoglobin 13.7, platelets 203.  Sodium 137, potassium 3.5, chloride 103, CO2 25, BUN 12, creatinine 0.92, glucose 170.  CT head without contrast with new vasogenic edema left frontal parietal and temporal lobes with associated sulcal effacement and 4 mm midline shift to the right concerning for underlying mass.  MR brain with and without contrast with 3.2 x 3.2 x 2.8 cm intra-axial enhancing mass centered on the left occipital lobe, concerning for primary CNS neoplasm versus solitary intracranial metastasis, mild 5 mm left to right shift, no other acute intracranial abnormality.  CT chest/abdomen/pelvis with contrast with enhancing left breast mass measuring 3.8 x 2.7 cm and left axillary LAD concerning for malignancy, no evidence of distant metastasis, distended urinary bladder without bladder wall thickening, left adrenal adenoma unchanged from 2007.  Neurosurgery was consulted, Dr. Ronnald Ramp recommended radiation oncology consultation.  TRH consulted for admission and patient was transferred to [facility] for further evaluation and management.  Assessment & Plan:   Left breast mass Brain mass Hx ductal carcinoma in situ (DCIS) Left Breast Patient presenting to ED with dizziness following fall at home.   Imaging notable for left enhancing breast mass measuring 3.8 x 2.7 cm, left axillary LAD and left occipital lobe enhancing mass measuring 3.2 x 3.2 x 2.8 cm with 5 mm left-to-right shift.  Patient with history of breast cancer s/p right mastectomy 1999 followed by left mastectomy 2022.  Previously followed by medical oncology, Dr. Jana Hakim.  Now concern for recurrence of breast cancer with metastasis to the brain.  Interventional radiology was consulted and patient underwent left axillary mass biopsy on 02/15/2023. -- Neurosurgery, radiation oncology following, appreciate assistance -- Pathology from left axillary mass biopsy: Pending -- Holding Eliquis -- Decadron 4 mg PO q12h -- Plan for CT simulation/treatment on Monday 02/18/2023, pending tumor board review on Monday determine ultimate treatment plan.  If neurosurgery does not have plans for any operative management, potentially can transfer back to [facility] for ease of radiation treatments and access to medical oncology at the cancer center.  Cardiac amyloidosis TTE 2022 with LVEF greater than [id number], grade 1 diastolic dysfunction, severe LVH -- continue tafamidis 61 mg p.o. daily  Type 2 diabetes mellitus On metformin at baseline. -- Hold oral hypoglycemics while inpatient -- Moderate SSI for coverage -- CBG before every meal/at bedtime  Paroxysmal atrial fibrillation Essential hypertension -- Cardizem 180 mg p.o. daily -- Hydrochlorothiazide 12.5 mg p.o. daily -- Holding Eliquis  Hyperlipidemia -- Atorvastatin 80 mg p.o. daily  Cognitive impairment -- Namenda 10 mg p.o. twice daily Supportive care   DVT prophylaxis: Place and maintain sequential compression device Start: 02/14/23 1624SCDs, holding prophylactic chemical DVT prophylaxis given need for biopsy, brain mets with vasogenic edema    Code Status: Full Code Family Communication:  No family present at bedside this morning  Disposition Plan:  Level of care:  Med-Surg Status is: Inpatient Remains inpatient appropriate because:      Consultants:  Neurosurgery, Dr. Ronnald Ramp Radiation oncology  Procedures:  Biopsy of left axillary lymph node, IR 02/15/2023  Antimicrobials:  None   Subjective: Patient seen examined bedside, resting comfortably.  Lying in bed.  No family present. Pending radiation treatment tomorrow and evaluation at the tumor board to determine ultimate treatment course.  Per neurosurgery, Dr. Ronnald Ramp; has not ruled out surgical intervention at this time.  No questions or concerns at this time.  Denies headache, no current dizziness, no chest pain, no palpitations, no shortness of breath, no abdominal pain, no fever/chills/night sweats, no nausea cefonicid diarrhea, no focal weakness, no fatigue, no paresthesias.  No acute events overnight per nursing staff.  Objective: Vitals:   02/16/23 1604 02/16/23 1900 02/16/23 2300 02/17/23 0814  BP: (!) 146/69 128/70 (!) 113/57 131/72  Pulse: 64 69 65 63  Resp:   18 19  Temp: 98.4 F (36.9 C) 98.1 F (36.7 C) 98.4 F (36.9 C) 97.9 F (36.6 C)  TempSrc: Oral Oral Oral Oral  SpO2:    95%  Weight:      Height:        Intake/Output Summary (Last 24 hours) at 02/17/2023 0959 Last data filed at 02/17/2023 0400 Gross per 24 hour  Intake 100 ml  Output --  Net 100 ml   Filed Weights   02/13/23 2121 02/14/23 1242  Weight: 71.5 kg 74.9 kg    Examination:  Physical Exam: GEN: NAD, alert and oriented x 3, elderly in appearance HEENT: NCAT, PERRL, EOMI, sclera clear, MMM PULM: CTAB w/o wheezes/crackles, normal respiratory effort, on room air CV: RRR w/o M/G/R GI: abd soft, NTND, NABS, no R/G/M MSK: no peripheral edema, moves all extremity independently NEURO: No focal deficits PSYCH: normal mood/affect Integumentary: dry/intact, no rashes or wounds    Data Reviewed: I have personally reviewed following labs and imaging studies  CBC: Recent Labs  Lab 02/13/23 2205  WBC 6.8   HGB 13.7  HCT 43.2  MCV 91.1  PLT 123456   Basic Metabolic Panel: Recent Labs  Lab 02/13/23 2205  NA 137  K 3.5  CL 103  CO2 25  GLUCOSE 170*  BUN 12  CREATININE 0.92  CALCIUM 10.0   GFR: Estimated Creatinine Clearance: 42.5 mL/min (by C-G formula based on SCr of 0.92 mg/dL). Liver Function Tests: No results for input(s): "AST", "ALT", "ALKPHOS", "BILITOT", "PROT", "ALBUMIN" in the last 168 hours. No results for input(s): "LIPASE", "AMYLASE" in the last 168 hours. No results for input(s): "AMMONIA" in the last 168 hours. Coagulation Profile: No results for input(s): "INR", "PROTIME" in the last 168 hours. Cardiac Enzymes: No results for input(s): "CKTOTAL", "CKMB", "CKMBINDEX", "TROPONINI" in the last 168 hours. BNP (last 3 results) No results for input(s): "PROBNP" in the last 8760 hours. HbA1C: No results for input(s): "HGBA1C" in the last 72 hours.  CBG: Recent Labs  Lab 02/16/23 1218 02/16/23 1643 02/16/23 2146 02/17/23 0030 02/17/23 0856  GLUCAP 252* 230* 231* 215* 171*   Lipid Profile: No results for input(s): "CHOL", "HDL", "LDLCALC", "TRIG", "CHOLHDL", "LDLDIRECT" in the last 72 hours. Thyroid Function Tests: No results for input(s): "TSH", "T4TOTAL", "FREET4", "T3FREE", "THYROIDAB" in the last 72 hours. Anemia Panel: No results for input(s): "VITAMINB12", "FOLATE", "FERRITIN", "TIBC", "IRON", "RETICCTPCT" in the last 72 hours. Sepsis Labs: No results for input(s): "PROCALCITON", "LATICACIDVEN" in  the last 168 hours.  No results found for this or any previous visit (from the past 240 hour(s)).       Radiology Studies: [place] CORE BIOPSY (LYMPH NODES)  Result Date: 02/15/2023 INDICATION: History of breast cancer with LEFT axillary mass. EXAM: ULTRASOUND-GUIDED LEFT AXILLARY MASS BIOPSY COMPARISON:  CT CAP, 02/14/2023. MEDICATIONS: 10 mL lidocaine 1% with epinephrine ANESTHESIA/SEDATION: Local anesthetic was administered. COMPLICATIONS: None immediate.  TECHNIQUE: Informed written consent was obtained from the patient and/or patient's representative after a discussion of the risks, benefits and alternatives to treatment. Questions regarding the procedure were encouraged and answered. Initial ultrasound scanning demonstrated LEFT axillary mass. An ultrasound image was saved for documentation purposes. The procedure was planned. A timeout was performed prior to the initiation of the procedure. The operative was prepped and draped in the usual sterile fashion, and a sterile drape was applied covering the operative field. A timeout was performed prior to the initiation of the procedure. Local anesthesia was provided with 1% lidocaine with epinephrine. Under direct ultrasound guidance, an 18 gauge core needle device was utilized to obtain to obtain 4 core needle biopsies of the LEFT axillary mass. The samples were placed in saline and submitted to pathology. The needle was removed and superficial hemostasis was achieved with manual compression. Post procedure scan was negative for significant hematoma. A dressing was applied. The patient tolerated the procedure well without immediate postprocedural complication. IMPRESSION: Successful ultrasound guided biopsy of LEFT axillary mass. Michaelle Birks, MD [facility] Radiology Electronically Signed   By: Michaelle Birks M.D.   On: 02/15/2023 14:47        Scheduled Meds:  atorvastatin  80 mg Oral Daily   brimonidine-timolol  1 drop Left Eye Q12H   calcium carbonate  1,250 mg Oral Q breakfast   dexamethasone  4 mg Oral Q12H   diltiazem  180 mg Oral Daily   dorzolamide  1 drop Left Eye BID   hydrochlorothiazide  12.5 mg Oral Daily   insulin aspart  0-15 Units Subcutaneous TID WC   insulin aspart  0-5 Units Subcutaneous QHS   insulin glargine-yfgn  5 Units Subcutaneous Daily   memantine  10 mg Oral BID   Netarsudil-Latanoprost  1 drop Left Eye QHS   ofloxacin  1 drop  Right Eye QID   prednisoLONE acetate  1 drop Right Eye QID   pregabalin  75 mg Oral TID   Tafamidis  61 mg Oral Daily   Continuous Infusions:   LOS: 3 days    Time spent: 42 minutes spent on chart review, discussion with nursing staff, consultants, updating family and interview/physical exam; more than 50% of that time was spent in counseling and/or coordination of care.    Sharifa Bucholz J [place] DO [facility] Available via Epic secure chat 7am-7pm After these hours, please refer to coverage provider listed on [email or web address] 02/17/2023, 9:59 AM

## 2023-02-17 NOTE — Plan of Care (Signed)

## 2023-02-18 ENCOUNTER — Telehealth: Payer: Self-pay | Admitting: Radiation Oncology

## 2023-02-18 ENCOUNTER — Telehealth: Payer: Self-pay | Admitting: Hematology and Oncology

## 2023-02-18 ENCOUNTER — Ambulatory Visit
Admit: 2023-02-18 | Discharge: 2023-02-18 | Disposition: A | Discharge status: Home / Self Care | Payer: Medicare PPO | Attending: Radiation Oncology | Admitting: Radiation Oncology

## 2023-02-18 ENCOUNTER — Inpatient Hospital Stay: Payer: Medicare PPO | Attending: Neurological Surgery

## 2023-02-18 ENCOUNTER — Ambulatory Visit
Admission: RE | Admit: 2023-02-18 | Discharge: 2023-02-18 | Disposition: A | Discharge status: Home / Self Care | Payer: Medicare PPO | Source: Ambulatory Visit | Attending: Radiation Oncology | Admitting: Radiation Oncology

## 2023-02-18 DIAGNOSIS — D496 Neoplasm of unspecified behavior of brain: Secondary | ICD-10-CM | POA: Diagnosis not present

## 2023-02-18 DIAGNOSIS — C7931 Secondary malignant neoplasm of brain: Secondary | ICD-10-CM

## 2023-02-18 DIAGNOSIS — Z515 Encounter for palliative care: Secondary | ICD-10-CM | POA: Diagnosis not present

## 2023-02-18 DIAGNOSIS — Z800 Family history of malignant neoplasm of digestive organs: Secondary | ICD-10-CM | POA: Insufficient documentation

## 2023-02-18 DIAGNOSIS — C773 Secondary and unspecified malignant neoplasm of axilla and upper limb lymph nodes: Secondary | ICD-10-CM | POA: Insufficient documentation

## 2023-02-18 DIAGNOSIS — Z171 Estrogen receptor negative status [ER-]: Secondary | ICD-10-CM | POA: Insufficient documentation

## 2023-02-18 DIAGNOSIS — R4189 Other symptoms and signs involving cognitive functions and awareness: Secondary | ICD-10-CM | POA: Diagnosis not present

## 2023-02-18 DIAGNOSIS — C50911 Malignant neoplasm of unspecified site of right female breast: Secondary | ICD-10-CM | POA: Insufficient documentation

## 2023-02-18 DIAGNOSIS — Z510 Encounter for antineoplastic radiation therapy: Secondary | ICD-10-CM | POA: Insufficient documentation

## 2023-02-18 DIAGNOSIS — G939 Disorder of brain, unspecified: Secondary | ICD-10-CM | POA: Insufficient documentation

## 2023-02-18 DIAGNOSIS — C50912 Malignant neoplasm of unspecified site of left female breast: Secondary | ICD-10-CM | POA: Insufficient documentation

## 2023-02-18 DIAGNOSIS — Z7189 Other specified counseling: Secondary | ICD-10-CM

## 2023-02-18 DIAGNOSIS — Z809 Family history of malignant neoplasm, unspecified: Secondary | ICD-10-CM | POA: Insufficient documentation

## 2023-02-18 DIAGNOSIS — R531 Weakness: Secondary | ICD-10-CM

## 2023-02-18 DIAGNOSIS — Z9011 Acquired absence of right breast and nipple: Secondary | ICD-10-CM | POA: Insufficient documentation

## 2023-02-18 LAB — GLUCOSE, CAPILLARY
Glucose-Capillary: 211 mg/dL — ABNORMAL HIGH (ref 70–99)
Glucose-Capillary: 159 mg/dL — ABNORMAL HIGH (ref 70–99)

## 2023-02-18 MED ORDER — INSULIN GLARGINE-YFGN 100 UNIT/ML ~~LOC~~ SOLN
10.0000 [IU] | Freq: Every day | SUBCUTANEOUS | Status: DC
  Administered 2023-02-18: 10 [IU] via SUBCUTANEOUS
  Filled 2023-02-18: qty 0.1

## 2023-02-18 MED ORDER — DEXAMETHASONE 4 MG PO TABS: 4.0000 mg | ORAL_TABLET | Freq: Two times a day (BID) | ORAL | 0 refills | Status: DC

## 2023-02-18 MED ORDER — SODIUM CHLORIDE 0.9% FLUSH
10.0000 mL | Freq: Once | INTRAVENOUS | Status: AC
  Administered 2023-02-18: 10 mL via INTRAVENOUS

## 2023-02-18 NOTE — Consult Note (Signed)
Consultation Note Date: 02/18/2023   Patient Name: Amanda Green  DOB: 07-02-1935  MRN: [id number]  Age / Sex: 88 y.o., female  PCP: Alroy Dust, L.[name], MD Referring Physician: [place] Eric J, DO  Reason for Consultation: Establishing goals of care and Psychosocial/spiritual support  HPI/Patient Profile: 88 y.o. female   admitted on 02/13/2023 with   past medical history significant for DM2, HTN, history of CVA, paroxysmal atrial fibrillation on Eliquis, cognitive impairment, history of breast cancer s/p right mastectomy 1999, left lumpectomy 2020, left mastectomy 2022 with DCIS who presented to [facility] long ED on 2/28 after sustaining a fall at home.    Patient reported some dizziness, striking the back of her head.  Denies headache, no abdominal pain, no urinary symptoms.     CT head without contrast with new vasogenic edema left frontal parietal and temporal lobes with associated sulcal effacement and 4 mm midline shift to the right concerning for underlying mass.   MR brain with and without contrast with 3.2 x 3.2 x 2.8 cm intra-axial enhancing mass centered on the left occipital lobe, concerning for primary CNS neoplasm versus solitary intracranial metastasis, mild 5 mm left to right shift, no other acute intracranial abnormality.   CT chest/abdomen/pelvis with contrast with enhancing left breast mass measuring 3.8 x 2.7 cm and left axillary LAD concerning for malignancy, no evidence of distant metastasis, distended urinary bladder without bladder wall thickening, left adrenal adenoma unchanged from 2007.    Neurosurgery was consulted, Dr. Ronnald Ramp recommended radiation oncology consultation.    Plan is for radiation/ SRS to the brain   Patient and family face treatment option decisions advanced directive decisions and anticipatory care needs     Clinical Assessment and Goals of Care:  This NP Wadie Lessen reviewed medical records, received report from team, assessed the patient and then meet at the patient's bedside with her daughter Amanda Green and Amanda Green/ by phone  to discuss diagnosis, prognosis, GOC, EOL wishes disposition and options.   Concept of Palliative Care was introduced as specialized medical care for people and their families living with serious illness.  If focuses on providing relief from the symptoms and stress of a serious illness.  The goal is to improve quality of life for both the patient and the family.Values and goals of care important to patient and family were attempted to be elicited.   A  discussion was had today regarding advanced directives.  Concepts specific to code status, artifical feeding and hydration, continued IV antibiotics and rehospitalization was had.    The difference between a aggressive medical intervention path  and a palliative comfort care path for this patient at this time was had.     MOST form introduced and left for review, Hard Choices booklet left   Exploration and education regarding anticipatory  care needs in the near future, transportation to and from radiation and increasing personal care needs, anticipating continued physical and functional decline.        Questions and concerns addressed.  Family   encouraged to call with questions or concerns.     PMT will continue to support holistically.    Education offered today regarding  the importance of continued conversation with patient, family and the medical providers regarding overall plan of care and treatment options,  ensuring decisions are within the context of the patients values and GOCs.      No documented HPOA or ACP documents.   Patient's three daughter work together for health care decisions.        SUMMARY OF RECOMMENDATIONS    Code Status/Advance Care Planning: Full code Educated patient/family to consider DNR/DNI status understanding evidenced based  poor outcomes in similar hospitalized patient, as the cause of arrest is likely associated with advanced chronic illness rather than an easily reversible acute cardio-pulmonary event.    Palliative Prophylaxis:  Bowel Regimen, Frequent Pain Assessment, and Oral Care  Additional Recommendations (Limitations, Scope, Preferences): Full Scope Treatment Family and patient anticipate 10 radiation treatments/SRS  Psycho-social/Spiritual:  Desire for further Chaplaincy support:no Additional Recommendations: Education on Hospice  Prognosis:  Unable to determine  Discharge Planning: Home with Home Health      Primary Diagnoses: Present on Admission:  Ductal carcinoma in situ (DCIS) of left breast  Paroxysmal atrial fibrillation (HCC)  Essential hypertension  Neoplasm of brain causing mass effect on adjacent structures [facility])  Cognitive impairment  Cardiac amyloidosis ([place])   I have reviewed the medical record, interviewed the patient and family, and examined the patient. The following aspects are pertinent.  Past Medical History:  Diagnosis Date   Arthritis    knees,   Breast cancer ([place]) 1999   right mastectomy   Breast cancer, left ([place]) 05/2019   left lumpectomy   Diabetes mellitus    Dislocation of left shoulder joint    Family history of prostate cancer    Hyperlipidemia    Hypertension    Memory loss    Numbness and tingling of right lower extremity    Paresthesia    Stroke ([place])    light stroke - 2012 ,right leg nerve damage    Social History   Socioeconomic History   Marital status: Widowed    Spouse name: Not on file   Number of children: 5   Years of education: HS - 12 years   Highest education level: Not on file  Occupational History   Occupation: Retired  Tobacco Use   Smoking status: Never   Smokeless tobacco: Never  Vaping Use   Vaping Use: Never used  Substance and Sexual Activity   Alcohol use: No   Drug use: No   Sexual activity: Not on  file  Other Topics Concern   Not on file  Social History Narrative   Lives at home alone.   Right-handed.   1 cup caffeine daily.   Social Determinants of Health   Financial Resource Strain: Not on file  Food Insecurity: Not on file  Transportation Needs: Not on file  Physical Activity: Not on file  Stress: Not on file  Social Connections: Not on file   Family History  Problem Relation Age of Onset   Esophageal cancer Mother        smoker   Other Father        Unsure of medical history.   Prostate cancer Brother        d. 51s   Cancer Son        ?? unsure, but may have had cancer   Cancer  Son        ?? unsure, but may have had cancer   Scheduled Meds:  atorvastatin  80 mg Oral Daily   brimonidine-timolol  1 drop Left Eye Q12H   calcium carbonate  1,250 mg Oral Q breakfast   dexamethasone  4 mg Oral Q12H   diltiazem  180 mg Oral Daily   dorzolamide  1 drop Left Eye BID   hydrochlorothiazide  12.5 mg Oral Daily   insulin aspart  0-15 Units Subcutaneous TID WC   insulin aspart  0-5 Units Subcutaneous QHS   insulin glargine-yfgn  10 Units Subcutaneous Daily   memantine  10 mg Oral BID   Netarsudil-Latanoprost  1 drop Left Eye QHS   ofloxacin  1 drop Right Eye QID   prednisoLONE acetate  1 drop Right Eye QID   pregabalin  75 mg Oral TID   Tafamidis  61 mg Oral Daily   Continuous Infusions: PRN Meds:.acetaminophen **OR** acetaminophen, ondansetron **OR** ondansetron (ZOFRAN) IV Medications Prior to Admission:  Prior to Admission medications   Medication Sig Start Date End Date Taking? Authorizing Provider  apixaban (ELIQUIS) 5 MG TABS tablet Take 5 mg by mouth 2 (two) times daily. 06/23/21  Yes [provider]  atorvastatin (LIPITOR) 80 MG tablet Take 80 mg by mouth daily.   Yes [provider]  brimonidine-timolol (COMBIGAN) 0.2-0.5 % ophthalmic solution Place 1 drop into the left eye every 12 (twelve) hours.   Yes [provider]  calcium  carbonate (OSCAL) 1500 (600 Ca) MG TABS tablet Take 1,500 mg by mouth daily with breakfast.   Yes [provider]  diltiazem (TIAZAC) 180 MG 24 hr capsule Take 180 mg by mouth daily. 02/13/23  Yes [provider]  dorzolamide (TRUSOPT) 2 % ophthalmic solution Place 1 drop into the left eye 2 (two) times daily. 10/19/20  Yes [provider]  Ferrous Sulfate (IRON PO) Take 1 tablet by mouth daily.   Yes [provider]  hydrochlorothiazide (HYDRODIURIL) 12.5 MG tablet Take 12.5 mg by mouth daily. 01/24/23  Yes [provider]  magnesium 30 MG tablet Take 30 mg by mouth daily.   Yes [provider]  memantine (NAMENDA) 10 MG tablet Take 1 tablet (10 mg total) by mouth 2 (two) times daily. 02/04/23  Yes Frann Rider, NP  metFORMIN (GLUCOPHAGE) 500 MG tablet Take 500 mg by mouth 2 (two) times daily with a meal. 08/18/11  Yes [provider]  ofloxacin (OCUFLOX) 0.3 % ophthalmic solution Place 1 drop into the right eye 4 (four) times daily. 02/06/23  Yes [provider]  prednisoLONE acetate (PRED FORTE) 1 % ophthalmic suspension Place 1 drop into the right eye 4 (four) times daily. 07/18/22  Yes [provider]  pregabalin (LYRICA) 75 MG capsule Take 75 mg by mouth 3 (three) times daily. 02/13/23  Yes [provider]  ROCKLATAN 0.02-0.005 % SOLN Place 1 drop into the left eye at bedtime. 01/31/23  Yes [provider]  Tafamidis (VYNDAMAX) 61 MG CAPS Take 61 mg by mouth daily. 04/27/22  Yes Croitoru, Mihai, MD  vitamin B-12 (CYANOCOBALAMIN) 1000 MCG tablet Take 1,000 mcg by mouth daily.   Yes [provider]  vitamin C (ASCORBIC ACID) 500 MG tablet Take 500 mg by mouth daily.   Yes [provider]   Allergies  Allergen Reactions   Gabapentin     Drowsiness   Review of Systems  Physical Exam  Vital Signs: BP 137/86  Pulse 72   Temp 98.1 F (36.7 C) (Oral)   Resp (!) 22   Ht '5\' 3"'$  (1.6 m)    Wt 74.9 kg   SpO2 94%   BMI 29.25 kg/m  Pain Scale: 0-10   Pain Score: 0-No pain   SpO2: SpO2: 94 % O2 Device:SpO2: 94 % O2 Flow Rate: .   IO: Intake/output summary:  Intake/Output Summary (Last 24 hours) at 02/18/2023 1532 Last data filed at 02/18/2023 1451 Gross per 24 hour  Intake --  Output 300 ml  Net -300 ml    LBM: Last BM Date : 02/13/23 Baseline Weight: Weight: 71.5 kg Most recent weight: Weight: 74.9 kg     Palliative Assessment/Data:  40 %   75 minutes  Detailed review of medical records ( labs, imaging, vital signs), medically appropriate exam ( MS, skin, resp)   discussed with treatment team, counseling and education to patient, family, staff, documenting clinical information, medication management, coordination of care    Signed by: Wadie Lessen, NP   Please contact Palliative Medicine Team phone at [phone] for questions and concerns.  For individual provider: See Shea Evans

## 2023-02-18 NOTE — Evaluation (Signed)
Occupational Therapy Evaluation Patient Details Name: Amanda Green MRN: [id number] DOB: 30-May-1935 Today's Date: 02/18/2023   History of Present Illness 88 y.o. female who presented to [facility] long ED on 2/28 after sustaining a fall at home. Transferred to [facility]; imaging reveled  vasogenic edema left frontal parietal and temporal lobes with associated sulcal effacement and 4 mm midline shift to the right and intra-axial enhancing mass centered on the left occipital lobe. PMHx: DM2, HTN, history of CVA, paroxysmal atrial fibrillation on Eliquis, cognitive impairment, history of breast cancer s/p right mastectomy 1999, left lumpectomy 2020, left mastectomy 2022 with DCIS   Clinical Impression   Amanda Green was evaluated s/p the above admission list. She lives alone, is indep with BADLs and family assists with IADLs at baseline. Upon evaluation she was limited by slowed processing, decreased activity tolerance generalized weakness, R inattention/visual deficits, unsafe environmental navigation and unsteady gait. Overall she required min A for mobility with RW to prevent running into object to her R. She also requires up to min A for ADLs. Pt will benefit from continued acute OT services. Recommend d/c to home with 24/7 direct hands on physical assist and [place], as pt and daughter are eager for pt to return home.       Recommendations for follow up therapy are one component of a multi-disciplinary discharge planning process, led by the attending physician.  Recommendations may be updated based on patient status, additional functional criteria and insurance authorization.   Follow Up Recommendations  Home health OT (pt and pt family eager for pt to return home)     Assistance Recommended at Discharge Frequent or constant Supervision/Assistance  Patient can return home with the following A lot of help with walking and/or transfers;A lot of help with bathing/dressing/bathroom;Assistance with  cooking/housework;Direct supervision/assist for medications management;Direct supervision/assist for financial management;Assist for transportation;Help with stairs or ramp for entrance    Functional Status Assessment  Patient has had a recent decline in their functional status and demonstrates the ability to make significant improvements in function in a reasonable and predictable amount of time.  Equipment Recommendations  None recommended by OT       Precautions / Restrictions Precautions Precautions: Fall Restrictions Weight Bearing Restrictions: No      Mobility Bed Mobility Overal bed mobility: Needs Assistance Bed Mobility: Supine to Sit     Supine to sit: Min guard     General bed mobility comments: HOB elevated    Transfers Overall transfer level: Needs assistance Equipment used: Rolling walker (2 wheels) Transfers: Sit to/from Stand Sit to Stand: Min guard                  Balance Overall balance assessment: Needs assistance Sitting-balance support: Feet supported Sitting balance-Leahy Scale: Good     Standing balance support: Bilateral upper extremity supported, During functional activity Standing balance-Leahy Scale: Poor                             ADL either performed or assessed with clinical judgement   ADL Overall ADL's : Needs assistance/impaired Eating/Feeding: Independent;Sitting   Grooming: Set up;Sitting   Upper Body Bathing: Set up;Sitting   Lower Body Bathing: Minimal assistance;Sit to/from stand   Upper Body Dressing : Set up;Sitting   Lower Body Dressing: Minimal assistance;Sit to/from stand   Toilet Transfer: Minimal assistance;Ambulation;Rolling walker (2 wheels) Toilet Transfer Details (indicate cue type and reason): min A for managment of RW  Toileting- Clothing Manipulation and Hygiene: Min guard;Sitting/lateral lean       Functional mobility during ADLs: Minimal assistance;Rolling walker (2  wheels) General ADL Comments: poor activity tolerance, weakness, unsafe management of RW     Vision Baseline Vision/History: 0 No visual deficits Vision Assessment?: Vision impaired- to be further tested in functional context Additional Comments: would benefit from further assessment, seemingly R inattention vs. cut? running into things on the R with mobility     Perception Perception Perception Tested?: No   Praxis Praxis Praxis tested?: Not tested    Pertinent Vitals/Pain Pain Assessment Pain Assessment: Faces Faces Pain Scale: Hurts a little bit Pain Location: generalized Pain Descriptors / Indicators: Discomfort Pain Intervention(s): Monitored during session, Limited activity within patient's tolerance     Hand Dominance Right   Extremity/Trunk Assessment Upper Extremity Assessment Upper Extremity Assessment: Generalized weakness   Lower Extremity Assessment Lower Extremity Assessment: Defer to PT evaluation   Cervical / Trunk Assessment Cervical / Trunk Assessment: Kyphotic   Communication Communication Communication: No difficulties   Cognition Arousal/Alertness: Awake/alert Behavior During Therapy: WFL for tasks assessed/performed, Flat affect Overall Cognitive Status: Within Functional Limits for tasks assessed                                 General Comments: overall WFL, flat affect, slowed processing, anticipate baseline     General Comments  VSSon RA, daughter present and supportive        Home Living Family/patient expects to be discharged to:: Private residence Living Arrangements: Alone Available Help at Discharge: Family;Available 24 hours/day Type of Home: House Home Access: Ramped entrance     Home Layout: One level     Bathroom Shower/Tub: Walk-in shower         Home Equipment: Conservation officer, nature (2 wheels);Rollator (4 wheels);Cane - single point;Shower seat;BSC/3in1;Wheelchair - manual   Additional Comments: daughter  reports family can provide 24/7 assist if needed      Prior Functioning/Environment Prior Level of Function : Independent/Modified Independent             Mobility Comments: furniture surfs ADLs Comments: mod I for BADLs, family assists with IADLs        OT Problem List: Decreased strength;Decreased range of motion;Decreased activity tolerance      OT Treatment/Interventions: Therapeutic exercise;Self-care/ADL training;DME and/or AE instruction;Therapeutic activities;Patient/family education;Balance training    OT Goals(Current goals can be found in the care plan section) Acute Rehab OT Goals Patient Stated Goal: to go home OT Goal Formulation: With patient Time For Goal Achievement: 03/04/23 Potential to Achieve Goals: Good ADL Goals Pt Will Perform Grooming: with supervision;standing Pt Will Perform Lower Body Dressing: with supervision;sit to/from stand Pt Will Transfer to Toilet: with supervision;ambulating Additional ADL Goal #1: Pt will indep navigate hospital room/environmetn with RW without safety concerns  OT Frequency: Min 2X/week       AM-PAC OT "6 Clicks" Daily Activity     Outcome Measure Help from another person eating meals?: None Help from another person taking care of personal grooming?: A Little Help from another person toileting, which includes using toliet, bedpan, or urinal?: A Little Help from another person bathing (including washing, rinsing, drying)?: A Little Help from another person to put on and taking off regular upper body clothing?: A Little Help from another person to put on and taking off regular lower body clothing?: A Little 6 Click Score: 19   End  of Session Equipment Utilized During Treatment: Gait belt;Rolling walker (2 wheels) Nurse Communication: Mobility status  Activity Tolerance: Patient tolerated treatment well Patient left: in chair;with call bell/phone within reach;with family/visitor present  OT Visit Diagnosis:  Unsteadiness on feet (R26.81);Other abnormalities of gait and mobility (R26.89);Muscle weakness (generalized) (M62.81)                Time: [id number] OT Time Calculation (min): 22 min Charges:  OT General Charges $OT Visit: 1 Visit OT Evaluation $OT Eval Moderate Complexity: Miller, OTR/L [place] [facility] [place] Communication Preferred   Elliot Cousin 02/18/2023, 5:26 PM

## 2023-02-18 NOTE — Progress Notes (Signed)
Has armband been applied?  Yes  Does patient have an allergy to IV contrast dye?: No   Has patient ever received premedication for IV contrast dye?:  n/a  Does patient take metformin?: No  If patient does take metformin when was the last dose: n/a  Date of lab work: 02/13/2023 BUN: 12 CR: 0.92 eGfr: >60  IV site: Right Forearm  Has IV site been added to flowsheet?

## 2023-02-18 NOTE — Discharge Summary (Signed)
Physician Discharge Summary  Amanda Green [id number] DOB: December 04, 1935 DOA: 02/13/2023  PCP: Alroy Dust, L.[name], MD  Admit date: 02/13/2023 Discharge date: 02/18/2023  Admitted From: Home Disposition: Home  Recommendations for Outpatient Follow-up:  Follow up with PCP in 1-2 weeks For with radiation oncology as scheduled, next radiation scheduled for 02/21/2023 Follow-up with medical oncology, Dr. Chryl Heck on 03/01/2023 Continue dexamethasone 4 mg p.o. twice daily Please follow up on the following pending results: Biopsy results from lymph node that was pending at time of discharge Recommend continue goals of care discussion outpatient given breast mass with brain mass concerning for recurrent breast cancer stage IV  Home Health: PT/OT Equipment/Devices: None  Discharge Condition: Stable CODE STATUS: Full code Diet recommendation: Heart healthy/consistent carbohydrate diet  History of present illness:  Amanda Green is a 88 y.o. female with past medical history significant for DM2, HTN, history of CVA, paroxysmal atrial fibrillation on Eliquis, cognitive impairment, history of breast cancer s/p right mastectomy 1999, left lumpectomy 2020, left mastectomy 2022 with DCIS who presented to [facility] long ED on 2/28 after sustaining a fall at home.  Patient reported some dizziness, striking the back of her head.  Denies headache, no abdominal pain, no urinary symptoms.    In the ED, temperature 98.0 F, HR 81, RR 16, BP 152/86, SpO2 96% on room air.  WBC 6.8, hemoglobin 13.7, platelets 203.  Sodium 137, potassium 3.5, chloride 103, CO2 25, BUN 12, creatinine 0.92, glucose 170.  CT head without contrast with new vasogenic edema left frontal parietal and temporal lobes with associated sulcal effacement and 4 mm midline shift to the right concerning for underlying mass.  MR brain with and without contrast with 3.2 x 3.2 x 2.8 cm intra-axial enhancing mass centered on the left occipital lobe,  concerning for primary CNS neoplasm versus solitary intracranial metastasis, mild 5 mm left to right shift, no other acute intracranial abnormality.  CT chest/abdomen/pelvis with contrast with enhancing left breast mass measuring 3.8 x 2.7 cm and left axillary LAD concerning for malignancy, no evidence of distant metastasis, distended urinary bladder without bladder wall thickening, left adrenal adenoma unchanged from 2007.  Neurosurgery was consulted, Dr. Ronnald Ramp recommended radiation oncology consultation.  TRH consulted for admission and patient was transferred to [facility] for further evaluation and management.  Hospital course:  Left breast mass Brain mass Hx ductal carcinoma in situ (DCIS) Left Breast Patient presenting to ED with dizziness following fall at home.  Imaging notable for left enhancing breast mass measuring 3.8 x 2.7 cm, left axillary LAD and left occipital lobe enhancing mass measuring 3.2 x 3.2 x 2.8 cm with 5 mm left-to-right shift.  Patient with history of breast cancer s/p right mastectomy 1999 followed by left mastectomy 2022.  Previously followed by medical oncology, Dr. Jana Hakim.  Now concern for recurrence of breast cancer with metastasis to the brain.  Interventional radiology was consulted and patient underwent left axillary mass biopsy on 02/15/2023.  Neurosurgery, radiation oncology, palliative care was consulted and followed during hospital course.  Outpatient follow-up with neurosurgery, radiation oncology, medical oncology.  Continue Decadron 4 mg p.o. every 12 hours.   Cardiac amyloidosis TTE 2022 with LVEF greater than [id number], grade 1 diastolic dysfunction, severe LVH. Continue tafamidis 61 mg p.o. daily   Type 2 diabetes mellitus Continue metformin   Paroxysmal atrial fibrillation Essential hypertension Cardizem 180 mg p.o. daily, Hydrochlorothiazide 12.5 mg p.o. daily, Eliquis   Hyperlipidemia Atorvastatin 80 mg p.o. daily   Cognitive  impairment Namenda 10 mg p.o. twice daily   Discharge Diagnoses:  Principal Problem:   Neoplasm of brain causing mass effect on adjacent structures [facility]) Active Problems:   Ductal carcinoma in situ (DCIS) of left breast   Cognitive impairment   Paroxysmal atrial fibrillation (HCC)   Essential hypertension   Controlled type 2 diabetes mellitus without complication, without long-term current use of insulin ([place])   Cardiac amyloidosis [facility])    Discharge Instructions  Discharge Instructions     Ambulatory referral to Hematology / Oncology   Complete by: As directed    Previously followed with Dr. Jana Hakim.  Now with recurrence of left breast mass, biopsy pending also with solitary brain lesion.   Call MD for:  difficulty breathing, headache or visual disturbances   Complete by: As directed    Call MD for:  extreme fatigue   Complete by: As directed    Call MD for:  persistant dizziness or light-headedness   Complete by: As directed    Call MD for:  persistant nausea and vomiting   Complete by: As directed    Call MD for:  severe uncontrolled pain   Complete by: As directed    Call MD for:  temperature >100.4   Complete by: As directed    Diet - low sodium heart healthy   Complete by: As directed    Increase activity slowly   Complete by: As directed    No wound care   Complete by: As directed       Allergies as of 02/18/2023       Reactions   Gabapentin    Drowsiness        Medication List     TAKE these medications    apixaban 5 MG Tabs tablet Commonly known as: ELIQUIS Take 5 mg by mouth 2 (two) times daily.   ascorbic acid 500 MG tablet Commonly known as: VITAMIN C Take 500 mg by mouth daily.   atorvastatin 80 MG tablet Commonly known as: LIPITOR Take 80 mg by mouth daily.   brimonidine-timolol 0.2-0.5 % ophthalmic solution Commonly known as: COMBIGAN Place 1 drop into the left eye every 12 (twelve) hours.   calcium carbonate 1500 (600 Ca) MG  Tabs tablet Commonly known as: OSCAL Take 1,500 mg by mouth daily with breakfast.   cyanocobalamin 1000 MCG tablet Commonly known as: VITAMIN B12 Take 1,000 mcg by mouth daily.   dexamethasone 4 MG tablet Commonly known as: DECADRON Take 1 tablet (4 mg total) by mouth every 12 (twelve) hours.   diltiazem 180 MG 24 hr capsule Commonly known as: TIAZAC Take 180 mg by mouth daily.   dorzolamide 2 % ophthalmic solution Commonly known as: TRUSOPT Place 1 drop into the left eye 2 (two) times daily.   hydrochlorothiazide 12.5 MG tablet Commonly known as: HYDRODIURIL Take 12.5 mg by mouth daily.   IRON PO Take 1 tablet by mouth daily.   magnesium 30 MG tablet Take 30 mg by mouth daily.   memantine 10 MG tablet Commonly known as: Namenda Take 1 tablet (10 mg total) by mouth 2 (two) times daily.   metFORMIN 500 MG tablet Commonly known as: GLUCOPHAGE Take 500 mg by mouth 2 (two) times daily with a meal.   ofloxacin 0.3 % ophthalmic solution Commonly known as: OCUFLOX Place 1 drop into the right eye 4 (four) times daily.   prednisoLONE acetate 1 % ophthalmic suspension Commonly known as: PRED FORTE Place 1 drop into the right eye  4 (four) times daily.   pregabalin 75 MG capsule Commonly known as: LYRICA Take 75 mg by mouth 3 (three) times daily.   Rocklatan 0.02-0.005 % Soln Generic drug: Netarsudil-Latanoprost Place 1 drop into the left eye at bedtime.   Vyndamax 61 MG Caps Generic drug: Tafamidis Take 61 mg by mouth daily.        Follow-up Information     Alroy Dust, L.[name], MD. Schedule an appointment as soon as possible for a visit in 1 week(s).   Specialty: Family Medicine Contact information: [address] [place] [address] [phone]         Benay Pike, MD. Go on 03/01/2023.   Specialty: Hematology and Oncology Contact information: [place] [address] [phone]                Allergies   Allergen Reactions   Gabapentin     Drowsiness    Consultations: Neurosurgery, Dr. Ronnald Ramp Radiation oncology Palliative care   Procedures/Studies: [place] CORE BIOPSY (LYMPH NODES)  Result Date: 02/15/2023 INDICATION: History of breast cancer with LEFT axillary mass. EXAM: ULTRASOUND-GUIDED LEFT AXILLARY MASS BIOPSY COMPARISON:  CT CAP, 02/14/2023. MEDICATIONS: 10 mL lidocaine 1% with epinephrine ANESTHESIA/SEDATION: Local anesthetic was administered. COMPLICATIONS: None immediate. TECHNIQUE: Informed written consent was obtained from the patient and/or patient's representative after a discussion of the risks, benefits and alternatives to treatment. Questions regarding the procedure were encouraged and answered. Initial ultrasound scanning demonstrated LEFT axillary mass. An ultrasound image was saved for documentation purposes. The procedure was planned. A timeout was performed prior to the initiation of the procedure. The operative was prepped and draped in the usual sterile fashion, and a sterile drape was applied covering the operative field. A timeout was performed prior to the initiation of the procedure. Local anesthesia was provided with 1% lidocaine with epinephrine. Under direct ultrasound guidance, an 18 gauge core needle device was utilized to obtain to obtain 4 core needle biopsies of the LEFT axillary mass. The samples were placed in saline and submitted to pathology. The needle was removed and superficial hemostasis was achieved with manual compression. Post procedure scan was negative for significant hematoma. A dressing was applied. The patient tolerated the procedure well without immediate postprocedural complication. IMPRESSION: Successful ultrasound guided biopsy of LEFT axillary mass. Michaelle Birks, MD [facility] Electronically Signed   By: Michaelle Birks M.D.   On: 02/15/2023 14:47   MR BRAIN W WO CONTRAST  Result Date:  02/14/2023 CLINICAL DATA:  Follow-up examination for CNS neoplasm, SRS protocol for treatment planning. EXAM: MRI HEAD WITHOUT AND WITH CONTRAST TECHNIQUE: Multiplanar, multiecho pulse sequences of the brain and surrounding structures were obtained without and with intravenous contrast. CONTRAST:  7.68m GADAVIST GADOBUTROL 1 MMOL/ML IV SOLN COMPARISON:  MRI from 02/13/2023. FINDINGS: Brain: Previously identified enhancing mass centered at the parasagittal left occipital lobe again seen. Lesion measures 2.9 x 2.9 x 3.1 cm on this exam (AP by transverse by craniocaudad). Surrounding T2/FLAIR signal abnormality, consistent with vasogenic edema and/or possibly infiltrating nonenhancing tumor. Regional mass effect with partial effacement of the posterior left lateral ventricle. Up to 5 mm of left-to-right shift. Appearance is stable. No other mass lesion or abnormal enhancement. 9 mm nonenhancing nodular focus along the posterior right lateral ventricle again noted, likely a small focus of gray matter heterotopia. No acute or subacute infarct. Underlying atrophy with chronic microvascular ischemic disease again noted. Small remote right cerebellar infarcts. No  hydrocephalus or trapping. Basilar cisterns remain patent. No extra-axial fluid collection. Pituitary gland and suprasellar region within normal limits. Vascular: Major intracranial vascular flow voids are maintained. Skull and upper cervical spine: Craniocervical junction within normal limits. Bone marrow signal intensity normal. No focal marrow replacing lesion. No scalp soft tissue abnormality. Sinuses/Orbits: Prior bilateral ocular lens replacement. Paranasal sinuses are clear. Trace left mastoid effusion, the significance. Other: None. IMPRESSION: 1. 2.9 x 2.9 x 3.1 cm enhancing mass centered at the parasagittal left occipital lobe, likely a solitary intracranial metastasis given the history of breast cancer, although a primary CNS neoplasm remains a  consideration. Associated regional mass effect with up to 5 mm of left-to-right shift, stable. Examination to be utilized for treatment planning purposes. 2. 9 mm nonenhancing nodular focus along the posterior right lateral ventricle, likely a small focus of gray matter heterotopia. 3. Underlying atrophy with chronic microvascular ischemic disease, with small remote right cerebellar infarcts. Electronically Signed   By: Jeannine Boga M.D.   On: 02/14/2023 20:31   CT CHEST ABDOMEN PELVIS W CONTRAST  Addendum Date: 02/14/2023   ADDENDUM REPORT: 02/14/2023 03:49 ADDENDUM: Left breast surgical changes with enhancing mass and multiple large lymph nodes in the left axilla, concerning for malignancy. Electronically Signed   By: Brett Fairy M.D.   On: 02/14/2023 03:49   Result Date: 02/14/2023 CLINICAL DATA:  Brain, CNS neoplasm. Staging and brain metastasis. Recent falls. EXAM: CT CHEST, ABDOMEN, AND PELVIS WITH CONTRAST TECHNIQUE: Multidetector CT imaging of the chest, abdomen and pelvis was performed following the standard protocol during bolus administration of intravenous contrast. RADIATION DOSE REDUCTION: This exam was performed according to the departmental dose-optimization program which includes automated exposure control, adjustment of the mA and/or kV according to patient size and/or use of iterative reconstruction technique. CONTRAST:  125m OMNIPAQUE IOHEXOL 300 MG/ML  SOLN COMPARISON:  02/26/2022, 05/11/2006. FINDINGS: CT CHEST FINDINGS Cardiovascular: The heart is enlarged and there is no pericardial effusion. Multi-vessel coronary artery calcifications are noted. There is atherosclerotic calcification of the aorta without evidence of aneurysm. The pulmonary trunk is normal in caliber. Mediastinum/Nodes: No mediastinal or hilar lymphadenopathy. Enlarged lymph nodes are present in the left axilla. Surgical clips are noted in the right axilla. The thyroid gland, trachea, and esophagus are within  normal limits. There is a small hiatal hernia. Lungs/Pleura: Atelectasis is present bilaterally. No effusion or pneumothorax. Musculoskeletal: Mastectomy changes are noted on the right. There is skin thickening over the left breast. An enhancing mass is noted in the upper-outer quadrant of the left breast/axilla measuring 3.8 x 2.7 cm. Degenerative changes in the thoracic spine. No acute or suspicious osseous abnormality. CT ABDOMEN PELVIS FINDINGS Hepatobiliary: No focal liver abnormality is seen. No gallstones, gallbladder wall thickening, or biliary dilatation. Pancreas: Unremarkable. No pancreatic ductal dilatation or surrounding inflammatory changes. Spleen: Normal in size. There is a stable hypodensity in the spleen, likely representing cysts or hemangioma. Adrenals/Urinary Tract: There is a stable left adrenal nodule measuring 1.6 cm, previously described as adenoma by MRI. The right adrenal gland is within normal limits. The kidneys enhance symmetrically. No renal calculus or hydronephrosis. The bladder is markedly distended. No bladder wall thickening. Stomach/Bowel: Small hiatal hernia. Stomach is otherwise within normal limits. Right hemicolectomy changes are noted. No evidence of bowel wall thickening, distention, or inflammatory changes. No free air or pneumatosis. Scattered diverticula are present along the colon without evidence of diverticulitis. Vascular/Lymphatic: Aortic atherosclerosis. No enlarged abdominal or pelvic lymph nodes. Reproductive: Status post  hysterectomy. No adnexal masses. Other: No abdominopelvic ascites. Fat containing umbilical hernia is noted. Musculoskeletal: Degenerative changes in the lumbar spine. No acute osseous abnormality. IMPRESSION: 1. Enhancing left breast mass and measuring 3.8 x 2.7 cm and left axillary lymphadenopathy, concerning for malignancy. Further evaluation with diagnostic mammography and ultrasound are recommended. 2. No evidence of distant metastasis. 3.  Markedly distended urinary bladder with no bladder wall thickening. 4. Left adrenal adenoma, unchanged from 2007. 5. Diverticulosis without diverticulitis. 6. Aortic atherosclerosis and coronary artery calcifications. 7. Remaining incidental findings as described above. Electronically Signed: By: Brett Fairy M.D. On: 02/14/2023 02:41   MR Brain W and Wo Contrast  Result Date: 02/14/2023 CLINICAL DATA:  Initial evaluation for intracranial mass. EXAM: MRI HEAD WITHOUT AND WITH CONTRAST TECHNIQUE: Multiplanar, multiecho pulse sequences of the brain and surrounding structures were obtained without and with intravenous contrast. CONTRAST:  1m GADAVIST GADOBUTROL 1 MMOL/ML IV SOLN COMPARISON:  Prior CT from earlier the same day. FINDINGS: Brain: Mild age-related cerebral volume loss. Scattered patchy T2/FLAIR hyperintensity involving the periventricular deep white matter both cerebral hemispheres as well as the pons, most consistent with chronic small vessel ischemic disease. Few small remote lacunar infarcts present about the basal ganglia and left thalamus. Small remote right cerebellar infarct noted as well. No evidence for acute or subacute infarct. No areas of chronic cortical infarction. No acute intracranial hemorrhage. Single chronic microhemorrhage noted at the right occipital lobe. Intra-axial avidly enhancing mass centered at the left occipital lobe measures 3.2 x 3.2 x 2.8 cm (AP by craniocaudad by transverse). Associated mild susceptibility artifact noted about the periphery of this lesion. Extensive surrounding T2/FLAIR signal abnormality, which could reflect vasogenic edema and/or infiltrating nonenhancing tumor. Associated mild 5 mm left-to-right shift. No hydrocephalus or trapping. Basilar cisterns remain patent. No other mass or abnormal enhancement. Small nodular focus along the posterior margin of the right lateral ventricle noted, likely a focus of gray matter heterotopia. No extra-axial fluid  collection. Pituitary gland and suprasellar region within normal limits. Vascular: Major intracranial vascular flow voids are maintained. Hypoplastic left vertebral artery noted. Skull and upper cervical spine: Craniocervical junction within normal limits. Bone marrow signal intensity normal. No scalp soft tissue abnormality. Sinuses/Orbits: Prior bilateral ocular lens replacement. Paranasal sinuses are largely clear. No mastoid effusion. Other: None. IMPRESSION: 1. 3.2 x 3.2 x 2.8 cm intra-axial enhancing mass centered at the left occipital lobe, corresponding with abnormality on prior CT. Primary differential considerations include a primary CNS neoplasm versus solitary intracranial metastasis. Surrounding T2/FLAIR signal abnormality could reflect vasogenic edema and/or infiltrating nonenhancing tumor. Associated mild 5 mm left-to-right shift. 2. No other acute intracranial abnormality. 3. Underlying mild age-related cerebral volume loss with chronic small vessel ischemic disease. Small remote right cerebellar infarct. Electronically Signed   By: BJeannine BogaM.D.   On: 02/14/2023 00:58   CT HEAD WO CONTRAST (5MM)  Result Date: 02/13/2023 CLINICAL DATA:  Trauma. EXAM: CT HEAD WITHOUT CONTRAST TECHNIQUE: Contiguous axial images were obtained from the base of the skull through the vertex without intravenous contrast. RADIATION DOSE REDUCTION: This exam was performed according to the departmental dose-optimization program which includes automated exposure control, adjustment of the mA and/or kV according to patient size and/or use of iterative reconstruction technique. COMPARISON:  Head CT 04/29/2021.  MRI brain 02/20/2021. FINDINGS: Brain: There is new vasogenic edema in the left frontoparietal and temporal region with associated sulcal effacement. There is 4 mm of midline shift to the right. There is no  acute intracranial hemorrhage or extra-axial fluid collection. There is some mass effect on the left  lateral ventricle. There is no hydrocephalus. Vascular: Atherosclerotic calcifications are present within the cavernous internal carotid arteries. Skull: Normal. Negative for fracture or focal lesion. Sinuses/Orbits: No acute finding. Other: None. IMPRESSION: New vasogenic edema in the left frontoparietal and temporal lobes with associated sulcal effacement and 4 mm of midline shift to the right. Findings are concerning for underlying mass. Contrast-enhanced MRI is recommended for further evaluation. Electronically Signed   By: Ronney Asters M.D.   On: 02/13/2023 22:06     Subjective: Patient seen examined bedside, resting calmly.  Just completed radiation simulation today with plan to start radiation treatment on Thursday, 02/21/2023.  Seen by palliative care.  Discharging home with outpatient follow-up scheduled with medical oncology.  No other questions or concerns at this time.  Daughter present at bedside.  Denies headache, no visual changes, no chest pain, no shortness of breath, no abdominal pain, no fever/chills/night sweats, no nausea/vomiting/diarrhea, no focal weakness, no fatigue, no paresthesias.  No acute events overnight per nurse staff.  Discharge Exam: Vitals:   02/18/23 0821 02/18/23 1618  BP: 137/86 137/75  Pulse:  68  Resp:  20  Temp:    SpO2:  93%   Vitals:   02/18/23 0408 02/18/23 0800 02/18/23 0821 02/18/23 1618  BP: 131/79 137/86 137/86 137/75  Pulse: 78 72  68  Resp: 20 (!) 22  20  Temp: 98.1 F (36.7 C) 98 F (36.7 C)    TempSrc: Oral     SpO2: 95% 94%  93%  Weight:      Height:        Physical Exam: GEN: NAD, alert and oriented x 3, elderly in appearance HEENT: NCAT, PERRL, EOMI, sclera clear, MMM PULM: CTAB w/o wheezes/crackles, normal respiratory effort, on room air CV: RRR w/o M/G/R GI: abd soft, NTND, NABS, no R/G/M MSK: no peripheral edema, moves all extremities independently NEURO: CN II-XII intact, no focal deficits, sensation to light touch  intact PSYCH: normal mood/affect Integumentary: dry/intact, no rashes or wounds    The results of significant diagnostics from this hospitalization (including imaging, microbiology, ancillary and laboratory) are listed below for reference.     Microbiology: No results found for this or any previous visit (from the past 240 hour(s)).   Labs: BNP (last 3 results) No results for input(s): "BNP" in the last 8760 hours. Basic Metabolic Panel: Recent Labs  Lab 02/13/23 2205  NA 137  K 3.5  CL 103  CO2 25  GLUCOSE 170*  BUN 12  CREATININE 0.92  CALCIUM 10.0   Liver Function Tests: No results for input(s): "AST", "ALT", "ALKPHOS", "BILITOT", "PROT", "ALBUMIN" in the last 168 hours. No results for input(s): "LIPASE", "AMYLASE" in the last 168 hours. No results for input(s): "AMMONIA" in the last 168 hours. CBC: Recent Labs  Lab 02/13/23 2205  WBC 6.8  HGB 13.7  HCT 43.2  MCV 91.1  PLT 203   Cardiac Enzymes: No results for input(s): "CKTOTAL", "CKMB", "CKMBINDEX", "TROPONINI" in the last 168 hours. BNP: Invalid input(s): "POCBNP" CBG: Recent Labs  Lab 02/17/23 1111 02/17/23 1634 02/17/23 2104 02/18/23 0831 02/18/23 1547  GLUCAP 191* 218* 206* 211* 159*   D-Dimer No results for input(s): "DDIMER" in the last 72 hours. Hgb A1c No results for input(s): "HGBA1C" in the last 72 hours. Lipid Profile No results for input(s): "CHOL", "HDL", "LDLCALC", "TRIG", "CHOLHDL", "LDLDIRECT" in the last 72 hours. Thyroid  function studies No results for input(s): "TSH", "T4TOTAL", "T3FREE", "THYROIDAB" in the last 72 hours.  Invalid input(s): "FREET3" Anemia work up No results for input(s): "VITAMINB12", "FOLATE", "FERRITIN", "TIBC", "IRON", "RETICCTPCT" in the last 72 hours. Urinalysis    Component Value Date/Time   COLORURINE YELLOW 04/29/2021 1350   APPEARANCEUR HAZY (A) 04/29/2021 1350   LABSPEC 1.006 04/29/2021 1350   PHURINE 5.0 04/29/2021 1350   GLUCOSEU NEGATIVE  04/29/2021 1350   HGBUR NEGATIVE 04/29/2021 1350   BILIRUBINUR NEGATIVE 04/29/2021 1350   KETONESUR NEGATIVE 04/29/2021 1350   PROTEINUR NEGATIVE 04/29/2021 1350   UROBILINOGEN 0.2 03/23/2010 1156   NITRITE NEGATIVE 04/29/2021 1350   LEUKOCYTESUR MODERATE (A) 04/29/2021 1350   Sepsis Labs Recent Labs  Lab 02/13/23 2205  WBC 6.8   Microbiology No results found for this or any previous visit (from the past 240 hour(s)).   Time coordinating discharge: Over 30 minutes  SIGNED:   Donnamarie Poag [place] DO  [facility] 02/18/2023, 5:34 PM

## 2023-02-18 NOTE — Evaluation (Signed)
Physical Therapy Evaluation Patient Details Name: Amanda Green MRN: [id number] DOB: Apr 07, 1935 Today's Date: 02/18/2023  History of Present Illness  88 y.o. female who presented to [facility] long ED on 2/28 after sustaining a fall at home. Transferred to [facility]; imaging reveled  vasogenic edema left frontal parietal and temporal lobes with associated sulcal effacement and 4 mm midline shift to the right and intra-axial enhancing mass centered on the left occipital lobe. Pt with metastatic breast CA with brain met. 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 Pt and family are eager for pt to return home and are willing/able to provide assistance for pt at home. Instructed pt that she should have assistance with mobility at home. Pt has needed equipment. Recommend HHPT for further therapy.        Recommendations for follow up therapy are one component of a multi-disciplinary discharge planning process, led by the attending physician.  Recommendations may be updated based on patient status, additional functional criteria and insurance authorization.  Follow Up Recommendations Home health PT      Assistance Recommended at Discharge Frequent or constant Supervision/Assistance  Patient can return home with the following  A little help with walking and/or transfers;A little help with bathing/dressing/bathroom;Assistance with cooking/housework;Help with stairs or ramp for entrance;Assist for transportation    Equipment Recommendations None recommended by PT  Recommendations for Other Services       Functional Status Assessment Patient has had a recent decline in their functional status and  demonstrates the ability to make significant improvements in function in a reasonable and predictable amount of time.     Precautions / Restrictions Precautions Precautions: Fall Restrictions Weight Bearing Restrictions: No      Mobility  Bed Mobility               General bed mobility comments: Pt up in chair    Transfers Overall transfer level: Needs assistance Equipment used: Rolling walker (2 wheels) Transfers: Sit to/from Stand Sit to Stand: Min guard           General transfer comment: Assist for safety and verbal cues for hand placement    Ambulation/Gait Ambulation/Gait assistance: Min assist Gait Distance (Feet): 35 Feet Assistive device: Rolling walker (2 wheels) Gait Pattern/deviations: Step-through pattern, Decreased step length - right, Decreased step length - left, Trunk flexed, Knee flexed in stance - left, Knee flexed in stance - right Gait velocity: decr Gait velocity interpretation: <1.31 ft/sec, indicative of household ambulator   General Gait Details: Assist for balance and support. Pt staying to rt side of walker throughout. Mild rt inattention to obstacles  Science writer    Modified Rankin (Stroke Patients Only)       Balance Overall balance assessment: Needs assistance Sitting-balance support: Feet supported Sitting balance-Leahy Scale: Good     Standing balance support: Bilateral upper extremity supported, During functional activity Standing balance-Leahy Scale: Poor Standing balance comment: walker and min guard for static standing                             Pertinent Vitals/Pain Pain Assessment Pain  Assessment: Faces Faces Pain Scale: Hurts a little bit Pain Location: generalized Pain Descriptors / Indicators: Discomfort Pain Intervention(s): Monitored during session    Home Living Family/patient expects to be discharged to:: Private residence Living Arrangements:  Alone Available Help at Discharge: Family;Available 24 hours/day Type of Home: House Home Access: Ramped entrance       Home Layout: One level Home Equipment: Conservation officer, nature (2 wheels);Rollator (4 wheels);Cane - single point;Shower seat;BSC/3in1;Wheelchair - manual Additional Comments: daughter reports family can provide 24/7 assist if needed    Prior Function Prior Level of Function : Independent/Modified Independent             Mobility Comments: furniture surfs ADLs Comments: mod I for BADLs, family assists with IADLs     Hand Dominance   Dominant Hand: Right    Extremity/Trunk Assessment   Upper Extremity Assessment Upper Extremity Assessment: Defer to OT evaluation    Lower Extremity Assessment Lower Extremity Assessment: Generalized weakness    Cervical / Trunk Assessment Cervical / Trunk Assessment: Kyphotic  Communication   Communication: No difficulties  Cognition Arousal/Alertness: Awake/alert Behavior During Therapy: WFL for tasks assessed/performed Overall Cognitive Status: Within Functional Limits for tasks assessed                                          General Comments General comments (skin integrity, edema, etc.): VSS on RA, daughter and granddaughter present    Exercises     Assessment/Plan    PT Assessment All further PT needs can be met in the next venue of care  PT Problem List Decreased strength;Decreased activity tolerance;Decreased balance;Decreased mobility       PT Treatment Interventions      PT Goals (Current goals can be found in the Care Plan section)  Acute Rehab PT Goals Patient Stated Goal: go home PT Goal Formulation: All assessment and education complete, DC therapy    Frequency       Co-evaluation               AM-PAC PT "6 Clicks" Mobility  Outcome Measure Help needed turning from your back to your side while in a flat bed without using bedrails?: A Little Help needed moving from  lying on your back to sitting on the side of a flat bed without using bedrails?: A Little Help needed moving to and from a bed to a chair (including a wheelchair)?: A Little Help needed standing up from a chair using your arms (e.g., wheelchair or bedside chair)?: A Little Help needed to walk in hospital room?: A Little Help needed climbing 3-5 steps with a railing? : A Lot 6 Click Score: 17    End of Session Equipment Utilized During Treatment: Gait belt Activity Tolerance: Patient tolerated treatment well Patient left: in chair;with call bell/phone within reach;with family/visitor present   PT Visit Diagnosis: Unsteadiness on feet (R26.81);Other abnormalities of gait and mobility (R26.89);Muscle weakness (generalized) (M62.81);History of falling (Z91.81)    Time: 1731-1750 PT Time Calculation (min) (ACUTE ONLY): 19 min   Charges:   PT Evaluation $PT Eval Low Complexity: Christopher Office [place] 02/18/2023, 5:58 PM

## 2023-02-18 NOTE — Progress Notes (Signed)
  Transition of Care [facility]) Screening Note   Patient Details  Name: Amanda Green Date of Birth: [birth year]   Transition of Care [facility]) CM/SW Contact:    Benard Halsted, LCSW Phone Number: 02/18/2023, 8:54 AM    Transition of Care Department [facility]) has reviewed patient. We will continue to monitor patient advancement through interdisciplinary progression rounds. If new patient transition needs arise, please place a TOC consult.

## 2023-02-18 NOTE — Telephone Encounter (Signed)
Scheduled appt per 3/4 referral. Called pt, no answer. Left msg with appt date/time. Requested for pt to call back to confirm appt.

## 2023-02-18 NOTE — Progress Notes (Signed)
PROGRESS NOTE    Amanda Green  [id number] DOB: 25-Mar-1935 DOA: 02/13/2023 PCP: Alroy Dust, L.[name], MD    Brief Narrative:   Amanda Green is a 88 y.o. 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  Patient reported some dizziness, striking the back of her head.  Denies headache, no abdominal pain, no urinary symptoms.   In the ED, temperature 98.0 F, HR 81, RR 16, BP 152/86, SpO2 96% on room air.  WBC 6.8, hemoglobin 13.7, platelets 203.  Sodium 137, potassium 3.5, chloride 103, CO2 25, BUN 12, creatinine 0.92, glucose 170.  CT head without contrast with new vasogenic edema left frontal parietal and temporal lobes with associated sulcal effacement and 4 mm midline shift to the right concerning for underlying mass.  MR brain with and without contrast with 3.2 x 3.2 x 2.8 cm intra-axial enhancing mass centered on the left occipital lobe, concerning for primary CNS neoplasm versus solitary intracranial metastasis, mild 5 mm left to right shift, no other acute intracranial abnormality.  CT chest/abdomen/pelvis with contrast with enhancing left breast mass measuring 3.8 x 2.7 cm and left axillary LAD concerning for malignancy, no evidence of distant metastasis, distended urinary bladder without bladder wall thickening, left adrenal adenoma unchanged from 2007.  Neurosurgery was consulted, Dr. Ronnald Ramp recommended radiation oncology consultation.  TRH consulted for admission and patient was transferred to [facility] for further evaluation and management.  Assessment & Plan:   Left breast mass Brain mass Hx ductal carcinoma in situ (DCIS) Left Breast Patient presenting to ED with dizziness following fall at home.   Imaging notable for left enhancing breast mass measuring 3.8 x 2.7 cm, left axillary LAD and left occipital lobe enhancing mass measuring 3.2 x 3.2 x 2.8 cm with 5 mm left-to-right shift.  Patient with history of breast cancer s/p right mastectomy 1999 followed by left mastectomy 2022.  Previously followed by medical oncology, Dr. Jana Hakim.  Now concern for recurrence of breast cancer with metastasis to the brain.  Interventional radiology was consulted and patient underwent left axillary mass biopsy on 02/15/2023. -- Neurosurgery, radiation oncology following, appreciate assistance -- Pathology from left axillary mass biopsy: Pending -- Holding Eliquis -- Decadron 4 mg PO q12h -- Plan for CT simulation/treatment today, tumor board review also performed today with plan of SRS treatment in 3 fractions, per radiation oncology PA, surgical intervention by neurosurgery is not advised at this time.   -- Ambulatory referral placed to medical oncology for continued outpatient follow-up given recurrence of what appears to be left breast cancer with brain metastasis.  Cardiac amyloidosis TTE 2022 with LVEF greater than [id number], grade 1 diastolic dysfunction, severe LVH -- continue tafamidis 61 mg p.o. daily  Type 2 diabetes mellitus On metformin at baseline. -- Hold oral hypoglycemics while inpatient -- Semglee 10 units subcutaneously daily -- Moderate SSI for coverage -- CBG before every meal/at bedtime  Paroxysmal atrial fibrillation Essential hypertension -- Cardizem 180 mg p.o. daily -- Hydrochlorothiazide 12.5 mg p.o. daily -- Holding Eliquis  Hyperlipidemia -- Atorvastatin 80 mg p.o. daily  Cognitive impairment -- Namenda 10 mg p.o. twice daily Supportive care   DVT prophylaxis: Place and maintain sequential compression device Start: 02/14/23 1624SCDs, holding prophylactic chemical DVT prophylaxis given need  for biopsy, brain mets with vasogenic edema    Code Status: Full Code Family  Communication: No family present at bedside this morning  Disposition Plan:  Level of care: Med-Surg Status is: Inpatient Remains inpatient appropriate because:      Consultants:  Neurosurgery, Dr. Ronnald Ramp Radiation oncology  Procedures:  Biopsy of left axillary lymph node, IR 02/15/2023  Antimicrobials:  None   Subjective: Patient seen examined bedside, resting comfortably.  Lying in bed.  No family present.  Patient will be going for radiation simulation today and per radiation oncology PA neurosurgery not planning on surgical invention at this time.  No questions or concerns at this time.  Denies headache, no current dizziness, no chest pain, no palpitations, no shortness of breath, no abdominal pain, no fever/chills/night sweats, no nausea cefonicid diarrhea, no focal weakness, no fatigue, no paresthesias.  No acute events overnight per nursing staff.  Objective: Vitals:   02/17/23 2300 02/18/23 0408 02/18/23 0800 02/18/23 0821  BP: 134/77 131/79 137/86 137/86  Pulse: 63 78 72   Resp: 18 20 (!) 22   Temp: 98 F (36.7 C) 98.1 F (36.7 C)    TempSrc: Oral Oral    SpO2: 95% 95% 94%   Weight:      Height:       No intake or output data in the 24 hours ending 02/18/23 1102  Filed Weights   02/13/23 2121 02/14/23 1242  Weight: 71.5 kg 74.9 kg    Examination:  Physical Exam: GEN: NAD, alert and oriented x 3, elderly in appearance HEENT: NCAT, PERRL, EOMI, sclera clear, MMM PULM: CTAB w/o wheezes/crackles, normal respiratory effort, on room air CV: RRR w/o M/G/R GI: abd soft, NTND, NABS, no R/G/M MSK: no peripheral edema, moves all extremity independently NEURO: No focal deficits PSYCH: normal mood/affect Integumentary: dry/intact, no rashes or wounds    Data Reviewed: I have personally reviewed following labs and imaging studies  CBC: Recent Labs  Lab 02/13/23 2205  WBC 6.8  HGB 13.7  HCT 43.2  MCV 91.1  PLT 123456   Basic Metabolic Panel: Recent Labs   Lab 02/13/23 2205  NA 137  K 3.5  CL 103  CO2 25  GLUCOSE 170*  BUN 12  CREATININE 0.92  CALCIUM 10.0   GFR: Estimated Creatinine Clearance: 42.5 mL/min (by C-G formula based on SCr of 0.92 mg/dL). Liver Function Tests: No results for input(s): "AST", "ALT", "ALKPHOS", "BILITOT", "PROT", "ALBUMIN" in the last 168 hours. No results for input(s): "LIPASE", "AMYLASE" in the last 168 hours. No results for input(s): "AMMONIA" in the last 168 hours. Coagulation Profile: No results for input(s): "INR", "PROTIME" in the last 168 hours. Cardiac Enzymes: No results for input(s): "CKTOTAL", "CKMB", "CKMBINDEX", "TROPONINI" in the last 168 hours. BNP (last 3 results) No results for input(s): "PROBNP" in the last 8760 hours. HbA1C: No results for input(s): "HGBA1C" in the last 72 hours.  CBG: Recent Labs  Lab 02/17/23 0856 02/17/23 1111 02/17/23 1634 02/17/23 2104 02/18/23 0831  GLUCAP 171* 191* 218* 206* 211*   Lipid Profile: No results for input(s): "CHOL", "HDL", "LDLCALC", "TRIG", "CHOLHDL", "LDLDIRECT" in the last 72 hours. Thyroid Function Tests: No results for input(s): "TSH", "T4TOTAL", "FREET4", "T3FREE", "THYROIDAB" in the last 72 hours. Anemia Panel: No results for input(s): "VITAMINB12", "FOLATE", "FERRITIN", "TIBC", "IRON", "RETICCTPCT" in the last 72 hours. Sepsis Labs: No results for input(s): "PROCALCITON", "LATICACIDVEN" in the last 168 hours.  No results found for this or any previous visit (from the  past 240 hour(s)).       Radiology Studies: No results found.      Scheduled Meds:  atorvastatin  80 mg Oral Daily   brimonidine-timolol  1 drop Left Eye Q12H   calcium carbonate  1,250 mg Oral Q breakfast   dexamethasone  4 mg Oral Q12H   diltiazem  180 mg Oral Daily   dorzolamide  1 drop Left Eye BID   hydrochlorothiazide  12.5 mg Oral Daily   insulin aspart  0-15 Units Subcutaneous TID WC   insulin aspart  0-5 Units Subcutaneous QHS   insulin  glargine-yfgn  10 Units Subcutaneous Daily   memantine  10 mg Oral BID   Netarsudil-Latanoprost  1 drop Left Eye QHS   ofloxacin  1 drop Right Eye QID   prednisoLONE acetate  1 drop Right Eye QID   pregabalin  75 mg Oral TID   Tafamidis  61 mg Oral Daily   Continuous Infusions:   LOS: 4 days    Time spent: 42 minutes spent on chart review, discussion with nursing staff, consultants, updating family and interview/physical exam; more than 50% of that time was spent in counseling and/or coordination of care.    Intisar Claudio J [place] DO [facility] Available via Epic secure chat 7am-7pm After these hours, please refer to coverage provider listed on [email or web address] 02/18/2023, 11:02 AM

## 2023-02-18 NOTE — Telephone Encounter (Signed)
I called and spoke with the patient's daughter [name] and reviewed the discussion in brain and spine oncology conference this morning, as well as plans for fractionated SRS to the brain. She will also have a biopsy resulting from her procedure in the axilla last week, and Dr. Lisbeth Renshaw anticipates palliative radiation to the axilla as well. She will be carelinked over for simulation this morning, and we will proceed with treatment also with Dr. Ronnald Ramp as a part of her team for [facility] treatment in 3 fractions.

## 2023-02-19 ENCOUNTER — Telehealth: Payer: Self-pay | Admitting: Hematology and Oncology

## 2023-02-19 DIAGNOSIS — C50912 Malignant neoplasm of unspecified site of left female breast: Secondary | ICD-10-CM | POA: Diagnosis not present

## 2023-02-19 DIAGNOSIS — C7931 Secondary malignant neoplasm of brain: Secondary | ICD-10-CM | POA: Diagnosis not present

## 2023-02-19 NOTE — Telephone Encounter (Signed)
Pt's daughter called in to confirm appt with Dr. Chryl Heck.

## 2023-02-20 DIAGNOSIS — C7931 Secondary malignant neoplasm of brain: Secondary | ICD-10-CM | POA: Diagnosis not present

## 2023-02-20 DIAGNOSIS — C50912 Malignant neoplasm of unspecified site of left female breast: Secondary | ICD-10-CM | POA: Diagnosis not present

## 2023-02-20 DIAGNOSIS — Z510 Encounter for antineoplastic radiation therapy: Secondary | ICD-10-CM | POA: Diagnosis not present

## 2023-02-20 DIAGNOSIS — C773 Secondary and unspecified malignant neoplasm of axilla and upper limb lymph nodes: Secondary | ICD-10-CM | POA: Diagnosis not present

## 2023-02-20 DIAGNOSIS — C50911 Malignant neoplasm of unspecified site of right female breast: Secondary | ICD-10-CM | POA: Diagnosis not present

## 2023-02-21 ENCOUNTER — Other Ambulatory Visit: Payer: Self-pay

## 2023-02-21 ENCOUNTER — Ambulatory Visit
Admission: RE | Admit: 2023-02-21 | Discharge: 2023-02-21 | Disposition: A | Discharge status: Home / Self Care | Payer: Medicare PPO | Source: Ambulatory Visit | Attending: Radiation Oncology | Admitting: Radiation Oncology

## 2023-02-21 DIAGNOSIS — C7931 Secondary malignant neoplasm of brain: Secondary | ICD-10-CM | POA: Diagnosis not present

## 2023-02-21 DIAGNOSIS — Z510 Encounter for antineoplastic radiation therapy: Secondary | ICD-10-CM | POA: Diagnosis not present

## 2023-02-21 DIAGNOSIS — C50911 Malignant neoplasm of unspecified site of right female breast: Secondary | ICD-10-CM | POA: Diagnosis not present

## 2023-02-21 DIAGNOSIS — C773 Secondary and unspecified malignant neoplasm of axilla and upper limb lymph nodes: Secondary | ICD-10-CM | POA: Diagnosis not present

## 2023-02-21 DIAGNOSIS — C50912 Malignant neoplasm of unspecified site of left female breast: Secondary | ICD-10-CM | POA: Diagnosis not present

## 2023-02-21 LAB — RAD ONC ARIA SESSION SUMMARY
Course Elapsed Days: 0
Plan Fractions Treated to Date: 1
Plan Prescribed Dose Per Fraction: 3 Gy
Plan Total Fractions Prescribed: 10
Plan Total Prescribed Dose: 30 Gy
Reference Point Dosage Given to Date: 3 Gy
Reference Point Session Dosage Given: 3 Gy
Session Number: 1

## 2023-02-22 ENCOUNTER — Ambulatory Visit
Admission: RE | Admit: 2023-02-22 | Discharge: 2023-02-22 | Disposition: A | Discharge status: Home / Self Care | Payer: Medicare PPO | Source: Ambulatory Visit | Attending: Radiation Oncology | Admitting: Radiation Oncology

## 2023-02-22 ENCOUNTER — Other Ambulatory Visit: Payer: Self-pay

## 2023-02-22 DIAGNOSIS — C773 Secondary and unspecified malignant neoplasm of axilla and upper limb lymph nodes: Secondary | ICD-10-CM | POA: Diagnosis not present

## 2023-02-22 DIAGNOSIS — C50911 Malignant neoplasm of unspecified site of right female breast: Secondary | ICD-10-CM | POA: Diagnosis not present

## 2023-02-22 DIAGNOSIS — Z510 Encounter for antineoplastic radiation therapy: Secondary | ICD-10-CM | POA: Diagnosis not present

## 2023-02-22 DIAGNOSIS — C50912 Malignant neoplasm of unspecified site of left female breast: Secondary | ICD-10-CM | POA: Diagnosis not present

## 2023-02-22 DIAGNOSIS — C7931 Secondary malignant neoplasm of brain: Secondary | ICD-10-CM | POA: Diagnosis not present

## 2023-02-22 LAB — RAD ONC ARIA SESSION SUMMARY
Course Elapsed Days: 1
Plan Fractions Treated to Date: 2
Plan Prescribed Dose Per Fraction: 3 Gy
Plan Total Fractions Prescribed: 10
Plan Total Prescribed Dose: 30 Gy
Reference Point Dosage Given to Date: 6 Gy
Reference Point Session Dosage Given: 3 Gy
Session Number: 2

## 2023-02-23 ENCOUNTER — Emergency Department (HOSPITAL_COMMUNITY)
Admission: EM | Admit: 2023-02-23 | Discharge: 2023-02-24 | Disposition: A | Discharge status: Home / Self Care | Payer: Medicare PPO | Attending: Emergency Medicine | Admitting: Emergency Medicine

## 2023-02-23 ENCOUNTER — Other Ambulatory Visit: Payer: Self-pay

## 2023-02-23 ENCOUNTER — Encounter (HOSPITAL_COMMUNITY): Payer: Self-pay

## 2023-02-23 DIAGNOSIS — Z794 Long term (current) use of insulin: Secondary | ICD-10-CM | POA: Insufficient documentation

## 2023-02-23 DIAGNOSIS — R739 Hyperglycemia, unspecified: Secondary | ICD-10-CM

## 2023-02-23 DIAGNOSIS — Z79899 Other long term (current) drug therapy: Secondary | ICD-10-CM | POA: Insufficient documentation

## 2023-02-23 DIAGNOSIS — E1165 Type 2 diabetes mellitus with hyperglycemia: Secondary | ICD-10-CM | POA: Insufficient documentation

## 2023-02-23 DIAGNOSIS — Z7901 Long term (current) use of anticoagulants: Secondary | ICD-10-CM | POA: Insufficient documentation

## 2023-02-23 DIAGNOSIS — N289 Disorder of kidney and ureter, unspecified: Secondary | ICD-10-CM | POA: Diagnosis not present

## 2023-02-23 DIAGNOSIS — Z7984 Long term (current) use of oral hypoglycemic drugs: Secondary | ICD-10-CM | POA: Diagnosis not present

## 2023-02-23 LAB — CBC
HCT: 42.6 % (ref 36.0–46.0)
Hemoglobin: 14 g/dL (ref 12.0–15.0)
MCH: 29.4 pg (ref 26.0–34.0)
MCHC: 32.9 g/dL (ref 30.0–36.0)
MCV: 89.5 fL (ref 80.0–100.0)
Platelets: 248 10*3/uL (ref 150–400)
RBC: 4.76 MIL/uL (ref 3.87–5.11)
RDW: 16.8 % — ABNORMAL HIGH (ref 11.5–15.5)
WBC: 13.4 10*3/uL — ABNORMAL HIGH (ref 4.0–10.5)
nRBC: 0 % (ref 0.0–0.2)

## 2023-02-23 LAB — CBG MONITORING, ED: Glucose-Capillary: 321 mg/dL — ABNORMAL HIGH (ref 70–99)

## 2023-02-23 NOTE — ED Triage Notes (Addendum)
Pt recently placed on dexamethasone presents today with CBG in the 300's at home. Pt denies any associated symptoms at this time. Pt is disoriented to time which is baseline for the pt.

## 2023-02-23 NOTE — ED Provider Triage Note (Signed)
Emergency Medicine Provider Triage Evaluation Note  Amanda Green , a 88 y.o. female  was evaluated in triage.  Pt complains of high blood sugar.  Patient daughter states patient recently placed on dexamethasone for milligram twice daily for tumors in body.  Patient reports she began taking these on Tuesday of this past week.  Patient reports that her blood sugars been uncontrolled at home.  Patient denies nausea, vomiting, fevers, abdominal pain, chest pain, shortness of breath.  Patient denies 3 P's  Review of Systems  Positive:  Negative:   Physical Exam  BP 130/67 (BP Location: Right Arm)   Pulse 74   Temp 97.8 F (36.6 C) (Oral)   Resp 18   Ht '5\' 3"'$  (1.6 m)   Wt 74.8 kg   SpO2 98%   BMI 29.23 kg/m  Gen:   Awake, no distress   Resp:  Normal effort  MSK:   Moves extremities without difficulty  Other:    Medical Decision Making  Medically screening exam initiated at 9:41 PM.  Appropriate orders placed.  [name] was informed that the remainder of the evaluation will be completed by another provider, this initial triage assessment does not replace that evaluation, and the importance of remaining in the ED until their evaluation is complete.    Azucena Cecil, PA-C 02/23/23 2142

## 2023-02-23 NOTE — ED Provider Notes (Signed)
[facility] Provider Note   CSN: [id number] Arrival date & time: 02/23/23  2120     History {Add pertinent medical, surgical, social history, OB history to HPI:1} Chief Complaint  Patient presents with   Hyperglycemia    Amanda Green is a 88 y.o. female.  The history is provided by the patient and a relative.  Hyperglycemia She has history of hypertension, diabetes, hyperlipidemia, stroke, paroxysmal atrial fibrillation anticoagulated on apixaban, breast cancer with recent diagnosis of cerebral metastases was brought in because of elevated blood sugar.  She has been on dexamethasone because of her brain metastasis and is getting cranial radiation.  Daughter was concerned because his blood sugars at home are running over 300.  She has not had any vomiting or fever and status has been at baseline.  Of note, she did have Lantus 10 units daily added to her prior regimen of metformin.   Home Medications Prior to Admission medications   Medication Sig Start Date End Date Taking? Authorizing Provider  apixaban (ELIQUIS) 5 MG TABS tablet Take 5 mg by mouth 2 (two) times daily. 06/23/21   [provider]  atorvastatin (LIPITOR) 80 MG tablet Take 80 mg by mouth daily.    [provider]  brimonidine-timolol (COMBIGAN) 0.2-0.5 % ophthalmic solution Place 1 drop into the left eye every 12 (twelve) hours.    [provider]  calcium carbonate (OSCAL) 1500 (600 Ca) MG TABS tablet Take 1,500 mg by mouth daily with breakfast.    [provider]  dexamethasone (DECADRON) 4 MG tablet Take 1 tablet (4 mg total) by mouth every 12 (twelve) hours. 02/18/23 03/20/23  [place] Donnamarie Poag, DO  diltiazem (TIAZAC) 180 MG 24 hr capsule Take 180 mg by mouth daily. 02/13/23   [provider]  dorzolamide (TRUSOPT) 2 % ophthalmic solution Place 1 drop into the left eye 2 (two) times daily. 10/19/20   [provider]  Ferrous  Sulfate (IRON PO) Take 1 tablet by mouth daily.    [provider]  hydrochlorothiazide (HYDRODIURIL) 12.5 MG tablet Take 12.5 mg by mouth daily. 01/24/23   [provider]  magnesium 30 MG tablet Take 30 mg by mouth daily.    [provider]  memantine (NAMENDA) 10 MG tablet Take 1 tablet (10 mg total) by mouth 2 (two) times daily. 02/04/23   Frann Rider, NP  metFORMIN (GLUCOPHAGE) 500 MG tablet Take 500 mg by mouth 2 (two) times daily with a meal. 08/18/11   [provider]  ofloxacin (OCUFLOX) 0.3 % ophthalmic solution Place 1 drop into the right eye 4 (four) times daily. 02/06/23   [provider]  prednisoLONE acetate (PRED FORTE) 1 % ophthalmic suspension Place 1 drop into the right eye 4 (four) times daily. 07/18/22   [provider]  pregabalin (LYRICA) 75 MG capsule Take 75 mg by mouth 3 (three) times daily. 02/13/23   [provider]  ROCKLATAN 0.02-0.005 % SOLN Place 1 drop into the left eye at bedtime. 01/31/23   [provider]  Tafamidis (VYNDAMAX) 61 MG CAPS Take 61 mg by mouth daily. 04/27/22   Croitoru, Mihai, MD  vitamin B-12 (CYANOCOBALAMIN) 1000 MCG tablet Take 1,000 mcg by mouth daily.    [provider]  vitamin C (ASCORBIC ACID) 500 MG tablet Take 500 mg by mouth daily.    [provider]      Allergies    Gabapentin    Review  of Systems   Review of Systems  All other systems reviewed and are negative.   Physical Exam Updated Vital Signs BP 104/71   Pulse (!) 52   Temp 97.8 F (36.6 C) (Oral)   Resp 19   Ht '5\' 3"'$  (1.6 m)   Wt 74.8 kg   SpO2 98%   BMI 29.23 kg/m  Physical Exam Vitals and nursing note reviewed.   88 year old female, resting comfortably and in no acute distress. Vital signs are significant for slow heart rate. Oxygen saturation is 98%, which is normal. Head is normocephalic and atraumatic. PERRLA, EOMI. Oropharynx is clear. Neck is nontender and supple without  adenopathy or JVD. Back is nontender and there is no CVA tenderness. Lungs are clear without rales, wheezes, or rhonchi. Chest is nontender. Heart has regular rate and rhythm without murmur. Abdomen is soft, flat, nontender. Extremities have no cyanosis or edema, full range of motion is present. Skin is warm and dry without rash. Neurologic: Awake and alert.  Moves all extremities equally.  ED Results / Procedures / Treatments   Labs (all labs ordered are listed, but only abnormal results are displayed) Labs Reviewed  CBC - Abnormal; Notable for the following components:      Result Value   WBC 13.4 (*)    RDW 16.8 (*)    All other components within normal limits  CBG MONITORING, ED - Abnormal; Notable for the following components:   Glucose-Capillary 321 (*)    All other components within normal limits  BASIC METABOLIC PANEL  URINALYSIS, ROUTINE W REFLEX MICROSCOPIC  CBG MONITORING, ED   Procedures Procedures  Cardiac monitor shows normal sinus rhythm, per my interpretation.  Medications Ordered in ED Medications - No data to display  ED Course/ Medical Decision Making/ A&P   {   Click here for ABCD2, HEART and other calculatorsREFRESH Note before signing :1}                          Medical Decision Making Amount and/or Complexity of Data Reviewed Labs: ordered.   Mild hyperglycemia secondary to steroid use.  I have reviewed her past records, and she was admitted on 02/13/2023 following a fall and found to have vasogenic edema with MRI showing a solitary mass which was felt to be metastatic breast cancer.  She was started on dexamethasone for her vasogenic edema and was referred for radiation oncology for cranial radiation as well as palliative radiation to a chest wall lesion.  Currently, she shows no clinical evidence of complications of diabetes such as ketoacidosis.  I have reviewed and interpreted her laboratory tests, and capillary blood glucose was only mildly  elevated at 321.  CBC shows mild leukocytosis which is likely secondary to steroid use.  Basic metabolic panel is pending.  If no evidence of ketoacidosis, she can safely be discharged and she can work with her primary care provider to adjust dose of Lantus until steroids are discontinued.  {Document critical care time when appropriate:1} {Document review of labs and clinical decision tools ie heart score, Chads2Vasc2 etc:1}  {Document your independent review of radiology images, and any outside records:1} {Document your discussion with family members, caretakers, and with consultants:1} {Document social determinants of health affecting pt's care:1} {Document your decision making why or why not admission, treatments were needed:1} Final Clinical Impression(s) / ED Diagnoses Final diagnoses:  None    Rx / DC Orders ED Discharge Orders  None       

## 2023-02-24 LAB — BASIC METABOLIC PANEL
Anion gap: 10 (ref 5–15)
BUN: 49 mg/dL — ABNORMAL HIGH (ref 8–23)
CO2: 25 mmol/L (ref 22–32)
Calcium: 10.3 mg/dL (ref 8.9–10.3)
Chloride: 99 mmol/L (ref 98–111)
Creatinine, Ser: 1.2 mg/dL — ABNORMAL HIGH (ref 0.44–1.00)
GFR, Estimated: 44 mL/min — ABNORMAL LOW (ref >60)
Glucose, Bld: 282 mg/dL — ABNORMAL HIGH (ref 70–99)
Potassium: 5.1 mmol/L (ref 3.5–5.1)
Sodium: 134 mmol/L — ABNORMAL LOW (ref 135–145)

## 2023-02-24 NOTE — Discharge Instructions (Addendum)
Make sure to drink enough fluids.  Take a few sips of fluid every few minutes throughout the day.  Continue monitoring your blood sugar at home.  Work with your primary care provider to adjust your dose of insulin to keep the blood sugar at a reasonable level.

## 2023-02-25 ENCOUNTER — Other Ambulatory Visit: Payer: Self-pay

## 2023-02-25 ENCOUNTER — Ambulatory Visit
Admission: RE | Admit: 2023-02-25 | Discharge: 2023-02-25 | Disposition: A | Discharge status: Home / Self Care | Payer: Medicare PPO | Source: Ambulatory Visit | Attending: Radiation Oncology | Admitting: Radiation Oncology

## 2023-02-25 DIAGNOSIS — R739 Hyperglycemia, unspecified: Secondary | ICD-10-CM | POA: Diagnosis not present

## 2023-02-25 DIAGNOSIS — E119 Type 2 diabetes mellitus without complications: Secondary | ICD-10-CM | POA: Diagnosis not present

## 2023-02-25 DIAGNOSIS — I4891 Unspecified atrial fibrillation: Secondary | ICD-10-CM | POA: Diagnosis not present

## 2023-02-25 DIAGNOSIS — Z510 Encounter for antineoplastic radiation therapy: Secondary | ICD-10-CM | POA: Diagnosis not present

## 2023-02-25 DIAGNOSIS — C7931 Secondary malignant neoplasm of brain: Secondary | ICD-10-CM | POA: Diagnosis not present

## 2023-02-25 DIAGNOSIS — N179 Acute kidney failure, unspecified: Secondary | ICD-10-CM | POA: Diagnosis not present

## 2023-02-25 DIAGNOSIS — C50911 Malignant neoplasm of unspecified site of right female breast: Secondary | ICD-10-CM | POA: Diagnosis not present

## 2023-02-25 DIAGNOSIS — T380X5A Adverse effect of glucocorticoids and synthetic analogues, initial encounter: Secondary | ICD-10-CM | POA: Diagnosis not present

## 2023-02-25 DIAGNOSIS — E8582 Wild-type transthyretin-related (ATTR) amyloidosis: Secondary | ICD-10-CM | POA: Diagnosis not present

## 2023-02-25 DIAGNOSIS — C50912 Malignant neoplasm of unspecified site of left female breast: Secondary | ICD-10-CM | POA: Diagnosis not present

## 2023-02-25 DIAGNOSIS — C50919 Malignant neoplasm of unspecified site of unspecified female breast: Secondary | ICD-10-CM | POA: Diagnosis not present

## 2023-02-25 DIAGNOSIS — D72829 Elevated white blood cell count, unspecified: Secondary | ICD-10-CM | POA: Diagnosis not present

## 2023-02-25 DIAGNOSIS — C773 Secondary and unspecified malignant neoplasm of axilla and upper limb lymph nodes: Secondary | ICD-10-CM | POA: Diagnosis not present

## 2023-02-25 LAB — RAD ONC ARIA SESSION SUMMARY
Course Elapsed Days: 4
Plan Fractions Treated to Date: 3
Plan Prescribed Dose Per Fraction: 3 Gy
Plan Total Fractions Prescribed: 10
Plan Total Prescribed Dose: 30 Gy
Reference Point Dosage Given to Date: 9 Gy
Reference Point Session Dosage Given: 3 Gy
Session Number: 3

## 2023-02-26 ENCOUNTER — Ambulatory Visit
Admission: RE | Admit: 2023-02-26 | Discharge: 2023-02-26 | Disposition: A | Discharge status: Home / Self Care | Payer: Medicare PPO | Source: Ambulatory Visit | Attending: Radiation Oncology | Admitting: Radiation Oncology

## 2023-02-26 ENCOUNTER — Other Ambulatory Visit: Payer: Self-pay

## 2023-02-26 DIAGNOSIS — C50912 Malignant neoplasm of unspecified site of left female breast: Secondary | ICD-10-CM | POA: Diagnosis not present

## 2023-02-26 DIAGNOSIS — C7931 Secondary malignant neoplasm of brain: Secondary | ICD-10-CM | POA: Diagnosis not present

## 2023-02-26 DIAGNOSIS — Z510 Encounter for antineoplastic radiation therapy: Secondary | ICD-10-CM | POA: Diagnosis not present

## 2023-02-26 DIAGNOSIS — C50911 Malignant neoplasm of unspecified site of right female breast: Secondary | ICD-10-CM | POA: Diagnosis not present

## 2023-02-26 DIAGNOSIS — C773 Secondary and unspecified malignant neoplasm of axilla and upper limb lymph nodes: Secondary | ICD-10-CM | POA: Diagnosis not present

## 2023-02-26 LAB — RAD ONC ARIA SESSION SUMMARY
Course Elapsed Days: 5
Plan Fractions Treated to Date: 1
Plan Prescribed Dose Per Fraction: 9 Gy
Plan Total Fractions Prescribed: 3
Plan Total Prescribed Dose: 27 Gy
Reference Point Dosage Given to Date: 9 Gy
Reference Point Session Dosage Given: 9 Gy
Session Number: 4

## 2023-02-26 NOTE — Progress Notes (Signed)
Patient ID: Amanda Green, female   DOB: December 19, 1934, 88 y.o.   MRN: [id number]  Name: Jayann Vaquero  MRN: [id number]  Date: 02/26/2023   DOB: 1935/01/27  Stereotactic Radiosurgery Operative Note  PRE-OPERATIVE DIAGNOSIS:  Solitary Brain Metastasis  POST-OPERATIVE DIAGNOSIS:  Solitary Brain Metastasis  PROCEDURE:  Stereotactic Radiosurgery  SURGEON:  Eustace Moore, MD  NARRATIVE: The patient underwent a radiation treatment planning session in the radiation oncology simulation suite under the care of the radiation oncology physician and physicist.  I participated closely in the radiation treatment planning afterwards. The patient underwent planning CT which was fused to 3T high resolution MRI with 1 mm axial slices.  These images were fused on the planning system.  We contoured the gross target volumes and subsequently expanded this to yield the Planning Target Volume. I actively participated in the planning process.  I helped to define and review the target contours and also the contours of the optic pathway, eyes, brainstem and selected nearby organs at risk.  All the dose constraints for critical structures were reviewed and compared to AAPM Task Group 101.  The prescription dose conformity was reviewed.  I approved the plan electronically.    Accordingly, Amanda Green was brought to the TrueBeam stereotactic radiation treatment linac and placed in the custom immobilization mask.  The patient was aligned according to the IR fiducial markers with [facility] Exactrac, then orthogonal x-rays were used in ExacTrac with the 6DOF robotic table and the shifts were made to align the patient  Amanda Green received stereotactic radiosurgery uneventfully.    The detailed description of the procedure is recorded in the radiation oncology procedure note.  I was present for the duration of the procedure.  DISPOSITION:  Following delivery, the patient was transported to nursing in stable  condition and monitored for possible acute effects to be discharged to home in stable condition with follow-up in one month.  Eustace Moore, MD 02/26/2023 1:16 PM

## 2023-02-26 NOTE — Progress Notes (Signed)
Amanda Green rested with [place] for 15 minutes following her [place] treatment.  Patient denies headache, dizziness, nausea, diplopia or ringing in the ears. Denies fatigue. Patient without complaints. Understands to avoid strenuous activity for the next 24 hours and call [phone] with needs.   BP (!) 143/67 (BP Location: Right Arm, Patient Position: Sitting, Cuff Size: Normal)   Pulse 73   Temp (!) 96.8 F (36 C)   Resp 20   Ht '5\' 3"'$  (1.6 m)   SpO2 98%   BMI 29.23 kg/m    Amanda Green M. [name], BSN

## 2023-02-27 ENCOUNTER — Ambulatory Visit
Admission: RE | Admit: 2023-02-27 | Discharge: 2023-02-27 | Disposition: A | Discharge status: Home / Self Care | Payer: Medicare PPO | Source: Ambulatory Visit | Attending: Radiation Oncology | Admitting: Radiation Oncology

## 2023-02-27 ENCOUNTER — Other Ambulatory Visit: Payer: Self-pay

## 2023-02-27 DIAGNOSIS — C50912 Malignant neoplasm of unspecified site of left female breast: Secondary | ICD-10-CM | POA: Diagnosis not present

## 2023-02-27 DIAGNOSIS — Z510 Encounter for antineoplastic radiation therapy: Secondary | ICD-10-CM | POA: Diagnosis not present

## 2023-02-27 DIAGNOSIS — C7931 Secondary malignant neoplasm of brain: Secondary | ICD-10-CM | POA: Diagnosis not present

## 2023-02-27 DIAGNOSIS — C50911 Malignant neoplasm of unspecified site of right female breast: Secondary | ICD-10-CM | POA: Diagnosis not present

## 2023-02-27 DIAGNOSIS — C773 Secondary and unspecified malignant neoplasm of axilla and upper limb lymph nodes: Secondary | ICD-10-CM | POA: Diagnosis not present

## 2023-02-27 LAB — RAD ONC ARIA SESSION SUMMARY
Course Elapsed Days: 6
Plan Fractions Treated to Date: 5
Plan Prescribed Dose Per Fraction: 3 Gy
Plan Total Fractions Prescribed: 10
Plan Total Prescribed Dose: 30 Gy
Reference Point Dosage Given to Date: 15 Gy
Reference Point Session Dosage Given: 3 Gy
Session Number: 5

## 2023-02-28 ENCOUNTER — Other Ambulatory Visit: Payer: Self-pay

## 2023-02-28 ENCOUNTER — Ambulatory Visit
Admission: RE | Admit: 2023-02-28 | Discharge: 2023-02-28 | Disposition: A | Discharge status: Home / Self Care | Payer: Medicare PPO | Source: Ambulatory Visit | Attending: Radiation Oncology | Admitting: Radiation Oncology

## 2023-02-28 DIAGNOSIS — C50912 Malignant neoplasm of unspecified site of left female breast: Secondary | ICD-10-CM | POA: Diagnosis not present

## 2023-02-28 DIAGNOSIS — C773 Secondary and unspecified malignant neoplasm of axilla and upper limb lymph nodes: Secondary | ICD-10-CM | POA: Diagnosis not present

## 2023-02-28 DIAGNOSIS — Z510 Encounter for antineoplastic radiation therapy: Secondary | ICD-10-CM | POA: Diagnosis not present

## 2023-02-28 DIAGNOSIS — C50911 Malignant neoplasm of unspecified site of right female breast: Secondary | ICD-10-CM | POA: Diagnosis not present

## 2023-02-28 DIAGNOSIS — C7931 Secondary malignant neoplasm of brain: Secondary | ICD-10-CM | POA: Diagnosis not present

## 2023-02-28 LAB — RAD ONC ARIA SESSION SUMMARY
Course Elapsed Days: 7
Plan Fractions Treated to Date: 6
Plan Prescribed Dose Per Fraction: 3 Gy
Plan Total Fractions Prescribed: 10
Plan Total Prescribed Dose: 30 Gy
Reference Point Dosage Given to Date: 18 Gy
Reference Point Session Dosage Given: 3 Gy
Session Number: 6

## 2023-03-01 ENCOUNTER — Encounter: Payer: Self-pay | Admitting: Hematology and Oncology

## 2023-03-01 ENCOUNTER — Ambulatory Visit
Admission: RE | Admit: 2023-03-01 | Discharge: 2023-03-01 | Disposition: A | Discharge status: Home / Self Care | Payer: Medicare PPO | Source: Ambulatory Visit | Attending: Radiation Oncology | Admitting: Radiation Oncology

## 2023-03-01 ENCOUNTER — Telehealth: Payer: Self-pay | Admitting: *Deleted

## 2023-03-01 ENCOUNTER — Inpatient Hospital Stay: Payer: Medicare PPO

## 2023-03-01 ENCOUNTER — Other Ambulatory Visit: Payer: Self-pay | Admitting: *Deleted

## 2023-03-01 ENCOUNTER — Other Ambulatory Visit: Payer: BC Managed Care – PPO

## 2023-03-01 ENCOUNTER — Other Ambulatory Visit: Payer: Self-pay

## 2023-03-01 ENCOUNTER — Inpatient Hospital Stay (HOSPITAL_BASED_OUTPATIENT_CLINIC_OR_DEPARTMENT_OTHER): Payer: Medicare PPO | Admitting: Hematology and Oncology

## 2023-03-01 VITALS — BP 124/84 | HR 92 | Temp 97.2°F | Resp 18 | Ht 63.0 in | Wt 153.8 lb

## 2023-03-01 DIAGNOSIS — C7931 Secondary malignant neoplasm of brain: Secondary | ICD-10-CM | POA: Diagnosis not present

## 2023-03-01 DIAGNOSIS — C773 Secondary and unspecified malignant neoplasm of axilla and upper limb lymph nodes: Secondary | ICD-10-CM | POA: Diagnosis not present

## 2023-03-01 DIAGNOSIS — C50911 Malignant neoplasm of unspecified site of right female breast: Secondary | ICD-10-CM | POA: Diagnosis not present

## 2023-03-01 DIAGNOSIS — Z809 Family history of malignant neoplasm, unspecified: Secondary | ICD-10-CM | POA: Diagnosis not present

## 2023-03-01 DIAGNOSIS — D496 Neoplasm of unspecified behavior of brain: Secondary | ICD-10-CM

## 2023-03-01 DIAGNOSIS — C50912 Malignant neoplasm of unspecified site of left female breast: Secondary | ICD-10-CM | POA: Diagnosis not present

## 2023-03-01 DIAGNOSIS — Z800 Family history of malignant neoplasm of digestive organs: Secondary | ICD-10-CM | POA: Diagnosis not present

## 2023-03-01 DIAGNOSIS — Z171 Estrogen receptor negative status [ER-]: Secondary | ICD-10-CM | POA: Diagnosis not present

## 2023-03-01 DIAGNOSIS — G939 Disorder of brain, unspecified: Secondary | ICD-10-CM | POA: Diagnosis not present

## 2023-03-01 DIAGNOSIS — Z510 Encounter for antineoplastic radiation therapy: Secondary | ICD-10-CM | POA: Diagnosis not present

## 2023-03-01 DIAGNOSIS — Z9011 Acquired absence of right breast and nipple: Secondary | ICD-10-CM | POA: Diagnosis not present

## 2023-03-01 LAB — CMP (CANCER CENTER ONLY)
ALT: 29 U/L (ref 0–44)
AST: 17 U/L (ref 15–41)
Albumin: 3.1 g/dL — ABNORMAL LOW (ref 3.5–5.0)
Alkaline Phosphatase: 56 U/L (ref 38–126)
Anion gap: 6 (ref 5–15)
BUN: 31 mg/dL — ABNORMAL HIGH (ref 8–23)
CO2: 28 mmol/L (ref 22–32)
Calcium: 9.8 mg/dL (ref 8.9–10.3)
Chloride: 104 mmol/L (ref 98–111)
Creatinine: 0.83 mg/dL (ref 0.44–1.00)
GFR, Estimated: >60 mL/min (ref >60)
Glucose, Bld: 134 mg/dL — ABNORMAL HIGH (ref 70–99)
Potassium: 4.1 mmol/L (ref 3.5–5.1)
Sodium: 138 mmol/L (ref 135–145)
Total Bilirubin: 0.6 mg/dL (ref 0.3–1.2)
Total Protein: 5.5 g/dL — ABNORMAL LOW (ref 6.5–8.1)

## 2023-03-01 LAB — CBC WITH DIFFERENTIAL (CANCER CENTER ONLY)
Abs Immature Granulocytes: 0.22 10*3/uL — ABNORMAL HIGH (ref 0.00–0.07)
Basophils Absolute: 0 10*3/uL (ref 0.0–0.1)
Basophils Relative: 0 %
Eosinophils Absolute: 0.1 10*3/uL (ref 0.0–0.5)
Eosinophils Relative: 0 %
HCT: 41.2 % (ref 36.0–46.0)
Hemoglobin: 14.3 g/dL (ref 12.0–15.0)
Immature Granulocytes: 2 %
Lymphocytes Relative: 9 %
Lymphs Abs: 1.3 10*3/uL (ref 0.7–4.0)
MCH: 29.4 pg (ref 26.0–34.0)
MCHC: 34.7 g/dL (ref 30.0–36.0)
MCV: 84.6 fL (ref 80.0–100.0)
Monocytes Absolute: 0.9 10*3/uL (ref 0.1–1.0)
Monocytes Relative: 6 %
Neutro Abs: 11.2 10*3/uL — ABNORMAL HIGH (ref 1.7–7.7)
Neutrophils Relative %: 83 %
Platelet Count: 192 10*3/uL (ref 150–400)
RBC: 4.87 MIL/uL (ref 3.87–5.11)
RDW: 16.8 % — ABNORMAL HIGH (ref 11.5–15.5)
WBC Count: 13.7 10*3/uL — ABNORMAL HIGH (ref 4.0–10.5)
nRBC: 0 % (ref 0.0–0.2)

## 2023-03-01 LAB — RAD ONC ARIA SESSION SUMMARY
Course Elapsed Days: 8
Plan Fractions Treated to Date: 7
Plan Prescribed Dose Per Fraction: 3 Gy
Plan Total Fractions Prescribed: 10
Plan Total Prescribed Dose: 30 Gy
Reference Point Dosage Given to Date: 21 Gy
Reference Point Session Dosage Given: 3 Gy
Session Number: 7

## 2023-03-01 NOTE — Progress Notes (Signed)
[place]  [phone] [phone]     ID: Amanda Green DOB: 01/09/1935  MR#: [id number]  [id number]  Patient Care Team: Benay Pike, MD as PCP - General (Hematology and Oncology) Sanda Klein, MD as PCP - Cardiology (Cardiology) Johnathan Hausen, MD as Consulting Physician (General Surgery) Marcial Pacas, MD as Consulting Physician (Neurology) Benay Pike, MD OTHER MD:  CHIEF COMPLAINT:   CURRENT TREATMENT:  Radiation.  INTERVAL HISTORY:  [name] arrived today to the appointment with her daughter.  Since her last visit here with Dr. Jana Hakim she was recently admitted to the hospital with a fall.  She had brain imaging which showed left occipital lobe enhancing mass measuring 3.2 x 3.2 x 2.8 cm with 5 mm left-to-right shift.  She also had systemic imaging which showed a left enhancing breast mass measuring 3.8 x 2.7 cm, left axillary lymphadenopathy.  Prior to this fall, she has been noticing some headaches and dizziness but she still was independent living by herself, doing most of her activities of daily living by herself.  She however has been very sleepy, not eating well for the past several weeks since this hospitalization.  She is now undergoing stereotactic radiation by Dr. Lisbeth Renshaw, has about 1 more week left.  She denies any other pain otherwise, change in breathing.  She has not felt this left axillary lymph nodes prior to the hospitalization.  Rest of the pertinent 10 point ROS reviewed and negative  COVID 19 VACCINATION STATUS: Status post injection times 2+ booster   HISTORY OF CURRENT ILLNESS: From the original intake note:  Amanda Green had routine left screening mammography on 02/24/2019 showing a possible abnormality. Of note, she has a history of right mastectomy in 1999 under Dr. Hassell Done.   She underwent left diagnostic mammography at [facility] on 03/04/2019 showing: breast density category B;  indeterminate calcifications in the upper-outer left breast measuring 2.9 cm.  Accordingly on 03/04/2019 she proceeded to biopsy of the left breast area in question. The pathology from this procedure [id number]) showed: ductal carcinoma in situ with calcifications, grade 3. Prognostic indicators significant for: estrogen receptor, 90% positive with strong staining intensity and progesterone receptor, 0% negative.  She was started on tamoxifen shortly after by Dr. Hassell Done due to surgery delay for pandemic concerns.  She is scheduled for definitive left breast surgery 07/17/2019  The patient's subsequent history is as detailed below.   PAST MEDICAL HISTORY: Past Medical History:  Diagnosis Date   Arthritis    knees,   Breast cancer ([place]) 1999   right mastectomy   Breast cancer, left ([place]) 05/2019   left lumpectomy   Diabetes mellitus    Dislocation of left shoulder joint    Family history of prostate cancer    Hyperlipidemia    Hypertension    Memory loss    Numbness and tingling of right lower extremity    Paresthesia    Stroke ([place])    light stroke - 2012 ,right leg nerve damage     PAST SURGICAL HISTORY: Past Surgical History:  Procedure Laterality Date   ABDOMINAL HYSTERECTOMY     APPENDECTOMY     BREAST BIOPSY Left 01/19/2010   BREAST LUMPECTOMY WITH RADIOACTIVE SEED LOCALIZATION Left 07/17/2019   Procedure: LEFT BREAST LUMPECTOMY WITH RADIOACTIVE SEED LOCALIZATION;  Surgeon: Johnathan Hausen, MD;  Location: [facility];  Service: General;  Laterality: Left;   BREAST SURGERY     Right mastectomy 1999  CARPAL TUNNEL RELEASE Right 09/30/2014   Procedure: RIGHT CARPAL TUNNEL RELEASE;  Surgeon: Daryll Brod, MD;  Location: [facility];  Service: Orthopedics;  Laterality: Right;   COLONOSCOPY     EYE SURGERY     cataracts   JOINT REPLACEMENT     lt total knee 2011   LIPOMA EXCISION  11/28/2011   Procedure: EXCISION LIPOMA;  Surgeon: Pedro Earls, MD;  Location: [place];  Service: General;  Laterality: N/A;  excisino lipoma 4 cm back of neck   MASTECTOMY Right 1999   TOTAL MASTECTOMY Left 05/10/2021   Procedure: LEFT TOTAL MASTECTOMY;  Surgeon: Johnathan Hausen, MD;  Location: [facility];  Service: General;  Laterality: Left;    FAMILY HISTORY: Family History  Problem Relation Age of Onset   Esophageal cancer Mother        smoker   Other Father        Unsure of medical history.   Prostate cancer Brother        d. 63s   Cancer Son        ?? unsure, but may have had cancer   Cancer Son        ?? unsure, but may have had cancer  Patient's father died from lung cancer. Patient's mother died from esophageal cancer. The patient denies a family hx of breast or ovarian cancer. She has 1 brother. He was diagnosed with prostate cancer.   GYNECOLOGIC HISTORY:  No LMP recorded. Patient has had a hysterectomy. Menarche: 88 years old Age at first live birth: 88 years old [place] P 5 LMP age 43 Hysterectomy? Yes, age 73, uterus only BSO? No Hormone replacement?   SOCIAL HISTORY: (updated 06/10/2019)  Amanda Green is originally from [place].  She is currently retired. She is widowed. She lives at home with 1 of her granddaughteris, who is 49 and works at the [employer].  Another of her granddaughters [name] works here in our center.    ADVANCED DIRECTIVES: not in place; she plans to name her daughter [name]) as her healthcare power of attorney.  At the 09/14/2019 visit she was given the appropriate forms to complete and notarize at her discretion   HEALTH MAINTENANCE: Social History   Tobacco Use   Smoking status: Never   Smokeless tobacco: Never  Vaping Use   Vaping Use: Never used  Substance Use Topics   Alcohol use: No   Drug use: No     Colonoscopy: 03/2011, Dr. Penelope Coop, benign polyp  PAP: not on file, remote abdominal hysterectomy  Bone density: 06/2019, -1.8   Allergies  Allergen Reactions    Gabapentin     Drowsiness    Current Outpatient Medications  Medication Sig Dispense Refill   apixaban (ELIQUIS) 5 MG TABS tablet Take 5 mg by mouth 2 (two) times daily.     atorvastatin (LIPITOR) 80 MG tablet Take 80 mg by mouth daily.     brimonidine-timolol (COMBIGAN) 0.2-0.5 % ophthalmic solution Place 1 drop into the left eye every 12 (twelve) hours.     calcium carbonate (OSCAL) 1500 (600 Ca) MG TABS tablet Take 1,500 mg by mouth daily with breakfast.     dexamethasone (DECADRON) 4 MG tablet Take 1 tablet (4 mg total) by mouth every 12 (twelve) hours. 60 tablet 0   diltiazem (TIAZAC) 180 MG 24 hr capsule Take 180 mg by mouth daily.     dorzolamide (TRUSOPT) 2 % ophthalmic solution Place 1 drop into the  left eye 2 (two) times daily.     Ferrous Sulfate (IRON PO) Take 1 tablet by mouth daily.     hydrochlorothiazide (HYDRODIURIL) 12.5 MG tablet Take 12.5 mg by mouth daily.     magnesium 30 MG tablet Take 30 mg by mouth daily.     memantine (NAMENDA) 10 MG tablet Take 1 tablet (10 mg total) by mouth 2 (two) times daily. 180 tablet 3   metFORMIN (GLUCOPHAGE) 500 MG tablet Take 500 mg by mouth 2 (two) times daily with a meal.     ofloxacin (OCUFLOX) 0.3 % ophthalmic solution Place 1 drop into the right eye 4 (four) times daily.     prednisoLONE acetate (PRED FORTE) 1 % ophthalmic suspension Place 1 drop into the right eye 4 (four) times daily.     pregabalin (LYRICA) 75 MG capsule Take 75 mg by mouth 3 (three) times daily.     ROCKLATAN 0.02-0.005 % SOLN Place 1 drop into the left eye at bedtime.     Tafamidis (VYNDAMAX) 61 MG CAPS Take 61 mg by mouth daily. 30 capsule 12   vitamin B-12 (CYANOCOBALAMIN) 1000 MCG tablet Take 1,000 mcg by mouth daily.     vitamin C (ASCORBIC ACID) 500 MG tablet Take 500 mg by mouth daily.     No current facility-administered medications for this visit.    OBJECTIVE:   There were no vitals filed for this visit.  Wt Readings from Last 3 Encounters:   02/23/23 165 lb (74.8 kg)  02/14/23 165 lb 2 oz (74.9 kg)  01/15/23 157 lb 9.6 oz (71.5 kg)   There is no height or weight on file to calculate BMI.   ECOG FS: GENERAL: Patient is a well appearing female in no acute distress HEENT:  Sclerae anicteric.  Oropharynx clear and moist. No ulcerations or evidence of oropharyngeal candidiasis. Neck is supple.  NODES:  No cervical, supraclavicular, or axillary lymphadenopathy palpated.  BREAST EXAM:  s/p bilateral mastectomies, no sign of local recurrence, healing well LUNGS:  Clear to auscultation bilaterally.  No wheezes or rhonchi. HEART:  Regular rate and rhythm. No murmur appreciated. ABDOMEN:  Soft, nontender.  Positive, normoactive bowel sounds. No organomegaly palpated. MSK:  No focal spinal tenderness to palpation. Full range of motion bilaterally in the upper extremities. EXTREMITIES:  No peripheral edema.   SKIN:  Clear with no obvious rashes or skin changes. No nail dyscrasia. NEURO:  Nonfocal. Well oriented.  Appropriate affect.   LAB RESULTS:  CMP     Component Value Date/Time   NA 134 (L) 02/23/2023 2225   NA 139 02/07/2021 1050   K 5.1 02/23/2023 2225   CL 99 02/23/2023 2225   CO2 25 02/23/2023 2225   GLUCOSE 282 (H) 02/23/2023 2225   BUN 49 (H) 02/23/2023 2225   BUN 17 02/07/2021 1050   CREATININE 1.20 (H) 02/23/2023 2225   CALCIUM 10.3 02/23/2023 2225   PROT 6.7 01/03/2022 1527   ALBUMIN 2.7 (L) 04/15/2021 0346   ALBUMIN 3.9 02/07/2021 1050   AST 15 04/15/2021 0346   ALT 11 04/15/2021 0346   ALKPHOS 39 04/15/2021 0346   BILITOT 0.7 04/15/2021 0346   BILITOT 0.4 02/07/2021 1050   GFRNONAA 44 (L) 02/23/2023 2225   GFRAA 72 02/07/2021 1050    No results found for: "TOTALPROTELP", "ALBUMINELP", "A1GS", "A2GS", "BETS", "BETA2SER", "GAMS", "MSPIKE", "SPEI"  No results found for: "KPAFRELGTCHN", "LAMBDASER", "KAPLAMBRATIO"  Lab Results  Component Value Date   WBC 13.7 (H) 03/01/2023  NEUTROABS 11.2 (H)  03/01/2023   HGB 14.3 03/01/2023   HCT 41.2 03/01/2023   MCV 84.6 03/01/2023   PLT 192 03/01/2023   No results found for: "LABCA2"  No components found for: "[id number]"  No results for input(s): "INR" in the last 168 hours.  No results found for: "LABCA2"  No results found for: "[id number]"  No results found for: "CAN125"  No results found for: "CAN153"  No results found for: "CA2729"  No components found for: "HGQUANT"  No results found for: "CEA1", "CEA" / No results found for: "CEA1", "CEA"   No results found for: "AFPTUMOR"  No results found for: "CHROMOGRNA"  No results found for: "HGBA", "HGBA2QUANT", "HGBFQUANT", "HGBSQUAN" (Hemoglobinopathy evaluation)   No results found for: "LDH"  No results found for: "IRON", "TIBC", "IRONPCTSAT" (Iron and TIBC)  No results found for: "FERRITIN"  Urinalysis    Component Value Date/Time   COLORURINE YELLOW 04/29/2021 1350   APPEARANCEUR HAZY (A) 04/29/2021 1350   LABSPEC 1.006 04/29/2021 1350   PHURINE 5.0 04/29/2021 1350   GLUCOSEU NEGATIVE 04/29/2021 1350   HGBUR NEGATIVE 04/29/2021 1350   BILIRUBINUR NEGATIVE 04/29/2021 1350   KETONESUR NEGATIVE 04/29/2021 1350   PROTEINUR NEGATIVE 04/29/2021 1350   UROBILINOGEN 0.2 03/23/2010 1156   NITRITE NEGATIVE 04/29/2021 1350   LEUKOCYTESUR MODERATE (A) 04/29/2021 1350    STUDIES: [place] CORE BIOPSY (LYMPH NODES)  Result Date: 02/15/2023 INDICATION: History of breast cancer with LEFT axillary mass. EXAM: ULTRASOUND-GUIDED LEFT AXILLARY MASS BIOPSY COMPARISON:  CT CAP, 02/14/2023. MEDICATIONS: 10 mL lidocaine 1% with epinephrine ANESTHESIA/SEDATION: Local anesthetic was administered. COMPLICATIONS: None immediate. TECHNIQUE: Informed written consent was obtained from the patient and/or patient's representative after a discussion of the risks, benefits and alternatives to treatment. Questions regarding the procedure were encouraged and answered. Initial ultrasound scanning  demonstrated LEFT axillary mass. An ultrasound image was saved for documentation purposes. The procedure was planned. A timeout was performed prior to the initiation of the procedure. The operative was prepped and draped in the usual sterile fashion, and a sterile drape was applied covering the operative field. A timeout was performed prior to the initiation of the procedure. Local anesthesia was provided with 1% lidocaine with epinephrine. Under direct ultrasound guidance, an 18 gauge core needle device was utilized to obtain to obtain 4 core needle biopsies of the LEFT axillary mass. The samples were placed in saline and submitted to pathology. The needle was removed and superficial hemostasis was achieved with manual compression. Post procedure scan was negative for significant hematoma. A dressing was applied. The patient tolerated the procedure well without immediate postprocedural complication. IMPRESSION: Successful ultrasound guided biopsy of LEFT axillary mass. Michaelle Birks, MD [facility] Radiology Electronically Signed   By: Michaelle Birks M.D.   On: 02/15/2023 14:47   MR BRAIN W WO CONTRAST  Result Date: 02/14/2023 CLINICAL DATA:  Follow-up examination for CNS neoplasm, SRS protocol for treatment planning. EXAM: MRI HEAD WITHOUT AND WITH CONTRAST TECHNIQUE: Multiplanar, multiecho pulse sequences of the brain and surrounding structures were obtained without and with intravenous contrast. CONTRAST:  7.52mL GADAVIST GADOBUTROL 1 MMOL/ML IV SOLN COMPARISON:  MRI from 02/13/2023. FINDINGS: Brain: Previously identified enhancing mass centered at the parasagittal left occipital lobe again seen. Lesion measures 2.9 x 2.9 x 3.1 cm on this exam (AP by transverse by craniocaudad). Surrounding T2/FLAIR signal abnormality, consistent with vasogenic edema and/or possibly infiltrating nonenhancing tumor. Regional mass effect with partial effacement of the posterior left  lateral ventricle. Up to 5 mm of left-to-right shift. Appearance is stable. No other mass lesion or abnormal enhancement. 9 mm nonenhancing nodular focus along the posterior right lateral ventricle again noted, likely a small focus of gray matter heterotopia. No acute or subacute infarct. Underlying atrophy with chronic microvascular ischemic disease again noted. Small remote right cerebellar infarcts. No hydrocephalus or trapping. Basilar cisterns remain patent. No extra-axial fluid collection. Pituitary gland and suprasellar region within normal limits. Vascular: Major intracranial vascular flow voids are maintained. Skull and upper cervical spine: Craniocervical junction within normal limits. Bone marrow signal intensity normal. No focal marrow replacing lesion. No scalp soft tissue abnormality. Sinuses/Orbits: Prior bilateral ocular lens replacement. Paranasal sinuses are clear. Trace left mastoid effusion, the significance. Other: None. IMPRESSION: 1. 2.9 x 2.9 x 3.1 cm enhancing mass centered at the parasagittal left occipital lobe, likely a solitary intracranial metastasis given the history of breast cancer, although a primary CNS neoplasm remains a consideration. Associated regional mass effect with up to 5 mm of left-to-right shift, stable. Examination to be utilized for treatment planning purposes. 2. 9 mm nonenhancing nodular focus along the posterior right lateral ventricle, likely a small focus of gray matter heterotopia. 3. Underlying atrophy with chronic microvascular ischemic disease, with small remote right cerebellar infarcts. Electronically Signed   By: Jeannine Boga M.D.   On: 02/14/2023 20:31   CT CHEST ABDOMEN PELVIS W CONTRAST  Addendum Date: 02/14/2023   ADDENDUM REPORT: 02/14/2023 03:49 ADDENDUM: Left breast surgical changes with enhancing mass and multiple large lymph nodes in the left axilla, concerning for malignancy. Electronically Signed   By: Brett Fairy M.D.   On:  02/14/2023 03:49   Result Date: 02/14/2023 CLINICAL DATA:  Brain, CNS neoplasm. Staging and brain metastasis. Recent falls. EXAM: CT CHEST, ABDOMEN, AND PELVIS WITH CONTRAST TECHNIQUE: Multidetector CT imaging of the chest, abdomen and pelvis was performed following the standard protocol during bolus administration of intravenous contrast. RADIATION DOSE REDUCTION: This exam was performed according to the departmental dose-optimization program which includes automated exposure control, adjustment of the mA and/or kV according to patient size and/or use of iterative reconstruction technique. CONTRAST:  170mL OMNIPAQUE IOHEXOL 300 MG/ML  SOLN COMPARISON:  02/26/2022, 05/11/2006. FINDINGS: CT CHEST FINDINGS Cardiovascular: The heart is enlarged and there is no pericardial effusion. Multi-vessel coronary artery calcifications are noted. There is atherosclerotic calcification of the aorta without evidence of aneurysm. The pulmonary trunk is normal in caliber. Mediastinum/Nodes: No mediastinal or hilar lymphadenopathy. Enlarged lymph nodes are present in the left axilla. Surgical clips are noted in the right axilla. The thyroid gland, trachea, and esophagus are within normal limits. There is a small hiatal hernia. Lungs/Pleura: Atelectasis is present bilaterally. No effusion or pneumothorax. Musculoskeletal: Mastectomy changes are noted on the right. There is skin thickening over the left breast. An enhancing mass is noted in the upper-outer quadrant of the left breast/axilla measuring 3.8 x 2.7 cm. Degenerative changes in the thoracic spine. No acute or suspicious osseous abnormality. CT ABDOMEN PELVIS FINDINGS Hepatobiliary: No focal liver abnormality is seen. No gallstones, gallbladder wall thickening, or biliary dilatation. Pancreas: Unremarkable. No pancreatic ductal dilatation or surrounding inflammatory changes. Spleen: Normal in size. There is a stable hypodensity in the spleen, likely representing cysts or  hemangioma. Adrenals/Urinary Tract: There is a stable left adrenal nodule measuring 1.6 cm, previously described as adenoma by MRI. The right adrenal gland is within normal limits. The kidneys enhance symmetrically. No renal calculus or hydronephrosis. The bladder is  markedly distended. No bladder wall thickening. Stomach/Bowel: Small hiatal hernia. Stomach is otherwise within normal limits. Right hemicolectomy changes are noted. No evidence of bowel wall thickening, distention, or inflammatory changes. No free air or pneumatosis. Scattered diverticula are present along the colon without evidence of diverticulitis. Vascular/Lymphatic: Aortic atherosclerosis. No enlarged abdominal or pelvic lymph nodes. Reproductive: Status post hysterectomy. No adnexal masses. Other: No abdominopelvic ascites. Fat containing umbilical hernia is noted. Musculoskeletal: Degenerative changes in the lumbar spine. No acute osseous abnormality. IMPRESSION: 1. Enhancing left breast mass and measuring 3.8 x 2.7 cm and left axillary lymphadenopathy, concerning for malignancy. Further evaluation with diagnostic mammography and ultrasound are recommended. 2. No evidence of distant metastasis. 3. Markedly distended urinary bladder with no bladder wall thickening. 4. Left adrenal adenoma, unchanged from 2007. 5. Diverticulosis without diverticulitis. 6. Aortic atherosclerosis and coronary artery calcifications. 7. Remaining incidental findings as described above. Electronically Signed: By: Brett Fairy M.D. On: 02/14/2023 02:41   MR Brain W and Wo Contrast  Result Date: 02/14/2023 CLINICAL DATA:  Initial evaluation for intracranial mass. EXAM: MRI HEAD WITHOUT AND WITH CONTRAST TECHNIQUE: Multiplanar, multiecho pulse sequences of the brain and surrounding structures were obtained without and with intravenous contrast. CONTRAST:  45mL GADAVIST GADOBUTROL 1 MMOL/ML IV SOLN COMPARISON:  Prior CT from earlier the same day. FINDINGS: Brain: Mild  age-related cerebral volume loss. Scattered patchy T2/FLAIR hyperintensity involving the periventricular deep white matter both cerebral hemispheres as well as the pons, most consistent with chronic small vessel ischemic disease. Few small remote lacunar infarcts present about the basal ganglia and left thalamus. Small remote right cerebellar infarct noted as well. No evidence for acute or subacute infarct. No areas of chronic cortical infarction. No acute intracranial hemorrhage. Single chronic microhemorrhage noted at the right occipital lobe. Intra-axial avidly enhancing mass centered at the left occipital lobe measures 3.2 x 3.2 x 2.8 cm (AP by craniocaudad by transverse). Associated mild susceptibility artifact noted about the periphery of this lesion. Extensive surrounding T2/FLAIR signal abnormality, which could reflect vasogenic edema and/or infiltrating nonenhancing tumor. Associated mild 5 mm left-to-right shift. No hydrocephalus or trapping. Basilar cisterns remain patent. No other mass or abnormal enhancement. Small nodular focus along the posterior margin of the right lateral ventricle noted, likely a focus of gray matter heterotopia. No extra-axial fluid collection. Pituitary gland and suprasellar region within normal limits. Vascular: Major intracranial vascular flow voids are maintained. Hypoplastic left vertebral artery noted. Skull and upper cervical spine: Craniocervical junction within normal limits. Bone marrow signal intensity normal. No scalp soft tissue abnormality. Sinuses/Orbits: Prior bilateral ocular lens replacement. Paranasal sinuses are largely clear. No mastoid effusion. Other: None. IMPRESSION: 1. 3.2 x 3.2 x 2.8 cm intra-axial enhancing mass centered at the left occipital lobe, corresponding with abnormality on prior CT. Primary differential considerations include a primary CNS neoplasm versus solitary intracranial metastasis. Surrounding T2/FLAIR signal abnormality could reflect  vasogenic edema and/or infiltrating nonenhancing tumor. Associated mild 5 mm left-to-right shift. 2. No other acute intracranial abnormality. 3. Underlying mild age-related cerebral volume loss with chronic small vessel ischemic disease. Small remote right cerebellar infarct. Electronically Signed   By: Jeannine Boga M.D.   On: 02/14/2023 00:58   CT HEAD WO CONTRAST (5MM)  Result Date: 02/13/2023 CLINICAL DATA:  Trauma. EXAM: CT HEAD WITHOUT CONTRAST TECHNIQUE: Contiguous axial images were obtained from the base of the skull through the vertex without intravenous contrast. RADIATION DOSE REDUCTION: This exam was performed according to the departmental dose-optimization program which  includes automated exposure control, adjustment of the mA and/or kV according to patient size and/or use of iterative reconstruction technique. COMPARISON:  Head CT 04/29/2021.  MRI brain 02/20/2021. FINDINGS: Brain: There is new vasogenic edema in the left frontoparietal and temporal region with associated sulcal effacement. There is 4 mm of midline shift to the right. There is no acute intracranial hemorrhage or extra-axial fluid collection. There is some mass effect on the left lateral ventricle. There is no hydrocephalus. Vascular: Atherosclerotic calcifications are present within the cavernous internal carotid arteries. Skull: Normal. Negative for fracture or focal lesion. Sinuses/Orbits: No acute finding. Other: None. IMPRESSION: New vasogenic edema in the left frontoparietal and temporal lobes with associated sulcal effacement and 4 mm of midline shift to the right. Findings are concerning for underlying mass. Contrast-enhanced MRI is recommended for further evaluation. Electronically Signed   By: Ronney Asters M.D.   On: 02/13/2023 22:06    ELIGIBLE FOR AVAILABLE RESEARCH PROTOCOL: no  ASSESSMENT: 88 y.o. [place] woman  (1) status post right mastectomy 1999 -- no further details available  (2) status post  left breast biopsy 03/04/2019 for a clinical 2.9 cm noninvasive breast cancer (ductal carcinoma in situ), grade 3, estrogen receptor positive, progesterone receptor negative  (3) tamoxifen started March 2020, discontinued April 2022 with disease progression  (a) bone density 06/23/2019 shows a T score of -1.8   (4) left lumpectomy 07/17/2019 showed ductal carcinoma in situ measuring 2.7 cm, with close but negative margins  (5) genetics testing 09/25/2019 through the Common Hereditary Gene Panel offered by [facility] found no deleterious mutations in APC, ATM, AXIN2, BARD1, BMPR1A, BRCA1, BRCA2, BRIP1, CDH1, CDK4, CDKN2A (p14ARF), CDKN2A (p16INK4a), CHEK2, CTNNA1, DICER1, EPCAM (Deletion/duplication testing only), GREM1 (promoter region deletion/duplication testing only), KIT, MEN1, MLH1, MSH2, MSH3, MSH6, MUTYH, NBN, NF1, NHTL1, PALB2, PDGFRA, PMS2, POLD1, POLE, PTEN, RAD50, RAD51C, RAD51D, RNF43, SDHB, SDHC, SDHD, SMAD4, SMARCA4. STK11, TP53, TSC1, TSC2, and VHL.  The following genes were evaluated for sequence changes only: SDHA and HOXB13 c.251G>A variant only. The report date is 09/25/2019.  (a)  EPCAM VUS noted  (6) left breast biopsy x2 on 03/30/2021 both showed ductal carcinoma in situ, high-grade, estrogen and progesterone receptor positive  (7) left mastectomy 05/10/2021 showed a pT1 (mic) focus of invasive ductal carcinoma which was HER2 amplified, but estrogen and progesterone receptor negative, with an MIB-1 of 15%  (a) extensive DCIS, grade 3 was noted in the sample, with ample margins   PLAN:  She is now diagnosed with metastatic breast cancer.  She had left axillary lymph node biopsy which showed poorly differentiated carcinoma consistent with breast primary.  Prognostics were not performed.  Her past microinvasive breast cancer was HER2 amplified. I have recommended that she come back to see me in about 2 weeks.  We have discussed that if she continues to grow stronger and her  performance status improves, we can consider systemic treatment.  If she is indeed still HER2 amplified, we can at least consider single agent Herceptin given her age and borderline PS.  If she however continues to deteriorate, performance status worsens, I have recommended that the consider palliative care and comfort measures.  Her daughter Jackelyn Poling was also on the phone today.  They all expressed understanding of the recommendations and they are all on the same page.  All her questions were answered to the best of my knowledge.  Benay Pike, MD Medical Oncology and Hematology [facility] [place], [place]  [place] Tel. [phone]    Fax. [phone]   Total time spent: 45 minutes.  *Total Encounter Time as defined by the Centers for Medicare and Medicaid Services includes, in addition to the face-to-face time of a patient visit (documented in the note above) non-face-to-face time: obtaining and reviewing outside history, ordering and reviewing medications, tests or procedures, care coordination (communications with other health care professionals or caregivers) and documentation in the medical record.

## 2023-03-01 NOTE — Telephone Encounter (Signed)
No entry 

## 2023-03-02 LAB — CANCER ANTIGEN 27.29: CA 27.29: 65.4 U/mL — ABNORMAL HIGH (ref 0.0–38.6)

## 2023-03-04 ENCOUNTER — Ambulatory Visit
Admission: RE | Admit: 2023-03-04 | Discharge: 2023-03-04 | Disposition: A | Discharge status: Home / Self Care | Payer: Medicare PPO | Source: Ambulatory Visit | Attending: Radiation Oncology | Admitting: Radiation Oncology

## 2023-03-04 ENCOUNTER — Other Ambulatory Visit: Payer: Self-pay

## 2023-03-04 ENCOUNTER — Encounter: Payer: Self-pay | Admitting: Radiation Oncology

## 2023-03-04 DIAGNOSIS — C50912 Malignant neoplasm of unspecified site of left female breast: Secondary | ICD-10-CM | POA: Diagnosis not present

## 2023-03-04 DIAGNOSIS — C50911 Malignant neoplasm of unspecified site of right female breast: Secondary | ICD-10-CM | POA: Diagnosis not present

## 2023-03-04 DIAGNOSIS — C7931 Secondary malignant neoplasm of brain: Secondary | ICD-10-CM | POA: Diagnosis not present

## 2023-03-04 DIAGNOSIS — C773 Secondary and unspecified malignant neoplasm of axilla and upper limb lymph nodes: Secondary | ICD-10-CM | POA: Diagnosis not present

## 2023-03-04 DIAGNOSIS — Z510 Encounter for antineoplastic radiation therapy: Secondary | ICD-10-CM | POA: Diagnosis not present

## 2023-03-04 LAB — RAD ONC ARIA SESSION SUMMARY
Course Elapsed Days: 11
Plan Fractions Treated to Date: 8
Plan Prescribed Dose Per Fraction: 3 Gy
Plan Total Fractions Prescribed: 10
Plan Total Prescribed Dose: 30 Gy
Reference Point Dosage Given to Date: 24 Gy
Reference Point Session Dosage Given: 3 Gy
Session Number: 8

## 2023-03-04 LAB — SURGICAL PATHOLOGY

## 2023-03-04 NOTE — Progress Notes (Signed)
  Radiation Oncology         [phone]) [phone] ________________________________  Name: Amanda Green  [id number]  Date of Service: 03/04/23  DOB: 1935/09/01   Steroid Taper Instructions   You currently have a prescription for Dexamethasone 4 mg Tablets.   Beginning 03/08/23: Take 1/2 of a tablet (which is 2 mg) twice a day  Beginning 03/15/23: Take 1/2 of a tablet (which is 2 mg) once a day  Beginning 03/22/23: Take 1/2 of a tablet (which is 2 mg) every other day and stop on 03/27/23.   Please call our office if you have any headaches, visual changes, uncontrolled movements, extremity weakness, nausea or vomiting.

## 2023-03-05 ENCOUNTER — Ambulatory Visit
Admission: RE | Admit: 2023-03-05 | Discharge: 2023-03-05 | Disposition: A | Discharge status: Home / Self Care | Payer: Medicare PPO | Source: Ambulatory Visit | Attending: Radiation Oncology | Admitting: Radiation Oncology

## 2023-03-05 ENCOUNTER — Other Ambulatory Visit: Payer: Self-pay

## 2023-03-05 DIAGNOSIS — Z510 Encounter for antineoplastic radiation therapy: Secondary | ICD-10-CM | POA: Diagnosis not present

## 2023-03-05 DIAGNOSIS — C50912 Malignant neoplasm of unspecified site of left female breast: Secondary | ICD-10-CM | POA: Diagnosis not present

## 2023-03-05 DIAGNOSIS — C7931 Secondary malignant neoplasm of brain: Secondary | ICD-10-CM | POA: Diagnosis not present

## 2023-03-05 DIAGNOSIS — C773 Secondary and unspecified malignant neoplasm of axilla and upper limb lymph nodes: Secondary | ICD-10-CM | POA: Diagnosis not present

## 2023-03-05 DIAGNOSIS — C50911 Malignant neoplasm of unspecified site of right female breast: Secondary | ICD-10-CM | POA: Diagnosis not present

## 2023-03-05 LAB — RAD ONC ARIA SESSION SUMMARY
Course Elapsed Days: 12
Plan Fractions Treated to Date: 9
Plan Prescribed Dose Per Fraction: 3 Gy
Plan Total Fractions Prescribed: 10
Plan Total Prescribed Dose: 30 Gy
Reference Point Dosage Given to Date: 27 Gy
Reference Point Session Dosage Given: 3 Gy
Session Number: 9

## 2023-03-06 ENCOUNTER — Ambulatory Visit
Admission: RE | Admit: 2023-03-06 | Discharge: 2023-03-06 | Disposition: A | Discharge status: Home / Self Care | Payer: Medicare PPO | Source: Ambulatory Visit | Attending: Radiation Oncology | Admitting: Radiation Oncology

## 2023-03-06 ENCOUNTER — Other Ambulatory Visit: Payer: Self-pay

## 2023-03-06 ENCOUNTER — Encounter (HOSPITAL_COMMUNITY): Payer: Self-pay | Admitting: Emergency Medicine

## 2023-03-06 ENCOUNTER — Emergency Department (HOSPITAL_COMMUNITY): Payer: Medicare PPO

## 2023-03-06 ENCOUNTER — Emergency Department (HOSPITAL_COMMUNITY)
Admission: EM | Admit: 2023-03-06 | Discharge: 2023-03-07 | Disposition: A | Discharge status: Home / Self Care | Payer: Medicare PPO | Attending: Emergency Medicine | Admitting: Emergency Medicine

## 2023-03-06 ENCOUNTER — Encounter: Payer: Self-pay | Admitting: Radiation Oncology

## 2023-03-06 DIAGNOSIS — Z79899 Other long term (current) drug therapy: Secondary | ICD-10-CM | POA: Insufficient documentation

## 2023-03-06 DIAGNOSIS — Z853 Personal history of malignant neoplasm of breast: Secondary | ICD-10-CM | POA: Insufficient documentation

## 2023-03-06 DIAGNOSIS — I4891 Unspecified atrial fibrillation: Secondary | ICD-10-CM | POA: Diagnosis not present

## 2023-03-06 DIAGNOSIS — Z7901 Long term (current) use of anticoagulants: Secondary | ICD-10-CM | POA: Insufficient documentation

## 2023-03-06 DIAGNOSIS — E119 Type 2 diabetes mellitus without complications: Secondary | ICD-10-CM | POA: Insufficient documentation

## 2023-03-06 DIAGNOSIS — Z7984 Long term (current) use of oral hypoglycemic drugs: Secondary | ICD-10-CM | POA: Insufficient documentation

## 2023-03-06 DIAGNOSIS — C773 Secondary and unspecified malignant neoplasm of axilla and upper limb lymph nodes: Secondary | ICD-10-CM | POA: Diagnosis not present

## 2023-03-06 DIAGNOSIS — I10 Essential (primary) hypertension: Secondary | ICD-10-CM | POA: Diagnosis not present

## 2023-03-06 DIAGNOSIS — C50912 Malignant neoplasm of unspecified site of left female breast: Secondary | ICD-10-CM | POA: Diagnosis not present

## 2023-03-06 DIAGNOSIS — R0602 Shortness of breath: Secondary | ICD-10-CM | POA: Diagnosis not present

## 2023-03-06 DIAGNOSIS — Z8673 Personal history of transient ischemic attack (TIA), and cerebral infarction without residual deficits: Secondary | ICD-10-CM | POA: Insufficient documentation

## 2023-03-06 DIAGNOSIS — R188 Other ascites: Secondary | ICD-10-CM | POA: Diagnosis not present

## 2023-03-06 DIAGNOSIS — R600 Localized edema: Secondary | ICD-10-CM | POA: Diagnosis not present

## 2023-03-06 DIAGNOSIS — Z510 Encounter for antineoplastic radiation therapy: Secondary | ICD-10-CM | POA: Diagnosis not present

## 2023-03-06 LAB — COMPREHENSIVE METABOLIC PANEL
ALT: 36 U/L (ref 0–44)
AST: 23 U/L (ref 15–41)
Albumin: 3 g/dL — ABNORMAL LOW (ref 3.5–5.0)
Alkaline Phosphatase: 49 U/L (ref 38–126)
Anion gap: 10 (ref 5–15)
BUN: 30 mg/dL — ABNORMAL HIGH (ref 8–23)
CO2: 24 mmol/L (ref 22–32)
Calcium: 9.8 mg/dL (ref 8.9–10.3)
Chloride: 95 mmol/L — ABNORMAL LOW (ref 98–111)
Creatinine, Ser: 0.91 mg/dL (ref 0.44–1.00)
GFR, Estimated: >60 mL/min (ref >60)
Glucose, Bld: 224 mg/dL — ABNORMAL HIGH (ref 70–99)
Potassium: 4.4 mmol/L (ref 3.5–5.1)
Sodium: 129 mmol/L — ABNORMAL LOW (ref 135–145)
Total Bilirubin: 0.8 mg/dL (ref 0.3–1.2)
Total Protein: 5.3 g/dL — ABNORMAL LOW (ref 6.5–8.1)

## 2023-03-06 LAB — CBC WITH DIFFERENTIAL/PLATELET
Abs Immature Granulocytes: 0.16 10*3/uL — ABNORMAL HIGH (ref 0.00–0.07)
Basophils Absolute: 0 10*3/uL (ref 0.0–0.1)
Basophils Relative: 0 %
Eosinophils Absolute: 0 10*3/uL (ref 0.0–0.5)
Eosinophils Relative: 0 %
HCT: 46 % (ref 36.0–46.0)
Hemoglobin: 15 g/dL (ref 12.0–15.0)
Immature Granulocytes: 1 %
Lymphocytes Relative: 3 %
Lymphs Abs: 0.5 10*3/uL — ABNORMAL LOW (ref 0.7–4.0)
MCH: 29.6 pg (ref 26.0–34.0)
MCHC: 32.6 g/dL (ref 30.0–36.0)
MCV: 90.7 fL (ref 80.0–100.0)
Monocytes Absolute: 0.6 10*3/uL (ref 0.1–1.0)
Monocytes Relative: 4 %
Neutro Abs: 13.7 10*3/uL — ABNORMAL HIGH (ref 1.7–7.7)
Neutrophils Relative %: 92 %
Platelets: 247 10*3/uL (ref 150–400)
RBC: 5.07 MIL/uL (ref 3.87–5.11)
RDW: 17.5 % — ABNORMAL HIGH (ref 11.5–15.5)
WBC: 15 10*3/uL — ABNORMAL HIGH (ref 4.0–10.5)
nRBC: 0 % (ref 0.0–0.2)

## 2023-03-06 LAB — RAD ONC ARIA SESSION SUMMARY
Course Elapsed Days: 13
Plan Fractions Treated to Date: 10
Plan Prescribed Dose Per Fraction: 3 Gy
Plan Total Fractions Prescribed: 10
Plan Total Prescribed Dose: 30 Gy
Reference Point Dosage Given to Date: 30 Gy
Reference Point Session Dosage Given: 3 Gy
Session Number: 10

## 2023-03-06 LAB — LIPASE, BLOOD: Lipase: 47 U/L (ref 11–51)

## 2023-03-06 MED ORDER — INSULIN GLARGINE-YFGN 100 UNIT/ML ~~LOC~~ SOLN
10.0000 [IU] | Freq: Once | SUBCUTANEOUS | Status: AC
  Administered 2023-03-07: 10 [IU] via SUBCUTANEOUS
  Filled 2023-03-06: qty 0.1

## 2023-03-06 NOTE — ED Provider Notes (Signed)
[place] EMERGENCY DEPARTMENT AT [facility] Provider Note   CSN: [id number] Arrival date & time: 03/06/23  1832     History  Chief Complaint  Patient presents with   Leg Swelling    Amanda Green is a 88 y.o. female.  88 year old female with prior medical history as detailed below presents for evaluation.  Patient is accompanied by her daughter.  The daughter reports that over the last 2 to 3 days the patient has had increased lower extremity edema.  Patient went to [facility] primary care clinic this evening and was referred directly to the ED.  Patient denies any specific complaint.  She specifically denies chest pain, shortness of breath, pain, nausea, vomiting, other complaint.  No recent fever reported per patient or family.  Patient is fully compliant with previously prescribed medications including Eliquis (for A-fib).  Prior medical history is significant for  for DM2, HTN, history of CVA, paroxysmal atrial fibrillation on Eliquis, cognitive impairment, history of breast cancer s/p right mastectomy 1999, left lumpectomy 2020, left mastectomy 2022 with DCIS.  The history is provided by the patient and medical records.       Home Medications Prior to Admission medications   Medication Sig Start Date End Date Taking? Authorizing Provider  apixaban (ELIQUIS) 5 MG TABS tablet Take 5 mg by mouth 2 (two) times daily. 06/23/21   [provider]  atorvastatin (LIPITOR) 80 MG tablet Take 80 mg by mouth daily.    [provider]  brimonidine-timolol (COMBIGAN) 0.2-0.5 % ophthalmic solution Place 1 drop into the left eye every 12 (twelve) hours.    [provider]  calcium carbonate (OSCAL) 1500 (600 Ca) MG TABS tablet Take 1,500 mg by mouth daily with breakfast.    [provider]  dexamethasone (DECADRON) 4 MG tablet Take 1 tablet (4 mg total) by mouth every 12 (twelve) hours. 02/18/23 03/20/23  [place] Donnamarie Poag, DO  diltiazem (TIAZAC)  180 MG 24 hr capsule Take 180 mg by mouth daily. 02/13/23   [provider]  dorzolamide (TRUSOPT) 2 % ophthalmic solution Place 1 drop into the left eye 2 (two) times daily. 10/19/20   [provider]  Ferrous Sulfate (IRON PO) Take 1 tablet by mouth daily.    [provider]  hydrochlorothiazide (HYDRODIURIL) 12.5 MG tablet Take 12.5 mg by mouth daily. 01/24/23   [provider]  magnesium 30 MG tablet Take 30 mg by mouth daily.    [provider]  memantine (NAMENDA) 10 MG tablet Take 1 tablet (10 mg total) by mouth 2 (two) times daily. 02/04/23   Frann Rider, NP  metFORMIN (GLUCOPHAGE) 500 MG tablet Take 500 mg by mouth 2 (two) times daily with a meal. 08/18/11   [provider]  ofloxacin (OCUFLOX) 0.3 % ophthalmic solution Place 1 drop into the right eye 4 (four) times daily. 02/06/23   [provider]  prednisoLONE acetate (PRED FORTE) 1 % ophthalmic suspension Place 1 drop into the right eye 4 (four) times daily. 07/18/22   [provider]  pregabalin (LYRICA) 75 MG capsule Take 75 mg by mouth 3 (three) times daily. 02/13/23   [provider]  ROCKLATAN 0.02-0.005 % SOLN Place 1 drop into the left eye at bedtime. 01/31/23   [provider]  Tafamidis (VYNDAMAX) 61 MG CAPS Take 61 mg by mouth daily. 04/27/22   Croitoru, Mihai, MD  vitamin B-12 (CYANOCOBALAMIN) 1000 MCG tablet Take 1,000 mcg by mouth daily.  [provider]  vitamin C (ASCORBIC ACID) 500 MG tablet Take 500 mg by mouth daily.    [provider]      Allergies    Gabapentin    Review of Systems   Review of Systems  All other systems reviewed and are negative.   Physical Exam Updated Vital Signs BP (!) 134/92   Pulse (!) 48   Temp 97.6 F (36.4 C) (Oral)   Resp (!) 23   Ht 5\' 3"  (1.6 m)   Wt 69.7 kg   SpO2 98%   BMI 27.24 kg/m  Physical Exam Vitals and nursing note reviewed.  Constitutional:      General: She  is not in acute distress.    Appearance: Normal appearance. She is well-developed.  HENT:     Head: Normocephalic and atraumatic.  Eyes:     Conjunctiva/sclera: Conjunctivae normal.     Pupils: Pupils are equal, round, and reactive to light.  Cardiovascular:     Rate and Rhythm: Normal rate and regular rhythm.     Heart sounds: Normal heart sounds.  Pulmonary:     Effort: Pulmonary effort is normal. No respiratory distress.     Breath sounds: Normal breath sounds.  Abdominal:     General: There is no distension.     Palpations: Abdomen is soft.     Tenderness: There is no abdominal tenderness.  Musculoskeletal:        General: No deformity. Normal range of motion.     Cervical back: Normal range of motion and neck supple.     Right lower leg: Edema present.     Left lower leg: Edema present.     Comments: 2+ bilateral pitting edema noted to the feet and anterior shins  Skin:    General: Skin is warm and dry.  Neurological:     General: No focal deficit present.     Mental Status: She is alert and oriented to person, place, and time. Mental status is at baseline.    ED Results / Procedures / Treatments   Labs (all labs ordered are listed, but only abnormal results are displayed) Labs Reviewed  CBC WITH DIFFERENTIAL/PLATELET - Abnormal; Notable for the following components:      Result Value   WBC 15.0 (*)    RDW 17.5 (*)    Neutro Abs 13.7 (*)    Lymphs Abs 0.5 (*)    Abs Immature Granulocytes 0.16 (*)    All other components within normal limits  BRAIN NATRIURETIC PEPTIDE  LIPASE, BLOOD  COMPREHENSIVE METABOLIC PANEL    EKG EKG Interpretation  Date/Time:  Wednesday March 06 2023 20:11:48 EDT Ventricular Rate:  108 PR Interval:  136 QRS Duration: 84 QT Interval:  308 QTC Calculation: 413 R Axis:   -11 Text Interpretation: Sinus tachycardia with irregular rate Inferior infarct, old Anterior Q waves, possibly due to LVH Confirmed by Dene Gentry [phone]) on  03/06/2023 8:57:28 PM  Radiology DG Chest 2 View  Result Date: 03/06/2023 CLINICAL DATA:  SOB EXAM: CHEST - 2 VIEW COMPARISON:  04/29/2021. FINDINGS: Cardiac silhouette enlarged. No evidence of pneumothorax or pleural effusion. No evidence of pulmonary edema. Aorta is calcified. There are thoracic degenerative changes. Postop changes right axilla. IMPRESSION: Mildly enlarged cardiac silhouette.  No focal consolidation. Electronically Signed   By: Sammie Bench M.D.   On: 03/06/2023 19:22    Procedures Procedures    Medications Ordered in ED Medications - No data to display  ED  Course/ Medical Decision Making/ A&P                             Medical Decision Making Amount and/or Complexity of Data Reviewed Labs: ordered.  Risk Prescription drug management.    Medical Screen Complete  This patient presented to the ED with complaint of lower extremity edema.  This complaint involves an extensive number of treatment options. The initial differential diagnosis includes, but is not limited to, metabolic abnormality  This presentation is: Acute, Self-Limited, Previously Undiagnosed, Uncertain Prognosis, Complicated, Systemic Symptoms, and Threat to Life/Bodily Function  Patient is presenting from [facility] clinic with reported 2 to 3 days of increased lower extremity edema.  Patient is without specific complaint.  Patient reports full compliance with previously prescribed Eliquis and HCTZ.  Labs pending.  Oncoming EDP ([facility]) aware of case.     Additional history obtained:  Additional history obtained from [facility] External records from outside sources obtained and reviewed including prior ED visits and prior Inpatient records.  Problem List / ED Course:  Edema  Disposition:  After consideration of the diagnostic results and the patients response to treatment, I feel that the patent would benefit from completion of workup.          Final Clinical  Impression(s) / ED Diagnoses Final diagnoses:  Leg edema    Rx / DC Orders ED Discharge Orders     None         Valarie Merino, MD 03/08/23 [phone]

## 2023-03-06 NOTE — ED Triage Notes (Signed)
Patient arrives in wheelchair with daughter after leaving [place] in clinic for bilateral leg, feet and abdomen swelling x 2 days. No tests performed at clinic PTA. Daughter states patient on diuretic and has not missed any doses.

## 2023-03-06 NOTE — ED Provider Triage Note (Signed)
Emergency Medicine Provider Triage Evaluation Note  Amanda Green , a 88 y.o. female  was evaluated in triage.  Pt complains of BL leg swelling, abdomen is swollen and pain. Went to [place] clinic and sent here for further management. No missed doses of lasix. Anticoagulated on eliquis due to Afib.   Review of Systems  Positive: Leg swelling, abdomen distention, sob Negative: Chest pain  Physical Exam  BP 117/72   Pulse 99   Temp 98.4 F (36.9 C) (Oral)   Resp 16   Ht 5\' 3"  (1.6 m)   Wt 69.7 kg   SpO2 96%   BMI 27.24 kg/m  Gen:   Awake, no distress   Resp:  Normal effort  MSK:   Moves extremities without difficulty  Other:  1+ pitting edema  Medical Decision Making  Medically screening exam initiated at 6:49 PM.  Appropriate orders placed.  [name] was informed that the remainder of the evaluation will be completed by another provider, this initial triage assessment does not replace that evaluation, and the importance of remaining in the ED until their evaluation is complete.     Janeece Fitting, PA-C 03/06/23 1851

## 2023-03-07 LAB — CBG MONITORING, ED: Glucose-Capillary: 214 mg/dL — ABNORMAL HIGH (ref 70–99)

## 2023-03-07 LAB — BRAIN NATRIURETIC PEPTIDE: B Natriuretic Peptide: 87 pg/mL (ref 0.0–100.0)

## 2023-03-07 NOTE — ED Notes (Signed)
Re-Drew Lav sent to lab

## 2023-03-07 NOTE — ED Notes (Signed)
Will perform US PIV shortly. 

## 2023-03-07 NOTE — ED Provider Notes (Signed)
I assumed care of this patient.  Please see previous provider note for further details of Hx, PE.  Briefly patient is a 88 y.o. female who presented with BLE, pending renal function and BnP.   Labs reassuring with intact renal function and negative BnP. Recommended compression stocking with elevation. PCP follow to adjust diuretic if needed.  The patient appears reasonably screened and/or stabilized for discharge and I doubt any other medical condition or other [facility] requiring further screening, evaluation, or treatment in the ED at this time. I have discussed the findings, Dx and Tx plan with the patient/family who expressed understanding and agree(s) with the plan. Discharge instructions discussed at length. The patient/family was given strict return precautions who verbalized understanding of the instructions. No further questions at time of discharge.  Disposition: Discharge  Condition: Good  ED Discharge Orders     None        Follow Up: Benay Pike, MD [place] [phone]  Call  to schedule an appointment for close follow up          Sherri Levenhagen, Grayce Sessions, MD 03/07/23 [phone]

## 2023-03-07 NOTE — Progress Notes (Signed)
                                                                                                                                                             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  She developed fatigue during treatment.  Plan: The patient will receive a call in about one month from the radiation oncology department. She will continue follow up with Dr. Chryl Heck as well. She will also be followed in the brain oncology program with routine surveillance MRIs.   ________________________________________________    Carola Rhine, PAC

## 2023-03-08 DIAGNOSIS — E7800 Pure hypercholesterolemia, unspecified: Secondary | ICD-10-CM | POA: Diagnosis not present

## 2023-03-08 DIAGNOSIS — I10 Essential (primary) hypertension: Secondary | ICD-10-CM | POA: Diagnosis not present

## 2023-03-08 DIAGNOSIS — E1169 Type 2 diabetes mellitus with other specified complication: Secondary | ICD-10-CM | POA: Diagnosis not present

## 2023-03-08 DIAGNOSIS — I4891 Unspecified atrial fibrillation: Secondary | ICD-10-CM | POA: Diagnosis not present

## 2023-03-09 ENCOUNTER — Inpatient Hospital Stay (HOSPITAL_COMMUNITY)
Admission: EM | Admit: 2023-03-09 | Discharge: 2023-03-25 | DRG: 291 | Disposition: A | Discharge status: Skilled Nursing Facility | Payer: Medicare PPO | Attending: Internal Medicine | Admitting: Internal Medicine

## 2023-03-09 ENCOUNTER — Encounter (HOSPITAL_COMMUNITY): Payer: Self-pay | Admitting: *Deleted

## 2023-03-09 ENCOUNTER — Inpatient Hospital Stay (HOSPITAL_COMMUNITY): Payer: Medicare PPO

## 2023-03-09 ENCOUNTER — Emergency Department (HOSPITAL_COMMUNITY): Payer: Medicare PPO

## 2023-03-09 ENCOUNTER — Other Ambulatory Visit: Payer: Self-pay

## 2023-03-09 DIAGNOSIS — C50919 Malignant neoplasm of unspecified site of unspecified female breast: Secondary | ICD-10-CM | POA: Diagnosis not present

## 2023-03-09 DIAGNOSIS — I5023 Acute on chronic systolic (congestive) heart failure: Secondary | ICD-10-CM | POA: Diagnosis not present

## 2023-03-09 DIAGNOSIS — E875 Hyperkalemia: Secondary | ICD-10-CM | POA: Diagnosis not present

## 2023-03-09 DIAGNOSIS — D0512 Intraductal carcinoma in situ of left breast: Secondary | ICD-10-CM | POA: Diagnosis not present

## 2023-03-09 DIAGNOSIS — Z8673 Personal history of transient ischemic attack (TIA), and cerebral infarction without residual deficits: Secondary | ICD-10-CM

## 2023-03-09 DIAGNOSIS — I10 Essential (primary) hypertension: Secondary | ICD-10-CM | POA: Diagnosis present

## 2023-03-09 DIAGNOSIS — Z79899 Other long term (current) drug therapy: Secondary | ICD-10-CM | POA: Diagnosis not present

## 2023-03-09 DIAGNOSIS — N179 Acute kidney failure, unspecified: Secondary | ICD-10-CM | POA: Diagnosis not present

## 2023-03-09 DIAGNOSIS — D72829 Elevated white blood cell count, unspecified: Secondary | ICD-10-CM | POA: Diagnosis not present

## 2023-03-09 DIAGNOSIS — I5033 Acute on chronic diastolic (congestive) heart failure: Secondary | ICD-10-CM | POA: Diagnosis present

## 2023-03-09 DIAGNOSIS — Z9013 Acquired absence of bilateral breasts and nipples: Secondary | ICD-10-CM | POA: Diagnosis not present

## 2023-03-09 DIAGNOSIS — Z1501 Genetic susceptibility to malignant neoplasm of breast: Secondary | ICD-10-CM

## 2023-03-09 DIAGNOSIS — C7931 Secondary malignant neoplasm of brain: Secondary | ICD-10-CM | POA: Diagnosis not present

## 2023-03-09 DIAGNOSIS — T380X5A Adverse effect of glucocorticoids and synthetic analogues, initial encounter: Secondary | ICD-10-CM | POA: Diagnosis present

## 2023-03-09 DIAGNOSIS — R319 Hematuria, unspecified: Secondary | ICD-10-CM | POA: Diagnosis not present

## 2023-03-09 DIAGNOSIS — F0390 Unspecified dementia without behavioral disturbance: Secondary | ICD-10-CM | POA: Diagnosis present

## 2023-03-09 DIAGNOSIS — Z515 Encounter for palliative care: Secondary | ICD-10-CM | POA: Diagnosis not present

## 2023-03-09 DIAGNOSIS — L8990 Pressure ulcer of unspecified site, unspecified stage: Secondary | ICD-10-CM | POA: Diagnosis present

## 2023-03-09 DIAGNOSIS — E871 Hypo-osmolality and hyponatremia: Secondary | ICD-10-CM | POA: Diagnosis not present

## 2023-03-09 DIAGNOSIS — R0602 Shortness of breath: Secondary | ICD-10-CM | POA: Diagnosis not present

## 2023-03-09 DIAGNOSIS — Z8042 Family history of malignant neoplasm of prostate: Secondary | ICD-10-CM

## 2023-03-09 DIAGNOSIS — C50912 Malignant neoplasm of unspecified site of left female breast: Secondary | ICD-10-CM | POA: Diagnosis not present

## 2023-03-09 DIAGNOSIS — Z96652 Presence of left artificial knee joint: Secondary | ICD-10-CM | POA: Diagnosis present

## 2023-03-09 DIAGNOSIS — D696 Thrombocytopenia, unspecified: Secondary | ICD-10-CM | POA: Diagnosis present

## 2023-03-09 DIAGNOSIS — I480 Paroxysmal atrial fibrillation: Secondary | ICD-10-CM | POA: Diagnosis not present

## 2023-03-09 DIAGNOSIS — R4189 Other symptoms and signs involving cognitive functions and awareness: Secondary | ICD-10-CM | POA: Diagnosis not present

## 2023-03-09 DIAGNOSIS — Z7984 Long term (current) use of oral hypoglycemic drugs: Secondary | ICD-10-CM

## 2023-03-09 DIAGNOSIS — E785 Hyperlipidemia, unspecified: Secondary | ICD-10-CM | POA: Diagnosis present

## 2023-03-09 DIAGNOSIS — Z800 Family history of malignant neoplasm of digestive organs: Secondary | ICD-10-CM

## 2023-03-09 DIAGNOSIS — G9340 Encephalopathy, unspecified: Secondary | ICD-10-CM | POA: Diagnosis not present

## 2023-03-09 DIAGNOSIS — G9341 Metabolic encephalopathy: Secondary | ICD-10-CM | POA: Diagnosis not present

## 2023-03-09 DIAGNOSIS — I2489 Other forms of acute ischemic heart disease: Secondary | ICD-10-CM | POA: Diagnosis not present

## 2023-03-09 DIAGNOSIS — M6281 Muscle weakness (generalized): Secondary | ICD-10-CM | POA: Diagnosis not present

## 2023-03-09 DIAGNOSIS — Z888 Allergy status to other drugs, medicaments and biological substances status: Secondary | ICD-10-CM

## 2023-03-09 DIAGNOSIS — E854 Organ-limited amyloidosis: Secondary | ICD-10-CM | POA: Diagnosis not present

## 2023-03-09 DIAGNOSIS — Z853 Personal history of malignant neoplasm of breast: Secondary | ICD-10-CM

## 2023-03-09 DIAGNOSIS — E1165 Type 2 diabetes mellitus with hyperglycemia: Secondary | ICD-10-CM | POA: Diagnosis present

## 2023-03-09 DIAGNOSIS — I110 Hypertensive heart disease with heart failure: Secondary | ICD-10-CM | POA: Diagnosis not present

## 2023-03-09 DIAGNOSIS — L89312 Pressure ulcer of right buttock, stage 2: Secondary | ICD-10-CM | POA: Diagnosis present

## 2023-03-09 DIAGNOSIS — I43 Cardiomyopathy in diseases classified elsewhere: Secondary | ICD-10-CM | POA: Diagnosis present

## 2023-03-09 DIAGNOSIS — I4891 Unspecified atrial fibrillation: Secondary | ICD-10-CM | POA: Diagnosis not present

## 2023-03-09 DIAGNOSIS — R059 Cough, unspecified: Secondary | ICD-10-CM | POA: Diagnosis not present

## 2023-03-09 DIAGNOSIS — I679 Cerebrovascular disease, unspecified: Secondary | ICD-10-CM | POA: Diagnosis not present

## 2023-03-09 DIAGNOSIS — E119 Type 2 diabetes mellitus without complications: Secondary | ICD-10-CM | POA: Diagnosis not present

## 2023-03-09 DIAGNOSIS — Z1152 Encounter for screening for COVID-19: Secondary | ICD-10-CM | POA: Diagnosis not present

## 2023-03-09 DIAGNOSIS — R4182 Altered mental status, unspecified: Secondary | ICD-10-CM | POA: Diagnosis not present

## 2023-03-09 DIAGNOSIS — R339 Retention of urine, unspecified: Secondary | ICD-10-CM | POA: Diagnosis not present

## 2023-03-09 DIAGNOSIS — I959 Hypotension, unspecified: Secondary | ICD-10-CM | POA: Diagnosis not present

## 2023-03-09 DIAGNOSIS — J90 Pleural effusion, not elsewhere classified: Secondary | ICD-10-CM | POA: Diagnosis not present

## 2023-03-09 DIAGNOSIS — Z7189 Other specified counseling: Secondary | ICD-10-CM | POA: Diagnosis not present

## 2023-03-09 DIAGNOSIS — Z7901 Long term (current) use of anticoagulants: Secondary | ICD-10-CM | POA: Diagnosis not present

## 2023-03-09 DIAGNOSIS — R609 Edema, unspecified: Secondary | ICD-10-CM | POA: Diagnosis not present

## 2023-03-09 DIAGNOSIS — R7989 Other specified abnormal findings of blood chemistry: Secondary | ICD-10-CM | POA: Insufficient documentation

## 2023-03-09 DIAGNOSIS — I509 Heart failure, unspecified: Secondary | ICD-10-CM | POA: Diagnosis not present

## 2023-03-09 DIAGNOSIS — E46 Unspecified protein-calorie malnutrition: Secondary | ICD-10-CM | POA: Diagnosis not present

## 2023-03-09 DIAGNOSIS — I499 Cardiac arrhythmia, unspecified: Secondary | ICD-10-CM | POA: Diagnosis not present

## 2023-03-09 DIAGNOSIS — Z7401 Bed confinement status: Secondary | ICD-10-CM | POA: Diagnosis not present

## 2023-03-09 LAB — RESP PANEL BY RT-PCR (RSV, FLU A&B, COVID)  RVPGX2
Influenza A by PCR: NEGATIVE
Influenza B by PCR: NEGATIVE
Resp Syncytial Virus by PCR: NEGATIVE
SARS Coronavirus 2 by RT PCR: NEGATIVE

## 2023-03-09 LAB — CBC WITH DIFFERENTIAL/PLATELET
Abs Immature Granulocytes: 0.13 10*3/uL — ABNORMAL HIGH (ref 0.00–0.07)
Basophils Absolute: 0 10*3/uL (ref 0.0–0.1)
Basophils Relative: 0 %
Eosinophils Absolute: 0 10*3/uL (ref 0.0–0.5)
Eosinophils Relative: 0 %
HCT: 42.9 % (ref 36.0–46.0)
Hemoglobin: 14 g/dL (ref 12.0–15.0)
Immature Granulocytes: 1 %
Lymphocytes Relative: 3 %
Lymphs Abs: 0.4 10*3/uL — ABNORMAL LOW (ref 0.7–4.0)
MCH: 29.4 pg (ref 26.0–34.0)
MCHC: 32.6 g/dL (ref 30.0–36.0)
MCV: 89.9 fL (ref 80.0–100.0)
Monocytes Absolute: 0.5 10*3/uL (ref 0.1–1.0)
Monocytes Relative: 4 %
Neutro Abs: 11.2 10*3/uL — ABNORMAL HIGH (ref 1.7–7.7)
Neutrophils Relative %: 92 %
Platelets: 149 10*3/uL — ABNORMAL LOW (ref 150–400)
RBC: 4.77 MIL/uL (ref 3.87–5.11)
RDW: 17.3 % — ABNORMAL HIGH (ref 11.5–15.5)
WBC: 12.3 10*3/uL — ABNORMAL HIGH (ref 4.0–10.5)
nRBC: 0 % (ref 0.0–0.2)

## 2023-03-09 LAB — TROPONIN I (HIGH SENSITIVITY)
Troponin I (High Sensitivity): 72 ng/L — ABNORMAL HIGH (ref <18)
Troponin I (High Sensitivity): 51 ng/L — ABNORMAL HIGH (ref <18)

## 2023-03-09 LAB — GLUCOSE, CAPILLARY
Glucose-Capillary: 175 mg/dL — ABNORMAL HIGH (ref 70–99)
Glucose-Capillary: 168 mg/dL — ABNORMAL HIGH (ref 70–99)

## 2023-03-09 LAB — I-STAT VENOUS BLOOD GAS, ED
Acid-Base Excess: 0 mmol/L (ref 0.0–2.0)
Bicarbonate: 21.9 mmol/L (ref 20.0–28.0)
Calcium, Ion: 1.06 mmol/L — ABNORMAL LOW (ref 1.15–1.40)
HCT: 42 % (ref 36.0–46.0)
Hemoglobin: 14.3 g/dL (ref 12.0–15.0)
O2 Saturation: 100 %
Potassium: 4.3 mmol/L (ref 3.5–5.1)
Sodium: 127 mmol/L — ABNORMAL LOW (ref 135–145)
TCO2: 23 mmol/L (ref 22–32)
pCO2, Ven: 27.3 mmHg — ABNORMAL LOW (ref 44–60)
pH, Ven: 7.511 — ABNORMAL HIGH (ref 7.25–7.43)
pO2, Ven: 157 mmHg — ABNORMAL HIGH (ref 32–45)

## 2023-03-09 LAB — COMPREHENSIVE METABOLIC PANEL
ALT: 27 U/L (ref 0–44)
AST: 34 U/L (ref 15–41)
Albumin: 2.4 g/dL — ABNORMAL LOW (ref 3.5–5.0)
Alkaline Phosphatase: 50 U/L (ref 38–126)
Anion gap: 7 (ref 5–15)
BUN: 26 mg/dL — ABNORMAL HIGH (ref 8–23)
CO2: 23 mmol/L (ref 22–32)
Calcium: 8.9 mg/dL (ref 8.9–10.3)
Chloride: 95 mmol/L — ABNORMAL LOW (ref 98–111)
Creatinine, Ser: 0.89 mg/dL (ref 0.44–1.00)
GFR, Estimated: >60 mL/min (ref >60)
Glucose, Bld: 254 mg/dL — ABNORMAL HIGH (ref 70–99)
Potassium: 4.3 mmol/L (ref 3.5–5.1)
Sodium: 125 mmol/L — ABNORMAL LOW (ref 135–145)
Total Bilirubin: 1.3 mg/dL — ABNORMAL HIGH (ref 0.3–1.2)
Total Protein: 4.7 g/dL — ABNORMAL LOW (ref 6.5–8.1)

## 2023-03-09 LAB — BRAIN NATRIURETIC PEPTIDE: B Natriuretic Peptide: 660.9 pg/mL — ABNORMAL HIGH (ref 0.0–100.0)

## 2023-03-09 LAB — TSH: TSH: 1.813 u[IU]/mL (ref 0.350–4.500)

## 2023-03-09 LAB — PROTIME-INR
INR: 1.7 — ABNORMAL HIGH (ref 0.8–1.2)
Prothrombin Time: 19.7 seconds — ABNORMAL HIGH (ref 11.4–15.2)

## 2023-03-09 LAB — MAGNESIUM: Magnesium: 2.1 mg/dL (ref 1.7–2.4)

## 2023-03-09 LAB — CBG MONITORING, ED: Glucose-Capillary: 199 mg/dL — ABNORMAL HIGH (ref 70–99)

## 2023-03-09 MED ORDER — OFLOXACIN 0.3 % OP SOLN
1.0000 [drp] | Freq: Four times a day (QID) | OPHTHALMIC | Status: DC
  Administered 2023-03-09 – 2023-03-25 (×63): 1 [drp] via OPHTHALMIC
  Filled 2023-03-09: qty 5

## 2023-03-09 MED ORDER — FUROSEMIDE 10 MG/ML IJ SOLN
40.0000 mg | Freq: Every day | INTRAMUSCULAR | Status: DC
  Administered 2023-03-10: 40 mg via INTRAVENOUS
  Filled 2023-03-09: qty 4

## 2023-03-09 MED ORDER — LACTATED RINGERS IV BOLUS: 1000.0000 mL | Freq: Once | INTRAVENOUS | Status: DC

## 2023-03-09 MED ORDER — INSULIN ASPART 100 UNIT/ML IJ SOLN
0.0000 [IU] | Freq: Three times a day (TID) | INTRAMUSCULAR | Status: DC
  Administered 2023-03-09 – 2023-03-10 (×2): 2 [IU] via SUBCUTANEOUS
  Administered 2023-03-10: 5 [IU] via SUBCUTANEOUS
  Administered 2023-03-10: 7 [IU] via SUBCUTANEOUS
  Administered 2023-03-11: 2 [IU] via SUBCUTANEOUS
  Administered 2023-03-11 – 2023-03-12 (×3): 1 [IU] via SUBCUTANEOUS
  Administered 2023-03-13: 3 [IU] via SUBCUTANEOUS
  Administered 2023-03-13 – 2023-03-14 (×2): 2 [IU] via SUBCUTANEOUS
  Administered 2023-03-14 – 2023-03-15 (×2): 1 [IU] via SUBCUTANEOUS
  Administered 2023-03-15 – 2023-03-16 (×3): 2 [IU] via SUBCUTANEOUS
  Administered 2023-03-16 – 2023-03-17 (×2): 1 [IU] via SUBCUTANEOUS
  Administered 2023-03-17 – 2023-03-19 (×5): 2 [IU] via SUBCUTANEOUS
  Administered 2023-03-19: 1 [IU] via SUBCUTANEOUS
  Administered 2023-03-19: 2 [IU] via SUBCUTANEOUS
  Administered 2023-03-20: 3 [IU] via SUBCUTANEOUS
  Administered 2023-03-20: 1 [IU] via SUBCUTANEOUS
  Administered 2023-03-20: 2 [IU] via SUBCUTANEOUS
  Administered 2023-03-21: 3 [IU] via SUBCUTANEOUS
  Administered 2023-03-21: 2 [IU] via SUBCUTANEOUS
  Administered 2023-03-21: 1 [IU] via SUBCUTANEOUS
  Administered 2023-03-22: 2 [IU] via SUBCUTANEOUS
  Administered 2023-03-22: 3 [IU] via SUBCUTANEOUS
  Administered 2023-03-23 – 2023-03-24 (×3): 2 [IU] via SUBCUTANEOUS
  Administered 2023-03-25: 1 [IU] via SUBCUTANEOUS
  Administered 2023-03-25: 3 [IU] via SUBCUTANEOUS

## 2023-03-09 MED ORDER — METOPROLOL TARTRATE 25 MG PO TABS
12.5000 mg | ORAL_TABLET | Freq: Three times a day (TID) | ORAL | Status: DC | PRN
  Administered 2023-03-10: 12.5 mg via ORAL
  Filled 2023-03-09 (×2): qty 1

## 2023-03-09 MED ORDER — DILTIAZEM HCL-DEXTROSE 125-5 MG/125ML-% IV SOLN (PREMIX)
5.0000 mg/h | INTRAVENOUS | Status: DC
  Administered 2023-03-09: 5 mg/h via INTRAVENOUS
  Filled 2023-03-09: qty 125

## 2023-03-09 MED ORDER — LOPERAMIDE HCL 2 MG PO CAPS: 2.0000 mg | ORAL_CAPSULE | Freq: Once | ORAL | Status: DC

## 2023-03-09 MED ORDER — DILTIAZEM HCL ER COATED BEADS 180 MG PO CP24: 180.0000 mg | ORAL_CAPSULE | Freq: Every day | ORAL | Status: DC

## 2023-03-09 MED ORDER — TIMOLOL MALEATE 0.5 % OP SOLN
1.0000 [drp] | Freq: Two times a day (BID) | OPHTHALMIC | Status: DC
  Administered 2023-03-09 – 2023-03-25 (×33): 1 [drp] via OPHTHALMIC
  Filled 2023-03-09: qty 5

## 2023-03-09 MED ORDER — ACETAMINOPHEN 650 MG RE SUPP: 650.0000 mg | Freq: Four times a day (QID) | RECTAL | Status: DC | PRN

## 2023-03-09 MED ORDER — DILTIAZEM HCL ER BEADS 180 MG PO CP24: 180.0000 mg | ORAL_CAPSULE | Freq: Every day | ORAL | Status: DC

## 2023-03-09 MED ORDER — DEXAMETHASONE 4 MG PO TABS
2.0000 mg | ORAL_TABLET | Freq: Two times a day (BID) | ORAL | Status: DC
  Administered 2023-03-09 – 2023-03-11 (×5): 2 mg via ORAL
  Filled 2023-03-09 (×6): qty 0.5

## 2023-03-09 MED ORDER — DILTIAZEM LOAD VIA INFUSION
15.0000 mg | Freq: Once | INTRAVENOUS | Status: AC
  Administered 2023-03-09: 15 mg via INTRAVENOUS
  Filled 2023-03-09: qty 15

## 2023-03-09 MED ORDER — IOHEXOL 350 MG/ML SOLN
75.0000 mL | Freq: Once | INTRAVENOUS | Status: AC | PRN
  Administered 2023-03-09: 75 mL via INTRAVENOUS

## 2023-03-09 MED ORDER — PREGABALIN 25 MG PO CAPS
75.0000 mg | ORAL_CAPSULE | Freq: Three times a day (TID) | ORAL | Status: DC
  Administered 2023-03-09 – 2023-03-10 (×4): 75 mg via ORAL
  Filled 2023-03-09 (×6): qty 3

## 2023-03-09 MED ORDER — MEMANTINE HCL 10 MG PO TABS
10.0000 mg | ORAL_TABLET | Freq: Two times a day (BID) | ORAL | Status: DC
  Administered 2023-03-09 – 2023-03-12 (×6): 10 mg via ORAL
  Filled 2023-03-09 (×6): qty 1

## 2023-03-09 MED ORDER — ACETAMINOPHEN 325 MG PO TABS
650.0000 mg | ORAL_TABLET | Freq: Four times a day (QID) | ORAL | Status: DC | PRN
  Administered 2023-03-10: 650 mg via ORAL
  Filled 2023-03-09: qty 2

## 2023-03-09 MED ORDER — NETARSUDIL-LATANOPROST 0.02-0.005 % OP SOLN: 1.0000 [drp] | Freq: Every day | OPHTHALMIC | Status: DC

## 2023-03-09 MED ORDER — ATORVASTATIN CALCIUM 40 MG PO TABS
80.0000 mg | ORAL_TABLET | Freq: Every day | ORAL | Status: DC
  Administered 2023-03-10 – 2023-03-12 (×3): 80 mg via ORAL
  Filled 2023-03-09 (×3): qty 2

## 2023-03-09 MED ORDER — TAFAMIDIS 61 MG PO CAPS
61.0000 mg | ORAL_CAPSULE | Freq: Every day | ORAL | Status: DC
  Administered 2023-03-11 – 2023-03-25 (×14): 61 mg via ORAL
  Filled 2023-03-09 (×17): qty 1

## 2023-03-09 MED ORDER — BRIMONIDINE TARTRATE-TIMOLOL 0.2-0.5 % OP SOLN: 1.0000 [drp] | Freq: Two times a day (BID) | OPHTHALMIC | Status: DC

## 2023-03-09 MED ORDER — APIXABAN 5 MG PO TABS
5.0000 mg | ORAL_TABLET | Freq: Two times a day (BID) | ORAL | Status: DC
  Administered 2023-03-09 – 2023-03-10 (×3): 5 mg via ORAL
  Filled 2023-03-09 (×4): qty 1

## 2023-03-09 MED ORDER — PREDNISOLONE ACETATE 1 % OP SUSP
1.0000 [drp] | Freq: Four times a day (QID) | OPHTHALMIC | Status: DC
  Administered 2023-03-09 – 2023-03-25 (×63): 1 [drp] via OPHTHALMIC
  Filled 2023-03-09: qty 5

## 2023-03-09 MED ORDER — MORPHINE SULFATE (PF) 4 MG/ML IV SOLN: 4.0000 mg | Freq: Once | INTRAVENOUS | Status: DC

## 2023-03-09 MED ORDER — BRIMONIDINE TARTRATE 0.2 % OP SOLN
1.0000 [drp] | Freq: Two times a day (BID) | OPHTHALMIC | Status: DC
  Administered 2023-03-09 – 2023-03-25 (×33): 1 [drp] via OPHTHALMIC
  Filled 2023-03-09: qty 5

## 2023-03-09 MED ORDER — FUROSEMIDE 10 MG/ML IJ SOLN
40.0000 mg | Freq: Once | INTRAMUSCULAR | Status: AC
  Administered 2023-03-09: 40 mg via INTRAVENOUS
  Filled 2023-03-09: qty 4

## 2023-03-09 MED ORDER — DORZOLAMIDE HCL 2 % OP SOLN
1.0000 [drp] | Freq: Two times a day (BID) | OPHTHALMIC | Status: DC
  Administered 2023-03-09 – 2023-03-25 (×33): 1 [drp] via OPHTHALMIC
  Filled 2023-03-09: qty 10

## 2023-03-09 MED ORDER — SODIUM CHLORIDE 0.9 % IV SOLN: 12.5000 mg | Freq: Four times a day (QID) | INTRAVENOUS | Status: DC | PRN

## 2023-03-09 NOTE — Assessment & Plan Note (Addendum)
Her glucose remained stable during her hospitalization, she was treated with insulin sliding scale for glucose cover and monitoring.

## 2023-03-09 NOTE — Assessment & Plan Note (Addendum)
HER2 positive, with brain metastasis, indicating aggressive disease.  Rapid decline in her performance status, if she has no significant improvement in 2 weeks, recommendation to transition to hospice services.  Continue with dexamethasone, taper per outpatient recommendations.    Thrombocytopenia, with stable plt.

## 2023-03-09 NOTE — ED Provider Notes (Signed)
[facility] Provider Note  CSN: [id number] Arrival date & time: 03/09/23 1016  Chief Complaint(s) Leg Swelling  HPI Amanda Green is a 88 y.o. female with history of single brain metastasis, diabetes, atrial fibrillation on Eliquis, hypertension, hyperlipidemia presenting to the emergency department with leg swelling.  Patient's daughter reports that she has had leg swelling around 3 to 4 days.  She initially went to [facility] emergency department, had a reassuring workup including normal BNP.  Since then, patient's daughter reports that she has had continued leg swelling, as well as weakness and fatigue.  Reports she has been sleeping a lot.  No fevers or chills.  Patient denies any chest pain, difficulty breathing.  No nausea or vomiting.  Reports that she has been compliant with all of her medications.   Past Medical History Past Medical History:  Diagnosis Date   Arthritis    knees,   Breast cancer ([place]) 1999   right mastectomy   Breast cancer, left ([place]) 05/2019   left lumpectomy   Diabetes mellitus    Dislocation of left shoulder joint    Family history of prostate cancer    Hyperlipidemia    Hypertension    Memory loss    Numbness and tingling of right lower extremity    Paresthesia    Stroke ([place])    light stroke - 2012 ,right leg nerve damage    Patient Active Problem List   Diagnosis Date Noted   Neoplasm of brain causing mass effect on adjacent structures ([place]) 02/14/2023   Cardiac amyloidosis ([place]) 02/14/2023   Spinal stenosis of lumbar region with neurogenic claudication 03/15/2022   Gait abnormality 11/13/2021   Low back pain without sciatica 11/13/2021   Ductal carcinoma in situ (DCIS) of breast 05/10/2021   Paroxysmal atrial fibrillation ([place]) 04/14/2021   Essential hypertension 04/14/2021   Controlled type 2 diabetes mellitus without complication, without long-term current use of insulin ([place]) 04/14/2021    Cerebrovascular accident (CVA) ([place]) 02/07/2021   Cognitive impairment 02/07/2021   Genetic testing 09/25/2019   Family history of prostate cancer    Ductal carcinoma in situ (DCIS) of left breast 06/10/2019   Pain 08/13/2018   Trigger ring finger of left hand 08/13/2018   Paresthesia of right lower extremity 01/28/2017   Stroke ([place]) 01/28/2017   Colon polyps 08/15/2016   Chronic right shoulder pain 11/24/2015   Right mastectomy 1999 03/26/2012   Home Medication(s) Prior to Admission medications   Medication Sig Start Date End Date Taking? Authorizing Provider  apixaban (ELIQUIS) 5 MG TABS tablet Take 5 mg by mouth 2 (two) times daily. 06/23/21   [provider]  atorvastatin (LIPITOR) 80 MG tablet Take 80 mg by mouth daily.    [provider]  brimonidine-timolol (COMBIGAN) 0.2-0.5 % ophthalmic solution Place 1 drop into the left eye every 12 (twelve) hours.    [provider]  calcium carbonate (OSCAL) 1500 (600 Ca) MG TABS tablet Take 1,500 mg by mouth daily with breakfast.    [provider]  dexamethasone (DECADRON) 4 MG tablet Take 1 tablet (4 mg total) by mouth every 12 (twelve) hours. 02/18/23 03/20/23  [place] Donnamarie Poag, DO  diltiazem (TIAZAC) 180 MG 24 hr capsule Take 180 mg by mouth daily. 02/13/23   [provider]  dorzolamide (TRUSOPT) 2 % ophthalmic solution Place 1 drop into the left eye 2 (two) times daily. 10/19/20   [provider]  Ferrous Sulfate (IRON  PO) Take 1 tablet by mouth daily.    [provider]  hydrochlorothiazide (HYDRODIURIL) 12.5 MG tablet Take 12.5 mg by mouth daily. 01/24/23   [provider]  magnesium 30 MG tablet Take 30 mg by mouth daily.    [provider]  memantine (NAMENDA) 10 MG tablet Take 1 tablet (10 mg total) by mouth 2 (two) times daily. 02/04/23   Frann Rider, NP  metFORMIN (GLUCOPHAGE) 500 MG tablet Take 500 mg by mouth 2 (two) times daily with a meal. 08/18/11    [provider]  ofloxacin (OCUFLOX) 0.3 % ophthalmic solution Place 1 drop into the right eye 4 (four) times daily. 02/06/23   [provider]  prednisoLONE acetate (PRED FORTE) 1 % ophthalmic suspension Place 1 drop into the right eye 4 (four) times daily. 07/18/22   [provider]  pregabalin (LYRICA) 75 MG capsule Take 75 mg by mouth 3 (three) times daily. 02/13/23   [provider]  ROCKLATAN 0.02-0.005 % SOLN Place 1 drop into the left eye at bedtime. 01/31/23   [provider]  Tafamidis (VYNDAMAX) 61 MG CAPS Take 61 mg by mouth daily. 04/27/22   Croitoru, Mihai, MD  vitamin B-12 (CYANOCOBALAMIN) 1000 MCG tablet Take 1,000 mcg by mouth daily.    [provider]  vitamin C (ASCORBIC ACID) 500 MG tablet Take 500 mg by mouth daily.    [provider]                                                                                                                                    Past Surgical History Past Surgical History:  Procedure Laterality Date   ABDOMINAL HYSTERECTOMY     APPENDECTOMY     BREAST BIOPSY Left 01/19/2010   BREAST LUMPECTOMY WITH RADIOACTIVE SEED LOCALIZATION Left 07/17/2019   Procedure: LEFT BREAST LUMPECTOMY WITH RADIOACTIVE SEED LOCALIZATION;  Surgeon: Johnathan Hausen, MD;  Location: [facility];  Service: General;  Laterality: Left;   BREAST SURGERY     Right mastectomy [place] Right 09/30/2014   Procedure: RIGHT CARPAL TUNNEL RELEASE;  Surgeon: Daryll Brod, MD;  Location: [facility];  Service: Orthopedics;  Laterality: Right;   COLONOSCOPY     EYE SURGERY     cataracts   JOINT REPLACEMENT     lt total knee 2011   LIPOMA EXCISION  11/28/2011   Procedure: EXCISION LIPOMA;  Surgeon: Pedro Earls, MD;  Location: [facility];  Service: General;  Laterality: N/A;  excisino lipoma 4 cm back of neck   MASTECTOMY Right 1999   TOTAL MASTECTOMY Left  05/10/2021   Procedure: LEFT TOTAL MASTECTOMY;  Surgeon: Johnathan Hausen, MD;  Location: [facility];  Service: General;  Laterality: Left;   Family History Family History  Problem Relation Age of Onset   Esophageal cancer Mother  smoker   Other Father        Unsure of medical history.   Prostate cancer Brother        d. 87s   Cancer Son        ?? unsure, but may have had cancer   Cancer Son        ?? unsure, but may have had cancer    Social History Social History   Tobacco Use   Smoking status: Never   Smokeless tobacco: Never  Vaping Use   Vaping Use: Never used  Substance Use Topics   Alcohol use: No   Drug use: No   Allergies Gabapentin  Review of Systems Review of Systems  All other systems reviewed and are negative.   Physical Exam Vital Signs  I have reviewed the triage vital signs BP 97/63   Pulse (!) 109   Temp 98 F (36.7 C)   Resp 19   Ht 5\' 3"  (1.6 m)   Wt 69.7 kg   SpO2 95%   BMI 27.22 kg/m  Physical Exam Vitals and nursing note reviewed.  Constitutional:      General: She is not in acute distress.    Appearance: She is well-developed.  HENT:     Head: Normocephalic and atraumatic.     Mouth/Throat:     Mouth: Mucous membranes are moist.  Eyes:     Pupils: Pupils are equal, round, and reactive to light.  Neck:     Vascular: Hepatojugular reflux and JVD present.  Cardiovascular:     Rate and Rhythm: Tachycardia present. Rhythm irregular.     Heart sounds: No murmur heard. Pulmonary:     Effort: Pulmonary effort is normal.     Breath sounds: Rales (bibasilar crackles) present.  Abdominal:     General: Abdomen is flat.     Palpations: Abdomen is soft.     Tenderness: There is no abdominal tenderness.  Musculoskeletal:        General: No tenderness.     Right lower leg: Edema present.     Left lower leg: Edema present.  Skin:    General: Skin is warm and dry.  Neurological:     General: No focal deficit present.     Mental  Status: She is alert. Mental status is at baseline.  Psychiatric:        Mood and Affect: Mood normal.        Behavior: Behavior normal.     ED Results and Treatments Labs (all labs ordered are listed, but only abnormal results are displayed) Labs Reviewed  CBC WITH DIFFERENTIAL/PLATELET - Abnormal; Notable for the following components:      Result Value   WBC 12.3 (*)    RDW 17.3 (*)    Platelets 149 (*)    Neutro Abs 11.2 (*)    Lymphs Abs 0.4 (*)    Abs Immature Granulocytes 0.13 (*)    All other components within normal limits  BRAIN NATRIURETIC PEPTIDE - Abnormal; Notable for the following components:   B Natriuretic Peptide 660.9 (*)    All other components within normal limits  COMPREHENSIVE METABOLIC PANEL - Abnormal; Notable for the following components:   Sodium 125 (*)    Chloride 95 (*)    Glucose, Bld 254 (*)    BUN 26 (*)    Total Protein 4.7 (*)    Albumin 2.4 (*)    Total Bilirubin 1.3 (*)    All other components within  normal limits  I-STAT VENOUS BLOOD GAS, ED - Abnormal; Notable for the following components:   pH, Ven 7.511 (*)    pCO2, Ven 27.3 (*)    pO2, Ven 157 (*)    Sodium 127 (*)    Calcium, Ion 1.06 (*)    All other components within normal limits  TROPONIN I (HIGH SENSITIVITY) - Abnormal; Notable for the following components:   Troponin I (High Sensitivity) 72 (*)    All other components within normal limits  TROPONIN I (HIGH SENSITIVITY) - Abnormal; Notable for the following components:   Troponin I (High Sensitivity) 51 (*)    All other components within normal limits  RESP PANEL BY RT-PCR (RSV, FLU A&B, COVID)  RVPGX2  URINALYSIS, ROUTINE W REFLEX MICROSCOPIC  PROTIME-INR                                                                                                                          Radiology DG Chest Port 1 View  Result Date: 03/09/2023 CLINICAL DATA:  Cough EXAM: PORTABLE CHEST 1 VIEW COMPARISON:  03/06/2023 FINDINGS:  Cardiac silhouette is prominent. Aorta is calcified. There is left base consolidation or volume loss. No pneumothorax. IMPRESSION: Small effusion.  Prominent cardiac silhouette. Electronically Signed   By: Sammie Bench M.D.   On: 03/09/2023 13:47    Pertinent labs & imaging results that were available during my care of the patient were reviewed by me and considered in my medical decision making (see MDM for details).  Medications Ordered in ED Medications  diltiazem (CARDIZEM) 1 mg/mL load via infusion 15 mg (15 mg Intravenous Bolus from Bag 03/09/23 1315)    And  diltiazem (CARDIZEM) 125 mg in dextrose 5% 125 mL (1 mg/mL) infusion (10 mg/hr Intravenous Rate/Dose Change 03/09/23 1411)  furosemide (LASIX) injection 40 mg (40 mg Intravenous Given 03/09/23 1310)                                                                                                                                     Procedures .Critical Care  Performed by: Cristie Hem, MD Authorized by: Cristie Hem, MD   Critical care provider statement:    Critical care time (minutes):  30   Critical care was necessary to treat or prevent imminent or life-threatening deterioration of the following conditions:  Cardiac failure   Critical care was time spent  personally by me on the following activities:  Development of treatment plan with patient or surrogate, discussions with consultants, evaluation of patient's response to treatment, examination of patient, ordering and review of laboratory studies, ordering and review of radiographic studies, ordering and performing treatments and interventions, pulse oximetry, re-evaluation of patient's condition and review of old charts   Care discussed with: admitting provider     (including critical care time)  Medical Decision Making / ED Course   MDM:  88 year old female presenting to the emergency department with lower extremity swelling.  Patient vital signs notable  for tachycardia, was not really documented but tachycardia up to 160 on monitor.  EKG confirms A-fib with RVR.  Physical exam notable for lower extremity swelling and JVD.  She also has some bibasilar crackles and has a mildly increased respiratory rate although she denies any shortness of breath.  Reports medication compliance.  Suspect likely decompensated CHF in the setting of A-fib with RVR.  BNP is elevated whereas it was not 3 days ago.  Chest x-ray is clear.  Patient not hypoxic.  Patient denies any fevers or chills to suggest underlying infectious cause of her A-fib with RVR.  Placed on diltiazem drip, patient reports she feels better.  Troponin elevated, patient denies ever having chest pain, likely due to A-fib with RVR, improved on recheck with rate control.  Patient will need diuresis and further management of her A-fib with RVR, will admit for this.  Laboratory testing also notable for hyponatremia which may be contributing to the patient's weakness, likely in the setting of volume overload.      Additional history obtained: -Additional history obtained from family -External records from outside source obtained and reviewed including: Chart review including previous notes, labs, imaging, consultation notes including ED note from 3 days ago.   Lab Tests: -I ordered, reviewed, and interpreted labs.   The pertinent results include:   Labs Reviewed  CBC WITH DIFFERENTIAL/PLATELET - Abnormal; Notable for the following components:      Result Value   WBC 12.3 (*)    RDW 17.3 (*)    Platelets 149 (*)    Neutro Abs 11.2 (*)    Lymphs Abs 0.4 (*)    Abs Immature Granulocytes 0.13 (*)    All other components within normal limits  BRAIN NATRIURETIC PEPTIDE - Abnormal; Notable for the following components:   B Natriuretic Peptide 660.9 (*)    All other components within normal limits  COMPREHENSIVE METABOLIC PANEL - Abnormal; Notable for the following components:   Sodium 125 (*)     Chloride 95 (*)    Glucose, Bld 254 (*)    BUN 26 (*)    Total Protein 4.7 (*)    Albumin 2.4 (*)    Total Bilirubin 1.3 (*)    All other components within normal limits  I-STAT VENOUS BLOOD GAS, ED - Abnormal; Notable for the following components:   pH, Ven 7.511 (*)    pCO2, Ven 27.3 (*)    pO2, Ven 157 (*)    Sodium 127 (*)    Calcium, Ion 1.06 (*)    All other components within normal limits  TROPONIN I (HIGH SENSITIVITY) - Abnormal; Notable for the following components:   Troponin I (High Sensitivity) 72 (*)    All other components within normal limits  TROPONIN I (HIGH SENSITIVITY) - Abnormal; Notable for the following components:   Troponin I (High Sensitivity) 51 (*)    All other components within  normal limits  RESP PANEL BY RT-PCR (RSV, FLU A&B, COVID)  RVPGX2  URINALYSIS, ROUTINE W REFLEX MICROSCOPIC  PROTIME-INR    Notable for bated troponin likely demand related, hyponatremia, hyperglycemia, elevated BNP, leukocytosis.  EKG   EKG Interpretation  Date/Time:  Saturday March 09 2023 10:51:49 EDT Ventricular Rate:  131 PR Interval:    QRS Duration: 78 QT Interval:  298 QTC Calculation: 440 R Axis:   -10 Text Interpretation: Atrial fibrillation with rapid ventricular response Anterior infarct , age undetermined T wave abnormality, consider lateral ischemia Abnormal ECG When compared with ECG of 06-Mar-2023 20:11, T wave inversions new Confirmed by Garnette Gunner [phone]) on 03/09/2023 3:35:48 PM         Imaging Studies ordered: I ordered imaging studies including CXR On my interpretation imaging demonstrates no acute process I independently visualized and interpreted imaging. I agree with the radiologist interpretation   Medicines ordered and prescription drug management: Meds ordered this encounter  Medications   AND Linked Order Group    diltiazem (CARDIZEM) 1 mg/mL load via infusion 15 mg    diltiazem (CARDIZEM) 125 mg in dextrose 5% 125 mL (1 mg/mL)  infusion   furosemide (LASIX) injection 40 mg   DISCONTD: promethazine (PHENERGAN) 12.5 mg in sodium chloride 0.9 % 50 mL IVPB   DISCONTD: morphine (PF) 4 MG/ML injection 4 mg   DISCONTD: lactated ringers bolus 1,000 mL   DISCONTD: loperamide (IMODIUM) capsule 2 mg    -I have reviewed the patients home medicines and have made adjustments as needed   Consultations Obtained: I requested consultation with the hospitalist,  and discussed lab and imaging findings as well as pertinent plan - they recommend: admit   Cardiac Monitoring: The patient was maintained on a cardiac monitor.  I personally viewed and interpreted the cardiac monitored which showed an underlying rhythm of: afib with RVR  Reevaluation: After the interventions noted above, I reevaluated the patient and found that their symptoms have improved  Co morbidities that complicate the patient evaluation  Past Medical History:  Diagnosis Date   Arthritis    knees,   Breast cancer ([place]) 1999   right mastectomy   Breast cancer, left ([place]) 05/2019   left lumpectomy   Diabetes mellitus    Dislocation of left shoulder joint    Family history of prostate cancer    Hyperlipidemia    Hypertension    Memory loss    Numbness and tingling of right lower extremity    Paresthesia    Stroke ([place])    light stroke - 2012 ,right leg nerve damage       Dispostion: Disposition decision including need for hospitalization was considered, and patient admitted to the hospital.    Final Clinical Impression(s) / ED Diagnoses Final diagnoses:  Atrial fibrillation with RVR ([place])  Acute on chronic diastolic (congestive) heart failure ([place])     This chart was dictated using voice recognition software.  Despite best efforts to proofread,  errors can occur which can change the documentation meaning.    Cristie Hem, MD 03/09/23 [phone]

## 2023-03-09 NOTE — ED Notes (Signed)
Admitting at the bedside.  

## 2023-03-09 NOTE — ED Notes (Signed)
Blood re collect sent 

## 2023-03-09 NOTE — Assessment & Plan Note (Addendum)
Continue tafamidis 61 mg p.o. daily

## 2023-03-09 NOTE — ED Notes (Signed)
Will redraw a light green lab did not have enough for remainder of the labs

## 2023-03-09 NOTE — Assessment & Plan Note (Deleted)
-  She has had worsening LE swelling, fatigue, dyspnea on exertion and elevated BNP to 660 which was normal 3 days ago with physical exam + for JVD supports acute on chronic diastolic CHF -likely exacerbated by afib with RVR -not on any diuretics at home -continue lasix 40mg  daily and watch I/O -strict I/O and daily weights -echo in 03/2021: normal EF with severe left ventriclar wall thickening. Grade 1 DD.  -repeat echo pending

## 2023-03-09 NOTE — Assessment & Plan Note (Addendum)
Hyponatremia.   Renal function has been stable.  Continue diuresis with furosemide and spironolactone.

## 2023-03-09 NOTE — ED Notes (Signed)
ED TO INPATIENT HANDOFF REPORT  ED Nurse Name and Phone #: [phone]  S Name/Age/Gender Amanda Green 88 y.o. female Room/Bed: 026C/026C  Code Status   Code Status: Prior  Home/SNF/Other Home Patient oriented to: self, place, time, and situation Is this baseline? Yes   Triage Complete: Triage complete  Chief Complaint Atrial fibrillation with RVR ([place]) [I48.91]  Triage Note C/o swelling to lower ext. Onset wed states she was seen at [facility] on Wed and discharged home states she had appointment with PCP and April 5th. States her legs are still swollen thsi am, however they aren't as swollen as before. Patient denies pain   Allergies Allergies  Allergen Reactions   Gabapentin     Drowsiness    Level of Care/Admitting Diagnosis ED Disposition     ED Disposition  Admit   Condition  --   [place]: [place] [[id number]]  Level of Care: Progressive [102]  Admit to Progressive based on following criteria: CARDIOVASCULAR & THORACIC of moderate stability with acute coronary syndrome symptoms/low risk myocardial infarction/hypertensive urgency/arrhythmias/heart failure potentially compromising stability and stable post cardiovascular intervention patients.  May admit patient to [facility] or [facility] if equivalent level of care is available:: No  Covid Evaluation: Asymptomatic - no recent exposure (last 10 days) testing not required  Diagnosis: Atrial fibrillation with RVR [facility]) [id number]  Admitting Physician: Orma Flaming [id number]  Attending Physician: Orma Flaming [id number]  Certification:: I certify this patient will need inpatient services for at least 2 midnights  Estimated Length of Stay: 3          B Medical/Surgery History Past Medical History:  Diagnosis Date   Arthritis    knees,   Breast cancer ([place]) 1999   right mastectomy   Breast cancer, left ([place]) 05/2019   left lumpectomy   Diabetes mellitus     Dislocation of left shoulder joint    Family history of prostate cancer    Hyperlipidemia    Hypertension    Memory loss    Numbness and tingling of right lower extremity    Paresthesia    Stroke ([place])    light stroke - 2012 ,right leg nerve damage    Past Surgical History:  Procedure Laterality Date   ABDOMINAL HYSTERECTOMY     APPENDECTOMY     BREAST BIOPSY Left 01/19/2010   BREAST LUMPECTOMY WITH RADIOACTIVE SEED LOCALIZATION Left 07/17/2019   Procedure: LEFT BREAST LUMPECTOMY WITH RADIOACTIVE SEED LOCALIZATION;  Surgeon: Johnathan Hausen, MD;  Location: [place];  Service: General;  Laterality: Left;   BREAST SURGERY     Right mastectomy [place] Right 09/30/2014   Procedure: RIGHT CARPAL TUNNEL RELEASE;  Surgeon: Daryll Brod, MD;  Location: [place];  Service: Orthopedics;  Laterality: Right;   COLONOSCOPY     EYE SURGERY     cataracts   JOINT REPLACEMENT     lt total knee 2011   LIPOMA EXCISION  11/28/2011   Procedure: EXCISION LIPOMA;  Surgeon: Pedro Earls, MD;  Location: [place];  Service: General;  Laterality: N/A;  excisino lipoma 4 cm back of neck   MASTECTOMY Right 1999   TOTAL MASTECTOMY Left 05/10/2021   Procedure: LEFT TOTAL MASTECTOMY;  Surgeon: Johnathan Hausen, MD;  Location: [facility];  Service: General;  Laterality: Left;     A IV Location/Drains/Wounds Patient Lines/Drains/Airways Status     Active Line/Drains/Airways  Name Placement date Placement time Site Days   Peripheral IV 03/09/23 20 G 1" Left Antecubital 03/09/23  1230  Antecubital  less than 1            Intake/Output Last 24 hours No intake or output data in the 24 hours ending 03/09/23 1639  Labs/Imaging Results for orders placed or performed during the hospital encounter of 03/09/23 (from the past 48 hour(s))  Resp panel by RT-PCR (RSV, Flu A&B, Covid) Anterior Nasal Swab     Status: None   Collection Time:  03/09/23 11:34 AM   Specimen: Anterior Nasal Swab  Result Value Ref Range   SARS Coronavirus 2 by RT PCR NEGATIVE NEGATIVE   Influenza A by PCR NEGATIVE NEGATIVE   Influenza B by PCR NEGATIVE NEGATIVE    Comment: (NOTE) The Xpert Xpress SARS-CoV-2/FLU/RSV plus assay is intended as an aid in the diagnosis of influenza from Nasopharyngeal swab specimens and should not be used as a sole basis for treatment. Nasal washings and aspirates are unacceptable for Xpert Xpress SARS-CoV-2/FLU/RSV testing.  Fact Sheet for Patients: EntrepreneurPulse.com.au  Fact Sheet for Healthcare Providers: IncredibleEmployment.be  This test is not yet approved or cleared by the [place] FDA and has been authorized for detection and/or diagnosis of SARS-CoV-2 by FDA under an Emergency Use Authorization (EUA). This EUA will remain in effect (meaning this test can be used) for the duration of the COVID-19 declaration under Section 564(b)(1) of the Act, 21 U.S.C. section 360bbb-3(b)(1), unless the authorization is terminated or revoked.     Resp Syncytial Virus by PCR NEGATIVE NEGATIVE    Comment: (NOTE) Fact Sheet for Patients: EntrepreneurPulse.com.au  Fact Sheet for Healthcare Providers: IncredibleEmployment.be  This test is not yet approved or cleared by the [place] FDA and has been authorized for detection and/or diagnosis of SARS-CoV-2 by FDA under an Emergency Use Authorization (EUA). This EUA will remain in effect (meaning this test can be used) for the duration of the COVID-19 declaration under Section 564(b)(1) of the Act, 21 U.S.C. section 360bbb-3(b)(1), unless the authorization is terminated or revoked.  Performed at [facility], [place] [address] [place], [place] [address]   CBC with Differential     Status: Abnormal   Collection Time: 03/09/23 12:04 PM  Result Value Ref Range   WBC 12.3 (H) 4.0 -  10.5 K/uL   RBC 4.77 3.87 - 5.11 MIL/uL   Hemoglobin 14.0 12.0 - 15.0 g/dL   HCT 42.9 36.0 - 46.0 %   MCV 89.9 80.0 - 100.0 fL   MCH 29.4 26.0 - 34.0 pg   MCHC 32.6 30.0 - 36.0 g/dL   RDW 17.3 (H) 11.5 - 15.5 %   Platelets 149 (L) 150 - 400 K/uL    Comment: REPEATED TO VERIFY   nRBC 0.0 0.0 - 0.2 %   Neutrophils Relative % 92 %   Neutro Abs 11.2 (H) 1.7 - 7.7 K/uL   Lymphocytes Relative 3 %   Lymphs Abs 0.4 (L) 0.7 - 4.0 K/uL   Monocytes Relative 4 %   Monocytes Absolute 0.5 0.1 - 1.0 K/uL   Eosinophils Relative 0 %   Eosinophils Absolute 0.0 0.0 - 0.5 K/uL   Basophils Relative 0 %   Basophils Absolute 0.0 0.0 - 0.1 K/uL   Immature Granulocytes 1 %   Abs Immature Granulocytes 0.13 (H) 0.00 - 0.07 K/uL    Comment: Performed at [facility] [address], [place],  [address]  Troponin I (High Sensitivity)     Status: Abnormal   Collection Time: 03/09/23 12:04 PM  Result Value Ref Range   Troponin I (High Sensitivity) 72 (H) <18 ng/L    Comment: (NOTE) Elevated high sensitivity troponin I (hsTnI) values and significant  changes across serial measurements may suggest ACS but many other  chronic and acute conditions are known to elevate hsTnI results.  Refer to the Links section for chest pain algorithms and additional  guidance. Performed at [facility], [place] [address], [place], [place] [address]   Brain natriuretic peptide     Status: Abnormal   Collection Time: 03/09/23 12:04 PM  Result Value Ref Range   B Natriuretic Peptide 660.9 (H) 0.0 - 100.0 pg/mL    Comment: Performed at [facility] [address], [place], [place] [address]  I-Stat venous blood gas, ED     Status: Abnormal   Collection Time: 03/09/23 12:14 PM  Result Value Ref Range   pH, Ven 7.511 (H) 7.25 - 7.43   pCO2, Ven 27.3 (L) 44 - 60 mmHg   pO2, Ven 157 (H) 32 - 45 mmHg   Bicarbonate 21.9 20.0 - 28.0 mmol/L   TCO2 23 22 - 32 mmol/L   O2 Saturation 100 %   Acid-Base Excess  0.0 0.0 - 2.0 mmol/L   Sodium 127 (L) 135 - 145 mmol/L   Potassium 4.3 3.5 - 5.1 mmol/L   Calcium, Ion 1.06 (L) 1.15 - 1.40 mmol/L   HCT 42.0 36.0 - 46.0 %   Hemoglobin 14.3 12.0 - 15.0 g/dL   Sample type VENOUS   Troponin I (High Sensitivity)     Status: Abnormal   Collection Time: 03/09/23  2:02 PM  Result Value Ref Range   Troponin I (High Sensitivity) 51 (H) <18 ng/L    Comment: DELTA CHECK NOTED (NOTE) Elevated high sensitivity troponin I (hsTnI) values and significant  changes across serial measurements may suggest ACS but many other  chronic and acute conditions are known to elevate hsTnI results.  Refer to the "Links" section for chest pain algorithms and additional  guidance. Performed at [facility], [place] [address], [place], [place] [address]   Comprehensive metabolic panel     Status: Abnormal   Collection Time: 03/09/23  2:02 PM  Result Value Ref Range   Sodium 125 (L) 135 - 145 mmol/L   Potassium 4.3 3.5 - 5.1 mmol/L    Comment: HEMOLYSIS AT THIS LEVEL MAY AFFECT RESULT   Chloride 95 (L) 98 - 111 mmol/L   CO2 23 22 - 32 mmol/L   Glucose, Bld 254 (H) 70 - 99 mg/dL    Comment: Glucose reference range applies only to samples taken after fasting for at least 8 hours.   BUN 26 (H) 8 - 23 mg/dL   Creatinine, Ser 0.89 0.44 - 1.00 mg/dL   Calcium 8.9 8.9 - 10.3 mg/dL   Total Protein 4.7 (L) 6.5 - 8.1 g/dL   Albumin 2.4 (L) 3.5 - 5.0 g/dL   AST 34 15 - 41 U/L    Comment: HEMOLYSIS AT THIS LEVEL MAY AFFECT RESULT   ALT 27 0 - 44 U/L    Comment: HEMOLYSIS AT THIS LEVEL MAY AFFECT RESULT   Alkaline Phosphatase 50 38 - 126 U/L   Total Bilirubin 1.3 (H) 0.3 - 1.2 mg/dL    Comment: HEMOLYSIS AT THIS LEVEL MAY AFFECT RESULT   GFR, Estimated >60 >60 mL/min  Comment: (NOTE) Calculated using the CKD-EPI Creatinine Equation (2021)    Anion gap 7 5 - 15    Comment: Performed at [facility], [place] [address], [place], [place] [address]   DG Chest Port 1  View  Result Date: 03/09/2023 CLINICAL DATA:  Cough EXAM: PORTABLE CHEST 1 VIEW COMPARISON:  03/06/2023 FINDINGS: Cardiac silhouette is prominent. Aorta is calcified. There is left base consolidation or volume loss. No pneumothorax. IMPRESSION: Small effusion.  Prominent cardiac silhouette. Electronically Signed   By: Sammie Bench M.D.   On: 03/09/2023 13:47    Pending Labs Unresulted Labs (From admission, onward)     Start     Ordered   03/09/23 1300  Protime-INR  Once,   STAT        03/09/23 1300   03/09/23 1135  Urinalysis, Routine w reflex microscopic -Urine, Clean Catch  Once,   URGENT       Question:  Specimen Source  Answer:  Urine, Clean Catch   03/09/23 1134            Vitals/Pain Today's Vitals   03/09/23 1522 03/09/23 1545 03/09/23 1555 03/09/23 1600  BP:  (!) 96/52 (!) 92/56 94/61  Pulse:   (!) 105 (!) 101  Resp: 19 18 19 17   Temp:      SpO2:   93% 93%  Weight:      Height:      PainSc:        Isolation Precautions No active isolations  Medications Medications  diltiazem (CARDIZEM) 1 mg/mL load via infusion 15 mg (15 mg Intravenous Bolus from Bag 03/09/23 1315)    And  diltiazem (CARDIZEM) 125 mg in dextrose 5% 125 mL (1 mg/mL) infusion (5 mg/hr Intravenous Rate/Dose Change 03/09/23 1638)  insulin aspart (novoLOG) injection 0-9 Units (has no administration in time range)  furosemide (LASIX) injection 40 mg (40 mg Intravenous Given 03/09/23 1310)    Mobility walks with device     Focused Assessments Cardiac Assessment Handoff:    No results found for: "CKTOTAL", "CKMB", "CKMBINDEX", "TROPONINI" No results found for: "DDIMER" Does the Patient currently have chest pain? No    R Recommendations: See Admitting Provider Note  Report given to:   Additional Notes:

## 2023-03-09 NOTE — Progress Notes (Signed)
Patient arrived onto floor at 1830. Patient is A&Ox3. Room Air. Skin assessment performed with Alda Lea, RN.  Patient settled. Family at bedside. Vitals are stable, but BP on the lower side, yet patient is asymptomatic. CBG checked. Insulin administered. No current c/o SOB, and/or chest pain. Nor current questions or concerns. Will pass on report to night shift nurse

## 2023-03-09 NOTE — Assessment & Plan Note (Deleted)
She has no chest pain or acute changes on EKG besides afib with RVR Likely demand ischemia in setting of afib with RVR Down trending Continue tele

## 2023-03-09 NOTE — H&P (Signed)
History and Physical    Patient: Amanda [facility] [id number] DOB: [birth year] DOA: 03/09/2023 DOS: the patient was seen and examined on 03/09/2023 PCP: Nicholas Lose, MD  Patient coming from: Home - lives with her daughters, they alternate. Uses walker to ambulate.     Chief Complaint: shortness of breath, leg swelling   HPI: Amanda Green is a 88 y.o. female with medical history significant of breast cancer s/p right mastectomy in 199 and left lumpectomy in 2022 followed by left mastectomy in 2022 for DCIS and hx of mets to brain, T2DM, HTN, HLD, hx of CVA, memory loss, cardiac amyloid on tafamidis who presented to ED with complaints of shortness of breath and leg swelling.   Seen at [facility] ED on 3/20 for leg swelling up to her belly and work up reassuring with normal BNP.  They went home and swelling just got worse.  She was unable to get out of chair and one of her daughters noticed she was not breathing right.  She has had a dry cough that started last week.  She denies any palpitations or chest pain. She has had increased shortness of breath with exertion and needed more breaks with walking. No orthopnea. Deny any sick contacts. Has had diarrhea since radiation treatment and steroids. She has been more tired since she started the radiation, but this was completed 03/06/23.    Denies any fever/chills, vision changes/headaches, chest pain or palpitations,  abdominal pain, N/V, dysuria.    She does not smoke or drink alcohol.   ER Course:  vitals: afebrile, bp: 121/83, HR: 109, RR: 18, oxygen: 93% Pertinent labs: WBC: 12.3, sodium: 125, glucose: 254, BUN: 26, troponin 72>51, BNP: 660, EKG showed afib with RVR.  CXR: small effusion, prominent cardiac silhouette  In ED: given 40mg  of lasix and started on cardizem gtt. [facility] asked to admit.   Review of Systems: As mentioned in the history of present illness. All other systems reviewed and are negative. Past Medical History:  Diagnosis  Date   Arthritis    knees,   Breast cancer ([place]) 1999   right mastectomy   Breast cancer, left ([place]) 05/2019   left lumpectomy   Diabetes mellitus    Dislocation of left shoulder joint    Family history of prostate cancer    Hyperlipidemia    Hypertension    Memory loss    Numbness and tingling of right lower extremity    Paresthesia    Stroke ([place])    light stroke - 2012 ,right leg nerve damage    Past Surgical History:  Procedure Laterality Date   ABDOMINAL HYSTERECTOMY     APPENDECTOMY     BREAST BIOPSY Left 01/19/2010   BREAST LUMPECTOMY WITH RADIOACTIVE SEED LOCALIZATION Left 07/17/2019   Procedure: LEFT BREAST LUMPECTOMY WITH RADIOACTIVE SEED LOCALIZATION;  Surgeon: Johnathan Hausen, MD;  Location: [place];  Service: General;  Laterality: Left;   BREAST SURGERY     Right mastectomy [place] Right 09/30/2014   Procedure: RIGHT CARPAL TUNNEL RELEASE;  Surgeon: Daryll Brod, MD;  Location: [place];  Service: Orthopedics;  Laterality: Right;   COLONOSCOPY     EYE SURGERY     cataracts   JOINT REPLACEMENT     lt total knee 2011   LIPOMA EXCISION  11/28/2011   Procedure: EXCISION LIPOMA;  Surgeon: Pedro Earls, MD;  Location: [place];  Service: General;  Laterality: N/A;  excisino lipoma 4 cm back of neck   MASTECTOMY Right 1999   TOTAL MASTECTOMY Left 05/10/2021   Procedure: LEFT TOTAL MASTECTOMY;  Surgeon: Johnathan Hausen, MD;  Location: [facility];  Service: General;  Laterality: Left;   Social History:  reports that she has never smoked. She has never used smokeless tobacco. She reports that she does not drink alcohol and does not use drugs.  Allergies  Allergen Reactions   Gabapentin     Drowsiness    Family History  Problem Relation Age of Onset   Esophageal cancer Mother        smoker   Other Father        Unsure of medical history.   Prostate cancer Brother        d. 52s   Cancer Son         ?? unsure, but may have had cancer   Cancer Son        ?? unsure, but may have had cancer    Prior to Admission medications   Medication Sig Start Date End Date Taking? Authorizing Provider  apixaban (ELIQUIS) 5 MG TABS tablet Take 5 mg by mouth 2 (two) times daily. 06/23/21   [provider]  atorvastatin (LIPITOR) 80 MG tablet Take 80 mg by mouth daily.    [provider]  brimonidine-timolol (COMBIGAN) 0.2-0.5 % ophthalmic solution Place 1 drop into the left eye every 12 (twelve) hours.    [provider]  calcium carbonate (OSCAL) 1500 (600 Ca) MG TABS tablet Take 1,500 mg by mouth daily with breakfast.    [provider]  dexamethasone (DECADRON) 4 MG tablet Take 1 tablet (4 mg total) by mouth every 12 (twelve) hours. 02/18/23 03/20/23  [place] Donnamarie Poag, DO  diltiazem (TIAZAC) 180 MG 24 hr capsule Take 180 mg by mouth daily. 02/13/23   [provider]  dorzolamide (TRUSOPT) 2 % ophthalmic solution Place 1 drop into the left eye 2 (two) times daily. 10/19/20   [provider]  Ferrous Sulfate (IRON PO) Take 1 tablet by mouth daily.    [provider]  hydrochlorothiazide (HYDRODIURIL) 12.5 MG tablet Take 12.5 mg by mouth daily. 01/24/23   [provider]  magnesium 30 MG tablet Take 30 mg by mouth daily.    [provider]  memantine (NAMENDA) 10 MG tablet Take 1 tablet (10 mg total) by mouth 2 (two) times daily. 02/04/23   Frann Rider, NP  metFORMIN (GLUCOPHAGE) 500 MG tablet Take 500 mg by mouth 2 (two) times daily with a meal. 08/18/11   [provider]  ofloxacin (OCUFLOX) 0.3 % ophthalmic solution Place 1 drop into the right eye 4 (four) times daily. 02/06/23   [provider]  prednisoLONE acetate (PRED FORTE) 1 % ophthalmic suspension Place 1 drop into the right eye 4 (four) times daily. 07/18/22   [provider]  pregabalin (LYRICA) 75 MG capsule Take 75 mg by mouth 3 (three) times  daily. 02/13/23   [provider]  ROCKLATAN 0.02-0.005 % SOLN Place 1 drop into the left eye at bedtime. 01/31/23   [provider]  Tafamidis (VYNDAMAX) 61 MG CAPS Take 61 mg by mouth daily. 04/27/22   Croitoru, Mihai, MD  vitamin B-12 (CYANOCOBALAMIN) 1000 MCG tablet Take 1,000 mcg by mouth daily.    [provider]  vitamin C (ASCORBIC ACID) 500 MG tablet Take 500 mg by mouth daily.    [provider]    Physical  Exam: Vitals:   03/09/23 1600 03/09/23 1630 03/09/23 1709 03/09/23 1712  BP: 94/61 (!) 96/56 (!) 79/60 (!) 91/53  Pulse: (!) 101 84 74 (!) 39  Resp: 17 18 13 20   Temp:      SpO2: 93%  98% 95%  Weight:      Height:       General:  Appears calm and comfortable and is in NAD. Sleepy but alert and oriented.  Eyes:  PERRL, EOMI, normal lids, iris ENT:  grossly normal hearing, lips & tongue, mmm; appropriate dentition Neck:  no LAD, masses or thyromegaly; no carotid bruits, +JVD Cardiovascular:  Regularly irregular, no m/r/g. Trace LE edema  Respiratory:   CTA bilaterally with no wheezes/rales/rhonchi.  Normal respiratory effort. Abdomen:  soft, NT, ND, NABS Back:   normal alignment, no CVAT Skin:  no rash or induration seen on limited exam Musculoskeletal:  grossly normal tone BUE/BLE, good ROM, no bony abnormality Lower extremity:  Limited foot exam with no ulcerations.  2+ distal pulses. Psychiatric:  grossly normal mood and affect, speech fluent and appropriate, AOx3 Neurologic:  CN 2-12 grossly intact, moves all extremities in coordinated fashion, sensation intact   Radiological Exams on Admission: Independently reviewed - see discussion in A/P where applicable  DG Chest Port 1 View  Result Date: 03/09/2023 CLINICAL DATA:  Cough EXAM: PORTABLE CHEST 1 VIEW COMPARISON:  03/06/2023 FINDINGS: Cardiac silhouette is prominent. Aorta is calcified. There is left base consolidation or volume loss. No pneumothorax. IMPRESSION: Small effusion.   Prominent cardiac silhouette. Electronically Signed   By: Sammie Bench M.D.   On: 03/09/2023 13:47    EKG: Independently reviewed.  Atrial fib  with rate 131; nonspecific ST changes with no evidence of acute ischemia   Labs on Admission: I have personally reviewed the available labs and imaging studies at the time of the admission.  Pertinent labs:   WBC: 12.3, sodium: 125,  glucose: 254,  BUN: 26,  troponin 72>51,  BNP: 660,   Assessment and Plan: Principal Problem:   Atrial fibrillation with RVR (HCC) Active Problems:   Acute on chronic diastolic CHF (congestive heart failure) (HCC)   Hyponatremia   Elevated troponin   Ductal carcinoma in situ (DCIS) of left breast   Cardiac amyloidosis (HCC)   Controlled type 2 diabetes mellitus without complication, without long-term current use of insulin (HCC)   Essential hypertension   Cognitive impairment   Leukocytosis    Assessment and Plan: * Atrial fibrillation with RVR ([place]) 88 year old with history of atrial fibrillation who presented to ED with complaints of increased swelling and shortness of breat x1 week found to be in atrial fibrillation with RVR  -admit to progressive -started on cardizem gtt with good rate control; however, blood pressures soft to low with MAP of 62 so stopped gtt -heart rate maintaining at 70-80s -will do metoprolol q8 hours PRN with parameters to hold if bp soft -start back oral cardizem tomorrow as she already took this today, may need dose increased -continue eliquis, has not missed any doses  -echo repeat due to CHF exacerbation   Acute on chronic diastolic CHF (congestive heart failure) ([place]) -She has had worsening LE swelling, fatigue, dyspnea on exertion and elevated BNP to 660 which was normal 3 days ago with physical exam + for JVD supports acute on chronic diastolic CHF -likely exacerbated by afib with RVR -not on any diuretics at home -continue lasix 40mg  daily and watch I/O -strict  I/O and  daily weights -echo in 03/2021: normal EF with severe left ventriclar wall thickening. Grade 1 DD.  -repeat echo pending   Hyponatremia Secondary to hypervolemia hyponatremia vs. Cancer  Corrects to 129 for hyperglycemia Continue to diurese, control sugars Trend    Elevated troponin She has no chest pain or acute changes on EKG besides afib with RVR Likely demand ischemia in setting of afib with RVR Down trending Continue tele   Ductal carcinoma in situ (DCIS) of left breast History of breast cancer s/p right matectomy in 1999  -left lumpectomy followed by left mastectomy in 2022 -presented to Ed at beginning of 02/2023 -Imaging notable for left enhancing breast mass measuring 3.8 x 2.7 cm, left axillary LAD and left occipital lobe enhancing mass measuring 3.2 x 3.2 x 2.8 cm with 5 mm left-to-right shift..  Now concern for recurrence of breast cancer with metastasis to the brain. Interventional radiology was consulted and patient underwent left axillary mass biopsy on 02/15/2023.   -She is now undergoing stereotactic radiation by Dr. Lisbeth Renshaw, has finished treatment on 03/06/23 -on decadron taper. Now on 2mg  BID until 03/14/22 then daily   Cardiac amyloidosis ([place]) TTE 2022 with LVEF greater than [id number], grade 1 diastolic dysfunction, severe LVH. Continue tafamidis 61 mg p.o. daily    Controlled type 2 diabetes mellitus without complication, without long-term current use of insulin (HCC) A1C 6.5 in 01/2023 Hold metformin  SSI and accuchecks QAC/HS Blood sugar elevated and if on steroids may need long acting insulin   Essential hypertension On cardizem drip; however, bp dropping and MAP 62 Stop ggt for now Hold oral HTN drug with parameters, but plan on re starting her oral cardizem tomorrow if bp allows Holding home hctz   Cognitive impairment Continue namenda Bid  Delerium precautions   Leukocytosis Secondary to steroids No s/sx of infectious etiology     Advance Care  Planning:   Code Status: Full Code   Consults: none   DVT Prophylaxis: eliquis   Family Communication: daughter at bedside.   Severity of Illness: The appropriate patient status for this patient is INPATIENT. Inpatient status is judged to be reasonable and necessary in order to provide the required intensity of service to ensure the patient's safety. The patient's presenting symptoms, physical exam findings, and initial radiographic and laboratory data in the context of their chronic comorbidities is felt to place them at high risk for further clinical deterioration. Furthermore, it is not anticipated that the patient will be medically stable for discharge from the hospital within 2 midnights of admission.   * I certify that at the point of admission it is my clinical judgment that the patient will require inpatient hospital care spanning beyond 2 midnights from the point of admission due to high intensity of service, high risk for further deterioration and high frequency of surveillance required.*  Author: Orma Flaming, MD 03/09/2023 5:42 PM  For on call review [email or web address].

## 2023-03-09 NOTE — ED Triage Notes (Signed)
C/o swelling to lower ext. Onset wed states she was seen at [facility] on Wed and discharged home states she had appointment with PCP and April 5th. States her legs are still swollen thsi am, however they aren't as swollen as before. Patient denies pain

## 2023-03-09 NOTE — Assessment & Plan Note (Signed)
Continue namenda Bid  Delerium precautions

## 2023-03-09 NOTE — Assessment & Plan Note (Deleted)
88 year old with history of atrial fibrillation who presented to ED with complaints of increased swelling and shortness of breat x1 week found to be in atrial fibrillation with RVR  -admit to progressive -started on cardizem gtt with good rate control; however, blood pressures soft to low with MAP of 62 so stopped gtt -heart rate maintaining at 70-80s -will do metoprolol q8 hours PRN with parameters to hold if bp soft -start back oral cardizem tomorrow as she already took this today, may need dose increased -continue eliquis, has not missed any doses  -echo repeat due to CHF exacerbation

## 2023-03-09 NOTE — Assessment & Plan Note (Signed)
Secondary to steroids No s/sx of infectious etiology

## 2023-03-09 NOTE — ED Notes (Signed)
Admitting MD aware of BP 

## 2023-03-09 NOTE — Assessment & Plan Note (Addendum)
Blood pressure stable, continue with spironolactone, metoprolol, and losartan.

## 2023-03-10 DIAGNOSIS — I4891 Unspecified atrial fibrillation: Secondary | ICD-10-CM | POA: Diagnosis not present

## 2023-03-10 LAB — URINALYSIS, ROUTINE W REFLEX MICROSCOPIC
Bilirubin Urine: NEGATIVE
Glucose, UA: NEGATIVE mg/dL
Ketones, ur: NEGATIVE mg/dL
Leukocytes,Ua: NEGATIVE
Nitrite: NEGATIVE
Protein, ur: NEGATIVE mg/dL
Specific Gravity, Urine: 1.031 — ABNORMAL HIGH (ref 1.005–1.030)
pH: 7 (ref 5.0–8.0)

## 2023-03-10 LAB — CBC
HCT: 38.3 % (ref 36.0–46.0)
Hemoglobin: 13.2 g/dL (ref 12.0–15.0)
MCH: 29.1 pg (ref 26.0–34.0)
MCHC: 34.5 g/dL (ref 30.0–36.0)
MCV: 84.5 fL (ref 80.0–100.0)
Platelets: 160 10*3/uL (ref 150–400)
RBC: 4.53 MIL/uL (ref 3.87–5.11)
RDW: 16.9 % — ABNORMAL HIGH (ref 11.5–15.5)
WBC: 10.8 10*3/uL — ABNORMAL HIGH (ref 4.0–10.5)
nRBC: 0 % (ref 0.0–0.2)

## 2023-03-10 LAB — BASIC METABOLIC PANEL
Anion gap: 7 (ref 5–15)
BUN: 32 mg/dL — ABNORMAL HIGH (ref 8–23)
CO2: 22 mmol/L (ref 22–32)
Calcium: 9.1 mg/dL (ref 8.9–10.3)
Chloride: 98 mmol/L (ref 98–111)
Creatinine, Ser: 0.9 mg/dL (ref 0.44–1.00)
GFR, Estimated: >60 mL/min (ref >60)
Glucose, Bld: 241 mg/dL — ABNORMAL HIGH (ref 70–99)
Potassium: 3.9 mmol/L (ref 3.5–5.1)
Sodium: 127 mmol/L — ABNORMAL LOW (ref 135–145)

## 2023-03-10 LAB — GLUCOSE, CAPILLARY
Glucose-Capillary: 133 mg/dL — ABNORMAL HIGH (ref 70–99)
Glucose-Capillary: 185 mg/dL — ABNORMAL HIGH (ref 70–99)
Glucose-Capillary: 252 mg/dL — ABNORMAL HIGH (ref 70–99)
Glucose-Capillary: 267 mg/dL — ABNORMAL HIGH (ref 70–99)
Glucose-Capillary: 308 mg/dL — ABNORMAL HIGH (ref 70–99)

## 2023-03-10 MED ORDER — INSULIN GLARGINE-YFGN 100 UNIT/ML ~~LOC~~ SOLN
10.0000 [IU] | Freq: Every day | SUBCUTANEOUS | Status: DC
  Administered 2023-03-10 – 2023-03-12 (×3): 10 [IU] via SUBCUTANEOUS
  Filled 2023-03-10 (×3): qty 0.1

## 2023-03-10 MED ORDER — DIGOXIN 125 MCG PO TABS
0.1250 mg | ORAL_TABLET | Freq: Every day | ORAL | Status: DC
  Administered 2023-03-10 – 2023-03-11 (×2): 0.125 mg via ORAL
  Filled 2023-03-10 (×2): qty 1

## 2023-03-10 NOTE — Plan of Care (Signed)
  Problem: Education: Goal: Ability to describe self-care measures that may prevent or decrease complications (Diabetes Survival Skills Education) will improve Outcome: Progressing Goal: Individualized Educational Video(s) Outcome: Progressing   Problem: Coping: Goal: Ability to adjust to condition or change in health will improve Outcome: Progressing   Problem: Fluid Volume: Goal: Ability to maintain a balanced intake and output will improve Outcome: Progressing   Problem: Health Behavior/Discharge Planning: Goal: Ability to identify and utilize available resources and services will improve Outcome: Progressing Goal: Ability to manage health-related needs will improve Outcome: Progressing   Problem: Metabolic: Goal: Ability to maintain appropriate glucose levels will improve Outcome: Progressing   Problem: Nutritional: Goal: Maintenance of adequate nutrition will improve Outcome: Progressing Goal: Progress toward achieving an optimal weight will improve Outcome: Progressing   Problem: Skin Integrity: Goal: Risk for impaired skin integrity will decrease Outcome: Progressing   Problem: Tissue Perfusion: Goal: Adequacy of tissue perfusion will improve Outcome: Progressing   Problem: Education: Goal: Knowledge of disease or condition will improve Outcome: Progressing Goal: Understanding of medication regimen will improve Outcome: Progressing Goal: Individualized Educational Video(s) Outcome: Progressing   Problem: Activity: Goal: Ability to tolerate increased activity will improve Outcome: Progressing   Problem: Cardiac: Goal: Ability to achieve and maintain adequate cardiopulmonary perfusion will improve Outcome: Progressing   Problem: Health Behavior/Discharge Planning: Goal: Ability to safely manage health-related needs after discharge will improve Outcome: Progressing   Problem: Education: Goal: Knowledge of General Education information will  improve Description: Including pain rating scale, medication(s)/side effects and non-pharmacologic comfort measures Outcome: Progressing   Problem: Health Behavior/Discharge Planning: Goal: Ability to manage health-related needs will improve Outcome: Progressing   Problem: Clinical Measurements: Goal: Ability to maintain clinical measurements within normal limits will improve Outcome: Progressing Goal: Will remain free from infection Outcome: Progressing Goal: Diagnostic test results will improve Outcome: Progressing Goal: Respiratory complications will improve Outcome: Progressing Goal: Cardiovascular complication will be avoided Outcome: Progressing   Problem: Activity: Goal: Risk for activity intolerance will decrease Outcome: Progressing   Problem: Nutrition: Goal: Adequate nutrition will be maintained Outcome: Progressing   Problem: Coping: Goal: Level of anxiety will decrease Outcome: Progressing   Problem: Elimination: Goal: Will not experience complications related to bowel motility Outcome: Progressing Goal: Will not experience complications related to urinary retention Outcome: Progressing   Problem: Pain Managment: Goal: General experience of comfort will improve Outcome: Progressing   Problem: Safety: Goal: Ability to remain free from injury will improve Outcome: Progressing   Problem: Skin Integrity: Goal: Risk for impaired skin integrity will decrease Outcome: Progressing   

## 2023-03-10 NOTE — Consult Note (Signed)
Cardiology Consultation   Patient ID: Amanda Green MRN: [id number]; DOB: 11/28/1935  Admit date: 03/09/2023 Date of Consult: 03/10/2023  PCP:  Amanda Lose, MD   [place] Providers Cardiologist:  Amanda Klein, MD      Patient Profile:   Amanda Green is a 88 y.o. female with a hx of PAF, cardiac amyloidosis on tafamidis,  HTN, HLD, DM2, CVA, memory loss, and breast cancer s/p right mastectomy 1999 and left mastectomy 2022 with ?brain mets who is being seen 03/10/2023 for the evaluation of Afib with RVR at the request of Amanda Green.  History of Present Illness:   Amanda Green was diagnosed with Afib RVR during a hospitalization 03/2021. She was generally asymptomatic and converted on cardizem gtt. She was anticoagulated with eliquis. Echo at that time showed LVEF > 75% with severe LVH, G1DD, elevated LVEDP, mild LAE, and no significant valvular abnormalities. Of note, there was concerning features for amyloidosis on her echo. She subsequently underwent SPECT which was positive for amyloidosis, suspected transthyretin amyloidosis.  She was last seen by Amanda Green 01/03/22 to discuss these results and to move forward with treatment with vyndamax if myeloma panel negative. She was subsequently stated on vyndamax with patient assistance.   Recent imaging Feb-March 2024 notable for left enhancing breast mass and left occipital lobe enhancing mass with left to righ shift of 5 mm concerning for recurrence of breast cancer now with brain mets.    She presented to [facility] 03/06/23 with leg swelling. She was discharged without admission given normal BNP. After returning home, her breathing worsened and she came back to [facility] found to be in Afib with RVR. She was started on cardizem gtt and admitted to hospital medicine service. Unfortunately, her BP was soft and cardizem was discontinued. She has been placed on her home cardizem PO 180 mg with PRN 12.5 mg lopressor. Does not look like  she has received PRN lopressor. Telemetry with rates in the 90-130s, currently in the 120s.   Pt found sleeping nearly flat. She has no complaints, but dementia. No family at bedside. She denies pain. She has not received IV lasix yet.    Past Medical History:  Diagnosis Date   Arthritis    knees,   Breast cancer ([place]) 1999   right mastectomy   Breast cancer, left ([place]) 05/2019   left lumpectomy   Diabetes mellitus    Dislocation of left shoulder joint    Family history of prostate cancer    Hyperlipidemia    Hypertension    Memory loss    Numbness and tingling of right lower extremity    Paresthesia    Stroke ([place])    light stroke - 2012 ,right leg nerve damage     Past Surgical History:  Procedure Laterality Date   ABDOMINAL HYSTERECTOMY     APPENDECTOMY     BREAST BIOPSY Left 01/19/2010   BREAST LUMPECTOMY WITH RADIOACTIVE SEED LOCALIZATION Left 07/17/2019   Procedure: LEFT BREAST LUMPECTOMY WITH RADIOACTIVE SEED LOCALIZATION;  Surgeon: Johnathan Hausen, MD;  Location: [place];  Service: General;  Laterality: Left;   BREAST SURGERY     Right mastectomy [place] Right 09/30/2014   Procedure: RIGHT CARPAL TUNNEL RELEASE;  Surgeon: Daryll Brod, MD;  Location: [place];  Service: Orthopedics;  Laterality: Right;   COLONOSCOPY     EYE SURGERY     cataracts   JOINT REPLACEMENT  lt total knee 2011   LIPOMA EXCISION  11/28/2011   Procedure: EXCISION LIPOMA;  Surgeon: Pedro Earls, MD;  Location: [place];  Service: General;  Laterality: N/A;  excisino lipoma 4 cm back of neck   MASTECTOMY Right 1999   TOTAL MASTECTOMY Left 05/10/2021   Procedure: LEFT TOTAL MASTECTOMY;  Surgeon: Johnathan Hausen, MD;  Location: [facility];  Service: General;  Laterality: Left;     Home Medications:  Prior to Admission medications   Medication Sig Start Date End Date Taking? Authorizing Provider  apixaban (ELIQUIS) 5 MG  TABS tablet Take 5 mg by mouth 2 (two) times daily. 06/23/21  Yes [provider]  atorvastatin (LIPITOR) 80 MG tablet Take 80 mg by mouth daily.   Yes [provider]  brimonidine-timolol (COMBIGAN) 0.2-0.5 % ophthalmic solution Place 1 drop into the left eye every 12 (twelve) hours.   Yes [provider]  calcium carbonate (OSCAL) 1500 (600 Ca) MG TABS tablet Take 1,500 mg by mouth daily with breakfast.   Yes [provider]  dexamethasone (DECADRON) 4 MG tablet Take 1 tablet (4 mg total) by mouth every 12 (twelve) hours. Patient taking differently: Take 2 mg by mouth every 12 (twelve) hours. 02/18/23 03/20/23 Yes [place] Eric J, DO  diltiazem (TIAZAC) 180 MG 24 hr capsule Take 180 mg by mouth daily. 02/13/23  Yes [provider]  dorzolamide (TRUSOPT) 2 % ophthalmic solution Place 1 drop into the left eye 2 (two) times daily. 10/19/20  Yes [provider]  Ferrous Sulfate (IRON PO) Take 1 tablet by mouth daily.   Yes [provider]  hydrochlorothiazide (HYDRODIURIL) 12.5 MG tablet Take 12.5 mg by mouth daily. 01/24/23  Yes [provider]  LANTUS SOLOSTAR 100 UNIT/ML Solostar Pen Inject 10 mLs into the skin daily. 02/20/23  Yes [provider]  lisinopril (ZESTRIL) 5 MG tablet Take 5 mg by mouth daily.   Yes [provider]  magnesium 30 MG tablet Take 30 mg by mouth daily.   Yes [provider]  memantine (NAMENDA) 10 MG tablet Take 1 tablet (10 mg total) by mouth 2 (two) times daily. 02/04/23  Yes Frann Rider, NP  metFORMIN (GLUCOPHAGE) 500 MG tablet Take 500 mg by mouth 2 (two) times daily with a meal. 08/18/11  Yes [provider]  ofloxacin (OCUFLOX) 0.3 % ophthalmic solution Place 1 drop into the right eye 4 (four) times daily. 02/06/23  Yes [provider]  prednisoLONE acetate (PRED FORTE) 1 % ophthalmic suspension Place 1 drop into the right eye 4 (four) times daily. 07/18/22  Yes  [provider]  pregabalin (LYRICA) 75 MG capsule Take 75 mg by mouth 3 (three) times daily. 02/13/23  Yes [provider]  ROCKLATAN 0.02-0.005 % SOLN Place 1 drop into the left eye at bedtime. 01/31/23  Yes [provider]  Tafamidis (VYNDAMAX) 61 MG CAPS Take 61 mg by mouth daily. 04/27/22  Yes Croitoru, Mihai, MD  vitamin B-12 (CYANOCOBALAMIN) 1000 MCG tablet Take 1,000 mcg by mouth daily.   Yes [provider]  vitamin C (ASCORBIC ACID) 500 MG tablet Take 500 mg by mouth daily.   Yes [provider]    Inpatient Medications: Scheduled Meds:  apixaban  5 mg Oral BID   atorvastatin  80 mg Oral Daily   brimonidine  1 drop Left Eye BID   And   timolol  1 drop Left Eye BID   dexamethasone  2  mg Oral Q12H   diltiazem  180 mg Oral Daily   dorzolamide  1 drop Left Eye BID   furosemide  40 mg Intravenous Daily   insulin aspart  0-9 Units Subcutaneous TID WC   memantine  10 mg Oral BID   Netarsudil-Latanoprost  1 drop Left Eye QHS   ofloxacin  1 drop Right Eye QID   prednisoLONE acetate  1 drop Right Eye QID   pregabalin  75 mg Oral Q8H   Tafamidis  61 mg Oral Daily   Continuous Infusions:  PRN Meds: acetaminophen **OR** acetaminophen, metoprolol tartrate  Allergies:    Allergies  Allergen Reactions   Gabapentin     Drowsiness    Social History:   Social History   Socioeconomic History   Marital status: Widowed    Spouse name: Not on file   Number of children: 5   Years of education: HS - 12 years   Highest education level: Not on file  Occupational History   Occupation: Retired  Tobacco Use   Smoking status: Never   Smokeless tobacco: Never  Vaping Use   Vaping Use: Never used  Substance and Sexual Activity   Alcohol use: No   Drug use: No   Sexual activity: Not on file  Other Topics Concern   Not on file  Social History Narrative   Lives at home alone.   Right-handed.   1 cup caffeine daily.   Social  Determinants of Health   Financial Resource Strain: Not on file  Food Insecurity: Not on file  Transportation Needs: Not on file  Physical Activity: Not on file  Stress: Not on file  Social Connections: Not on file  Intimate Partner Violence: Not on file    Family History:    Family History  Problem Relation Age of Onset   Esophageal cancer Mother        smoker   Other Father        Unsure of medical history.   Prostate cancer Brother        d. 88s   Cancer Son        ?? unsure, but may have had cancer   Cancer Son        ?? unsure, but may have had cancer     ROS:  Please see the history of present illness.   All other ROS reviewed and negative.     Physical Exam/Data:   Vitals:   03/10/23 0420 03/10/23 0456 03/10/23 0500 03/10/23 0530  BP:  (!) 96/54    Pulse:  100 61 89  Resp: 18 18    Temp:  (!) 97.2 F (36.2 C)    TempSrc:  Oral    SpO2:  97% 97% 97%  Weight:  71.3 kg    Height:        Intake/Output Summary (Last 24 hours) at 03/10/2023 0841 Last data filed at 03/10/2023 0400 Gross per 24 hour  Intake 524.42 ml  Output 300 ml  Net 224.42 ml      03/10/2023    4:56 AM 03/09/2023   10:42 AM 03/06/2023    6:44 PM  Last 3 Weights  Weight (lbs) 157 lb 3 oz 153 lb 10.6 oz 153 lb 12 oz  Weight (kg) 71.3 kg 69.7 kg 69.741 kg     Body mass index is 27.84 kg/m.  General:  elderly female found sleeping HEENT: normal Neck: + JVD Vascular: No carotid bruits; Distal pulses 2+ bilaterally Cardiac:  irregular rhythm, tachycardic rate Lungs:  clear to auscultation bilaterally, no wheezing, rhonchi or rales  Abd: soft, nontender, no hepatomegaly  Ext: Mild B LE edema Musculoskeletal:  No deformities, BUE and BLE strength normal and equal Skin: warm and dry  Neuro:  CNs 2-12 intact, no focal abnormalities noted Psych:  sleeping  EKG:  The EKG was personally reviewed and demonstrates:  afib with VR 131 Telemetry:  Telemetry was personally reviewed and  demonstrates:  afib with rates 90-130s  Relevant CV Studies:  Echo 03/2021: 1. Consider infiltrative process such as amyloidosis due to severe LV  wall thickening. Left ventricular ejection fraction, by estimation, is  >75%. The left ventricle has hyperdynamic function. The left ventricle has  no regional wall motion  abnormalities. There is severe left ventricular hypertrophy. Left  ventricular diastolic parameters are consistent with Grade I diastolic  dysfunction (impaired relaxation). Elevated left ventricular end-diastolic  pressure.   2. Right ventricular systolic function is normal. The right ventricular  size is normal. There is normal pulmonary artery systolic pressure. The  estimated right ventricular systolic pressure is 0000000 mmHg.   3. Left atrial size was mildly dilated.   4. The pericardial effusion is posterior to the left ventricle. Trivial  pericardial effusion.   5. The mitral valve is abnormal. Trivial mitral valve regurgitation.   6. The aortic valve is tricuspid. Aortic valve regurgitation is not  visualized. Mild aortic valve sclerosis is present, with no evidence of  aortic valve stenosis.   7. The inferior vena cava is normal in size with greater than 50%  respiratory variability, suggesting right atrial pressure of 3 mmHg.    Laboratory Data:  High Sensitivity Troponin:   Recent Labs  Lab 03/09/23 1204 03/09/23 1402  TROPONINIHS 72* 51*     Chemistry Recent Labs  Lab 03/06/23 2243 03/09/23 1214 03/09/23 1402 03/09/23 1943 03/10/23 0233  NA 129* 127* 125*  --  127*  K 4.4 4.3 4.3  --  3.9  CL 95*  --  95*  --  98  CO2 24  --  23  --  22  GLUCOSE 224*  --  254*  --  241*  BUN 30*  --  26*  --  32*  CREATININE 0.91  --  0.89  --  0.90  CALCIUM 9.8  --  8.9  --  9.1  MG  --   --   --  2.1  --   GFRNONAA >60  --  >60  --  >60  ANIONGAP 10  --  7  --  7    Recent Labs  Lab 03/06/23 2243 03/09/23 1402  PROT 5.3* 4.7*  ALBUMIN 3.0* 2.4*   AST 23 34  ALT 36 27  ALKPHOS 49 50  BILITOT 0.8 1.3*   Lipids No results for input(s): "CHOL", "TRIG", "HDL", "LABVLDL", "LDLCALC", "CHOLHDL" in the last 168 hours.  Hematology Recent Labs  Lab 03/06/23 2105 03/09/23 1204 03/09/23 1214 03/10/23 0233  WBC 15.0* 12.3*  --  10.8*  RBC 5.07 4.77  --  4.53  HGB 15.0 14.0 14.3 13.2  HCT 46.0 42.9 42.0 38.3  MCV 90.7 89.9  --  84.5  MCH 29.6 29.4  --  29.1  MCHC 32.6 32.6  --  34.5  RDW 17.5* 17.3*  --  16.9*  PLT 247 149*  --  160   Thyroid  Recent Labs  Lab 03/09/23 1943  TSH 1.813    BNP Recent Labs  Lab 03/07/23 0057 03/09/23 1204  BNP 87.0 660.9*    DDimer No results for input(s): "DDIMER" in the last 168 hours.   Radiology/Studies:  CT Angio Chest Pulmonary Embolism (PE) W or WO Contrast  Result Date: 03/09/2023 CLINICAL DATA:  Peripheral edema and cough, initial encounter EXAM: CT ANGIOGRAPHY CHEST WITH CONTRAST TECHNIQUE: Multidetector CT imaging of the chest was performed using the standard protocol during bolus administration of intravenous contrast. Multiplanar CT image reconstructions and MIPs were obtained to evaluate the vascular anatomy. RADIATION DOSE REDUCTION: This exam was performed according to the departmental dose-optimization program which includes automated exposure control, adjustment of the mA and/or kV according to patient size and/or use of iterative reconstruction technique. CONTRAST:  67mL OMNIPAQUE IOHEXOL 350 MG/ML SOLN COMPARISON:  Chest x-ray from earlier in the same day. FINDINGS: Cardiovascular: Atherosclerotic calcifications of the thoracic aorta are noted. No aneurysmal dilatation is seen. The degree of enhancement of the aorta is limited precluding evaluation for dissection. Heart is enlarged in size. The pulmonary artery shows a normal branching pattern bilaterally. No filling defect to suggest pulmonary embolism is noted. Mediastinum/Nodes: Thoracic inlet is within normal limits. No hilar  or mediastinal adenopathy is noted. The esophagus as visualized is within normal limits. Lungs/Pleura: Mild bibasilar atelectatic changes are noted. No focal infiltrate or sizable effusion is seen. Upper Abdomen: Stable left adrenal adenoma is noted unchanged from previous MRI. No acute abnormality in the upper abdomen is seen. Musculoskeletal: No acute bony abnormality is noted. Degenerative changes of the thoracic spine are seen. Persistent left axillary/lateral breast mass is noted best seen on image 134 measuring up to 2.7 cm. Scattered small axillary lymph nodes are noted stable from the prior exam. Review of the MIP images confirms the above findings. IMPRESSION: Known left breast/left axillary mass similar to that seen on prior exam. This has been previously biopsied No evidence of pulmonary embolism. Stable left adrenal adenoma Aortic Atherosclerosis (ICD10-I70.0). Electronically Signed   By: Inez Catalina M.D.   On: 03/09/2023 19:03   DG Chest Port 1 View  Result Date: 03/09/2023 CLINICAL DATA:  Cough EXAM: PORTABLE CHEST 1 VIEW COMPARISON:  03/06/2023 FINDINGS: Cardiac silhouette is prominent. Aorta is calcified. There is left base consolidation or volume loss. No pneumothorax. IMPRESSION: Small effusion.  Prominent cardiac silhouette. Electronically Signed   By: Sammie Bench M.D.   On: 03/09/2023 13:47   DG Chest 2 View  Result Date: 03/06/2023 CLINICAL DATA:  SOB EXAM: CHEST - 2 VIEW COMPARISON:  04/29/2021. FINDINGS: Cardiac silhouette enlarged. No evidence of pneumothorax or pleural effusion. No evidence of pulmonary edema. Aorta is calcified. There are thoracic degenerative changes. Postop changes right axilla. IMPRESSION: Mildly enlarged cardiac silhouette.  No focal consolidation. Electronically Signed   By: Sammie Bench M.D.   On: 03/06/2023 19:22     Assessment and Plan:   Atrial fibrillation with RVR PAF - previously PAF treated with 180 mg cardizem - rates on arrival in  the 130s - BP did not tolerate up-titration of cardizem gtt - currently on cardizem 180 mg + 12.5 mg lopressor q8hr PRN - TSH stable and WNL - she is largely asymptomatic - BP 101/56 prior to PO cardizem this morning - BP improved from [id number] systolic - I have D/C'ed cardizem, may need to add amiodarone, she has been anticoagulated - could also consider digoxin to help with pressure and rate - sCr 0.9, CrCl 42.5   Chronic anticoagulation - has been doing well on eliquis, appropriately  dosed    Lower extremity swelling Shortness of breath - echo pending - would defer until better rate controlled - BNP now 661, from normal 2 days prior - albumin 2.4, protein 4.7 - CO2 23 - scheduled for 40 mg IV lasix - will need to watch BP, I don't think volume is her main problem   Cardiac amyloidosis - On tafamidis   Elevated troponin HST 72 --> 51 Suspect demand mismatch in the setting of RVR and CHF Not a good candidate for invasive evaluation   Recurrent breast cancer with suspicion for brain mets Hyponatremia Leukocytosis - per primary - UA not concerning for UTI    Risk Assessment/Risk Scores:     CHA2DS2-VASc Score = 7   This indicates a 11.2% annual risk of stroke. The patient's score is based upon: CHF History: 0 HTN History: 1 Diabetes History: 1 Stroke History: 2 Vascular Disease History: 0 Age Score: 2 Gender Score: 1     For questions or updates, please contact [place] Please consult [email or web address] for contact info under    Signed, Ledora Bottcher, PA  03/10/2023 8:41 AM

## 2023-03-10 NOTE — Progress Notes (Signed)
PROGRESS NOTE    Amanda Green  [id number] DOB: Feb 05, 1935 DOA: 03/09/2023 PCP: Nicholas Lose, MD     Brief Narrative:  Amanda Green is a 88 y.o. female with medical history significant of breast cancer s/p right mastectomy in 199 and left lumpectomy in 2022 followed by left mastectomy in 2022 for DCIS and hx of mets to brain, T2DM, HTN, HLD, hx of CVA, memory loss, cardiac amyloid on tafamidis who presented to ED with complaints of shortness of breath and leg swelling.   In the ED, patient was found to be in A Fib RVR and evidence of CHF exacerbation. She was started on cardizem gtt but stopped due to low SBP. She was also given IV lasix for CHF.   New events last 24 hours / Subjective: This morning, patient is resting comfortably in bed. She appears fatigued but arousable. She denies any palpitations or CP.  He remains in A-fib RVR with heart rate up to 130, blood pressure is soft with SBP 90.  Assessment & Plan:   Principal Problem:   Atrial fibrillation with RVR (HCC) Active Problems:   Acute on chronic diastolic CHF (congestive heart failure) (HCC)   Hyponatremia   Elevated troponin   Ductal carcinoma in situ (DCIS) of left breast   Cardiac amyloidosis (HCC)   Controlled type 2 diabetes mellitus without complication, without long-term current use of insulin (HCC)   Essential hypertension   Cognitive impairment   Leukocytosis   A Fib RVR -Followed by Dr. Sallyanne Kuster  -Started on cardizem gtt but stopped due to low SBP -Consult cardiology today, spoke with Dr. Johnsie Cancel -Eliquis -Digoxin  Acute on chronic diastolic CHF Cardiac amyloidosis  -Tafamidis -Lasix on hold -Strict I's and O's, daily weight, fluid restriction -Echocardiogram pending  Demand ischemia -In setting of A Fib RVR and CHF   DCIS left breast with mets to brain -Continue Decadron -Per oncology and radiation oncology  Diabetes mellitus type 2 -Semglee, sliding scale  insulin  Hypertension -Holding antihypertensives due to low blood pressure  Cognitive impairment -Namenda  In agreement with assessment of the pressure ulcer as below:  Pressure Injury 03/09/23 Buttocks Right Stage 2 -  Partial thickness loss of dermis presenting as a shallow open injury with a red, pink wound bed without slough. less than 1cm size. pink/red (Active)  03/09/23 1854  Location: Buttocks  Location Orientation: Right  Staging: Stage 2 -  Partial thickness loss of dermis presenting as a shallow open injury with a red, pink wound bed without slough.  Wound Description (Comments): less than 1cm size. pink/red  Present on Admission: Yes         DVT prophylaxis:  Place TED hose Start: 03/09/23 1723 apixaban (ELIQUIS) tablet 5 mg  Code Status: Full code Family Communication: No family at bedside Disposition Plan:  Status is: Inpatient Remains inpatient appropriate because: A Fib RVR   Consultants:  Cardiology   Procedures:  None   Antimicrobials:  Anti-infectives (From admission, onward)    None        Objective: Vitals:   03/10/23 0420 03/10/23 0456 03/10/23 0500 03/10/23 0530  BP:  (!) 96/54    Pulse:  100 61 89  Resp: 18 18    Temp:  (!) 97.2 F (36.2 C)    TempSrc:  Oral    SpO2:  97% 97% 97%  Weight:  71.3 kg    Height:        Intake/Output Summary (Last 24 hours) at 03/10/2023  [id number] Last data filed at 03/10/2023 0400 Gross per 24 hour  Intake 524.42 ml  Output 300 ml  Net 224.42 ml   Filed Weights   03/09/23 1042 03/10/23 0456  Weight: 69.7 kg 71.3 kg    Examination: General exam: Appears calm and comfortable  Respiratory system: Clear to auscultation. Respiratory effort normal. No respiratory distress. No conversational dyspnea.  Cardiovascular system: S1 & S2 heard, tachycardic, irregular rhythm. No murmurs. + pedal edema. Gastrointestinal system: Abdomen is nondistended, soft and nontender. Normal bowel sounds heard. Central  nervous system: Alert and oriented. No focal neurological deficits. Speech clear.  Extremities: Symmetric in appearance  Skin: No rashes, lesions or ulcers on exposed skin  Psychiatry: Judgement and insight appear normal. Mood & affect appropriate.   Data Reviewed: I have personally reviewed following labs and imaging studies  CBC: Recent Labs  Lab 03/06/23 2105 03/09/23 1204 03/09/23 1214 03/10/23 0233  WBC 15.0* 12.3*  --  10.8*  NEUTROABS 13.7* 11.2*  --   --   HGB 15.0 14.0 14.3 13.2  HCT 46.0 42.9 42.0 38.3  MCV 90.7 89.9  --  84.5  PLT 247 149*  --  0000000   Basic Metabolic Panel: Recent Labs  Lab 03/06/23 2243 03/09/23 1214 03/09/23 1402 03/09/23 1943 03/10/23 0233  NA 129* 127* 125*  --  127*  K 4.4 4.3 4.3  --  3.9  CL 95*  --  95*  --  98  CO2 24  --  23  --  22  GLUCOSE 224*  --  254*  --  241*  BUN 30*  --  26*  --  32*  CREATININE 0.91  --  0.89  --  0.90  CALCIUM 9.8  --  8.9  --  9.1  MG  --   --   --  2.1  --    GFR: Estimated Creatinine Clearance: 42.5 mL/min (by C-G formula based on SCr of 0.9 mg/dL). Liver Function Tests: Recent Labs  Lab 03/06/23 2243 03/09/23 1402  AST 23 34  ALT 36 27  ALKPHOS 49 50  BILITOT 0.8 1.3*  PROT 5.3* 4.7*  ALBUMIN 3.0* 2.4*   Recent Labs  Lab 03/06/23 2243  LIPASE 47   No results for input(s): "AMMONIA" in the last 168 hours. Coagulation Profile: Recent Labs  Lab 03/09/23 1943  INR 1.7*   Cardiac Enzymes: No results for input(s): "CKTOTAL", "CKMB", "CKMBINDEX", "TROPONINI" in the last 168 hours. BNP (last 3 results) No results for input(s): "PROBNP" in the last 8760 hours. HbA1C: No results for input(s): "HGBA1C" in the last 72 hours. CBG: Recent Labs  Lab 03/07/23 0114 03/09/23 1754 03/09/23 1847 03/09/23 2013 03/10/23 0048  GLUCAP 214* 199* 175* 168* 252*   Lipid Profile: No results for input(s): "CHOL", "HDL", "LDLCALC", "TRIG", "CHOLHDL", "LDLDIRECT" in the last 72 hours. Thyroid  Function Tests: Recent Labs    03/09/23 1943  TSH 1.813   Anemia Panel: No results for input(s): "VITAMINB12", "FOLATE", "FERRITIN", "TIBC", "IRON", "RETICCTPCT" in the last 72 hours. Sepsis Labs: No results for input(s): "PROCALCITON", "LATICACIDVEN" in the last 168 hours.  Recent Results (from the past 240 hour(s))  Resp panel by RT-PCR (RSV, Flu A&B, Covid) Anterior Nasal Swab     Status: None   Collection Time: 03/09/23 11:34 AM   Specimen: Anterior Nasal Swab  Result Value Ref Range Status   SARS Coronavirus 2 by RT PCR NEGATIVE NEGATIVE Final   Influenza A by PCR NEGATIVE NEGATIVE  Final   Influenza B by PCR NEGATIVE NEGATIVE Final    Comment: (NOTE) The Xpert Xpress SARS-CoV-2/FLU/RSV plus assay is intended as an aid in the diagnosis of influenza from Nasopharyngeal swab specimens and should not be used as a sole basis for treatment. Nasal washings and aspirates are unacceptable for Xpert Xpress SARS-CoV-2/FLU/RSV testing.  Fact Sheet for Patients: EntrepreneurPulse.com.au  Fact Sheet for Healthcare Providers: IncredibleEmployment.be  This test is not yet approved or cleared by the [place] FDA and has been authorized for detection and/or diagnosis of SARS-CoV-2 by FDA under an Emergency Use Authorization (EUA). This EUA will remain in effect (meaning this test can be used) for the duration of the COVID-19 declaration under Section 564(b)(1) of the Act, 21 U.S.C. section 360bbb-3(b)(1), unless the authorization is terminated or revoked.     Resp Syncytial Virus by PCR NEGATIVE NEGATIVE Final    Comment: (NOTE) Fact Sheet for Patients: EntrepreneurPulse.com.au  Fact Sheet for Healthcare Providers: IncredibleEmployment.be  This test is not yet approved or cleared by the [place] FDA and has been authorized for detection and/or diagnosis of SARS-CoV-2 by FDA under an Emergency Use  Authorization (EUA). This EUA will remain in effect (meaning this test can be used) for the duration of the COVID-19 declaration under Section 564(b)(1) of the Act, 21 U.S.C. section 360bbb-3(b)(1), unless the authorization is terminated or revoked.  Performed at [facility], [place] [address] [place], [place] [address]       Radiology Studies: CT Angio Chest Pulmonary Embolism (PE) W or WO Contrast  Result Date: 03/09/2023 CLINICAL DATA:  Peripheral edema and cough, initial encounter EXAM: CT ANGIOGRAPHY CHEST WITH CONTRAST TECHNIQUE: Multidetector CT imaging of the chest was performed using the standard protocol during bolus administration of intravenous contrast. Multiplanar CT image reconstructions and MIPs were obtained to evaluate the vascular anatomy. RADIATION DOSE REDUCTION: This exam was performed according to the departmental dose-optimization program which includes automated exposure control, adjustment of the mA and/or kV according to patient size and/or use of iterative reconstruction technique. CONTRAST:  54mL OMNIPAQUE IOHEXOL 350 MG/ML SOLN COMPARISON:  Chest x-ray from earlier in the same day. FINDINGS: Cardiovascular: Atherosclerotic calcifications of the thoracic aorta are noted. No aneurysmal dilatation is seen. The degree of enhancement of the aorta is limited precluding evaluation for dissection. Heart is enlarged in size. The pulmonary artery shows a normal branching pattern bilaterally. No filling defect to suggest pulmonary embolism is noted. Mediastinum/Nodes: Thoracic inlet is within normal limits. No hilar or mediastinal adenopathy is noted. The esophagus as visualized is within normal limits. Lungs/Pleura: Mild bibasilar atelectatic changes are noted. No focal infiltrate or sizable effusion is seen. Upper Abdomen: Stable left adrenal adenoma is noted unchanged from previous MRI. No acute abnormality in the upper abdomen is seen. Musculoskeletal: No acute bony  abnormality is noted. Degenerative changes of the thoracic spine are seen. Persistent left axillary/lateral breast mass is noted best seen on image 134 measuring up to 2.7 cm. Scattered small axillary lymph nodes are noted stable from the prior exam. Review of the MIP images confirms the above findings. IMPRESSION: Known left breast/left axillary mass similar to that seen on prior exam. This has been previously biopsied No evidence of pulmonary embolism. Stable left adrenal adenoma Aortic Atherosclerosis (ICD10-I70.0). Electronically Signed   By: Inez Catalina M.D.   On: 03/09/2023 19:03   DG Chest Port 1 View  Result Date: 03/09/2023 CLINICAL DATA:  Cough EXAM: PORTABLE CHEST 1 VIEW COMPARISON:  03/06/2023 FINDINGS: Cardiac silhouette is prominent. Aorta is calcified. There is left base consolidation or volume loss. No pneumothorax. IMPRESSION: Small effusion.  Prominent cardiac silhouette. Electronically Signed   By: Sammie Bench M.D.   On: 03/09/2023 13:47      Scheduled Meds:  apixaban  5 mg Oral BID   atorvastatin  80 mg Oral Daily   brimonidine  1 drop Left Eye BID   And   timolol  1 drop Left Eye BID   dexamethasone  2 mg Oral Q12H   diltiazem  180 mg Oral Daily   dorzolamide  1 drop Left Eye BID   furosemide  40 mg Intravenous Daily   insulin aspart  0-9 Units Subcutaneous TID WC   memantine  10 mg Oral BID   Netarsudil-Latanoprost  1 drop Left Eye QHS   ofloxacin  1 drop Right Eye QID   prednisoLONE acetate  1 drop Right Eye QID   pregabalin  75 mg Oral Q8H   Tafamidis  61 mg Oral Daily   Continuous Infusions:   LOS: 1 day   Time spent: 35 minutes   Dessa Phi, DO [facility] 03/10/2023, 8:24 AM   Available via Epic secure chat 7am-7pm After these hours, please refer to coverage provider listed on [email or web address]

## 2023-03-10 NOTE — Progress Notes (Signed)
Tried couple of times to obtain ted hoses with Network engineer. Unsuccessful.

## 2023-03-10 NOTE — Progress Notes (Signed)
Daughter [name] brought meds Vyndamax (Tafamidis) 61mg  capsules. It was counted together. 20 capsules in total are in box.

## 2023-03-10 NOTE — Plan of Care (Signed)
  Problem: Clinical Measurements: Goal: Ability to maintain clinical measurements within normal limits will improve Outcome: Progressing Goal: Cardiovascular complication will be avoided Outcome: Progressing   Problem: Activity: Goal: Risk for activity intolerance will decrease Outcome: Progressing   

## 2023-03-10 NOTE — Progress Notes (Addendum)
   03/10/23 0418  Assess: MEWS Score  MEWS Temp 0  MEWS Systolic 1  MEWS Pulse 2  MEWS RR 0  MEWS LOC 0  MEWS Score 3  MEWS Score Color Yellow  Assess: if the MEWS score is Yellow or Red  Were vital signs taken at a resting state? Yes  Focused Assessment Change from prior assessment (see assessment flowsheet)  Does the patient meet 2 or more of the SIRS criteria? No  Does the patient have a confirmed or suspected source of infection? No  Provider and Rapid Response Notified? Yes  MEWS guidelines implemented  Yes, yellow  Treat  MEWS Interventions Considered administering scheduled or prn medications/treatments as ordered  Take Vital Signs  Increase Vital Sign Frequency  Yellow: Q2hr x1, continue Q4hrs until patient remains green for 12hrs  Escalate  MEWS: Escalate Yellow: Discuss with charge nurse and consider notifying provider and/or RRT  Notify: Charge Nurse/RN  Name of Charge Nurse/RN Notified Presenter, broadcasting  Provider Notification  Provider Name/Title Howerter DO  Date Provider Notified 03/10/23  Time Provider Notified 0422  Method of Notification Page  Notification Reason Other (Comment) (Yellow MEWS - HR)   Intermittent HR to 120's/130's, does not sustain. HR 100's, 110's.

## 2023-03-11 ENCOUNTER — Inpatient Hospital Stay (HOSPITAL_COMMUNITY): Payer: Medicare PPO

## 2023-03-11 DIAGNOSIS — I4891 Unspecified atrial fibrillation: Secondary | ICD-10-CM | POA: Diagnosis not present

## 2023-03-11 LAB — CBC
HCT: 40 % (ref 36.0–46.0)
Hemoglobin: 14 g/dL (ref 12.0–15.0)
MCH: 29.1 pg (ref 26.0–34.0)
MCHC: 35 g/dL (ref 30.0–36.0)
MCV: 83.2 fL (ref 80.0–100.0)
Platelets: 172 10*3/uL (ref 150–400)
RBC: 4.81 MIL/uL (ref 3.87–5.11)
RDW: 16.7 % — ABNORMAL HIGH (ref 11.5–15.5)
WBC: 10.3 10*3/uL (ref 4.0–10.5)
nRBC: 0 % (ref 0.0–0.2)

## 2023-03-11 LAB — BASIC METABOLIC PANEL
Anion gap: 7 (ref 5–15)
BUN: 45 mg/dL — ABNORMAL HIGH (ref 8–23)
CO2: 22 mmol/L (ref 22–32)
Calcium: 9 mg/dL (ref 8.9–10.3)
Chloride: 95 mmol/L — ABNORMAL LOW (ref 98–111)
Creatinine, Ser: 1.36 mg/dL — ABNORMAL HIGH (ref 0.44–1.00)
GFR, Estimated: 38 mL/min — ABNORMAL LOW (ref >60)
Glucose, Bld: 156 mg/dL — ABNORMAL HIGH (ref 70–99)
Potassium: 4.2 mmol/L (ref 3.5–5.1)
Sodium: 124 mmol/L — ABNORMAL LOW (ref 135–145)

## 2023-03-11 LAB — ECHOCARDIOGRAM COMPLETE: Height: 63 in

## 2023-03-11 LAB — GLUCOSE, CAPILLARY
Glucose-Capillary: 142 mg/dL — ABNORMAL HIGH (ref 70–99)
Glucose-Capillary: 159 mg/dL — ABNORMAL HIGH (ref 70–99)
Glucose-Capillary: 150 mg/dL — ABNORMAL HIGH (ref 70–99)
Glucose-Capillary: 123 mg/dL — ABNORMAL HIGH (ref 70–99)

## 2023-03-11 LAB — MAGNESIUM: Magnesium: 2.2 mg/dL (ref 1.7–2.4)

## 2023-03-11 MED ORDER — SODIUM CHLORIDE 0.9 % IV BOLUS
1000.0000 mL | Freq: Once | INTRAVENOUS | Status: AC
  Administered 2023-03-11: 1000 mL via INTRAVENOUS

## 2023-03-11 NOTE — Progress Notes (Signed)
Pts HR has been fluctuating one teens to 140's throughout the night. Mostly to 120's. Unable to give PRN metoprolol this am d/t SBP <110(see parameters).

## 2023-03-11 NOTE — Progress Notes (Signed)
PROGRESS NOTE    Amanda Green  [id number] DOB: January 27, 1935 DOA: 03/09/2023 PCP: Nicholas Lose, MD     Brief Narrative:  Amanda Green is a 88 y.o. female with medical history significant of breast cancer s/p right mastectomy in 199 and left lumpectomy in 2022 followed by left mastectomy in 2022 for DCIS and hx of mets to brain, T2DM, HTN, HLD, hx of CVA, memory loss, cardiac amyloid on tafamidis who presented to ED with complaints of shortness of breath and leg swelling.   In the ED, patient was found to be in A Fib RVR and evidence of CHF exacerbation. She was started on cardizem gtt but stopped due to low SBP. She was also given IV lasix for CHF.   New events last 24 hours / Subjective: Patient very lethargic this morning.  Daughters are at bedside.  Heart rate elevated 120-150s.  She tells me she feels "fine" and has no complaints.  Daughter is at bedside states that patient usually never complains of anything.  Due to lethargy and being on Eliquis with known brain mets, patient underwent stat head CT this morning to rule out hemorrhage.  CT head without acute findings.  Shortly after getting back from CT head, I was notified by RN that patient had low blood pressure.  Patient was reevaluated.  She was alert and oriented, but  remained fatigued and lethargic.  Still remains with tachycardia and irregular rhythm with A-fib RVR.  Discussed with daughter at bedside.  Patient is widowed, has 3 daughters.  Her 1 daughter from [place] is currently on her way down to [place].  Discussed with her patient's grave illness, recommendation for palliative care consultation.  Assessment & Plan:   Principal Problem:   Atrial fibrillation with RVR (HCC) Active Problems:   Acute on chronic diastolic CHF (congestive heart failure) (HCC)   Hyponatremia   Elevated troponin   Ductal carcinoma in situ (DCIS) of left breast   Cardiac amyloidosis (HCC)   Controlled type 2 diabetes mellitus  without complication, without long-term current use of insulin (HCC)   Essential hypertension   Cognitive impairment   Leukocytosis   A Fib RVR -Followed by Dr. Sallyanne Kuster  -Started on cardizem gtt but stopped due to low SBP -Cardiology following, discussed with Dr. Margaretann Loveless  -Eliquis - discontinued due to concern for brain bleed in setting of worsening mentation and brain mets  -Digoxin, ?Amiodarone   Acute on chronic diastolic CHF Cardiac amyloidosis  -Tafamidis -Lasix on hold -Strict I's and O's, daily weight, fluid restriction -Echocardiogram pending once HR improved   AKI -Baseline Cr 0.9  -Giving IVF bolus in setting of hypotension   Acute metabolic encephalopathy -CT head without new/acute abnormality. Known left occipital mass   Demand ischemia -In setting of A Fib RVR and CHF   DCIS left breast with mets to brain -Continue Decadron -Per oncology and radiation oncology  Diabetes mellitus type 2 -Semglee, sliding scale insulin  Hypertension -Holding antihypertensives due to low blood pressure  Cognitive impairment -Namenda  Goals of care -Patient with declining mental status, uncontrolled A Fib RVR with hypotension in setting of known metastatic breast cancer. Consult palliative care today.     In agreement with assessment of the pressure ulcer as below:  Pressure Injury 03/09/23 Buttocks Right Stage 2 -  Partial thickness loss of dermis presenting as a shallow open injury with a red, pink wound bed without slough. less than 1cm size. pink/red (Active)  03/09/23 1854  Location:  Buttocks  Location Orientation: Right  Staging: Stage 2 -  Partial thickness loss of dermis presenting as a shallow open injury with a red, pink wound bed without slough.  Wound Description (Comments): less than 1cm size. pink/red  Present on Admission: Yes  Dressing Type Foam - Lift dressing to assess site every shift 03/11/23 0830         DVT prophylaxis:  Place TED hose  Start: 03/09/23 1723  Code Status: Full code Family Communication: Daughter  Disposition Plan:  Status is: Inpatient Remains inpatient appropriate because: A Fib RVR   Consultants:  Cardiology  Palliative care   Procedures:  None   Antimicrobials:  Anti-infectives (From admission, onward)    None        Objective: Vitals:   03/11/23 0345 03/11/23 0736 03/11/23 0842 03/11/23 1205  BP:  101/61 96/62 (!) 89/57  Pulse: (!) 55 99  71  Resp:  18  20  Temp:  (!) 97.5 F (36.4 C)  (!) 97.5 F (36.4 C)  TempSrc:  Axillary  Oral  SpO2: 96% 96%  97%  Weight:      Height:        Intake/Output Summary (Last 24 hours) at 03/11/2023 1216 Last data filed at 03/11/2023 1208 Gross per 24 hour  Intake --  Output 2 ml  Net -2 ml    Filed Weights   03/09/23 1042 03/10/23 0456 03/11/23 0336  Weight: 69.7 kg 71.3 kg 74.3 kg    Examination: General exam: Appears fatigued, lethargic  Respiratory system: Clear to auscultation. Respiratory effort normal.  Cardiovascular system: S1 & S2 heard, tachycardic, irregular rhythm.  Gastrointestinal system: Abdomen is nondistended, soft  Central nervous system: Alert and oriented. No focal neurological deficits.  Extremities: Symmetric in appearance  Skin: No rashes, lesions or ulcers on exposed skin   Data Reviewed: I have personally reviewed following labs and imaging studies  CBC: Recent Labs  Lab 03/06/23 2105 03/09/23 1204 03/09/23 1214 03/10/23 0233 03/11/23 0156  WBC 15.0* 12.3*  --  10.8* 10.3  NEUTROABS 13.7* 11.2*  --   --   --   HGB 15.0 14.0 14.3 13.2 14.0  HCT 46.0 42.9 42.0 38.3 40.0  MCV 90.7 89.9  --  84.5 83.2  PLT 247 149*  --  160 [id number]    Basic Metabolic Panel: Recent Labs  Lab 03/06/23 2243 03/09/23 1214 03/09/23 1402 03/09/23 1943 03/10/23 0233 03/11/23 0156  NA 129* 127* 125*  --  127* 124*  K 4.4 4.3 4.3  --  3.9 4.2  CL 95*  --  95*  --  98 95*  CO2 24  --  23  --  22 22  GLUCOSE 224*  --   254*  --  241* 156*  BUN 30*  --  26*  --  32* 45*  CREATININE 0.91  --  0.89  --  0.90 1.36*  CALCIUM 9.8  --  8.9  --  9.1 9.0  MG  --   --   --  2.1  --  2.2    GFR: Estimated Creatinine Clearance: 28.7 mL/min (A) (by C-G formula based on SCr of 1.36 mg/dL (H)). Liver Function Tests: Recent Labs  Lab 03/06/23 2243 03/09/23 1402  AST 23 34  ALT 36 27  ALKPHOS 49 50  BILITOT 0.8 1.3*  PROT 5.3* 4.7*  ALBUMIN 3.0* 2.4*    Recent Labs  Lab 03/06/23 2243  LIPASE 47    No results  for input(s): "AMMONIA" in the last 168 hours. Coagulation Profile: Recent Labs  Lab 03/09/23 1943  INR 1.7*    Cardiac Enzymes: No results for input(s): "CKTOTAL", "CKMB", "CKMBINDEX", "TROPONINI" in the last 168 hours. BNP (last 3 results) No results for input(s): "PROBNP" in the last 8760 hours. HbA1C: No results for input(s): "HGBA1C" in the last 72 hours. CBG: Recent Labs  Lab 03/10/23 1141 03/10/23 1731 03/10/23 2341 03/11/23 0735 03/11/23 1154  GLUCAP 308* 185* 133* 142* 159*    Lipid Profile: No results for input(s): "CHOL", "HDL", "LDLCALC", "TRIG", "CHOLHDL", "LDLDIRECT" in the last 72 hours. Thyroid Function Tests: Recent Labs    03/09/23 1943  TSH 1.813    Anemia Panel: No results for input(s): "VITAMINB12", "FOLATE", "FERRITIN", "TIBC", "IRON", "RETICCTPCT" in the last 72 hours. Sepsis Labs: No results for input(s): "PROCALCITON", "LATICACIDVEN" in the last 168 hours.  Recent Results (from the past 240 hour(s))  Resp panel by RT-PCR (RSV, Flu A&B, Covid) Anterior Nasal Swab     Status: None   Collection Time: 03/09/23 11:34 AM   Specimen: Anterior Nasal Swab  Result Value Ref Range Status   SARS Coronavirus 2 by RT PCR NEGATIVE NEGATIVE Final   Influenza A by PCR NEGATIVE NEGATIVE Final   Influenza B by PCR NEGATIVE NEGATIVE Final    Comment: (NOTE) The Xpert Xpress SARS-CoV-2/FLU/RSV plus assay is intended as an aid in the diagnosis of influenza from  Nasopharyngeal swab specimens and should not be used as a sole basis for treatment. Nasal washings and aspirates are unacceptable for Xpert Xpress SARS-CoV-2/FLU/RSV testing.  Fact Sheet for Patients: EntrepreneurPulse.com.au  Fact Sheet for Healthcare Providers: IncredibleEmployment.be  This test is not yet approved or cleared by the [place] FDA and has been authorized for detection and/or diagnosis of SARS-CoV-2 by FDA under an Emergency Use Authorization (EUA). This EUA will remain in effect (meaning this test can be used) for the duration of the COVID-19 declaration under Section 564(b)(1) of the Act, 21 U.S.C. section 360bbb-3(b)(1), unless the authorization is terminated or revoked.     Resp Syncytial Virus by PCR NEGATIVE NEGATIVE Final    Comment: (NOTE) Fact Sheet for Patients: EntrepreneurPulse.com.au  Fact Sheet for Healthcare Providers: IncredibleEmployment.be  This test is not yet approved or cleared by the [place] FDA and has been authorized for detection and/or diagnosis of SARS-CoV-2 by FDA under an Emergency Use Authorization (EUA). This EUA will remain in effect (meaning this test can be used) for the duration of the COVID-19 declaration under Section 564(b)(1) of the Act, 21 U.S.C. section 360bbb-3(b)(1), unless the authorization is terminated or revoked.  Performed at [facility], [place] [address], [place], [place] [address]       Radiology Studies: CT HEAD WO CONTRAST (5MM)  Result Date: 03/11/2023 CLINICAL DATA:  Mental status change, unknown cause rule out brain bleed given known brain mets and altered mental status EXAM: CT HEAD WITHOUT CONTRAST TECHNIQUE: Contiguous axial images were obtained from the base of the skull through the vertex without intravenous contrast. RADIATION DOSE REDUCTION: This exam was performed according to the departmental  dose-optimization program which includes automated exposure control, adjustment of the mA and/or kV according to patient size and/or use of iterative reconstruction technique. COMPARISON:  MRI head February 29, 24. FINDINGS: Brain: Left occipital mass, compatible with known metastasis. No evidence of acute hemorrhage, significant midline shift, hydrocephalus. Patchy white matter hypodensities, nonspecific but compatible with chronic microvascular ischemic disease. Redemonstrated nodular gray matter  heterotopia, better characterized on prior MRI. Vascular: Calcific atherosclerosis. Skull: No acute fracture. Sinuses/Orbits: Clear sinuses.  No acute orbital findings. Other: No mastoid effusions. IMPRESSION: 1. Left occipital mass, compatible with known metastasis. MRI with contrast could better characterize. 2. Redemonstrated nodular gray matter heterotopia, better characterized on prior MRI. 3. No evidence of new/interval acute abnormality. Electronically Signed   By: Margaretha Sheffield M.D.   On: 03/11/2023 11:51   CT Angio Chest Pulmonary Embolism (PE) W or WO Contrast  Result Date: 03/09/2023 CLINICAL DATA:  Peripheral edema and cough, initial encounter EXAM: CT ANGIOGRAPHY CHEST WITH CONTRAST TECHNIQUE: Multidetector CT imaging of the chest was performed using the standard protocol during bolus administration of intravenous contrast. Multiplanar CT image reconstructions and MIPs were obtained to evaluate the vascular anatomy. RADIATION DOSE REDUCTION: This exam was performed according to the departmental dose-optimization program which includes automated exposure control, adjustment of the mA and/or kV according to patient size and/or use of iterative reconstruction technique. CONTRAST:  57mL OMNIPAQUE IOHEXOL 350 MG/ML SOLN COMPARISON:  Chest x-ray from earlier in the same day. FINDINGS: Cardiovascular: Atherosclerotic calcifications of the thoracic aorta are noted. No aneurysmal dilatation is seen. The degree  of enhancement of the aorta is limited precluding evaluation for dissection. Heart is enlarged in size. The pulmonary artery shows a normal branching pattern bilaterally. No filling defect to suggest pulmonary embolism is noted. Mediastinum/Nodes: Thoracic inlet is within normal limits. No hilar or mediastinal adenopathy is noted. The esophagus as visualized is within normal limits. Lungs/Pleura: Mild bibasilar atelectatic changes are noted. No focal infiltrate or sizable effusion is seen. Upper Abdomen: Stable left adrenal adenoma is noted unchanged from previous MRI. No acute abnormality in the upper abdomen is seen. Musculoskeletal: No acute bony abnormality is noted. Degenerative changes of the thoracic spine are seen. Persistent left axillary/lateral breast mass is noted best seen on image 134 measuring up to 2.7 cm. Scattered small axillary lymph nodes are noted stable from the prior exam. Review of the MIP images confirms the above findings. IMPRESSION: Known left breast/left axillary mass similar to that seen on prior exam. This has been previously biopsied No evidence of pulmonary embolism. Stable left adrenal adenoma Aortic Atherosclerosis (ICD10-I70.0). Electronically Signed   By: Inez Catalina M.D.   On: 03/09/2023 19:03   DG Chest Port 1 View  Result Date: 03/09/2023 CLINICAL DATA:  Cough EXAM: PORTABLE CHEST 1 VIEW COMPARISON:  03/06/2023 FINDINGS: Cardiac silhouette is prominent. Aorta is calcified. There is left base consolidation or volume loss. No pneumothorax. IMPRESSION: Small effusion.  Prominent cardiac silhouette. Electronically Signed   By: Sammie Bench M.D.   On: 03/09/2023 13:47      Scheduled Meds:  atorvastatin  80 mg Oral Daily   brimonidine  1 drop Left Eye BID   And   timolol  1 drop Left Eye BID   dexamethasone  2 mg Oral Q12H   digoxin  0.125 mg Oral Daily   dorzolamide  1 drop Left Eye BID   insulin aspart  0-9 Units Subcutaneous TID WC   insulin glargine-yfgn   10 Units Subcutaneous Daily   memantine  10 mg Oral BID   ofloxacin  1 drop Right Eye QID   prednisoLONE acetate  1 drop Right Eye QID   pregabalin  75 mg Oral Q8H   Tafamidis  61 mg Oral Daily   Continuous Infusions:   LOS: 2 days   Time spent: 35 minutes   Dessa Phi, DO [facility] 03/11/2023, 12:16 PM   Available via Epic secure chat 7am-7pm After these hours, please refer to coverage provider listed on [email or web address]

## 2023-03-11 NOTE — Progress Notes (Signed)
Echocardiogram Incomplete, patients heart rate is too high. Will try later.   Amanda Green 03/11/2023, 10:57 AM

## 2023-03-11 NOTE — Progress Notes (Signed)
[facility] night cross cover note:   I was notified by RN that the patient has not spontaneously voided any urine since an episode of incontinence around 1400 today.  Ensuing bladder scan has revealed greater than 500 cc.  I subsequently placed order for straight cath x 1 now.   Babs Bertin, DO Hospitalist

## 2023-03-11 NOTE — Progress Notes (Signed)
Pt has not voided since small incontinent episode earlier today(per report). Bladder firm on palpation. Pt also received a 1L bolus today for low BP. Provider on call paged via amion for I&O cath order.

## 2023-03-11 NOTE — Progress Notes (Addendum)
Rounding Note    Patient Name: Man Poyser Date of Encounter: 03/11/2023  [place] Cardiologist: Sanda Klein, MD   Subjective   Patient is still in atrial fibrillation with rates ranging in the 130s to 160s this morning. She is very lethargic this morning. She will open her eyes briefly for me and will follow some commands but not all. Spoke with RN and she has been alert and oriented x2 (person and place). She denies any palpitations or  chest pain and states her breathing has improved. Her 2 daughters are at bed side and sounds like this lethargy is new over the last few days to week.   Inpatient Medications    Scheduled Meds:  apixaban  5 mg Oral BID   atorvastatin  80 mg Oral Daily   brimonidine  1 drop Left Eye BID   And   timolol  1 drop Left Eye BID   dexamethasone  2 mg Oral Q12H   digoxin  0.125 mg Oral Daily   dorzolamide  1 drop Left Eye BID   insulin aspart  0-9 Units Subcutaneous TID WC   insulin glargine-yfgn  10 Units Subcutaneous Daily   memantine  10 mg Oral BID   Netarsudil-Latanoprost  1 drop Left Eye QHS   ofloxacin  1 drop Right Eye QID   prednisoLONE acetate  1 drop Right Eye QID   pregabalin  75 mg Oral Q8H   Tafamidis  61 mg Oral Daily   Continuous Infusions:  PRN Meds: acetaminophen **OR** acetaminophen, metoprolol tartrate   Vital Signs    Vitals:   03/10/23 2342 03/11/23 0249 03/11/23 0336 03/11/23 0345  BP: 101/62  100/65   Pulse: 81 (!) 40 (!) 114 (!) 55  Resp: 16  17   Temp: 97.6 F (36.4 C)  (!) 97.3 F (36.3 C)   TempSrc: Oral  Oral   SpO2: 97% 97% 97% 96%  Weight:   74.3 kg   Height:        Intake/Output Summary (Last 24 hours) at 03/11/2023 0618 Last data filed at 03/10/2023 1640 Gross per 24 hour  Intake --  Output 1 ml  Net -1 ml      03/11/2023    3:36 AM 03/10/2023    4:56 AM 03/09/2023   10:42 AM  Last 3 Weights  Weight (lbs) 163 lb 12.8 oz 157 lb 3 oz 153 lb 10.6 oz  Weight (kg) 74.3 kg  71.3 kg 69.7 kg      Telemetry    Atrial fibrillation with rates in the 130s to 160s. - Personally Reviewed  ECG    No new ECG tracing today. - Personally Reviewed  Physical Exam   GEN: African-American female resting comfortably in no acute distress.   Neck: No JVD. Cardiac: Tachycardic with irregularly irregular rhythm. No murmurs, rubs, or gallops.  Respiratory: Clear to auscultation bilaterally. No significant wheezes, rhonchi, or rales appreciated. GI: Soft, nontender, non-distended  MS: Trace lower extremity edema (left right). Skin: Right upper and lower extremity cool to the touch. Neuro:  Very lethargic. Would follow some commands but not all. Would not track by finger with her eyes so unable to assess EOMs.  No obvious facial asymmetry. Called and spoke with Neurology who agreed with stopping Eliquis. Psych: Lethargic.   Labs    High Sensitivity Troponin:   Recent Labs  Lab 03/09/23 1204 03/09/23 1402  TROPONINIHS 72* 51*     Chemistry Recent Labs  Lab  03/06/23 2243 03/09/23 1214 03/09/23 1402 03/09/23 1943 03/10/23 0233 03/11/23 0156  NA 129*   < > 125*  --  127* 124*  K 4.4   < > 4.3  --  3.9 4.2  CL 95*  --  95*  --  98 95*  CO2 24  --  23  --  22 22  GLUCOSE 224*  --  254*  --  241* 156*  BUN 30*  --  26*  --  32* 45*  CREATININE 0.91  --  0.89  --  0.90 1.36*  CALCIUM 9.8  --  8.9  --  9.1 9.0  MG  --   --   --  2.1  --  2.2  PROT 5.3*  --  4.7*  --   --   --   ALBUMIN 3.0*  --  2.4*  --   --   --   AST 23  --  34  --   --   --   ALT 36  --  27  --   --   --   ALKPHOS 49  --  50  --   --   --   BILITOT 0.8  --  1.3*  --   --   --   GFRNONAA >60  --  >60  --  >60 38*  ANIONGAP 10  --  7  --  7 7   < > = values in this interval not displayed.    Lipids No results for input(s): "CHOL", "TRIG", "HDL", "LABVLDL", "LDLCALC", "CHOLHDL" in the last 168 hours.  Hematology Recent Labs  Lab 03/09/23 1204 03/09/23 1214 03/10/23 0233 03/11/23 0156   WBC 12.3*  --  10.8* 10.3  RBC 4.77  --  4.53 4.81  HGB 14.0 14.3 13.2 14.0  HCT 42.9 42.0 38.3 40.0  MCV 89.9  --  84.5 83.2  MCH 29.4  --  29.1 29.1  MCHC 32.6  --  34.5 35.0  RDW 17.3*  --  16.9* 16.7*  PLT 149*  --  160 172   Thyroid  Recent Labs  Lab 03/09/23 1943  TSH 1.813    BNP Recent Labs  Lab 03/07/23 0057 03/09/23 1204  BNP 87.0 660.9*    DDimer No results for input(s): "DDIMER" in the last 168 hours.   Radiology    CT Angio Chest Pulmonary Embolism (PE) W or WO Contrast  Result Date: 03/09/2023 CLINICAL DATA:  Peripheral edema and cough, initial encounter EXAM: CT ANGIOGRAPHY CHEST WITH CONTRAST TECHNIQUE: Multidetector CT imaging of the chest was performed using the standard protocol during bolus administration of intravenous contrast. Multiplanar CT image reconstructions and MIPs were obtained to evaluate the vascular anatomy. RADIATION DOSE REDUCTION: This exam was performed according to the departmental dose-optimization program which includes automated exposure control, adjustment of the mA and/or kV according to patient size and/or use of iterative reconstruction technique. CONTRAST:  68mL OMNIPAQUE IOHEXOL 350 MG/ML SOLN COMPARISON:  Chest x-ray from earlier in the same day. FINDINGS: Cardiovascular: Atherosclerotic calcifications of the thoracic aorta are noted. No aneurysmal dilatation is seen. The degree of enhancement of the aorta is limited precluding evaluation for dissection. Heart is enlarged in size. The pulmonary artery shows a normal branching pattern bilaterally. No filling defect to suggest pulmonary embolism is noted. Mediastinum/Nodes: Thoracic inlet is within normal limits. No hilar or mediastinal adenopathy is noted. The esophagus as visualized is within normal limits. Lungs/Pleura: Mild bibasilar atelectatic changes are  noted. No focal infiltrate or sizable effusion is seen. Upper Abdomen: Stable left adrenal adenoma is noted unchanged from  previous MRI. No acute abnormality in the upper abdomen is seen. Musculoskeletal: No acute bony abnormality is noted. Degenerative changes of the thoracic spine are seen. Persistent left axillary/lateral breast mass is noted best seen on image 134 measuring up to 2.7 cm. Scattered small axillary lymph nodes are noted stable from the prior exam. Review of the MIP images confirms the above findings. IMPRESSION: Known left breast/left axillary mass similar to that seen on prior exam. This has been previously biopsied No evidence of pulmonary embolism. Stable left adrenal adenoma Aortic Atherosclerosis (ICD10-I70.0). Electronically Signed   By: Inez Catalina M.D.   On: 03/09/2023 19:03   DG Chest Port 1 View  Result Date: 03/09/2023 CLINICAL DATA:  Cough EXAM: PORTABLE CHEST 1 VIEW COMPARISON:  03/06/2023 FINDINGS: Cardiac silhouette is prominent. Aorta is calcified. There is left base consolidation or volume loss. No pneumothorax. IMPRESSION: Small effusion.  Prominent cardiac silhouette. Electronically Signed   By: Sammie Bench M.D.   On: 03/09/2023 13:47    Cardiac Studies   Echocardiogram 04/15/2021: Impressions: 1. Consider infiltrative process such as amyloidosis due to severe LV  wall thickening. Left ventricular ejection fraction, by estimation, is  >75%. The left ventricle has hyperdynamic function. The left ventricle has  no regional wall motion  abnormalities. There is severe left ventricular hypertrophy. Left  ventricular diastolic parameters are consistent with Grade I diastolic  dysfunction (impaired relaxation). Elevated left ventricular end-diastolic  pressure.   2. Right ventricular systolic function is normal. The right ventricular  size is normal. There is normal pulmonary artery systolic pressure. The  estimated right ventricular systolic pressure is 0000000 mmHg.   3. Left atrial size was mildly dilated.   4. The pericardial effusion is posterior to the left ventricle. Trivial   pericardial effusion.   5. The mitral valve is abnormal. Trivial mitral valve regurgitation.   6. The aortic valve is tricuspid. Aortic valve regurgitation is not  visualized. Mild aortic valve sclerosis is present, with no evidence of  aortic valve stenosis.   7. The inferior vena cava is normal in size with greater than 50%  respiratory variability, suggesting right atrial pressure of 3 mmHg.   Comparison(s): Unable to locate echo for comparison.  _______________  PYP Scan 12/14/2021:   By semi-quantitative assessment scan is consistent with heart uptake equal to rib uptake-Grade 2. Heart to contralateral lung ratio is between 1-1.5, indeterminate for amyloid.   Study is strongly suggestive of TTR amyloidosis (visual score of 2-3/ratio >1.5).   Prior study not available for comparison.   Overall cardiac uptake is similar to rib, suggestive of TTR amyloidosis.   Patient Profile     88 y.o. female with a history of cardiac amyloidosis on Tafamis, paroxysmal atrial fibrillation on Eliquis, hypertension, hyperlipidemia, type 2 diabetes mellitus, prior CVA, memory loss, and breast cancer s/p right mastectomy in 1999 and left mastectomy in 2022 now with likely metastasis to the brain based on brain MRI in 01/2023 who was admitted on 03/09/2023 for atrial fibrillation with RVR after presenting with increased shortness of breath and swelling.  Assessment & Plan    Atrial Fibrillation with RVR Patient has a history of paroxysmal atrial fibrillation. She presented with increased shortness of breath and swelling for the past week and was found to be in rapid atrial fibrillation with rates in the 130s. Electrolytes and TSH within normal limits.  She was initially treated with IV Cardizem but this had to be stopped due to soft BP and she was started on Digoxin. - Rates in the 130s to 160s this morning. - Echo has been ordered but not done yet. Would like to try to get heart rate more controlled  before this. - Continue Digoxin 0.125mg  daily.  - Home Diltiazem on hold due to hypotension. - Also on Lopressor 12.5mg  every 8 hours as needed. However, BP too low for this right now. - Only options at this time are continuing with Digoxin load or switching to Amiodarone. Discussed with Pharmacy. She does have some AKI this morning with increase in creatinine from 0.9 to 1.3. If we continue with Digoxin, Pharmacy recommended one time dose of IV Digoxin 0.25mg  and then continuing 0.125mg  daily. Will discuss with MD. - On chronic anticoagulation with Eliquis at home. However, this has been stopped due to brain metastasis with associated regional mass effect with up to a 46mm left to right shift and concern for CNS bleed. Stop Eliquis for now. She is very lethargic this morning. Called and spoke with Neurology who agreed with stopping Eliquis and recommended non-contrast head CT with further recommendations to follow. Will order a STAT head CT to rule out brain bleed.   Cardiac Amyloidosis  Acute on Chronic Diastolic CHF Echo in [id number] showed LVEF >75% with severe LVH and grade 1 diastolic dysfunction. PYP scan in 11/2021 was strongly suggestive of TTR amyloidosis. Myeloma panel was negative. She presented with increased shortness of breath and swelling. BNP elevated at 660. Chest x-ray showed prominent cardiac silhouette with small effusion. Chest CTA was negative for PE. She received one dose of IV Lasix yesterday. No documented urinary I/Os yesterday.  - Euvolemic on exam.  - Continue Tafamidis 61mg  daily.  - Home Lisinopril and HCTZ on hold due to soft BP. - Continue to monitor volume status closely.  Demand Ischemia High-sensitivity troponin mildly elevated and flat at 72 >> 51 not consistent with ACS. - No chest pain.  - Echo pending. - Troponin trend consistent with demand ischemic from rapid atrial fibrillation and CHF. No plans for ischemic evaluation at this time.  Hypertension BP soft  but stable.  - Continue to hold home Lisinopril, HCTZ, and Diltiazem.  Hyperlipidemia - Continue Lipitor 80mg  daily.  AKI Creatinine up from 0.90 yesterday to 1.36 today. - Continue to monitor closely.  Altered Mental Status Lethargy Patient is very lethargic today. She would follow a some commands for me this morning but not all. She was not able to track my finger with her eyes. RN said she has only been alert and orient x2 to person and place. - Will order STAT non-contrast head CT to rule out brain bleed  - Also recommended consulting Palliative Care breast cancer with metastasis to the brain.  Otherwise, per primary team: - Hyponatremia - Type 2 diabetes mellitus - Memory loss - Breast cancer with metastasis to the brain  For questions or updates, please contact [place] Please consult [email or web address] for contact info under        Signed, Darreld Mclean, PA-C  03/11/2023, 6:18 AM    Patient seen and examined with Sande Rives PA-C.  Agree as above, with the following exceptions and changes as noted below. Challenging situation. Pt with known brain mets now with decreased level of consciousness though she does respond to voice and answers questions. This is a new change as of a few weeks ago.  I am very concerned about the possibility of progression of intracranial processes and with Eliquis on board until today, cannot exclude bleeding with change in mentation. She needs to go for Head CT. I have recommended to primary team to get palliative care involved today, and have informed her daughter that I think this is a critical conversation to have today to ensure we plan goals of care appropriately while patient may still be able to participate. Atrial fibrillation may be a secondary issue that this point, however we will try to rate control for the benefit of current hypotension. Digoxin load as above. Hold eliquis. Echo images reviewed, severe LVH, preserved LVEF,  exam stopped due to elevated HR and will continue if rates are better controlled. Gen: somnolent, CV irregular and tachycardic, lungs coarse throughout, nonlabored, no edema, Neuro/Psych: somnolent, arousable. All available labs, radiology testing, previous records reviewed.   Elouise Munroe, MD 03/11/23 11:33 AM

## 2023-03-12 ENCOUNTER — Other Ambulatory Visit (HOSPITAL_COMMUNITY): Payer: BC Managed Care – PPO

## 2023-03-12 DIAGNOSIS — Z7189 Other specified counseling: Secondary | ICD-10-CM

## 2023-03-12 DIAGNOSIS — E854 Organ-limited amyloidosis: Secondary | ICD-10-CM

## 2023-03-12 DIAGNOSIS — Z515 Encounter for palliative care: Secondary | ICD-10-CM | POA: Diagnosis not present

## 2023-03-12 DIAGNOSIS — R7989 Other specified abnormal findings of blood chemistry: Secondary | ICD-10-CM

## 2023-03-12 DIAGNOSIS — R4189 Other symptoms and signs involving cognitive functions and awareness: Secondary | ICD-10-CM

## 2023-03-12 DIAGNOSIS — G9340 Encephalopathy, unspecified: Secondary | ICD-10-CM

## 2023-03-12 DIAGNOSIS — I5033 Acute on chronic diastolic (congestive) heart failure: Secondary | ICD-10-CM

## 2023-03-12 DIAGNOSIS — I43 Cardiomyopathy in diseases classified elsewhere: Secondary | ICD-10-CM

## 2023-03-12 DIAGNOSIS — D0512 Intraductal carcinoma in situ of left breast: Secondary | ICD-10-CM

## 2023-03-12 DIAGNOSIS — I4891 Unspecified atrial fibrillation: Secondary | ICD-10-CM | POA: Diagnosis not present

## 2023-03-12 DIAGNOSIS — E119 Type 2 diabetes mellitus without complications: Secondary | ICD-10-CM

## 2023-03-12 LAB — GLUCOSE, CAPILLARY
Glucose-Capillary: 95 mg/dL (ref 70–99)
Glucose-Capillary: 134 mg/dL — ABNORMAL HIGH (ref 70–99)
Glucose-Capillary: 88 mg/dL (ref 70–99)
Glucose-Capillary: 106 mg/dL — ABNORMAL HIGH (ref 70–99)

## 2023-03-12 LAB — BASIC METABOLIC PANEL
Anion gap: 7 (ref 5–15)
BUN: 59 mg/dL — ABNORMAL HIGH (ref 8–23)
CO2: 22 mmol/L (ref 22–32)
Calcium: 8.8 mg/dL — ABNORMAL LOW (ref 8.9–10.3)
Chloride: 95 mmol/L — ABNORMAL LOW (ref 98–111)
Creatinine, Ser: 1.89 mg/dL — ABNORMAL HIGH (ref 0.44–1.00)
GFR, Estimated: 26 mL/min — ABNORMAL LOW (ref >60)
Glucose, Bld: 110 mg/dL — ABNORMAL HIGH (ref 70–99)
Potassium: 4 mmol/L (ref 3.5–5.1)
Sodium: 124 mmol/L — ABNORMAL LOW (ref 135–145)

## 2023-03-12 MED ORDER — SODIUM CHLORIDE 0.9 % IV BOLUS
1000.0000 mL | Freq: Once | INTRAVENOUS | Status: AC
  Administered 2023-03-12: 1000 mL via INTRAVENOUS

## 2023-03-12 MED ORDER — SODIUM CHLORIDE 0.9 % IV SOLN: INTRAVENOUS | Status: DC

## 2023-03-12 MED ORDER — CHLORHEXIDINE GLUCONATE CLOTH 2 % EX PADS: 6.0000 | MEDICATED_PAD | Freq: Every day | CUTANEOUS | Status: DC

## 2023-03-12 MED ORDER — DEXAMETHASONE SODIUM PHOSPHATE 4 MG/ML IJ SOLN
2.0000 mg | Freq: Two times a day (BID) | INTRAMUSCULAR | Status: DC
  Administered 2023-03-12 – 2023-03-15 (×7): 2 mg via INTRAVENOUS
  Filled 2023-03-12 (×7): qty 0.5

## 2023-03-12 NOTE — Progress Notes (Signed)
Patient has had hypotensive episode today. MD wanted to have 2nd PIV access. Patient had Rt. Mastectomy and Lt. Lumpectomy. Restrict band on Lt. Arm, but PIV access on Lt. AC. Patient's RN wasn't sure which arm is restricted. Clarified with Dr. Maylene Roes this matter and it was Lt. Arm restricted. Assessed Rt. Arm but no suitable veins. Informed Dr. Maylene Roes regarding this matter and suggested PICC line, but Dr. Maylene Roes wanted to hold off at this time. Explained patient's family member this matter. Informed Dr. Maylene Roes & patient's RN that keep a PIV access on Lt. Arm due to no PIV access on Rt. Arm. Night shift IV team will do second assess. HS [employer]

## 2023-03-12 NOTE — Care Management Important Message (Signed)
Important Message  Patient Details  Name: Amanda Green MRN: [id number] Date of Birth: [birth year]   Medicare Important Message Given:  Yes     Shelda Altes 03/12/2023, 9:58 AM

## 2023-03-12 NOTE — Consult Note (Signed)
Neurology Note   C/C: consultation sought, regarding the use of Eliquis, in the setting of altered mental status and findings of intracranial mass lesion  Active PL  Left occipital lobe enhancing mass lesion, likely metastasis 2/2 left breast ductal carcinoma Altered mental status, likely multifactorial etiology (chronic hypoxia, metabolic and toxic) History of atrial fibrillation with RVR, treated with Eliquis PTA History of cardiac amyloidosis  Neurological Exam  Mental Status: is lethargic, follows simple but not complex commands CN II-XII: Is suboptimal 2/2 AMS, but no gross lateralizing deficits Motor: the anti-gravity strength is preserved throughout without pathological stretch reflexes or long tract motor signs Sensory: WD X 4 Coordination: station and gait is N/T in the inpatient setting. There is no focal dysmetria or gross truncal ataxia  Objective Data   Brain MRI with and without contrast dated 02/14/2023 shows enhancing mass lesion, measuring approximately 3 X 3 X 3 cm, localized to the left occipital parasagittal region, associated with mild midline shift and the chronic cerebellar infarcts  Assessment  Left occipital lobe enhancing mass lesion, likely metastasis 2/2 left breast ductal carcinoma Altered mental status, likely multifactorial etiology (chronic hypoxia, metabolic and toxic) History of atrial fibrillation with RVR, treated with Eliquis PTA History of cardiac amyloidosis  Plan  Hold further use of Eliquis Continue present management with Decadron DVT prophylaxis Fall and aspiration precautions throughout the hospital stay Further coordination of care with oncology  Disposition, per admitting physician regarding the management of underlying medical issues and coordination of care among multiple providers  Thomasene Ripple, MD Neurohospitalist [facility] [phone]  *My notes are created using the Dragon dictation and unintentional typographical  errors could possibly pop-up

## 2023-03-12 NOTE — Progress Notes (Signed)
PROGRESS NOTE    [name]  [id number] DOB: 18-Sep-1935 DOA: 03/09/2023 PCP: Nicholas Lose, MD     Brief Narrative:  Amanda Green is a 88 y.o. female with medical history significant of breast cancer s/p right mastectomy in 199 and left lumpectomy in 2022 followed by left mastectomy in 2022 for DCIS and hx of mets to brain, T2DM, HTN, HLD, hx of CVA, memory loss, cardiac amyloid on tafamidis who presented to ED with complaints of shortness of breath and leg swelling.   In the ED, patient was found to be in A Fib RVR and evidence of CHF exacerbation. She was started on cardizem gtt but stopped due to low SBP. She was also given IV lasix for CHF.  Cardiology was consulted and patient was started on digoxin for rate control.  She remained with marginal blood pressures.  Mental status continued to decline.  Stat head CT was negative for acute findings.  Eliquis was held due to concern for brain bleed.  Due to patient's lack of improvement, palliative care was consulted.  New events last 24 hours / Subjective: Daughters are at bedside.  Patient was apparently up and ate grits this morning.  She is very fatigued, lethargic.  She is arousable to voice and is able to acknowledge me.  Her energy level has greatly diminished, she remains tachycardic, although rates are improved from yesterday.  Kidney function worsening.  Discussed with the daughters our concern for patient's functional status.  Dr. Rob Hickman office note from 3/15 notes that if patient were to grow stronger and performance status improves, they could consider systemic treatment.  However, if patient were to deteriorate, she recommended palliative care and comfort measures.  I discussed with the family that we are headed in the wrong direction, I worry about patient's frailty and illness and medical limitations in improving her quality of life.  They voiced understanding.  I recommend palliative care and hospice.  Assessment &  Plan:   Principal Problem:   Atrial fibrillation with RVR (HCC) Active Problems:   Acute on chronic diastolic CHF (congestive heart failure) (HCC)   Hyponatremia   Elevated troponin   Ductal carcinoma in situ (DCIS) of left breast   Cardiac amyloidosis (HCC)   Controlled type 2 diabetes mellitus without complication, without long-term current use of insulin (HCC)   Essential hypertension   Cognitive impairment   Leukocytosis   A Fib RVR -Followed by Dr. Sallyanne Kuster  -Started on cardizem gtt but stopped due to low SBP -Cardiology following, discussed with Dr. Margaretann Loveless  -Eliquis - discontinued due to concern for brain bleed in setting of worsening mentation and brain mets  -Digoxin held due to AKI   Acute on chronic diastolic CHF Cardiac amyloidosis  -Tafamidis -Lasix on hold in setting of AKI  -Strict I's and O's, daily weight, fluid restriction -Ted hose   AKI -Baseline Cr 0.9  -IVF   Acute metabolic encephalopathy -CT head without new/acute abnormality. Known left occipital mass   Demand ischemia -In setting of A Fib RVR and CHF   DCIS left breast with mets to brain -Continue Decadron -Per oncology and radiation oncology  Diabetes mellitus type 2 -Semglee, sliding scale insulin  Hypertension -Holding antihypertensives due to low blood pressure  Cognitive impairment -Namenda on hold   Goals of care -Patient with declining mental status, uncontrolled A Fib RVR with hypotension in setting of known metastatic breast cancer. Consult palliative care today.     In agreement with assessment  of the pressure ulcer as below:  Pressure Injury 03/09/23 Buttocks Right Stage 2 -  Partial thickness loss of dermis presenting as a shallow open injury with a red, pink wound bed without slough. less than 1cm size. pink/red (Active)  03/09/23 1854  Location: Buttocks  Location Orientation: Right  Staging: Stage 2 -  Partial thickness loss of dermis presenting as a shallow open  injury with a red, pink wound bed without slough.  Wound Description (Comments): less than 1cm size. pink/red  Present on Admission: Yes  Dressing Type Other (Comment) 03/11/23 2050         DVT prophylaxis:  Place TED hose Start: 03/12/23 1159 Place TED hose Start: 03/09/23 1723  Code Status: Full code Family Communication: Daughters at bedside  Disposition Plan:  Status is: Inpatient Remains inpatient appropriate because: A Fib RVR, hypotension, mentation decline   Consultants:  Cardiology  Palliative care   Procedures:  None   Antimicrobials:  Anti-infectives (From admission, onward)    None        Objective: Vitals:   03/12/23 0014 03/12/23 0652 03/12/23 0741 03/12/23 1126  BP: (!) 94/59 (!) 99/46 (!) 81/55 (!) 83/53  Pulse:  (!) 105 (!) 101 (!) 119  Resp: 18 17 20 20   Temp:  97.6 F (36.4 C) (!) 97.2 F (36.2 C) 97.6 F (36.4 C)  TempSrc:  Axillary Oral Oral  SpO2: 98% 95% 95% 95%  Weight:      Height:        Intake/Output Summary (Last 24 hours) at 03/12/2023 1349 Last data filed at 03/12/2023 0900 Gross per 24 hour  Intake 1000 ml  Output 2950 ml  Net -1950 ml    Filed Weights   03/09/23 1042 03/10/23 0456 03/11/23 0336  Weight: 69.7 kg 71.3 kg 74.3 kg    Examination: General exam: Appears fatigued, lethargic  Respiratory system: Clear to auscultation. Respiratory effort normal.  Cardiovascular system: S1 & S2 heard, tachycardic, irregular rhythm.  Gastrointestinal system: Abdomen is nondistended, soft  Central nervous system: Alert  Extremities: Symmetric in appearance  Skin: No rashes, lesions or ulcers on exposed skin   Data Reviewed: I have personally reviewed following labs and imaging studies  CBC: Recent Labs  Lab 03/06/23 2105 03/09/23 1204 03/09/23 1214 03/10/23 0233 03/11/23 0156  WBC 15.0* 12.3*  --  10.8* 10.3  NEUTROABS 13.7* 11.2*  --   --   --   HGB 15.0 14.0 14.3 13.2 14.0  HCT 46.0 42.9 42.0 38.3 40.0  MCV  90.7 89.9  --  84.5 83.2  PLT 247 149*  --  160 [id number]    Basic Metabolic Panel: Recent Labs  Lab 03/06/23 2243 03/09/23 1214 03/09/23 1402 03/09/23 1943 03/10/23 0233 03/11/23 0156 03/12/23 0231  NA 129* 127* 125*  --  127* 124* 124*  K 4.4 4.3 4.3  --  3.9 4.2 4.0  CL 95*  --  95*  --  98 95* 95*  CO2 24  --  23  --  22 22 22   GLUCOSE 224*  --  254*  --  241* 156* 110*  BUN 30*  --  26*  --  32* 45* 59*  CREATININE 0.91  --  0.89  --  0.90 1.36* 1.89*  CALCIUM 9.8  --  8.9  --  9.1 9.0 8.8*  MG  --   --   --  2.1  --  2.2  --     GFR: Estimated Creatinine Clearance:  20.6 mL/min (A) (by C-G formula based on SCr of 1.89 mg/dL (H)). Liver Function Tests: Recent Labs  Lab 03/06/23 2243 03/09/23 1402  AST 23 34  ALT 36 27  ALKPHOS 49 50  BILITOT 0.8 1.3*  PROT 5.3* 4.7*  ALBUMIN 3.0* 2.4*    Recent Labs  Lab 03/06/23 2243  LIPASE 47    No results for input(s): "AMMONIA" in the last 168 hours. Coagulation Profile: Recent Labs  Lab 03/09/23 1943  INR 1.7*    Cardiac Enzymes: No results for input(s): "CKTOTAL", "CKMB", "CKMBINDEX", "TROPONINI" in the last 168 hours. BNP (last 3 results) No results for input(s): "PROBNP" in the last 8760 hours. HbA1C: No results for input(s): "HGBA1C" in the last 72 hours. CBG: Recent Labs  Lab 03/11/23 1154 03/11/23 1624 03/11/23 2113 03/12/23 0737 03/12/23 1123  GLUCAP 159* 150* 123* 95 134*    Lipid Profile: No results for input(s): "CHOL", "HDL", "LDLCALC", "TRIG", "CHOLHDL", "LDLDIRECT" in the last 72 hours. Thyroid Function Tests: Recent Labs    03/09/23 1943  TSH 1.813    Anemia Panel: No results for input(s): "VITAMINB12", "FOLATE", "FERRITIN", "TIBC", "IRON", "RETICCTPCT" in the last 72 hours. Sepsis Labs: No results for input(s): "PROCALCITON", "LATICACIDVEN" in the last 168 hours.  Recent Results (from the past 240 hour(s))  Resp panel by RT-PCR (RSV, Flu A&B, Covid) Anterior Nasal Swab      Status: None   Collection Time: 03/09/23 11:34 AM   Specimen: Anterior Nasal Swab  Result Value Ref Range Status   SARS Coronavirus 2 by RT PCR NEGATIVE NEGATIVE Final   Influenza A by PCR NEGATIVE NEGATIVE Final   Influenza B by PCR NEGATIVE NEGATIVE Final    Comment: (NOTE) The Xpert Xpress SARS-CoV-2/FLU/RSV plus assay is intended as an aid in the diagnosis of influenza from Nasopharyngeal swab specimens and should not be used as a sole basis for treatment. Nasal washings and aspirates are unacceptable for Xpert Xpress SARS-CoV-2/FLU/RSV testing.  Fact Sheet for Patients: EntrepreneurPulse.com.au  Fact Sheet for Healthcare Providers: IncredibleEmployment.be  This test is not yet approved or cleared by the [place] FDA and has been authorized for detection and/or diagnosis of SARS-CoV-2 by FDA under an Emergency Use Authorization (EUA). This EUA will remain in effect (meaning this test can be used) for the duration of the COVID-19 declaration under Section 564(b)(1) of the Act, 21 U.S.C. section 360bbb-3(b)(1), unless the authorization is terminated or revoked.     Resp Syncytial Virus by PCR NEGATIVE NEGATIVE Final    Comment: (NOTE) Fact Sheet for Patients: EntrepreneurPulse.com.au  Fact Sheet for Healthcare Providers: IncredibleEmployment.be  This test is not yet approved or cleared by the [place] FDA and has been authorized for detection and/or diagnosis of SARS-CoV-2 by FDA under an Emergency Use Authorization (EUA). This EUA will remain in effect (meaning this test can be used) for the duration of the COVID-19 declaration under Section 564(b)(1) of the Act, 21 U.S.C. section 360bbb-3(b)(1), unless the authorization is terminated or revoked.  Performed at [facility], [place] [address], [place], [place] [address]       Radiology Studies: CT HEAD WO CONTRAST (5MM)  Result  Date: 03/11/2023 CLINICAL DATA:  Mental status change, unknown cause rule out brain bleed given known brain mets and altered mental status EXAM: CT HEAD WITHOUT CONTRAST TECHNIQUE: Contiguous axial images were obtained from the base of the skull through the vertex without intravenous contrast. RADIATION DOSE REDUCTION: This exam was performed according to the  departmental dose-optimization program which includes automated exposure control, adjustment of the mA and/or kV according to patient size and/or use of iterative reconstruction technique. COMPARISON:  MRI head February 29, 24. FINDINGS: Brain: Left occipital mass, compatible with known metastasis. No evidence of acute hemorrhage, significant midline shift, hydrocephalus. Patchy white matter hypodensities, nonspecific but compatible with chronic microvascular ischemic disease. Redemonstrated nodular gray matter heterotopia, better characterized on prior MRI. Vascular: Calcific atherosclerosis. Skull: No acute fracture. Sinuses/Orbits: Clear sinuses.  No acute orbital findings. Other: No mastoid effusions. IMPRESSION: 1. Left occipital mass, compatible with known metastasis. MRI with contrast could better characterize. 2. Redemonstrated nodular gray matter heterotopia, better characterized on prior MRI. 3. No evidence of new/interval acute abnormality. Electronically Signed   By: Margaretha Sheffield M.D.   On: 03/11/2023 11:51      Scheduled Meds:  brimonidine  1 drop Left Eye BID   And   timolol  1 drop Left Eye BID   dexamethasone (DECADRON) injection  2 mg Intravenous Q12H   dorzolamide  1 drop Left Eye BID   insulin aspart  0-9 Units Subcutaneous TID WC   insulin glargine-yfgn  10 Units Subcutaneous Daily   ofloxacin  1 drop Right Eye QID   prednisoLONE acetate  1 drop Right Eye QID   Tafamidis  61 mg Oral Daily   Continuous Infusions:  sodium chloride 100 mL/hr at 03/12/23 1030     LOS: 3 days   Time spent: 35 minutes   Dessa Phi,  DO [facility] 03/12/2023, 1:49 PM   Available via Epic secure chat 7am-7pm After these hours, please refer to coverage provider listed on [email or web address]

## 2023-03-12 NOTE — Progress Notes (Addendum)
Rounding Note    Patient Name: Amanda Green Date of Encounter: 03/12/2023  [place] Cardiologist: Sanda Klein, MD   Subjective   No acute overnight events. She remains in atrial fibrillation this morning but rates better (mostly in the 100s to 110s). She is still very somnolent but has no complaints this morning. She denies any chest pain, shortness of breath, palpitations, or lightheadedness.   Inpatient Medications    Scheduled Meds:  atorvastatin  80 mg Oral Daily   brimonidine  1 drop Left Eye BID   And   timolol  1 drop Left Eye BID   dexamethasone  2 mg Oral Q12H   dorzolamide  1 drop Left Eye BID   insulin aspart  0-9 Units Subcutaneous TID WC   insulin glargine-yfgn  10 Units Subcutaneous Daily   memantine  10 mg Oral BID   ofloxacin  1 drop Right Eye QID   prednisoLONE acetate  1 drop Right Eye QID   Tafamidis  61 mg Oral Daily   Continuous Infusions:  sodium chloride     PRN Meds: acetaminophen **OR** acetaminophen   Vital Signs    Vitals:   03/11/23 2237 03/12/23 0014 03/12/23 0652 03/12/23 0741  BP: (!) 98/47 (!) 94/59 (!) 99/46 (!) 81/55  Pulse: (!) 117  (!) 105 (!) 101  Resp:  18 17 20   Temp:   97.6 F (36.4 C) (!) 97.2 F (36.2 C)  TempSrc:   Axillary Oral  SpO2: 94% 98% 95% 95%  Weight:      Height:        Intake/Output Summary (Last 24 hours) at 03/12/2023 0849 Last data filed at 03/12/2023 0014 Gross per 24 hour  Intake 1000 ml  Output 2051 ml  Net -1051 ml      03/11/2023    3:36 AM 03/10/2023    4:56 AM 03/09/2023   10:42 AM  Last 3 Weights  Weight (lbs) 163 lb 12.8 oz 157 lb 3 oz 153 lb 10.6 oz  Weight (kg) 74.3 kg 71.3 kg 69.7 kg      Telemetry    Atrial fibrillation with rates in the 100s to 120s. PVCs noted. - Personally Reviewed  ECG    No new ECG since 03/09/2023. - Personally Reviewed  Physical Exam   GEN: Ill appearing African-American female. Somnolent but no acute distress.  Neck: No  JVD. Cardiac: Tachycardic with irregularly irregular rhythm. No murmurs, rubs, or gallops. Radial pulses 2+ and equal bilaterally. Respiratory: No increased work of breathing. Clear to auscultation anteriorly. GI: Soft, non-distended, and non-tender.   MS: Mild (trace to 1+) edema of bilaterally lower extremities. No deformity. Skin: Warm and dry. Neuro:  No focal deficits.  Psych: Somnolent.   Labs    High Sensitivity Troponin:   Recent Labs  Lab 03/09/23 1204 03/09/23 1402  TROPONINIHS 72* 51*     Chemistry Recent Labs  Lab 03/06/23 2243 03/09/23 1214 03/09/23 1402 03/09/23 1943 03/10/23 0233 03/11/23 0156 03/12/23 0231  NA 129*   < > 125*  --  127* 124* 124*  K 4.4   < > 4.3  --  3.9 4.2 4.0  CL 95*  --  95*  --  98 95* 95*  CO2 24  --  23  --  22 22 22   GLUCOSE 224*  --  254*  --  241* 156* 110*  BUN 30*  --  26*  --  32* 45* 59*  CREATININE 0.91  --  0.89  --  0.90 1.36* 1.89*  CALCIUM 9.8  --  8.9  --  9.1 9.0 8.8*  MG  --   --   --  2.1  --  2.2  --   PROT 5.3*  --  4.7*  --   --   --   --   ALBUMIN 3.0*  --  2.4*  --   --   --   --   AST 23  --  34  --   --   --   --   ALT 36  --  27  --   --   --   --   ALKPHOS 49  --  50  --   --   --   --   BILITOT 0.8  --  1.3*  --   --   --   --   GFRNONAA >60  --  >60  --  >60 38* 26*  ANIONGAP 10  --  7  --  7 7 7    < > = values in this interval not displayed.    Lipids No results for input(s): "CHOL", "TRIG", "HDL", "LABVLDL", "LDLCALC", "CHOLHDL" in the last 168 hours.  Hematology Recent Labs  Lab 03/09/23 1204 03/09/23 1214 03/10/23 0233 03/11/23 0156  WBC 12.3*  --  10.8* 10.3  RBC 4.77  --  4.53 4.81  HGB 14.0 14.3 13.2 14.0  HCT 42.9 42.0 38.3 40.0  MCV 89.9  --  84.5 83.2  MCH 29.4  --  29.1 29.1  MCHC 32.6  --  34.5 35.0  RDW 17.3*  --  16.9* 16.7*  PLT 149*  --  160 172   Thyroid  Recent Labs  Lab 03/09/23 1943  TSH 1.813    BNP Recent Labs  Lab 03/07/23 0057 03/09/23 1204  BNP 87.0  660.9*    DDimer No results for input(s): "DDIMER" in the last 168 hours.   Radiology    CT HEAD WO CONTRAST (5MM)  Result Date: 03/11/2023 CLINICAL DATA:  Mental status change, unknown cause rule out brain bleed given known brain mets and altered mental status EXAM: CT HEAD WITHOUT CONTRAST TECHNIQUE: Contiguous axial images were obtained from the base of the skull through the vertex without intravenous contrast. RADIATION DOSE REDUCTION: This exam was performed according to the departmental dose-optimization program which includes automated exposure control, adjustment of the mA and/or kV according to patient size and/or use of iterative reconstruction technique. COMPARISON:  MRI head February 29, 24. FINDINGS: Brain: Left occipital mass, compatible with known metastasis. No evidence of acute hemorrhage, significant midline shift, hydrocephalus. Patchy white matter hypodensities, nonspecific but compatible with chronic microvascular ischemic disease. Redemonstrated nodular gray matter heterotopia, better characterized on prior MRI. Vascular: Calcific atherosclerosis. Skull: No acute fracture. Sinuses/Orbits: Clear sinuses.  No acute orbital findings. Other: No mastoid effusions. IMPRESSION: 1. Left occipital mass, compatible with known metastasis. MRI with contrast could better characterize. 2. Redemonstrated nodular gray matter heterotopia, better characterized on prior MRI. 3. No evidence of new/interval acute abnormality. Electronically Signed   By: Margaretha Sheffield M.D.   On: 03/11/2023 11:51    Cardiac Studies   Echocardiogram 04/15/2021: Impressions: 1. Consider infiltrative process such as amyloidosis due to severe LV  wall thickening. Left ventricular ejection fraction, by estimation, is  >75%. The left ventricle has hyperdynamic function. The left ventricle has  no regional wall motion  abnormalities. There is severe left ventricular hypertrophy. Left  ventricular diastolic  parameters  are consistent with Grade I diastolic  dysfunction (impaired relaxation). Elevated left ventricular end-diastolic  pressure.   2. Right ventricular systolic function is normal. The right ventricular  size is normal. There is normal pulmonary artery systolic pressure. The  estimated right ventricular systolic pressure is 0000000 mmHg.   3. Left atrial size was mildly dilated.   4. The pericardial effusion is posterior to the left ventricle. Trivial  pericardial effusion.   5. The mitral valve is abnormal. Trivial mitral valve regurgitation.   6. The aortic valve is tricuspid. Aortic valve regurgitation is not  visualized. Mild aortic valve sclerosis is present, with no evidence of  aortic valve stenosis.   7. The inferior vena cava is normal in size with greater than 50%  respiratory variability, suggesting right atrial pressure of 3 mmHg.   Comparison(s): Unable to locate echo for comparison.  _______________   PYP Scan 12/14/2021:   By semi-quantitative assessment scan is consistent with heart uptake equal to rib uptake-Grade 2. Heart to contralateral lung ratio is between 1-1.5, indeterminate for amyloid.   Study is strongly suggestive of TTR amyloidosis (visual score of 2-3/ratio >1.5).   Prior study not available for comparison.   Overall cardiac uptake is similar to rib, suggestive of TTR amyloidosis.  Patient Profile     88 y.o. female with a history of cardiac amyloidosis on Tafamis, paroxysmal atrial fibrillation on Eliquis, hypertension, hyperlipidemia, type 2 diabetes mellitus, prior CVA, memory loss, and breast cancer s/p right mastectomy in 1999 and left mastectomy in 2022 now with likely metastasis to the brain based on brain MRI in 01/2023 who was admitted on 03/09/2023 for atrial fibrillation with RVR after presenting with increased shortness of breath and swelling.   Assessment & Plan    Atrial Fibrillation with RVR Patient has a history of paroxysmal atrial fibrillation.  She presented with increased shortness of breath and swelling for the past week and was found to be in rapid atrial fibrillation with rates in the 130s. Electrolytes and TSH within normal limits. She was initially treated with IV Cardizem but this had to be stopped due to soft BP and she was started on Digoxin. - Still in atrial fibrillation this morning but rates better - mostly in the 100s to 110s. - Echo has been ordered but not done yet due to elevated heart rate.  - Will stop Digoxin given worsening renal function. - Will discuss possibly starting Amiodarone. Not ideal given risk of chemical conversion while off anticoagulation but limited options at this point.  - Continue to hold Diltiazem given hypotension. - Also has Lopressor 12.5mg  every 8 hours as needed ordered.  However, BP too low for this right now. - On chronic anticoagulation with Eliquis at home. However, this has been stopped due to brain metastasis with associated regional mass effect with up to a 57mm left to right shift and concern for CNS bleed. Head CT yesterday showed no evidence of hemorrhage. However, I personally discussed with Neurology who recommended remaining off anticoagulation due to "significant risk of hemorrhage with large intracranial metastasis."   Cardiac Amyloidosis  Acute on Chronic Diastolic CHF Echo in [id number] showed LVEF >75% with severe LVH and grade 1 diastolic dysfunction. PYP scan in 11/2021 was strongly suggestive of TTR amyloidosis. Myeloma panel was negative. She presented with increased shortness of breath and swelling. BNP elevated at 660. Chest x-ray showed prominent cardiac silhouette with small effusion. Chest CTA was negative for PE. She received one dose  of IV Lasix yesterday. No documented urinary I/Os yesterday.  - Mild edema on exam but overall appears euvolemic.  - Continue Tafamidis 61mg  daily.  - Home Lisinopril and HCTZ on hold due to soft BP and worsening renal function. - Continue to  monitor volume status closely. Will order compressions stockings to help with mild lower extremity edema.   Demand Ischemia High-sensitivity troponin mildly elevated and flat at 72 >> 51 not consistent with ACS. - No chest pain.  - Echo pending. - Troponin trend consistent with demand ischemic from rapid atrial fibrillation and CHF. No plans for ischemic evaluation at this time.   Hypotensive History of Hypertension History of hypertension but have struggled more with hypotension this admission. BP has low as 79/56 on 3/25.  - BP soft but stable this morning. Systolic BP in the [id number] to 90s. - Continue to hold home Lisinopril, HCTZ, and Diltiazem.   Hyperlipidemia - Continue Lipitor 80mg  daily.   AKI Creatinine has been trending up the last few days: 0.90 >> 1.36 >> 1.89. Suspect this is due to poor oral intake and hypotension. - Continue to monitor closely. - Management per primary team.  May need additional fluids.   Altered Mental Status Lethargy Patient has been more lethargic and somnolent recently. She has been oriented to person and placed only. Head CT on 03/11/2024 showed a left occipital mass compatible with known metastasis but no evidence of acute hemorrhage, significant midline shift, hydrocephalus.  - Palliative Care has been consulted to help discuss goal of care given given breast cancer with mets to the brains.   Otherwise, per primary team: - Hyponatremia: Na 124 today - Type 2 diabetes mellitus: Hemoglobin A1c 6.5% in 01/2023 - Memory loss - Breast cancer with metastasis to the brain  For questions or updates, please contact [place] Please consult [email or web address] for contact info under        Signed, Eppie Gibson  03/12/2023, 8:49 AM    Patient seen and examined with Sande Rives PA-C.  Agree as above, with the following exceptions and changes as noted below. Rates better controlled today, patient very somnolent. CV rates elevated, no  murmurs, lungs coarse throughout, somnolent, responds after stimulation. All available labs, radiology testing, previous records reviewed. Recommend palliative care consult, discussed with primary service. Digoxin held with impaired renal function. Discussed with family. Unclear what current goals of care are, we are significantly limited by hypotension to treat atrial fibrillation.  Elouise Munroe, MD 03/12/23 11:52 AM

## 2023-03-12 NOTE — Consult Note (Signed)
Palliative Care Consult Note                                  Date: 03/12/2023   Patient Name: Amanda Green  DOB: 28-Apr-1935  MRN: [id number]  Age / Sex: 88 y.o., female  PCP: Nicholas Lose, MD Referring Physician: Dessa Phi, DO  Reason for Consultation: Establishing goals of care  HPI/Patient Profile: 88 y.o. female  with past medical history of breast cancer s/p right mastectomy in 199 and left lumpectomy in 2022 followed by left mastectomy in 2022 for DCIS and hx of mets to brain, T2DM, HTN, HLD, hx of CVA, memory loss, cardiac amyloid on tafamidis who presented to ED with complaints of shortness of breath and leg swelling. She was admitted on 03/09/2023 with A-fib RVR, acute on chronic diastolic CHF, AKI, acute metabolic encephalopathy, and others.   PMT was consulted for [place] conversations.  Past Medical History:  Diagnosis Date   Arthritis    knees,   Breast cancer ([place]) 1999   right mastectomy   Breast cancer, left ([place]) 05/2019   left lumpectomy   Diabetes mellitus    Dislocation of left shoulder joint    Family history of prostate cancer    Hyperlipidemia    Hypertension    Memory loss    Numbness and tingling of right lower extremity    Paresthesia    Stroke ([place])    light stroke - 2012 ,right leg nerve damage     Subjective:   This NP [name] reviewed medical records, received report from team, assessed the patient and then meet at the patient's bedside to discuss diagnosis, prognosis, GOC, EOL wishes disposition and options.  I met with the patient at the bedside, although she is sleeping and not very interactive.  Her daughter Amanda Green was present as well as her niece Amanda Green.   Concept of Palliative Care was introduced as specialized medical care for people and their families living with serious illness.  If focuses on providing relief from the symptoms and stress of a serious illness.  The goal is to  improve quality of life for both the patient and the family. Values and goals of care important to patient and family were attempted to be elicited.  Created space and opportunity for patient  and family to explore thoughts and feelings regarding current medical situation   Natural trajectory and current clinical status were discussed. Questions and concerns addressed. Patient  encouraged to call with questions or concerns.    Patient/Family Understanding of Illness: They understand that she cannot give heart rate medications due to her blood pressure.  She has a follow-up appointment on 02/22/2023 with oncology.  Radiation oncology after last treatments that they will call her in 3 weeks to plan for 21-month MRI.  They stated that she started having worsening confusion swallowing last Wednesday and they took her to [facility] who noted nothing wrong and sent her back home.  Because of progressive symptoms he presented to the emergency room, as per above.  We had significant discussion about her major medical problems including A-fib with RVR, hypotension, acute on chronic CHF, AKI, and increasing somnolence in the setting of known brain mass.  We discussed limitations of treatments such as limited cardiac medication availability due to hypotension, limited in diuresis ability due to AKI, etc.  Life Review: The patient has 5 children, although to  have passed and 3 remain alive.  These include 3 daughters Amanda Green, and Amanda Green.  They are all active and involved in the patient's life.  She also has "plenty" of grandchildren, great-grandchildren, and great great grandchildren.  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  She is a person of faith and attends church every week.  She enjoys cooking and traveling including a trip to [place] last year with her daughter.  Patient Values: Faith, family  Goals: Full code, full scope  Today's  Discussion: In addition to discussion described above we had significant discussion of various topics.  We discussed her cancer journey.  She last saw oncology on 03/01/2023.  I shared that they recommended if she had further decline to consider palliative care and hospice.  She has a known brain lesion for which she finished radiation oncology with last treatment 03/06/2023 (and as per above plan for 7-month MRI).  Her daughter Amanda Green was present at the last visit via telephone.  Her daughter states that she was more awake this morning but has been sleeping more ever since she arrived.  I shared my concerns with increasing somnolence with known brain mass.  We again discussed alterations of medications, including stopping Eliquis due to risk of brain mass bleed.  We discussed CODE STATUS.  Given intact context of advancing age and serious illness we explored risks and benefits of cardiopulmonary resuscitation as well as statistical chance of survival.  Amanda Green states that the patient noted on admission that she would want to be resuscitated and she would wish to respect that.  She request the patient to remain a full code.  We also discussed, as we often do with confused or more somnolent patients, the need to consider the future question of if she is unable to eat and drink for herself when she ever want a feeding tube.  Amanda Green notes that she is being chewing and swallowing appropriately during her admission.  She did not eat lunch today but did okay otherwise.  I indicated no need for an absolute decision but these were all questions that should be considered for more long-term planning.  I discussed that we would be available for further goals of care discussions pending clinical progress.  We discussed that if the patient has significant further decline we would likely need more discussions about how to approach.  He verbalized understanding.  I provided a copy of "hard choices for loving people" to  assist in her deliberations as a family.  I provided a contact card with our phone number for any follow-up needed.  I provided emotional and general support through therapeutic listening, empathy, sharing of stories, and other techniques. I answered all questions and addressed all concerns to the best of my ability.  Review of Systems  Unable to perform ROS   Objective:   Primary Diagnoses: Present on Admission:  Atrial fibrillation with RVR ([place])  Cognitive impairment  Essential hypertension  Cardiac amyloidosis (HCC)  Ductal carcinoma in situ (DCIS) of left breast   Physical Exam Vitals and nursing note reviewed.  Constitutional:      General: She is sleeping. She is not in acute distress. HENT:     Head: Normocephalic and atraumatic.  Cardiovascular:     Rate and Rhythm: Normal rate.  Pulmonary:     Effort: Pulmonary effort is normal. No respiratory distress.  Abdominal:     General: Abdomen is flat.  Palpations: Abdomen is soft.  Skin:    General: Skin is warm and dry.     Vital Signs:  BP (!) 83/53 (BP Location: Right Wrist)   Pulse (!) 119   Temp 97.6 F (36.4 C) (Oral)   Resp 20   Ht 5\' 3"  (1.6 m)   Wt 74.3 kg   SpO2 95%   BMI 29.02 kg/m   Palliative Assessment/Data: 20-30%    Advanced Care Planning:   Existing Vynca/ACP Documentation: None  Primary Decision Maker: NEXT OF KIN  Code Status/Advance Care Planning: Full code  A discussion was had today regarding advanced directives. Concepts specific to code status, artifical feeding and hydration, continued IV antibiotics and rehospitalization was had.  The difference between a aggressive medical intervention path and a palliative comfort care path for this patient at this time was had.   Decisions/Changes to ACP: None today  Assessment & Plan:   Impression: 88 year old female with chronic comorbidities and acute presentations as described above.  The major concern today is limitation  and available treatment options given hypotension, AKI, brain mass.  We discussed the known metastatic breast cancer including brain mass as previously documented.  We discussed concern for increasing somnolence especially given her brain tumor.  Family seems to have a firm understanding on her medical situation.  Per previous discussions with the patient, family would like her to remain a full code and full scope.  Overall prognosis is poor.  SUMMARY OF RECOMMENDATIONS   Remain full code Full scope of care Ongoing [place] conversations pending evolution of clinical picture If significant decline would have further discussions about how to proceed PMT will continue to follow  Symptom Management:  Per primary team PMT is available to assist as needed  Prognosis:  Unable to determine  Discharge Planning:  To Be Determined   Discussed with: Patient's family, medical team, nursing team    Thank you for allowing [place] to participate in the care of [name] PMT will continue to support holistically.  Time Total: 80 min  Greater than 50%  of this time was spent counseling and coordinating care related to the above assessment and plan.  Signed by: [name], NP Palliative Medicine Team  Team Phone # [phone] (Nights/Weekends)  03/12/2023, 2:45 PM

## 2023-03-13 ENCOUNTER — Inpatient Hospital Stay (HOSPITAL_COMMUNITY): Payer: Medicare PPO

## 2023-03-13 DIAGNOSIS — D72829 Elevated white blood cell count, unspecified: Secondary | ICD-10-CM

## 2023-03-13 DIAGNOSIS — I10 Essential (primary) hypertension: Secondary | ICD-10-CM | POA: Diagnosis not present

## 2023-03-13 DIAGNOSIS — Z7189 Other specified counseling: Secondary | ICD-10-CM | POA: Diagnosis not present

## 2023-03-13 DIAGNOSIS — I5033 Acute on chronic diastolic (congestive) heart failure: Secondary | ICD-10-CM | POA: Diagnosis not present

## 2023-03-13 DIAGNOSIS — I4891 Unspecified atrial fibrillation: Secondary | ICD-10-CM | POA: Diagnosis not present

## 2023-03-13 DIAGNOSIS — Z515 Encounter for palliative care: Secondary | ICD-10-CM | POA: Diagnosis not present

## 2023-03-13 DIAGNOSIS — G9340 Encephalopathy, unspecified: Secondary | ICD-10-CM | POA: Diagnosis not present

## 2023-03-13 DIAGNOSIS — E871 Hypo-osmolality and hyponatremia: Secondary | ICD-10-CM

## 2023-03-13 LAB — GLUCOSE, CAPILLARY
Glucose-Capillary: 64 mg/dL — ABNORMAL LOW (ref 70–99)
Glucose-Capillary: 112 mg/dL — ABNORMAL HIGH (ref 70–99)
Glucose-Capillary: 161 mg/dL — ABNORMAL HIGH (ref 70–99)
Glucose-Capillary: 201 mg/dL — ABNORMAL HIGH (ref 70–99)
Glucose-Capillary: 149 mg/dL — ABNORMAL HIGH (ref 70–99)

## 2023-03-13 LAB — BASIC METABOLIC PANEL
Anion gap: 8 (ref 5–15)
BUN: 43 mg/dL — ABNORMAL HIGH (ref 8–23)
CO2: 24 mmol/L (ref 22–32)
Calcium: 8.6 mg/dL — ABNORMAL LOW (ref 8.9–10.3)
Chloride: 105 mmol/L (ref 98–111)
Creatinine, Ser: 1 mg/dL (ref 0.44–1.00)
GFR, Estimated: 55 mL/min — ABNORMAL LOW (ref >60)
Glucose, Bld: 73 mg/dL (ref 70–99)
Potassium: 3.5 mmol/L (ref 3.5–5.1)
Sodium: 137 mmol/L (ref 135–145)

## 2023-03-13 LAB — CBC
HCT: 34.1 % — ABNORMAL LOW (ref 36.0–46.0)
Hemoglobin: 12.2 g/dL (ref 12.0–15.0)
MCH: 29.8 pg (ref 26.0–34.0)
MCHC: 35.8 g/dL (ref 30.0–36.0)
MCV: 83.2 fL (ref 80.0–100.0)
Platelets: 116 10*3/uL — ABNORMAL LOW (ref 150–400)
RBC: 4.1 MIL/uL (ref 3.87–5.11)
RDW: 16.8 % — ABNORMAL HIGH (ref 11.5–15.5)
WBC: 7.5 10*3/uL (ref 4.0–10.5)
nRBC: 0 % (ref 0.0–0.2)

## 2023-03-13 LAB — MAGNESIUM: Magnesium: 2.1 mg/dL (ref 1.7–2.4)

## 2023-03-13 MED ORDER — POTASSIUM CHLORIDE 20 MEQ PO PACK
40.0000 meq | PACK | Freq: Once | ORAL | Status: AC
  Administered 2023-03-13: 40 meq via ORAL
  Filled 2023-03-13: qty 2

## 2023-03-13 MED ORDER — ORAL CARE MOUTH RINSE: 15.0000 mL | OROMUCOSAL | Status: DC | PRN

## 2023-03-13 MED ORDER — METOPROLOL TARTRATE 12.5 MG HALF TABLET
12.5000 mg | ORAL_TABLET | Freq: Two times a day (BID) | ORAL | Status: DC
  Administered 2023-03-13 (×2): 12.5 mg via ORAL
  Filled 2023-03-13 (×2): qty 1

## 2023-03-13 MED ORDER — CHLORHEXIDINE GLUCONATE CLOTH 2 % EX PADS
6.0000 | MEDICATED_PAD | Freq: Every day | CUTANEOUS | Status: DC
  Administered 2023-03-12 – 2023-03-17 (×6): 6 via TOPICAL

## 2023-03-13 NOTE — Progress Notes (Signed)
  Transition of Care [facility]) Screening Note   Patient Details  Name: Amanda Green Date of Birth: May 07, 1935   Transition of Care [facility]) CM/SW Contact:    Pollie Friar, RN Phone Number: 03/13/2023, 2:47 PM   Pt is from home with family. Per palliative care family wants to proceed with treatments.  Transition of Care Department [facility]) has reviewed patient. We will continue to monitor patient advancement through interdisciplinary progression rounds. If new patient transition needs arise, please place a TOC consult.

## 2023-03-13 NOTE — Progress Notes (Signed)
  Daily Progress Note   Patient Name: Amanda Green       Date: 03/13/2023 DOB: 1935-01-20  Age: 88 y.o. MRN#: [id number] Attending Physician: Mercy Riding, MD Primary Care Physician: Nicholas Lose, MD Admit Date: 03/09/2023 Length of Stay: 4 days  Reason for Consultation/Follow-up: {Reason for Consult:23484}  HPI/Patient Profile:  ***  Subjective:   Subjective: Chart Reviewed. Updates received. Patient Assessed. Created space and opportunity for patient  and family to explore thoughts and feelings regarding current medical situation.  Today's Discussion: ***  Review of Systems  Objective:   Vital Signs:  BP 124/68 (BP Location: Right Arm)   Pulse (!) 120   Temp 97.8 F (36.6 C) (Oral)   Resp 18   Ht 5\' 3"  (1.6 m)   Wt 71.2 kg   SpO2 98%   BMI 27.81 kg/m   Physical Exam: Physical Exam  Palliative Assessment/Data: ***    Existing Vynca/ACP Documentation: ***  Assessment & Plan:   Impression: Present on Admission:  Atrial fibrillation with RVR (HCC)  Cognitive impairment  Essential hypertension  Cardiac amyloidosis (HCC)  Ductal carcinoma in situ (DCIS) of left breast  ***  SUMMARY OF RECOMMENDATIONS   ***  Symptom Management:  ***  Code Status: {Palliative Code status:23503}  Prognosis: {Palliative Care Prognosis:23504}  Discharge Planning: {Palliative dispostion:23505}  Discussed with: ***  Thank you for allowing [place] to participate in the care of Amanda Green PMT will continue to support holistically.  Time Total: ***  Visit consisted of counseling and education dealing with the complex and emotionally intense issues of symptom management and palliative care in the setting of serious and potentially life-threatening illness. Greater than 50%  of this time was spent counseling and coordinating care related to the above assessment and plan.  [name], NP Palliative Medicine Team  Team Phone # [phone] (Nights/Weekends)   08/15/2021, 8:17 AM

## 2023-03-13 NOTE — Progress Notes (Signed)
   03/13/23 0404  Assess: MEWS Score  Temp 97.9 F (36.6 C)  BP 100/63  MAP (mmHg) 71  ECG Heart Rate (!) 112  Resp 16  Level of Consciousness Responds to Voice  SpO2 95 %  O2 Device Room Air  Assess: MEWS Score  MEWS Temp 0  MEWS Systolic 1  MEWS Pulse 2  MEWS RR 0  MEWS LOC 1  MEWS Score 4  MEWS Score Color Red  Assess: if the MEWS score is Yellow or Red  Were vital signs taken at a resting state? Yes  Focused Assessment No change from prior assessment  Does the patient meet 2 or more of the SIRS criteria? No  Does the patient have a confirmed or suspected source of infection? No  Provider and Rapid Response Notified? No  MEWS guidelines implemented  No, previously red, continue vital signs every 4 hours (previously red/yellow, no changes, HR fluctuates 100-130's AFIB)  Assess: SIRS CRITERIA  SIRS Temperature  0  SIRS Pulse 1  SIRS Respirations  0  SIRS WBC 0  SIRS Score Sum  1

## 2023-03-13 NOTE — Progress Notes (Signed)
PROGRESS NOTE  Amanda Green [id number] DOB: 1935-09-24   PCP: Nicholas Lose, MD  Patient is from: Home  DOA: 03/09/2023 LOS: 4  Chief complaints Chief Complaint  Patient presents with   Leg Swelling     Brief Narrative / Interim history: 88 year old F with PMH of BRCA with brain mets s/p bilateral mastectomy, DM-2, dementia, HTN, CVA and cardiac amyloidosis presenting with shortness of breath and edema and admitted for A-fib with RVR, acute diastolic CHF and AKI.  Patient was started on IV Lasix for CHF.  Cardiology consulted and started on digoxin for rate control due to marginal blood pressures.  However, digoxin held due to AKI.  Diuretics held due to soft blood pressures.  Patient continued to decline despite all interventions.  Continued to be fatigued and lethargic.  Palliative medicine consulted and following.  Hospital course complicated by acute urinary retention requiring Foley catheter.   Subjective: Seen and examined earlier this morning.  Family members at bedside.  Concerned about hematuria, blood pressure, heart rate and extremity edema.  Patient in sleeping quietly.  Does not appear to be in distress.  Wakes to voice.  Has no complaints.  Oriented to self, family and place but not time.  Objective: Vitals:   03/13/23 0416 03/13/23 0630 03/13/23 0801 03/13/23 1203  BP:   115/70 124/68  Pulse: (!) 102  (!) 104 (!) 120  Resp:   16 18  Temp:   97.8 F (36.6 C)   TempSrc:   Oral   SpO2: 97%  96% 98%  Weight:  71.2 kg    Height:        Examination:  GENERAL: No apparent distress.  Nontoxic. HEENT: MMM.  Vision and hearing grossly intact.  NECK: Supple.  No apparent JVD.  RESP:  No IWOB.  Fair aeration bilaterally. CVS:  RRR. Heart sounds normal.  ABD/GI/GU: BS+. Abd soft, NTND.  MSK/EXT:  Moves extremities. No apparent deformity.  Trace BLE edema SKIN: no apparent skin lesion or wound NEURO: Sleepy but wakes to voice.  Oriented to self, place and  person.  Follows commands.  No apparent focal neuro deficit. PSYCH: Calm. Normal affect.   Procedures:  None  Microbiology summarized: [id number], influenza and RSV PCR nonreactive.  Assessment and plan: Principal Problem:   Atrial fibrillation with RVR (HCC) Active Problems:   Acute on chronic diastolic CHF (congestive heart failure) (HCC)   Hyponatremia   Elevated troponin   Ductal carcinoma in situ (DCIS) of left breast   Cardiac amyloidosis (HCC)   Controlled type 2 diabetes mellitus without complication, without long-term current use of insulin (HCC)   Essential hypertension   Cognitive impairment   Leukocytosis   Encephalopathy acute  A-fib with RVR: Initially started on Cardizem drip that was discontinued due to hypotension.  Eliquis discontinued due to concern for brain bleed in the setting of worsening mentation and brain mets.  Digoxin held due to AKI.  HR ranges from 70s to 120s.  Hypotension improved.  Cardiology recommended palliative care consult. -Palliative medicine following. -IV metoprolol as needed -Optimize electrolytes   Acute on chronic diastolic CHF: Exam with some generalized edema.  Does not seems to have respiratory distress. Cardiac amyloidosis  -Continue tafamidis -Discontinue IV fluid -Diuretics on hold due to AKI and hypotension. -Strict I's and O's, daily weight, fluid restriction -Ted hose    AKI: Likely prerenal from hypotension and diuretics.  Resolved. Recent Labs    02/13/23 2205 02/23/23 2225 03/01/23 1004 03/06/23 2243  03/09/23 1402 03/10/23 0233 03/11/23 0156 03/12/23 0231 03/13/23 0232  BUN 12 49* 31* 30* 26* 32* 45* 59* 43*  CREATININE 0.92 1.20* 0.83 0.91 0.89 0.90 1.36* 1.89* 1.00  -Discontinue IV fluid -Continue holding nephrotoxic -Check renal ultrasound  Acute urinary retention/hematuria: Seems like she has significant urinary tension with bladder over distention the night of 3/26.  Hematuria likely traumatic from I  and O cath. -Continue Foley catheter -Renal ultrasound   Acute metabolic encephalopathy/underlying dementia: Sleepy but wakes to voice.  Oriented to self, place and family.  CT head without acute finding.  Recent MRI with brain mass concerning for metastasis -Reorientation and delirium precaution  Demand ischemia: In setting of A Fib RVR and CHF    DCIS left breast with mets to brain -Continue Decadron -Per oncology and radiation oncology   Controlled DM-2 with hyperglycemia: A1c 6.5% in 01/2023. Recent Labs  Lab 03/12/23 1701 03/12/23 2126 03/13/23 0805 03/13/23 0857 03/13/23 1220  GLUCAP 88 106* 64* 112* 161*  -Continue current insulin regimen  Essential hypertension: Soft BP last night and this morning but improved. -Low-dose metoprolol as above.   Cognitive impairment -Resume home [place].   Goals of care: Patient with multiple complicated comorbidities as above.  Poor long-term prognosis.  Still full code. -Palliative medicine following  Thrombocytopenia: Continue monitoring  Physical deconditioning -PT/OT   Body mass index is 27.81 kg/m.  Pressure skin injury: POA Pressure Injury 03/09/23 Buttocks Right Stage 2 -  Partial thickness loss of dermis presenting as a shallow open injury with a red, pink wound bed without slough. less than 1cm size. pink/red (Active)  03/09/23 1854  Location: Buttocks  Location Orientation: Right  Staging: Stage 2 -  Partial thickness loss of dermis presenting as a shallow open injury with a red, pink wound bed without slough.  Wound Description (Comments): less than 1cm size. pink/red  Present on Admission: Yes  Dressing Type Other (Comment) (sacral foam CD&I) 03/12/23 1950   DVT prophylaxis:  Place and maintain sequential compression device Start: 03/13/23 1058 Place TED hose Start: 03/12/23 1159 Place TED hose Start: 03/09/23 1723  Code Status: Full code Family Communication: Updated patient's daughters at bedside Level of  care: Progressive Status is: Inpatient Remains inpatient appropriate because: A-fib with RVR, acute CHF and AKI   Final disposition: TBD Consultants:  Cardiology Palliative medicine  55 minutes with more than 50% spent in reviewing records, counseling patient/family and coordinating care.   Sch Meds:  Scheduled Meds:  brimonidine  1 drop Left Eye BID   And   timolol  1 drop Left Eye BID   Chlorhexidine Gluconate Cloth  6 each Topical Daily   dexamethasone (DECADRON) injection  2 mg Intravenous Q12H   dorzolamide  1 drop Left Eye BID   insulin aspart  0-9 Units Subcutaneous TID WC   metoprolol tartrate  12.5 mg Oral BID   ofloxacin  1 drop Right Eye QID   prednisoLONE acetate  1 drop Right Eye QID   Tafamidis  61 mg Oral Daily   Continuous Infusions: PRN Meds:.acetaminophen **OR** acetaminophen, mouth rinse  Antimicrobials: Anti-infectives (From admission, onward)    None        I have personally reviewed the following labs and images: CBC: Recent Labs  Lab 03/06/23 2105 03/09/23 1204 03/09/23 1214 03/10/23 0233 03/11/23 0156 03/13/23 0232  WBC 15.0* 12.3*  --  10.8* 10.3 7.5  NEUTROABS 13.7* 11.2*  --   --   --   --  HGB 15.0 14.0 14.3 13.2 14.0 12.2  HCT 46.0 42.9 42.0 38.3 40.0 34.1*  MCV 90.7 89.9  --  84.5 83.2 83.2  PLT 247 149*  --  160 172 116*   BMP &GFR Recent Labs  Lab 03/09/23 1402 03/09/23 1943 03/10/23 0233 03/11/23 0156 03/12/23 0231 03/13/23 0232  NA 125*  --  127* 124* 124* 137  K 4.3  --  3.9 4.2 4.0 3.5  CL 95*  --  98 95* 95* 105  CO2 23  --  22 22 22 24   GLUCOSE 254*  --  241* 156* 110* 73  BUN 26*  --  32* 45* 59* 43*  CREATININE 0.89  --  0.90 1.36* 1.89* 1.00  CALCIUM 8.9  --  9.1 9.0 8.8* 8.6*  MG  --  2.1  --  2.2  --  2.1   Estimated Creatinine Clearance: 38.2 mL/min (by C-G formula based on SCr of 1 mg/dL). Liver & Pancreas: Recent Labs  Lab 03/06/23 2243 03/09/23 1402  AST 23 34  ALT 36 27  ALKPHOS 49 50   BILITOT 0.8 1.3*  PROT 5.3* 4.7*  ALBUMIN 3.0* 2.4*   Recent Labs  Lab 03/06/23 2243  LIPASE 47   No results for input(s): "AMMONIA" in the last 168 hours. Diabetic: No results for input(s): "HGBA1C" in the last 72 hours. Recent Labs  Lab 03/12/23 1701 03/12/23 2126 03/13/23 0805 03/13/23 0857 03/13/23 1220  GLUCAP 88 106* 64* 112* 161*   Cardiac Enzymes: No results for input(s): "CKTOTAL", "CKMB", "CKMBINDEX", "TROPONINI" in the last 168 hours. No results for input(s): "PROBNP" in the last 8760 hours. Coagulation Profile: Recent Labs  Lab 03/09/23 1943  INR 1.7*   Thyroid Function Tests: No results for input(s): "TSH", "T4TOTAL", "FREET4", "T3FREE", "THYROIDAB" in the last 72 hours. Lipid Profile: No results for input(s): "CHOL", "HDL", "LDLCALC", "TRIG", "CHOLHDL", "LDLDIRECT" in the last 72 hours. Anemia Panel: No results for input(s): "VITAMINB12", "FOLATE", "FERRITIN", "TIBC", "IRON", "RETICCTPCT" in the last 72 hours. Urine analysis:    Component Value Date/Time   COLORURINE YELLOW 03/10/2023 0424   APPEARANCEUR CLEAR 03/10/2023 0424   LABSPEC 1.031 (H) 03/10/2023 0424   PHURINE 7.0 03/10/2023 0424   GLUCOSEU NEGATIVE 03/10/2023 0424   HGBUR SMALL (A) 03/10/2023 0424   BILIRUBINUR NEGATIVE 03/10/2023 0424   KETONESUR NEGATIVE 03/10/2023 0424   PROTEINUR NEGATIVE 03/10/2023 0424   UROBILINOGEN 0.2 03/23/2010 1156   NITRITE NEGATIVE 03/10/2023 0424   LEUKOCYTESUR NEGATIVE 03/10/2023 0424   Sepsis Labs: Invalid input(s): "PROCALCITONIN", "LACTICIDVEN"  Microbiology: Recent Results (from the past 240 hour(s))  Resp panel by RT-PCR (RSV, Flu A&B, Covid) Anterior Nasal Swab     Status: None   Collection Time: 03/09/23 11:34 AM   Specimen: Anterior Nasal Swab  Result Value Ref Range Status   SARS Coronavirus 2 by RT PCR NEGATIVE NEGATIVE Final   Influenza A by PCR NEGATIVE NEGATIVE Final   Influenza B by PCR NEGATIVE NEGATIVE Final    Comment:  (NOTE) The Xpert Xpress SARS-CoV-2/FLU/RSV plus assay is intended as an aid in the diagnosis of influenza from Nasopharyngeal swab specimens and should not be used as a sole basis for treatment. Nasal washings and aspirates are unacceptable for Xpert Xpress SARS-CoV-2/FLU/RSV testing.  Fact Sheet for Patients: EntrepreneurPulse.com.au  Fact Sheet for Healthcare Providers: IncredibleEmployment.be  This test is not yet approved or cleared by the [place] FDA and has been authorized for detection and/or diagnosis of SARS-CoV-2 by FDA under  an Emergency Use Authorization (EUA). This EUA will remain in effect (meaning this test can be used) for the duration of the COVID-19 declaration under Section 564(b)(1) of the Act, 21 U.S.C. section 360bbb-3(b)(1), unless the authorization is terminated or revoked.     Resp Syncytial Virus by PCR NEGATIVE NEGATIVE Final    Comment: (NOTE) Fact Sheet for Patients: EntrepreneurPulse.com.au  Fact Sheet for Healthcare Providers: IncredibleEmployment.be  This test is not yet approved or cleared by the [place] FDA and has been authorized for detection and/or diagnosis of SARS-CoV-2 by FDA under an Emergency Use Authorization (EUA). This EUA will remain in effect (meaning this test can be used) for the duration of the COVID-19 declaration under Section 564(b)(1) of the Act, 21 U.S.C. section 360bbb-3(b)(1), unless the authorization is terminated or revoked.  Performed at [facility], [place] [address] [place], [place] [address]     Radiology Studies: No results found.    Joshual Terrio T. [place]  If 7PM-7AM, please contact night-coverage [email or web address] 03/13/2023, 2:37 PM

## 2023-03-14 DIAGNOSIS — I10 Essential (primary) hypertension: Secondary | ICD-10-CM | POA: Diagnosis not present

## 2023-03-14 DIAGNOSIS — D72829 Elevated white blood cell count, unspecified: Secondary | ICD-10-CM | POA: Diagnosis not present

## 2023-03-14 DIAGNOSIS — I4891 Unspecified atrial fibrillation: Secondary | ICD-10-CM | POA: Diagnosis not present

## 2023-03-14 DIAGNOSIS — G9340 Encephalopathy, unspecified: Secondary | ICD-10-CM | POA: Diagnosis not present

## 2023-03-14 LAB — RENAL FUNCTION PANEL
Albumin: 1.8 g/dL — ABNORMAL LOW (ref 3.5–5.0)
Anion gap: 3 — ABNORMAL LOW (ref 5–15)
BUN: 27 mg/dL — ABNORMAL HIGH (ref 8–23)
CO2: 24 mmol/L (ref 22–32)
Calcium: 8.5 mg/dL — ABNORMAL LOW (ref 8.9–10.3)
Chloride: 110 mmol/L (ref 98–111)
Creatinine, Ser: 0.75 mg/dL (ref 0.44–1.00)
GFR, Estimated: >60 mL/min (ref >60)
Glucose, Bld: 122 mg/dL — ABNORMAL HIGH (ref 70–99)
Phosphorus: 2.1 mg/dL — ABNORMAL LOW (ref 2.5–4.6)
Potassium: 3.9 mmol/L (ref 3.5–5.1)
Sodium: 137 mmol/L (ref 135–145)

## 2023-03-14 LAB — CBC
HCT: 32.3 % — ABNORMAL LOW (ref 36.0–46.0)
Hemoglobin: 11.5 g/dL — ABNORMAL LOW (ref 12.0–15.0)
MCH: 29.8 pg (ref 26.0–34.0)
MCHC: 35.6 g/dL (ref 30.0–36.0)
MCV: 83.7 fL (ref 80.0–100.0)
Platelets: 107 10*3/uL — ABNORMAL LOW (ref 150–400)
RBC: 3.86 MIL/uL — ABNORMAL LOW (ref 3.87–5.11)
RDW: 17.1 % — ABNORMAL HIGH (ref 11.5–15.5)
WBC: 6.7 10*3/uL (ref 4.0–10.5)
nRBC: 0 % (ref 0.0–0.2)

## 2023-03-14 LAB — GLUCOSE, CAPILLARY
Glucose-Capillary: 128 mg/dL — ABNORMAL HIGH (ref 70–99)
Glucose-Capillary: 146 mg/dL — ABNORMAL HIGH (ref 70–99)
Glucose-Capillary: 177 mg/dL — ABNORMAL HIGH (ref 70–99)
Glucose-Capillary: 184 mg/dL — ABNORMAL HIGH (ref 70–99)

## 2023-03-14 LAB — MAGNESIUM: Magnesium: 2 mg/dL (ref 1.7–2.4)

## 2023-03-14 MED ORDER — SODIUM CHLORIDE 0.9 % IV SOLN: INTRAVENOUS | Status: DC | PRN

## 2023-03-14 MED ORDER — POTASSIUM PHOSPHATES 15 MMOLE/5ML IV SOLN
15.0000 mmol | Freq: Once | INTRAVENOUS | Status: AC
  Administered 2023-03-14: 15 mmol via INTRAVENOUS
  Filled 2023-03-14: qty 5

## 2023-03-14 MED ORDER — METOPROLOL TARTRATE 25 MG PO TABS
25.0000 mg | ORAL_TABLET | Freq: Two times a day (BID) | ORAL | Status: DC
  Administered 2023-03-14 – 2023-03-15 (×3): 25 mg via ORAL
  Filled 2023-03-14 (×3): qty 1

## 2023-03-14 MED ORDER — GERHARDT'S BUTT CREAM
TOPICAL_CREAM | Freq: Two times a day (BID) | CUTANEOUS | Status: DC
  Administered 2023-03-16: 1 via TOPICAL
  Filled 2023-03-14: qty 1

## 2023-03-14 NOTE — Progress Notes (Signed)
  RE: Amanda Green       Date of Birth: 25-Aug-1935     Date:  03/14/2023        To Whom It May Concern:  Please be advised that the above-named patient will require a short-term nursing home stay - anticipated 30 days or less for rehabilitation and strengthening.  The plan is for return home.

## 2023-03-14 NOTE — Progress Notes (Signed)
PROGRESS NOTE  Amanda Green [id number] DOB: 1935/08/22   PCP: Nicholas Lose, MD  Patient is from: Home  DOA: 03/09/2023 LOS: 5  Chief complaints Chief Complaint  Patient presents with   Leg Swelling     Brief Narrative / Interim history: 88 year old F with PMH of BRCA with brain mets s/p bilateral mastectomy, DM-2, dementia, HTN, CVA and cardiac amyloidosis presenting with shortness of breath and edema and admitted for A-fib with RVR, acute diastolic CHF and AKI.  Patient was started on IV Lasix for CHF.  Cardiology consulted and started on digoxin for rate control due to marginal blood pressures.  However, digoxin held due to AKI.  Diuretics held due to soft blood pressures.  Patient continued to decline despite all interventions.  Continued to be fatigued and lethargic.  Palliative medicine consulted and following.  Hospital course complicated by acute urinary retention requiring Foley catheter.   Subjective: Seen and examined earlier this morning.  No major events overnight of this morning.  No complaints but not a great historian.  Daughter and son-in-law at bedside.  AKI resolved.  Blood pressure improved.  Remains in RVR with HR ranging from 110-1 120s.  Objective: Vitals:   03/14/23 0452 03/14/23 0500 03/14/23 0809 03/14/23 1148  BP: 119/68  117/72 114/83  Pulse: (!) 119  (!) 117 (!) 107  Resp: 18  18 (!) 21  Temp: 98.3 F (36.8 C)  97.7 F (36.5 C)   TempSrc: Oral  Oral   SpO2: 95%  99% 99%  Weight:  71.4 kg    Height:        Examination:  GENERAL: No apparent distress.  Nontoxic. HEENT: MMM.  Vision and hearing grossly intact.  NECK: Supple.  No apparent JVD.  RESP:  No IWOB.  Fair aeration bilaterally. CVS: Irregular rhythm.  HR in 110s.  Heart sounds normal.  ABD/GI/GU: BS+. Abd soft, NTND.  MSK/EXT:   No apparent deformity. Moves extremities.  Trace BLE edema. SKIN: no apparent skin lesion or wound NEURO: Awake and alert. Oriented to self, place  and person.  Follows commands.  No apparent focal neuro deficit. PSYCH: Calm. Normal affect.   Procedures:  None  Microbiology summarized: [id number], influenza and RSV PCR nonreactive.  Assessment and plan: Principal Problem:   Atrial fibrillation with RVR (HCC) Active Problems:   Acute on chronic diastolic CHF (congestive heart failure) (HCC)   Hyponatremia   Elevated troponin   Ductal carcinoma in situ (DCIS) of left breast   Cardiac amyloidosis (HCC)   Controlled type 2 diabetes mellitus without complication, without long-term current use of insulin (HCC)   Essential hypertension   Cognitive impairment   Leukocytosis   Encephalopathy acute  A-fib with RVR: Initially started on Cardizem drip that was discontinued due to hypotension.  Eliquis discontinued due to concern for brain bleed in the setting of worsening mentation and brain mets.  Digoxin held due to AKI but AKI resolved now.  Hypotension resolved as well.  Cardiology recommended palliative care consult. -Increase p.o. metoprolol to 25 mg twice daily -Optimize electrolytes -Palliative medicine following.   Acute on chronic diastolic CHF: Exam with some generalized edema.  Does not seems to have respiratory distress. Cardiac amyloidosis  -Continue tafamidis -Diuretics on hold due to AKI and hypotension. -Strict I's and O's, daily weight, fluid restriction -Ted hose    AKI: Likely prerenal from hypotension and diuretics.  Renal US negative.  Resolved. Recent Labs    02/13/23 2205 02/23/23 2225 03/01/23  1004 03/06/23 2243 03/09/23 1402 03/10/23 0233 03/11/23 0156 03/12/23 0231 03/13/23 0232 03/14/23 0139  BUN 12 49* 31* 30* 26* 32* 45* 59* 43* 27*  CREATININE 0.92 1.20* 0.83 0.91 0.89 0.90 1.36* 1.89* 1.00 0.75  -Continue holding nephrotoxic  Acute urinary retention/hematuria: Seems like she has significant urinary tension with bladder over distention the night of 3/26.  Hematuria likely traumatic from I and  O cath.  Renal US negative. -Continue Foley catheter   Acute metabolic encephalopathy/underlying dementia: Awake, alert and oriented to self, place and family.  CT head without acute finding.  Recent MRI with brain mass concerning for metastasis -Reorientation and delirium precaution  Demand ischemia: In setting of A Fib RVR and CHF    DCIS left breast with mets to brain -Continue Decadron -Per oncology and radiation oncology   Controlled DM-2 with hyperglycemia: A1c 6.5% in 01/2023. Recent Labs  Lab 03/13/23 1220 03/13/23 1623 03/13/23 2102 03/14/23 0808 03/14/23 1215  GLUCAP 161* 201* 149* 128* 146*  -Continue current insulin regimen  Essential hypertension: Normotensive this morning. -Low-dose metoprolol as above.   Cognitive impairment -Continue Namenda.   Goals of care: Patient with multiple complicated comorbidities as above.  Poor long-term prognosis.  Still full code. -Palliative medicine following  Thrombocytopenia: Continue monitoring  Physical deconditioning -PT/OT  Body mass index is 27.88 kg/m.  Pressure skin injury: POA Pressure Injury 03/09/23 Buttocks Right Stage 2 -  Partial thickness loss of dermis presenting as a shallow open injury with a red, pink wound bed without slough. less than 1cm size. pink/red (Active)  03/09/23 1854  Location: Buttocks  Location Orientation: Right  Staging: Stage 2 -  Partial thickness loss of dermis presenting as a shallow open injury with a red, pink wound bed without slough.  Wound Description (Comments): less than 1cm size. pink/red  Present on Admission: Yes  Dressing Type None;Moisture barrier 03/14/23 0800   DVT prophylaxis:  Place and maintain sequential compression device Start: 03/13/23 1058 Place TED hose Start: 03/12/23 1159 Place TED hose Start: 03/09/23 1723  Code Status: Full code Family Communication: Updated patient's daughter and grandson at bedside Level of care: Progressive Status is:  Inpatient Remains inpatient appropriate because: A-fib with RVR, acute CHF   Final disposition: Home on discharge Consultants:  Cardiology Palliative medicine  55 minutes with more than 50% spent in reviewing records, counseling patient/family and coordinating care.   Sch Meds:  Scheduled Meds:  brimonidine  1 drop Left Eye BID   And   timolol  1 drop Left Eye BID   Chlorhexidine Gluconate Cloth  6 each Topical Daily   dexamethasone (DECADRON) injection  2 mg Intravenous Q12H   dorzolamide  1 drop Left Eye BID   Gerhardt's butt cream   Topical BID   insulin aspart  0-9 Units Subcutaneous TID WC   metoprolol tartrate  25 mg Oral BID   ofloxacin  1 drop Right Eye QID   prednisoLONE acetate  1 drop Right Eye QID   Tafamidis  61 mg Oral Daily   Continuous Infusions:  sodium chloride 5 mL/hr at 03/14/23 0951   potassium PHOSPHATE IVPB (in mmol) 15 mmol (03/14/23 0953)   PRN Meds:.sodium chloride, acetaminophen **OR** acetaminophen, mouth rinse  Antimicrobials: Anti-infectives (From admission, onward)    None        I have personally reviewed the following labs and images: CBC: Recent Labs  Lab 03/09/23 1204 03/09/23 1214 03/10/23 0233 03/11/23 0156 03/13/23 0232 03/14/23 0139  WBC  12.3*  --  10.8* 10.3 7.5 6.7  NEUTROABS 11.2*  --   --   --   --   --   HGB 14.0 14.3 13.2 14.0 12.2 11.5*  HCT 42.9 42.0 38.3 40.0 34.1* 32.3*  MCV 89.9  --  84.5 83.2 83.2 83.7  PLT 149*  --  160 172 116* 107*   BMP &GFR Recent Labs  Lab 03/09/23 1943 03/10/23 0233 03/11/23 0156 03/12/23 0231 03/13/23 0232 03/14/23 0139  NA  --  127* 124* 124* 137 137  K  --  3.9 4.2 4.0 3.5 3.9  CL  --  98 95* 95* 105 110  CO2  --  22 22 22 24 24   GLUCOSE  --  241* 156* 110* 73 122*  BUN  --  32* 45* 59* 43* 27*  CREATININE  --  0.90 1.36* 1.89* 1.00 0.75  CALCIUM  --  9.1 9.0 8.8* 8.6* 8.5*  MG 2.1  --  2.2  --  2.1 2.0  PHOS  --   --   --   --   --  2.1*   Estimated Creatinine  Clearance: 47.8 mL/min (by C-G formula based on SCr of 0.75 mg/dL). Liver & Pancreas: Recent Labs  Lab 03/09/23 1402 03/14/23 0139  AST 34  --   ALT 27  --   ALKPHOS 50  --   BILITOT 1.3*  --   PROT 4.7*  --   ALBUMIN 2.4* 1.8*   No results for input(s): "LIPASE", "AMYLASE" in the last 168 hours.  No results for input(s): "AMMONIA" in the last 168 hours. Diabetic: No results for input(s): "HGBA1C" in the last 72 hours. Recent Labs  Lab 03/13/23 1220 03/13/23 1623 03/13/23 2102 03/14/23 0808 03/14/23 1215  GLUCAP 161* 201* 149* 128* 146*   Cardiac Enzymes: No results for input(s): "CKTOTAL", "CKMB", "CKMBINDEX", "TROPONINI" in the last 168 hours. No results for input(s): "PROBNP" in the last 8760 hours. Coagulation Profile: Recent Labs  Lab 03/09/23 1943  INR 1.7*   Thyroid Function Tests: No results for input(s): "TSH", "T4TOTAL", "FREET4", "T3FREE", "THYROIDAB" in the last 72 hours. Lipid Profile: No results for input(s): "CHOL", "HDL", "LDLCALC", "TRIG", "CHOLHDL", "LDLDIRECT" in the last 72 hours. Anemia Panel: No results for input(s): "VITAMINB12", "FOLATE", "FERRITIN", "TIBC", "IRON", "RETICCTPCT" in the last 72 hours. Urine analysis:    Component Value Date/Time   COLORURINE YELLOW 03/10/2023 0424   APPEARANCEUR CLEAR 03/10/2023 0424   LABSPEC 1.031 (H) 03/10/2023 0424   PHURINE 7.0 03/10/2023 0424   GLUCOSEU NEGATIVE 03/10/2023 0424   HGBUR SMALL (A) 03/10/2023 0424   BILIRUBINUR NEGATIVE 03/10/2023 0424   KETONESUR NEGATIVE 03/10/2023 0424   PROTEINUR NEGATIVE 03/10/2023 0424   UROBILINOGEN 0.2 03/23/2010 1156   NITRITE NEGATIVE 03/10/2023 0424   LEUKOCYTESUR NEGATIVE 03/10/2023 0424   Sepsis Labs: Invalid input(s): "PROCALCITONIN", "LACTICIDVEN"  Microbiology: Recent Results (from the past 240 hour(s))  Resp panel by RT-PCR (RSV, Flu A&B, Covid) Anterior Nasal Swab     Status: None   Collection Time: 03/09/23 11:34 AM   Specimen: Anterior  Nasal Swab  Result Value Ref Range Status   SARS Coronavirus 2 by RT PCR NEGATIVE NEGATIVE Final   Influenza A by PCR NEGATIVE NEGATIVE Final   Influenza B by PCR NEGATIVE NEGATIVE Final    Comment: (NOTE) The Xpert Xpress SARS-CoV-2/FLU/RSV plus assay is intended as an aid in the diagnosis of influenza from Nasopharyngeal swab specimens and should not be used as a sole  basis for treatment. Nasal washings and aspirates are unacceptable for Xpert Xpress SARS-CoV-2/FLU/RSV testing.  Fact Sheet for Patients: EntrepreneurPulse.com.au  Fact Sheet for Healthcare Providers: IncredibleEmployment.be  This test is not yet approved or cleared by the [place] FDA and has been authorized for detection and/or diagnosis of SARS-CoV-2 by FDA under an Emergency Use Authorization (EUA). This EUA will remain in effect (meaning this test can be used) for the duration of the COVID-19 declaration under Section 564(b)(1) of the Act, 21 U.S.C. section 360bbb-3(b)(1), unless the authorization is terminated or revoked.     Resp Syncytial Virus by PCR NEGATIVE NEGATIVE Final    Comment: (NOTE) Fact Sheet for Patients: EntrepreneurPulse.com.au  Fact Sheet for Healthcare Providers: IncredibleEmployment.be  This test is not yet approved or cleared by the [place] FDA and has been authorized for detection and/or diagnosis of SARS-CoV-2 by FDA under an Emergency Use Authorization (EUA). This EUA will remain in effect (meaning this test can be used) for the duration of the COVID-19 declaration under Section 564(b)(1) of the Act, 21 U.S.C. section 360bbb-3(b)(1), unless the authorization is terminated or revoked.  Performed at [facility], [place] [address], [place], [place] [address]     Radiology Studies: US RENAL  Result Date: 03/13/2023 CLINICAL DATA:  Acute urinary retention EXAM: RENAL / URINARY TRACT  ULTRASOUND COMPLETE COMPARISON:  02/14/2023 FINDINGS: Right Kidney: Renal measurements: 11.3 x 4.3 x4.2 cm = volume: 107.5 mL. Echogenicity within normal limits. No mass or hydronephrosis visualized. Left Kidney: Renal measurements: 9.1 x 5.1 x 5.1 cm = volume: 124.6 mL. Echogenicity within normal limits. No mass or hydronephrosis visualized. Bladder: Appears normal for degree of bladder distention. Other: Foley in the bladder which is empty IMPRESSION: Negative for hydronephrosis. Foley in the bladder which is empty. Electronically Signed   By: Donavan Foil M.D.   On: 03/13/2023 20:01      Katriona Schmierer T. Kinder  If 7PM-7AM, please contact night-coverage [email or web address] 03/14/2023, 1:31 PM

## 2023-03-14 NOTE — Evaluation (Signed)
Physical Therapy Evaluation Patient Details Name: Amanda Green MRN: [id number] DOB: 29-Jun-1935 Today's Date: 03/14/2023  History of Present Illness  88 y.o. 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  Clinical Impression  Pt presents with admitting diagnosis above. Pt received in chair and required +2 Max A for stand pivot transfer back to bed. Pt was very lethargic and not able to participate much in session today. Per OT note and family, pt has been sedentary at baseline and accepts any assist provided. Based on todays session, PT will recommend post acute rehab up to 3 hours/day prior to returning home to maximize her functional status, unless family can provide the needed assist, and prefer to take her home. Pt will continue to follow.     Recommendations for follow up therapy are one component of a multi-disciplinary discharge planning process, led by the attending physician.  Recommendations may be updated based on patient status, additional functional criteria and insurance authorization.  Follow Up Recommendations Can patient physically be transported by private vehicle: No     Assistance Recommended at Discharge Frequent or constant Supervision/Assistance  Patient can return home with the following  Assistance with cooking/housework;Help with stairs or ramp for entrance;Assist for transportation;Two people to help with walking and/or transfers;A lot of help with bathing/dressing/bathroom    Equipment Recommendations None recommended by PT  Recommendations for Other Services       Functional Status Assessment Patient has had a recent decline in their functional status and demonstrates the  ability to make significant improvements in function in a reasonable and predictable amount of time.     Precautions / Restrictions Precautions Precautions: Fall Restrictions Weight Bearing Restrictions: No      Mobility  Bed Mobility Overal bed mobility: Needs Assistance Bed Mobility: Sit to Supine       Sit to supine: Max assist        Transfers Overall transfer level: Needs assistance   Transfers: Sit to/from Stand, Bed to chair/wheelchair/BSC Sit to Stand: Max assist, +2 physical assistance Stand pivot transfers: +2 physical assistance, Max assist         General transfer comment: Pt was very lethargic and not putting forth much effort    Ambulation/Gait                  Stairs            Wheelchair Mobility    Modified Rankin (Stroke Patients Only)       Balance Overall balance assessment: Needs assistance Sitting-balance support: Feet supported Sitting balance-Leahy Scale: Fair     Standing balance support: Bilateral upper extremity supported Standing balance-Leahy Scale: Poor                               Pertinent Vitals/Pain Pain Assessment Pain Assessment: No/denies pain    Home Living Family/patient expects to be discharged to:: Private residence Living Arrangements: Other relatives Available Help at Discharge: Family;Available 24 hours/day Type of Home: House Home Access: Ramped entrance       Home Layout: One level Home Equipment: Conservation officer, nature (2 wheels);Rollator (4 wheels);Cane - single point;Shower seat;BSC/3in1;Wheelchair -  manual Additional Comments: daughter reports family can provide 24/7 assist if needed    Prior Function Prior Level of Function : Needs assist             Mobility Comments: Daughter reports spending most of the day in bed, transferring when needed ADLs Comments: Family providing needed assist for ADL and iADL.  Patient sedentary.     Hand Dominance   Dominant Hand:  Right    Extremity/Trunk Assessment   Upper Extremity Assessment Upper Extremity Assessment: Generalized weakness    Lower Extremity Assessment Lower Extremity Assessment: Defer to PT evaluation    Cervical / Trunk Assessment Cervical / Trunk Assessment: Kyphotic  Communication   Communication: No difficulties  Cognition Arousal/Alertness: Lethargic Behavior During Therapy: Flat affect Overall Cognitive Status: Within Functional Limits for tasks assessed                                 General Comments: overall WFL, flat affect, slowed processing, anticipate baseline        General Comments General comments (skin integrity, edema, etc.): VSS on RA. Family present and very supportive    Exercises     Assessment/Plan    PT Assessment Patient needs continued PT services  PT Problem List Decreased strength;Decreased activity tolerance;Decreased balance;Decreased mobility       PT Treatment Interventions DME instruction;Gait training;Stair training;Functional mobility training;Therapeutic activities;Therapeutic exercise;Neuromuscular re-education;Patient/family education    PT Goals (Current goals can be found in the Care Plan section)  Acute Rehab PT Goals Patient Stated Goal: go home PT Goal Formulation: With family Time For Goal Achievement: 03/28/23 Potential to Achieve Goals: Poor    Frequency Min 1X/week     Co-evaluation               AM-PAC PT "6 Clicks" Mobility  Outcome Measure Help needed turning from your back to your side while in a flat bed without using bedrails?: Total Help needed moving from lying on your back to sitting on the side of a flat bed without using bedrails?: Total Help needed moving to and from a bed to a chair (including a wheelchair)?: Total Help needed standing up from a chair using your arms (e.g., wheelchair or bedside chair)?: Total Help needed to walk in hospital room?: Total Help needed climbing 3-5 steps  with a railing? : Total 6 Click Score: 6    End of Session Equipment Utilized During Treatment: Gait belt Activity Tolerance: Patient limited by lethargy Patient left: in bed;with call bell/phone within reach;with family/visitor present;with nursing/sitter in room Nurse Communication: Mobility status PT Visit Diagnosis: Unsteadiness on feet (R26.81);Other abnormalities of gait and mobility (R26.89);Muscle weakness (generalized) (M62.81);History of falling (Z91.81)    Time: [id number] PT Time Calculation (min) (ACUTE ONLY): 19 min   Charges:   PT Evaluation $PT Eval Moderate Complexity: 1 Mod PT Treatments $Therapeutic Activity: 8-22 mins        Shelby Mattocks, PT, DPT Acute Rehab Services [id number]   Viann Shove 03/14/2023, 1:33 PM

## 2023-03-14 NOTE — Progress Notes (Signed)
   Reviewed telemetry. Patient remains in atrial fibrillation; however, rates reasonably controlled in the low 100s to 110s most of the time. She did have some higher rates in the 130s earlier this morning but was back in the low 100s during time of telemetry review. BP has improved with IV fluids. She had one reading of 92/63 around midnight but BP has mostly been in the 110-120s/60-70s. Options for her atrial fibrillation are limited due to hypotension and inability to anticoagulate due to brain metastasis. We tried Digoxin earlier this admission but it had to be stopped due to worsening renal function. Renal function has now returned to normal after IV fluids.  Discussed with Dr. Margaretann Loveless. Will continue to monitor for now. If rates increase and she starts to have hemodynamic instability, could consider restarting Digoxin given renal function has improved. We would be happy to assist in the dosing of this. Otherwise, no new cardiac recommendations. Please reach out to [place] if needed.  Darreld Mclean, PA-C 03/14/2023 11:11 AM

## 2023-03-14 NOTE — TOC Initial Note (Signed)
Transition of Care [facility]) - Initial/Assessment Note    Patient Details  Name: Amanda Green MRN: [id number] Date of Birth: 1935/05/23  Transition of Care [facility]) CM/SW Contact:    Milas Gain, [place] Phone Number: 03/14/2023, 2:19 PM  Clinical Narrative:                  CSW received consult for possible SNF placement at time of discharge. Due to patients current orientation CSW spoke with patients daughter [name] regarding PT recommendation of SNF placement at time of discharge. PTA patients daughter reports patient comes from home with her and her sister.Patient expressed understanding of PT recommendation and is agreeable to SNF placement at time of discharge. Patient gave CSW permission to fax out initial referral near the [facility] area for possible SNF placement. CSW discussed insurance authorization process. Patient expressed being hopeful for rehab and to feel better soon. No further questions reported at this time. CSW to continue to follow and assist with discharge planning needs.    Barriers to Discharge: Continued Medical Work up   Patient Goals and CMS Choice     Choice offered to / list presented to : Adult Children (Patients daughter [name])      Expected Discharge Plan and Services In-house Referral: Clinical Social Work     Living arrangements for the past 2 months: Single Family Home                                      Prior Living Arrangements/Services Living arrangements for the past 2 months: Single Family Home Lives with:: Adult Children (daughters) Patient language and need for interpreter reviewed:: Yes        Need for Family Participation in Patient Care: Yes (Comment) Care giver support system in place?: Yes (comment)   Criminal Activity/Legal Involvement Pertinent to Current Situation/Hospitalization: No - Comment as needed  Activities of Daily Living Home Assistive Devices/Equipment: Walker (specify type) ADL Screening  (condition at time of admission) Patient's cognitive ability adequate to safely complete daily activities?: No Is the patient deaf or have difficulty hearing?: No Does the patient have difficulty seeing, even when wearing glasses/contacts?: No Does the patient have difficulty concentrating, remembering, or making decisions?: Yes Patient able to express need for assistance with ADLs?: No Does the patient have difficulty dressing or bathing?: Yes Independently performs ADLs?: No Communication: Dependent Is this a change from baseline?: Pre-admission baseline Dressing (OT): Dependent Is this a change from baseline?: Pre-admission baseline Grooming: Dependent Is this a change from baseline?: Pre-admission baseline Feeding: Dependent Is this a change from baseline?: Pre-admission baseline Bathing: Dependent Is this a change from baseline?: Pre-admission baseline Toileting: Dependent Is this a change from baseline?: Pre-admission baseline In/Out Bed: Dependent Is this a change from baseline?: Pre-admission baseline Walks in Home: Dependent Is this a change from baseline?: Change from baseline, expected to last >3 days Does the patient have difficulty walking or climbing stairs?: Yes Weakness of Legs: Both Weakness of Arms/Hands: None  Permission Sought/Granted Permission sought to share information with : Case Manager, Family Supports, Chartered certified accountant granted to share information with : No  Share Information with NAME: Due to current orientation CSW spoke with patients daughter [name]  Permission granted to share info w AGENCY: Due to current orientation CSW spoke with patients daughter Belinda/SNF  Permission granted to share info w Relationship: Due to current orientation CSW spoke with  patients daughter [name]  Permission granted to share info w Contact Information: Due to current orientation CSW spoke with patients daughter [name]  [phone]  Emotional Assessment Appearance:: Appears stated age Attitude/Demeanor/Rapport: Gracious Affect (typically observed): Calm Orientation: : Oriented to Place, Oriented to Self Alcohol / Substance Use: Not Applicable Psych Involvement: No (comment)  Admission diagnosis:  Atrial fibrillation with RVR ([place]) [I48.91] Acute on chronic diastolic (congestive) heart failure ([place]) [I50.33] Patient Active Problem List   Diagnosis Date Noted   Encephalopathy acute 03/12/2023   Atrial fibrillation with RVR ([place]) 03/09/2023   Acute on chronic diastolic CHF (congestive heart failure) ([place]) 03/09/2023   Hyponatremia 03/09/2023   Elevated troponin 03/09/2023   Leukocytosis 03/09/2023   Neoplasm of brain causing mass effect on adjacent structures ([place]) 02/14/2023   Cardiac amyloidosis ([place]) 02/14/2023   Spinal stenosis of lumbar region with neurogenic claudication 03/15/2022   Gait abnormality 11/13/2021   Low back pain without sciatica 11/13/2021   Ductal carcinoma in situ (DCIS) of breast 05/10/2021   Paroxysmal atrial fibrillation ([place]) 04/14/2021   Essential hypertension 04/14/2021   Controlled type 2 diabetes mellitus without complication, without long-term current use of insulin ([place]) 04/14/2021   Cerebrovascular accident (CVA) ([place]) 02/07/2021   Cognitive impairment 02/07/2021   Genetic testing 09/25/2019   Family history of prostate cancer    Ductal carcinoma in situ (DCIS) of left breast 06/10/2019   Pain 08/13/2018   Trigger ring finger of left hand 08/13/2018   Paresthesia of right lower extremity 01/28/2017   Stroke ([place]) 01/28/2017   Colon polyps 08/15/2016   Chronic right shoulder pain 11/24/2015   Right mastectomy 1999 03/26/2012   PCP:  Nicholas Lose, MD Pharmacy:   [facility], [address] [place] [address] Phone: [phone] Fax: [phone]  [facility] - [place], [address] [place] [address] Phone: [phone] Fax: [phone]     Social Determinants of Health (SDOH) Social History: SDOH Screenings   Food Insecurity: No Food Insecurity (03/10/2023)  Housing: Low Risk  (03/10/2023)  Transportation Needs: No Transportation Needs (03/10/2023)  Utilities: Not At Risk (03/10/2023)  Tobacco Use: Low Risk  (03/09/2023)   SDOH Interventions:     Readmission Risk Interventions     No data to display

## 2023-03-14 NOTE — Evaluation (Signed)
Occupational Therapy Evaluation Patient Details Name: Amanda Green MRN: [id number] DOB: 19-Dec-1934 Today's Date: 03/14/2023   History of Present Illness 88 y.o. 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   Clinical Impression   Patient admitted with the diagnosis above.  PTA the patient lives at her home with 24 hour care from multiple family members.  Family states the patient is sedentary and accepts any assist provided.  Currently patient is not putting forth maximal effort, and presents with up to Max A for basic transfers and ADL completion from bedlevel.  OT is indicated in the acute setting for a rehab trial, if she participates, to address deficits listed.  OT will recommend post acute rehab up to 3 hours/day prior to returning home to maximize her functional status, unless family can provide the needed assist, and prefer to take her home.       Recommendations for follow up therapy are one component of a multi-disciplinary discharge planning process, led by the attending physician.  Recommendations may be updated based on patient status, additional functional criteria and insurance authorization.   Assistance Recommended at Discharge Frequent or constant Supervision/Assistance  Patient can return home with the following A lot of help with bathing/dressing/bathroom;Assistance with cooking/housework;Direct supervision/assist for medications management;Direct supervision/assist for financial management;Assist for transportation;Help with stairs or ramp for entrance;Two people to help with walking and/or transfers    Functional Status Assessment  Patient has had a recent decline in their  functional status and demonstrates the ability to make significant improvements in function in a reasonable and predictable amount of time.  Equipment Recommendations  None recommended by OT    Recommendations for Other Services       Precautions / Restrictions Precautions Precautions: Fall Restrictions Weight Bearing Restrictions: No      Mobility Bed Mobility Overal bed mobility: Needs Assistance Bed Mobility: Supine to Sit     Supine to sit: Mod assist, Max assist          Transfers Overall transfer level: Needs assistance   Transfers: Sit to/from Stand, Bed to chair/wheelchair/BSC Sit to Stand: Mod assist   Squat pivot transfers: Mod assist, Max assist       General transfer comment: patient can support herself, not putting forth much effort      Balance Overall balance assessment: Needs assistance Sitting-balance support: Feet supported Sitting balance-Leahy Scale: Fair     Standing balance support: Bilateral upper extremity supported Standing balance-Leahy Scale: Poor                             ADL either performed or assessed with clinical judgement   ADL Overall ADL's : Needs assistance/impaired Eating/Feeding: Set up;Bed level   Grooming: Wash/dry hands;Wash/dry face;Set up;Bed level   Upper Body Bathing: Moderate assistance;Bed level   Lower Body Bathing: Maximal assistance;Bed level   Upper Body Dressing : Moderate assistance;Bed level   Lower Body Dressing: Maximal assistance;Bed level   Toilet Transfer: Maximal assistance;Squat-pivot;BSC/3in1                   Vision   Vision Assessment?: No apparent visual deficits  Perception     Praxis      Pertinent Vitals/Pain Pain Assessment Pain Assessment: No/denies pain     Hand Dominance Right   Extremity/Trunk Assessment Upper Extremity Assessment Upper Extremity Assessment: Generalized weakness   Lower Extremity Assessment Lower Extremity Assessment:  Defer to PT evaluation   Cervical / Trunk Assessment Cervical / Trunk Assessment: Kyphotic   Communication Communication Communication: No difficulties   Cognition Arousal/Alertness: Lethargic Behavior During Therapy: Flat affect Overall Cognitive Status: Within Functional Limits for tasks assessed                                 General Comments: overall WFL, flat affect, slowed processing, anticipate baseline     General Comments   Watch HR    Exercises     Shoulder Instructions      Home Living Family/patient expects to be discharged to:: Private residence Living Arrangements: Other relatives Available Help at Discharge: Family;Available 24 hours/day Type of Home: House Home Access: Ramped entrance     Home Layout: One level     Bathroom Shower/Tub: Occupational psychologist: Standard Bathroom Accessibility: Yes How Accessible: Accessible via walker Home Equipment: [place] (2 wheels);Rollator (4 wheels);Cane - single point;Shower seat;BSC/3in1;Wheelchair - manual   Additional Comments: daughter reports family can provide 24/7 assist if needed      Prior Functioning/Environment Prior Level of Function : Needs assist             Mobility Comments: Daughter reports spending most of the day in bed, transferring when needed ADLs Comments: Family providing needed assist for ADL and iADL.  Patient sedentary.        OT Problem List: Decreased strength;Decreased range of motion;Decreased activity tolerance;Impaired balance (sitting and/or standing)      OT Treatment/Interventions: Therapeutic exercise;Self-care/ADL training;DME and/or AE instruction;Therapeutic activities;Patient/family education;Balance training    OT Goals(Current goals can be found in the care plan section) Acute Rehab OT Goals Patient Stated Goal: None stated OT Goal Formulation: With patient Time For Goal Achievement: 03/28/23 ADL Goals Pt Will Perform  Grooming: with set-up;sitting Pt Will Perform Upper Body Bathing: with set-up;sitting Pt Will Perform Upper Body Dressing: with set-up;sitting Pt Will Transfer to Toilet: with min guard assist;stand pivot transfer Pt/caregiver will Perform Home Exercise Program: Increased strength;Both right and left upper extremity;With theraband;With minimal assist  OT Frequency: Min 2X/week    Co-evaluation              AM-PAC OT "6 Clicks" Daily Activity     Outcome Measure Help from another person eating meals?: A Little Help from another person taking care of personal grooming?: A Little Help from another person toileting, which includes using toliet, bedpan, or urinal?: A Lot Help from another person bathing (including washing, rinsing, drying)?: A Lot Help from another person to put on and taking off regular upper body clothing?: A Lot Help from another person to put on and taking off regular lower body clothing?: A Lot 6 Click Score: 14   End of Session Equipment Utilized During Treatment: Gait belt;Rolling walker (2 wheels) Nurse Communication: Mobility status  Activity Tolerance: Patient limited by lethargy Patient left: in chair;with call bell/phone within reach;with family/visitor present  OT Visit Diagnosis: Unsteadiness on feet (R26.81);Other abnormalities of gait and mobility (R26.89);Muscle weakness (generalized) (M62.81);History of falling (Z91.81)                Time:  [id number] OT Time Calculation (min): 25 min Charges:  OT General Charges $OT Visit: 1 Visit OT Evaluation $OT Eval Moderate Complexity: 1 Mod OT Treatments $Self Care/Home Management : 8-22 mins  03/14/2023  RP, OTR/L  Acute Rehabilitation Services  Office:  [place] 03/14/2023, 10:26 AM

## 2023-03-14 NOTE — NC FL2 (Addendum)
[place] LEVEL OF CARE FORM     IDENTIFICATION  Patient Name: Amanda Green Birthdate: 08/14/1935 Sex: female Admission Date (Current Location): 03/09/2023  [facility] and [place] Number:  Herbalist and Address:  [facility], [place] [address] [place], [place] [address]      Provider Number: [id number]  Attending Physician Name and Address:  Mercy Riding, MD  Relative Name and Phone Number:  [name] (Daughter) [phone] Hilda Blades (daughter) [phone]    Current Level of Care: Hospital Recommended Level of Care: [place] Prior Approval Number:    Date Approved/Denied:   PASRR Number: [id number]  Discharge Plan: SNF    Current Diagnoses: Patient Active Problem List   Diagnosis Date Noted   Encephalopathy acute 03/12/2023   Atrial fibrillation with RVR ([place]) 03/09/2023   Acute on chronic diastolic CHF (congestive heart failure) ([place]) 03/09/2023   Hyponatremia 03/09/2023   Elevated troponin 03/09/2023   Leukocytosis 03/09/2023   Neoplasm of brain causing mass effect on adjacent structures ([place]) 02/14/2023   Cardiac amyloidosis ([place]) 02/14/2023   Spinal stenosis of lumbar region with neurogenic claudication 03/15/2022   Gait abnormality 11/13/2021   Low back pain without sciatica 11/13/2021   Ductal carcinoma in situ (DCIS) of breast 05/10/2021   Paroxysmal atrial fibrillation ([place]) 04/14/2021   Essential hypertension 04/14/2021   Controlled type 2 diabetes mellitus without complication, without long-term current use of insulin ([place]) 04/14/2021   Cerebrovascular accident (CVA) ([place]) 02/07/2021   Cognitive impairment 02/07/2021   Genetic testing 09/25/2019   Family history of prostate cancer    Ductal carcinoma in situ (DCIS) of left breast 06/10/2019   Pain 08/13/2018   Trigger ring finger of left hand 08/13/2018   Paresthesia of right lower extremity 01/28/2017   Stroke ([place]) 01/28/2017   Colon  polyps 08/15/2016   Chronic right shoulder pain 11/24/2015   Right mastectomy 1999 03/26/2012    Orientation RESPIRATION BLADDER Height & Weight     Self, Place  Normal Continent, External catheter (Urethral Catheter) Weight: 157 lb 6.5 oz (71.4 kg) Height:  5\' 3"  (160 cm)  BEHAVIORAL SYMPTOMS/MOOD NEUROLOGICAL BOWEL NUTRITION STATUS      Incontinent Diet (Please see discharge summary)  AMBULATORY STATUS COMMUNICATION OF NEEDS Skin   Extensive Assist Verbally Other (Comment) (Appropriate for ethnciity,dry,Erythema,Groin,Bil.,Wound/Incision LDAs,PI buttocks,Right stage 2,foam lift dressing,PRN,dressing in place)                       Personal Care Assistance Level of Assistance  Bathing, Dressing, Feeding Bathing Assistance: Maximum assistance Feeding assistance: Limited assistance Dressing Assistance: Maximum assistance     Functional Limitations Info  Sight, Hearing, Speech Sight Info: Impaired Hearing Info: Adequate Speech Info: Adequate    SPECIAL CARE FACTORS FREQUENCY  PT (By licensed PT), OT (By licensed OT)     PT Frequency: 5x min weekly OT Frequency: 5x min weekly            Contractures Contractures Info: Not present    Additional Factors Info  Code Status, Allergies, Insulin Sliding Scale Code Status Info: FULL Allergies Info: Gabapentin   Insulin Sliding Scale Info: insulin aspart (novoLOG) injection 0-9 Units 3 times daily with meals       Current Medications (03/14/2023):  This is the current hospital active medication list Current Facility-Administered Medications  Medication Dose Route Frequency Provider Last Rate Last Admin   0.9 %  sodium chloride infusion   Intravenous  PRN Mercy Riding, MD 5 mL/hr at 03/14/23 0951 New Bag at 03/14/23 0951   acetaminophen (TYLENOL) tablet 650 mg  650 mg Oral Q6H PRN Orma Flaming, MD   650 mg at 03/10/23 1814   Or   acetaminophen (TYLENOL) suppository 650 mg  650 mg Rectal Q6H PRN Orma Flaming, MD        brimonidine (ALPHAGAN) 0.2 % ophthalmic solution 1 drop  1 drop Left Eye BID Orma Flaming, MD   1 drop at 03/14/23 [id number]   And   timolol (TIMOPTIC) 0.5 % ophthalmic solution 1 drop  1 drop Left Eye BID Orma Flaming, MD   1 drop at 03/14/23 [id number]   Chlorhexidine Gluconate Cloth 2 % PADS 6 each  6 each Topical Daily Mercy Riding, MD   6 each at 03/14/23 0953   dexamethasone (DECADRON) injection 2 mg  2 mg Intravenous Q12H Dessa Phi, DO   2 mg at 03/14/23 0946   dorzolamide (TRUSOPT) 2 % ophthalmic solution 1 drop  1 drop Left Eye BID Orma Flaming, MD   1 drop at 03/14/23 [id number]   Gerhardt's butt cream   Topical BID Wendee Beavers T, MD       insulin aspart (novoLOG) injection 0-9 Units  0-9 Units Subcutaneous TID WC Orma Flaming, MD   1 Units at 03/14/23 1311   metoprolol tartrate (LOPRESSOR) tablet 25 mg  25 mg Oral BID Wendee Beavers T, MD   25 mg at 03/14/23 0946   ofloxacin (OCUFLOX) 0.3 % ophthalmic solution 1 drop  1 drop Right Eye QID Orma Flaming, MD   1 drop at 03/14/23 1312   Oral care mouth rinse  15 mL Mouth Rinse PRN Dessa Phi, DO       potassium PHOSPHATE 15 mmol in dextrose 5 % 250 mL infusion  15 mmol Intravenous Once Wendee Beavers T, MD 43 mL/hr at 03/14/23 0953 15 mmol at 03/14/23 0953   prednisoLONE acetate (PRED FORTE) 1 % ophthalmic suspension 1 drop  1 drop Right Eye QID Orma Flaming, MD   1 drop at 03/14/23 1314   Tafamidis CAPS 61 mg  61 mg Oral Daily Orma Flaming, MD   61 mg at 03/14/23 [id number]     Discharge Medications: Please see discharge summary for a list of discharge medications.  Relevant Imaging Results:  Relevant Lab Results:   Additional Information [phone]  Milas Gain, LCSWA

## 2023-03-15 DIAGNOSIS — G9340 Encephalopathy, unspecified: Secondary | ICD-10-CM | POA: Diagnosis not present

## 2023-03-15 DIAGNOSIS — E875 Hyperkalemia: Secondary | ICD-10-CM

## 2023-03-15 DIAGNOSIS — D696 Thrombocytopenia, unspecified: Secondary | ICD-10-CM

## 2023-03-15 DIAGNOSIS — D72829 Elevated white blood cell count, unspecified: Secondary | ICD-10-CM | POA: Diagnosis not present

## 2023-03-15 DIAGNOSIS — I4891 Unspecified atrial fibrillation: Secondary | ICD-10-CM | POA: Diagnosis not present

## 2023-03-15 DIAGNOSIS — I10 Essential (primary) hypertension: Secondary | ICD-10-CM | POA: Diagnosis not present

## 2023-03-15 LAB — RENAL FUNCTION PANEL
Albumin: 1.8 g/dL — ABNORMAL LOW (ref 3.5–5.0)
Anion gap: 8 (ref 5–15)
BUN: 16 mg/dL (ref 8–23)
CO2: 23 mmol/L (ref 22–32)
Calcium: 8.7 mg/dL — ABNORMAL LOW (ref 8.9–10.3)
Chloride: 108 mmol/L (ref 98–111)
Creatinine, Ser: 0.63 mg/dL (ref 0.44–1.00)
GFR, Estimated: >60 mL/min (ref >60)
Glucose, Bld: 162 mg/dL — ABNORMAL HIGH (ref 70–99)
Phosphorus: 2.7 mg/dL (ref 2.5–4.6)
Potassium: 5.4 mmol/L — ABNORMAL HIGH (ref 3.5–5.1)
Sodium: 139 mmol/L (ref 135–145)

## 2023-03-15 LAB — GLUCOSE, CAPILLARY
Glucose-Capillary: 132 mg/dL — ABNORMAL HIGH (ref 70–99)
Glucose-Capillary: 158 mg/dL — ABNORMAL HIGH (ref 70–99)
Glucose-Capillary: 164 mg/dL — ABNORMAL HIGH (ref 70–99)
Glucose-Capillary: 144 mg/dL — ABNORMAL HIGH (ref 70–99)

## 2023-03-15 LAB — CBC
HCT: 32.4 % — ABNORMAL LOW (ref 36.0–46.0)
Hemoglobin: 11.2 g/dL — ABNORMAL LOW (ref 12.0–15.0)
MCH: 29.6 pg (ref 26.0–34.0)
MCHC: 34.6 g/dL (ref 30.0–36.0)
MCV: 85.5 fL (ref 80.0–100.0)
Platelets: 88 10*3/uL — ABNORMAL LOW (ref 150–400)
RBC: 3.79 MIL/uL — ABNORMAL LOW (ref 3.87–5.11)
RDW: 17.3 % — ABNORMAL HIGH (ref 11.5–15.5)
WBC: 6.3 10*3/uL (ref 4.0–10.5)
nRBC: 0 % (ref 0.0–0.2)

## 2023-03-15 LAB — MAGNESIUM: Magnesium: 1.8 mg/dL (ref 1.7–2.4)

## 2023-03-15 MED ORDER — SODIUM ZIRCONIUM CYCLOSILICATE 10 G PO PACK
10.0000 g | PACK | Freq: Once | ORAL | Status: AC
  Administered 2023-03-15: 10 g via ORAL
  Filled 2023-03-15: qty 1

## 2023-03-15 MED ORDER — METOPROLOL TARTRATE 25 MG PO TABS
37.5000 mg | ORAL_TABLET | Freq: Two times a day (BID) | ORAL | Status: DC
  Administered 2023-03-15 – 2023-03-16 (×2): 37.5 mg via ORAL
  Filled 2023-03-15 (×2): qty 1

## 2023-03-15 MED ORDER — DEXAMETHASONE SODIUM PHOSPHATE 4 MG/ML IJ SOLN
2.0000 mg | INTRAMUSCULAR | Status: DC
  Administered 2023-03-16 – 2023-03-21 (×6): 2 mg via INTRAVENOUS
  Filled 2023-03-15 (×7): qty 0.5

## 2023-03-15 NOTE — TOC Progression Note (Addendum)
Transition of Care [facility]) - Progression Note    Patient Details  Name: Amanda Green MRN: [id number] Date of Birth: 01-21-1935  [facility]) CM/SW [place], [place] Phone Number: 03/15/2023, 10:13 AM  Clinical Narrative:     Patients passr is pending. CSW submitted requested clinicals to [place] must for review. CSW provided patients daughter with SNF bed offers. Patients daughter confirmed she will review and give CSW call back with SNF choice.CSW will continue to follow and assist with patients dc planning needs.  Patients PASSR number approved. CSW added passr to patients FL2.  Update CSW received call from patients daughter [name] who accepted SNF bed offer for patient with [facility]. Juliann Pulse with [facility] confirmed SNF bed and informed CSW that patient can dc over the weekend if medically ready. CSW started insurance authorization for patient. Reference # [id number]. TOC will continue to follow.    Barriers to Discharge: Continued Medical Work up  Expected Discharge Plan and Services In-house Referral: Clinical Social Work     Living arrangements for the past 2 months: Single Family Home                                       Social Determinants of Health (SDOH) Interventions SDOH Screenings   Food Insecurity: No Food Insecurity (03/10/2023)  Housing: Low Risk  (03/10/2023)  Transportation Needs: No Transportation Needs (03/10/2023)  Utilities: Not At Risk (03/10/2023)  Tobacco Use: Low Risk  (03/09/2023)    Readmission Risk Interventions     No data to display

## 2023-03-15 NOTE — Progress Notes (Signed)
PROGRESS NOTE  Amanda Green [id number] DOB: 21-Feb-1935   PCP: Nicholas Lose, MD  Patient is from: Home  DOA: 03/09/2023 LOS: 6  Chief complaints Chief Complaint  Patient presents with   Leg Swelling     Brief Narrative / Interim history: 88 year old F with PMH of BRCA with brain mets s/p bilateral mastectomy, DM-2, dementia, HTN, CVA and cardiac amyloidosis presenting with shortness of breath and edema and admitted for A-fib with RVR, acute diastolic CHF and AKI.  Patient was started on IV Lasix for CHF.  Cardiology consulted and started on digoxin for rate control due to marginal blood pressures.  However, digoxin held due to AKI.  Diuretics held due to soft blood pressures.  Patient continued to decline despite all interventions.  Continued to be fatigued and lethargic.  Palliative medicine consulted.  Patient and family wanted to continue full code with full scope of care.  Lethargy, A-fib and hypotension improving.  Therapy recommended SNF.  Hospital course complicated by acute urinary retention requiring Foley catheter.   Subjective: Seen and examined earlier this morning.  No major events overnight of this morning.  No complaints.  She denies pain, shortness of breath, nausea or vomiting.  Daughters at bedside.  Remains in A-fib but HR improved, in the range of 90s to 110s.  Objective: Vitals:   03/15/23 0753 03/15/23 0800 03/15/23 0821 03/15/23 1150  BP: 113/79   113/72  Pulse: (!) 111   100  Resp:  20 20   Temp: 97.7 F (36.5 C)   98.5 F (36.9 C)  TempSrc: Oral   Axillary  SpO2:  98%  98%  Weight:      Height:        Examination:  GENERAL: No apparent distress.  Nontoxic. HEENT: MMM.  Vision and hearing grossly intact.  NECK: Supple.  No apparent JVD.  RESP:  No IWOB.  Fair aeration bilaterally. CVS: Irregular rhythm.  HR 90s to 110s.  Heart sounds normal.  ABD/GI/GU: BS+. Abd soft, NTND.  MSK/EXT:   No apparent deformity. Moves extremities.  Trace BLE  edema. SKIN: no apparent skin lesion or wound NEURO: Awake and alert. Oriented fairly.  No apparent focal neuro deficit. PSYCH: Calm. Normal affect.   Procedures:  None  Microbiology summarized: [id number], influenza and RSV PCR nonreactive.  Assessment and plan: Principal Problem:   Atrial fibrillation with RVR (HCC) Active Problems:   Acute on chronic diastolic CHF (congestive heart failure) (HCC)   Hyponatremia   Elevated troponin   Ductal carcinoma in situ (DCIS) of left breast   Cardiac amyloidosis (HCC)   Controlled type 2 diabetes mellitus without complication, without long-term current use of insulin (HCC)   Essential hypertension   Cognitive impairment   Leukocytosis   Encephalopathy acute  A-fib with RVR: Initially started on Cardizem drip that was discontinued due to hypotension.  Eliquis discontinued due to concern for brain bleed in the setting of worsening mentation and brain mets.  Digoxin held due to AKI but AKI resolved now.  Hypotension resolved as well.  Cardiology recommended continuing metoprolol and signed off. -Increase p.o. metoprolol to 37.5 mg twice daily -Optimize electrolytes -Palliative medicine following.   Acute on chronic diastolic CHF: Exam with some generalized edema.  Does not seems to have respiratory distress. Cardiac amyloidosis  -Continue tafamidis -Diuretics on hold due to AKI and hypotension. -Strict I's and O's, daily weight, fluid restriction -Ted hose    AKI: Likely prerenal from hypotension and diuretics.  Renal  US negative.  Resolved. Recent Labs    02/23/23 2225 03/01/23 1004 03/06/23 2243 03/09/23 1402 03/10/23 0233 03/11/23 0156 03/12/23 0231 03/13/23 0232 03/14/23 0139 03/15/23 0954  BUN 49* 31* 30* 26* 32* 45* 59* 43* 27* 16  CREATININE 1.20* 0.83 0.91 0.89 0.90 1.36* 1.89* 1.00 0.75 0.63  -Continue holding nephrotoxic  Acute urinary retention/hematuria: Seems like she has significant urinary tension with bladder  over distention the night of 3/26.  Hematuria likely traumatic from I and O cath.  Renal US negative. -Continue Foley catheter for 7 to 10 days   Acute metabolic encephalopathy/underlying dementia: Awake, alert and oriented to self, place and family.  CT head without acute finding.  Recent MRI with brain mass concerning for metastasis -Reorientation and delirium precaution -Continue home Namenda.  Demand ischemia: In setting of A Fib RVR and CHF    DCIS left breast with mets to brain -Decrease Decadron to 2 mg daily as previously planned -Per oncology and radiation oncology   Controlled DM-2 with hyperglycemia: A1c 6.5% in 01/2023. Recent Labs  Lab 03/14/23 1215 03/14/23 1600 03/14/23 2044 03/15/23 0752 03/15/23 1148  GLUCAP 146* 177* 184* 132* 158*  -Continue current insulin regimen  Essential hypertension: Normotensive this morning. -Low-dose metoprolol as above.   Goals of care: Patient with multiple complicated comorbidities as above.  Poor long-term prognosis.  Still full code. -Palliative medicine following  Thrombocytopenia: Slowly dropping.  Recent Labs  Lab 03/09/23 1204 03/10/23 0233 03/11/23 0156 03/13/23 0232 03/14/23 0139 03/15/23 1105  PLT 149* 160 172 116* 107* 88*  -Check HIT labs  Mild hyperkalemia: -P.o. Lokelma 10 g x1  Physical deconditioning -PT/OT-final disposition SNF.  Body mass index is 27.3 kg/m.  Pressure skin injury: POA Pressure Injury 03/09/23 Buttocks Right Stage 2 -  Partial thickness loss of dermis presenting as a shallow open injury with a red, pink wound bed without slough. less than 1cm size. pink/red (Active)  03/09/23 1854  Location: Buttocks  Location Orientation: Right  Staging: Stage 2 -  Partial thickness loss of dermis presenting as a shallow open injury with a red, pink wound bed without slough.  Wound Description (Comments): less than 1cm size. pink/red  Present on Admission: Yes  Dressing Type Foam - Lift dressing  to assess site every shift 03/15/23 [id number]   DVT prophylaxis:  Place and maintain sequential compression device Start: 03/13/23 1058 Place TED hose Start: 03/12/23 1159 Place TED hose Start: 03/09/23 1723  Code Status: Full code Family Communication: Updated patient's daughters at bedside. Level of care: Telemetry Medical Status is: Inpatient Remains inpatient appropriate because: A-fib with RVR   Final disposition: SNF Consultants:  Cardiology Palliative medicine  55 minutes with more than 50% spent in reviewing records, counseling patient/family and coordinating care.   Sch Meds:  Scheduled Meds:  brimonidine  1 drop Left Eye BID   And   timolol  1 drop Left Eye BID   Chlorhexidine Gluconate Cloth  6 each Topical Daily   [START ON 03/16/2023] dexamethasone (DECADRON) injection  2 mg Intravenous Q24H   dorzolamide  1 drop Left Eye BID   Gerhardt's butt cream   Topical BID   insulin aspart  0-9 Units Subcutaneous TID WC   metoprolol tartrate  37.5 mg Oral BID   ofloxacin  1 drop Right Eye QID   prednisoLONE acetate  1 drop Right Eye QID   sodium zirconium cyclosilicate  10 g Oral Once   Tafamidis  61 mg Oral Daily  Continuous Infusions:  sodium chloride 5 mL/hr at 03/14/23 1753   PRN Meds:.sodium chloride, acetaminophen **OR** acetaminophen, mouth rinse  Antimicrobials: Anti-infectives (From admission, onward)    None        I have personally reviewed the following labs and images: CBC: Recent Labs  Lab 03/09/23 1204 03/09/23 1214 03/10/23 0233 03/11/23 0156 03/13/23 0232 03/14/23 0139 03/15/23 1105  WBC 12.3*  --  10.8* 10.3 7.5 6.7 6.3  NEUTROABS 11.2*  --   --   --   --   --   --   HGB 14.0   < > 13.2 14.0 12.2 11.5* 11.2*  HCT 42.9   < > 38.3 40.0 34.1* 32.3* 32.4*  MCV 89.9  --  84.5 83.2 83.2 83.7 85.5  PLT 149*  --  160 172 116* 107* 88*   < > = values in this interval not displayed.   BMP &GFR Recent Labs  Lab 03/09/23 1943  03/10/23 0233 03/11/23 0156 03/12/23 0231 03/13/23 0232 03/14/23 0139 03/15/23 0954  NA  --    < > 124* 124* 137 137 139  K  --    < > 4.2 4.0 3.5 3.9 5.4*  CL  --    < > 95* 95* 105 110 108  CO2  --    < > 22 22 24 24 23   GLUCOSE  --    < > 156* 110* 73 122* 162*  BUN  --    < > 45* 59* 43* 27* 16  CREATININE  --    < > 1.36* 1.89* 1.00 0.75 0.63  CALCIUM  --    < > 9.0 8.8* 8.6* 8.5* 8.7*  MG 2.1  --  2.2  --  2.1 2.0 1.8  PHOS  --   --   --   --   --  2.1* 2.7   < > = values in this interval not displayed.   Estimated Creatinine Clearance: 47.3 mL/min (by C-G formula based on SCr of 0.63 mg/dL). Liver & Pancreas: Recent Labs  Lab 03/09/23 1402 03/14/23 0139 03/15/23 0954  AST 34  --   --   ALT 27  --   --   ALKPHOS 50  --   --   BILITOT 1.3*  --   --   PROT 4.7*  --   --   ALBUMIN 2.4* 1.8* 1.8*   No results for input(s): "LIPASE", "AMYLASE" in the last 168 hours.  No results for input(s): "AMMONIA" in the last 168 hours. Diabetic: No results for input(s): "HGBA1C" in the last 72 hours. Recent Labs  Lab 03/14/23 1215 03/14/23 1600 03/14/23 2044 03/15/23 0752 03/15/23 1148  GLUCAP 146* 177* 184* 132* 158*   Cardiac Enzymes: No results for input(s): "CKTOTAL", "CKMB", "CKMBINDEX", "TROPONINI" in the last 168 hours. No results for input(s): "PROBNP" in the last 8760 hours. Coagulation Profile: Recent Labs  Lab 03/09/23 1943  INR 1.7*   Thyroid Function Tests: No results for input(s): "TSH", "T4TOTAL", "FREET4", "T3FREE", "THYROIDAB" in the last 72 hours. Lipid Profile: No results for input(s): "CHOL", "HDL", "LDLCALC", "TRIG", "CHOLHDL", "LDLDIRECT" in the last 72 hours. Anemia Panel: No results for input(s): "VITAMINB12", "FOLATE", "FERRITIN", "TIBC", "IRON", "RETICCTPCT" in the last 72 hours. Urine analysis:    Component Value Date/Time   COLORURINE YELLOW 03/10/2023 0424   APPEARANCEUR CLEAR 03/10/2023 0424   LABSPEC 1.031 (H) 03/10/2023 0424    PHURINE 7.0 03/10/2023 0424   GLUCOSEU NEGATIVE 03/10/2023 0424  HGBUR SMALL (A) 03/10/2023 0424   BILIRUBINUR NEGATIVE 03/10/2023 0424   KETONESUR NEGATIVE 03/10/2023 0424   PROTEINUR NEGATIVE 03/10/2023 0424   UROBILINOGEN 0.2 03/23/2010 1156   NITRITE NEGATIVE 03/10/2023 0424   LEUKOCYTESUR NEGATIVE 03/10/2023 0424   Sepsis Labs: Invalid input(s): "PROCALCITONIN", "LACTICIDVEN"  Microbiology: Recent Results (from the past 240 hour(s))  Resp panel by RT-PCR (RSV, Flu A&B, Covid) Anterior Nasal Swab     Status: None   Collection Time: 03/09/23 11:34 AM   Specimen: Anterior Nasal Swab  Result Value Ref Range Status   SARS Coronavirus 2 by RT PCR NEGATIVE NEGATIVE Final   Influenza A by PCR NEGATIVE NEGATIVE Final   Influenza B by PCR NEGATIVE NEGATIVE Final    Comment: (NOTE) The Xpert Xpress SARS-CoV-2/FLU/RSV plus assay is intended as an aid in the diagnosis of influenza from Nasopharyngeal swab specimens and should not be used as a sole basis for treatment. Nasal washings and aspirates are unacceptable for Xpert Xpress SARS-CoV-2/FLU/RSV testing.  Fact Sheet for Patients: EntrepreneurPulse.com.au  Fact Sheet for Healthcare Providers: IncredibleEmployment.be  This test is not yet approved or cleared by the [place] FDA and has been authorized for detection and/or diagnosis of SARS-CoV-2 by FDA under an Emergency Use Authorization (EUA). This EUA will remain in effect (meaning this test can be used) for the duration of the COVID-19 declaration under Section 564(b)(1) of the Act, 21 U.S.C. section 360bbb-3(b)(1), unless the authorization is terminated or revoked.     Resp Syncytial Virus by PCR NEGATIVE NEGATIVE Final    Comment: (NOTE) Fact Sheet for Patients: EntrepreneurPulse.com.au  Fact Sheet for Healthcare Providers: IncredibleEmployment.be  This test is not yet approved or cleared by  the [place] FDA and has been authorized for detection and/or diagnosis of SARS-CoV-2 by FDA under an Emergency Use Authorization (EUA). This EUA will remain in effect (meaning this test can be used) for the duration of the COVID-19 declaration under Section 564(b)(1) of the Act, 21 U.S.C. section 360bbb-3(b)(1), unless the authorization is terminated or revoked.  Performed at [facility], [place] [address], [place], [place] [address]     Radiology Studies: No results found.    Adylin Hankey T. Cornelius  If 7PM-7AM, please contact night-coverage [email or web address] 03/15/2023, 1:00 PM

## 2023-03-16 DIAGNOSIS — D72829 Elevated white blood cell count, unspecified: Secondary | ICD-10-CM | POA: Diagnosis not present

## 2023-03-16 DIAGNOSIS — G9340 Encephalopathy, unspecified: Secondary | ICD-10-CM | POA: Diagnosis not present

## 2023-03-16 DIAGNOSIS — I10 Essential (primary) hypertension: Secondary | ICD-10-CM | POA: Diagnosis not present

## 2023-03-16 DIAGNOSIS — I4891 Unspecified atrial fibrillation: Secondary | ICD-10-CM | POA: Diagnosis not present

## 2023-03-16 LAB — RENAL FUNCTION PANEL
Albumin: 1.8 g/dL — ABNORMAL LOW (ref 3.5–5.0)
Anion gap: 6 (ref 5–15)
BUN: 16 mg/dL (ref 8–23)
CO2: 24 mmol/L (ref 22–32)
Calcium: 8.7 mg/dL — ABNORMAL LOW (ref 8.9–10.3)
Chloride: 110 mmol/L (ref 98–111)
Creatinine, Ser: 0.67 mg/dL (ref 0.44–1.00)
GFR, Estimated: >60 mL/min (ref >60)
Glucose, Bld: 130 mg/dL — ABNORMAL HIGH (ref 70–99)
Phosphorus: 2.2 mg/dL — ABNORMAL LOW (ref 2.5–4.6)
Potassium: 3.8 mmol/L (ref 3.5–5.1)
Sodium: 140 mmol/L (ref 135–145)

## 2023-03-16 LAB — DIC (DISSEMINATED INTRAVASCULAR COAGULATION)PANEL
D-Dimer, Quant: 11.36 ug/mL-FEU — ABNORMAL HIGH (ref 0.00–0.50)
Fibrinogen: 386 mg/dL (ref 210–475)
INR: 1.1 (ref 0.8–1.2)
Platelets: 90 10*3/uL — ABNORMAL LOW (ref 150–400)
Prothrombin Time: 13.6 seconds (ref 11.4–15.2)
Smear Review: NONE SEEN
aPTT: 24 seconds (ref 24–36)

## 2023-03-16 LAB — CBC
HCT: 34.2 % — ABNORMAL LOW (ref 36.0–46.0)
Hemoglobin: 11.8 g/dL — ABNORMAL LOW (ref 12.0–15.0)
MCH: 29.3 pg (ref 26.0–34.0)
MCHC: 34.5 g/dL (ref 30.0–36.0)
MCV: 84.9 fL (ref 80.0–100.0)
Platelets: 53 10*3/uL — ABNORMAL LOW (ref 150–400)
RBC: 4.03 MIL/uL (ref 3.87–5.11)
RDW: 17.4 % — ABNORMAL HIGH (ref 11.5–15.5)
WBC: 5 10*3/uL (ref 4.0–10.5)
nRBC: 0 % (ref 0.0–0.2)

## 2023-03-16 LAB — GLUCOSE, CAPILLARY
Glucose-Capillary: 107 mg/dL — ABNORMAL HIGH (ref 70–99)
Glucose-Capillary: 155 mg/dL — ABNORMAL HIGH (ref 70–99)
Glucose-Capillary: 133 mg/dL — ABNORMAL HIGH (ref 70–99)
Glucose-Capillary: 92 mg/dL (ref 70–99)

## 2023-03-16 LAB — LACTATE DEHYDROGENASE: LDH: 224 U/L — ABNORMAL HIGH (ref 98–192)

## 2023-03-16 LAB — MAGNESIUM: Magnesium: 1.8 mg/dL (ref 1.7–2.4)

## 2023-03-16 LAB — FOLATE: Folate: 21.1 ng/mL (ref >5.9)

## 2023-03-16 LAB — VITAMIN B12: Vitamin B-12: 1668 pg/mL — ABNORMAL HIGH (ref 180–914)

## 2023-03-16 LAB — IMMATURE PLATELET FRACTION: Immature Platelet Fraction: 2.8 % (ref 1.2–8.6)

## 2023-03-16 MED ORDER — METOPROLOL TARTRATE 25 MG PO TABS
25.0000 mg | ORAL_TABLET | Freq: Three times a day (TID) | ORAL | Status: DC
  Administered 2023-03-16 – 2023-03-18 (×6): 25 mg via ORAL
  Filled 2023-03-16 (×6): qty 1

## 2023-03-16 NOTE — Progress Notes (Signed)
Hematology/Oncology Progress Note  Clinical Summary: [name] is an 88 year old female with medical history significant for metastatic breast cancer currently followed by Dr. Chryl Green who is currently admitted with atrial fibrillation with RVR as well as acute diastolic congestive heart failure and AKI.  Interval History: -- Patient has had a steady decline in her platelets with a platelet count of 172 on 03/11/2023 dropping down to 107 on 03/14/2023, 88 yesterday and 53 today. --patient resting comfortably with family at bedside --denies bleeding, bruising or dark stools. Urine light yellow in foley bag --denies nausea/vomiting/diarrhea.   O:  Vitals:   03/16/23 1649 03/16/23 2102  BP: 109/73 (!) 113/59  Pulse: (!) 110 (!) 113  Resp:    Temp:    SpO2:        Latest Ref Rng & Units 03/16/2023    2:08 AM 03/15/2023    9:54 AM 03/14/2023    1:39 AM  CMP  Glucose 70 - 99 mg/dL 130  162  122   BUN 8 - 23 mg/dL 16  16  27    Creatinine 0.44 - 1.00 mg/dL 0.67  0.63  0.75   Sodium 135 - 145 mmol/L 140  139  137   Potassium 3.5 - 5.1 mmol/L 3.8  5.4  3.9   Chloride 98 - 111 mmol/L 110  108  110   CO2 22 - 32 mmol/L 24  23  24    Calcium 8.9 - 10.3 mg/dL 8.7  8.7  8.5       Latest Ref Rng & Units 03/16/2023    5:50 PM 03/16/2023    2:08 AM 03/15/2023   11:05 AM  CBC  WBC 4.0 - 10.5 K/uL  5.0  6.3   Hemoglobin 12.0 - 15.0 g/dL  11.8  11.2   Hematocrit 36.0 - 46.0 %  34.2  32.4   Platelets 150 - 400 K/uL 90  53  88       GENERAL: well appearing elderly African American female in NAD  SKIN: skin color, texture, turgor are normal, no rashes or significant lesions EYES: conjunctiva are pink and non-injected, sclera clear LUNGS: clear to auscultation and percussion with normal breathing effort HEART: regular rate & rhythm and no murmurs and no lower extremity edema Musculoskeletal: no cyanosis of digits and no clubbing  PSYCH: alert & oriented x 3, fluent speech NEURO: no focal  motor/sensory deficits  Assessment/Plan:  # Thrombocytopenia, Unclear Etiology -- Etiology is unclear, the patient had a platelet count that was perfectly normal on 03/11/2023 at 172. -- Will perform nutritional labs to include vitamin B12, folate, methylmalonic acid -- Will order an immature platelet fraction.  HIT antibody testing is pending, though low clinical suspicion for HIT at this time --Anticoagulation therapy has been discontinued the patient has not been on heparin therapy in the hospital. -- May potentially represent a consumptive process.  Will order DIC panel.  Additionally patient has been having urinary bleeding which may be driving down the platelet count. -- Hematology service will continue to follow.  # Metastatic Breast Cancer # Brain Metastasis -- Patient is not currently on chemotherapy, currently under the care of Dr. Chryl Green.  -- Will defer breast cancer treatment in the outpatient setting to Dr. Chryl Green.   Ledell Peoples, MD Department of Hematology/Oncology [place] at [facility] Phone: [phone] Pager: [phone] Email: [email or web address]

## 2023-03-16 NOTE — TOC Progression Note (Addendum)
Transition of Care [facility]) - Progression Note    Patient Details  Name: Amanda Green MRN: [id number] Date of Birth: [birth year]  [facility] CM/SW Silt, LCSW Phone Number: 03/16/2023, 4:19 PM  Clinical Narrative:     CSW received a call from [facility] rep-Megan asking if pt had any more wounds besides the stage 2 wound and if there was any more information that the medical director needed to know in regards to her SNF request. CSW alerted MD and RN.  RN informed CSW that pt only had the stage 2 wounds.  CSW followed up with Jinny Blossom and she stated that she needed to send to medical director for further review.  TOC team will continue to assist with discharge planning needs.    Barriers to Discharge: Continued Medical Work up  Expected Discharge Plan and Services In-house Referral: Clinical Social Work     Living arrangements for the past 2 months: Single Family Home                                       Social Determinants of Health (SDOH) Interventions SDOH Screenings   Food Insecurity: No Food Insecurity (03/10/2023)  Housing: Low Risk  (03/10/2023)  Transportation Needs: No Transportation Needs (03/10/2023)  Utilities: Not At Risk (03/10/2023)  Tobacco Use: Low Risk  (03/09/2023)    Readmission Risk Interventions   No data to display

## 2023-03-16 NOTE — Progress Notes (Signed)
PROGRESS NOTE  Amanda Green [id number] DOB: [birth year]   PCP: Nicholas Lose, MD  Patient is from: Home  DOA: 03/09/2023 LOS: 7  Chief complaints Chief Complaint  Patient presents with   Leg Swelling     Brief Narrative / Interim history: 88 year old F with PMH of BRCA with brain mets s/p bilateral mastectomy, DM-2, dementia, HTN, CVA and cardiac amyloidosis presenting with shortness of breath and edema and admitted for A-fib with RVR, acute diastolic CHF and AKI.  Patient was started on IV Lasix for CHF.  Cardiology consulted and started on digoxin for rate control due to marginal blood pressures.  However, digoxin held due to AKI.  Diuretics held due to soft blood pressures.  Patient continued to decline despite all interventions.  Continued to be fatigued and lethargic.  Palliative medicine consulted.  Patient and family wanted to continue full code with full scope of care.  Lethargy, A-fib and hypotension improving.  Still with mild RVR.  Adjusting meds.  Oncology consulted.   Hospital course complicated by acute urinary retention and thrombocytopenia.  Foley catheter placed.  Oncology consulted for thrombocytopenia.  Therapy recommended SNF.  TOC following.   Subjective: Seen and examined earlier this morning.  No major events overnight of this morning.  Patient has no complaints.  Remains in RVR with HR ranging from 90s to 120s.  Pia worse today.  H&H relatively stable.  No signs of major bleeding per patient and family.   Objective: Vitals:   03/15/23 2343 03/16/23 0419 03/16/23 0748 03/16/23 0822  BP: 116/74 116/81 121/77 112/73  Pulse: 88 96 (!) 119   Resp: 17 (!) 22 (!) 23   Temp: 97.8 F (36.6 C) 97.6 F (36.4 C) 97.9 F (36.6 C)   TempSrc: Oral Oral Oral   SpO2: 99% 98% 99%   Weight:  71.2 kg    Height:        Examination:  GENERAL: No apparent distress.  Nontoxic. HEENT: MMM.  Vision and hearing grossly intact.  NECK: Supple.  No apparent JVD.   RESP:  No IWOB.  Fair aeration bilaterally. CVS: Irregular rhythm.  HR ranges from 90s to 120s.  Heart sounds normal.  ABD/GI/GU: BS+. Abd soft, NTND.  MSK/EXT:   No apparent deformity. Moves extremities but very weak. SKIN: no apparent skin lesion or wound NEURO: Awake and alert. Oriented appropriately.  No apparent focal neuro deficit. PSYCH: Calm. Normal affect.    Procedures:  None  Microbiology summarized: [id number], influenza and RSV PCR nonreactive.  Assessment and plan: Principal Problem:   Atrial fibrillation with RVR (HCC) Active Problems:   Acute on chronic diastolic CHF (congestive heart failure) (HCC)   Hyponatremia   Elevated troponin   Ductal carcinoma in situ (DCIS) of left breast   Cardiac amyloidosis (HCC)   Controlled type 2 diabetes mellitus without complication, without long-term current use of insulin (HCC)   Essential hypertension   Cognitive impairment   Leukocytosis   Encephalopathy acute  A-fib with RVR: Initially started on Cardizem drip that was discontinued due to hypotension.  Eliquis discontinued due to concern for brain bleed in the setting of worsening mentation and brain mets.  Digoxin held due to AKI but AKI resolved now.  Hypotension resolved as well.  Cardiology recommended continuing metoprolol and signed off. -Change metoprolol from 37.5 mg twice daily to 25 mg 3 times daily -Optimize electrolytes -Palliative medicine following.   Acute on chronic diastolic CHF: Exam with some generalized edema.  Does not  seems to have respiratory distress. Cardiac amyloidosis  -Continue tafamidis -Diuretics on hold due to AKI and hypotension. -Strict I's and O's, daily weight, fluid restriction -Ted hose    AKI: Likely prerenal from hypotension and diuretics.  Renal US negative.  Resolved. Recent Labs    03/01/23 1004 03/06/23 2243 03/09/23 1402 03/10/23 0233 03/11/23 0156 03/12/23 0231 03/13/23 0232 03/14/23 0139 03/15/23 0954  03/16/23 0208  BUN 31* 30* 26* 32* 45* 59* 43* 27* 16 16  CREATININE 0.83 0.91 0.89 0.90 1.36* 1.89* 1.00 0.75 0.63 0.67  -Continue holding nephrotoxic  Acute urinary retention/hematuria: Seems like she has significant urinary tension with bladder over distention the night of 3/26.  Hematuria likely traumatic from I and O cath.  Renal US negative. -Continue Foley catheter for 7 to 10 days   Acute metabolic encephalopathy/underlying dementia: Awake, alert and oriented to self, place and family.  CT head without acute finding.  Recent MRI with brain mass concerning for metastasis -Reorientation and delirium precaution -Continue home Namenda.  Demand ischemia: In setting of A Fib RVR and CHF    Breast cancer with brain mets s/p brain radiation.  Last radiation on 3/20.  -Decrease Decadron to 2 mg daily as previously planned -Per oncology and radiation oncology   Controlled DM-2 with hyperglycemia: A1c 6.5% in 01/2023. Recent Labs  Lab 03/15/23 1148 03/15/23 1617 03/15/23 2102 03/16/23 0747 03/16/23 1237  GLUCAP 158* 164* 144* 107* 155*  -Continue current insulin regimen  Essential hypertension: Normotensive this morning. -Low-dose metoprolol as above.   Goals of care: Patient with multiple complicated comorbidities as above.  Poor long-term prognosis.  Still full code. -Palliative medicine following  Thrombocytopenia: Slowly dropping.  Has not received heparin or heparin products.  Not on chemo or other medication that could contribute to thrombocytopenia as far as I can tell. Recent Labs  Lab 03/10/23 0233 03/11/23 0156 03/13/23 0232 03/14/23 0139 03/15/23 1105 03/16/23 0208  PLT 160 172 116* 107* 88* 53*  -Hematology/oncology consulted  Mild hyperkalemia: Resolved  Physical deconditioning -PT/OT-final disposition SNF.  Body mass index is 27.81 kg/m.  Pressure skin injury: POA Pressure Injury 03/09/23 Buttocks Right Stage 2 -  Partial thickness loss of dermis  presenting as a shallow open injury with a red, pink wound bed without slough. less than 1cm size. pink/red (Active)  03/09/23 1854  Location: Buttocks  Location Orientation: Right  Staging: Stage 2 -  Partial thickness loss of dermis presenting as a shallow open injury with a red, pink wound bed without slough.  Wound Description (Comments): less than 1cm size. pink/red  Present on Admission: Yes  Dressing Type Foam - Lift dressing to assess site every shift 03/16/23 [id number]   DVT prophylaxis:  Place and maintain sequential compression device Start: 03/13/23 1058 Place TED hose Start: 03/12/23 1159 Place TED hose Start: 03/09/23 1723  Code Status: Full code Family Communication: Updated patient's daughters at bedside. Level of care: Telemetry Medical Status is: Inpatient Remains inpatient appropriate because: A-fib with RVR, thrombocytopenia   Final disposition: SNF Consultants:  Cardiology Palliative medicine Hematology/oncology  55 minutes with more than 50% spent in reviewing records, counseling patient/family and coordinating care.   Sch Meds:  Scheduled Meds:  brimonidine  1 drop Left Eye BID   And   timolol  1 drop Left Eye BID   Chlorhexidine Gluconate Cloth  6 each Topical Daily   dexamethasone (DECADRON) injection  2 mg Intravenous Q24H   dorzolamide  1 drop Left Eye BID  Gerhardt's butt cream   Topical BID   insulin aspart  0-9 Units Subcutaneous TID WC   metoprolol tartrate  25 mg Oral TID   ofloxacin  1 drop Right Eye QID   prednisoLONE acetate  1 drop Right Eye QID   Tafamidis  61 mg Oral Daily   Continuous Infusions:  sodium chloride 5 mL/hr at 03/14/23 1753   PRN Meds:.sodium chloride, acetaminophen **OR** acetaminophen, mouth rinse  Antimicrobials: Anti-infectives (From admission, onward)    None        I have personally reviewed the following labs and images: CBC: Recent Labs  Lab 03/11/23 0156 03/13/23 0232 03/14/23 0139 03/15/23 1105  03/16/23 0208  WBC 10.3 7.5 6.7 6.3 5.0  HGB 14.0 12.2 11.5* 11.2* 11.8*  HCT 40.0 34.1* 32.3* 32.4* 34.2*  MCV 83.2 83.2 83.7 85.5 84.9  PLT 172 116* 107* 88* 53*   BMP &GFR Recent Labs  Lab 03/11/23 0156 03/12/23 0231 03/13/23 0232 03/14/23 0139 03/15/23 0954 03/16/23 0208  NA 124* 124* 137 137 139 140  K 4.2 4.0 3.5 3.9 5.4* 3.8  CL 95* 95* 105 110 108 110  CO2 22 22 24 24 23 24   GLUCOSE 156* 110* 73 122* 162* 130*  BUN 45* 59* 43* 27* 16 16  CREATININE 1.36* 1.89* 1.00 0.75 0.63 0.67  CALCIUM 9.0 8.8* 8.6* 8.5* 8.7* 8.7*  MG 2.2  --  2.1 2.0 1.8 1.8  PHOS  --   --   --  2.1* 2.7 2.2*   Estimated Creatinine Clearance: 47.7 mL/min (by C-G formula based on SCr of 0.67 mg/dL). Liver & Pancreas: Recent Labs  Lab 03/09/23 1402 03/14/23 0139 03/15/23 0954 03/16/23 0208  AST 34  --   --   --   ALT 27  --   --   --   ALKPHOS 50  --   --   --   BILITOT 1.3*  --   --   --   PROT 4.7*  --   --   --   ALBUMIN 2.4* 1.8* 1.8* 1.8*   No results for input(s): "LIPASE", "AMYLASE" in the last 168 hours.  No results for input(s): "AMMONIA" in the last 168 hours. Diabetic: No results for input(s): "HGBA1C" in the last 72 hours. Recent Labs  Lab 03/15/23 1148 03/15/23 1617 03/15/23 2102 03/16/23 0747 03/16/23 1237  GLUCAP 158* 164* 144* 107* 155*   Cardiac Enzymes: No results for input(s): "CKTOTAL", "CKMB", "CKMBINDEX", "TROPONINI" in the last 168 hours. No results for input(s): "PROBNP" in the last 8760 hours. Coagulation Profile: Recent Labs  Lab 03/09/23 1943  INR 1.7*   Thyroid Function Tests: No results for input(s): "TSH", "T4TOTAL", "FREET4", "T3FREE", "THYROIDAB" in the last 72 hours. Lipid Profile: No results for input(s): "CHOL", "HDL", "LDLCALC", "TRIG", "CHOLHDL", "LDLDIRECT" in the last 72 hours. Anemia Panel: No results for input(s): "VITAMINB12", "FOLATE", "FERRITIN", "TIBC", "IRON", "RETICCTPCT" in the last 72 hours. Urine analysis:     Component Value Date/Time   COLORURINE YELLOW 03/10/2023 0424   APPEARANCEUR CLEAR 03/10/2023 0424   LABSPEC 1.031 (H) 03/10/2023 0424   PHURINE 7.0 03/10/2023 0424   GLUCOSEU NEGATIVE 03/10/2023 0424   HGBUR SMALL (A) 03/10/2023 0424   BILIRUBINUR NEGATIVE 03/10/2023 0424   KETONESUR NEGATIVE 03/10/2023 0424   PROTEINUR NEGATIVE 03/10/2023 0424   UROBILINOGEN 0.2 03/23/2010 1156   NITRITE NEGATIVE 03/10/2023 0424   LEUKOCYTESUR NEGATIVE 03/10/2023 0424   Sepsis Labs: Invalid input(s): "PROCALCITONIN", "LACTICIDVEN"  Microbiology: Recent Results (from  the past 240 hour(s))  Resp panel by RT-PCR (RSV, Flu A&B, Covid) Anterior Nasal Swab     Status: None   Collection Time: 03/09/23 11:34 AM   Specimen: Anterior Nasal Swab  Result Value Ref Range Status   SARS Coronavirus 2 by RT PCR NEGATIVE NEGATIVE Final   Influenza A by PCR NEGATIVE NEGATIVE Final   Influenza B by PCR NEGATIVE NEGATIVE Final    Comment: (NOTE) The Xpert Xpress SARS-CoV-2/FLU/RSV plus assay is intended as an aid in the diagnosis of influenza from Nasopharyngeal swab specimens and should not be used as a sole basis for treatment. Nasal washings and aspirates are unacceptable for Xpert Xpress SARS-CoV-2/FLU/RSV testing.  Fact Sheet for Patients: EntrepreneurPulse.com.au  Fact Sheet for Healthcare Providers: IncredibleEmployment.be  This test is not yet approved or cleared by the [place] FDA and has been authorized for detection and/or diagnosis of SARS-CoV-2 by FDA under an Emergency Use Authorization (EUA). This EUA will remain in effect (meaning this test can be used) for the duration of the COVID-19 declaration under Section 564(b)(1) of the Act, 21 U.S.C. section 360bbb-3(b)(1), unless the authorization is terminated or revoked.     Resp Syncytial Virus by PCR NEGATIVE NEGATIVE Final    Comment: (NOTE) Fact Sheet for  Patients: EntrepreneurPulse.com.au  Fact Sheet for Healthcare Providers: IncredibleEmployment.be  This test is not yet approved or cleared by the [place] FDA and has been authorized for detection and/or diagnosis of SARS-CoV-2 by FDA under an Emergency Use Authorization (EUA). This EUA will remain in effect (meaning this test can be used) for the duration of the COVID-19 declaration under Section 564(b)(1) of the Act, 21 U.S.C. section 360bbb-3(b)(1), unless the authorization is terminated or revoked.  Performed at [facility], [address] [place], [place] [address]     Radiology Studies: No results found.    Darrio Bade T. [place]  If 7PM-7AM, please contact night-coverage [email or web address] 03/16/2023, 1:31 PM

## 2023-03-17 ENCOUNTER — Encounter (HOSPITAL_COMMUNITY): Payer: BC Managed Care – PPO

## 2023-03-17 ENCOUNTER — Inpatient Hospital Stay (HOSPITAL_COMMUNITY): Payer: Medicare PPO

## 2023-03-17 DIAGNOSIS — I10 Essential (primary) hypertension: Secondary | ICD-10-CM | POA: Diagnosis not present

## 2023-03-17 DIAGNOSIS — R7989 Other specified abnormal findings of blood chemistry: Secondary | ICD-10-CM | POA: Insufficient documentation

## 2023-03-17 DIAGNOSIS — R0602 Shortness of breath: Secondary | ICD-10-CM

## 2023-03-17 DIAGNOSIS — G9340 Encephalopathy, unspecified: Secondary | ICD-10-CM | POA: Diagnosis not present

## 2023-03-17 DIAGNOSIS — D72829 Elevated white blood cell count, unspecified: Secondary | ICD-10-CM | POA: Diagnosis not present

## 2023-03-17 DIAGNOSIS — R609 Edema, unspecified: Secondary | ICD-10-CM | POA: Diagnosis not present

## 2023-03-17 DIAGNOSIS — I4891 Unspecified atrial fibrillation: Secondary | ICD-10-CM | POA: Diagnosis not present

## 2023-03-17 DIAGNOSIS — L8990 Pressure ulcer of unspecified site, unspecified stage: Secondary | ICD-10-CM | POA: Diagnosis present

## 2023-03-17 LAB — GLUCOSE, CAPILLARY
Glucose-Capillary: 145 mg/dL — ABNORMAL HIGH (ref 70–99)
Glucose-Capillary: 167 mg/dL — ABNORMAL HIGH (ref 70–99)
Glucose-Capillary: 179 mg/dL — ABNORMAL HIGH (ref 70–99)
Glucose-Capillary: 142 mg/dL — ABNORMAL HIGH (ref 70–99)

## 2023-03-17 LAB — HEPARIN INDUCED PLATELET AB (HIT ANTIBODY): Heparin Induced Plt Ab: 0.052 OD (ref 0.000–0.400)

## 2023-03-17 LAB — RENAL FUNCTION PANEL
Albumin: 1.9 g/dL — ABNORMAL LOW (ref 3.5–5.0)
Anion gap: 7 (ref 5–15)
BUN: 18 mg/dL (ref 8–23)
CO2: 24 mmol/L (ref 22–32)
Calcium: 8.9 mg/dL (ref 8.9–10.3)
Chloride: 112 mmol/L — ABNORMAL HIGH (ref 98–111)
Creatinine, Ser: 0.67 mg/dL (ref 0.44–1.00)
GFR, Estimated: >60 mL/min (ref >60)
Glucose, Bld: 94 mg/dL (ref 70–99)
Phosphorus: 2.7 mg/dL (ref 2.5–4.6)
Potassium: 3.5 mmol/L (ref 3.5–5.1)
Sodium: 143 mmol/L (ref 135–145)

## 2023-03-17 LAB — CBC
HCT: 33.6 % — ABNORMAL LOW (ref 36.0–46.0)
Hemoglobin: 11.6 g/dL — ABNORMAL LOW (ref 12.0–15.0)
MCH: 29.7 pg (ref 26.0–34.0)
MCHC: 34.5 g/dL (ref 30.0–36.0)
MCV: 85.9 fL (ref 80.0–100.0)
Platelets: 83 10*3/uL — ABNORMAL LOW (ref 150–400)
RBC: 3.91 MIL/uL (ref 3.87–5.11)
RDW: 18.1 % — ABNORMAL HIGH (ref 11.5–15.5)
WBC: 4.5 10*3/uL (ref 4.0–10.5)
nRBC: 0 % (ref 0.0–0.2)

## 2023-03-17 LAB — MAGNESIUM: Magnesium: 1.7 mg/dL (ref 1.7–2.4)

## 2023-03-17 MED ORDER — METOPROLOL TARTRATE 5 MG/5ML IV SOLN
2.5000 mg | Freq: Once | INTRAVENOUS | Status: AC
  Administered 2023-03-17: 2.5 mg via INTRAVENOUS
  Filled 2023-03-17: qty 5

## 2023-03-17 MED ORDER — POTASSIUM CHLORIDE CRYS ER 20 MEQ PO TBCR
40.0000 meq | EXTENDED_RELEASE_TABLET | Freq: Once | ORAL | Status: AC
  Administered 2023-03-17: 40 meq via ORAL
  Filled 2023-03-17: qty 2

## 2023-03-17 MED ORDER — LACTATED RINGERS IV SOLN: INTRAVENOUS | Status: AC

## 2023-03-17 MED ORDER — MAGNESIUM SULFATE 2 GM/50ML IV SOLN
2.0000 g | Freq: Once | INTRAVENOUS | Status: AC
  Administered 2023-03-17: 2 g via INTRAVENOUS
  Filled 2023-03-17: qty 50

## 2023-03-17 MED ORDER — MAGNESIUM SULFATE 2 GM/50ML IV SOLN
INTRAVENOUS | Status: AC
  Filled 2023-03-17: qty 50

## 2023-03-17 MED ORDER — DIGOXIN 125 MCG PO TABS
0.1250 mg | ORAL_TABLET | Freq: Every day | ORAL | Status: DC
  Administered 2023-03-17 – 2023-03-19 (×3): 0.125 mg via ORAL
  Filled 2023-03-17 (×3): qty 1

## 2023-03-17 NOTE — Progress Notes (Signed)
HR sustained in 120's-140's, frequently noted as high as 150's-160's. Mansy, MD notified via secure chat. Order for IV metoprolol 2.5 mg x1 received & administered.

## 2023-03-17 NOTE — Progress Notes (Signed)
VASCULAR LAB    Bilateral lower extremity venous duplex has been performed.  See CV proc for preliminary results.   Marie Chow, RVT 03/17/2023, 11:02 AM

## 2023-03-17 NOTE — Progress Notes (Signed)
PROGRESS NOTE  Amanda Green [id number] DOB: [birth year]   PCP: Nicholas Lose, MD  Patient is from: Home  DOA: 03/09/2023 LOS: 8  Chief complaints Chief Complaint  Patient presents with   Leg Swelling     Brief Narrative / Interim history: 88 year old F with PMH of BRCA with brain mets s/p bilateral mastectomy, DM-2, dementia, HTN, CVA and cardiac amyloidosis presenting with shortness of breath and edema and admitted for A-fib with RVR, acute diastolic CHF and AKI.  Patient was started on IV Lasix for CHF.  Cardiology consulted and started on digoxin for rate control due to marginal blood pressures.  However, digoxin held due to AKI.  Diuretics held due to soft blood pressures.  Patient continued to decline despite all interventions.  Continued to be fatigued and lethargic.  Palliative medicine consulted.  Patient and family wanted to continue full code with full scope of care.  Lethargy, A-fib and hypotension improving.  Still with mild RVR.  Adjusting meds.  Oncology consulted.   Hospital course complicated by acute urinary retention and thrombocytopenia.  Foley catheter placed.  Oncology consulted for thrombocytopenia.  Therapy recommended SNF.  TOC following.   Subjective: Seen and examined earlier this morning.  No major events overnight of this morning.  Remains in RVR with as high as 150s overnight.  Family concerned about dehydration and poor p.o. intake.  Patient has no complaints other than fatigue.  She is not a great historian.  Daughters at bedside.  Objective: Vitals:   03/17/23 0649 03/17/23 0744 03/17/23 0903 03/17/23 1051  BP: 105/76 104/78 114/68 103/64  Pulse: (!) 121  (!) 138   Resp:  19 (!) 24 20  Temp:  98.3 F (36.8 C)  97.7 F (36.5 C)  TempSrc:  Axillary  Axillary  SpO2:   98% 97%  Weight:      Height:        Examination: GENERAL: No apparent distress.  Nontoxic. HEENT: MMM.  Vision and hearing grossly intact.  NECK: Supple.  No apparent  JVD.  RESP:  No IWOB.  Fair aeration bilaterally. CVS:  RRR. Heart sounds normal.  ABD/GI/GU: BS+. Abd soft, NTND.  MSK/EXT:   No apparent deformity. Moves extremities.  BLE weakness.  No edema.  SKIN: no apparent skin lesion or wound NEURO: Sleepy but wakes to voice.  Oriented to self, place and person.  Follows commands.  No apparent focal neuro deficit. PSYCH: Calm. Normal affect.    Procedures:  None  Microbiology summarized: [id number], influenza and RSV PCR nonreactive.  Assessment and plan: Principal Problem:   Atrial fibrillation with RVR (HCC) Active Problems:   Acute on chronic diastolic CHF (congestive heart failure) (HCC)   Hyponatremia   Elevated troponin   Ductal carcinoma in situ (DCIS) of left breast   Cardiac amyloidosis (HCC)   Controlled type 2 diabetes mellitus without complication, without long-term current use of insulin (HCC)   Essential hypertension   Cognitive impairment   Leukocytosis   Encephalopathy acute   Elevated d-dimer   Pressure injury of skin  A-fib with RVR: Initially started on Cardizem drip that was discontinued due to hypotension.  Eliquis discontinued due to concern for brain bleed in the setting of worsening mentation and brain mets.  Digoxin held due to AKI but AKI resolved now.  Hypotension resolved as well.  Cardiology recommended continuing metoprolol and signed off. -Continue metoprolol 25 mg 3 times daily -Add digoxin 0.125 mg daily -Gentle IV fluid -Optimize electrolytes -Palliative medicine  following.   Acute on chronic diastolic CHF: Grossly euvolemic except for edema. Cardiac amyloidosis  -Continue tafamidis -Closely monitor respiratory status while on IV fluid -Continue holding diuretics. -Strict I's and O's, daily weight, fluid restriction -Ted hose    AKI: Likely prerenal from hypotension and diuretics.  Renal US negative.  Resolved. Recent Labs    03/06/23 2243 03/09/23 1402 03/10/23 0233 03/11/23 0156  03/12/23 0231 03/13/23 0232 03/14/23 0139 03/15/23 0954 03/16/23 0208 03/17/23 0154  BUN 30* 26* 32* 45* 59* 43* 27* 16 16 18   CREATININE 0.91 0.89 0.90 1.36* 1.89* 1.00 0.75 0.63 0.67 0.67  -Continue holding nephrotoxic  Acute urinary retention/hematuria: Seems like she has significant urinary tension with bladder over distention the night of 3/26.  Hematuria likely traumatic from I and O cath.  Renal US negative. -Continue Foley catheter for 7 to 10 days   Acute metabolic encephalopathy/underlying dementia: Awake, alert and oriented to self, place and family.  CT head without acute finding.  Recent MRI with brain mass concerning for metastasis -Reorientation and delirium precaution -Continue home Namenda.  Breast cancer with brain mets s/p brain radiation.  Last radiation on 3/20.  -Decrease Decadron to 2 mg daily as previously planned -Per oncology and radiation oncology   Controlled DM-2 with hyperglycemia: A1c 6.5% in 01/2023. Recent Labs  Lab 03/16/23 1237 03/16/23 1606 03/16/23 2118 03/17/23 0742 03/17/23 1118  GLUCAP 155* 133* 92 145* 167*  -Continue current insulin regimen  Essential hypertension: Normotensive this morning. -Low-dose metoprolol as above.  Demand ischemia: In setting of A Fib RVR and CHF    Goals of care: Patient with multiple complicated comorbidities as above.  Poor long-term prognosis.  Still full code. -Palliative medicine following  Thrombocytopenia: Has not received heparin or heparin products.  Not on chemo or other medication that could contribute to thrombocytopenia as far as I can tell.  DIC labs with elevated D-dimer.  Started to improve. Recent Labs  Lab 03/11/23 0156 03/13/23 0232 03/14/23 0139 03/15/23 1105 03/16/23 0208 03/16/23 1750 03/17/23 0154  PLT 172 116* 107* 88* 53* 90* 83*  -Appreciate input by hematology  Elevated D-dimer: Recent CTA chest negative for PE.  Patient has been off Eliquis -Check lower extremity  venous Doppler to rule out DVT  Mild hyperkalemia: Resolved  Physical deconditioning -PT/OT-final disposition SNF.  Body mass index is 28.04 kg/m.  Pressure skin injury: POA Pressure Injury 03/09/23 Buttocks Right Stage 2 -  Partial thickness loss of dermis presenting as a shallow open injury with a red, pink wound bed without slough. less than 1cm size. pink/red (Active)  03/09/23 1854  Location: Buttocks  Location Orientation: Right  Staging: Stage 2 -  Partial thickness loss of dermis presenting as a shallow open injury with a red, pink wound bed without slough.  Wound Description (Comments): less than 1cm size. pink/red  Present on Admission: Yes  Dressing Type Foam - Lift dressing to assess site every shift 03/17/23 0903   DVT prophylaxis:  Place and maintain sequential compression device Start: 03/13/23 1058 Place TED hose Start: 03/12/23 1159 Place TED hose Start: 03/09/23 1723  Code Status: Full code Family Communication: Updated patient's daughters at bedside. Level of care: Telemetry Medical Status is: Inpatient Remains inpatient appropriate because: A-fib with RVR, thrombocytopenia   Final disposition: SNF Consultants:  Cardiology Palliative medicine Hematology/oncology  55 minutes with more than 50% spent in reviewing records, counseling patient/family and coordinating care.   Sch Meds:  Scheduled Meds:  brimonidine  1 drop Left Eye BID   And   timolol  1 drop Left Eye BID   Chlorhexidine Gluconate Cloth  6 each Topical Daily   dexamethasone (DECADRON) injection  2 mg Intravenous Q24H   digoxin  0.125 mg Oral Daily   dorzolamide  1 drop Left Eye BID   Gerhardt's butt cream   Topical BID   insulin aspart  0-9 Units Subcutaneous TID WC   metoprolol tartrate  25 mg Oral TID   ofloxacin  1 drop Right Eye QID   prednisoLONE acetate  1 drop Right Eye QID   Tafamidis  61 mg Oral Daily   Continuous Infusions:  magnesium sulfate     sodium chloride 5  mL/hr at 03/14/23 1753   lactated ringers 75 mL/hr at 03/17/23 1050   PRN Meds:.magnesium sulfate, sodium chloride, acetaminophen **OR** acetaminophen, mouth rinse  Antimicrobials: Anti-infectives (From admission, onward)    None        I have personally reviewed the following labs and images: CBC: Recent Labs  Lab 03/13/23 0232 03/14/23 0139 03/15/23 1105 03/16/23 0208 03/16/23 1750 03/17/23 0154  WBC 7.5 6.7 6.3 5.0  --  4.5  HGB 12.2 11.5* 11.2* 11.8*  --  11.6*  HCT 34.1* 32.3* 32.4* 34.2*  --  33.6*  MCV 83.2 83.7 85.5 84.9  --  85.9  PLT 116* 107* 88* 53* 90* 83*   BMP &GFR Recent Labs  Lab 03/13/23 0232 03/14/23 0139 03/15/23 0954 03/16/23 0208 03/17/23 0154  NA 137 137 139 140 143  K 3.5 3.9 5.4* 3.8 3.5  CL 105 110 108 110 112*  CO2 24 24 23 24 24   GLUCOSE 73 122* 162* 130* 94  BUN 43* 27* 16 16 18   CREATININE 1.00 0.75 0.63 0.67 0.67  CALCIUM 8.6* 8.5* 8.7* 8.7* 8.9  MG 2.1 2.0 1.8 1.8 1.7  PHOS  --  2.1* 2.7 2.2* 2.7   Estimated Creatinine Clearance: 48 mL/min (by C-G formula based on SCr of 0.67 mg/dL). Liver & Pancreas: Recent Labs  Lab 03/14/23 0139 03/15/23 0954 03/16/23 0208 03/17/23 0154  ALBUMIN 1.8* 1.8* 1.8* 1.9*   No results for input(s): "LIPASE", "AMYLASE" in the last 168 hours.  No results for input(s): "AMMONIA" in the last 168 hours. Diabetic: No results for input(s): "HGBA1C" in the last 72 hours. Recent Labs  Lab 03/16/23 1237 03/16/23 1606 03/16/23 2118 03/17/23 0742 03/17/23 1118  GLUCAP 155* 133* 92 145* 167*   Cardiac Enzymes: No results for input(s): "CKTOTAL", "CKMB", "CKMBINDEX", "TROPONINI" in the last 168 hours. No results for input(s): "PROBNP" in the last 8760 hours. Coagulation Profile: Recent Labs  Lab 03/16/23 1750  INR 1.1   Thyroid Function Tests: No results for input(s): "TSH", "T4TOTAL", "FREET4", "T3FREE", "THYROIDAB" in the last 72 hours. Lipid Profile: No results for input(s): "CHOL",  "HDL", "LDLCALC", "TRIG", "CHOLHDL", "LDLDIRECT" in the last 72 hours. Anemia Panel: Recent Labs    03/16/23 1614  VITAMINB12 1,668*  FOLATE 21.1   Urine analysis:    Component Value Date/Time   COLORURINE YELLOW 03/10/2023 0424   APPEARANCEUR CLEAR 03/10/2023 0424   LABSPEC 1.031 (H) 03/10/2023 0424   PHURINE 7.0 03/10/2023 0424   GLUCOSEU NEGATIVE 03/10/2023 0424   HGBUR SMALL (A) 03/10/2023 0424   BILIRUBINUR NEGATIVE 03/10/2023 0424   KETONESUR NEGATIVE 03/10/2023 0424   PROTEINUR NEGATIVE 03/10/2023 0424   UROBILINOGEN 0.2 03/23/2010 1156   NITRITE NEGATIVE 03/10/2023 0424   LEUKOCYTESUR NEGATIVE 03/10/2023 0424   Sepsis  Labs: Invalid input(s): "PROCALCITONIN", "LACTICIDVEN"  Microbiology: Recent Results (from the past 240 hour(s))  Resp panel by RT-PCR (RSV, Flu A&B, Covid) Anterior Nasal Swab     Status: None   Collection Time: 03/09/23 11:34 AM   Specimen: Anterior Nasal Swab  Result Value Ref Range Status   SARS Coronavirus 2 by RT PCR NEGATIVE NEGATIVE Final   Influenza A by PCR NEGATIVE NEGATIVE Final   Influenza B by PCR NEGATIVE NEGATIVE Final    Comment: (NOTE) The Xpert Xpress SARS-CoV-2/FLU/RSV plus assay is intended as an aid in the diagnosis of influenza from Nasopharyngeal swab specimens and should not be used as a sole basis for treatment. Nasal washings and aspirates are unacceptable for Xpert Xpress SARS-CoV-2/FLU/RSV testing.  Fact Sheet for Patients: EntrepreneurPulse.com.au  Fact Sheet for Healthcare Providers: IncredibleEmployment.be  This test is not yet approved or cleared by the [place] FDA and has been authorized for detection and/or diagnosis of SARS-CoV-2 by FDA under an Emergency Use Authorization (EUA). This EUA will remain in effect (meaning this test can be used) for the duration of the COVID-19 declaration under Section 564(b)(1) of the Act, 21 U.S.C. section 360bbb-3(b)(1), unless the  authorization is terminated or revoked.     Resp Syncytial Virus by PCR NEGATIVE NEGATIVE Final    Comment: (NOTE) Fact Sheet for Patients: EntrepreneurPulse.com.au  Fact Sheet for Healthcare Providers: IncredibleEmployment.be  This test is not yet approved or cleared by the [place] FDA and has been authorized for detection and/or diagnosis of SARS-CoV-2 by FDA under an Emergency Use Authorization (EUA). This EUA will remain in effect (meaning this test can be used) for the duration of the COVID-19 declaration under Section 564(b)(1) of the Act, 21 U.S.C. section 360bbb-3(b)(1), unless the authorization is terminated or revoked.  Performed at [facility], [place] [address] [place], [place] [address]     Radiology Studies: VAS [place] LOWER EXTREMITY VENOUS (DVT)  Result Date: 03/17/2023  Lower Venous DVT Study Patient Name:  MICHAELENE CERAR  Date of Exam:   03/17/2023 Medical Rec #: [id number]           Accession #:    [id number] Date of Birth: December 15, 1935           Patient Gender: F Patient Age:   63 years Exam Location:  [facility] Procedure:      VAS [place] LOWER EXTREMITY VENOUS (DVT) Referring Phys: Bretta Bang Jaedynn Bohlken --------------------------------------------------------------------------------  Indications: Edema, SOB, and cardiac amyloidosis, A-fib with RVR.  Risk Factors: Cancer BRCA with brain mets. Limitations: Positioning and body habitus. Comparison Study: No prior study on file Performing Technologist: Sharion Dove RVS  Examination Guidelines: A complete evaluation includes B-mode imaging, spectral Doppler, color Doppler, and power Doppler as needed of all accessible portions of each vessel. Bilateral testing is considered an integral part of a complete examination. Limited examinations for reoccurring indications may be performed as noted. The reflux portion of the exam is performed with the patient in reverse Trendelenburg.   +---------+---------------+---------+-----------+----------+-------------------+ RIGHT    CompressibilityPhasicitySpontaneityPropertiesThrombus Aging      +---------+---------------+---------+-----------+----------+-------------------+ CFV      Full           Yes      Yes                                      +---------+---------------+---------+-----------+----------+-------------------+ SFJ      Full                                                             +---------+---------------+---------+-----------+----------+-------------------+  FV Prox  Full           Yes      Yes                                      +---------+---------------+---------+-----------+----------+-------------------+ FV Mid                  Yes      Yes                                      +---------+---------------+---------+-----------+----------+-------------------+ FV Distal                                             Not well visualized +---------+---------------+---------+-----------+----------+-------------------+ PFV      Full                                                             +---------+---------------+---------+-----------+----------+-------------------+ POP      Full           Yes      Yes                                      +---------+---------------+---------+-----------+----------+-------------------+ PTV                                                   Not well visualized +---------+---------------+---------+-----------+----------+-------------------+ PERO                                                  Not well visualized +---------+---------------+---------+-----------+----------+-------------------+   +---------+---------------+---------+-----------+----------+-------------------+ LEFT     CompressibilityPhasicitySpontaneityPropertiesThrombus Aging      +---------+---------------+---------+-----------+----------+-------------------+  CFV      Full           Yes      Yes                                      +---------+---------------+---------+-----------+----------+-------------------+ SFJ      Full                                                             +---------+---------------+---------+-----------+----------+-------------------+ FV Prox  Full                                                             +---------+---------------+---------+-----------+----------+-------------------+  FV Mid   Full           Yes      Yes                                      +---------+---------------+---------+-----------+----------+-------------------+ FV DistalFull                                                             +---------+---------------+---------+-----------+----------+-------------------+ PFV      Full                                                             +---------+---------------+---------+-----------+----------+-------------------+ POP                     Yes      Yes                                      +---------+---------------+---------+-----------+----------+-------------------+ PTV                                                   Not well visualized +---------+---------------+---------+-----------+----------+-------------------+ PERO                                                  Not well visualized +---------+---------------+---------+-----------+----------+-------------------+     Summary: BILATERAL: -No evidence of popliteal cyst, bilaterally. RIGHT: - There is no evidence of deep vein thrombosis in the lower extremity. However, portions of this examination were limited- see technologist comments above.  LEFT: - There is no evidence of deep vein thrombosis in the lower extremity. However, portions of this examination were limited- see technologist comments above.  *See table(s) above for measurements and observations.    Preliminary       Trequan Marsolek T.  [place]  If 7PM-7AM, please contact night-coverage [email or web address] 03/17/2023, 12:32 PM

## 2023-03-18 DIAGNOSIS — Z515 Encounter for palliative care: Secondary | ICD-10-CM | POA: Diagnosis not present

## 2023-03-18 DIAGNOSIS — I5033 Acute on chronic diastolic (congestive) heart failure: Secondary | ICD-10-CM | POA: Diagnosis not present

## 2023-03-18 DIAGNOSIS — G9340 Encephalopathy, unspecified: Secondary | ICD-10-CM | POA: Diagnosis not present

## 2023-03-18 DIAGNOSIS — I10 Essential (primary) hypertension: Secondary | ICD-10-CM | POA: Diagnosis not present

## 2023-03-18 DIAGNOSIS — Z7189 Other specified counseling: Secondary | ICD-10-CM | POA: Diagnosis not present

## 2023-03-18 DIAGNOSIS — I4891 Unspecified atrial fibrillation: Secondary | ICD-10-CM | POA: Diagnosis not present

## 2023-03-18 DIAGNOSIS — D72829 Elevated white blood cell count, unspecified: Secondary | ICD-10-CM | POA: Diagnosis not present

## 2023-03-18 LAB — CBC
HCT: 32.2 % — ABNORMAL LOW (ref 36.0–46.0)
Hemoglobin: 10.8 g/dL — ABNORMAL LOW (ref 12.0–15.0)
MCH: 29.5 pg (ref 26.0–34.0)
MCHC: 33.5 g/dL (ref 30.0–36.0)
MCV: 88 fL (ref 80.0–100.0)
Platelets: 73 10*3/uL — ABNORMAL LOW (ref 150–400)
RBC: 3.66 MIL/uL — ABNORMAL LOW (ref 3.87–5.11)
RDW: 18.3 % — ABNORMAL HIGH (ref 11.5–15.5)
WBC: 4.3 10*3/uL (ref 4.0–10.5)
nRBC: 0 % (ref 0.0–0.2)

## 2023-03-18 LAB — GLUCOSE, CAPILLARY
Glucose-Capillary: 104 mg/dL — ABNORMAL HIGH (ref 70–99)
Glucose-Capillary: 172 mg/dL — ABNORMAL HIGH (ref 70–99)
Glucose-Capillary: 154 mg/dL — ABNORMAL HIGH (ref 70–99)
Glucose-Capillary: 192 mg/dL — ABNORMAL HIGH (ref 70–99)

## 2023-03-18 LAB — MAGNESIUM: Magnesium: 1.9 mg/dL (ref 1.7–2.4)

## 2023-03-18 LAB — RENAL FUNCTION PANEL
Albumin: 1.8 g/dL — ABNORMAL LOW (ref 3.5–5.0)
Anion gap: 5 (ref 5–15)
BUN: 16 mg/dL (ref 8–23)
CO2: 25 mmol/L (ref 22–32)
Calcium: 8.6 mg/dL — ABNORMAL LOW (ref 8.9–10.3)
Chloride: 114 mmol/L — ABNORMAL HIGH (ref 98–111)
Creatinine, Ser: 0.63 mg/dL (ref 0.44–1.00)
GFR, Estimated: >60 mL/min (ref >60)
Glucose, Bld: 109 mg/dL — ABNORMAL HIGH (ref 70–99)
Phosphorus: 2.5 mg/dL (ref 2.5–4.6)
Potassium: 4.2 mmol/L (ref 3.5–5.1)
Sodium: 144 mmol/L (ref 135–145)

## 2023-03-18 MED ORDER — OFF THE BEAT BOOK
Freq: Once | Status: AC
  Filled 2023-03-18: qty 1

## 2023-03-18 MED ORDER — CHLORHEXIDINE GLUCONATE CLOTH 2 % EX PADS
6.0000 | MEDICATED_PAD | Freq: Every day | CUTANEOUS | Status: DC
  Administered 2023-03-18 – 2023-03-21 (×4): 6 via TOPICAL

## 2023-03-18 MED ORDER — METOPROLOL TARTRATE 25 MG PO TABS
37.5000 mg | ORAL_TABLET | Freq: Three times a day (TID) | ORAL | Status: DC
  Administered 2023-03-18 – 2023-03-19 (×3): 37.5 mg via ORAL
  Filled 2023-03-18 (×3): qty 1

## 2023-03-18 MED ORDER — LACTATED RINGERS IV SOLN: INTRAVENOUS | Status: AC

## 2023-03-18 NOTE — TOC Progression Note (Signed)
Transition of Care [facility]) - Progression Note    Patient Details  Name: Amanda Green MRN: [id number] Date of Birth: 1935/01/09  Transition of Care [facility]) CM/SW [place],  Phone Number: 03/18/2023, 10:51 AM  Clinical Narrative:     SNF auth still pending at this time. TOC will continue to follow.      Barriers to Discharge: Continued Medical Work up  Expected Discharge Plan and Services In-house Referral: Clinical Social Work     Living arrangements for the past 2 months: Single Family Home                                       Social Determinants of Health (SDOH) Interventions SDOH Screenings   Food Insecurity: No Food Insecurity (03/10/2023)  Housing: Low Risk  (03/10/2023)  Transportation Needs: No Transportation Needs (03/10/2023)  Utilities: Not At Risk (03/10/2023)  Tobacco Use: Low Risk  (03/09/2023)    Readmission Risk Interventions     No data to display

## 2023-03-18 NOTE — Progress Notes (Signed)
Daily Progress Note   Patient Name: Amanda Green       Date: 03/18/2023 DOB: 1935-10-06  Age: 88 y.o. MRN#: [id number] Attending Physician: Mercy Riding, MD Primary Care Physician: Nicholas Lose, MD Admit Date: 03/09/2023 Length of Stay: 9 days  Reason for Consultation/Follow-up: Establishing goals of care  HPI/Patient Profile:  88 y.o. female  with past medical history of breast cancer s/p right mastectomy in 199 and left lumpectomy in 2022 followed by left mastectomy in 2022 for DCIS and hx of mets to brain, T2DM, HTN, HLD, hx of CVA, memory loss, cardiac amyloid on tafamidis who presented to ED with complaints of shortness of breath and leg swelling. She was admitted on 03/09/2023 with A-fib RVR, acute on chronic diastolic CHF, AKI, acute metabolic encephalopathy, and others.    PMT was consulted for [place] conversations.  Subjective:   Subjective: Chart Reviewed. Updates received. Patient Assessed. Created space and opportunity for patient  and family to explore thoughts and feelings regarding current medical situation.  Today's Discussion: Today I saw the patient at bedside.  Her daughter Amanda Green was also present.  When I entered the room she was awake, alert, and smiled at Amanda Green.  When asked her how she is doing she states she is doing okay.  Noted some fatigue which she agreed.  Her daughter thinks that her elevated heart rate last night kind of wiped her out.  She denies pain, nausea, vomiting, dyspnea.  Her heart rate is 120's on the monitor, irregular with known A-fib.  She states her appetite is "so-so".  Her daughter notes that she has had a decreased appetite ever since radiation, last treatment 2 weeks ago.  We discussed that this is often a side effect.  Her daughter feels that she eats more when she has large things such as sandwiches that she can hold and feed herself.  We discussed the need to float eat to live" in order to gain strength and support her goals for continued  improvement.  I encouraged her to try to eat more than she may become before, as long as it does not make her sick on her stomach.  I explained that goals are clear, and have been for some time.  She also has noted clinical improvement.  Daughter states that they are taking it a day to time.  Anticipate discharge to SNF for rehab and strengthening.  At this point I informed them that palliative medicine would back off but we are available to come back if needed.  I provided emotional and general support through therapeutic listening, empathy, sharing of stories, and other techniques. I answered all questions and addressed all concerns to the best of my ability.   Review of Systems  Constitutional:  Positive for fatigue.  Respiratory:  Negative for shortness of breath.   Cardiovascular:  Negative for chest pain.  Gastrointestinal:  Negative for abdominal pain, nausea and vomiting.    Objective:   Vital Signs:  BP 125/88 (BP Location: Right Arm)   Pulse (!) 135   Temp 98.4 F (36.9 C) (Oral)   Resp 20   Ht 5\' 3"  (1.6 m)   Wt 70.1 kg   SpO2 96%   BMI 27.38 kg/m   Physical Exam: Physical Exam Vitals and nursing note reviewed.  Constitutional:      General: She is not in acute distress.    Appearance: She is ill-appearing.  HENT:     Head: Normocephalic and atraumatic.  Cardiovascular:  Rate and Rhythm: Tachycardia present. Rhythm irregular.  Pulmonary:     Effort: Pulmonary effort is normal. No respiratory distress.     Breath sounds: No wheezing or rhonchi.  Abdominal:     General: Abdomen is flat. Bowel sounds are normal. There is no distension.     Palpations: Abdomen is soft.  Skin:    General: Skin is warm and dry.  Neurological:     General: No focal deficit present.     Mental Status: She is alert.  Psychiatric:        Mood and Affect: Mood normal.        Behavior: Behavior normal.     Palliative Assessment/Data: 40%    Existing Vynca/ACP  Documentation: None  Assessment & Plan:   Impression: Present on Admission:  Atrial fibrillation with RVR  Cognitive impairment  Essential hypertension  Cardiac amyloidosis  Ductal carcinoma in situ (DCIS) of left breast  SUMMARY OF RECOMMENDATIONS   Full code Full scope of care If significant decline may need further GOC about how to proceed Anticipate OP f/u with oncology about cancer treatment Anticipate d/c to SNF soon Palliative medicine will back off at this time Please notify us or reconsult for any significant clinical change or new palliative needs  Symptom Management:  Per primary team PMT is available to assist as needed  Code Status: Full code  Prognosis: Unable to determine  Discharge Planning:  [place]  Discussed with: Patient, patient's family, medical team, nursing team  Thank you for allowing [place] to participate in the care of Amanda Green PMT will continue to support holistically.  Billing based on MDM: High  Problems Addressed: One acute or chronic illness or injury that poses a threat to life or bodily function  Amount and/or Complexity of Data: Category 3:Discussion of management or test interpretation with external physician/other qualified health care professional/appropriate source (not separately reported)  Risks: N/A    [name], NP Palliative Medicine Team  Team Phone # [phone] (Nights/Weekends)  08/15/2021, 8:17 AM

## 2023-03-18 NOTE — Progress Notes (Signed)
PROGRESS NOTE  Amanda Green [id number] DOB: February 15, 1935   PCP: Nicholas Lose, MD  Patient is from: Home  DOA: 03/09/2023 LOS: 35  Chief complaints Chief Complaint  Patient presents with   Leg Swelling     Brief Narrative / Interim history: 88 year old F with PMH of BRCA with brain mets s/p bilateral mastectomy, DM-2, dementia, HTN, CVA and cardiac amyloidosis presenting with shortness of breath and edema and admitted for A-fib with RVR, acute diastolic CHF and AKI.  Patient was started on IV Lasix for CHF.  Cardiology consulted and started on digoxin for rate control due to marginal blood pressures.  However, digoxin held due to AKI.  Diuretics held due to soft blood pressures.  Patient continued to decline despite all interventions.  Continued to be fatigued and lethargic.  Palliative medicine consulted.  Patient and family wanted to continue full code with full scope of care.  Lethargy, A-fib and hypotension improving.  Still with mild RVR.  Adjusting meds.  Oncology consulted.   Hospital course complicated by acute urinary retention and thrombocytopenia.  Foley catheter placed.  Oncology consulted for thrombocytopenia which is now improving.    Difficult to control A-fib.  Therapy recommended SNF.  TOC following.   Subjective: Seen and examined earlier this morning.  No major events overnight of this morning.  No complaints.  Patient denies pain, shortness of breath, palpitation or dizziness.  Denies GI or UTI symptoms.  Reports loose bowel movement.  Daughter denies melena or hematochezia.  Objective: Vitals:   03/18/23 0445 03/18/23 0551 03/18/23 0833 03/18/23 1005  BP: 129/82 123/78 125/88   Pulse: (!) 115 (!) 117 66 (!) 135  Resp: (!) 21  (!) 24 20  Temp: 97.6 F (36.4 C)  98.4 F (36.9 C)   TempSrc: Oral  Oral   SpO2: 96%  96%   Weight: 70.1 kg     Height:        Examination:  GENERAL: No apparent distress.  Nontoxic. HEENT: MMM.  Vision and hearing  grossly intact.  NECK: Supple.  No apparent JVD.  RESP:  No IWOB.  Fair aeration bilaterally. CVS: Irregular rhythm.  HR ranges from 110-130s.  ABD/GI/GU: BS+. Abd soft, NTND.  MSK/EXT:   No apparent deformity. Moves extremities.  BLE weakness.  No edema. SKIN: no apparent skin lesion or wound NEURO: Awake and alert. Oriented to self, place and person.  No apparent focal neuro deficit. PSYCH: Calm. Normal affect.    Procedures:  None  Microbiology summarized: [id number], influenza and RSV PCR nonreactive.  Assessment and plan: Principal Problem:   Atrial fibrillation with RVR Active Problems:   Acute on chronic diastolic CHF (congestive heart failure)   Hyponatremia   Elevated troponin   Ductal carcinoma in situ (DCIS) of left breast   Cardiac amyloidosis   Controlled type 2 diabetes mellitus without complication, without long-term current use of insulin   Essential hypertension   Cognitive impairment   Leukocytosis   Encephalopathy acute   Elevated d-dimer   Pressure injury of skin  A-fib with RVR: Initially started on Cardizem drip that was discontinued due to hypotension.  Eliquis discontinued due to concern for brain bleed in the setting of worsening mentation and brain mets.  Digoxin held due to AKI but AKI resolved now.  Hypotension resolved as well.  Cardiology recommended continuing metoprolol and signed off. -Increase metoprolol from 25 mg to 37.5 mg 3 times daily -Continue digoxin 0.125 mg daily -Continue gentle IV fluid -Optimize  electrolytes -Palliative medicine following.   Acute on chronic diastolic CHF: Grossly euvolemic except for edema. Cardiac amyloidosis  -Continue tafamidis -Closely monitor respiratory status while on IV fluid -Continue holding diuretics. -Strict I's and O's, daily weight, fluid restriction -Ted hose    AKI: Likely prerenal from hypotension and diuretics.  Renal US negative.  Resolved. Recent Labs    03/09/23 1402 03/10/23 0233  03/11/23 0156 03/12/23 0231 03/13/23 0232 03/14/23 0139 03/15/23 0954 03/16/23 0208 03/17/23 0154 03/18/23 0749  BUN 26* 32* 45* 59* 43* 27* 16 16 18 16   CREATININE 0.89 0.90 1.36* 1.89* 1.00 0.75 0.63 0.67 0.67 0.63  -Continue holding nephrotoxic  Acute urinary retention/hematuria: Seems like she has significant urinary tension with bladder over distention the night of 3/26.  Hematuria likely traumatic from I and O cath.  Renal US negative. -Continue Foley catheter for 7 to 10 days   Acute metabolic encephalopathy/underlying dementia: Awake, alert and oriented to self, place and family.  CT head without acute finding.  Recent MRI with brain mass concerning for metastasis -Reorientation and delirium precaution -Continue home Namenda.  Breast cancer with brain mets s/p brain radiation.  Last radiation on 3/20.  -Continue Decadron to 2 mg daily  -Per oncology and radiation oncology   Controlled DM-2 with hyperglycemia: A1c 6.5% in 01/2023. Recent Labs  Lab 03/17/23 0742 03/17/23 1118 03/17/23 1604 03/17/23 2130 03/18/23 0832  GLUCAP 145* 167* 179* 142* 104*  -Continue current insulin regimen  Essential hypertension: Normotensive this morning. -Low-dose metoprolol as above.  Demand ischemia: In setting of A Fib RVR and CHF    Goals of care: Patient with multiple complicated comorbidities as above.  Poor long-term prognosis.  Still full code. -Palliative medicine following  Thrombocytopenia: Has not received heparin or heparin products.  Not on chemo or other medication that could contribute to thrombocytopenia as far as I can tell.  DIC labs with elevated D-dimer.  Started to improve. Recent Labs  Lab 03/13/23 0232 03/14/23 0139 03/15/23 1105 03/16/23 0208 03/16/23 1750 03/17/23 0154 03/18/23 0749  PLT 116* 107* 88* 53* 90* 83* 73*  -Appreciate input by hematology  Elevated D-dimer: Recent CTA chest negative for PE.  LE venous Doppler negative for DVT.  Mild  hyperkalemia: Resolved  Physical deconditioning -PT/OT-final disposition SNF.  Body mass index is 27.38 kg/m.  Pressure skin injury: POA Pressure Injury 03/09/23 Buttocks Right Stage 2 -  Partial thickness loss of dermis presenting as a shallow open injury with a red, pink wound bed without slough. less than 1cm size. pink/red (Active)  03/09/23 1854  Location: Buttocks  Location Orientation: Right  Staging: Stage 2 -  Partial thickness loss of dermis presenting as a shallow open injury with a red, pink wound bed without slough.  Wound Description (Comments): less than 1cm size. pink/red  Present on Admission: Yes  Dressing Type Foam - Lift dressing to assess site every shift 03/18/23 0845   DVT prophylaxis:  Place and maintain sequential compression device Start: 03/13/23 1058 Place TED hose Start: 03/12/23 1159 Place TED hose Start: 03/09/23 1723  Code Status: Full code Family Communication: Updated patient's daughters at bedside. Level of care: Telemetry Medical Status is: Inpatient Remains inpatient appropriate because: A-fib with RVR, thrombocytopenia   Final disposition: SNF Consultants:  Cardiology Palliative medicine Hematology/oncology  35 minutes with more than 50% spent in reviewing records, counseling patient/family and coordinating care.   Sch Meds:  Scheduled Meds:  brimonidine  1 drop Left Eye BID  And   timolol  1 drop Left Eye BID   Chlorhexidine Gluconate Cloth  6 each Topical Daily   dexamethasone (DECADRON) injection  2 mg Intravenous Q24H   digoxin  0.125 mg Oral Daily   dorzolamide  1 drop Left Eye BID   Gerhardt's butt cream   Topical BID   insulin aspart  0-9 Units Subcutaneous TID WC   metoprolol tartrate  37.5 mg Oral TID   ofloxacin  1 drop Right Eye QID   prednisoLONE acetate  1 drop Right Eye QID   Tafamidis  61 mg Oral Daily   Continuous Infusions:  sodium chloride 5 mL/hr at 03/14/23 1753   PRN Meds:.sodium chloride,  acetaminophen **OR** acetaminophen, mouth rinse  Antimicrobials: Anti-infectives (From admission, onward)    None        I have personally reviewed the following labs and images: CBC: Recent Labs  Lab 03/14/23 0139 03/15/23 1105 03/16/23 0208 03/16/23 1750 03/17/23 0154 03/18/23 0749  WBC 6.7 6.3 5.0  --  4.5 4.3  HGB 11.5* 11.2* 11.8*  --  11.6* 10.8*  HCT 32.3* 32.4* 34.2*  --  33.6* 32.2*  MCV 83.7 85.5 84.9  --  85.9 88.0  PLT 107* 88* 53* 90* 83* 73*   BMP &GFR Recent Labs  Lab 03/14/23 0139 03/15/23 0954 03/16/23 0208 03/17/23 0154 03/18/23 0749  NA 137 139 140 143 144  K 3.9 5.4* 3.8 3.5 4.2  CL 110 108 110 112* 114*  CO2 24 23 24 24 25   GLUCOSE 122* 162* 130* 94 109*  BUN 27* 16 16 18 16   CREATININE 0.75 0.63 0.67 0.67 0.63  CALCIUM 8.5* 8.7* 8.7* 8.9 8.6*  MG 2.0 1.8 1.8 1.7 1.9  PHOS 2.1* 2.7 2.2* 2.7 2.5   Estimated Creatinine Clearance: 47.4 mL/min (by C-G formula based on SCr of 0.63 mg/dL). Liver & Pancreas: Recent Labs  Lab 03/14/23 0139 03/15/23 0954 03/16/23 0208 03/17/23 0154 03/18/23 0749  ALBUMIN 1.8* 1.8* 1.8* 1.9* 1.8*   No results for input(s): "LIPASE", "AMYLASE" in the last 168 hours.  No results for input(s): "AMMONIA" in the last 168 hours. Diabetic: No results for input(s): "HGBA1C" in the last 72 hours. Recent Labs  Lab 03/17/23 0742 03/17/23 1118 03/17/23 1604 03/17/23 2130 03/18/23 0832  GLUCAP 145* 167* 179* 142* 104*   Cardiac Enzymes: No results for input(s): "CKTOTAL", "CKMB", "CKMBINDEX", "TROPONINI" in the last 168 hours. No results for input(s): "PROBNP" in the last 8760 hours. Coagulation Profile: Recent Labs  Lab 03/16/23 1750  INR 1.1   Thyroid Function Tests: No results for input(s): "TSH", "T4TOTAL", "FREET4", "T3FREE", "THYROIDAB" in the last 72 hours. Lipid Profile: No results for input(s): "CHOL", "HDL", "LDLCALC", "TRIG", "CHOLHDL", "LDLDIRECT" in the last 72 hours. Anemia Panel: Recent  Labs    03/16/23 1614  VITAMINB12 1,668*  FOLATE 21.1   Urine analysis:    Component Value Date/Time   COLORURINE YELLOW 03/10/2023 0424   APPEARANCEUR CLEAR 03/10/2023 0424   LABSPEC 1.031 (H) 03/10/2023 0424   PHURINE 7.0 03/10/2023 0424   GLUCOSEU NEGATIVE 03/10/2023 0424   HGBUR SMALL (A) 03/10/2023 0424   BILIRUBINUR NEGATIVE 03/10/2023 0424   KETONESUR NEGATIVE 03/10/2023 0424   PROTEINUR NEGATIVE 03/10/2023 0424   UROBILINOGEN 0.2 03/23/2010 1156   NITRITE NEGATIVE 03/10/2023 0424   LEUKOCYTESUR NEGATIVE 03/10/2023 0424   Sepsis Labs: Invalid input(s): "PROCALCITONIN", "LACTICIDVEN"  Microbiology: Recent Results (from the past 240 hour(s))  Resp panel by RT-PCR (RSV, Flu A&B, Covid)  Anterior Nasal Swab     Status: None   Collection Time: 03/09/23 11:34 AM   Specimen: Anterior Nasal Swab  Result Value Ref Range Status   SARS Coronavirus 2 by RT PCR NEGATIVE NEGATIVE Final   Influenza A by PCR NEGATIVE NEGATIVE Final   Influenza B by PCR NEGATIVE NEGATIVE Final    Comment: (NOTE) The Xpert Xpress SARS-CoV-2/FLU/RSV plus assay is intended as an aid in the diagnosis of influenza from Nasopharyngeal swab specimens and should not be used as a sole basis for treatment. Nasal washings and aspirates are unacceptable for Xpert Xpress SARS-CoV-2/FLU/RSV testing.  Fact Sheet for Patients: EntrepreneurPulse.com.au  Fact Sheet for Healthcare Providers: IncredibleEmployment.be  This test is not yet approved or cleared by the [place] FDA and has been authorized for detection and/or diagnosis of SARS-CoV-2 by FDA under an Emergency Use Authorization (EUA). This EUA will remain in effect (meaning this test can be used) for the duration of the COVID-19 declaration under Section 564(b)(1) of the Act, 21 U.S.C. section 360bbb-3(b)(1), unless the authorization is terminated or revoked.     Resp Syncytial Virus by PCR NEGATIVE NEGATIVE  Final    Comment: (NOTE) Fact Sheet for Patients: EntrepreneurPulse.com.au  Fact Sheet for Healthcare Providers: IncredibleEmployment.be  This test is not yet approved or cleared by the [place] FDA and has been authorized for detection and/or diagnosis of SARS-CoV-2 by FDA under an Emergency Use Authorization (EUA). This EUA will remain in effect (meaning this test can be used) for the duration of the COVID-19 declaration under Section 564(b)(1) of the Act, 21 U.S.C. section 360bbb-3(b)(1), unless the authorization is terminated or revoked.  Performed at [facility], [place] [address] [place], [place] [address]     Radiology Studies: No results found.    Arissa Fagin T. [place]  If 7PM-7AM, please contact night-coverage [email or web address] 03/18/2023, 12:05 PM

## 2023-03-18 NOTE — TOC Progression Note (Signed)
Transition of Care [facility]) - Progression Note    Patient Details  Name: Amanda Green MRN: [id number] Date of Birth: May 25, 1935  Transition of Care [facility]) CM/SW [place], [place] Phone Number: 03/18/2023, 3:12 PM  Clinical Narrative:     CSW is notified that SN[facility] has been denied. Pt/family can pursue fast track appeal by calling [phone]. Fast track appeal fax# is [phone].   CSW updated SNF CSW called and updated pt's daughter. She will pursue appeal. CSW provided Appeal info and instructions.     Barriers to Discharge: Ship broker  Expected Discharge Plan and Services In-house Referral: Clinical Social Work     Living arrangements for the past 2 months: Single Family Home                                       Social Determinants of Health (SDOH) Interventions SDOH Screenings   Food Insecurity: No Food Insecurity (03/10/2023)  Housing: Low Risk  (03/10/2023)  Transportation Needs: No Transportation Needs (03/10/2023)  Utilities: Not At Risk (03/10/2023)  Tobacco Use: Low Risk  (03/09/2023)    Readmission Risk Interventions     No data to display

## 2023-03-18 NOTE — Progress Notes (Signed)
Physical Therapy Treatment Patient Details Name: Amanda Green MRN: [id number] DOB: 11-Aug-1935 Today's Date: 03/18/2023   History of Present Illness 88 y.o. 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    PT Comments    Pt with poor tolerance to treatment today. Pt continues to be limited by lethargy and was able to stand with Max A only for a seconds. Pt also in uncontrolled a fib with HR up to 180 while seated EOB. RN made aware. Majority of session focused on pericare with pt requiring Max A for bed mobility. No change in DC/DME recs at this time. PT will continue to follow.  Recommendations for follow up therapy are one component of a multi-disciplinary discharge planning process, led by the attending physician.  Recommendations may be updated based on patient status, additional functional criteria and insurance authorization.  Follow Up Recommendations  Can patient physically be transported by private vehicle: No    Assistance Recommended at Discharge Frequent or constant Supervision/Assistance  Patient can return home with the following Assistance with cooking/housework;Help with stairs or ramp for entrance;Assist for transportation;Two people to help with walking and/or transfers;A lot of help with bathing/dressing/bathroom   Equipment Recommendations  None recommended by PT    Recommendations for Other Services       Precautions / Restrictions Precautions Precautions: Fall Restrictions Weight Bearing Restrictions: No     Mobility  Bed Mobility Overal bed mobility: Needs Assistance Bed Mobility: Sit to Supine     Supine to sit: Max assist Sit to supine: Max assist         Transfers Overall transfer level: Needs assistance Equipment used: 2 person hand held assist Transfers: Sit to/from Stand Sit to Stand: Max assist           General transfer comment: Pt very lethargic and only able to stand for a few seconds.    Ambulation/Gait                   Stairs             Wheelchair Mobility    Modified Rankin (Stroke Patients Only)       Balance Overall balance assessment: Needs assistance Sitting-balance support: Bilateral upper extremity supported, Feet supported Sitting balance-Leahy Scale: Fair Sitting balance - Comments: Pt able to sit EOB for prolonged period of time.   Standing balance support: Bilateral upper extremity supported Standing balance-Leahy Scale: Poor                              Cognition Arousal/Alertness: Lethargic Behavior During Therapy: Flat affect Overall Cognitive Status: Difficult to assess                                 General Comments: overall WFL, flat affect, slowed processing, anticipate baseline        Exercises      General Comments General comments (skin integrity, edema, etc.): RN made aware that pt HR up to 180 while sitting EOB. Multiple family members present.      Pertinent Vitals/Pain Pain Assessment Pain  Assessment: No/denies pain    Home Living                          Prior Function            PT Goals (current goals can now be found in the care plan section) Progress towards PT goals: Progressing toward goals    Frequency    Min 1X/week      PT Plan Current plan remains appropriate    Co-evaluation              AM-PAC PT "6 Clicks" Mobility   Outcome Measure  Help needed turning from your back to your side while in a flat bed without using bedrails?: Total Help needed moving from lying on your back to sitting on the side of a flat bed without using bedrails?: Total Help needed moving to and from a bed  to a chair (including a wheelchair)?: Total Help needed standing up from a chair using your arms (e.g., wheelchair or bedside chair)?: Total Help needed to walk in hospital room?: Total Help needed climbing 3-5 steps with a railing? : Total 6 Click Score: 6    End of Session Equipment Utilized During Treatment: Gait belt Activity Tolerance: Patient limited by lethargy Patient left: in bed;with call bell/phone within reach;with family/visitor present;with nursing/sitter in room Nurse Communication: Mobility status;Other (comment) (HR) PT Visit Diagnosis: Unsteadiness on feet (R26.81);Other abnormalities of gait and mobility (R26.89);Muscle weakness (generalized) (M62.81);History of falling (Z91.81)     Time: [id number] PT Time Calculation (min) (ACUTE ONLY): 28 min  Charges:  $Therapeutic Activity: 23-37 mins                     Shelby Mattocks, PT, DPT Acute Rehab Services [id number]    Amanda Green 03/18/2023, 3:23 PM

## 2023-03-19 DIAGNOSIS — I4891 Unspecified atrial fibrillation: Secondary | ICD-10-CM | POA: Diagnosis not present

## 2023-03-19 DIAGNOSIS — D72829 Elevated white blood cell count, unspecified: Secondary | ICD-10-CM | POA: Diagnosis not present

## 2023-03-19 DIAGNOSIS — I10 Essential (primary) hypertension: Secondary | ICD-10-CM | POA: Diagnosis not present

## 2023-03-19 DIAGNOSIS — G9340 Encephalopathy, unspecified: Secondary | ICD-10-CM | POA: Diagnosis not present

## 2023-03-19 LAB — RENAL FUNCTION PANEL
Albumin: 1.8 g/dL — ABNORMAL LOW (ref 3.5–5.0)
Anion gap: 4 — ABNORMAL LOW (ref 5–15)
BUN: 16 mg/dL (ref 8–23)
CO2: 26 mmol/L (ref 22–32)
Calcium: 8.6 mg/dL — ABNORMAL LOW (ref 8.9–10.3)
Chloride: 114 mmol/L — ABNORMAL HIGH (ref 98–111)
Creatinine, Ser: 0.57 mg/dL (ref 0.44–1.00)
GFR, Estimated: >60 mL/min (ref >60)
Glucose, Bld: 139 mg/dL — ABNORMAL HIGH (ref 70–99)
Phosphorus: 2.2 mg/dL — ABNORMAL LOW (ref 2.5–4.6)
Potassium: 4 mmol/L (ref 3.5–5.1)
Sodium: 144 mmol/L (ref 135–145)

## 2023-03-19 LAB — CBC
HCT: 30.4 % — ABNORMAL LOW (ref 36.0–46.0)
Hemoglobin: 10.2 g/dL — ABNORMAL LOW (ref 12.0–15.0)
MCH: 29.3 pg (ref 26.0–34.0)
MCHC: 33.6 g/dL (ref 30.0–36.0)
MCV: 87.4 fL (ref 80.0–100.0)
Platelets: 74 10*3/uL — ABNORMAL LOW (ref 150–400)
RBC: 3.48 MIL/uL — ABNORMAL LOW (ref 3.87–5.11)
RDW: 18.1 % — ABNORMAL HIGH (ref 11.5–15.5)
WBC: 3.9 10*3/uL — ABNORMAL LOW (ref 4.0–10.5)
nRBC: 0 % (ref 0.0–0.2)

## 2023-03-19 LAB — GLUCOSE, CAPILLARY
Glucose-Capillary: 127 mg/dL — ABNORMAL HIGH (ref 70–99)
Glucose-Capillary: 148 mg/dL — ABNORMAL HIGH (ref 70–99)
Glucose-Capillary: 188 mg/dL — ABNORMAL HIGH (ref 70–99)
Glucose-Capillary: 199 mg/dL — ABNORMAL HIGH (ref 70–99)
Glucose-Capillary: 159 mg/dL — ABNORMAL HIGH (ref 70–99)

## 2023-03-19 LAB — MAGNESIUM: Magnesium: 1.8 mg/dL (ref 1.7–2.4)

## 2023-03-19 MED ORDER — METOPROLOL TARTRATE 12.5 MG HALF TABLET
12.5000 mg | ORAL_TABLET | Freq: Once | ORAL | Status: AC
  Administered 2023-03-19: 12.5 mg via ORAL
  Filled 2023-03-19: qty 1

## 2023-03-19 MED ORDER — METOPROLOL TARTRATE 50 MG PO TABS: 50.0000 mg | ORAL_TABLET | Freq: Two times a day (BID) | ORAL | Status: DC

## 2023-03-19 MED ORDER — METOPROLOL TARTRATE 50 MG PO TABS
50.0000 mg | ORAL_TABLET | Freq: Two times a day (BID) | ORAL | Status: DC
  Administered 2023-03-19 – 2023-03-25 (×13): 50 mg via ORAL
  Filled 2023-03-19 (×13): qty 1

## 2023-03-19 MED ORDER — AMIODARONE HCL 200 MG PO TABS
400.0000 mg | ORAL_TABLET | Freq: Two times a day (BID) | ORAL | Status: DC
  Administered 2023-03-19 – 2023-03-21 (×6): 400 mg via ORAL
  Filled 2023-03-19 (×6): qty 2

## 2023-03-19 MED ORDER — METOPROLOL TARTRATE 50 MG PO TABS: 50.0000 mg | ORAL_TABLET | Freq: Three times a day (TID) | ORAL | Status: DC

## 2023-03-19 NOTE — Progress Notes (Signed)
Occupational Therapy Treatment Patient Details Name: Amanda Green MRN: [id number] DOB: 1935/07/26 Today's Date: 03/19/2023   History of present illness 88 y.o. 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   OT comments  Patient with incremental progress toward patient focused goals.  She continues to need Max A for mobility and ADL completion, but this status is below her functional baseline, and the patient would benefit from a lower intensity post acute rehab, <3 hours/day to maximize her functional status so family can take her home.  The patient would need a hospital bed and hoyer lift at the home, for her to transition directly home.  Consistent daily post acute rehab, which cannot be provided in the acute setting, would at least give the patient the opportunity to progress with transfers, allowing the family to take her home without the needed DME.  The longer the patient remains in the acute setting, weaker she will become.  OT will continue efforts in the acute setting to address deficits.      Recommendations for follow up therapy are one component of a multi-disciplinary discharge planning process, led by the attending physician.  Recommendations may be updated based on patient status, additional functional criteria and insurance authorization.    Assistance Recommended at Discharge Frequent or constant Supervision/Assistance  Patient can return home with the following  A lot of help with bathing/dressing/bathroom;Assistance with cooking/housework;Direct supervision/assist for medications management;Direct supervision/assist for financial management;Assist for transportation;Help with stairs or ramp  for entrance;Two people to help with walking and/or transfers   [place] [facility] bed    Recommendations for Other Services      Precautions / Restrictions Precautions Precautions: Fall Precaution Comments: Watch HR Restrictions Weight Bearing Restrictions: No       Mobility Bed Mobility Overal bed mobility: Needs Assistance Bed Mobility: Supine to Sit     Supine to sit: Max assist, Mod assist          Transfers Overall transfer level: Needs assistance Equipment used: 2 person hand held assist Transfers: Sit to/from Stand Sit to Stand: Max assist Stand pivot transfers: Max assist               Balance Overall balance assessment: Needs assistance Sitting-balance support: Bilateral upper extremity supported, Feet supported Sitting balance-Leahy Scale: Poor   Postural control: Posterior lean Standing balance support: Bilateral upper extremity supported Standing balance-Leahy Scale: Poor                             ADL either performed or assessed with clinical judgement   ADL       Grooming: Wash/dry hands;Wash/dry face;Set up;Sitting       Lower Body Bathing: Maximal assistance;Bed level   Upper Body Dressing : Moderate assistance;Sitting   Lower Body Dressing: Maximal assistance;Bed level       Toileting- Clothing Manipulation and Hygiene: Total assistance;Bed level              Extremity/Trunk Assessment Upper Extremity Assessment Upper Extremity Assessment: Generalized weakness   Lower Extremity Assessment Lower Extremity Assessment: Defer to PT evaluation   Cervical / Trunk Assessment Cervical / Trunk  Assessment: Kyphotic                      Cognition Arousal/Alertness: Awake/alert Behavior During Therapy: Flat affect Overall Cognitive Status: History of cognitive impairments - at baseline                                                       General Comments   Remains in a-fib    Pertinent Vitals/ Pain       Pain Assessment Pain Assessment: No/denies pain Pain Intervention(s): Monitored during session                                                          Frequency  Min 2X/week        Progress Toward Goals  OT Goals(current goals can now be found in the care plan section)     Acute Rehab OT Goals OT Goal Formulation: With patient Time For Goal Achievement: 03/28/23 Potential to Achieve Goals: Good  Plan Discharge plan remains appropriate    Co-evaluation                 AM-PAC OT "6 Clicks" Daily Activity     Outcome Measure   Help from another person eating meals?: A Little Help from another person taking care of personal grooming?: A Little Help from another person toileting, which includes using toliet, bedpan, or urinal?: A Lot Help from another person bathing (including washing, rinsing, drying)?: A Lot Help from another person to put on and taking off regular upper body clothing?: A Lot Help from another person to put on and taking off regular lower body clothing?: A Lot 6 Click Score: 14    End of Session Equipment Utilized During Treatment: Gait belt  OT Visit Diagnosis: Unsteadiness on feet (R26.81);Other abnormalities of gait and mobility (R26.89);Muscle weakness (generalized) (M62.81);History of falling (Z91.81)   Activity Tolerance Patient limited by fatigue   Patient Left in chair;with call bell/phone within reach;with family/visitor present;with chair alarm set   Nurse Communication Mobility status;Need for lift equipment        Time: 1002-1024 OT Time Calculation (min): 22 min  Charges: OT General Charges $OT Visit: 1 Visit OT Treatments $Self Care/Home Management : 8-22 mins  03/19/2023  RP, OTR/L  Acute Rehabilitation Services  Office:  [phone]   Metta Clines 03/19/2023, 10:29 AM

## 2023-03-19 NOTE — TOC Progression Note (Signed)
Transition of Care [facility]) - Progression Note    Patient Details  Name: Amanda Green MRN: [id number] Date of Birth: [birth year]  Transition of Care [facility]) CM/SW [place], [place] Phone Number: 03/19/2023, 2:17 PM  Clinical Narrative:     CSW received voicemail from pt daughter requesting appeal phone number as she misplaced it. CSW provided her that number again. Daughter states she plans to call now.     Barriers to Discharge: Ship broker  Expected Discharge Plan and Services In-house Referral: Clinical Social Work     Living arrangements for the past 2 months: Single Family Home                                       Social Determinants of Health (SDOH) Interventions SDOH Screenings   Food Insecurity: No Food Insecurity (03/10/2023)  Housing: Low Risk  (03/10/2023)  Transportation Needs: No Transportation Needs (03/10/2023)  Utilities: Not At Risk (03/10/2023)  Tobacco Use: Low Risk  (03/09/2023)    Readmission Risk Interventions     No data to display

## 2023-03-19 NOTE — Progress Notes (Signed)
PROGRESS NOTE  Amanda Green [id number] DOB: [birth year]   PCP: Nicholas Lose, MD  Patient is from: Home  DOA: 03/09/2023 LOS: 79  Chief complaints Chief Complaint  Patient presents with   Leg Swelling     Brief Narrative / Interim history: 88 year old F with PMH of BRCA with brain mets s/p bilateral mastectomy, DM-2, dementia, HTN, CVA and cardiac amyloidosis presenting with shortness of breath and edema and admitted for A-fib with RVR, acute diastolic CHF and AKI.  Patient was started on IV Lasix for CHF.  Cardiology consulted and started on digoxin for rate control due to marginal blood pressures.  However, digoxin held due to AKI.  Diuretics held due to soft blood pressures.  Patient continued to decline despite all interventions.  Continued to be fatigued and lethargic.  Palliative medicine consulted.  Patient and family wanted to continue full code with full scope of care.  Lethargy, A-fib and hypotension improving.  Still with mild RVR.  Adjusting meds.  Oncology consulted.   Hospital course complicated by acute urinary retention and thrombocytopenia.  Foley catheter placed.  Oncology consulted for thrombocytopenia which is now improving.    Difficult to control A-fib.  Cardiology consulted back.  Started p.o. amiodarone loading dose.  Therapy recommended SNF but insurance declined.  Family appealing.   Subjective: Seen and examined earlier this morning.  No major events overnight of this morning.  No complaints.  Daughter at bedside.  Remains in A-fib with RVR to 120s.  Does not seem to be symptomatic.  Objective: Vitals:   03/19/23 0459 03/19/23 0613 03/19/23 0839 03/19/23 1046  BP: 119/70 130/83 132/82   Pulse: (!) 113 (!) 119 78 (!) 121  Resp: (!) 23  (!) 21   Temp: 97.6 F (36.4 C)  98.8 F (37.1 C)   TempSrc: Axillary  Oral   SpO2: 98%  97%   Weight: 71.8 kg     Height:        Examination: GENERAL: No apparent distress.  Nontoxic. HEENT: MMM.  Vision  and hearing grossly intact.  NECK: Supple.  No apparent JVD.  RESP:  No IWOB.  Fair aeration bilaterally. CVS: Irregular rhythm.  HR 120s.Marland Kitchen Heart sounds normal.  ABD/GI/GU: BS+. Abd soft, NTND.  MSK/EXT:   No apparent deformity.  BLE weakness.  Trace edema. SKIN: no apparent skin lesion or wound NEURO: Awake and alert. Oriented fairly.  No apparent focal neuro deficit. PSYCH: Calm. Normal affect.    Procedures:  None  Microbiology summarized: [id number], influenza and RSV PCR nonreactive.  Assessment and plan: Principal Problem:   Atrial fibrillation with RVR Active Problems:   Acute on chronic diastolic CHF (congestive heart failure)   Hyponatremia   Elevated troponin   Ductal carcinoma in situ (DCIS) of left breast   Cardiac amyloidosis   Controlled type 2 diabetes mellitus without complication, without long-term current use of insulin   Essential hypertension   Cognitive impairment   Leukocytosis   Encephalopathy acute   Elevated d-dimer   Pressure injury of skin  A-fib with RVR: Initially started on Cardizem drip that was discontinued due to hypotension.  BP improved.  Started on digoxin and metoprolol but remained in RVR. -Cardiology consulted-started p.o. amiodarone loading dose and increase metoprolol to 50 mg twice daily -Anticoagulation discontinued due to risk of brain bleed in the setting of brain mets and thrombocytopenia   Acute on chronic diastolic CHF: Grossly euvolemic except for trace BLE edema. Cardiac amyloidosis  -Follow echocardiogram -Continue  tafamidis -Continue holding diuretics. -Strict I's and O's, daily weight, fluid restriction -Ted hose    AKI: Likely prerenal from hypotension and diuretics.  Renal US negative.  Resolved. Recent Labs    03/10/23 0233 03/11/23 0156 03/12/23 0231 03/13/23 0232 03/14/23 0139 03/15/23 0954 03/16/23 0208 03/17/23 0154 03/18/23 0749 03/19/23 0643  BUN 32* 45* 59* 43* 27* 16 16 18 16 16   CREATININE 0.90  1.36* 1.89* 1.00 0.75 0.63 0.67 0.67 0.63 0.57  -Continue holding nephrotoxic  Acute urinary retention/hematuria: Seems like she has significant urinary tension with bladder over distention the night of 3/26.  Hematuria likely traumatic from I and O cath.  Renal US negative. -Continue Foley catheter for 7 to 10 days   Acute metabolic encephalopathy/underlying dementia: Awake, alert and oriented to self, place and family.  CT head without acute finding.  Recent MRI with brain mass concerning for metastasis -Reorientation and delirium precaution -Continue home Namenda.  Breast cancer with brain mets s/p brain radiation.  Last radiation on 3/20.  -Continue Decadron to 2 mg daily  -Per oncology and radiation oncology   Controlled DM-2 with hyperglycemia: A1c 6.5% in 01/2023. Recent Labs  Lab 03/18/23 1226 03/18/23 1644 03/18/23 2131 03/19/23 0838 03/19/23 1021  GLUCAP 172* 154* 192* 127* 148*  -Continue current insulin regimen  Essential hypertension: Normotensive this morning. -Metoprolol as above.  Demand ischemia: In setting of A Fib RVR and CHF  -Follow echocardiogram   Goals of care: Patient with multiple complicated comorbidities as above.  Poor prognosis.  Palliative medicine consulted.  Patient and family likes to pursue full code with full scope of care -Palliative medicine signed off.  Thrombocytopenia: Has not received heparin or heparin products.  Not on chemo or other medication that could contribute to thrombocytopenia as far as I can tell.  DIC labs with elevated D-dimer.  Started to improve. Recent Labs  Lab 03/13/23 0232 03/14/23 0139 03/15/23 1105 03/16/23 0208 03/16/23 1750 03/17/23 0154 03/18/23 0749 03/19/23 0643  PLT 116* 107* 88* 53* 90* 83* 73* 74*  -Appreciate input by hematology  Elevated D-dimer: Recent CTA chest negative for PE.  LE venous Doppler negative for DVT.  Mild hyperkalemia: Resolved  Physical deconditioning -PT/OT-final  disposition SNF.  Body mass index is 28.06 kg/m.  Pressure skin injury: POA Pressure Injury 03/09/23 Buttocks Right Stage 2 -  Partial thickness loss of dermis presenting as a shallow open injury with a red, pink wound bed without slough. less than 1cm size. pink/red (Active)  03/09/23 1854  Location: Buttocks  Location Orientation: Right  Staging: Stage 2 -  Partial thickness loss of dermis presenting as a shallow open injury with a red, pink wound bed without slough.  Wound Description (Comments): less than 1cm size. pink/red  Present on Admission: Yes  Dressing Type Foam - Lift dressing to assess site every shift 03/19/23 0800   DVT prophylaxis:  Place and maintain sequential compression device Start: 03/13/23 1058 Place TED hose Start: 03/12/23 1159 Place TED hose Start: 03/09/23 1723  Code Status: Full code Family Communication: Updated patient's daughters at bedside. Level of care: Telemetry Medical Status is: Inpatient Remains inpatient appropriate because: A-fib with RVR, thrombocytopenia   Final disposition: SNF Consultants:  Cardiology Palliative medicine Hematology/oncology  35 minutes with more than 50% spent in reviewing records, counseling patient/family and coordinating care.   Sch Meds:  Scheduled Meds:  amiodarone  400 mg Oral BID   brimonidine  1 drop Left Eye BID   And  timolol  1 drop Left Eye BID   Chlorhexidine Gluconate Cloth  6 each Topical Daily   dexamethasone (DECADRON) injection  2 mg Intravenous Q24H   dorzolamide  1 drop Left Eye BID   Gerhardt's butt cream   Topical BID   insulin aspart  0-9 Units Subcutaneous TID WC   metoprolol tartrate  50 mg Oral BID   ofloxacin  1 drop Right Eye QID   prednisoLONE acetate  1 drop Right Eye QID   Tafamidis  61 mg Oral Daily   Continuous Infusions:  sodium chloride 5 mL/hr at 03/14/23 1753   lactated ringers 75 mL/hr at 03/19/23 0630   PRN Meds:.sodium chloride, acetaminophen **OR**  acetaminophen, mouth rinse  Antimicrobials: Anti-infectives (From admission, onward)    None        I have personally reviewed the following labs and images: CBC: Recent Labs  Lab 03/15/23 1105 03/16/23 0208 03/16/23 1750 03/17/23 0154 03/18/23 0749 03/19/23 0643  WBC 6.3 5.0  --  4.5 4.3 3.9*  HGB 11.2* 11.8*  --  11.6* 10.8* 10.2*  HCT 32.4* 34.2*  --  33.6* 32.2* 30.4*  MCV 85.5 84.9  --  85.9 88.0 87.4  PLT 88* 53* 90* 83* 73* 74*   BMP &GFR Recent Labs  Lab 03/15/23 0954 03/16/23 0208 03/17/23 0154 03/18/23 0749 03/19/23 0643  NA 139 140 143 144 144  K 5.4* 3.8 3.5 4.2 4.0  CL 108 110 112* 114* 114*  CO2 23 24 24 25 26   GLUCOSE 162* 130* 94 109* 139*  BUN 16 16 18 16 16   CREATININE 0.63 0.67 0.67 0.63 0.57  CALCIUM 8.7* 8.7* 8.9 8.6* 8.6*  MG 1.8 1.8 1.7 1.9 1.8  PHOS 2.7 2.2* 2.7 2.5 2.2*   Estimated Creatinine Clearance: 48 mL/min (by C-G formula based on SCr of 0.57 mg/dL). Liver & Pancreas: Recent Labs  Lab 03/15/23 0954 03/16/23 0208 03/17/23 0154 03/18/23 0749 03/19/23 0643  ALBUMIN 1.8* 1.8* 1.9* 1.8* 1.8*   No results for input(s): "LIPASE", "AMYLASE" in the last 168 hours.  No results for input(s): "AMMONIA" in the last 168 hours. Diabetic: No results for input(s): "HGBA1C" in the last 72 hours. Recent Labs  Lab 03/18/23 1226 03/18/23 1644 03/18/23 2131 03/19/23 0838 03/19/23 1021  GLUCAP 172* 154* 192* 127* 148*   Cardiac Enzymes: No results for input(s): "CKTOTAL", "CKMB", "CKMBINDEX", "TROPONINI" in the last 168 hours. No results for input(s): "PROBNP" in the last 8760 hours. Coagulation Profile: Recent Labs  Lab 03/16/23 1750  INR 1.1   Thyroid Function Tests: No results for input(s): "TSH", "T4TOTAL", "FREET4", "T3FREE", "THYROIDAB" in the last 72 hours. Lipid Profile: No results for input(s): "CHOL", "HDL", "LDLCALC", "TRIG", "CHOLHDL", "LDLDIRECT" in the last 72 hours. Anemia Panel: Recent Labs     03/16/23 1614  VITAMINB12 1,668*  FOLATE 21.1   Urine analysis:    Component Value Date/Time   COLORURINE YELLOW 03/10/2023 0424   APPEARANCEUR CLEAR 03/10/2023 0424   LABSPEC 1.031 (H) 03/10/2023 0424   PHURINE 7.0 03/10/2023 0424   GLUCOSEU NEGATIVE 03/10/2023 0424   HGBUR SMALL (A) 03/10/2023 0424   BILIRUBINUR NEGATIVE 03/10/2023 0424   KETONESUR NEGATIVE 03/10/2023 0424   PROTEINUR NEGATIVE 03/10/2023 0424   UROBILINOGEN 0.2 03/23/2010 1156   NITRITE NEGATIVE 03/10/2023 0424   LEUKOCYTESUR NEGATIVE 03/10/2023 0424   Sepsis Labs: Invalid input(s): "PROCALCITONIN", "LACTICIDVEN"  Microbiology: No results found for this or any previous visit (from the past 240 hour(s)).   Radiology Studies:  No results found.    Shameca Landen T. [place]  If 7PM-7AM, please contact night-coverage [email or web address] 03/19/2023, 11:50 AM

## 2023-03-19 NOTE — Progress Notes (Signed)
Rounding Note    Patient Name: Amanda Green Date of Encounter: 03/19/2023  [facility] Cardiologist: Sanda Klein, MD   Subjective   No CP or dyspnea  Inpatient Medications    Scheduled Meds:  brimonidine  1 drop Left Eye BID   And   timolol  1 drop Left Eye BID   Chlorhexidine Gluconate Cloth  6 each Topical Daily   dexamethasone (DECADRON) injection  2 mg Intravenous Q24H   digoxin  0.125 mg Oral Daily   dorzolamide  1 drop Left Eye BID   Gerhardt's butt cream   Topical BID   insulin aspart  0-9 Units Subcutaneous TID WC   metoprolol tartrate  37.5 mg Oral TID   ofloxacin  1 drop Right Eye QID   prednisoLONE acetate  1 drop Right Eye QID   Tafamidis  61 mg Oral Daily   Continuous Infusions:  sodium chloride 5 mL/hr at 03/14/23 1753   lactated ringers 75 mL/hr at 03/19/23 0630   PRN Meds: sodium chloride, acetaminophen **OR** acetaminophen, mouth rinse   Vital Signs    Vitals:   03/19/23 0459 03/19/23 0613 03/19/23 0839 03/19/23 1046  BP: 119/70 130/83 132/82   Pulse: (!) 113 (!) 119 78 (!) 121  Resp: (!) 23  (!) 21   Temp: 97.6 F (36.4 C)  98.8 F (37.1 C)   TempSrc: Axillary  Oral   SpO2: 98%  97%   Weight: 71.8 kg     Height:        Intake/Output Summary (Last 24 hours) at 03/19/2023 1142 Last data filed at 03/19/2023 0630 Gross per 24 hour  Intake 1321.94 ml  Output 550 ml  Net 771.94 ml      03/19/2023    4:59 AM 03/18/2023    4:45 AM 03/17/2023    4:58 AM  Last 3 Weights  Weight (lbs) 158 lb 6.4 oz 154 lb 8.7 oz 158 lb 4.6 oz  Weight (kg) 71.85 kg 70.1 kg 71.8 kg      Telemetry    Atrial fibrillation with RVR - Personally Reviewed   Physical Exam   GEN: No acute distress.   Neck: No JVD Cardiac: irregular and tachycardic Respiratory: Clear to auscultation bilaterally. GI: Soft, nontender, non-distended  MS: No edema Neuro:  Nonfocal  Psych: Normal affect   Labs    High Sensitivity Troponin:   Recent Labs  Lab  03/09/23 1204 03/09/23 1402  TROPONINIHS 72* 51*     Chemistry Recent Labs  Lab 03/17/23 0154 03/18/23 0749 03/19/23 0643  NA 143 144 144  K 3.5 4.2 4.0  CL 112* 114* 114*  CO2 24 25 26   GLUCOSE 94 109* 139*  BUN 18 16 16   CREATININE 0.67 0.63 0.57  CALCIUM 8.9 8.6* 8.6*  MG 1.7 1.9 1.8  ALBUMIN 1.9* 1.8* 1.8*  GFRNONAA >60 >60 >60  ANIONGAP 7 5 4*    Hematology Recent Labs  Lab 03/17/23 0154 03/18/23 0749 03/19/23 0643  WBC 4.5 4.3 3.9*  RBC 3.91 3.66* 3.48*  HGB 11.6* 10.8* 10.2*  HCT 33.6* 32.2* 30.4*  MCV 85.9 88.0 87.4  MCH 29.7 29.5 29.3  MCHC 34.5 33.5 33.6  RDW 18.1* 18.3* 18.1*  PLT 83* 73* 74*    DDimer  Recent Labs  Lab 03/16/23 1750  DDIMER 11.36*     Patient Profile     88 y.o. female with a history of cardiac amyloidosis on Tafamis, paroxysmal atrial fibrillation on Eliquis, hypertension,  hyperlipidemia, type 2 diabetes mellitus, prior CVA, memory loss, and breast cancer s/p right mastectomy in 1999 and left mastectomy in 2022 now with likely metastasis to the brain based on brain MRI in 01/2023 who was admitted on 03/09/2023 for atrial fibrillation with RVR after presenting with increased shortness of breath and swelling.  Last echocardiogram April 2022 showed hyperdynamic LV function, severe left ventricular hypertrophy and pattern concerning for amyloidosis, grade 1 diastolic dysfunction, mild left atrial enlargement.  Assessment & Plan   1 atrial fibrillation with rapid ventricular response-patient's rate remains elevated.  Blood pressure with some improvement.  Increase metoprolol to 50 mg twice daily.  Discontinue digoxin.  Add amiodarone 400 mg twice daily for 1 week then decrease to 200 mg daily thereafter.  Patient is not on anticoagulation given history of metastatic disease to her brain and risk of hemorrhage.  2 cardiac amyloid-continue tafamidis.  3 acute on chronic diastolic congestive heart failure-patient remains euvolemic on  examination.  Can add Lasix as needed following discharge.  Other issues per primary care.  Prognosis is poor.  For questions or updates, please contact [place] Please consult [email or web address] for contact info under        Signed, Kirk Ruths, MD  03/19/2023, 11:42 AM

## 2023-03-20 ENCOUNTER — Other Ambulatory Visit (HOSPITAL_COMMUNITY): Payer: BC Managed Care – PPO

## 2023-03-20 DIAGNOSIS — I5033 Acute on chronic diastolic (congestive) heart failure: Secondary | ICD-10-CM | POA: Diagnosis not present

## 2023-03-20 DIAGNOSIS — N179 Acute kidney failure, unspecified: Secondary | ICD-10-CM | POA: Diagnosis not present

## 2023-03-20 DIAGNOSIS — D0512 Intraductal carcinoma in situ of left breast: Secondary | ICD-10-CM | POA: Diagnosis not present

## 2023-03-20 DIAGNOSIS — I4891 Unspecified atrial fibrillation: Secondary | ICD-10-CM | POA: Diagnosis not present

## 2023-03-20 LAB — GLUCOSE, CAPILLARY
Glucose-Capillary: 135 mg/dL — ABNORMAL HIGH (ref 70–99)
Glucose-Capillary: 169 mg/dL — ABNORMAL HIGH (ref 70–99)
Glucose-Capillary: 235 mg/dL — ABNORMAL HIGH (ref 70–99)
Glucose-Capillary: 198 mg/dL — ABNORMAL HIGH (ref 70–99)

## 2023-03-20 NOTE — Assessment & Plan Note (Addendum)
Plan to continue amiodarone for rate control Risk vs benefit, anticoagulation on hold due to brain mets and thrombocytopenia.  Ok to discontinue telemetry.

## 2023-03-20 NOTE — Assessment & Plan Note (Addendum)
Pressure Injury 03/09/23 Buttocks Right Stage 2 -  Partial thickness loss of dermis presenting as a shallow open injury with a red, pink wound bed without slough. less than 1cm size. pink/red (Active)  03/09/23 1854  Location: Buttocks  Location Orientation: Right  Staging: Stage 2 -  Partial thickness loss of dermis presenting as a shallow open injury with a red, pink wound bed without slough.  Wound Description (Comments): less than 1cm size. pink/red  Present on Admission: Yes  Dressing Type Foam - Lift dressing to assess site every shift 03/24/23 2000  Present on admission.

## 2023-03-20 NOTE — Hospital Course (Addendum)
Amanda Green was admitted to the hospital with the working diagnosis of atrial fibrillation with RVR complicated with heart failure exacerbation.   88 yo female with the past medical history of breast cancer complicated with brain metastasis, T2DM, hypertension, dyslipidemia, cardiac amyloid and history of CVA who presented with dyspnea and lower extremity edema. Progressive edema at home, not able to move due to edema and dyspnea on exertion. On her initial physical examination her blood pressure was 121/83, HR 109, RR 18 and 02 saturation 93%, lungs with no wheezing or rales, heart with S1 and S2 present irregularly irregular with no gallops, rubs or murmurs, abdomen with no distention, and positive lower extremity edema.   Patient was treated with diuretics and AV blockade. Progressive decline in her health. Palliative care was consulted.  Positive urinary retention requiring foley catheter.  Oncology consulted for thrombocytopenia.  Insurance declined SNF but family has appealed.   Rate control atrial fibrillation with amiodarone, no anticoagulation due to bleeding risk.   Patient medically stable. Echocardiogram with preserved LV systolic function.   04/06 patient medically stable, pending appeal for SNF. If denied plan to arrange home health services.  04/07 patient has been approved for SNF, pending bed to be available.

## 2023-03-20 NOTE — Assessment & Plan Note (Addendum)
Echocardiogram with preserved LV systolic function 60 to 65%, mild LVH, RV systolic function preserved, LA with mild dilatation, RVSP 58.1 mmHg, no significant valvular disease.   Elevated troponin due to heart failure exacerbation and demand ischemia.   Patient was diuresed with IV furosemide, negative fluid balance was achieved, -13,169 ml, with significant improvement in her symptoms.   Continue with metoprolol, losartan, furosemide and spironolactone.   No SGLT 2 inh due to urinary retention and risk for urinary tract infection.  Tafamidis for TTR Amyloidosis.

## 2023-03-20 NOTE — TOC Progression Note (Signed)
Transition of Care [facility]) - Progression Note    Patient Details  Name: Amanda Green MRN: [id number] Date of Birth: 1935-08-05  Transition of Care [facility]) CM/SW [place], [place] Phone Number: 03/20/2023, 11:03 AM  Clinical Narrative:     CSW spoke with [place] patients daughter to follow up on progress of appeal. Belinda informed CSW that she started appeal this morning and that patients insurance informed her that appeal will take up to 72 hours for determination. Patients daughter confirmed she will follow up with CSW when she hears back from patients appeal determination. CSW will continue to follow and assist with patients dc planning needs.    Barriers to Discharge: Ship broker  Expected Discharge Plan and Services In-house Referral: Clinical Social Work     Living arrangements for the past 2 months: Single Family Home                                       Social Determinants of Health (SDOH) Interventions SDOH Screenings   Food Insecurity: No Food Insecurity (03/10/2023)  Housing: Low Risk  (03/10/2023)  Transportation Needs: No Transportation Needs (03/10/2023)  Utilities: Not At Risk (03/10/2023)  Tobacco Use: Low Risk  (03/09/2023)    Readmission Risk Interventions     No data to display

## 2023-03-20 NOTE — Progress Notes (Signed)
Progress Note   Patient: Amanda Green [id number] DOB: December 02, 1935 DOA: 03/09/2023     11 DOS: the patient was seen and examined on 03/20/2023   Brief hospital course: Mrs. Byers was admitted to the hospital with the working diagnosis of atrial fibrillation with RVR complicated with heart failure exacerbation.   88 yo female with the past medical history of breast cancer complicated with brain metastasis, T2DM, hypertension, dyslipidemia, cardiac amyloid and history of CVA who presented with dyspnea and lower extremity edema. Progressive edema at home, not able to move due to edema and dyspnea on exertion. On her initial physical examination her blood pressure was 121/83, HR 109, RR 18 and 02 saturation 93%, lungs with no wheezing or rales, heart with S1 and S2 present irregularly irregular with no gallops, rubs or murmurs, abdomen with no distention, and positive lower extremity edema.   Patient was treated with diuretics and AV blockade. Progressive decline in her health. Palliative care was consulted.  Positive urinary retention requiring foley catheter.  Oncology consulted for thrombocytopenia.  Insurance declined SNF but family has appealed.   Rate control atrial fibrillation with amiodarone, no anticoagulation due to bleeding risk.     Assessment and Plan: * Atrial fibrillation with RVR Patient continue atrial fibrillation with rate 90 to 100.  Plan to continue amiodarone for rate control Risk vs benefit, anticoagulation on hold due to brain mets and thrombocytopenia.  Continue telemetry monitoring.   Acute on chronic diastolic CHF (congestive heart failure) Echocardiogram with sever LV wall thickening. EF 75%, severe LVH, RV systolic function preserved, RVSP 32.8 trivial pericardial effusion. No significant valvular disease,   Urine output is [id number] Systolic blood pressure [id number] to 140 mmHg.   Continue with metoprolol Tafamidis for amyloidosis.   AKI (acute kidney  injury) Hyponatremia.   Renal function with serum cr at 0,57, K is 4,0 and serum bicarbonate aet 26, Plan to continue close monitoring of renal function and electrolytes.   Acute urinary retention, continue with foley catheter.   Elevated troponin She has no chest pain or acute changes on EKG besides afib with RVR Likely demand ischemia in setting of afib with RVR Down trending Continue tele   Ductal carcinoma in situ (DCIS) of left breast Brain metastasis. Continue with dexamethasone.   Thrombocytopenia, with stable plt.   Cardiac amyloidosis TTE 2022 with LVEF greater than [id number], grade 1 diastolic dysfunction, severe LVH. Continue tafamidis 61 mg p.o. daily    Controlled type 2 diabetes mellitus without complication, without long-term current use of insulin Glucose has been stable, fasting this am 139, Continue insulin sliding scale for glucose cover and monitoring.   Essential hypertension Blood pressure stable, continue with metoprolol.   Cognitive impairment Continue namenda Bid  Delerium precautions   Pressure injury of skin Continue wound care.   Leukocytosis Secondary to steroids No s/sx of infectious etiology         Subjective: patient very weak and deconditioned, no chest pain or dyspnea, no palpitations   Physical Exam:  Neurology awake and alert ENT with mild pallor Cardiovascular with S1 and S2 present, irregularly irregular with no gallops, rubs or murmurs No JVD Trace lower extremity edema Respiratory with no rales or wheezing on anterior auscultation Abdomen with no distention   Data Reviewed:    Family Communication: I spoke with patient's daughter at the bedside, we talked in detail about patient's condition, plan of care and prognosis and all questions were addressed.   Disposition: Status is:  Inpatient Remains inpatient appropriate because: pending appeal from SNF  Planned Discharge Destination: Home and Skilled nursing  facility      Author: Tawni Millers, MD 03/20/2023 10:15 AM  For on call review [email or web address].

## 2023-03-20 NOTE — Progress Notes (Signed)
Rounding Note    Patient Name: Amanda Green Date of Encounter: 03/20/2023  [place] Cardiologist: Sanda Klein, MD   Subjective   Pt denies CP or dyspnea  Inpatient Medications    Scheduled Meds:  amiodarone  400 mg Oral BID   brimonidine  1 drop Left Eye BID   And   timolol  1 drop Left Eye BID   Chlorhexidine Gluconate Cloth  6 each Topical Daily   dexamethasone (DECADRON) injection  2 mg Intravenous Q24H   dorzolamide  1 drop Left Eye BID   Gerhardt's butt cream   Topical BID   insulin aspart  0-9 Units Subcutaneous TID WC   metoprolol tartrate  50 mg Oral BID   ofloxacin  1 drop Right Eye QID   prednisoLONE acetate  1 drop Right Eye QID   Tafamidis  61 mg Oral Daily   Continuous Infusions:  sodium chloride 5 mL/hr at 03/14/23 1753   PRN Meds: sodium chloride, acetaminophen **OR** acetaminophen, mouth rinse   Vital Signs    Vitals:   03/19/23 1957 03/19/23 2133 03/20/23 0031 03/20/23 0444  BP: 126/89 129/83 131/83 (!) 141/85  Pulse:  (!) 119    Resp: 20  20 16   Temp: 98.5 F (36.9 C)  97.8 F (36.6 C) 97.9 F (36.6 C)  TempSrc: Oral  Oral Oral  SpO2: 98%  99% 100%  Weight:    70.1 kg  Height:        Intake/Output Summary (Last 24 hours) at 03/20/2023 0740 Last data filed at 03/20/2023 0500 Gross per 24 hour  Intake 476.08 ml  Output 925 ml  Net -448.92 ml       03/20/2023    4:44 AM 03/19/2023    4:59 AM 03/18/2023    4:45 AM  Last 3 Weights  Weight (lbs) 154 lb 8.7 oz 158 lb 6.4 oz 154 lb 8.7 oz  Weight (kg) 70.1 kg 71.85 kg 70.1 kg      Telemetry    Atrial fibrillation; rate mildly elevated - Personally Reviewed   Physical Exam   GEN: NAD Neck: Supple Cardiac: irregular and tachycardic Respiratory: CTA GI: Soft, NT/ND MS: No edema Neuro:  Grossly intact Psych: Normal affect   Labs    High Sensitivity Troponin:   Recent Labs  Lab 03/09/23 1204 03/09/23 1402  TROPONINIHS 72* 51*      Chemistry Recent  Labs  Lab 03/17/23 0154 03/18/23 0749 03/19/23 0643  NA 143 144 144  K 3.5 4.2 4.0  CL 112* 114* 114*  CO2 24 25 26   GLUCOSE 94 109* 139*  BUN 18 16 16   CREATININE 0.67 0.63 0.57  CALCIUM 8.9 8.6* 8.6*  MG 1.7 1.9 1.8  ALBUMIN 1.9* 1.8* 1.8*  GFRNONAA >60 >60 >60  ANIONGAP 7 5 4*     Hematology Recent Labs  Lab 03/17/23 0154 03/18/23 0749 03/19/23 0643  WBC 4.5 4.3 3.9*  RBC 3.91 3.66* 3.48*  HGB 11.6* 10.8* 10.2*  HCT 33.6* 32.2* 30.4*  MCV 85.9 88.0 87.4  MCH 29.7 29.5 29.3  MCHC 34.5 33.5 33.6  RDW 18.1* 18.3* 18.1*  PLT 83* 73* 74*     DDimer  Recent Labs  Lab 03/16/23 1750  DDIMER 11.36*      Patient Profile     88 y.o. female with a history of cardiac amyloidosis on Tafamis, paroxysmal atrial fibrillation on Eliquis, hypertension, hyperlipidemia, type 2 diabetes mellitus, prior CVA, memory loss, and breast cancer  s/p right mastectomy in 1999 and left mastectomy in 2022 now with likely metastasis to the brain based on brain MRI in 01/2023 who was admitted on 03/09/2023 for atrial fibrillation with RVR after presenting with increased shortness of breath and swelling.  Last echocardiogram April 2022 showed hyperdynamic LV function, severe left ventricular hypertrophy and pattern concerning for amyloidosis, grade 1 diastolic dysfunction, mild left atrial enlargement.  Assessment & Plan   1 atrial fibrillation with rapid ventricular response-heart rate is improving today.  Continue metoprolol 50 mg twice daily.  Continue amiodarone as outlined (400 mg twice daily for 1 week then 200 mg daily thereafter). Patient is not on anticoagulation given history of metastatic disease to her brain and risk of hemorrhage.  2 cardiac amyloid-continue tafamidis.  3 acute on chronic diastolic congestive heart failure-patient remains euvolemic on examination.  Can add Lasix as needed following discharge.  For questions or updates, please contact [place] Please  consult [email or web address] for contact info under        Signed, Kirk Ruths, MD  03/20/2023, 7:40 AM

## 2023-03-21 ENCOUNTER — Inpatient Hospital Stay (HOSPITAL_COMMUNITY): Payer: Medicare PPO

## 2023-03-21 ENCOUNTER — Ambulatory Visit: Payer: BC Managed Care – PPO | Admitting: Hematology and Oncology

## 2023-03-21 DIAGNOSIS — I4891 Unspecified atrial fibrillation: Secondary | ICD-10-CM | POA: Diagnosis not present

## 2023-03-21 DIAGNOSIS — D0512 Intraductal carcinoma in situ of left breast: Secondary | ICD-10-CM | POA: Diagnosis not present

## 2023-03-21 DIAGNOSIS — N179 Acute kidney failure, unspecified: Secondary | ICD-10-CM | POA: Diagnosis not present

## 2023-03-21 DIAGNOSIS — I5033 Acute on chronic diastolic (congestive) heart failure: Secondary | ICD-10-CM | POA: Diagnosis not present

## 2023-03-21 LAB — GLUCOSE, CAPILLARY
Glucose-Capillary: 139 mg/dL — ABNORMAL HIGH (ref 70–99)
Glucose-Capillary: 178 mg/dL — ABNORMAL HIGH (ref 70–99)
Glucose-Capillary: 210 mg/dL — ABNORMAL HIGH (ref 70–99)
Glucose-Capillary: 160 mg/dL — ABNORMAL HIGH (ref 70–99)

## 2023-03-21 LAB — ECHOCARDIOGRAM COMPLETE
AR max vel: 1.94 cm2
AV Area VTI: 1.99 cm2
AV Area mean vel: 2 cm2
AV Mean grad: 2 mmHg
AV Peak grad: 4.7 mmHg
Ao pk vel: 1.08 m/s
Area-P 1/2: 4.32 cm2
Height: 63 in
S' Lateral: 2.4 cm
Weight: 2472.68 oz

## 2023-03-21 LAB — BASIC METABOLIC PANEL
Anion gap: 3 — ABNORMAL LOW (ref 5–15)
BUN: 19 mg/dL (ref 8–23)
CO2: 24 mmol/L (ref 22–32)
Calcium: 8.6 mg/dL — ABNORMAL LOW (ref 8.9–10.3)
Chloride: 113 mmol/L — ABNORMAL HIGH (ref 98–111)
Creatinine, Ser: 0.7 mg/dL (ref 0.44–1.00)
GFR, Estimated: >60 mL/min (ref >60)
Glucose, Bld: 143 mg/dL — ABNORMAL HIGH (ref 70–99)
Potassium: 3.8 mmol/L (ref 3.5–5.1)
Sodium: 140 mmol/L (ref 135–145)

## 2023-03-21 LAB — CBC
HCT: 30.4 % — ABNORMAL LOW (ref 36.0–46.0)
Hemoglobin: 10.4 g/dL — ABNORMAL LOW (ref 12.0–15.0)
MCH: 30 pg (ref 26.0–34.0)
MCHC: 34.2 g/dL (ref 30.0–36.0)
MCV: 87.6 fL (ref 80.0–100.0)
Platelets: 83 10*3/uL — ABNORMAL LOW (ref 150–400)
RBC: 3.47 MIL/uL — ABNORMAL LOW (ref 3.87–5.11)
RDW: 18.1 % — ABNORMAL HIGH (ref 11.5–15.5)
WBC: 4 10*3/uL (ref 4.0–10.5)
nRBC: 0 % (ref 0.0–0.2)

## 2023-03-21 MED ORDER — CHLORHEXIDINE GLUCONATE CLOTH 2 % EX PADS
6.0000 | MEDICATED_PAD | Freq: Every day | CUTANEOUS | Status: AC
  Administered 2023-03-21: 6 via TOPICAL

## 2023-03-21 MED ORDER — CHLORHEXIDINE GLUCONATE CLOTH 2 % EX PADS
6.0000 | MEDICATED_PAD | Freq: Every day | CUTANEOUS | Status: DC
  Administered 2023-03-22 – 2023-03-25 (×4): 6 via TOPICAL

## 2023-03-21 MED ORDER — SPIRONOLACTONE 12.5 MG HALF TABLET
12.5000 mg | ORAL_TABLET | Freq: Every day | ORAL | Status: DC
  Administered 2023-03-21 – 2023-03-25 (×5): 12.5 mg via ORAL
  Filled 2023-03-21 (×5): qty 1

## 2023-03-21 MED ORDER — FUROSEMIDE 20 MG PO TABS
20.0000 mg | ORAL_TABLET | Freq: Every day | ORAL | Status: DC
  Administered 2023-03-21 – 2023-03-25 (×5): 20 mg via ORAL
  Filled 2023-03-21 (×5): qty 1

## 2023-03-21 NOTE — Progress Notes (Signed)
*  PRELIMINARY RESULTS* Echocardiogram 2D Echocardiogram has been performed.  Amanda Green 03/21/2023, 9:42 AM

## 2023-03-21 NOTE — Progress Notes (Signed)
   Rounding Note    Patient Name: Amanda Green Date of Encounter: 03/21/2023  [place] Cardiologist: Sanda Klein, MD   Telemetry has been personally reviewed.  Patient remains in atrial fibrillation and rate is much improved.  Continue metoprolol 50 mg twice daily.  Continue amiodarone 400 mg twice daily to complete 1 full week then 200 mg daily thereafter.  No anticoagulation as outlined previously.   For questions or updates, please contact [place] Please consult [email or web address] for contact info under        Signed, Kirk Ruths, MD  03/21/2023, 7:45 AM

## 2023-03-21 NOTE — TOC Progression Note (Signed)
Transition of Care [facility]) - Progression Note    Patient Details  Name: Amanda Green MRN: [id number] Date of Birth: [birth year]  Transition of Care [facility]) CM/SW [place], [facility] Phone Number: 03/21/2023, 10:28 AM  Clinical Narrative:     CSW awaiting to hear back from patients daughter Amanda Green on determination of Appeal. Appeal was started yesterday morning by patients daughter Amanda Green. Belinda informed CSW appeal determination would take up to 72 hours.CSW will continue to follow.    Barriers to Discharge: Ship broker  Expected Discharge Plan and Services In-house Referral: Clinical Social Work     Living arrangements for the past 2 months: Single Family Home                                       Social Determinants of Health (SDOH) Interventions SDOH Screenings   Food Insecurity: No Food Insecurity (03/10/2023)  Housing: Low Risk  (03/10/2023)  Transportation Needs: No Transportation Needs (03/10/2023)  Utilities: Not At Risk (03/10/2023)  Tobacco Use: Low Risk  (03/09/2023)    Readmission Risk Interventions     No data to display

## 2023-03-21 NOTE — Progress Notes (Signed)
Physical Therapy Treatment Patient Details Name: Amanda Green MRN: [id number] DOB: [birth year] Today's Date: 03/21/2023   History of Present Illness 88 y.o. 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    PT Comments    Pt with fair tolerance to treatment today. Pt was able to sit EOB and perform seated therex however continues to be limited by fatigue and lethargy. Pt also continues to require Max A for bed mobility. No change in DC/DME recs at this time. PT will continue to follow.   Recommendations for follow up therapy are one component of a multi-disciplinary discharge planning process, led by the attending physician.  Recommendations may be updated based on patient status, additional functional criteria and insurance authorization.  Follow Up Recommendations  Can patient physically be transported by private vehicle: No    Assistance Recommended at Discharge Frequent or constant Supervision/Assistance  Patient can return home with the following Assistance with cooking/housework;Help with stairs or ramp for entrance;Assist for transportation;Two people to help with walking and/or transfers;A lot of help with bathing/dressing/bathroom   Equipment Recommendations  None recommended by PT    Recommendations for Other Services       Precautions / Restrictions Precautions Precautions: Fall Precaution Comments: Watch HR Restrictions Weight Bearing Restrictions: No     Mobility  Bed Mobility Overal bed mobility: Needs Assistance Bed Mobility: Supine to Sit, Sit to Supine     Supine to sit: Max assist Sit to supine: Max assist        Transfers                         Ambulation/Gait                   Stairs             Wheelchair Mobility    Modified Rankin (Stroke Patients Only)       Balance Overall balance assessment: Needs assistance Sitting-balance support: Bilateral upper extremity supported, Feet supported Sitting balance-Leahy Scale: Poor Sitting balance - Comments: Pt able to sit EOB for ~5 minutes however demonstrated poor postural control. Postural control: Posterior lean                                  Cognition Arousal/Alertness: Awake/alert, Lethargic Behavior During Therapy: Flat affect Overall Cognitive Status: History of cognitive impairments - at baseline                                 General Comments: overall WFL, flat affect, slowed processing, anticipate baseline        Exercises General Exercises - Lower Extremity Ankle Circles/Pumps: AROM, Both, 20 reps, Seated Long Arc Quad: AROM, Both, 5 reps, Seated Hip Flexion/Marching: AROM, Both, 5 reps, Seated    General Comments General comments (skin integrity, edema, etc.): VSS on RA. Multiple family members present and supportive.      Pertinent Vitals/Pain Pain Assessment Pain Assessment: No/denies pain    Home Living  Prior Function            PT Goals (current goals can now be found in the care plan section) Progress towards PT goals: Not progressing toward goals - comment (Pt continues to be limited by fatigue and lethargy)    Frequency    Min 1X/week      PT Plan Current plan remains appropriate    Co-evaluation              AM-PAC PT "6 Clicks" Mobility   Outcome Measure  Help needed turning from your back to your side while in a flat bed without using bedrails?: Total Help needed moving from lying on your back to sitting on the side of a flat bed without using bedrails?: Total Help needed moving to and from a bed to a chair (including a  wheelchair)?: Total Help needed standing up from a chair using your arms (e.g., wheelchair or bedside chair)?: Total Help needed to walk in hospital room?: Total Help needed climbing 3-5 steps with a railing? : Total 6 Click Score: 6    End of Session Equipment Utilized During Treatment: Gait belt Activity Tolerance: Patient limited by lethargy;Patient limited by fatigue Patient left: in bed;with call bell/phone within reach;with family/visitor present;with bed alarm set Nurse Communication: Mobility status PT Visit Diagnosis: Unsteadiness on feet (R26.81);Other abnormalities of gait and mobility (R26.89);Muscle weakness (generalized) (M62.81);History of falling (Z91.81)     Time: [id number] PT Time Calculation (min) (ACUTE ONLY): 14 min  Charges:  $Therapeutic Exercise: 8-22 mins                     Shelby Mattocks, PT, DPT Acute Rehab Services [id number]    Viann Shove 03/21/2023, 4:42 PM

## 2023-03-21 NOTE — Progress Notes (Signed)
Progress Note   Patient: Amanda Green [id number] DOB: Mar 12, 1935 DOA: 03/09/2023     12 DOS: the patient was seen and examined on 03/21/2023   Brief hospital course: Amanda Green was admitted to the hospital with the working diagnosis of atrial fibrillation with RVR complicated with heart failure exacerbation.   88 yo female with the past medical history of breast cancer complicated with brain metastasis, T2DM, hypertension, dyslipidemia, cardiac amyloid and history of CVA who presented with dyspnea and lower extremity edema. Progressive edema at home, not able to move due to edema and dyspnea on exertion. On her initial physical examination her blood pressure was 121/83, HR 109, RR 18 and 02 saturation 93%, lungs with no wheezing or rales, heart with S1 and S2 present irregularly irregular with no gallops, rubs or murmurs, abdomen with no distention, and positive lower extremity edema.   Patient was treated with diuretics and AV blockade. Progressive decline in her health. Palliative care was consulted.  Positive urinary retention requiring foley catheter.  Oncology consulted for thrombocytopenia.  Insurance declined SNF but family has appealed.   Rate control atrial fibrillation with amiodarone, no anticoagulation due to bleeding risk.   Patient medically stable. Echocardiogram with preserved LV systolic function.   Assessment and Plan: * Atrial fibrillation with RVR Patient continue atrial fibrillation with rate 90 to 100.  Plan to continue amiodarone for rate control Risk vs benefit, anticoagulation on hold due to brain mets and thrombocytopenia.  Continue telemetry monitoring.   Acute on chronic diastolic CHF (congestive heart failure) Echocardiogram with preserved LV systolic function 60 to [id number], mild LVH, RV systolic function preserved, LA with mild dilatation, RVSP 58.1 mmHg, no significant valvular disease.   Improved volume status.   Continue with metoprolol,  furosemide and will add spironolactone.  Tafamidis for amyloidosis.   AKI (acute kidney injury) Hyponatremia.   Renal function stable with serum cr at 0,70 with K at 3,9 and serum bicarbonate at 24. Na 140 and Mg 1,8  Acute urinary retention, continue with foley catheter.   Continue diuresis with furosemide and spironolactone.   Elevated troponin She has no chest pain or acute changes on EKG besides afib with RVR Likely demand ischemia in setting of afib with RVR Down trending Continue tele   Ductal carcinoma in situ (DCIS) of left breast Brain metastasis. Continue with dexamethasone.   Thrombocytopenia, with stable plt.   Cardiac amyloidosis Continue tafamidis 61 mg p.o. daily    Controlled type 2 diabetes mellitus without complication, without long-term current use of insulin Continue insulin sliding scale for glucose cover and monitoring.   Essential hypertension Blood pressure stable, continue with metoprolol, if blood pressure stable will add ARB.    Cognitive impairment Continue namenda Bid  Delerium precautions   Pressure injury of skin Continue wound care.   Leukocytosis Secondary to steroids No s/sx of infectious etiology         Subjective: Patient with no chest pain or dyspnea, she continue very weak and deconditioned.  Physical Exam: Vitals:   03/21/23 0529 03/21/23 0800 03/21/23 1217 03/21/23 1624  BP: (!) 135/98 131/80 121/70 121/72  Pulse:  (!) 112 65 63  Resp: (!) 21 (!) 30 (!) 23 (!) 21  Temp: 98 F (36.7 C) 98.5 F (36.9 C) 98 F (36.7 C) 98.3 F (36.8 C)  TempSrc: Oral Oral Oral Oral  SpO2: 98% 97% 97% 97%  Weight:      Height:       Neurology awake  and alert ENT with mild pallor Cardiovascular with S1 and S2 present irregularly irregular with no gallops, rubs or murmurs Respiratory with decreased breath sounds at bases with no wheezing or rales Abdomen with no distention  No lower extremity edema  Data  Reviewed:    Family Communication: I spoke with patient's daughters at the bedside, we talked in detail about patient's condition, plan of care and prognosis and all questions were addressed.   Disposition: Status is: Inpatient Remains inpatient appropriate because: pending placement SNF  Planned Discharge Destination: Skilled nursing facility     Author: Tawni Millers, MD 03/21/2023 5:24 PM  For on call review [email or web address].

## 2023-03-22 DIAGNOSIS — N179 Acute kidney failure, unspecified: Secondary | ICD-10-CM | POA: Diagnosis not present

## 2023-03-22 DIAGNOSIS — I5033 Acute on chronic diastolic (congestive) heart failure: Secondary | ICD-10-CM | POA: Diagnosis not present

## 2023-03-22 DIAGNOSIS — D0512 Intraductal carcinoma in situ of left breast: Secondary | ICD-10-CM | POA: Diagnosis not present

## 2023-03-22 DIAGNOSIS — I4891 Unspecified atrial fibrillation: Secondary | ICD-10-CM | POA: Diagnosis not present

## 2023-03-22 LAB — BASIC METABOLIC PANEL
Anion gap: 6 (ref 5–15)
BUN: 16 mg/dL (ref 8–23)
CO2: 25 mmol/L (ref 22–32)
Calcium: 8.7 mg/dL — ABNORMAL LOW (ref 8.9–10.3)
Chloride: 111 mmol/L (ref 98–111)
Creatinine, Ser: 0.63 mg/dL (ref 0.44–1.00)
GFR, Estimated: >60 mL/min (ref >60)
Glucose, Bld: 133 mg/dL — ABNORMAL HIGH (ref 70–99)
Potassium: 3.6 mmol/L (ref 3.5–5.1)
Sodium: 142 mmol/L (ref 135–145)

## 2023-03-22 LAB — GLUCOSE, CAPILLARY
Glucose-Capillary: 102 mg/dL — ABNORMAL HIGH (ref 70–99)
Glucose-Capillary: 185 mg/dL — ABNORMAL HIGH (ref 70–99)
Glucose-Capillary: 210 mg/dL — ABNORMAL HIGH (ref 70–99)
Glucose-Capillary: 138 mg/dL — ABNORMAL HIGH (ref 70–99)

## 2023-03-22 MED ORDER — LOSARTAN POTASSIUM 25 MG PO TABS
25.0000 mg | ORAL_TABLET | Freq: Every day | ORAL | Status: DC
  Administered 2023-03-22 – 2023-03-25 (×4): 25 mg via ORAL
  Filled 2023-03-22 (×4): qty 1

## 2023-03-22 MED ORDER — DEXAMETHASONE 0.5 MG PO TABS
0.5000 mg | ORAL_TABLET | ORAL | Status: DC
  Administered 2023-03-22 – 2023-03-24 (×2): 0.5 mg via ORAL
  Filled 2023-03-22 (×2): qty 1

## 2023-03-22 MED ORDER — AMIODARONE HCL 200 MG PO TABS
200.0000 mg | ORAL_TABLET | Freq: Every day | ORAL | Status: DC
  Administered 2023-03-22 – 2023-03-25 (×4): 200 mg via ORAL
  Filled 2023-03-22 (×4): qty 1

## 2023-03-22 NOTE — Progress Notes (Signed)
Progress Note   Patient: Amanda Green [id number] DOB: Feb 15, 1935 DOA: 03/09/2023     13 DOS: the patient was seen and examined on 03/22/2023   Brief hospital course: Amanda Green was admitted to the hospital with the working diagnosis of atrial fibrillation with RVR complicated with heart failure exacerbation.   88 yo female with the past medical history of breast cancer complicated with brain metastasis, T2DM, hypertension, dyslipidemia, cardiac amyloid and history of CVA who presented with dyspnea and lower extremity edema. Progressive edema at home, not able to move due to edema and dyspnea on exertion. On her initial physical examination her blood pressure was 121/83, HR 109, RR 18 and 02 saturation 93%, lungs with no wheezing or rales, heart with S1 and S2 present irregularly irregular with no gallops, rubs or murmurs, abdomen with no distention, and positive lower extremity edema.   Patient was treated with diuretics and AV blockade. Progressive decline in her health. Palliative care was consulted.  Positive urinary retention requiring foley catheter.  Oncology consulted for thrombocytopenia.  Insurance declined SNF but family has appealed.   Rate control atrial fibrillation with amiodarone, no anticoagulation due to bleeding risk.   Patient medically stable. Echocardiogram with preserved LV systolic function.   Assessment and Plan: * Atrial fibrillation with RVR Plan to continue amiodarone for rate control Risk vs benefit, anticoagulation on hold due to brain mets and thrombocytopenia.  Ok to discontinue telemetry.   Acute on chronic diastolic CHF (congestive heart failure) Echocardiogram with preserved LV systolic function 60 to 65%, mild LVH, RV systolic function preserved, LA with mild dilatation, RVSP 58.1 mmHg, no significant valvular disease.   Improved volume status.   Continue with metoprolol, furosemide and spironolactone.  Add low dose losartan.  Tafamidis for  amyloidosis.   AKI (acute kidney injury) Hyponatremia.   Renal function continue to be stable with serum cr at 0,63, K is 3,6 and serum bicarbonate at 25. Na 142.  Acute urinary retention, continue with foley catheter.   Continue diuresis with furosemide and spironolactone.   Elevated troponin She has no chest pain or acute changes on EKG besides afib with RVR Likely demand ischemia in setting of afib with RVR Down trending Continue tele   Ductal carcinoma in situ (DCIS) of left breast Brain metastasis. Continue with dexamethasone, taper per outpatient recommendations.   Thrombocytopenia, with stable plt.   Cardiac amyloidosis Continue tafamidis 61 mg p.o. daily    Controlled type 2 diabetes mellitus without complication, without long-term current use of insulin Continue insulin sliding scale for glucose cover and monitoring.   Essential hypertension Blood pressure stable, continue with metoprolol, and low dose losartan.   Cognitive impairment Continue namenda Bid  Delerium precautions   Pressure injury of skin Continue wound care.   Leukocytosis Secondary to steroids No s/sx of infectious etiology         Subjective: Patient with no chest pain or dyspnea, continue very weak and deconditioned, poor oral intake,   Physical Exam: Vitals:   03/22/23 0407 03/22/23 0733 03/22/23 0931 03/22/23 1134  BP: 126/75 (!) 108/55 (!) 108/57 (!) 114/57  Pulse: (!) 57 67 70 62  Resp: (!) 22 (!) 21  (!) 23  Temp: 97.9 F (36.6 C) 97.8 F (36.6 C)  98.2 F (36.8 C)  TempSrc: Oral Oral  Oral  SpO2: 96% 94%  98%  Weight: 72 kg     Height:       Neurology awake and alert ENT with mild  pallor Cardiovascular with S1 and S2 present irregularly irregular with no gallops, rubs or murmurs Respiratory with no rales or wheezing on anterior auscultation  Abdomen with no distention  No lower extremity edema  Data Reviewed:    Family Communication: I spoke with patient's  daughter at the bedside, we talked in detail about patient's condition, plan of care and prognosis and all questions were addressed.   Disposition: Status is: Inpatient Remains inpatient appropriate because: pending appeal from SNF  Planned Discharge Destination: Skilled nursing facility    Author: Coralie Keens, MD 03/22/2023 12:56 PM  For on call review [email or web address].

## 2023-03-22 NOTE — Progress Notes (Signed)
Rounding Note    Patient Name: Amanda Green Date of Encounter: 03/22/2023  [facility] Cardiologist: Thurmon Fair, MD   Subjective   Pt denies CP or dyspnea  Inpatient Medications    Scheduled Meds:  amiodarone  400 mg Oral BID   brimonidine  1 drop Left Eye BID   And   timolol  1 drop Left Eye BID   Chlorhexidine Gluconate Cloth  6 each Topical Q0600   dexamethasone (DECADRON) injection  2 mg Intravenous Q24H   dorzolamide  1 drop Left Eye BID   furosemide  20 mg Oral Daily   Gerhardt's butt cream   Topical BID   insulin aspart  0-9 Units Subcutaneous TID WC   metoprolol tartrate  50 mg Oral BID   ofloxacin  1 drop Right Eye QID   prednisoLONE acetate  1 drop Right Eye QID   spironolactone  12.5 mg Oral Daily   Tafamidis  61 mg Oral Daily   Continuous Infusions:  sodium chloride 5 mL/hr at 03/14/23 1753   PRN Meds: sodium chloride, acetaminophen **OR** acetaminophen, mouth rinse   Vital Signs    Vitals:   03/21/23 2210 03/21/23 2349 03/22/23 0407 03/22/23 0733  BP: (!) 118/53 131/64 126/75 (!) 108/55  Pulse: 67 60 (!) 57 67  Resp:  17 (!) 22 (!) 21  Temp:  97.9 F (36.6 C) 97.9 F (36.6 C) 97.8 F (36.6 C)  TempSrc:  Oral Oral Oral  SpO2:  97% 96% 94%  Weight:   72 kg   Height:        Intake/Output Summary (Last 24 hours) at 03/22/2023 0818 Last data filed at 03/22/2023 5883 Gross per 24 hour  Intake 270 ml  Output 1280 ml  Net -1010 ml       03/22/2023    4:07 AM 03/20/2023    4:44 AM 03/19/2023    4:59 AM  Last 3 Weights  Weight (lbs) 158 lb 11.7 oz 154 lb 8.7 oz 158 lb 6.4 oz  Weight (kg) 72 kg 70.1 kg 71.85 kg      Telemetry    Atrial fibrillation converting to sinus rhythm with PACs and transient AIVR- Personally Reviewed   Physical Exam   GEN: NAD, chronically ill appearing Neck: Supple, no JVD Cardiac: RRR Respiratory: CTA; no wheeze GI: Soft, NT/ND, no masses MS: No edema Neuro:  No focal findings Psych: Normal  affect   Labs    High Sensitivity Troponin:   Recent Labs  Lab 03/09/23 1204 03/09/23 1402  TROPONINIHS 72* 51*      Chemistry Recent Labs  Lab 03/17/23 0154 03/18/23 0749 03/19/23 0643 03/21/23 0219 03/22/23 0157  NA 143 144 144 140 142  K 3.5 4.2 4.0 3.8 3.6  CL 112* 114* 114* 113* 111  CO2 24 25 26 24 25   GLUCOSE 94 109* 139* 143* 133*  BUN 18 16 16 19 16   CREATININE 0.67 0.63 0.57 0.70 0.63  CALCIUM 8.9 8.6* 8.6* 8.6* 8.7*  MG 1.7 1.9 1.8  --   --   ALBUMIN 1.9* 1.8* 1.8*  --   --   GFRNONAA >60 >60 >60 >60 >60  ANIONGAP 7 5 4* 3* 6     Hematology Recent Labs  Lab 03/18/23 0749 03/19/23 0643 03/21/23 0219  WBC 4.3 3.9* 4.0  RBC 3.66* 3.48* 3.47*  HGB 10.8* 10.2* 10.4*  HCT 32.2* 30.4* 30.4*  MCV 88.0 87.4 87.6  MCH 29.5 29.3 30.0  MCHC  33.5 33.6 34.2  RDW 18.3* 18.1* 18.1*  PLT 73* 74* 83*     DDimer  Recent Labs  Lab 03/16/23 1750  DDIMER 11.36*      Patient Profile     88 y.o. female with a history of cardiac amyloidosis on Tafamis, paroxysmal atrial fibrillation on Eliquis, hypertension, hyperlipidemia, type 2 diabetes mellitus, prior CVA, memory loss, and breast cancer s/p right mastectomy in 1999 and left mastectomy in 2022 now with likely metastasis to the brain based on brain MRI in 01/2023 who was admitted on 03/09/2023 for atrial fibrillation with RVR after presenting with increased shortness of breath and swelling.  Last echocardiogram April 2022 showed hyperdynamic LV function, severe left ventricular hypertrophy and pattern concerning for amyloidosis, grade 1 diastolic dysfunction, mild left atrial enlargement.  Assessment & Plan   1 atrial fibrillation with rapid ventricular response-patient has converted to sinus rhythm.  Continue metoprolol at present dose.  Decrease amiodarone to 200 mg daily.  No anticoagulation given history of metastatic disease to her brain and risk of hemorrhage.    2 cardiac amyloid-continue tafamidis.  3  acute on chronic diastolic congestive heart failure-patient remains euvolemic on examination.  Can add Lasix as needed following discharge.  Cardiology will sign off.  Please call with questions.  For questions or updates, please contact [facility] Please consult [email or web address] for contact info under        Signed, Olga Millers, MD  03/22/2023, 8:18 AM

## 2023-03-22 NOTE — Progress Notes (Signed)
     Referral previously received for Amanda Green for goals of care discussion. See last visit note dated 03/18/23. Goals were clear at that time for full code, full scope. Planned d/c to SNF rehab.   Chart reviewed today and noted SNF Auth denied by insurance, family is appealing and waiting results from appeal. Goals appear to remain full code, full scope. At this point she is facing a disposition issue. No identified new palliative needs.  We will sign off for now, will remove consult order. Please contact us and enter a new consult order if we can be of further assistance.  Thank you for your referral and allowing PMT to assist in [facility]'s care.   Wynne Dust, NP Palliative Medicine Team Phone: [phone]  NO CHARGE

## 2023-03-22 NOTE — Care Management Important Message (Signed)
Important Message  Patient Details  Name: Amanda Green MRN: [id number] Date of Birth: 10/05/1935   Medicare Important Message Given:  Yes     Sherilyn Banker 03/22/2023, 9:11 AM

## 2023-03-22 NOTE — Plan of Care (Signed)
  Problem: Education: Goal: Knowledge of disease or condition will improve Outcome: Progressing Goal: Understanding of medication regimen will improve Outcome: Progressing Goal: Individualized Educational Video(s) Outcome: Not Applicable   Problem: Activity: Goal: Ability to tolerate increased activity will improve Outcome: Not Applicable   Problem: Cardiac: Goal: Ability to achieve and maintain adequate cardiopulmonary perfusion will improve Outcome: Adequate for Discharge   Problem: Health Behavior/Discharge Planning: Goal: Ability to safely manage health-related needs after discharge will improve Outcome: Adequate for Discharge

## 2023-03-22 NOTE — TOC Progression Note (Signed)
Transition of Care [facility]) - Progression Note    Patient Details  Name: Amanda Green MRN: [id number] Date of Birth: 1935/07/03  Transition [facility]) CM/SW Contact  Delilah Shan, LCSWA Phone Number: 03/22/2023, 5:23 PM  Clinical Narrative:     CSW spoke with [place] patients daughter who informed CSW she has not heard back from appeal yet. All questions answered. No further questions reported at this time.Plan for CSW tomorrow to follow up on status of appeal with [place]inda. CSW will continue to follow and assist with patients dc planning needs.    Barriers to Discharge: English as a second language teacher  Expected Discharge Plan and Services In-house Referral: Clinical Social Work     Living arrangements for the past 2 months: Single Family Home                                       Social Determinants of Health (SDOH) Interventions SDOH Screenings   Food Insecurity: No Food Insecurity (03/10/2023)  Housing: Low Risk  (03/10/2023)  Transportation Needs: No Transportation Needs (03/10/2023)  Utilities: Not At Risk (03/10/2023)  Tobacco Use: Low Risk  (03/09/2023)    Readmission Risk Interventions     No data to display

## 2023-03-23 DIAGNOSIS — I4891 Unspecified atrial fibrillation: Secondary | ICD-10-CM | POA: Diagnosis not present

## 2023-03-23 DIAGNOSIS — D0512 Intraductal carcinoma in situ of left breast: Secondary | ICD-10-CM | POA: Diagnosis not present

## 2023-03-23 DIAGNOSIS — N179 Acute kidney failure, unspecified: Secondary | ICD-10-CM | POA: Diagnosis not present

## 2023-03-23 DIAGNOSIS — I5033 Acute on chronic diastolic (congestive) heart failure: Secondary | ICD-10-CM | POA: Diagnosis not present

## 2023-03-23 LAB — GLUCOSE, CAPILLARY
Glucose-Capillary: 101 mg/dL — ABNORMAL HIGH (ref 70–99)
Glucose-Capillary: 162 mg/dL — ABNORMAL HIGH (ref 70–99)
Glucose-Capillary: 113 mg/dL — ABNORMAL HIGH (ref 70–99)
Glucose-Capillary: 127 mg/dL — ABNORMAL HIGH (ref 70–99)

## 2023-03-23 NOTE — Progress Notes (Signed)
Progress Note   Patient: Amanda Green [id number]:[id number] DOB: [birth year] DOA: 03/09/2023     14 DOS: the patient was seen and examined on 03/23/2023   Brief hospital course: Mrs. Marts was admitted to the hospital with the working diagnosis of atrial fibrillation with RVR complicated with heart failure exacerbation.   88 yo female with the past medical history of breast cancer complicated with brain metastasis, T2DM, hypertension, dyslipidemia, cardiac amyloid and history of CVA who presented with dyspnea and lower extremity edema. Progressive edema at home, not able to move due to edema and dyspnea on exertion. On her initial physical examination her blood pressure was 121/83, HR 109, RR 18 and 02 saturation 93%, lungs with no wheezing or rales, heart with S1 and S2 present irregularly irregular with no gallops, rubs or murmurs, abdomen with no distention, and positive lower extremity edema.   Patient was treated with diuretics and AV blockade. Progressive decline in her health. Palliative care was consulted.  Positive urinary retention requiring foley catheter.  Oncology consulted for thrombocytopenia.  Insurance declined SNF but family has appealed.   Rate control atrial fibrillation with amiodarone, no anticoagulation due to bleeding risk.   Patient medically stable. Echocardiogram with preserved LV systolic function.   04/06 patient medically stable, pending appeal for SNF. If denied plan to arrange home health services.   Assessment and Plan: * Atrial fibrillation with RVR Plan to continue amiodarone for rate control Risk vs benefit, anticoagulation on hold due to brain mets and thrombocytopenia.  Ok to discontinue telemetry.   Acute on chronic diastolic CHF (congestive heart failure) Echocardiogram with preserved LV systolic function 60 to 65%, mild LVH, RV systolic function preserved, LA with mild dilatation, RVSP 58.1 mmHg, no significant valvular disease.   Improved volume  status.  Systolic blood pressure 120 to 130 mmHg.   Continue with metoprolol, losartan, furosemide and spironolactone.   No SGLT 2 inh due to urinary retention and risk for urinary tract infection.  Tafamidis for TTR Amyloidosis.   AKI (acute kidney injury) Hyponatremia.   Renal function has been stable, follow up in 48 hrs.  Acute urinary retention, continue with foley catheter.   Continue diuresis with furosemide and spironolactone.   Elevated troponin She has no chest pain or acute changes on EKG besides afib with RVR Likely demand ischemia in setting of afib with RVR Down trending Continue tele   Ductal carcinoma in situ (DCIS) of left breast Brain metastasis. Continue with dexamethasone, taper per outpatient recommendations.   Thrombocytopenia, with stable plt.   Cardiac amyloidosis Continue tafamidis 61 mg p.o. daily    Controlled type 2 diabetes mellitus without complication, without long-term current use of insulin Continue insulin sliding scale for glucose cover and monitoring.   Essential hypertension Blood pressure stable, continue with spironolactone, metoprolol, and losartan.   Cognitive impairment Continue namenda Bid  Delerium precautions   Pressure injury of skin Continue wound care.   Leukocytosis Secondary to steroids No s/sx of infectious etiology         Subjective: patient with no chest pain or dyspnea, continue to be very weak and deconditioned   Physical Exam: Vitals:   03/22/23 2132 03/22/23 2349 03/23/23 0459 03/23/23 0735  BP: 131/69 127/67 129/65 134/64  Pulse: 65 (!) 57 64 68  Resp:  (!) 24 (!) 22 (!) 28  Temp:  98.4 F (36.9 C) 98 F (36.7 C) 98 F (36.7 C)  TempSrc:  Oral Oral Oral  SpO2:  99% 98%  96%  Weight:   72 kg   Height:       Neurology awake and alert ENT with mild pallor Cardiovascular with S1 and S2 present and rhythmic with no gallops, rubs or murmurs No JVD No lower extremity edema Respiratory with  no rales or wheezing Abdomen with no distention  Data Reviewed:    Family Communication: I spoke with patient's nice at the bedside, we talked in detail about patient's condition, plan of care and prognosis and all questions were addressed.   Disposition: Status is: Inpatient Remains inpatient appropriate because: pending placement SnF (family appeal denial).   Planned Discharge Destination: Skilled nursing facility      Author: Coralie Keens, MD 03/23/2023 11:07 AM  For on call review [email or web address].

## 2023-03-23 NOTE — Progress Notes (Addendum)
Patient's insurance authorization has been approved. Next review date is 03/27/23.  CSW updated patient's daughter Massie Bougie with information.  CSW spoke with Kia at [facility] who states the facility does not have a female bed today.  Edwin Dada, MSW, LCSW Transitions of Care  Clinical Social Worker II [phone]

## 2023-03-23 NOTE — Plan of Care (Signed)
  Problem: Education: Goal: Knowledge of General Education information will improve Description: Including pain rating scale, medication(s)/side effects and non-pharmacologic comfort measures Outcome: Progressing   Problem: Health Behavior/Discharge Planning: Goal: Ability to manage health-related needs will improve Outcome: Progressing   Problem: Clinical Measurements: Goal: Cardiovascular complication will be avoided Outcome: Progressing   Problem: Activity: Goal: Risk for activity intolerance will decrease Outcome: Progressing   Problem: Nutrition: Goal: Adequate nutrition will be maintained Outcome: Progressing   Problem: Elimination: Goal: Will not experience complications related to urinary retention Outcome: Progressing   Problem: Pain Managment: Goal: General experience of comfort will improve Outcome: Progressing   Problem: Safety: Goal: Ability to remain free from injury will improve Outcome: Progressing   Problem: Skin Integrity: Goal: Risk for impaired skin integrity will decrease Outcome: Progressing

## 2023-03-23 NOTE — Plan of Care (Signed)
  Problem: Education: Goal: Ability to describe self-care measures that may prevent or decrease complications (Diabetes Survival Skills Education) will improve Outcome: Progressing Goal: Individualized Educational Video(s) Outcome: Progressing   Problem: Coping: Goal: Ability to adjust to condition or change in health will improve Outcome: Progressing   Problem: Fluid Volume: Goal: Ability to maintain a balanced intake and output will improve Outcome: Progressing   Problem: Health Behavior/Discharge Planning: Goal: Ability to identify and utilize available resources and services will improve Outcome: Progressing Goal: Ability to manage health-related needs will improve Outcome: Progressing   Problem: Metabolic: Goal: Ability to maintain appropriate glucose levels will improve Outcome: Progressing   Problem: Nutritional: Goal: Maintenance of adequate nutrition will improve Outcome: Progressing Goal: Progress toward achieving an optimal weight will improve Outcome: Progressing   Problem: Skin Integrity: Goal: Risk for impaired skin integrity will decrease Outcome: Progressing   Problem: Tissue Perfusion: Goal: Adequacy of tissue perfusion will improve Outcome: Progressing   Problem: Education: Goal: Knowledge of disease or condition will improve Outcome: Progressing Goal: Understanding of medication regimen will improve Outcome: Progressing   Problem: Cardiac: Goal: Ability to achieve and maintain adequate cardiopulmonary perfusion will improve Outcome: Progressing   Problem: Health Behavior/Discharge Planning: Goal: Ability to safely manage health-related needs after discharge will improve Outcome: Progressing   Problem: Education: Goal: Knowledge of General Education information will improve Description: Including pain rating scale, medication(s)/side effects and non-pharmacologic comfort measures Outcome: Progressing   Problem: Health Behavior/Discharge  Planning: Goal: Ability to manage health-related needs will improve Outcome: Progressing   Problem: Clinical Measurements: Goal: Ability to maintain clinical measurements within normal limits will improve Outcome: Progressing Goal: Will remain free from infection Outcome: Progressing Goal: Diagnostic test results will improve Outcome: Progressing Goal: Respiratory complications will improve Outcome: Progressing Goal: Cardiovascular complication will be avoided Outcome: Progressing   Problem: Activity: Goal: Risk for activity intolerance will decrease Outcome: Progressing   Problem: Nutrition: Goal: Adequate nutrition will be maintained Outcome: Progressing   Problem: Coping: Goal: Level of anxiety will decrease Outcome: Progressing   Problem: Elimination: Goal: Will not experience complications related to bowel motility Outcome: Progressing Goal: Will not experience complications related to urinary retention Outcome: Progressing   Problem: Pain Managment: Goal: General experience of comfort will improve Outcome: Progressing   Problem: Safety: Goal: Ability to remain free from injury will improve Outcome: Progressing   Problem: Skin Integrity: Goal: Risk for impaired skin integrity will decrease Outcome: Progressing   

## 2023-03-24 DIAGNOSIS — D0512 Intraductal carcinoma in situ of left breast: Secondary | ICD-10-CM | POA: Diagnosis not present

## 2023-03-24 DIAGNOSIS — I5033 Acute on chronic diastolic (congestive) heart failure: Secondary | ICD-10-CM | POA: Diagnosis not present

## 2023-03-24 DIAGNOSIS — I4891 Unspecified atrial fibrillation: Secondary | ICD-10-CM | POA: Diagnosis not present

## 2023-03-24 DIAGNOSIS — N179 Acute kidney failure, unspecified: Secondary | ICD-10-CM | POA: Diagnosis not present

## 2023-03-24 LAB — GLUCOSE, CAPILLARY
Glucose-Capillary: 100 mg/dL — ABNORMAL HIGH (ref 70–99)
Glucose-Capillary: 159 mg/dL — ABNORMAL HIGH (ref 70–99)
Glucose-Capillary: 169 mg/dL — ABNORMAL HIGH (ref 70–99)
Glucose-Capillary: 134 mg/dL — ABNORMAL HIGH (ref 70–99)

## 2023-03-24 MED ORDER — MUSCLE RUB 10-15 % EX CREA
TOPICAL_CREAM | CUTANEOUS | Status: DC | PRN
  Filled 2023-03-24 (×2): qty 85

## 2023-03-24 NOTE — Progress Notes (Signed)
Progress Note   Patient: Amanda Green [id number]:[id number] DOB: 1935/10/14 DOA: 03/09/2023     15 DOS: the patient was seen and examined on 03/24/2023   Brief hospital course: Amanda Green was admitted to the hospital with the working diagnosis of atrial fibrillation with RVR complicated with heart failure exacerbation.   88 yo female with the past medical history of breast cancer complicated with brain metastasis, T2DM, hypertension, dyslipidemia, cardiac amyloid and history of CVA who presented with dyspnea and lower extremity edema. Progressive edema at home, not able to move due to edema and dyspnea on exertion. On her initial physical examination her blood pressure was 121/83, HR 109, RR 18 and 02 saturation 93%, lungs with no wheezing or rales, heart with S1 and S2 present irregularly irregular with no gallops, rubs or murmurs, abdomen with no distention, and positive lower extremity edema.   Patient was treated with diuretics and AV blockade. Progressive decline in her health. Palliative care was consulted.  Positive urinary retention requiring foley catheter.  Oncology consulted for thrombocytopenia.  Insurance declined SNF but family has appealed.   Rate control atrial fibrillation with amiodarone, no anticoagulation due to bleeding risk.   Patient medically stable. Echocardiogram with preserved LV systolic function.   04/06 patient medically stable, pending appeal for SNF. If denied plan to arrange home health services.  04/07 patient has been approved for SNF, pending bed to be available.   Assessment and Plan: * Atrial fibrillation with RVR Plan to continue amiodarone for rate control Risk vs benefit, anticoagulation on hold due to brain mets and thrombocytopenia.  Ok to discontinue telemetry.   Acute on chronic diastolic CHF (congestive heart failure) Echocardiogram with preserved LV systolic function 60 to 65%, mild LVH, RV systolic function preserved, LA with mild  dilatation, RVSP 58.1 mmHg, no significant valvular disease.   Continue with metoprolol, losartan, furosemide and spironolactone.   No SGLT 2 inh due to urinary retention and risk for urinary tract infection.  Tafamidis for TTR Amyloidosis.   AKI (acute kidney injury) Hyponatremia.   Renal function has been stable.  Continue diuresis with furosemide and spironolactone.   Elevated troponin She has no chest pain or acute changes on EKG besides afib with RVR Likely demand ischemia in setting of afib with RVR Down trending Continue tele   Ductal carcinoma in situ (DCIS) of left breast Brain metastasis. Continue with dexamethasone, taper per outpatient recommendations.   Thrombocytopenia, with stable plt.   Cardiac amyloidosis Continue tafamidis 61 mg p.o. daily    Controlled type 2 diabetes mellitus without complication, without long-term current use of insulin Continue insulin sliding scale for glucose cover and monitoring.   Essential hypertension Blood pressure stable, continue with spironolactone, metoprolol, and losartan.   Cognitive impairment Continue namenda Bid  Delerium precautions   Pressure injury of skin Continue wound care.   Leukocytosis Secondary to steroids No s/sx of infectious etiology         Subjective: patient is feeling better, she is tolerating po well, yesterday had difficulty eating but today with no problems. Her daughter is at the bedside   Physical Exam: Vitals:   03/23/23 1950 03/23/23 2354 03/24/23 0430 03/24/23 0727  BP: 126/63 134/62 129/70 129/66  Pulse: 71 67 67 65  Resp: (!) 22 (!) 25 20 20   Temp: 97.7 F (36.5 C) 98.5 F (36.9 C) 98.5 F (36.9 C) 98.4 F (36.9 C)  TempSrc: Axillary Oral Oral Oral  SpO2: 98% 96% 96% 95%  Weight:  67.4 kg   Height:       Neurology awake and alert ENT with mild pallor Cardiovascular with S1 and S2 present, with no gallops, rubs or murmurs No JVD No lower extremity  edema Respiratory with no rales or wheezing Abdomen with no distention   Data Reviewed:    Family Communication: I spoke with patient's daughter at the bedside, we talked in detail about patient's condition, plan of care and prognosis and all questions were addressed.   Disposition: Status is: Inpatient Remains inpatient appropriate because: pending SNF  Planned Discharge Destination: Skilled nursing facility    Author: Coralie Keens, MD 03/24/2023 10:56 AM  For on call review [email or web address].

## 2023-03-24 NOTE — Plan of Care (Signed)
  Problem: Education: Goal: Knowledge of General Education information will improve Description Including pain rating scale, medication(s)/side effects and non-pharmacologic comfort measures Outcome: Progressing   Problem: Health Behavior/Discharge Planning: Goal: Ability to manage health-related needs will improve Outcome: Progressing   Problem: Clinical Measurements: Goal: Cardiovascular complication will be avoided Outcome: Progressing   Problem: Activity: Goal: Risk for activity intolerance will decrease Outcome: Progressing   Problem: Nutrition: Goal: Adequate nutrition will be maintained Outcome: Progressing   Problem: Coping: Goal: Level of anxiety will decrease Outcome: Progressing   Problem: Pain Managment: Goal: General experience of comfort will improve Outcome: Progressing   Problem: Safety: Goal: Ability to remain free from injury will improve Outcome: Progressing   Problem: Skin Integrity: Goal: Risk for impaired skin integrity will decrease Outcome: Progressing   

## 2023-03-24 NOTE — TOC Progression Note (Signed)
Transition of Care [facility]) - Progression Note    Patient Details  Name: Amanda Green MRN: [id number] Date of Birth: 06-08-1935  Transition of Care [facility]) CM/SW Contact  Delilah Shan, LCSWA Phone Number: 03/24/2023, 9:33 AM  Clinical Narrative:      CSW checked with [facility] to see if they have bed for patient today. [facility] informed CSW they do not have SNF bed for patient today to check back tomorrow. CSW will continue to follow and assist with patients dc planning needs.    Barriers to Discharge: English as a second language teacher  Expected Discharge Plan and Services In-house Referral: Clinical Social Work     Living arrangements for the past 2 months: Single Family Home                                       Social Determinants of Health (SDOH) Interventions SDOH Screenings   Food Insecurity: No Food Insecurity (03/10/2023)  Housing: Low Risk  (03/10/2023)  Transportation Needs: No Transportation Needs (03/10/2023)  Utilities: Not At Risk (03/10/2023)  Tobacco Use: Low Risk  (03/09/2023)    Readmission Risk Interventions     No data to display

## 2023-03-24 NOTE — Plan of Care (Signed)
  Problem: Education: Goal: Ability to describe self-care measures that may prevent or decrease complications (Diabetes Survival Skills Education) will improve Outcome: Progressing Goal: Individualized Educational Video(s) Outcome: Progressing   Problem: Coping: Goal: Ability to adjust to condition or change in health will improve Outcome: Progressing   Problem: Fluid Volume: Goal: Ability to maintain a balanced intake and output will improve Outcome: Progressing   Problem: Health Behavior/Discharge Planning: Goal: Ability to identify and utilize available resources and services will improve Outcome: Progressing Goal: Ability to manage health-related needs will improve Outcome: Progressing   Problem: Metabolic: Goal: Ability to maintain appropriate glucose levels will improve Outcome: Progressing   Problem: Nutritional: Goal: Maintenance of adequate nutrition will improve Outcome: Progressing Goal: Progress toward achieving an optimal weight will improve Outcome: Progressing   Problem: Skin Integrity: Goal: Risk for impaired skin integrity will decrease Outcome: Progressing   Problem: Tissue Perfusion: Goal: Adequacy of tissue perfusion will improve Outcome: Progressing   Problem: Education: Goal: Knowledge of disease or condition will improve Outcome: Progressing Goal: Understanding of medication regimen will improve Outcome: Progressing   Problem: Cardiac: Goal: Ability to achieve and maintain adequate cardiopulmonary perfusion will improve Outcome: Progressing   Problem: Health Behavior/Discharge Planning: Goal: Ability to safely manage health-related needs after discharge will improve Outcome: Progressing   Problem: Education: Goal: Knowledge of General Education information will improve Description: Including pain rating scale, medication(s)/side effects and non-pharmacologic comfort measures Outcome: Progressing   Problem: Health Behavior/Discharge  Planning: Goal: Ability to manage health-related needs will improve Outcome: Progressing   Problem: Clinical Measurements: Goal: Ability to maintain clinical measurements within normal limits will improve Outcome: Progressing Goal: Will remain free from infection Outcome: Progressing Goal: Diagnostic test results will improve Outcome: Progressing Goal: Respiratory complications will improve Outcome: Progressing Goal: Cardiovascular complication will be avoided Outcome: Progressing   Problem: Activity: Goal: Risk for activity intolerance will decrease Outcome: Progressing   Problem: Nutrition: Goal: Adequate nutrition will be maintained Outcome: Progressing   Problem: Coping: Goal: Level of anxiety will decrease Outcome: Progressing   Problem: Elimination: Goal: Will not experience complications related to bowel motility Outcome: Progressing Goal: Will not experience complications related to urinary retention Outcome: Progressing   Problem: Pain Managment: Goal: General experience of comfort will improve Outcome: Progressing   Problem: Safety: Goal: Ability to remain free from injury will improve Outcome: Progressing   Problem: Skin Integrity: Goal: Risk for impaired skin integrity will decrease Outcome: Progressing

## 2023-03-25 ENCOUNTER — Inpatient Hospital Stay: Payer: Medicare PPO | Admitting: Hematology and Oncology

## 2023-03-25 ENCOUNTER — Inpatient Hospital Stay: Payer: Medicare PPO | Attending: Hematology and Oncology | Admitting: Hematology and Oncology

## 2023-03-25 DIAGNOSIS — C7931 Secondary malignant neoplasm of brain: Secondary | ICD-10-CM

## 2023-03-25 DIAGNOSIS — I43 Cardiomyopathy in diseases classified elsewhere: Secondary | ICD-10-CM | POA: Diagnosis present

## 2023-03-25 DIAGNOSIS — C50919 Malignant neoplasm of unspecified site of unspecified female breast: Secondary | ICD-10-CM

## 2023-03-25 DIAGNOSIS — Z711 Person with feared health complaint in whom no diagnosis is made: Secondary | ICD-10-CM | POA: Diagnosis not present

## 2023-03-25 DIAGNOSIS — G928 Other toxic encephalopathy: Secondary | ICD-10-CM | POA: Diagnosis present

## 2023-03-25 DIAGNOSIS — R918 Other nonspecific abnormal finding of lung field: Secondary | ICD-10-CM | POA: Diagnosis not present

## 2023-03-25 DIAGNOSIS — D696 Thrombocytopenia, unspecified: Secondary | ICD-10-CM | POA: Diagnosis present

## 2023-03-25 DIAGNOSIS — I959 Hypotension, unspecified: Secondary | ICD-10-CM | POA: Diagnosis not present

## 2023-03-25 DIAGNOSIS — Z978 Presence of other specified devices: Secondary | ICD-10-CM | POA: Diagnosis not present

## 2023-03-25 DIAGNOSIS — Z789 Other specified health status: Secondary | ICD-10-CM | POA: Diagnosis not present

## 2023-03-25 DIAGNOSIS — I5033 Acute on chronic diastolic (congestive) heart failure: Secondary | ICD-10-CM | POA: Diagnosis not present

## 2023-03-25 DIAGNOSIS — E119 Type 2 diabetes mellitus without complications: Secondary | ICD-10-CM | POA: Diagnosis present

## 2023-03-25 DIAGNOSIS — L89312 Pressure ulcer of right buttock, stage 2: Secondary | ICD-10-CM | POA: Diagnosis present

## 2023-03-25 DIAGNOSIS — R9431 Abnormal electrocardiogram [ECG] [EKG]: Secondary | ICD-10-CM | POA: Diagnosis not present

## 2023-03-25 DIAGNOSIS — Z8673 Personal history of transient ischemic attack (TIA), and cerebral infarction without residual deficits: Secondary | ICD-10-CM | POA: Diagnosis not present

## 2023-03-25 DIAGNOSIS — I10 Essential (primary) hypertension: Secondary | ICD-10-CM | POA: Diagnosis not present

## 2023-03-25 DIAGNOSIS — Z853 Personal history of malignant neoplasm of breast: Secondary | ICD-10-CM | POA: Diagnosis not present

## 2023-03-25 DIAGNOSIS — L89616 Pressure-induced deep tissue damage of right heel: Secondary | ICD-10-CM | POA: Diagnosis not present

## 2023-03-25 DIAGNOSIS — N179 Acute kidney failure, unspecified: Secondary | ICD-10-CM | POA: Diagnosis not present

## 2023-03-25 DIAGNOSIS — Z7189 Other specified counseling: Secondary | ICD-10-CM | POA: Diagnosis not present

## 2023-03-25 DIAGNOSIS — Z8042 Family history of malignant neoplasm of prostate: Secondary | ICD-10-CM | POA: Diagnosis not present

## 2023-03-25 DIAGNOSIS — E854 Organ-limited amyloidosis: Secondary | ICD-10-CM | POA: Diagnosis present

## 2023-03-25 DIAGNOSIS — Z1152 Encounter for screening for COVID-19: Secondary | ICD-10-CM | POA: Diagnosis not present

## 2023-03-25 DIAGNOSIS — Z515 Encounter for palliative care: Secondary | ICD-10-CM | POA: Diagnosis not present

## 2023-03-25 DIAGNOSIS — L89626 Pressure-induced deep tissue damage of left heel: Secondary | ICD-10-CM | POA: Diagnosis not present

## 2023-03-25 DIAGNOSIS — G9340 Encephalopathy, unspecified: Secondary | ICD-10-CM | POA: Diagnosis present

## 2023-03-25 DIAGNOSIS — R339 Retention of urine, unspecified: Secondary | ICD-10-CM | POA: Diagnosis not present

## 2023-03-25 DIAGNOSIS — D0512 Intraductal carcinoma in situ of left breast: Secondary | ICD-10-CM | POA: Diagnosis not present

## 2023-03-25 DIAGNOSIS — R4189 Other symptoms and signs involving cognitive functions and awareness: Secondary | ICD-10-CM | POA: Diagnosis present

## 2023-03-25 DIAGNOSIS — M6281 Muscle weakness (generalized): Secondary | ICD-10-CM | POA: Diagnosis not present

## 2023-03-25 DIAGNOSIS — Z888 Allergy status to other drugs, medicaments and biological substances status: Secondary | ICD-10-CM | POA: Diagnosis not present

## 2023-03-25 DIAGNOSIS — C50912 Malignant neoplasm of unspecified site of left female breast: Secondary | ICD-10-CM | POA: Diagnosis not present

## 2023-03-25 DIAGNOSIS — R4182 Altered mental status, unspecified: Secondary | ICD-10-CM | POA: Diagnosis not present

## 2023-03-25 DIAGNOSIS — M48062 Spinal stenosis, lumbar region with neurogenic claudication: Secondary | ICD-10-CM | POA: Diagnosis present

## 2023-03-25 DIAGNOSIS — E46 Unspecified protein-calorie malnutrition: Secondary | ICD-10-CM | POA: Diagnosis not present

## 2023-03-25 DIAGNOSIS — I110 Hypertensive heart disease with heart failure: Secondary | ICD-10-CM | POA: Diagnosis present

## 2023-03-25 DIAGNOSIS — J189 Pneumonia, unspecified organism: Secondary | ICD-10-CM | POA: Diagnosis present

## 2023-03-25 DIAGNOSIS — I5032 Chronic diastolic (congestive) heart failure: Secondary | ICD-10-CM | POA: Diagnosis present

## 2023-03-25 DIAGNOSIS — D519 Vitamin B12 deficiency anemia, unspecified: Secondary | ICD-10-CM | POA: Diagnosis not present

## 2023-03-25 DIAGNOSIS — Z9013 Acquired absence of bilateral breasts and nipples: Secondary | ICD-10-CM | POA: Diagnosis not present

## 2023-03-25 DIAGNOSIS — I499 Cardiac arrhythmia, unspecified: Secondary | ICD-10-CM | POA: Diagnosis not present

## 2023-03-25 DIAGNOSIS — I679 Cerebrovascular disease, unspecified: Secondary | ICD-10-CM | POA: Diagnosis not present

## 2023-03-25 DIAGNOSIS — Z800 Family history of malignant neoplasm of digestive organs: Secondary | ICD-10-CM | POA: Diagnosis not present

## 2023-03-25 DIAGNOSIS — R531 Weakness: Secondary | ICD-10-CM | POA: Diagnosis not present

## 2023-03-25 DIAGNOSIS — I5023 Acute on chronic systolic (congestive) heart failure: Secondary | ICD-10-CM | POA: Diagnosis not present

## 2023-03-25 DIAGNOSIS — Z7401 Bed confinement status: Secondary | ICD-10-CM | POA: Diagnosis not present

## 2023-03-25 DIAGNOSIS — I480 Paroxysmal atrial fibrillation: Secondary | ICD-10-CM | POA: Diagnosis present

## 2023-03-25 DIAGNOSIS — E785 Hyperlipidemia, unspecified: Secondary | ICD-10-CM | POA: Diagnosis present

## 2023-03-25 DIAGNOSIS — I4891 Unspecified atrial fibrillation: Secondary | ICD-10-CM | POA: Diagnosis not present

## 2023-03-25 DIAGNOSIS — N390 Urinary tract infection, site not specified: Secondary | ICD-10-CM | POA: Diagnosis present

## 2023-03-25 DIAGNOSIS — L89153 Pressure ulcer of sacral region, stage 3: Secondary | ICD-10-CM | POA: Diagnosis not present

## 2023-03-25 DIAGNOSIS — G936 Cerebral edema: Secondary | ICD-10-CM | POA: Diagnosis present

## 2023-03-25 LAB — GLUCOSE, CAPILLARY
Glucose-Capillary: 97 mg/dL (ref 70–99)
Glucose-Capillary: 129 mg/dL — ABNORMAL HIGH (ref 70–99)
Glucose-Capillary: 226 mg/dL — ABNORMAL HIGH (ref 70–99)
Glucose-Capillary: 184 mg/dL — ABNORMAL HIGH (ref 70–99)

## 2023-03-25 MED ORDER — FUROSEMIDE 20 MG PO TABS: 20.0000 mg | ORAL_TABLET | Freq: Every day | ORAL | 0 refills | Status: DC

## 2023-03-25 MED ORDER — SPIRONOLACTONE 25 MG PO TABS: 12.5000 mg | ORAL_TABLET | Freq: Every day | ORAL | 0 refills | Status: DC

## 2023-03-25 MED ORDER — LOSARTAN POTASSIUM 25 MG PO TABS: 25.0000 mg | ORAL_TABLET | Freq: Every day | ORAL | 0 refills | Status: DC

## 2023-03-25 MED ORDER — DEXAMETHASONE 0.5 MG PO TABS: 0.5000 mg | ORAL_TABLET | ORAL | 0 refills | Status: DC

## 2023-03-25 MED ORDER — AMIODARONE HCL 200 MG PO TABS: 200.0000 mg | ORAL_TABLET | Freq: Every day | ORAL | 0 refills | Status: DC

## 2023-03-25 MED ORDER — METOPROLOL TARTRATE 50 MG PO TABS: 50.0000 mg | ORAL_TABLET | Freq: Two times a day (BID) | ORAL | 0 refills | Status: DC

## 2023-03-25 MED ORDER — CHLORHEXIDINE GLUCONATE CLOTH 2 % EX PADS: 6.0000 | MEDICATED_PAD | Freq: Every day | CUTANEOUS | Status: DC

## 2023-03-25 NOTE — Progress Notes (Signed)
Occupational Therapy Treatment Patient Details Name: Amanda Green MRN: [id number] DOB: Jul 24, 1935 Today's Date: 03/25/2023   History of present illness 88 y.o. 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   OT comments  Patient continues to be lethargic during therapy sessions and requires frequent cueing to be engaged in functional tasks. Pt Max A for bed mobility with encouragement from OT and family to move more. Patient did demo fair EOB sitting balance and tolerated mild perturbations, had one posterior LOB while sitting EOB needing Max A to recover. Encouraged patient to continue with AROM and exercises while supine in bed to maintain strength.    Recommendations for follow up therapy are one component of a multi-disciplinary discharge planning process, led by the attending physician.  Recommendations may be updated based on patient status, additional functional criteria and insurance authorization.    Assistance Recommended at Discharge Frequent or constant Supervision/Assistance  Patient can return home with the following  A lot of help with bathing/dressing/bathroom;Assistance with cooking/housework;Direct supervision/assist for medications management;Direct supervision/assist for financial management;Assist for transportation;Help with stairs or ramp for entrance;Two people to help with walking and/or transfers   Equipment Recommendations  Hospital bed    Recommendations for Other Services      Precautions / Restrictions Precautions Precautions: Fall Precaution Comments: Watch HR Restrictions Weight Bearing Restrictions: No       Mobility Bed Mobility Overal bed mobility: Needs  Assistance Bed Mobility: Supine to Sit, Sit to Supine     Supine to sit: Max assist Sit to supine: Max assist   General bed mobility comments: Pt needs cueing to engage in bed mobility    Transfers Overall transfer level: Needs assistance Equipment used: None Transfers: Sit to/from Stand Sit to Stand: Total assist           General transfer comment: BLEs will buckle when standing, unable to acheive full upright stand but bottom cleared bed     Balance Overall balance assessment: Needs assistance Sitting-balance support: Feet supported, Bilateral upper extremity supported Sitting balance-Leahy Scale: Fair Sitting balance - Comments: BUEs supported on bed, Pt able to tolerate mild pertubations while sitting EOB, cannot scoot Postural control: Posterior lean   Standing balance-Leahy Scale: Zero                             ADL either performed or assessed with clinical judgement   ADL Overall ADL's : Needs assistance/impaired                                       General ADL Comments: Pt low level, focused session on therex, balance, and bed mobility    Extremity/Trunk Assessment Upper Extremity Assessment Upper Extremity Assessment: Generalized weakness   Lower Extremity Assessment Lower Extremity Assessment: Generalized weakness   Cervical / Trunk Assessment Cervical / Trunk Assessment: Kyphotic    Vision       Perception     Praxis      Cognition Arousal/Alertness: Lethargic Behavior During Therapy: Flat affect Overall Cognitive Status: History of cognitive impairments - at  baseline                                 General Comments: Slow processing, needs cueing to reshift focus        Exercises      Shoulder Instructions       General Comments VSS on RA. Family present and encouraging patient to continue to move    Pertinent Vitals/ Pain       Pain Assessment Faces Pain Scale: Hurts little  more Pain Location: Hip with excessive PROM Pain Descriptors / Indicators: Discomfort Pain Intervention(s): Limited activity within patient's tolerance, Monitored during session, Repositioned  Home Living                                          Prior Functioning/Environment              Frequency  Min 2X/week        Progress Toward Goals  OT Goals(current goals can now be found in the care plan section)  Progress towards OT goals: Progressing toward goals  Acute Rehab OT Goals Patient Stated Goal: none stated OT Goal Formulation: With patient Time For Goal Achievement: 03/28/23 Potential to Achieve Goals: Good  Plan Discharge plan remains appropriate;Frequency remains appropriate    Co-evaluation                 AM-PAC OT "6 Clicks" Daily Activity     Outcome Measure   Help from another person eating meals?: A Little Help from another person taking care of personal grooming?: A Lot Help from another person toileting, which includes using toliet, bedpan, or urinal?: A Lot Help from another person bathing (including washing, rinsing, drying)?: A Lot Help from another person to put on and taking off regular upper body clothing?: A Lot Help from another person to put on and taking off regular lower body clothing?: A Lot 6 Click Score: 13    End of Session Equipment Utilized During Treatment: Gait belt  OT Visit Diagnosis: Unsteadiness on feet (R26.81);Other abnormalities of gait and mobility (R26.89);Muscle weakness (generalized) (M62.81);History of falling (Z91.81)   Activity Tolerance Patient limited by fatigue   Patient Left with call bell/phone within reach;with family/visitor present;in bed   Nurse Communication Mobility status;Need for lift equipment        Time: 8841-6606 OT Time Calculation (min): 23 min  Charges: OT General Charges $OT Visit: 1 Visit OT Treatments $Therapeutic Activity: 8-22 mins $Therapeutic Exercise:  8-22 mins  03/25/2023  AB, OTR/L  Acute Rehabilitation Services  Office: [phone]   Tristan Schroeder 03/25/2023, 12:30 PM

## 2023-03-25 NOTE — Plan of Care (Signed)
  Problem: Health Behavior/Discharge Planning: Goal: Ability to identify and utilize available resources and services will improve 03/25/2023 2154 by Milon Score, RN Outcome: Progressing 03/25/2023 2152 by Milon Score, RN Outcome: Progressing Goal: Ability to manage health-related needs will improve 03/25/2023 2154 by Milon Score, RN Outcome: Progressing 03/25/2023 2152 by Milon Score, RN Outcome: Progressing   Problem: Health Behavior/Discharge Planning: Goal: Ability to manage health-related needs will improve 03/25/2023 2154 by Milon Score, RN Outcome: Progressing 03/25/2023 2152 by Milon Score, RN Outcome: Progressing

## 2023-03-25 NOTE — Progress Notes (Signed)
Patient Care Team: Serena CroissantGudena, Priya Matsen, MD as PCP - General (Hematology and Oncology) Thurmon Fairroitoru, Mihai, MD as PCP - Cardiology (Cardiology) Luretha MurphyMartin, Matthew, MD as Consulting Physician (General Surgery) Levert FeinsteinYan, Yijun, MD as Consulting Physician (Neurology)  DIAGNOSIS:  Encounter Diagnosis  Name Primary?   Malignant neoplasm of breast metastatic to brain, unspecified laterality Yes    SUMMARY OF ONCOLOGIC HISTORY: Oncology History  Breast cancer metastasized to brain  06/10/2019 Initial Diagnosis   Ductal carcinoma in situ (DCIS) of left breast   09/25/2019 Genetic Testing   EPCAM VUS but otherwise negative genetic testing on the common hereditary cancer panel.  The Common Hereditary Gene Panel offered by [facility] includes sequencing and/or deletion duplication testing of the following 48 genes: APC, ATM, AXIN2, BARD1, BMPR1A, BRCA1, BRCA2, BRIP1, CDH1, CDK4, CDKN2A (p14ARF), CDKN2A (p16INK4a), CHEK2, CTNNA1, DICER1, EPCAM (Deletion/duplication testing only), GREM1 (promoter region deletion/duplication testing only), KIT, MEN1, MLH1, MSH2, MSH3, MSH6, MUTYH, NBN, NF1, NHTL1, PALB2, PDGFRA, PMS2, POLD1, POLE, PTEN, RAD50, RAD51C, RAD51D, RNF43, SDHB, SDHC, SDHD, SMAD4, SMARCA4. STK11, TP53, TSC1, TSC2, and VHL.  The following genes were evaluated for sequence changes only: SDHA and HOXB13 c.251G>A variant only. The report date is 09/25/2019.   12/26/2020 Cancer Staging   Staging form: Breast, AJCC 8th Edition - Clinical: Stage 0 (cTis (DCIS), cN0, cM0(i+)) - Signed by Lowella DellMagrinat, Gustav C, MD on 12/26/2020     CHIEF COMPLIANT: Metastatic breast cancer seeking second opinion  INTERVAL HISTORY: Amanda Green is a 88 year old with above-mentioned history of DCIS followed by invasive cancer of the breast she had bilateral mastectomies previously.  She was recently diagnosed with a recurrence of breast cancer and metastatic disease to the brain.  She has been admitted to the hospital with atrial  fibrillation and over the course of the hospitalization she has been getting weaker and tired and more fatigued.  She has been mostly in bed and does not appear to be recovering well.  Family connected with me by telephone to discuss what I would advise from the metastatic breast cancer standpoint.  The pathology came back that this was estrogen negative and HER2 positive subtype.  I could not see speak to the patient directly and all the communication was with through her daughter.   ALLERGIES:  is allergic to gabapentin.  MEDICATIONS:  No current facility-administered medications for this visit.   No current outpatient medications on file.   Facility-Administered Medications Ordered in Other Visits  Medication Dose Route Frequency Provider Last Rate Last Admin   0.9 %  sodium chloride infusion   Intravenous PRN Candelaria StagersGonfa, Taye T, MD 5 mL/hr at 03/14/23 1753 Infusion Verify at 03/14/23 1753   acetaminophen (TYLENOL) tablet 650 mg  650 mg Oral Q6H PRN Orland MustardWolfe, Allison, MD   650 mg at 03/10/23 1814   Or   acetaminophen (TYLENOL) suppository 650 mg  650 mg Rectal Q6H PRN Orland MustardWolfe, Allison, MD       amiodarone (PACERONE) tablet 200 mg  200 mg Oral Daily Lewayne Buntingrenshaw, Brian S, MD   200 mg at 03/25/23 0855   brimonidine (ALPHAGAN) 0.2 % ophthalmic solution 1 drop  1 drop Left Eye BID Orland MustardWolfe, Allison, MD   1 drop at 03/25/23 0854   And   timolol (TIMOPTIC) 0.5 % ophthalmic solution 1 drop  1 drop Left Eye BID Orland MustardWolfe, Allison, MD   1 drop at 03/25/23 0852   [START ON 03/26/2023] Chlorhexidine Gluconate Cloth 2 % PADS 6 each  6 each Topical Q0600 Arrien, Delrae SawyersMauricio  Reuel Boom, MD       dexamethasone (DECADRON) tablet 0.5 mg  0.5 mg Oral Q48H Arrien, York Ram, MD   0.5 mg at 03/24/23 0856   dorzolamide (TRUSOPT) 2 % ophthalmic solution 1 drop  1 drop Left Eye BID Orland Mustard, MD   1 drop at 03/25/23 0856   furosemide (LASIX) tablet 20 mg  20 mg Oral Daily Arrien, York Ram, MD   20 mg at 03/25/23 0900    Gerhardt's butt cream   Topical BID Almon Hercules, MD   Given at 03/24/23 2202   insulin aspart (novoLOG) injection 0-9 Units  0-9 Units Subcutaneous TID WC Orland Mustard, MD   2 Units at 03/24/23 1735   losartan (COZAAR) tablet 25 mg  25 mg Oral Daily Coralie Keens, MD   25 mg at 03/25/23 0855   metoprolol tartrate (LOPRESSOR) tablet 50 mg  50 mg Oral BID Ronie Spies N, PA-C   50 mg at 03/25/23 7591   Muscle Rub CREA   Topical PRN Arrien, York Ram, MD       ofloxacin (OCUFLOX) 0.3 % ophthalmic solution 1 drop  1 drop Right Eye QID Orland Mustard, MD   1 drop at 03/25/23 6384   Oral care mouth rinse  15 mL Mouth Rinse PRN Noralee Stain, DO       prednisoLONE acetate (PRED FORTE) 1 % ophthalmic suspension 1 drop  1 drop Right Eye QID Orland Mustard, MD   1 drop at 03/25/23 6659   spironolactone (ALDACTONE) tablet 12.5 mg  12.5 mg Oral Daily Arrien, York Ram, MD   12.5 mg at 03/25/23 0855   Tafamidis CAPS 61 mg  61 mg Oral Daily Orland Mustard, MD   61 mg at 03/25/23 0902    PHYSICAL EXAMINATION: ECOG PERFORMANCE STATUS: Cannot be assessed    LABORATORY DATA:  I have reviewed the data as listed    Latest Ref Rng & Units 03/22/2023    1:57 AM 03/21/2023    2:19 AM 03/19/2023    6:43 AM  CMP  Glucose 70 - 99 mg/dL 935  701  779   BUN 8 - 23 mg/dL 16  19  16    Creatinine 0.44 - 1.00 mg/dL 3.90  3.00  9.23   Sodium 135 - 145 mmol/L 142  140  144   Potassium 3.5 - 5.1 mmol/L 3.6  3.8  4.0   Chloride 98 - 111 mmol/L 111  113  114   CO2 22 - 32 mmol/L 25  24  26    Calcium 8.9 - 10.3 mg/dL 8.7  8.6  8.6     Lab Results  Component Value Date   WBC 4.0 03/21/2023   HGB 10.4 (L) 03/21/2023   HCT 30.4 (L) 03/21/2023   MCV 87.6 03/21/2023   PLT 83 (L) 03/21/2023   NEUTROABS 11.2 (H) 03/09/2023    ASSESSMENT & PLAN:  Breast cancer metastasized to brain 1999: Right mastectomy 03/04/2019: Left breast DCIS grade 3 ER positive PR negative, left lumpectomy: DCIS 2.7 cm  tamoxifen discontinued April 2022, genetics negative 05/10/2021: Left mastectomy IDC ER/PR negative Ki67 15% HER2 positive 02/14/2023: Left occipital lobe brain metastases 3.1 cm, 9 mm right lateral ventricle mass 03/04/2023: SRS to brain 03/09/2023: Hospitalization for A-fib, CT PE protocol: Normal left breast/left axillary mass  Treatment plan: Prognosis/counseling discussion: I discussed with the patient's daughter that I was able to review her chart and went through all the notes records and given the  current episode of metastatic breast cancer that is HER2 positive, it indicates a very aggressive breast cancer.  However it appears that her performance status is rapidly declining.  She has been sleeping most of the day and although she is awake and alert, she is not very active out of the bed. Plan: If she does not improve her performance status in the next 2 weeks then we would recommend proceeding towards hospice care.  If she does improve I would like to see her in my office so that we can talk about anti-HER2 based treatments.  Family understands this approach and are acceptable.     No orders of the defined types were placed in this encounter.  The patient has a good understanding of the overall plan. she agrees with it. she will call with any problems that may develop before the next visit here. Total time spent: 30 mins including face to face time and time spent for planning, charting and co-ordination of care   Tamsen Meek, MD 03/25/23

## 2023-03-25 NOTE — TOC Transition Note (Signed)
Transition of Care [facility]) - CM/SW Discharge Note   Patient Details  Name: Amanda Green MRN: [id number] Date of Birth: [birth year]  [facility]) CM/SW Contact:  Delilah Shan, LCSWA Phone Number: 03/25/2023, 1:04 PM   Clinical Narrative:     Patient will DC to: [facility]  Anticipated DC date: 03/25/2023  Family notified: Massie Bougie  Transport by: Sharin Mons  ?  Per MD patient ready for DC to [facility] . RN, patient, patient's family, and facility notified of DC. Discharge Summary sent to facility. RN given number for report tele# [phone] RM# [id number]. DC packet on chart. Ambulance transport requested for patient.  CSW signing off.   Final next level of care: Skilled Nursing Facility Barriers to Discharge: No Barriers Identified   Patient Goals and CMS Choice CMS Medicare.gov Compare Post Acute Care list provided to:: Patient Represenative (must comment) Choice offered to / list presented to : Adult Children  Discharge Placement                Patient chooses bed at: [facility] Patient to be transferred to facility by: [facility] Name of family member notified: Belinda Patient and family notified of of transfer: 03/25/23  Discharge Plan and Services Additional resources added to the After Visit Summary for   In-house Referral: Clinical Social Work                                   Social Determinants of Health (SDOH) Interventions SDOH Screenings   Food Insecurity: No Food Insecurity (03/10/2023)  Housing: Low Risk  (03/10/2023)  Transportation Needs: No Transportation Needs (03/10/2023)  Utilities: Not At Risk (03/10/2023)  Tobacco Use: Low Risk  (03/09/2023)     Readmission Risk Interventions     No data to display

## 2023-03-25 NOTE — Progress Notes (Signed)
[facility] Care to give report. No answer.  Hinton Dyer, RN

## 2023-03-25 NOTE — Plan of Care (Deleted)
  Problem: Education: Goal: Ability to describe self-care measures that may prevent or decrease complications (Diabetes Survival Skills Education) will improve Outcome: Progressing Goal: Individualized Educational Video(s) Outcome: Progressing   Problem: Coping: Goal: Ability to adjust to condition or change in health will improve Outcome: Progressing   Problem: Fluid Volume: Goal: Ability to maintain a balanced intake and output will improve Outcome: Progressing   Problem: Metabolic: Goal: Ability to maintain appropriate glucose levels will improve Outcome: Progressing   Problem: Nutritional: Goal: Maintenance of adequate nutrition will improve Outcome: Progressing Goal: Progress toward achieving an optimal weight will improve Outcome: Progressing   Problem: Health Behavior/Discharge Planning: Goal: Ability to manage health-related needs will improve Outcome: Progressing   Problem: Clinical Measurements: Goal: Ability to maintain clinical measurements within normal limits will improve Outcome: Progressing Goal: Will remain free from infection Outcome: Progressing Goal: Diagnostic test results will improve Outcome: Progressing Goal: Respiratory complications will improve Outcome: Progressing Goal: Cardiovascular complication will be avoided Outcome: Progressing   Problem: Health Behavior/Discharge Planning: Goal: Ability to manage health-related needs will improve Outcome: Progressing

## 2023-03-25 NOTE — Progress Notes (Addendum)
Report given to [facility] of [employer].  [phone] - left the unit with the [facility]. Not in distress. With foley cath.

## 2023-03-25 NOTE — Assessment & Plan Note (Signed)
1999: Right mastectomy 03/04/2019: Left breast DCIS grade 3 ER positive PR negative, left lumpectomy: DCIS 2.7 cm tamoxifen discontinued April 2022, genetics negative 05/10/2021: Left mastectomy IDC ER/PR negative Ki67 15% HER2 positive 02/14/2023: Left occipital lobe brain metastases 3.1 cm, 9 mm right lateral ventricle mass 03/04/2023: SRS to brain 03/09/2023: Hospitalization for A-fib, CT PE protocol: Normal left breast/left axillary mass  Treatment plan: Prognosis/counseling discussion: I discussed with the patient's daughter that I was able to review her chart and went through all the notes records and given the current episode of metastatic breast cancer that is HER2 positive, it indicates a very aggressive breast cancer.  However it appears that her performance status is rapidly declining.  She has been sleeping most of the day and although she is awake and alert, she is not very active out of the bed. Plan: If she does not improve her performance status in the next 2 weeks then we would recommend proceeding towards hospice care.  If she does improve I would like to see her in my office so that we can talk about anti-HER2 based treatments.  Family understands this approach and are acceptable.

## 2023-03-25 NOTE — Care Management Important Message (Signed)
Important Message  Patient Details  Name: Amanda Green MRN: [id number] Date of Birth: 05-24-1935   Medicare Important Message Given:  Yes     Renie Ora 03/25/2023, 12:12 PM

## 2023-03-25 NOTE — TOC Progression Note (Addendum)
Transition of Care [facility]) - Progression Note    Patient Details  Name: Amanda Green MRN: [id number] Date of Birth: May 06, 1935  [facility] CM/SW Contact  Delilah Shan, LCSWA Phone Number: 03/25/2023, 11:00 AM  Clinical Narrative:     CSW spoke with Kia with [facility] who informed CSW that they have a bed open today for patient. CSW informed MD. CSW will continue to follow and assist with patients dc planning needs.  CSW called patients insurance who confirmed insurance authorization has been approved. Plan Auth ID# [id number] Auth ID# [id number]. Insurance authorization has been approved from 4/6-4/10.     Barriers to Discharge: English as a second language teacher  Expected Discharge Plan and Services In-house Referral: Clinical Social Work     Living arrangements for the past 2 months: Single Family Home                                       Social Determinants of Health (SDOH) Interventions SDOH Screenings   Food Insecurity: No Food Insecurity (03/10/2023)  Housing: Low Risk  (03/10/2023)  Transportation Needs: No Transportation Needs (03/10/2023)  Utilities: Not At Risk (03/10/2023)  Tobacco Use: Low Risk  (03/09/2023)    Readmission Risk Interventions     No data to display

## 2023-03-25 NOTE — Discharge Summary (Addendum)
Physician Discharge Summary   Patient: Amanda Green MRN: [id number] DOB: 08-12-1935  Admit date:     03/09/2023  Discharge date: 03/25/23  Discharge Physician: Amanda Green   PCP: Amanda Croissant, MD   Recommendations at discharge:    Patient has been placed on guideline medical therapy for heart failure. Follow up renal function and electrolytes in 7 days as outpatient If patient has progressive decline in her functional status, to consider transition to hospice. Continue taper dexamethasone for brain metastases.  Follow up with Dr Amanda Green as scheduled.  Patient failed voiding trial and will be discharge with foley catheter to try voiding trial as outpatient.   Discharge Diagnoses: Principal Problem:   Atrial fibrillation with RVR Active Problems:   Acute on chronic diastolic CHF (congestive heart failure)   AKI (acute kidney injury)   Breast cancer metastasized to brain   Cardiac amyloidosis   Controlled type 2 diabetes mellitus without complication, without long-term current use of insulin   Essential hypertension   Cognitive impairment   Pressure injury of skin  Resolved Problems:   * No resolved hospital problems. [facility] Course: Amanda Green was admitted to the hospital with the working diagnosis of atrial fibrillation with RVR complicated with heart failure exacerbation.   88 yo female with the past medical history of breast cancer complicated with brain metastasis, T2DM, hypertension, dyslipidemia, cardiac amyloid and history of CVA who presented with dyspnea and lower extremity edema. Progressive edema at home, not able to move due to edema and dyspnea on exertion. On her initial physical examination her blood pressure was 121/83, HR 109, RR 18 and 02 saturation 93%, lungs with no wheezing or rales, heart with S1 and S2 present irregularly irregular with no gallops, rubs or murmurs, abdomen with no distention, and positive lower extremity edema.   Na  125, K 4,3 CL 95, bicarbonate 23 glucose 254 bun 26 cr 0,89 High sensitive troponin 51  Wbc 12,3 hgb 14,0 plt 149  Sars covid 19 negative   Chest radiograph with left rotation, positive cardiomegaly with no infiltrates.  CT chest with bilateral ground glass opacities, no pulmonary embolisms. Known left breast/ left axillary mass similar that prior exam. Stable left adrenal adenoma.   EKG 131 bpm, normal axis, normal qtc, atrial fibrillation rhythm with no significant ST segment or T wave changes.   Patient was treated with diuretics and AV blockade. Progressive decline in her health. Palliative care was consulted.  Positive urinary retention requiring foley catheter.  Oncology consulted for thrombocytopenia.  Insurance declined SNF but family has appealed.   Rate control atrial fibrillation with amiodarone, no anticoagulation due to bleeding risk.   Patient medically stable. Echocardiogram with preserved LV systolic function.   04/06 patient medically stable, pending appeal for SNF. If denied plan to arrange home health services.  04/07 patient has been approved for SNF, pending bed to be available.  04/08 patient has a bed at SNF, foley catheter has been removed for voiding trial prior to discharge. Unfortunately continue urinary retention and foley had to be replaced.   Assessment and Plan: * Atrial fibrillation with RVR Patient will continue with amiodarone for rate control Risk vs benefit, anticoagulation on hold due to brain mets and thrombocytopenia.   Acute on chronic diastolic CHF (congestive heart failure) Echocardiogram with preserved LV systolic function 60 to 65%, mild LVH, RV systolic function preserved, LA with mild dilatation, RVSP 58.1 mmHg, no significant valvular disease.   Elevated troponin due  to heart failure exacerbation and demand ischemia.   Patient was diuresed with IV furosemide, negative fluid balance was achieved, -13,169 ml, with significant improvement  in her symptoms.   Continue with metoprolol, losartan, furosemide and spironolactone.   No SGLT 2 inh due to urinary retention and risk for urinary tract infection.  Tafamidis for TTR Amyloidosis.   AKI (acute kidney injury) Hyponatremia.   Renal function has been stable, at the time of her discharge her serum cr is  0,63 with K at 3,6 and serum bicarbonate at 25 Na 142 and Mg 1,8  Continue diuresis with furosemide and spironolactone.  Follow up renal function and electrolytes in 7 days as outpatient.   Breast cancer metastasized to brain HER2 positive, with brain metastasis, indicating aggressive disease.  Rapid decline in her performance status, if she has no significant improvement in 2 weeks, recommendation to transition to hospice services.  Continue with dexamethasone, taper per outpatient recommendations.    Thrombocytopenia, with stable plt.   Cardiac amyloidosis Continue tafamidis 61 mg p.o. daily    Controlled type 2 diabetes mellitus without complication, without long-term current use of insulin Her glucose remained stable during her hospitalization, she was treated with insulin sliding scale for glucose cover and monitoring.   Essential hypertension Blood pressure stable, continue with spironolactone, metoprolol, and losartan.   Cognitive impairment Continue namenda Bid  Delerium precautions   Pressure injury of skin Pressure Injury 03/09/23 Buttocks Right Stage 2 -  Partial thickness loss of dermis presenting as a shallow open injury with a red, pink wound bed without slough. less than 1cm size. pink/red (Active)  03/09/23 1854  Location: Buttocks  Location Orientation: Right  Staging: Stage 2 -  Partial thickness loss of dermis presenting as a shallow open injury with a red, pink wound bed without slough.  Wound Description (Comments): less than 1cm size. pink/red  Present on Admission: Yes  Dressing Type Foam - Lift dressing to assess site every shift  03/24/23 2000  Present on admission.   Leukocytosis Secondary to steroids No s/sx of infectious etiology          Consultants: cardiology, oncology  Procedures performed: none   Disposition: Skilled nursing facility Diet recommendation:  Discharge Diet Orders (From admission, onward)     Start     Ordered   03/25/23 0000  Diet - low sodium heart healthy        03/25/23 1154           Cardiac and Carb modified diet DISCHARGE MEDICATION: Allergies as of 03/25/2023       Reactions   Gabapentin    Drowsiness        Medication List     STOP taking these medications    apixaban 5 MG Tabs tablet Commonly known as: ELIQUIS   diltiazem 180 MG 24 hr capsule Commonly known as: TIAZAC   hydrochlorothiazide 12.5 MG tablet Commonly known as: HYDRODIURIL   IRON PO   Lantus SoloStar 100 UNIT/ML Solostar Pen Generic drug: insulin glargine   lisinopril 5 MG tablet Commonly known as: ZESTRIL   metFORMIN 500 MG tablet Commonly known as: GLUCOPHAGE   pregabalin 75 MG capsule Commonly known as: LYRICA       TAKE these medications    amiodarone 200 MG tablet Commonly known as: PACERONE Take 1 tablet (200 mg total) by mouth daily. Start taking on: March 26, 2023   ascorbic acid 500 MG tablet Commonly known as: VITAMIN C Take 500 mg  by mouth daily.   atorvastatin 80 MG tablet Commonly known as: LIPITOR Take 80 mg by mouth daily.   brimonidine-timolol 0.2-0.5 % ophthalmic solution Commonly known as: COMBIGAN Place 1 drop into the left eye every 12 (twelve) hours.   calcium carbonate 1500 (600 Ca) MG Tabs tablet Commonly known as: OSCAL Take 1,500 mg by mouth daily with breakfast.   cyanocobalamin 1000 MCG tablet Commonly known as: VITAMIN B12 Take 1,000 mcg by mouth daily.   dexamethasone 0.5 MG tablet Commonly known as: DECADRON Take 1 tablet (0.5 mg total) by mouth every other day for 4 days. Start taking on: March 26, 2023 What changed:   medication strength how much to take when to take this   dorzolamide 2 % ophthalmic solution Commonly known as: TRUSOPT Place 1 drop into the left eye 2 (two) times daily.   furosemide 20 MG tablet Commonly known as: LASIX Take 1 tablet (20 mg total) by mouth daily. Start taking on: March 26, 2023   losartan 25 MG tablet Commonly known as: COZAAR Take 1 tablet (25 mg total) by mouth daily. Start taking on: March 26, 2023   magnesium 30 MG tablet Take 30 mg by mouth daily.   memantine 10 MG tablet Commonly known as: Namenda Take 1 tablet (10 mg total) by mouth 2 (two) times daily.   metoprolol tartrate 50 MG tablet Commonly known as: LOPRESSOR Take 1 tablet (50 mg total) by mouth 2 (two) times daily.   ofloxacin 0.3 % ophthalmic solution Commonly known as: OCUFLOX Place 1 drop into the right eye 4 (four) times daily.   prednisoLONE acetate 1 % ophthalmic suspension Commonly known as: PRED FORTE Place 1 drop into the right eye 4 (four) times daily.   Rocklatan 0.02-0.005 % Soln Generic drug: Netarsudil-Latanoprost Place 1 drop into the left eye at bedtime.   spironolactone 25 MG tablet Commonly known as: ALDACTONE Take 0.5 tablets (12.5 mg total) by mouth daily. Start taking on: March 26, 2023   Vyndamax 61 MG Caps Generic drug: Tafamidis Take 61 mg by mouth daily.        Discharge Exam: Filed Weights   03/23/23 0459 03/24/23 0430 03/25/23 0509  Weight: 72 kg 67.4 kg 72.1 kg   BP 118/60 (BP Location: Right Arm)   Pulse 62   Temp 97.8 F (36.6 C) (Oral)   Resp 18   Ht  (1.6 m)   Wt 72.1 kg   SpO2 93%   BMI 28.16 kg/m   Patient is feeling well, no dyspnea or chest pain, continue very weak and deconditioned  Neurology awake and alert ENT with mild pallor Cardiovascular with S1 and S2 present, irregularly irregular with no gallops, rubs or murmurs No JVD No lower extremity edema Respiratory with no rales or wheezing Abdomen with no  distention   Condition at discharge: stable  The results of significant diagnostics from this hospitalization (including imaging, microbiology, ancillary and laboratory) are listed below for reference.   Imaging Studies: ECHOCARDIOGRAM COMPLETE  Result Date: 03/21/2023    ECHOCARDIOGRAM REPORT   Patient Name:   LEESHA VENO Date of Exam: 03/21/2023 Medical Rec #:  [id number]          Height:       63.0 in Accession #:    [id number]         Weight:       154.5 lb Date of Birth:  Jul 07, 1935          BSA:  1.733 m Patient Age:    88 years           BP:           117/80 mmHg Patient Gender: F                  HR:           128 bpm. Exam Location:  Inpatient Procedure: 2D Echo, Cardiac Doppler and Color Doppler Indications:    A- fib  History:        Patient has prior history of Echocardiogram examinations, most                 recent 04/15/2021. Arrythmias:Atrial Fibrillation; Risk                 Factors:Hypertension and Diabetes. Hx of amyloidosis.  Sonographer:    Lucy Antigua Referring Phys: [id number] TAYE T GONFA IMPRESSIONS  1. Left ventricular ejection fraction, by estimation, is 60 to 65%. The left ventricle has normal function. The left ventricle has no regional wall motion abnormalities. There is mild concentric left ventricular hypertrophy. Left ventricular diastolic parameters are indeterminate. Elevated left ventricular end-diastolic pressure.  2. Right ventricular systolic function is normal. The right ventricular size is normal. There is moderately elevated pulmonary artery systolic pressure.  3. Left atrial size was mildly dilated.  4. Moderate pleural effusion in the left lateral region.  5. The mitral valve is normal in structure. Trivial mitral valve regurgitation. No evidence of mitral stenosis.  6. The aortic valve is tricuspid. There is mild calcification of the aortic valve. There is mild thickening of the aortic valve. Aortic valve regurgitation is not visualized. Aortic valve  sclerosis/calcification is present, without any evidence of aortic stenosis. Aortic valve area, by VTI measures 1.99 cm. Aortic valve mean gradient measures 2.0 mmHg. Aortic valve Vmax measures 1.08 m/s.  7. The inferior vena cava is normal in size with greater than 50% respiratory variability, suggesting right atrial pressure of 3 mmHg. FINDINGS  Left Ventricle: Left ventricular ejection fraction, by estimation, is 60 to 65%. The left ventricle has normal function. The left ventricle has no regional wall motion abnormalities. The left ventricular internal cavity size was normal in size. There is  mild concentric left ventricular hypertrophy. Left ventricular diastolic parameters are indeterminate. Elevated left ventricular end-diastolic pressure. Right Ventricle: The right ventricular size is normal. No increase in right ventricular wall thickness. Right ventricular systolic function is normal. There is moderately elevated pulmonary artery systolic pressure. The tricuspid regurgitant velocity is 3.71 m/s, and with an assumed right atrial pressure of 3 mmHg, the estimated right ventricular systolic pressure is 58.1 mmHg. Left Atrium: Left atrial size was mildly dilated. Right Atrium: Right atrial size was normal in size. Pericardium: There is no evidence of pericardial effusion. Mitral Valve: The mitral valve is normal in structure. There is mild thickening of the mitral valve leaflet(s). Trivial mitral valve regurgitation. No evidence of mitral valve stenosis. Tricuspid Valve: The tricuspid valve is normal in structure. Tricuspid valve regurgitation is mild . No evidence of tricuspid stenosis. Aortic Valve: The aortic valve is tricuspid. There is mild calcification of the aortic valve. There is mild thickening of the aortic valve. Aortic valve regurgitation is not visualized. Aortic valve sclerosis/calcification is present, without any evidence of aortic stenosis. Aortic valve mean gradient measures 2.0 mmHg.  Aortic valve peak gradient measures 4.7 mmHg. Aortic valve area, by VTI measures 1.99 cm. Pulmonic Valve: The pulmonic valve was  normal in structure. Pulmonic valve regurgitation is not visualized. No evidence of pulmonic stenosis. Aorta: The aortic root is normal in size and structure. Venous: The inferior vena cava is normal in size with greater than 50% respiratory variability, suggesting right atrial pressure of 3 mmHg. IAS/Shunts: No atrial level shunt detected by color flow Doppler. Additional Comments: There is a moderate pleural effusion in the left lateral region.  LEFT VENTRICLE PLAX 2D LVIDd:         3.30 cm   Diastology LVIDs:         2.40 cm   LV e' medial:    5.00 cm/s LV PW:         1.30 cm   LV E/e' medial:  21.8 LV IVS:        1.30 cm   LV e' lateral:   5.77 cm/s LVOT diam:     2.00 cm   LV E/e' lateral: 18.9 LV SV:         46 LV SV Index:   26 LVOT Area:     3.14 cm  RIGHT VENTRICLE RV S prime:     18.10 cm/s TAPSE (M-mode): 1.9 cm LEFT ATRIUM           Index        RIGHT ATRIUM           Index LA Vol (A2C): 17.6 ml 10.16 ml/m  RA Area:     12.50 cm LA Vol (A4C): 53.2 ml 30.70 ml/m  RA Volume:   26.90 ml  15.52 ml/m  AORTIC VALVE AV Area (Vmax):    1.94 cm AV Area (Vmean):   2.00 cm AV Area (VTI):     1.99 cm AV Vmax:           108.00 cm/s AV Vmean:          66.600 cm/s AV VTI:            0.229 m AV Peak Grad:      4.7 mmHg AV Mean Grad:      2.0 mmHg LVOT Vmax:         66.60 cm/s LVOT Vmean:        42.300 cm/s LVOT VTI:          0.145 m LVOT/AV VTI ratio: 0.63  AORTA Ao Root diam: 2.90 cm Ao Asc diam:  2.80 cm MITRAL VALVE                TRICUSPID VALVE MV Area (PHT): 4.32 cm     TR Peak grad:   55.1 mmHg MV Decel Time: 176 msec     TR Vmax:        371.00 cm/s MV E velocity: 108.83 cm/s MV A velocity: 21.61 cm/s   SHUNTS MV E/A ratio:  5.04         Systemic VTI:  0.14 m                             Systemic Diam: 2.00 cm Chilton Si MD Electronically signed by Chilton Si MD  Signature Date/Time: 03/21/2023/1:25:19 PM    Final    VAS [place] LOWER EXTREMITY VENOUS (DVT)  Result Date: 03/18/2023  Lower Venous DVT Study Patient Name:  IBET WAAG  Date of Exam:   03/17/2023 Medical Rec #: [id number]           Accession #:    [id number] Date of  Birth: 09/23/1935           Patient Gender: F Patient Age:   73 years Exam Location:  [facility] Procedure:      VAS [place] LOWER EXTREMITY VENOUS (DVT) Referring Phys: Alwyn Ren GONFA --------------------------------------------------------------------------------  Indications: Edema, SOB, and cardiac amyloidosis, A-fib with RVR.  Risk Factors: Cancer BRCA with brain mets. Limitations: Positioning and body habitus. Comparison Study: No prior study on file Performing Technologist: Sherren Kerns RVS  Examination Guidelines: A complete evaluation includes B-mode imaging, spectral Doppler, color Doppler, and power Doppler as needed of all accessible portions of each vessel. Bilateral testing is considered an integral part of a complete examination. Limited examinations for reoccurring indications may be performed as noted. The reflux portion of the exam is performed with the patient in reverse Trendelenburg.  +---------+---------------+---------+-----------+----------+-------------------+ RIGHT    CompressibilityPhasicitySpontaneityPropertiesThrombus Aging      +---------+---------------+---------+-----------+----------+-------------------+ CFV      Full           Yes      Yes                                      +---------+---------------+---------+-----------+----------+-------------------+ SFJ      Full                                                             +---------+---------------+---------+-----------+----------+-------------------+ FV Prox  Full           Yes      Yes                                      +---------+---------------+---------+-----------+----------+-------------------+ FV Mid                   Yes      Yes                                      +---------+---------------+---------+-----------+----------+-------------------+ FV Distal                                             Not well visualized +---------+---------------+---------+-----------+----------+-------------------+ PFV      Full                                                             +---------+---------------+---------+-----------+----------+-------------------+ POP      Full           Yes      Yes                                      +---------+---------------+---------+-----------+----------+-------------------+ PTV  Not well visualized +---------+---------------+---------+-----------+----------+-------------------+ PERO                                                  Not well visualized +---------+---------------+---------+-----------+----------+-------------------+   +---------+---------------+---------+-----------+----------+-------------------+ LEFT     CompressibilityPhasicitySpontaneityPropertiesThrombus Aging      +---------+---------------+---------+-----------+----------+-------------------+ CFV      Full           Yes      Yes                                      +---------+---------------+---------+-----------+----------+-------------------+ SFJ      Full                                                             +---------+---------------+---------+-----------+----------+-------------------+ FV Prox  Full                                                             +---------+---------------+---------+-----------+----------+-------------------+ FV Mid   Full           Yes      Yes                                      +---------+---------------+---------+-----------+----------+-------------------+ FV DistalFull                                                              +---------+---------------+---------+-----------+----------+-------------------+ PFV      Full                                                             +---------+---------------+---------+-----------+----------+-------------------+ POP                     Yes      Yes                                      +---------+---------------+---------+-----------+----------+-------------------+ PTV                                                   Not well visualized +---------+---------------+---------+-----------+----------+-------------------+ PERO  Not well visualized +---------+---------------+---------+-----------+----------+-------------------+     Summary: BILATERAL: -No evidence of popliteal cyst, bilaterally. RIGHT: - There is no evidence of deep vein thrombosis in the lower extremity. However, portions of this examination were limited- see technologist comments above.  LEFT: - There is no evidence of deep vein thrombosis in the lower extremity. However, portions of this examination were limited- see technologist comments above.  *See table(s) above for measurements and observations. Electronically signed by Sherald Hesshristopher Clark MD on 03/18/2023 at 10:52:31 AM.    Final    US RENAL  Result Date: 03/13/2023 CLINICAL DATA:  Acute urinary retention EXAM: RENAL / URINARY TRACT ULTRASOUND COMPLETE COMPARISON:  02/14/2023 FINDINGS: Right Kidney: Renal measurements: 11.3 x 4.3 x4.2 cm = volume: 107.5 mL. Echogenicity within normal limits. No mass or hydronephrosis visualized. Left Kidney: Renal measurements: 9.1 x 5.1 x 5.1 cm = volume: 124.6 mL. Echogenicity within normal limits. No mass or hydronephrosis visualized. Bladder: Appears normal for degree of bladder distention. Other: Foley in the bladder which is empty IMPRESSION: Negative for hydronephrosis. Foley in the bladder which is empty. Electronically Signed   By: Jasmine PangKim  Fujinaga M.D.   On:  03/13/2023 20:01   CT HEAD WO CONTRAST (5MM)  Result Date: 03/11/2023 CLINICAL DATA:  Mental status change, unknown cause rule out brain bleed given known brain mets and altered mental status EXAM: CT HEAD WITHOUT CONTRAST TECHNIQUE: Contiguous axial images were obtained from the base of the skull through the vertex without intravenous contrast. RADIATION DOSE REDUCTION: This exam was performed according to the departmental dose-optimization program which includes automated exposure control, adjustment of the mA and/or kV according to patient size and/or use of iterative reconstruction technique. COMPARISON:  MRI head February 29, 24. FINDINGS: Brain: Left occipital mass, compatible with known metastasis. No evidence of acute hemorrhage, significant midline shift, hydrocephalus. Patchy white matter hypodensities, nonspecific but compatible with chronic microvascular ischemic disease. Redemonstrated nodular gray matter heterotopia, better characterized on prior MRI. Vascular: Calcific atherosclerosis. Skull: No acute fracture. Sinuses/Orbits: Clear sinuses.  No acute orbital findings. Other: No mastoid effusions. IMPRESSION: 1. Left occipital mass, compatible with known metastasis. MRI with contrast could better characterize. 2. Redemonstrated nodular gray matter heterotopia, better characterized on prior MRI. 3. No evidence of new/interval acute abnormality. Electronically Signed   By: Feliberto HartsFrederick S Jones M.D.   On: 03/11/2023 11:51   CT Angio Chest Pulmonary Embolism (PE) W or WO Contrast  Result Date: 03/09/2023 CLINICAL DATA:  Peripheral edema and cough, initial encounter EXAM: CT ANGIOGRAPHY CHEST WITH CONTRAST TECHNIQUE: Multidetector CT imaging of the chest was performed using the standard protocol during bolus administration of intravenous contrast. Multiplanar CT image reconstructions and MIPs were obtained to evaluate the vascular anatomy. RADIATION DOSE REDUCTION: This exam was performed according to  the departmental dose-optimization program which includes automated exposure control, adjustment of the mA and/or kV according to patient size and/or use of iterative reconstruction technique. CONTRAST:  75mL OMNIPAQUE IOHEXOL 350 MG/ML SOLN COMPARISON:  Chest x-ray from earlier in the same day. FINDINGS: Cardiovascular: Atherosclerotic calcifications of the thoracic aorta are noted. No aneurysmal dilatation is seen. The degree of enhancement of the aorta is limited precluding evaluation for dissection. Heart is enlarged in size. The pulmonary artery shows a normal branching pattern bilaterally. No filling defect to suggest pulmonary embolism is noted. Mediastinum/Nodes: Thoracic inlet is within normal limits. No hilar or mediastinal adenopathy is noted. The esophagus as visualized is within normal limits. Lungs/Pleura: Mild bibasilar atelectatic changes are noted.  No focal infiltrate or sizable effusion is seen. Upper Abdomen: Stable left adrenal adenoma is noted unchanged from previous MRI. No acute abnormality in the upper abdomen is seen. Musculoskeletal: No acute bony abnormality is noted. Degenerative changes of the thoracic spine are seen. Persistent left axillary/lateral breast mass is noted best seen on image 134 measuring up to 2.7 cm. Scattered small axillary lymph nodes are noted stable from the prior exam. Review of the MIP images confirms the above findings. IMPRESSION: Known left breast/left axillary mass similar to that seen on prior exam. This has been previously biopsied No evidence of pulmonary embolism. Stable left adrenal adenoma Aortic Atherosclerosis (ICD10-I70.0). Electronically Signed   By: Alcide Clever M.D.   On: 03/09/2023 19:03   DG Chest Port 1 View  Result Date: 03/09/2023 CLINICAL DATA:  Cough EXAM: PORTABLE CHEST 1 VIEW COMPARISON:  03/06/2023 FINDINGS: Cardiac silhouette is prominent. Aorta is calcified. There is left base consolidation or volume loss. No pneumothorax.  IMPRESSION: Small effusion.  Prominent cardiac silhouette. Electronically Signed   By: Layla Maw M.D.   On: 03/09/2023 13:47   DG Chest 2 View  Result Date: 03/06/2023 CLINICAL DATA:  SOB EXAM: CHEST - 2 VIEW COMPARISON:  04/29/2021. FINDINGS: Cardiac silhouette enlarged. No evidence of pneumothorax or pleural effusion. No evidence of pulmonary edema. Aorta is calcified. There are thoracic degenerative changes. Postop changes right axilla. IMPRESSION: Mildly enlarged cardiac silhouette.  No focal consolidation. Electronically Signed   By: Layla Maw M.D.   On: 03/06/2023 19:22    Microbiology: Results for orders placed or performed during the hospital encounter of 03/09/23  Resp panel by RT-PCR (RSV, Flu A&B, Covid) Anterior Nasal Swab     Status: None   Collection Time: 03/09/23 11:34 AM   Specimen: Anterior Nasal Swab  Result Value Ref Range Status   SARS Coronavirus 2 by RT PCR NEGATIVE NEGATIVE Final   Influenza A by PCR NEGATIVE NEGATIVE Final   Influenza B by PCR NEGATIVE NEGATIVE Final    Comment: (NOTE) The Xpert Xpress SARS-CoV-2/FLU/RSV plus assay is intended as an aid in the diagnosis of influenza from Nasopharyngeal swab specimens and should not be used as a sole basis for treatment. Nasal washings and aspirates are unacceptable for Xpert Xpress SARS-CoV-2/FLU/RSV testing.  Fact Sheet for Patients: BloggerCourse.com  Fact Sheet for Healthcare Providers: SeriousBroker.it  This test is not yet approved or cleared by the [place] FDA and has been authorized for detection and/or diagnosis of SARS-CoV-2 by FDA under an Emergency Use Authorization (EUA). This EUA will remain in effect (meaning this test can be used) for the duration of the COVID-19 declaration under Section 564(b)(1) of the Act, 21 U.S.C. section 360bbb-3(b)(1), unless the authorization is terminated or revoked.     Resp Syncytial Virus by  PCR NEGATIVE NEGATIVE Final    Comment: (NOTE) Fact Sheet for Patients: BloggerCourse.com  Fact Sheet for Healthcare Providers: SeriousBroker.it  This test is not yet approved or cleared by the [place] FDA and has been authorized for detection and/or diagnosis of SARS-CoV-2 by FDA under an Emergency Use Authorization (EUA). This EUA will remain in effect (meaning this test can be used) for the duration of the COVID-19 declaration under Section 564(b)(1) of the Act, 21 U.S.C. section 360bbb-3(b)(1), unless the authorization is terminated or revoked.  Performed at [facility], [address], [place], [place] [address]     Labs: CBC: Recent Labs  Lab 03/19/23 [phone] 03/21/23 0219  WBC  3.9* 4.0  HGB 10.2* 10.4*  HCT 30.4* 30.4*  MCV 87.4 87.6  PLT 74* 83*   Basic Metabolic Panel: Recent Labs  Lab 03/19/23 0643 03/21/23 0219 03/22/23 0157  NA 144 140 142  K 4.0 3.8 3.6  CL 114* 113* 111  CO2 26 24 25   GLUCOSE 139* 143* 133*  BUN 16 19 16   CREATININE 0.57 0.70 0.63  CALCIUM 8.6* 8.6* 8.7*  MG 1.8  --   --   PHOS 2.2*  --   --    Liver Function Tests: Recent Labs  Lab 03/19/23 0643  ALBUMIN 1.8*   CBG: Recent Labs  Lab 03/24/23 0726 03/24/23 1208 03/24/23 1720 03/24/23 2110 03/25/23 0823  GLUCAP 100* 159* 169* 134* 97    Discharge time spent: greater than 30 minutes.  Signed: Coralie Keens, MD [facility] 03/25/2023

## 2023-03-26 DIAGNOSIS — C50919 Malignant neoplasm of unspecified site of unspecified female breast: Secondary | ICD-10-CM | POA: Diagnosis not present

## 2023-03-26 DIAGNOSIS — I10 Essential (primary) hypertension: Secondary | ICD-10-CM | POA: Diagnosis not present

## 2023-03-26 DIAGNOSIS — I480 Paroxysmal atrial fibrillation: Secondary | ICD-10-CM | POA: Diagnosis not present

## 2023-03-26 DIAGNOSIS — E46 Unspecified protein-calorie malnutrition: Secondary | ICD-10-CM | POA: Diagnosis not present

## 2023-03-27 ENCOUNTER — Telehealth: Payer: Medicare PPO | Admitting: Hematology and Oncology

## 2023-03-27 DIAGNOSIS — Z978 Presence of other specified devices: Secondary | ICD-10-CM | POA: Diagnosis not present

## 2023-03-27 DIAGNOSIS — R339 Retention of urine, unspecified: Secondary | ICD-10-CM | POA: Diagnosis not present

## 2023-03-28 DIAGNOSIS — L89153 Pressure ulcer of sacral region, stage 3: Secondary | ICD-10-CM | POA: Diagnosis not present

## 2023-03-28 DIAGNOSIS — L89616 Pressure-induced deep tissue damage of right heel: Secondary | ICD-10-CM | POA: Diagnosis not present

## 2023-03-28 DIAGNOSIS — L89626 Pressure-induced deep tissue damage of left heel: Secondary | ICD-10-CM | POA: Diagnosis not present

## 2023-03-28 DIAGNOSIS — D519 Vitamin B12 deficiency anemia, unspecified: Secondary | ICD-10-CM | POA: Diagnosis not present

## 2023-03-29 DIAGNOSIS — Z978 Presence of other specified devices: Secondary | ICD-10-CM | POA: Diagnosis not present

## 2023-03-29 DIAGNOSIS — I480 Paroxysmal atrial fibrillation: Secondary | ICD-10-CM | POA: Diagnosis not present

## 2023-03-29 DIAGNOSIS — R339 Retention of urine, unspecified: Secondary | ICD-10-CM | POA: Diagnosis not present

## 2023-03-29 DIAGNOSIS — E46 Unspecified protein-calorie malnutrition: Secondary | ICD-10-CM | POA: Diagnosis not present

## 2023-03-29 DIAGNOSIS — I10 Essential (primary) hypertension: Secondary | ICD-10-CM | POA: Diagnosis not present

## 2023-03-29 DIAGNOSIS — C50919 Malignant neoplasm of unspecified site of unspecified female breast: Secondary | ICD-10-CM | POA: Diagnosis not present

## 2023-03-30 ENCOUNTER — Emergency Department (HOSPITAL_COMMUNITY): Payer: Medicare PPO

## 2023-03-30 ENCOUNTER — Other Ambulatory Visit: Payer: Self-pay

## 2023-03-30 ENCOUNTER — Encounter (HOSPITAL_COMMUNITY): Payer: Self-pay | Admitting: Emergency Medicine

## 2023-03-30 ENCOUNTER — Inpatient Hospital Stay (HOSPITAL_COMMUNITY)
Admission: EM | Admit: 2023-03-30 | Discharge: 2023-04-02 | DRG: 193 | Disposition: A | Discharge status: Home Health | Payer: Medicare PPO | Source: Skilled Nursing Facility | Attending: Internal Medicine | Admitting: Internal Medicine

## 2023-03-30 DIAGNOSIS — I110 Hypertensive heart disease with heart failure: Secondary | ICD-10-CM | POA: Diagnosis present

## 2023-03-30 DIAGNOSIS — Z1152 Encounter for screening for COVID-19: Secondary | ICD-10-CM | POA: Diagnosis not present

## 2023-03-30 DIAGNOSIS — Z7189 Other specified counseling: Secondary | ICD-10-CM | POA: Diagnosis not present

## 2023-03-30 DIAGNOSIS — G928 Other toxic encephalopathy: Secondary | ICD-10-CM | POA: Diagnosis not present

## 2023-03-30 DIAGNOSIS — R4182 Altered mental status, unspecified: Principal | ICD-10-CM

## 2023-03-30 DIAGNOSIS — D696 Thrombocytopenia, unspecified: Secondary | ICD-10-CM | POA: Diagnosis present

## 2023-03-30 DIAGNOSIS — L89312 Pressure ulcer of right buttock, stage 2: Secondary | ICD-10-CM | POA: Diagnosis present

## 2023-03-30 DIAGNOSIS — C7931 Secondary malignant neoplasm of brain: Secondary | ICD-10-CM | POA: Diagnosis present

## 2023-03-30 DIAGNOSIS — R5383 Other fatigue: Secondary | ICD-10-CM | POA: Diagnosis not present

## 2023-03-30 DIAGNOSIS — E785 Hyperlipidemia, unspecified: Secondary | ICD-10-CM | POA: Diagnosis present

## 2023-03-30 DIAGNOSIS — R4189 Other symptoms and signs involving cognitive functions and awareness: Secondary | ICD-10-CM | POA: Diagnosis present

## 2023-03-30 DIAGNOSIS — Z8673 Personal history of transient ischemic attack (TIA), and cerebral infarction without residual deficits: Secondary | ICD-10-CM

## 2023-03-30 DIAGNOSIS — R202 Paresthesia of skin: Secondary | ICD-10-CM | POA: Diagnosis present

## 2023-03-30 DIAGNOSIS — R531 Weakness: Secondary | ICD-10-CM | POA: Diagnosis not present

## 2023-03-30 DIAGNOSIS — Z789 Other specified health status: Secondary | ICD-10-CM

## 2023-03-30 DIAGNOSIS — I43 Cardiomyopathy in diseases classified elsewhere: Secondary | ICD-10-CM

## 2023-03-30 DIAGNOSIS — R410 Disorientation, unspecified: Secondary | ICD-10-CM | POA: Diagnosis not present

## 2023-03-30 DIAGNOSIS — N390 Urinary tract infection, site not specified: Secondary | ICD-10-CM | POA: Diagnosis present

## 2023-03-30 DIAGNOSIS — G9340 Encephalopathy, unspecified: Secondary | ICD-10-CM | POA: Diagnosis present

## 2023-03-30 DIAGNOSIS — Z888 Allergy status to other drugs, medicaments and biological substances status: Secondary | ICD-10-CM | POA: Diagnosis not present

## 2023-03-30 DIAGNOSIS — M48062 Spinal stenosis, lumbar region with neurogenic claudication: Secondary | ICD-10-CM | POA: Diagnosis present

## 2023-03-30 DIAGNOSIS — Z515 Encounter for palliative care: Secondary | ICD-10-CM

## 2023-03-30 DIAGNOSIS — R918 Other nonspecific abnormal finding of lung field: Secondary | ICD-10-CM | POA: Diagnosis not present

## 2023-03-30 DIAGNOSIS — I5032 Chronic diastolic (congestive) heart failure: Secondary | ICD-10-CM | POA: Diagnosis present

## 2023-03-30 DIAGNOSIS — I959 Hypotension, unspecified: Secondary | ICD-10-CM | POA: Diagnosis not present

## 2023-03-30 DIAGNOSIS — Z9013 Acquired absence of bilateral breasts and nipples: Secondary | ICD-10-CM

## 2023-03-30 DIAGNOSIS — E119 Type 2 diabetes mellitus without complications: Secondary | ICD-10-CM

## 2023-03-30 DIAGNOSIS — I480 Paroxysmal atrial fibrillation: Secondary | ICD-10-CM | POA: Diagnosis present

## 2023-03-30 DIAGNOSIS — Z800 Family history of malignant neoplasm of digestive organs: Secondary | ICD-10-CM

## 2023-03-30 DIAGNOSIS — Z711 Person with feared health complaint in whom no diagnosis is made: Secondary | ICD-10-CM | POA: Diagnosis not present

## 2023-03-30 DIAGNOSIS — M170 Bilateral primary osteoarthritis of knee: Secondary | ICD-10-CM | POA: Diagnosis not present

## 2023-03-30 DIAGNOSIS — E854 Organ-limited amyloidosis: Secondary | ICD-10-CM

## 2023-03-30 DIAGNOSIS — M6281 Muscle weakness (generalized): Secondary | ICD-10-CM | POA: Diagnosis not present

## 2023-03-30 DIAGNOSIS — G936 Cerebral edema: Secondary | ICD-10-CM | POA: Diagnosis not present

## 2023-03-30 DIAGNOSIS — R9431 Abnormal electrocardiogram [ECG] [EKG]: Secondary | ICD-10-CM | POA: Diagnosis not present

## 2023-03-30 DIAGNOSIS — J9811 Atelectasis: Secondary | ICD-10-CM | POA: Diagnosis not present

## 2023-03-30 DIAGNOSIS — J189 Pneumonia, unspecified organism: Secondary | ICD-10-CM | POA: Diagnosis present

## 2023-03-30 DIAGNOSIS — I10 Essential (primary) hypertension: Secondary | ICD-10-CM

## 2023-03-30 DIAGNOSIS — Z8042 Family history of malignant neoplasm of prostate: Secondary | ICD-10-CM

## 2023-03-30 DIAGNOSIS — Z853 Personal history of malignant neoplasm of breast: Secondary | ICD-10-CM

## 2023-03-30 DIAGNOSIS — Z79899 Other long term (current) drug therapy: Secondary | ICD-10-CM

## 2023-03-30 DIAGNOSIS — Z7401 Bed confinement status: Secondary | ICD-10-CM | POA: Diagnosis not present

## 2023-03-30 LAB — RESP PANEL BY RT-PCR (RSV, FLU A&B, COVID)  RVPGX2
Influenza A by PCR: NEGATIVE
Influenza B by PCR: NEGATIVE
Resp Syncytial Virus by PCR: NEGATIVE
SARS Coronavirus 2 by RT PCR: NEGATIVE

## 2023-03-30 LAB — CBC WITH DIFFERENTIAL/PLATELET
Abs Immature Granulocytes: 0.45 10*3/uL — ABNORMAL HIGH (ref 0.00–0.07)
Basophils Absolute: 0.1 10*3/uL (ref 0.0–0.1)
Basophils Relative: 1 %
Eosinophils Absolute: 0.2 10*3/uL (ref 0.0–0.5)
Eosinophils Relative: 2 %
HCT: 33.7 % — ABNORMAL LOW (ref 36.0–46.0)
Hemoglobin: 11 g/dL — ABNORMAL LOW (ref 12.0–15.0)
Immature Granulocytes: 5 %
Lymphocytes Relative: 17 %
Lymphs Abs: 1.6 10*3/uL (ref 0.7–4.0)
MCH: 29 pg (ref 26.0–34.0)
MCHC: 32.6 g/dL (ref 30.0–36.0)
MCV: 88.9 fL (ref 80.0–100.0)
Monocytes Absolute: 0.9 10*3/uL (ref 0.1–1.0)
Monocytes Relative: 10 %
Neutro Abs: 6.1 10*3/uL (ref 1.7–7.7)
Neutrophils Relative %: 65 %
Platelets: 273 10*3/uL (ref 150–400)
RBC: 3.79 MIL/uL — ABNORMAL LOW (ref 3.87–5.11)
RDW: 18.5 % — ABNORMAL HIGH (ref 11.5–15.5)
WBC: 9.3 10*3/uL (ref 4.0–10.5)
nRBC: 0 % (ref 0.0–0.2)

## 2023-03-30 LAB — URINALYSIS, W/ REFLEX TO CULTURE (INFECTION SUSPECTED)
Bilirubin Urine: NEGATIVE
Glucose, UA: NEGATIVE mg/dL
Hgb urine dipstick: NEGATIVE
Ketones, ur: NEGATIVE mg/dL
Nitrite: NEGATIVE
Protein, ur: NEGATIVE mg/dL
Specific Gravity, Urine: 1.005 (ref 1.005–1.030)
WBC, UA: >50 WBC/hpf (ref 0–5)
pH: 7 (ref 5.0–8.0)

## 2023-03-30 LAB — COMPREHENSIVE METABOLIC PANEL
ALT: 19 U/L (ref 0–44)
AST: 16 U/L (ref 15–41)
Albumin: 2 g/dL — ABNORMAL LOW (ref 3.5–5.0)
Alkaline Phosphatase: 76 U/L (ref 38–126)
Anion gap: 6 (ref 5–15)
BUN: 8 mg/dL (ref 8–23)
CO2: 28 mmol/L (ref 22–32)
Calcium: 9.5 mg/dL (ref 8.9–10.3)
Chloride: 103 mmol/L (ref 98–111)
Creatinine, Ser: 0.68 mg/dL (ref 0.44–1.00)
GFR, Estimated: >60 mL/min (ref >60)
Glucose, Bld: 135 mg/dL — ABNORMAL HIGH (ref 70–99)
Potassium: 3.5 mmol/L (ref 3.5–5.1)
Sodium: 137 mmol/L (ref 135–145)
Total Bilirubin: 0.9 mg/dL (ref 0.3–1.2)
Total Protein: 4.9 g/dL — ABNORMAL LOW (ref 6.5–8.1)

## 2023-03-30 LAB — MAGNESIUM: Magnesium: 1.9 mg/dL (ref 1.7–2.4)

## 2023-03-30 LAB — PROCALCITONIN: Procalcitonin: <0.1 ng/mL

## 2023-03-30 LAB — PROTIME-INR
INR: 1.3 — ABNORMAL HIGH (ref 0.8–1.2)
Prothrombin Time: 16.1 seconds — ABNORMAL HIGH (ref 11.4–15.2)

## 2023-03-30 LAB — LACTIC ACID, PLASMA: Lactic Acid, Venous: 1 mmol/L (ref 0.5–1.9)

## 2023-03-30 LAB — GLUCOSE, CAPILLARY: Glucose-Capillary: 78 mg/dL (ref 70–99)

## 2023-03-30 LAB — URINE CULTURE

## 2023-03-30 LAB — BRAIN NATRIURETIC PEPTIDE: B Natriuretic Peptide: 229.3 pg/mL — ABNORMAL HIGH (ref 0.0–100.0)

## 2023-03-30 LAB — APTT: aPTT: 25 seconds (ref 24–36)

## 2023-03-30 MED ORDER — CEFEPIME HCL 1 G IJ SOLR
1.0000 g | Freq: Once | INTRAMUSCULAR | Status: DC
Start: 2023-03-30 — End: 2023-03-30

## 2023-03-30 MED ORDER — MEMANTINE HCL 10 MG PO TABS
10.0000 mg | ORAL_TABLET | Freq: Two times a day (BID) | ORAL | Status: DC
  Administered 2023-03-30 – 2023-04-02 (×6): 10 mg via ORAL
  Filled 2023-03-30 (×6): qty 1

## 2023-03-30 MED ORDER — TAFAMIDIS 61 MG PO CAPS
61.0000 mg | ORAL_CAPSULE | Freq: Every day | ORAL | Status: DC
  Administered 2023-04-01 – 2023-04-02 (×2): 61 mg via ORAL
  Filled 2023-03-30 (×2): qty 1

## 2023-03-30 MED ORDER — METOPROLOL TARTRATE 50 MG PO TABS
50.0000 mg | ORAL_TABLET | Freq: Two times a day (BID) | ORAL | Status: DC
  Administered 2023-03-30 – 2023-04-02 (×6): 50 mg via ORAL
  Filled 2023-03-30 (×6): qty 1

## 2023-03-30 MED ORDER — VANCOMYCIN HCL IN DEXTROSE 1-5 GM/200ML-% IV SOLN
1000.0000 mg | INTRAVENOUS | Status: DC
  Administered 2023-03-31 – 2023-04-01 (×2): 1000 mg via INTRAVENOUS
  Filled 2023-03-30 (×2): qty 200

## 2023-03-30 MED ORDER — SODIUM CHLORIDE 0.9 % IV SOLN
2.0000 g | Freq: Two times a day (BID) | INTRAVENOUS | Status: DC
  Administered 2023-03-30 – 2023-04-02 (×6): 2 g via INTRAVENOUS
  Filled 2023-03-30 (×6): qty 12.5

## 2023-03-30 MED ORDER — INSULIN ASPART 100 UNIT/ML IJ SOLN
0.0000 [IU] | Freq: Three times a day (TID) | INTRAMUSCULAR | Status: DC
  Administered 2023-03-31: 5 [IU] via SUBCUTANEOUS
  Administered 2023-03-31: 1 [IU] via SUBCUTANEOUS
  Administered 2023-03-31: 2 [IU] via SUBCUTANEOUS
  Administered 2023-04-01 (×2): 3 [IU] via SUBCUTANEOUS
  Administered 2023-04-01: 5 [IU] via SUBCUTANEOUS
  Administered 2023-04-02: 2 [IU] via SUBCUTANEOUS

## 2023-03-30 MED ORDER — LOSARTAN POTASSIUM 50 MG PO TABS
25.0000 mg | ORAL_TABLET | Freq: Every day | ORAL | Status: DC
  Administered 2023-03-31 – 2023-04-02 (×3): 25 mg via ORAL
  Filled 2023-03-30 (×3): qty 1

## 2023-03-30 MED ORDER — ACETAMINOPHEN 650 MG RE SUPP: 650.0000 mg | Freq: Four times a day (QID) | RECTAL | Status: DC | PRN

## 2023-03-30 MED ORDER — VANCOMYCIN HCL 1500 MG/300ML IV SOLN
1500.0000 mg | Freq: Once | INTRAVENOUS | Status: AC
  Administered 2023-03-30: 1500 mg via INTRAVENOUS
  Filled 2023-03-30: qty 300

## 2023-03-30 MED ORDER — ACETAMINOPHEN 325 MG PO TABS
650.0000 mg | ORAL_TABLET | Freq: Four times a day (QID) | ORAL | Status: DC | PRN
  Administered 2023-03-31: 650 mg via ORAL
  Filled 2023-03-30: qty 2

## 2023-03-30 MED ORDER — ENOXAPARIN SODIUM 40 MG/0.4ML IJ SOSY
40.0000 mg | PREFILLED_SYRINGE | INTRAMUSCULAR | Status: DC
  Administered 2023-03-30 – 2023-04-01 (×3): 40 mg via SUBCUTANEOUS
  Filled 2023-03-30 (×3): qty 0.4

## 2023-03-30 MED ORDER — CHLORHEXIDINE GLUCONATE CLOTH 2 % EX PADS
6.0000 | MEDICATED_PAD | Freq: Every day | CUTANEOUS | Status: DC
  Administered 2023-04-01 – 2023-04-02 (×2): 6 via TOPICAL

## 2023-03-30 MED ORDER — SPIRONOLACTONE 12.5 MG HALF TABLET
12.5000 mg | ORAL_TABLET | Freq: Every day | ORAL | Status: DC
  Administered 2023-03-31 – 2023-04-02 (×3): 12.5 mg via ORAL
  Filled 2023-03-30 (×3): qty 1

## 2023-03-30 MED ORDER — SODIUM CHLORIDE 0.9% FLUSH
3.0000 mL | Freq: Two times a day (BID) | INTRAVENOUS | Status: DC
  Administered 2023-03-30 – 2023-04-02 (×5): 3 mL via INTRAVENOUS

## 2023-03-30 MED ORDER — POLYETHYLENE GLYCOL 3350 17 G PO PACK: 17.0000 g | PACK | Freq: Every day | ORAL | Status: DC | PRN

## 2023-03-30 MED ORDER — ATORVASTATIN CALCIUM 80 MG PO TABS
80.0000 mg | ORAL_TABLET | Freq: Every day | ORAL | Status: DC
  Administered 2023-03-31 – 2023-04-02 (×3): 80 mg via ORAL
  Filled 2023-03-30 (×3): qty 1

## 2023-03-30 MED ORDER — LACTATED RINGERS IV BOLUS
1000.0000 mL | Freq: Once | INTRAVENOUS | Status: AC
  Administered 2023-03-30: 1000 mL via INTRAVENOUS

## 2023-03-30 MED ORDER — FUROSEMIDE 20 MG PO TABS
20.0000 mg | ORAL_TABLET | Freq: Every day | ORAL | Status: DC
  Administered 2023-03-31 – 2023-04-02 (×3): 20 mg via ORAL
  Filled 2023-03-30 (×3): qty 1

## 2023-03-30 MED ORDER — AMIODARONE HCL 200 MG PO TABS
200.0000 mg | ORAL_TABLET | Freq: Every day | ORAL | Status: DC
  Administered 2023-03-31 – 2023-04-02 (×3): 200 mg via ORAL
  Filled 2023-03-30 (×3): qty 1

## 2023-03-30 NOTE — ED Notes (Signed)
Got patient undressed into a gown I did EKG shown to er provider did rectal temp it was 99.0 patient is resting with family at bedside

## 2023-03-30 NOTE — ED Triage Notes (Signed)
Pt to ER via EMS from [facility] with reports of increased lethargy this AM.  Pt denies pain or c/o at this time.  Pt has indwelling urinary catheter.

## 2023-03-30 NOTE — H&P (Signed)
History and Physical   Amanda Green ZOX:[id number] DOB: 17-Apr-1935 DOA: 03/30/2023  PCP: [name], DO   Patient coming from: [facility]  Chief Complaint: Altered mentation, lethargy  HPI: Amanda Green is a 88 y.o. female with medical history significant of stroke, A-fib, hypertension, diabetes, spinal stenosis, cognitive impairment, cardiac amyloid and diastolic CHF, breast cancer with mets to the brain now with recent decline presenting with increasing lethargy and altered mental status.  History obtained with assistance of family and chart review.  Patient has had 2 to 3 days of increased lethargy.  Patient was worsened today when daughter was unable to wake her this morning.  EMS was called to transport patient to the ED.  Patient recently admitted in the past month for A-fib with RVR and diastolic CHF exacerbation.  Noted to have generalized decline in the setting of current breast cancer with mets to the brain.  This was discussed in recent oncology note on 4/8 and at that time the discussion noted that if patient continues to decline in the next couple weeks plan would be to transition her to hospice care. Remains full code as that was patient's wishes during previous hospitalization, will reassess pending response to current treatments.  Unable to obtain full review of systems due to patient's altered mental status, drowsy but arousable and oriented to name and "hospital "  ED Course: Vital signs in the ED notable for blood pressure in the 130s to 140s systolic.  Lab workup included CMP with glucose 135, protein 4.9, albumin 2.0.  CBC with hemoglobin stable 11.  PT and INR mildly elevated at 16.7 and 1.3 respectively.  Lactic acid normal.  BNP mildly elevated at 229 but down from recent admission.  Urinalysis showed leukocytosis.  Urine culture and blood cultures pending.  MRSA screen pending.  Chest x-ray with new bibasilar opacities and probable small left  pleural effusion suspicious for multifocal pneumonia.  Patient started on vancomycin and cefepime in the ED and she additionally was ordered 1 L of IV fluids.  Review of Systems: Able to obtain full review of systems due to patient's altered mentation.  Does report feeling "okay "  Past Medical History:  Diagnosis Date   Arthritis    knees,   Breast cancer 1999   right mastectomy   Breast cancer, left 05/2019   left lumpectomy   Cerebrovascular accident (CVA) 02/07/2021   Diabetes mellitus    Dislocation of left shoulder joint    Family history of prostate cancer    Hyperlipidemia    Hypertension    Memory loss    Numbness and tingling of right lower extremity    Paresthesia    Stroke    light stroke - 2012 ,right leg nerve damage     Past Surgical History:  Procedure Laterality Date   ABDOMINAL HYSTERECTOMY     APPENDECTOMY     BREAST BIOPSY Left 01/19/2010   BREAST LUMPECTOMY WITH RADIOACTIVE SEED LOCALIZATION Left 07/17/2019   Procedure: LEFT BREAST LUMPECTOMY WITH RADIOACTIVE SEED LOCALIZATION;  Surgeon: Luretha Murphy, MD;  Location: [facility];  Service: General;  Laterality: Left;   BREAST SURGERY     Right mastectomy 1999   CARPAL TUNNEL RELEASE Right 09/30/2014   Procedure: RIGHT CARPAL TUNNEL RELEASE;  Surgeon: Cindee Salt, MD;  Location: [facility];  Service: Orthopedics;  Laterality: Right;   COLONOSCOPY     EYE SURGERY     cataracts   JOINT REPLACEMENT  lt total knee 2011   LIPOMA EXCISION  11/28/2011   Procedure: EXCISION LIPOMA;  Surgeon: Valarie Merino, MD;  Location: [facility];  Service: General;  Laterality: N/A;  excisino lipoma 4 cm back of neck   MASTECTOMY Right 1999   TOTAL MASTECTOMY Left 05/10/2021   Procedure: LEFT TOTAL MASTECTOMY;  Surgeon: Luretha Murphy, MD;  Location: [facility];  Service: General;  Laterality: Left;    Social History  reports that she has never smoked. She has never used  smokeless tobacco. She reports that she does not drink alcohol and does not use drugs.  Allergies  Allergen Reactions   Gabapentin     Drowsiness    Family History  Problem Relation Age of Onset   Esophageal cancer Mother        smoker   Other Father        Unsure of medical history.   Prostate cancer Brother        d. 25s   Cancer Son        ?? unsure, but may have had cancer   Cancer Son        ?? unsure, but may have had cancer  Reviewed on admission  Prior to Admission medications   Medication Sig Start Date End Date Taking? Authorizing Provider  amiodarone (PACERONE) 200 MG tablet Take 1 tablet (200 mg total) by mouth daily. 03/26/23 04/25/23  Arrien, York Ram, MD  atorvastatin (LIPITOR) 80 MG tablet Take 80 mg by mouth daily.    [provider]  brimonidine-timolol (COMBIGAN) 0.2-0.5 % ophthalmic solution Place 1 drop into the left eye every 12 (twelve) hours.    [provider]  calcium carbonate (OSCAL) 1500 (600 Ca) MG TABS tablet Take 1,500 mg by mouth daily with breakfast.    [provider]  dexamethasone (DECADRON) 0.5 MG tablet Take 1 tablet (0.5 mg total) by mouth every other day for 4 days. 03/26/23 03/30/23  Arrien, York Ram, MD  dorzolamide (TRUSOPT) 2 % ophthalmic solution Place 1 drop into the left eye 2 (two) times daily. 10/19/20   [provider]  furosemide (LASIX) 20 MG tablet Take 1 tablet (20 mg total) by mouth daily. 03/26/23 04/25/23  Arrien, York Ram, MD  losartan (COZAAR) 25 MG tablet Take 1 tablet (25 mg total) by mouth daily. 03/26/23 04/25/23  Arrien, York Ram, MD  magnesium 30 MG tablet Take 30 mg by mouth daily.    [provider]  memantine (NAMENDA) 10 MG tablet Take 1 tablet (10 mg total) by mouth 2 (two) times daily. 02/04/23   Ihor Austin, NP  metoprolol tartrate (LOPRESSOR) 50 MG tablet Take 1 tablet (50 mg total) by mouth 2 (two) times daily. 03/25/23 04/24/23  Arrien, York Ram,  MD  ofloxacin (OCUFLOX) 0.3 % ophthalmic solution Place 1 drop into the right eye 4 (four) times daily. 02/06/23   [provider]  prednisoLONE acetate (PRED FORTE) 1 % ophthalmic suspension Place 1 drop into the right eye 4 (four) times daily. 07/18/22   [provider]  ROCKLATAN 0.02-0.005 % SOLN Place 1 drop into the left eye at bedtime. 01/31/23   [provider]  spironolactone (ALDACTONE) 25 MG tablet Take 0.5 tablets (12.5 mg total) by mouth daily. 03/26/23 04/25/23  Arrien, York Ram, MD  Tafamidis (VYNDAMAX) 61 MG CAPS Take 61 mg by mouth daily. 04/27/22   Croitoru, Mihai, MD  vitamin B-12 (CYANOCOBALAMIN) 1000 MCG tablet Take 1,000 mcg by mouth  daily.    [provider]  vitamin C (ASCORBIC ACID) 500 MG tablet Take 500 mg by mouth daily.    [provider]    Physical Exam: Vitals:   03/30/23 0949  BP: (!) 144/63  Pulse: 84  Resp: (!) 22  Temp: 99 F (37.2 C)  TempSrc: Rectal  SpO2: 100%  Weight: 72 kg  Height: 5\' 3"  (1.6 m)    Physical Exam Constitutional:      General: She is not in acute distress.    Appearance: Normal appearance.  HENT:     Head: Normocephalic and atraumatic.     Mouth/Throat:     Mouth: Mucous membranes are moist.     Pharynx: Oropharynx is clear.  Eyes:     Extraocular Movements: Extraocular movements intact.     Pupils: Pupils are equal, round, and reactive to light.  Cardiovascular:     Rate and Rhythm: Normal rate and regular rhythm.     Pulses: Normal pulses.     Heart sounds: Normal heart sounds.  Pulmonary:     Effort: Pulmonary effort is normal. No respiratory distress.     Breath sounds: Normal breath sounds.  Abdominal:     General: Bowel sounds are normal. There is no distension.     Palpations: Abdomen is soft.     Tenderness: There is no abdominal tenderness.  Musculoskeletal:        General: No swelling or deformity.  Skin:    General: Skin is warm and dry.  Neurological:      Comments: Lethargic and drowsy, arousable, oriented to person and "hospital "only     Labs on Admission: I have personally reviewed following labs and imaging studies  CBC: Recent Labs  Lab 03/30/23 1011  WBC 9.3  NEUTROABS 6.1  HGB 11.0*  HCT 33.7*  MCV 88.9  PLT 273    Basic Metabolic Panel: Recent Labs  Lab 03/30/23 1011  NA 137  K 3.5  CL 103  CO2 28  GLUCOSE 135*  BUN 8  CREATININE 0.68  CALCIUM 9.5    GFR: Estimated Creatinine Clearance: 48 mL/min (by C-G formula based on SCr of 0.68 mg/dL).  Liver Function Tests: Recent Labs  Lab 03/30/23 1011  AST 16  ALT 19  ALKPHOS 76  BILITOT 0.9  PROT 4.9*  ALBUMIN 2.0*    Urine analysis:    Component Value Date/Time   COLORURINE YELLOW 03/30/2023 1011   APPEARANCEUR HAZY (A) 03/30/2023 1011   LABSPEC 1.005 03/30/2023 1011   PHURINE 7.0 03/30/2023 1011   GLUCOSEU NEGATIVE 03/30/2023 1011   HGBUR NEGATIVE 03/30/2023 1011   BILIRUBINUR NEGATIVE 03/30/2023 1011   KETONESUR NEGATIVE 03/30/2023 1011   PROTEINUR NEGATIVE 03/30/2023 1011   UROBILINOGEN 0.2 03/23/2010 1156   NITRITE NEGATIVE 03/30/2023 1011   LEUKOCYTESUR LARGE (A) 03/30/2023 1011    Radiological Exams on Admission: CT Head Wo Contrast  Result Date: 03/30/2023 CLINICAL DATA:  Mental status change.  Known CNS neoplasm EXAM: CT HEAD WITHOUT CONTRAST TECHNIQUE: Contiguous axial images were obtained from the base of the skull through the vertex without intravenous contrast. RADIATION DOSE REDUCTION: This exam was performed according to the departmental dose-optimization program which includes automated exposure control, adjustment of the mA and/or kV according to patient size and/or use of iterative reconstruction technique. COMPARISON:  03/11/2023, 02/14/2023 FINDINGS: Brain: Known left occipital lobe mass measuring approximately 3.0 cm with surrounding vasogenic edema. Approximately 4 mm of left-to-right midline shift is unchanged. Small nodule  along the posterior right lateral ventricle is unchanged from prior and favored to represent a focus of gray matter heterotopia (series 3, image 19). No definite new mass lesion identified. No large territory acute infarction. No intracranial hemorrhage or extra-axial collection. No hydrocephalus. Vascular: Atherosclerotic calcifications involving the large vessels of the skull base. No unexpected hyperdense vessel. Skull: Normal. Negative for fracture or focal lesion. Sinuses/Orbits: No acute finding. Other: None. IMPRESSION: 1. Known left occipital lobe mass with surrounding vasogenic edema, similar in appearance to prior. 4 mm of left-to-right midline shift, unchanged from prior. 2. 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 Electronically Signed   By: Duanne Guess D.O.   On: 03/30/2023 10:41   DG Chest Port 1 View  Result Date: 03/30/2023 CLINICAL DATA:  Possible sepsis. EXAM: PORTABLE CHEST 1 VIEW COMPARISON:  Chest x-rays dated 03/10/2023 03/06/2023. FINDINGS: New bibasilar airspace opacities. Probable small LEFT pleural effusion. No pneumothorax is seen. Heart size and mediastinal contours appear stable. Osseous structures about the chest are unremarkable. IMPRESSION: 1. New bibasilar airspace opacities, suspicious for multifocal pneumonia, alternatively asymmetric edema. 2. Probable small LEFT pleural effusion. Electronically Signed   By: Bary Richard M.D.   On: 03/30/2023 10:19    EKG: Independently reviewed.  Sinus rhythm at 79 bpm.  Low voltage multiple leads.  Nonspecific T wave flattening.  Assessment/Plan Principal Problem:   Acute encephalopathy Active Problems:   Paroxysmal atrial fibrillation   Cardiac amyloidosis   Controlled type 2 diabetes mellitus without complication, without long-term current use of insulin   Essential hypertension   Paresthesia of right lower extremity    History of CVA (cerebrovascular accident)   Low back pain without sciatica   Spinal stenosis of lumbar region with neurogenic claudication   Acute cephalopathy Pneumonia > Patient with several days of lethargy and was not arousable this morning by daughter.  Chest x-ray showed evidence of new multifocal pneumonia.  No leukocytosis. > Increased lethargy and altered mental status likely multifactorial.  Do believe this new pneumonia is playing a role will continue to treat with broad-spectrum antibiotics while working up for viral versus bacterial etiology considering recent admission and residing at [facility] care. > Her generalized decline in the setting of breast cancer with mets to the brain is likely playing a role/decreasing her reserve. - Monitor on telemetry - Continuous vancomycin and cefepime - Trend fever curve and WBC - Check flu COVID RSV screen, if negative add on full respiratory viral panel - Trend procalcitonin - Trend fever curve and WBC - Supportive care  Breast cancer Metastasis to brain > Patient with known breast cancer with mets to the brain.  Recently admitted for A-fib with CHF and followed up with oncology noting generalized decline which is become more rapid.  They discussed on 4/8 plan to transition to hospice if patient continues to decline in the 2 weeks following that visit. > Discussed with family at bedside, at this time patient remain full code as this was her wishes during recent hospitalization.  Will continue to reassess pending response to current treatment and status during admission. > Palliative care saw the patient during recent admission and they are open to palliative care seeing them again.  Discussed that even if she recovers from pneumonia she may have declined just from being in the hospital and will have to continue to reassess her status and fer likelihood of recovery.  Discussed it would be advantageous to have palliative care continue to  follow in the hospital and out of the hospital prior to any transition to hospice care to make that transition easier if needed and add additional resources in the meantime. - Supportive care - Palliative care consult  Cardiac amyloid Diastolic CHF > Patient had diastolic CHF history of extubation during recent admission for A-fib.  BNP mildly elevated at this time at 229 this is improved from previous. > On exam, does not appear volume overloaded. > Last echo during last admission showed EF 60-65%, indeterminate diastolic function, normal RV function. - Continue home Lasix, spironolactone, losartan, metoprolol - Continue home tafamidis  History of CVA - Continue home atorvastatin  Atrial fibrillation - Continue home amiodarone, metoprolol - Not on anticoagulation due to risk of bleeding  Diabetes - SSI  Cognitive impairment - Continue home Namenda  DVT prophylaxis: Lovenox Code Status:   Full, based on patient's previous wishes, ongoing conversation, discussed with daughter. Family Communication:  Daughter updated at bedside Disposition Plan:   Patient is from:  [facility]  Anticipated DC to:  SNF versus hospice  Anticipated DC date:  Pending clinical course  Anticipated DC barriers: Possible need for hospice if significantly declines Consults called:  Palliative care consult ordered Admission status:  Inpatient, telemetry  Severity of Illness: The appropriate patient status for this patient is INPATIENT. Inpatient status is judged to be reasonable and necessary in order to provide the required intensity of service to ensure the patient's safety. The patient's presenting symptoms, physical exam findings, and initial radiographic and laboratory data in the context of their chronic comorbidities is felt to place them at high risk for further clinical deterioration. Furthermore, it is not anticipated that the patient will be medically stable for discharge from the hospital  within 2 midnights of admission.   * I certify that at the point of admission it is my clinical judgment that the patient will require inpatient hospital care spanning beyond 2 midnights from the point of admission due to high intensity of service, high risk for further deterioration and high frequency of surveillance required.Synetta Fail MD [facility]  How to contact the [facility] Attending or Consulting provider 7A - 7P or covering provider during after hours 7P -7A, for this patient?   Check the care team in [facility] and look for a) attending/consulting [facility] provider listed and b) the [facility] team listed Log into [email or web address] and use [place]'s universal password to access. If you do not have the password, please contact the hospital operator. Locate the [facility] provider you are looking for under [facility] and page to a number that you can be directly reached. If you still have difficulty reaching the provider, please page the [facility] (Director on Call) for the Hospitalists listed on amion for assistance.  03/30/2023, 12:26 PM

## 2023-03-30 NOTE — ED Notes (Signed)
ED TO INPATIENT HANDOFF REPORT  ED Nurse Name and Phone #: [place] RN [phone]  Patient has had 2 to 3 days of increased lethargy.  Patient was worsened today when daughter was unable to wake her this morning.  EMS was called to transport patient to the ED. Patient recently admitted in the past month for A-fib with RVR and diastolic CHF exacerbation.  Noted to have generalized decline in the setting of current breast cancer with mets to the brain. Pt is arousable, but drowsy. VSS. Pt on room air.   S Name/Age/Gender Paticia Green 88 y.o. female Room/Bed: 038C/038C  Code Status   Code Status: Full Code  Home/SNF/Other Skilled nursing facility Patient oriented to: self and place Is this baseline? No   Triage Complete: Triage complete  Chief Complaint Acute encephalopathy [G93.40]  Triage Note Pt to ER via EMS from [facility] with reports of increased lethargy this AM.  Pt denies pain or c/o at this time.  Pt has indwelling urinary catheter.   Allergies Allergies  Allergen Reactions   Gabapentin     Drowsiness    Level of Care/Admitting Diagnosis ED Disposition     ED Disposition  Admit   Condition  --   Comment  Hospital Area: [facility] [[id number]]  Level of Care: Telemetry Medical [104]  May admit patient to [facility] or [facility] if equivalent level of care is available:: No  Covid Evaluation: Symptomatic Person Under Investigation (PUI) or recent exposure (last 10 days) *Testing Required*  Diagnosis: Acute encephalopathy [440102]  Admitting Physician: Synetta Fail [[id number]]  Attending Physician: Synetta Fail [phone]  Certification:: I certify this patient will need inpatient services for at least 2 midnights  Estimated Length of Stay: 3          B Medical/Surgery History Past Medical History:  Diagnosis Date   Arthritis    knees,   Breast cancer 1999   right mastectomy   Breast cancer, left 05/2019    left lumpectomy   Cerebrovascular accident (CVA) 02/07/2021   Diabetes mellitus    Dislocation of left shoulder joint    Family history of prostate cancer    Hyperlipidemia    Hypertension    Memory loss    Numbness and tingling of right lower extremity    Paresthesia    Stroke    light stroke - 2012 ,right leg nerve damage    Past Surgical History:  Procedure Laterality Date   ABDOMINAL HYSTERECTOMY     APPENDECTOMY     BREAST BIOPSY Left 01/19/2010   BREAST LUMPECTOMY WITH RADIOACTIVE SEED LOCALIZATION Left 07/17/2019   Procedure: LEFT BREAST LUMPECTOMY WITH RADIOACTIVE SEED LOCALIZATION;  Surgeon: Luretha Murphy, MD;  Location: [facility];  Service: General;  Laterality: Left;   BREAST SURGERY     Right mastectomy 1999   CARPAL TUNNEL RELEASE Right 09/30/2014   Procedure: RIGHT CARPAL TUNNEL RELEASE;  Surgeon: Cindee Salt, MD;  Location: [facility];  Service: Orthopedics;  Laterality: Right;   COLONOSCOPY     EYE SURGERY     cataracts   JOINT REPLACEMENT     lt total knee 2011   LIPOMA EXCISION  11/28/2011   Procedure: EXCISION LIPOMA;  Surgeon: Valarie Merino, MD;  Location: [facility];  Service: General;  Laterality: N/A;  excisino lipoma 4 cm back of neck   MASTECTOMY Right 1999   TOTAL MASTECTOMY Left 05/10/2021   Procedure: LEFT  TOTAL MASTECTOMY;  Surgeon: Luretha Murphy, MD;  Location: [facility];  Service: General;  Laterality: Left;     A IV Location/Drains/Wounds Patient Lines/Drains/Airways Status     Active Line/Drains/Airways     Name Placement date Placement time Site Days   Peripheral IV 03/30/23 20 G Left Antecubital 03/30/23  1014  Antecubital  less than 1   Urethral Catheter Peggye Form, RN Latex 14 Fr. 03/25/23  1752  Latex  5   Pressure Injury 03/09/23 Buttocks Right Stage 2 -  Partial thickness loss of dermis presenting as a shallow open injury with a red, pink wound bed without slough. less than 1cm  size. pink/red 03/09/23  1854  -- 21            Intake/Output Last 24 hours  Intake/Output Summary (Last 24 hours) at 03/30/2023 1526 Last data filed at 03/30/2023 1510 Gross per 24 hour  Intake 298.39 ml  Output --  Net 298.39 ml    Labs/Imaging Results for orders placed or performed during the hospital encounter of 03/30/23 (from the past 48 hour(s))  Lactic acid, plasma     Status: None   Collection Time: 03/30/23 10:11 AM  Result Value Ref Range   Lactic Acid, Venous 1.0 0.5 - 1.9 mmol/L    Comment: Performed at [facility], [address], [place], [place] [address]  Comprehensive metabolic panel     Status: Abnormal   Collection Time: 03/30/23 10:11 AM  Result Value Ref Range   Sodium 137 135 - 145 mmol/L   Potassium 3.5 3.5 - 5.1 mmol/L   Chloride 103 98 - 111 mmol/L   CO2 28 22 - 32 mmol/L   Glucose, Bld 135 (H) 70 - 99 mg/dL    Comment: Glucose reference range applies only to samples taken after fasting for at least 8 hours.   BUN 8 8 - 23 mg/dL   Creatinine, Ser 6.04 0.44 - 1.00 mg/dL   Calcium 9.5 8.9 - 54.0 mg/dL   Total Protein 4.9 (L) 6.5 - 8.1 g/dL   Albumin 2.0 (L) 3.5 - 5.0 g/dL   AST 16 15 - 41 U/L   ALT 19 0 - 44 U/L   Alkaline Phosphatase 76 38 - 126 U/L   Total Bilirubin 0.9 0.3 - 1.2 mg/dL   GFR, Estimated >98 >11 mL/min    Comment: (NOTE) Calculated using the CKD-EPI Creatinine Equation (2021)    Anion gap 6 5 - 15    Comment: Performed at [facility], [address] [place], [place] [address]  CBC with Differential     Status: Abnormal   Collection Time: 03/30/23 10:11 AM  Result Value Ref Range   WBC 9.3 4.0 - 10.5 K/uL   RBC 3.79 (L) 3.87 - 5.11 MIL/uL   Hemoglobin 11.0 (L) 12.0 - 15.0 g/dL   HCT 29.5 (L) 62.1 - 30.8 %   MCV 88.9 80.0 - 100.0 fL   MCH 29.0 26.0 - 34.0 pg   MCHC 32.6 30.0 - 36.0 g/dL   RDW 65.7 (H) 84.6 - 96.2 %   Platelets 273 150 - 400 K/uL   nRBC 0.0 0.0 - 0.2 %   Neutrophils Relative % 65 %    Neutro Abs 6.1 1.7 - 7.7 K/uL   Lymphocytes Relative 17 %   Lymphs Abs 1.6 0.7 - 4.0 K/uL   Monocytes Relative 10 %   Monocytes Absolute 0.9 0.1 - 1.0 K/uL   Eosinophils Relative 2 %  Eosinophils Absolute 0.2 0.0 - 0.5 K/uL   Basophils Relative 1 %   Basophils Absolute 0.1 0.0 - 0.1 K/uL   Immature Granulocytes 5 %   Abs Immature Granulocytes 0.45 (H) 0.00 - 0.07 K/uL    Comment: Performed at [facility], [address] [place], [place] [address]  Protime-INR     Status: Abnormal   Collection Time: 03/30/23 10:11 AM  Result Value Ref Range   Prothrombin Time 16.1 (H) 11.4 - 15.2 seconds   INR 1.3 (H) 0.8 - 1.2    Comment: (NOTE) INR goal varies based on device and disease states. Performed at [facility], [address], [place], [place] [address]   APTT     Status: None   Collection Time: 03/30/23 10:11 AM  Result Value Ref Range   aPTT 25 24 - 36 seconds    Comment: Performed at [facility], [address] [place], [place] [address]  Urinalysis, w/ Reflex to Culture (Infection Suspected) -Urine, Clean Catch     Status: Abnormal   Collection Time: 03/30/23 10:11 AM  Result Value Ref Range   Specimen Source URINE, CLEAN CATCH    Color, Urine YELLOW YELLOW   APPearance HAZY (A) CLEAR   Specific Gravity, Urine 1.005 1.005 - 1.030   pH 7.0 5.0 - 8.0   Glucose, UA NEGATIVE NEGATIVE mg/dL   Hgb urine dipstick NEGATIVE NEGATIVE   Bilirubin Urine NEGATIVE NEGATIVE   Ketones, ur NEGATIVE NEGATIVE mg/dL   Protein, ur NEGATIVE NEGATIVE mg/dL   Nitrite NEGATIVE NEGATIVE   Leukocytes,Ua LARGE (A) NEGATIVE   RBC / HPF 0-5 0 - 5 RBC/hpf   WBC, UA >50 0 - 5 WBC/hpf    Comment:        Reflex urine culture not performed if WBC <=10, OR if Squamous epithelial cells >5. If Squamous epithelial cells >5 suggest recollection.    Bacteria, UA FEW (A) NONE SEEN   Squamous Epithelial / HPF 0-5 0 - 5 /HPF   Mucus PRESENT    Non Squamous Epithelial 0-5 (A) NONE SEEN     Comment: Performed at [facility], [address], [place], [place] [address]  Brain natriuretic peptide     Status: Abnormal   Collection Time: 03/30/23 10:11 AM  Result Value Ref Range   B Natriuretic Peptide 229.3 (H) 0.0 - 100.0 pg/mL    Comment: Performed at [facility], [address], [place], [place] [address]  Urine Culture     Status: None (Preliminary result)   Collection Time: 03/30/23 10:11 AM   Specimen: Urine, Random  Result Value Ref Range   Specimen Description URINE, RANDOM    Special Requests      URINE, CLEAN CATCH Performed at [facility], [address], [place], [place] [address]    Culture PENDING    Report Status PENDING   Resp panel by RT-PCR (RSV, Flu A&B, Covid) Anterior Nasal Swab     Status: None   Collection Time: 03/30/23  1:08 PM   Specimen: Anterior Nasal Swab  Result Value Ref Range   SARS Coronavirus 2 by RT PCR NEGATIVE NEGATIVE   Influenza A by PCR NEGATIVE NEGATIVE   Influenza B by PCR NEGATIVE NEGATIVE    Comment: (NOTE) The Xpert Xpress SARS-CoV-2/FLU/RSV plus assay is intended as an aid in the diagnosis of influenza from Nasopharyngeal swab specimens and should not be used as a sole basis for  treatment. Nasal washings and aspirates are unacceptable for Xpert Xpress SARS-CoV-2/FLU/RSV testing.  Fact Sheet for Patients: BloggerCourse.com  Fact Sheet for Healthcare Providers: SeriousBroker.it  This test is not yet approved or cleared by the [place] FDA and has been authorized for detection and/or diagnosis of SARS-CoV-2 by FDA under an Emergency Use Authorization (EUA). This EUA will remain in effect (meaning this test can be used) for the duration of the COVID-19 declaration under Section 564(b)(1) of the Act, 21 U.S.C. section 360bbb-3(b)(1), unless the authorization is terminated or revoked.     Resp Syncytial Virus by PCR NEGATIVE NEGATIVE     Comment: (NOTE) Fact Sheet for Patients: BloggerCourse.com  Fact Sheet for Healthcare Providers: SeriousBroker.it  This test is not yet approved or cleared by the [place] FDA and has been authorized for detection and/or diagnosis of SARS-CoV-2 by FDA under an Emergency Use Authorization (EUA). This EUA will remain in effect (meaning this test can be used) for the duration of the COVID-19 declaration under Section 564(b)(1) of the Act, 21 U.S.C. section 360bbb-3(b)(1), unless the authorization is terminated or revoked.  Performed at [facility], [address] [place], [place] [address]    CT Head Wo Contrast  Result Date: 03/30/2023 CLINICAL DATA:  Mental status change.  Known CNS neoplasm EXAM: CT HEAD WITHOUT CONTRAST TECHNIQUE: Contiguous axial images were obtained from the base of the skull through the vertex without intravenous contrast. RADIATION DOSE REDUCTION: This exam was performed according to the departmental dose-optimization program which includes automated exposure control, adjustment of the mA and/or kV according to patient size and/or use of iterative reconstruction technique. COMPARISON:  03/11/2023, 02/14/2023 FINDINGS: Brain: Known left occipital lobe mass measuring approximately 3.0 cm with surrounding vasogenic edema. Approximately 4 mm of left-to-right midline shift is unchanged. Small nodule along the posterior right lateral ventricle is unchanged from prior and favored to represent a focus of gray matter heterotopia (series 3, image 19). No definite new mass lesion identified. No large territory acute infarction. No intracranial hemorrhage or extra-axial collection. No hydrocephalus. Vascular: Atherosclerotic calcifications involving the large vessels of the skull base. No unexpected hyperdense vessel. Skull: Normal. Negative for fracture or focal lesion. Sinuses/Orbits: No acute finding. Other: None.  IMPRESSION: 1. Known left occipital lobe mass with surrounding vasogenic edema, similar in appearance to prior. 4 mm of left-to-right midline shift, unchanged from prior. 2. 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 Electronically Signed   By: Duanne Guess D.O.   On: 03/30/2023 10:41   DG Chest Port 1 View  Result Date: 03/30/2023 CLINICAL DATA:  Possible sepsis. EXAM: PORTABLE CHEST 1 VIEW COMPARISON:  Chest x-rays dated 03/10/2023 03/06/2023. FINDINGS: New bibasilar airspace opacities. Probable small LEFT pleural effusion. No pneumothorax is seen. Heart size and mediastinal contours appear stable. Osseous structures about the chest are unremarkable. IMPRESSION: 1. New bibasilar airspace opacities, suspicious for multifocal pneumonia, alternatively asymmetric edema. 2. Probable small LEFT pleural effusion. Electronically Signed   By: Bary Richard M.D.   On: 03/30/2023 10:19    Pending Labs Unresulted Labs (From admission, onward)     Start     Ordered   04/06/23 0500  Creatinine, serum  (enoxaparin (LOVENOX)    CrCl >/= 30 ml/min)  Weekly,   R     Comments: while on enoxaparin therapy    03/30/23 1226   03/31/23 0500  Comprehensive metabolic panel  Tomorrow morning,   R        03/30/23 1226   03/31/23 0500  CBC  Tomorrow morning,   R        03/30/23 1226   03/30/23 1227  Procalcitonin  Daily,   R     References:    Procalcitonin Lower Respiratory Tract Infection AND Sepsis Procalcitonin Algorithm   03/30/23 1226   03/30/23 1224  Magnesium  Add-on,   AD        03/30/23 1226   03/30/23 1119  MRSA Next Gen by PCR, Nasal  (MRSA Screening)  Once,   URGENT        03/30/23 1119   03/30/23 0959  Blood Culture (routine x 2)  (Undifferentiated presentation (screening labs and basic nursing orders))  BLOOD CULTURE X 2,   STAT      03/30/23 0959            Vitals/Pain Today's Vitals    03/30/23 0949 03/30/23 1321 03/30/23 1400 03/30/23 1500  BP: (!) 144/63 (!) 154/64 (!) 140/72 128/65  Pulse: 84 77 80 80  Resp: (!) 22 18 15 20   Temp: 99 F (37.2 C) (!) 97.5 F (36.4 C)    TempSrc: Rectal Axillary    SpO2: 100% 95% 95% 94%  Weight: 72 kg     Height: 5\' 3"  (1.6 m)       Isolation Precautions Airborne and Contact precautions  Medications Medications  vancomycin (VANCOREADY) IVPB 1500 mg/300 mL (0 mg Intravenous Stopped 03/30/23 1401)    Followed by  vancomycin (VANCOCIN) IVPB 1000 mg/200 mL premix (has no administration in time range)  ceFEPIme (MAXIPIME) 2 g in sodium chloride 0.9 % 100 mL IVPB (0 g Intravenous Stopped 03/30/23 1235)  amiodarone (PACERONE) tablet 200 mg (has no administration in time range)  atorvastatin (LIPITOR) tablet 80 mg (has no administration in time range)  furosemide (LASIX) tablet 20 mg (has no administration in time range)  losartan (COZAAR) tablet 25 mg (has no administration in time range)  metoprolol tartrate (LOPRESSOR) tablet 50 mg (has no administration in time range)  spironolactone (ALDACTONE) tablet 12.5 mg (has no administration in time range)  Tafamidis CAPS 61 mg (has no administration in time range)  memantine (NAMENDA) tablet 10 mg (has no administration in time range)  enoxaparin (LOVENOX) injection 40 mg (has no administration in time range)  sodium chloride flush (NS) 0.9 % injection 3 mL (3 mLs Intravenous Not Given 03/30/23 1235)  acetaminophen (TYLENOL) tablet 650 mg (has no administration in time range)    Or  acetaminophen (TYLENOL) suppository 650 mg (has no administration in time range)  polyethylene glycol (MIRALAX / GLYCOLAX) packet 17 g (has no administration in time range)  insulin aspart (novoLOG) injection 0-9 Units (has no administration in time range)  lactated ringers bolus 1,000 mL (0 mLs Intravenous Stopped 03/30/23 1151)    Mobility non-ambulatory     Focused Assessments Neuro Assessment  Handoff:  Swallow screen pass? No  Cardiac Rhythm: Normal sinus rhythm       Neuro Assessment: Exceptions to WDL Neuro Checks:      Has TPA been given? Yes Temp: 97.5 F (36.4 C) (04/13 1321) Temp Source: Axillary (04/13 1321) BP: 128/65 (04/13 1500) Pulse Rate: 80 (04/13 1500) If patient is a Neuro Trauma and patient is going to OR before floor call report to 4N Charge nurse: [phone] or [phone]   R Recommendations: See Admitting Provider Note  Report given to:  Additional Notes: See top of SBAR

## 2023-03-30 NOTE — Progress Notes (Signed)
Pharmacy Antibiotic Note  Amanda Green is a 88 y.o. female for which pharmacy has been consulted for vancomycin dosing for pneumonia.  Patient with a history of breast cancer with brain metastasis, T2DM, HTN, dyslipidemia, cardiac amyloid, CVA. Patient presenting with AMS.  SCr 0.68 WBC 9.3; LA 1; T 99; HR 84; RR 22  Plan: Cefepime 2g q12hr  Vancomycin 1500 mg once then 1000 mg q24hr (eAUC 493.9) unless change in renal function Trend WBC, Fever, Renal function F/u cultures, clinical course, WBC De-escalate when able Levels at steady state F/u MRSA PCR  Height: 5\' 3"  (160 cm) Weight: 72 kg (158 lb 11.7 oz) IBW/kg (Calculated) : 52.4  Temp (24hrs), Avg:99 F (37.2 C), Min:99 F (37.2 C), Max:99 F (37.2 C)  Recent Labs  Lab 03/30/23 1011  WBC 9.3  CREATININE 0.68  LATICACIDVEN 1.0    Estimated Creatinine Clearance: 48 mL/min (by C-G formula based on SCr of 0.68 mg/dL).    Allergies  Allergen Reactions   Gabapentin     Drowsiness   Microbiology results: Pending  Thank you for allowing pharmacy to be a part of this patient's care.  Delmar Landau, PharmD, BCPS 03/30/2023 11:20 AM ED Clinical Pharmacist -  [phone]

## 2023-03-30 NOTE — Consult Note (Signed)
Consultation Note Date: 03/30/2023   Patient Name: Amanda Green  DOB: 04/05/1935  MRN: [id number]  Age / Sex: 88 y.o., female  PCP: Amanda Decamp, DO Referring Physician: Synetta Fail, MD  Reason for Consultation: Establishing goals of care, "Declining status in the setting of breast cancer metastatic to brain and recent incision for A-fib and CHF.  Now admitted for lethargy and suspected pneumonia.   Palliative care saw the patient during recent admission and they are open to palliative care seeing them again.  Discussed that even if she recovers from pneumonia she may have declined just from being in the hospital and will have to continue to reassess her status and fer likelihood of recovery.  Discussed it would be advantageous to have palliative care continue to follow in the hospital and out of the hospital prior to any transition to hospice care to make that transition easier if needed and add additional resources in the meantime. "  HPI/Patient Profile: 88 y.o. 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 Patient was admitted on 03/30/2023 with acute encephalopathy and pneumonia.  Of note, patient has had 2 admissions and 2 ED visits in the last 6 months. Most recent hospitalization was 3/23-03/25/23 with a.fib with RVR and CHF exacerbation. PMT was consulted during her last two admissions and goals have remained clear for full code/full scope. Per previous discussions with oncology on 03/25/23, they discussed if patient continued to decline over the  next 2 weeks recommend consideration of transition to hospice care.  Clinical Assessment and Goals of Care: I have reviewed medical records including EPIC notes, labs, and imaging. Received report from primary RN - no acute concerns. RN reports patient has remained disoriented, lethargic, but easily arousable.    Went to visit patient at bedside - daughter/Belinda present. Patient was lying in bed asleep - she easily awakens to touch but quickly falls back asleep . No signs or non-verbal gestures of pain or discomfort noted. No respiratory distress, increased work of breathing, or secretions noted.   Met with Belinda  to discuss diagnosis, prognosis, GOC, EOL wishes, disposition, and options. Offered to include patient's other two daughters, as noted they all three make decisions together, but per Massie Bougie they are not available. She reports they are "always communicating."  I re-introduced Palliative Medicine as specialized medical care for people living with serious illness. It focuses on providing relief from the symptoms and stress of a serious illness. The goal is to improve quality of life for both the patient and the family.  We discussed patient's interval history since discharge on 03/25/23. Massie Bougie reports that patient was doing well at rehab. She was "eating better" and participating in therapy. Massie Bougie shows pictures/videos of patient at rehab participating in therapies and smiling with family - pictures/videos range from earlier this week to most recent, yesterday/Friday. Belinda notes she first noted patient's decline this morning - patient was "hard to wake up." Albumin noted at 2.0 today on admission.   We discussed patient's current illness and what it means in the larger context of patient's on-going co-morbidities. Family have a clear understanding of patient's current medical situation. Have have  had several GOC with previous PMT providers. Natural disease trajectory and expectations at  EOL were discussed. I attempted to elicit values and goals of care important to the patient. The difference between aggressive medical intervention and comfort care was considered in light of the patient's goals of care. Goals at this time are to treat the treatable with watchful waiting. Family are hopeful pneumonia can be treated, patient can improve, and then return to rehab facility where she was finding benefit. Gently expressed concern that patient's body is frail and she may not have enough reserve to bounce back - she expressed understanding. Family are hopeful to take things "one day at a time." They are open to making stepwise decisions pending patient's clinical course.  During discussion of comfort care/hospice option, Massie Bougie tells me patient is "not going to hospice, we want her home." Education provided on the difference between home vs residential hospice. Hospice philosophy reviewed.  Encouraged patient/family to consider DNR/DNI status understanding evidenced based poor outcomes in similar hospitalized patient, as the cause of arrest is likely associated with advanced chronic/terminal illness rather than an easily reversible acute cardio-pulmonary event.  I shared that even if we pursued resuscitation we would not able to resolve the underlying factors. I explained that DNR/DNI does not change the medical plan and it only comes into effect after a person has arrested (died).  It is a protective measure to keep [place] from harming the patient in their last moments of life. Massie Bougie was not agreeable to DNR/DNI with understanding that patient would receive CPR, defibrillation, ACLS medications, and intubation. Massie Bougie states patient wanted full code "the last time she was here" and family want to respect her wishes. We did discuss that as people's health changes over time and quality of life declines, people often change their minds on what they are willing to go through based on the quality of life  we would bring them back to - she expressed understanding and would like to continue full code for now. Massie Bougie tells me family would consider DNR/DNI  "if we see clearly where things are going."  Chaplain support offered - Belinda declines. She is a Optician, dispensing and has a strong support system.  Discussed with Massie Bougie the importance of continued conversation with each other and the medical providers regarding overall plan of care and treatment options, ensuring decisions are within the context of the patient's values and GOCs.    Questions and concerns were addressed. The patient/family was encouraged to call with questions and/or concerns. PMT card was provided.   Primary Decision Maker: NEXT OF KIN - three daughters work together to make decisions on patient's behalf    SUMMARY OF RECOMMENDATIONS   Treat the treatable with watchful waiting Continue full code. DNR/DNI recommended Family open to making stepwise decisions pending clinical course Hospice/comfort measures would be reasonable for this patient with recurrent hospitalizations and generalized decline in context of breast cancer with mets to brain PMT will continue to follow and support holistically   Code Status/Advance Care Planning: Full code  Palliative Prophylaxis:  Aspiration, Frequent Pain Assessment, Oral Care, and Turn Reposition  Additional Recommendations (Limitations, Scope, Preferences): Full Scope Treatment  Psycho-social/Spiritual:  Desire for further Chaplaincy support:no Created space and opportunity for patient and family to express thoughts and feelings regarding patient's current medical situation.  Emotional support and therapeutic listening provided.  Prognosis:  < 6 months  Discharge Planning: To Be Determined      Primary Diagnoses: Present on Admission:  Paroxysmal atrial fibrillation  Essential hypertension  Cardiac amyloidosis  Acute encephalopathy   I have reviewed the medical  record, interviewed the patient and family, and examined the patient. The following aspects are pertinent.  Past Medical History:  Diagnosis Date   Arthritis    knees,   Breast cancer 1999   right mastectomy   Breast cancer, left 05/2019   left lumpectomy   Cerebrovascular accident (CVA) 02/07/2021   Diabetes mellitus    Dislocation of left shoulder joint    Family history of prostate cancer    Hyperlipidemia    Hypertension    Memory loss    Numbness and tingling of right lower extremity    Paresthesia    Stroke    light stroke - 2012 ,right leg nerve damage    Social History   Socioeconomic History   Marital status: Widowed    Spouse name: Not on file   Number of children: 5   Years of education: HS - 12 years   Highest education level: Not on file  Occupational History   Occupation: Retired  Tobacco Use   Smoking status: Never   Smokeless tobacco: Never  Vaping Use   Vaping Use: Never used  Substance and Sexual Activity   Alcohol use: No   Drug use: No   Sexual activity: Not on file  Other Topics Concern   Not on file  Social History Narrative   Lives at home alone.   Right-handed.   1 cup caffeine daily.   Social Determinants of Health   Financial Resource Strain: Not on file  Food Insecurity: No Food Insecurity (03/10/2023)   Hunger Vital Sign    Worried About Running Out of Food in the Last Year: Never true    Ran Out of Food in the Last Year: Never true  Transportation Needs: No Transportation Needs (03/10/2023)   PRAPARE - Administrator, Civil Service (Medical): No    Lack of Transportation (Non-Medical): No  Physical Activity: Not on file  Stress: Not on file  Social Connections: Not on file   Family History  Problem Relation Age of Onset   Esophageal cancer Mother        smoker   Other Father        Unsure of medical history.   Prostate cancer Brother        d. 76s   Cancer Son        ?? unsure, but may have had cancer    Cancer Son        ?? unsure, but may have had cancer   Scheduled Meds:  [START ON 03/31/2023] amiodarone  200 mg Oral Daily   [START ON 03/31/2023] atorvastatin  80 mg Oral Daily   enoxaparin (LOVENOX) injection  40 mg Subcutaneous Q24H   [START ON 03/31/2023] furosemide  20 mg Oral Daily   insulin aspart  0-9 Units Subcutaneous TID WC   [START ON 03/31/2023] losartan  25 mg Oral Daily   memantine  10 mg Oral BID   metoprolol tartrate  50 mg Oral BID   sodium chloride flush  3 mL Intravenous Q12H   [START ON 03/31/2023] spironolactone  12.5 mg Oral Daily   [START ON 03/31/2023] Tafamidis  61 mg Oral Daily   Continuous Infusions:  ceFEPime (MAXIPIME) IV Stopped (03/30/23 1235)   vancomycin 1,500 mg (03/30/23 1200)   Followed by   Melene Muller ON 03/31/2023] vancomycin     PRN Meds:.acetaminophen **OR** acetaminophen,  polyethylene glycol Medications Prior to Admission:  Prior to Admission medications   Medication Sig Start Date End Date Taking? Authorizing Provider  amiodarone (PACERONE) 200 MG tablet Take 1 tablet (200 mg total) by mouth daily. 03/26/23 04/25/23  Arrien, York Ram, MD  atorvastatin (LIPITOR) 80 MG tablet Take 80 mg by mouth daily.    [provider]  brimonidine-timolol (COMBIGAN) 0.2-0.5 % ophthalmic solution Place 1 drop into the left eye every 12 (twelve) hours.    [provider]  calcium carbonate (OSCAL) 1500 (600 Ca) MG TABS tablet Take 1,500 mg by mouth daily with breakfast.    [provider]  dexamethasone (DECADRON) 0.5 MG tablet Take 1 tablet (0.5 mg total) by mouth every other day for 4 days. 03/26/23 03/30/23  Arrien, York Ram, MD  dorzolamide (TRUSOPT) 2 % ophthalmic solution Place 1 drop into the left eye 2 (two) times daily. 10/19/20   [provider]  furosemide (LASIX) 20 MG tablet Take 1 tablet (20 mg total) by mouth daily. 03/26/23 04/25/23  Arrien, York Ram, MD  losartan (COZAAR) 25 MG tablet Take 1 tablet (25 mg  total) by mouth daily. 03/26/23 04/25/23  Arrien, York Ram, MD  magnesium 30 MG tablet Take 30 mg by mouth daily.    [provider]  memantine (NAMENDA) 10 MG tablet Take 1 tablet (10 mg total) by mouth 2 (two) times daily. 02/04/23   Ihor Austin, NP  metoprolol tartrate (LOPRESSOR) 50 MG tablet Take 1 tablet (50 mg total) by mouth 2 (two) times daily. 03/25/23 04/24/23  Arrien, York Ram, MD  ofloxacin (OCUFLOX) 0.3 % ophthalmic solution Place 1 drop into the right eye 4 (four) times daily. 02/06/23   [provider]  prednisoLONE acetate (PRED FORTE) 1 % ophthalmic suspension Place 1 drop into the right eye 4 (four) times daily. 07/18/22   [provider]  ROCKLATAN 0.02-0.005 % SOLN Place 1 drop into the left eye at bedtime. 01/31/23   [provider]  spironolactone (ALDACTONE) 25 MG tablet Take 0.5 tablets (12.5 mg total) by mouth daily. 03/26/23 04/25/23  Arrien, York Ram, MD  Tafamidis (VYNDAMAX) 61 MG CAPS Take 61 mg by mouth daily. 04/27/22   Croitoru, Mihai, MD  vitamin B-12 (CYANOCOBALAMIN) 1000 MCG tablet Take 1,000 mcg by mouth daily.    [provider]  vitamin C (ASCORBIC ACID) 500 MG tablet Take 500 mg by mouth daily.    [provider]   Allergies  Allergen Reactions   Gabapentin     Drowsiness   Review of Systems  Unable to perform ROS: Mental status change    Physical Exam Vitals and nursing note reviewed.  Constitutional:      General: She is not in acute distress.    Appearance: She is ill-appearing.  Pulmonary:     Effort: No respiratory distress.  Skin:    General: Skin is warm and dry.  Neurological:     Mental Status: She is lethargic.     Motor: Weakness present.     Vital Signs: BP (!) 154/64   Pulse 77   Temp (!) 97.5 F (36.4 C) (Axillary)   Resp 18   Ht  (1.6 m)   Wt 72 kg   SpO2 95%   BMI 28.12 kg/m  Pain Scale: PAINAD       SpO2: SpO2: 95 % O2 Device:SpO2: 95 % O2 Flow  Rate: .   IO: Intake/output summary: No intake or output data in  the 24 hours ending 03/30/23 1356  LBM:   Baseline Weight: Weight: 72 kg Most recent weight: Weight: 72 kg     Palliative Assessment/Data: PPS 20-30%     Time In: 1355 Time Out: 1525 Time Total: 90 minutes  Greater than 50%  of this time was spent counseling and coordinating care related to the above assessment and plan.  Signed by: Haskel Khan, NP   Please contact Palliative Medicine Team phone at [phone] for questions and concerns.  For individual provider: See Amion  *Portions of this note are a verbal dictation therefore any spelling and/or grammatical errors are due to the "[employer]" system interpretation.

## 2023-03-30 NOTE — ED Provider Notes (Signed)
[place] EMERGENCY DEPARTMENT AT [facility] Provider Note   CSN: [id number] Arrival date & time: 03/30/23  0940     History  Chief Complaint  Patient presents with   Altered Mental Status    Amanda Green is a 88 y.o. female with a past medical history significant for breast cancer complicated with brain mets, type 2 diabetes, hypertension, dyslipidemia, cardiac amyloid, and history of CVA who presents to the ED due to altered mental status.  Daughter at bedside provided history.  Daughter states for the past few days patient has been lethargic. She notes they were unable to wake her this morning which prompted family to call EMS.  Daughter saw patient yesterday and was still lethargic however, better than she was today.  No recent illness.  No fever.  Patient unable to provide history.  Level 5 caveat.  Chart reviewed.  Patient had a recent admission on 3/23 to 4/8 due to A-fib with RVR complicated by CHF exacerbation.  Patient placed on amiodarone for rate control.  It was decided not to start anticoagulants due to brain mets and thrombocytopenia.  During her recent admission it was noted that patient had a rapid decline in her performance status and it was recommended if no improvement in 2 weeks to transition to hospice services.   Discussed code status with daughter at bedside who notes patient wants to be a full code.        Home Medications Prior to Admission medications   Medication Sig Start Date End Date Taking? Authorizing Provider  amiodarone (PACERONE) 200 MG tablet Take 1 tablet (200 mg total) by mouth daily. 03/26/23 04/25/23  Arrien, York Ram, MD  atorvastatin (LIPITOR) 80 MG tablet Take 80 mg by mouth daily.    [provider]  brimonidine-timolol (COMBIGAN) 0.2-0.5 % ophthalmic solution Place 1 drop into the left eye every 12 (twelve) hours.    [provider]  calcium carbonate (OSCAL) 1500 (600 Ca) MG TABS tablet Take 1,500 mg  by mouth daily with breakfast.    [provider]  dexamethasone (DECADRON) 0.5 MG tablet Take 1 tablet (0.5 mg total) by mouth every other day for 4 days. 03/26/23 03/30/23  Arrien, York Ram, MD  dorzolamide (TRUSOPT) 2 % ophthalmic solution Place 1 drop into the left eye 2 (two) times daily. 10/19/20   [provider]  furosemide (LASIX) 20 MG tablet Take 1 tablet (20 mg total) by mouth daily. 03/26/23 04/25/23  Arrien, York Ram, MD  losartan (COZAAR) 25 MG tablet Take 1 tablet (25 mg total) by mouth daily. 03/26/23 04/25/23  Arrien, York Ram, MD  magnesium 30 MG tablet Take 30 mg by mouth daily.    [provider]  memantine (NAMENDA) 10 MG tablet Take 1 tablet (10 mg total) by mouth 2 (two) times daily. 02/04/23   Ihor Austin, NP  metoprolol tartrate (LOPRESSOR) 50 MG tablet Take 1 tablet (50 mg total) by mouth 2 (two) times daily. 03/25/23 04/24/23  Arrien, York Ram, MD  ofloxacin (OCUFLOX) 0.3 % ophthalmic solution Place 1 drop into the right eye 4 (four) times daily. 02/06/23   [provider]  prednisoLONE acetate (PRED FORTE) 1 % ophthalmic suspension Place 1 drop into the right eye 4 (four) times daily. 07/18/22   [provider]  ROCKLATAN 0.02-0.005 % SOLN Place 1 drop into the left eye at bedtime. 01/31/23   [provider]  spironolactone (ALDACTONE) 25 MG tablet Take 0.5 tablets (12.5 mg  total) by mouth daily. 03/26/23 04/25/23  Arrien, York Ram, MD  Tafamidis (VYNDAMAX) 61 MG CAPS Take 61 mg by mouth daily. 04/27/22   Croitoru, Mihai, MD  vitamin B-12 (CYANOCOBALAMIN) 1000 MCG tablet Take 1,000 mcg by mouth daily.    [provider]  vitamin C (ASCORBIC ACID) 500 MG tablet Take 500 mg by mouth daily.    [provider]      Allergies    Gabapentin    Review of Systems   Review of Systems  Unable to perform ROS: Mental status change    Physical Exam Updated Vital Signs BP (!) 154/64   Pulse  77   Temp (!) 97.5 F (36.4 C) (Axillary)   Resp 18   Ht 5\' 3"  (1.6 m)   Wt 72 kg   SpO2 95%   BMI 28.12 kg/m  Physical Exam Vitals and nursing note reviewed.  Constitutional:      General: She is not in acute distress.    Appearance: She is ill-appearing.     Comments: Chronically ill appearing. Leaning to left in bed. Opens eyes to voice, however falls back asleep.   HENT:     Head: Normocephalic.  Eyes:     Pupils: Pupils are equal, round, and reactive to light.  Cardiovascular:     Rate and Rhythm: Normal rate and regular rhythm.     Pulses: Normal pulses.     Heart sounds: Normal heart sounds. No murmur heard.    No friction rub. No gallop.  Pulmonary:     Effort: Pulmonary effort is normal.     Breath sounds: Normal breath sounds.  Abdominal:     General: Abdomen is flat. There is no distension.     Palpations: Abdomen is soft.     Tenderness: There is no abdominal tenderness. There is no guarding or rebound.  Musculoskeletal:        General: Normal range of motion.     Cervical back: Neck supple.  Skin:    General: Skin is warm and dry.  Neurological:     General: No focal deficit present.     Mental Status: She is alert.     Comments: Somnolent Unable to keep arms up Very poor grip strength bilaterally, but appears equal   Psychiatric:        Mood and Affect: Mood normal.        Behavior: Behavior normal.     ED Results / Procedures / Treatments   Labs (all labs ordered are listed, but only abnormal results are displayed) Labs Reviewed  COMPREHENSIVE METABOLIC PANEL - Abnormal; Notable for the following components:      Result Value   Glucose, Bld 135 (*)    Total Protein 4.9 (*)    Albumin 2.0 (*)    All other components within normal limits  CBC WITH DIFFERENTIAL/PLATELET - Abnormal; Notable for the following components:   RBC 3.79 (*)    Hemoglobin 11.0 (*)    HCT 33.7 (*)    RDW 18.5 (*)    Abs Immature Granulocytes 0.45 (*)    All other  components within normal limits  PROTIME-INR - Abnormal; Notable for the following components:   Prothrombin Time 16.1 (*)    INR 1.3 (*)    All other components within normal limits  URINALYSIS, W/ REFLEX TO CULTURE (INFECTION SUSPECTED) - Abnormal; Notable for the following components:   APPearance HAZY (*)    Leukocytes,Ua LARGE (*)  Bacteria, UA FEW (*)    Non Squamous Epithelial 0-5 (*)    All other components within normal limits  BRAIN NATRIURETIC PEPTIDE - Abnormal; Notable for the following components:   B Natriuretic Peptide 229.3 (*)    All other components within normal limits  URINE CULTURE  CULTURE, BLOOD (ROUTINE X 2)  CULTURE, BLOOD (ROUTINE X 2)  MRSA NEXT GEN BY PCR, NASAL  RESP PANEL BY RT-PCR (RSV, FLU A&B, COVID)  RVPGX2  LACTIC ACID, PLASMA  APTT  MAGNESIUM  PROCALCITONIN  CBG MONITORING, ED    EKG None  Radiology CT Head Wo Contrast  Result Date: 03/30/2023 CLINICAL DATA:  Mental status change.  Known CNS neoplasm EXAM: CT HEAD WITHOUT CONTRAST TECHNIQUE: Contiguous axial images were obtained from the base of the skull through the vertex without intravenous contrast. RADIATION DOSE REDUCTION: This exam was performed according to the departmental dose-optimization program which includes automated exposure control, adjustment of the mA and/or kV according to patient size and/or use of iterative reconstruction technique. COMPARISON:  03/11/2023, 02/14/2023 FINDINGS: Brain: Known left occipital lobe mass measuring approximately 3.0 cm with surrounding vasogenic edema. Approximately 4 mm of left-to-right midline shift is unchanged. Small nodule along the posterior right lateral ventricle is unchanged from prior and favored to represent a focus of gray matter heterotopia (series 3, image 19). No definite new mass lesion identified. No large territory acute infarction. No intracranial hemorrhage or extra-axial collection. No hydrocephalus. Vascular: Atherosclerotic  calcifications involving the large vessels of the skull base. No unexpected hyperdense vessel. Skull: Normal. Negative for fracture or focal lesion. Sinuses/Orbits: No acute finding. Other: None. IMPRESSION: 1. Known left occipital lobe mass with surrounding vasogenic edema, similar in appearance to prior. 4 mm of left-to-right midline shift, unchanged from prior. 2. 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 Electronically Signed   By: Duanne Guess D.O.   On: 03/30/2023 10:41   DG Chest Port 1 View  Result Date: 03/30/2023 CLINICAL DATA:  Possible sepsis. EXAM: PORTABLE CHEST 1 VIEW COMPARISON:  Chest x-rays dated 03/10/2023 03/06/2023. FINDINGS: New bibasilar airspace opacities. Probable small LEFT pleural effusion. No pneumothorax is seen. Heart size and mediastinal contours appear stable. Osseous structures about the chest are unremarkable. IMPRESSION: 1. New bibasilar airspace opacities, suspicious for multifocal pneumonia, alternatively asymmetric edema. 2. Probable small LEFT pleural effusion. Electronically Signed   By: Bary Richard M.D.   On: 03/30/2023 10:19    Procedures .Critical Care  Performed by: Mannie Stabile, PA-C Authorized by: Mannie Stabile, PA-C   Critical care provider statement:    Critical care time (minutes):  33   Critical care was necessary to treat or prevent imminent or life-threatening deterioration of the following conditions:  Dehydration   Critical care was time spent personally by me on the following activities:  Development of treatment plan with patient or surrogate, discussions with consultants, evaluation of patient's response to treatment, examination of patient, ordering and review of laboratory studies, ordering and review of radiographic studies, ordering and performing treatments and interventions, pulse oximetry, re-evaluation of patient's  condition and review of old charts   I assumed direction of critical care for this patient from another provider in my specialty: no     Care discussed with: admitting provider       Medications Ordered in ED Medications  vancomycin (VANCOREADY) IVPB 1500 mg/300 mL (1,500 mg Intravenous New Bag/Given  03/30/23 1200)    Followed by  vancomycin (VANCOCIN) IVPB 1000 mg/200 mL premix (has no administration in time range)  ceFEPIme (MAXIPIME) 2 g in sodium chloride 0.9 % 100 mL IVPB (0 g Intravenous Stopped 03/30/23 1235)  amiodarone (PACERONE) tablet 200 mg (has no administration in time range)  atorvastatin (LIPITOR) tablet 80 mg (has no administration in time range)  furosemide (LASIX) tablet 20 mg (has no administration in time range)  losartan (COZAAR) tablet 25 mg (has no administration in time range)  metoprolol tartrate (LOPRESSOR) tablet 50 mg (has no administration in time range)  spironolactone (ALDACTONE) tablet 12.5 mg (has no administration in time range)  Tafamidis CAPS 61 mg (has no administration in time range)  memantine (NAMENDA) tablet 10 mg (has no administration in time range)  enoxaparin (LOVENOX) injection 40 mg (has no administration in time range)  sodium chloride flush (NS) 0.9 % injection 3 mL (3 mLs Intravenous Not Given 03/30/23 1235)  acetaminophen (TYLENOL) tablet 650 mg (has no administration in time range)    Or  acetaminophen (TYLENOL) suppository 650 mg (has no administration in time range)  polyethylene glycol (MIRALAX / GLYCOLAX) packet 17 g (has no administration in time range)  insulin aspart (novoLOG) injection 0-9 Units (has no administration in time range)  lactated ringers bolus 1,000 mL (0 mLs Intravenous Stopped 03/30/23 1151)    ED Course/ Medical Decision Making/ A&P                             Medical Decision Making Amount and/or Complexity of Data Reviewed Independent Historian: caregiver External Data Reviewed: notes. Labs: ordered.  Decision-making details documented in ED Course. Radiology: ordered and independent interpretation performed. Decision-making details documented in ED Course. ECG/medicine tests: ordered and independent interpretation performed. Decision-making details documented in ED Course.  Risk Prescription drug management. Decision regarding hospitalization.   This patient presents to the ED for concern of AMS, this involves an extensive number of treatment options, and is a complaint that carries with it a high risk of complications and morbidity.  The differential diagnosis includes sepsis, CVA, decline in function, brain METs, etc  88 year old female presents to the ED due to altered mental status.  History of breast cancer with METs to the brain.  Recent admission. Per previous admission notes, patient has had a drastic decline in function over the past few weeks, likely transition to hospice care if no improvement. Confirmed code status with daughter at bedside during initial evaluation and is a full code.  Rectal temperature checked at bedside during initial evaluation which was 99 F.  Patient somnolent on exam and unable to provide history.  Daughter at bedside.  Upon arrival, patient mildly tachypneic at 22.  Normal heart rate and oxygen saturation.  Unable to perform neurological exam given patient is unable to keep her arms up or lift her legs from bed.  Very weak grip strength bilaterally however, appears to be equal.  Unclear etiology of altered mental status could be progression of brain mets versus infection.  Patient does have a Foley catheter in place.  Sepsis screening labs.  CT head.  IV fluids given.  CBC with no leukocytosis.  Anemia with hemoglobin 11.  Lactic acid normal.  BNP mildly elevated at 229, which is an improvement from previous BNP 3 weeks ago.  Chest x-ray personally reviewed and interpreted which demonstrates possible multifocal pneumonia and small left pleural effusion.  Given  altered mental status will start IV antibiotics for health care associated PNA.  UA with large leukocytes and few bacterial, possible UTI.  CT head with stable appearance of mass.  EKG normal sinus rhythm.  Reassessed patient at bedside.  Patient resting comfortably in bed.  Other daughter at bedside.  Discussed results with daughter and need for admission.  Suspect AMS multi factorial with possible pneumonia/UTI and metastatic breast cancer.  Will discuss with hospitalist for admission.  12:25 PM discussed with Dr. Alinda Money with [facility] who agrees to admit patient for further treatment.  Lives in living facility Has PCP  Discussed with Dr. Suezanne Jacquet who agrees with assessment and plan       Final Clinical Impression(s) / ED Diagnoses Final diagnoses:  Altered mental status, unspecified altered mental status type    Rx / DC Orders ED Discharge Orders     None         Jesusita Oka 03/30/23 1418    Lonell Grandchild, MD 03/31/23 [phone]

## 2023-03-31 DIAGNOSIS — G9340 Encephalopathy, unspecified: Secondary | ICD-10-CM | POA: Diagnosis not present

## 2023-03-31 DIAGNOSIS — Z7189 Other specified counseling: Secondary | ICD-10-CM | POA: Diagnosis not present

## 2023-03-31 DIAGNOSIS — R531 Weakness: Secondary | ICD-10-CM

## 2023-03-31 DIAGNOSIS — Z515 Encounter for palliative care: Secondary | ICD-10-CM | POA: Diagnosis not present

## 2023-03-31 DIAGNOSIS — R4182 Altered mental status, unspecified: Secondary | ICD-10-CM | POA: Diagnosis not present

## 2023-03-31 LAB — COMPREHENSIVE METABOLIC PANEL
ALT: 16 U/L (ref 0–44)
AST: 16 U/L (ref 15–41)
Albumin: 1.7 g/dL — ABNORMAL LOW (ref 3.5–5.0)
Alkaline Phosphatase: 67 U/L (ref 38–126)
Anion gap: 6 (ref 5–15)
BUN: 11 mg/dL (ref 8–23)
CO2: 24 mmol/L (ref 22–32)
Calcium: 9 mg/dL (ref 8.9–10.3)
Chloride: 107 mmol/L (ref 98–111)
Creatinine, Ser: 0.52 mg/dL (ref 0.44–1.00)
GFR, Estimated: >60 mL/min (ref >60)
Glucose, Bld: 165 mg/dL — ABNORMAL HIGH (ref 70–99)
Potassium: 3.7 mmol/L (ref 3.5–5.1)
Sodium: 137 mmol/L (ref 135–145)
Total Bilirubin: 0.7 mg/dL (ref 0.3–1.2)
Total Protein: 4.4 g/dL — ABNORMAL LOW (ref 6.5–8.1)

## 2023-03-31 LAB — CBC
HCT: 29.6 % — ABNORMAL LOW (ref 36.0–46.0)
Hemoglobin: 10.3 g/dL — ABNORMAL LOW (ref 12.0–15.0)
MCH: 29.9 pg (ref 26.0–34.0)
MCHC: 34.8 g/dL (ref 30.0–36.0)
MCV: 85.8 fL (ref 80.0–100.0)
Platelets: 265 10*3/uL (ref 150–400)
RBC: 3.45 MIL/uL — ABNORMAL LOW (ref 3.87–5.11)
RDW: 18.4 % — ABNORMAL HIGH (ref 11.5–15.5)
WBC: 7.7 10*3/uL (ref 4.0–10.5)
nRBC: 0 % (ref 0.0–0.2)

## 2023-03-31 LAB — GLUCOSE, CAPILLARY
Glucose-Capillary: 135 mg/dL — ABNORMAL HIGH (ref 70–99)
Glucose-Capillary: 183 mg/dL — ABNORMAL HIGH (ref 70–99)
Glucose-Capillary: 278 mg/dL — ABNORMAL HIGH (ref 70–99)

## 2023-03-31 LAB — C-REACTIVE PROTEIN: CRP: 19.8 mg/dL — ABNORMAL HIGH (ref <1.0)

## 2023-03-31 LAB — URINE CULTURE: Culture: >=100000 — AB

## 2023-03-31 LAB — CULTURE, BLOOD (ROUTINE X 2): Culture: NO GROWTH

## 2023-03-31 LAB — PROCALCITONIN: Procalcitonin: <0.1 ng/mL

## 2023-03-31 LAB — BRAIN NATRIURETIC PEPTIDE: B Natriuretic Peptide: 262.7 pg/mL — ABNORMAL HIGH (ref 0.0–100.0)

## 2023-03-31 LAB — MRSA NEXT GEN BY PCR, NASAL: MRSA by PCR Next Gen: NOT DETECTED

## 2023-03-31 MED ORDER — VITAMIN B-12 1000 MCG PO TABS
1000.0000 ug | ORAL_TABLET | Freq: Every day | ORAL | Status: DC
  Administered 2023-03-31 – 2023-04-02 (×3): 1000 ug via ORAL
  Filled 2023-03-31 (×3): qty 1

## 2023-03-31 MED ORDER — BRIMONIDINE TARTRATE 0.2 % OP SOLN
1.0000 [drp] | Freq: Two times a day (BID) | OPHTHALMIC | Status: DC
  Administered 2023-03-31 – 2023-04-02 (×5): 1 [drp] via OPHTHALMIC
  Filled 2023-03-31: qty 5

## 2023-03-31 MED ORDER — MAGNESIUM 30 MG PO TABS: 30.0000 mg | ORAL_TABLET | Freq: Every day | ORAL | Status: DC

## 2023-03-31 MED ORDER — NETARSUDIL-LATANOPROST 0.02-0.005 % OP SOLN
1.0000 [drp] | Freq: Every day | OPHTHALMIC | Status: DC
  Administered 2023-03-31: 1 [drp] via OPHTHALMIC

## 2023-03-31 MED ORDER — PANTOPRAZOLE SODIUM 40 MG PO TBEC
40.0000 mg | DELAYED_RELEASE_TABLET | Freq: Every day | ORAL | Status: DC
  Administered 2023-03-31 – 2023-04-02 (×3): 40 mg via ORAL
  Filled 2023-03-31 (×3): qty 1

## 2023-03-31 MED ORDER — DORZOLAMIDE HCL 2 % OP SOLN
1.0000 [drp] | Freq: Two times a day (BID) | OPHTHALMIC | Status: DC
  Administered 2023-03-31 – 2023-04-02 (×5): 1 [drp] via OPHTHALMIC
  Filled 2023-03-31: qty 10

## 2023-03-31 MED ORDER — TIMOLOL MALEATE 0.5 % OP SOLN
1.0000 [drp] | Freq: Two times a day (BID) | OPHTHALMIC | Status: DC
  Administered 2023-03-31 – 2023-04-02 (×5): 1 [drp] via OPHTHALMIC
  Filled 2023-03-31: qty 5

## 2023-03-31 MED ORDER — DEXAMETHASONE SODIUM PHOSPHATE 10 MG/ML IJ SOLN
6.0000 mg | INTRAMUSCULAR | Status: DC
  Administered 2023-03-31 – 2023-04-02 (×3): 6 mg via INTRAVENOUS
  Filled 2023-03-31 (×3): qty 1

## 2023-03-31 MED ORDER — OFLOXACIN 0.3 % OP SOLN
1.0000 [drp] | Freq: Four times a day (QID) | OPHTHALMIC | Status: DC
  Administered 2023-03-31 – 2023-04-02 (×7): 1 [drp] via OPHTHALMIC
  Filled 2023-03-31: qty 5

## 2023-03-31 MED ORDER — BRIMONIDINE TARTRATE-TIMOLOL 0.2-0.5 % OP SOLN
1.0000 [drp] | Freq: Two times a day (BID) | OPHTHALMIC | Status: DC
  Filled 2023-03-31: qty 5

## 2023-03-31 MED ORDER — PREDNISOLONE ACETATE 1 % OP SUSP
1.0000 [drp] | Freq: Four times a day (QID) | OPHTHALMIC | Status: DC
  Administered 2023-03-31 – 2023-04-02 (×7): 1 [drp] via OPHTHALMIC
  Filled 2023-03-31: qty 5

## 2023-03-31 NOTE — Progress Notes (Signed)
PROGRESS NOTE                                                                                                                                                                                                             Patient Demographics:    Amanda Green, is a 88 y.o. female, DOB - November 04, 1935, ZOX:[id number]  Outpatient Primary MD for the patient is [name], DO    LOS - 1  Admit date - 03/30/2023    Chief Complaint  Patient presents with   Altered Mental Status       Brief Narrative (HPI from H&P)    88 y.o. female with medical history significant of stroke, A-fib, hypertension, diabetes, spinal stenosis, cognitive impairment, cardiac amyloid and diastolic CHF, breast cancer with mets to the brain now with recent decline presenting with increasing lethargy and altered mental status.  The ER workup was suggestive of pneumonia and UTI and she was admitted to the hospital.   Subjective:    [name] today has, No headache, No chest pain, No abdominal pain - No Nausea, No new weakness tingling or numbness, no SOB.   Assessment  & Plan :    Acute toxic encephalopathy due to pneumonia and UTI present on admission in a patient with metastatic breast cancer with mets to the brain on chronic decline.  She has currently been admitted to the hospital placed on empiric IV antibiotics, although her long-term prognosis is quite poor and palliative care is on board family still wishes the patient to be full code, she has been adequately hydrated, follow culture sensitivity data, follow nasal MRSA PCR, adjust antibiotics as appropriate, PT, OT and speech follow-up.  If stable likely discharge in the next 1 to 2 days to home with outpatient follow-up with PCP and primary oncologist.  Stage IV metastatic breast cancer with mets to the brain.  CT of the head noted with some vasogenic edema and midline shift with mets to the left  occipital lobe.  She was on steroid taper at home, IV steroids for now and monitor.  Kindly see discussion above.  Palliative care following.   Cardiac amyloid, Diastolic CHF  - EF 60 to 65%, appears compensated at this time, continue home Lasix, spironolactone, losartan, metoprolol, continue home tafamidis.   History of CVA  - Continue home atorvastatin  Paroxysmal atrial fibrillation [place] vas 2 score of greater than 3.  Currently on rate controlling agents which are beta-blocker and amiodarone, not on anticoagulation due to brain mets and high risk for bleeding.      Cognitive impairment  - Continue home Namenda  DM2  - SSI   CBG (last 3)  Recent Labs    03/30/23 1711 03/31/23 0822  GLUCAP 78 135*         Condition - Extremely Guarded  Family Communication  :  daughter bedside 03/31/23  Code Status :  Full  Consults  :  Pall. Care  PUD Prophylaxis :  PPI   Procedures  :     CT Head - 1. Known left occipital lobe mass with surrounding vasogenic edema, similar in appearance to prior. 4 mm of left-to-right midline shift, unchanged from prior. 2. 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      Disposition Plan  :    Status is: Inpatient  DVT Prophylaxis  :    enoxaparin (LOVENOX) injection 40 mg Start: 03/30/23 1600    Lab Results  Component Value Date   PLT 265 03/31/2023    Diet :  Diet Order             Diet regular Fluid consistency: Thin  Diet effective now                    Inpatient Medications  Scheduled Meds:  amiodarone  200 mg Oral Daily   atorvastatin  80 mg Oral Daily   brimonidine-timolol  1 drop Left Eye Q12H   Chlorhexidine Gluconate Cloth  6 each Topical Daily   cyanocobalamin  1,000 mcg Oral Daily   dexamethasone (DECADRON) injection  6 mg Intravenous Q24H   dorzolamide  1 drop Left Eye BID   enoxaparin (LOVENOX) injection  40  mg Subcutaneous Q24H   furosemide  20 mg Oral Daily   insulin aspart  0-9 Units Subcutaneous TID WC   losartan  25 mg Oral Daily   magnesium  30 mg Oral Daily   memantine  10 mg Oral BID   metoprolol tartrate  50 mg Oral BID   Netarsudil-Latanoprost  1 drop Left Eye QHS   ofloxacin  1 drop Right Eye QID   pantoprazole  40 mg Oral Daily   prednisoLONE acetate  1 drop Right Eye QID   sodium chloride flush  3 mL Intravenous Q12H   spironolactone  12.5 mg Oral Daily   Tafamidis  61 mg Oral Daily   Continuous Infusions:  ceFEPime (MAXIPIME) IV Stopped (03/31/23 0012)   vancomycin     PRN Meds:.acetaminophen **OR** acetaminophen, polyethylene glycol    Objective:   Vitals:   03/30/23 2000 03/30/23 2030 03/31/23 0000 03/31/23 0400  BP: 129/68 122/74 124/61 (!) 143/66  Pulse: (!) 102 98 89 84  Resp: 16 16 18 20   Temp: 98.1 F (36.7 C) 98.3 F (36.8 C) 98.2 F (36.8 C) 98.2 F (36.8 C)  TempSrc: Oral Oral Oral Oral  SpO2: 95%  93% 90%  Weight:      Height:        Wt Readings from Last 3 Encounters:  03/30/23 72 kg  03/25/23 72.1 kg  03/06/23 69.7 kg     Intake/Output Summary (Last 24 hours) at 03/31/2023 0848 Last data filed at 03/31/2023 0337 Gross per 24 hour  Intake  938.39 ml  Output 950 ml  Net -11.61 ml     Physical Exam  Awake Alert, No new F.N deficits,   .AT,PERRAL Supple Neck, No JVD,   Symmetrical Chest wall movement, Good air movement bilaterally, CTAB RRR,No Gallops,Rubs or new Murmurs,  +ve B.Sounds, Abd Soft, No tenderness,   No Cyanosis, Clubbing or edema     RN pressure injury documentation: Pressure Injury 03/09/23 Buttocks Right Stage 2 -  Partial thickness loss of dermis presenting as a shallow open injury with a red, pink wound bed without slough. less than 1cm size. pink/red (Active)  03/09/23 1854  Location: Buttocks  Location Orientation: Right  Staging: Stage 2 -  Partial thickness loss of dermis presenting as a shallow open  injury with a red, pink wound bed without slough.  Wound Description (Comments): less than 1cm size. pink/red  Present on Admission: Yes  Dressing Type Foam - Lift dressing to assess site every shift 03/30/23 2030      Data Review:    Recent Labs  Lab 03/30/23 1011 03/31/23 0456  WBC 9.3 7.7  HGB 11.0* 10.3*  HCT 33.7* 29.6*  PLT 273 265  MCV 88.9 85.8  MCH 29.0 29.9  MCHC 32.6 34.8  RDW 18.5* 18.4*  LYMPHSABS 1.6  --   MONOABS 0.9  --   EOSABS 0.2  --   BASOSABS 0.1  --     Recent Labs  Lab 03/30/23 1011 03/30/23 1923 03/31/23 0456  NA 137  --  137  K 3.5  --  3.7  CL 103  --  107  CO2 28  --  24  ANIONGAP 6  --  6  GLUCOSE 135*  --  165*  BUN 8  --  11  CREATININE 0.68  --  0.52  AST 16  --  16  ALT 19  --  16  ALKPHOS 76  --  67  BILITOT 0.9  --  0.7  ALBUMIN 2.0*  --  1.7*  PROCALCITON  --  <0.10 <0.10  LATICACIDVEN 1.0  --   --   INR 1.3*  --   --   BNP 229.3*  --   --   MG  --  1.9  --   CALCIUM 9.5  --  9.0      Recent Labs  Lab 03/30/23 1011 03/30/23 1923 03/31/23 0456  PROCALCITON  --  <0.10 <0.10  LATICACIDVEN 1.0  --   --   INR 1.3*  --   --   BNP 229.3*  --   --   MG  --  1.9  --   CALCIUM 9.5  --  9.0     Lab Results  Component Value Date   HGBA1C 6.5 (H) 02/14/2023    Radiology Reports CT Head Wo Contrast  Result Date: 03/30/2023 CLINICAL DATA:  Mental status change.  Known CNS neoplasm EXAM: CT HEAD WITHOUT CONTRAST TECHNIQUE: Contiguous axial images were obtained from the base of the skull through the vertex without intravenous contrast. RADIATION DOSE REDUCTION: This exam was performed according to the departmental dose-optimization program which includes automated exposure control, adjustment of the mA and/or kV according to patient size and/or use of iterative reconstruction technique. COMPARISON:  03/11/2023, 02/14/2023 FINDINGS: Brain: Known left occipital lobe mass measuring approximately 3.0 cm with surrounding  vasogenic edema. Approximately 4 mm of left-to-right midline shift is unchanged. Small nodule along the posterior right lateral ventricle is unchanged from prior and favored to represent  a focus of gray matter heterotopia (series 3, image 19). No definite new mass lesion identified. No large territory acute infarction. No intracranial hemorrhage or extra-axial collection. No hydrocephalus. Vascular: Atherosclerotic calcifications involving the large vessels of the skull base. No unexpected hyperdense vessel. Skull: Normal. Negative for fracture or focal lesion. Sinuses/Orbits: No acute finding. Other: None. IMPRESSION: 1. Known left occipital lobe mass with surrounding vasogenic edema, similar in appearance to prior. 4 mm of left-to-right midline shift, unchanged from prior. 2. 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 Electronically Signed   By: Duanne Guess D.O.   On: 03/30/2023 10:41   DG Chest Port 1 View  Result Date: 03/30/2023 CLINICAL DATA:  Possible sepsis. EXAM: PORTABLE CHEST 1 VIEW COMPARISON:  Chest x-rays dated 03/10/2023 03/06/2023. FINDINGS: New bibasilar airspace opacities. Probable small LEFT pleural effusion. No pneumothorax is seen. Heart size and mediastinal contours appear stable. Osseous structures about the chest are unremarkable. IMPRESSION: 1. New bibasilar airspace opacities, suspicious for multifocal pneumonia, alternatively asymmetric edema. 2. Probable small LEFT pleural effusion. Electronically Signed   By: Bary Richard M.D.   On: 03/30/2023 10:19      Signature  -   Susa Raring M.D on 03/31/2023 at 8:48 AM   -  To page go to [email or web address]

## 2023-03-31 NOTE — Evaluation (Signed)
Clinical/Bedside Swallow Evaluation Patient Details  Name: Amanda Green MRN: [id number] Date of Birth: [birth year]  Today's Date: 03/31/2023 Time: SLP Start Time (ACUTE ONLY): 1232 SLP Stop Time (ACUTE ONLY): 1244 SLP Time Calculation (min) (ACUTE ONLY): 12 min  Past Medical History:  Past Medical History:  Diagnosis Date   Arthritis    knees,   Breast cancer 1999   right mastectomy   Breast cancer, left 05/2019   left lumpectomy   Cerebrovascular accident (CVA) 02/07/2021   Diabetes mellitus    Dislocation of left shoulder joint    Family history of prostate cancer    Hyperlipidemia    Hypertension    Memory loss    Numbness and tingling of right lower extremity    Paresthesia    Stroke    light stroke - 2012 ,right leg nerve damage    Past Surgical History:  Past Surgical History:  Procedure Laterality Date   ABDOMINAL HYSTERECTOMY     APPENDECTOMY     BREAST BIOPSY Left 01/19/2010   BREAST LUMPECTOMY WITH RADIOACTIVE SEED LOCALIZATION Left 07/17/2019   Procedure: LEFT BREAST LUMPECTOMY WITH RADIOACTIVE SEED LOCALIZATION;  Surgeon: Luretha Murphy, MD;  Location: [facility];  Service: General;  Laterality: Left;   BREAST SURGERY     Right mastectomy 1999   CARPAL TUNNEL RELEASE Right 09/30/2014   Procedure: RIGHT CARPAL TUNNEL RELEASE;  Surgeon: Cindee Salt, MD;  Location: [facility];  Service: Orthopedics;  Laterality: Right;   COLONOSCOPY     EYE SURGERY     cataracts   JOINT REPLACEMENT     lt total knee 2011   LIPOMA EXCISION  11/28/2011   Procedure: EXCISION LIPOMA;  Surgeon: Valarie Merino, MD;  Location: [facility];  Service: General;  Laterality: N/A;  excisino lipoma 4 cm back of neck   MASTECTOMY Right 1999   TOTAL MASTECTOMY Left 05/10/2021   Procedure: LEFT TOTAL MASTECTOMY;  Surgeon: Luretha Murphy, MD;  Location: [facility];  Service: General;  Laterality: Left;   HPI:  Pt is an 88 yo female with  metastatice breast ca with mets to the brain admitted 4/13  with PNA and UTI. PMH includes: stroke, cognitive impairment, a-fib, HTN, spinal stenosis, CHF    Assessment / Plan / Recommendation  Clinical Impression  Pt's oropharyngeal swallow appears to be [facility] with no overt signs concerning for dysphagia or aspiration. She and her daughter deny any h/o PNA. Eructation was noted throughout PO trials but she denies any esophageal symptoms. Recommend that she remain on regular solids and thin liquids. No further SLP f/u indicated at this time. SLP Visit Diagnosis: Dysphagia, unspecified (R13.10)    Aspiration Risk  Mild aspiration risk    Diet Recommendation Regular;Thin liquid   Liquid Administration via: Cup;Straw Medication Administration: Whole meds with liquid Supervision: Staff to assist with self feeding Compensations: Minimize environmental distractions;Slow rate;Small sips/bites Postural Changes: Seated upright at 90 degrees;Remain upright for at least 30 minutes after po intake    Other  Recommendations Oral Care Recommendations: Oral care BID    Recommendations for follow up therapy are one component of a multi-disciplinary discharge planning process, led by the attending physician.  Recommendations may be updated based on patient status, additional functional criteria and insurance authorization.  Follow up Recommendations No SLP follow up      Assistance Recommended at Discharge    Functional Status Assessment Patient has not had a recent decline in their functional status  Frequency and Duration            Prognosis        Swallow Study   General HPI: Pt is an 88 yo female with metastatice breast ca with mets to the brain admitted 4/13  with PNA and UTI. PMH includes: stroke, cognitive impairment, a-fib, HTN, spinal stenosis, CHF Type of Study: Bedside Swallow Evaluation Previous Swallow Assessment: none in chart Diet Prior to this Study: Regular;Thin liquids (Level  0) Temperature Spikes Noted: No Respiratory Status: Room air History of Recent Intubation: No Behavior/Cognition: Alert;Cooperative;Requires cueing Oral Cavity Assessment: Within Functional Limits Oral Care Completed by SLP: No Oral Cavity - Dentition: Adequate natural dentition Vision: Functional for self-feeding Self-Feeding Abilities: Needs assist Patient Positioning: Upright in bed Baseline Vocal Quality: Low vocal intensity Volitional Cough: Weak Volitional Swallow: Unable to elicit    Oral/Motor/Sensory Function Overall Oral Motor/Sensory Function: Other (comment) (reduced ROM bilaterally when asked to smile)   Ice Chips Ice chips: Not tested   Thin Liquid Thin Liquid: Within functional limits Presentation: Straw;Self Fed    Nectar Thick Nectar Thick Liquid: Not tested   Honey Thick Honey Thick Liquid: Not tested   Puree Puree: Within functional limits Presentation: Spoon   Solid     Solid: Within functional limits Presentation: Self Fed      Mahala Menghini., M.A. CCC-SLP Acute Rehabilitation Services Office [phone]  Secure chat preferred  03/31/2023,1:31 PM

## 2023-03-31 NOTE — Progress Notes (Addendum)
Daily Progress Note   Patient Name: Amanda Green       Date: 03/31/2023 DOB: March 25, 1935  Age: 88 y.o. MRN#: [id number] Attending Physician: Leroy Sea, MD Primary Care Physician: [name], DO Admit Date: 03/30/2023  Reason for Consultation/Follow-up: Establishing goals of care  Subjective: I have reviewed medical records including EPIC notes, MAR, and labs. Received report from primary RN - no acute concerns. RN reports patient ate 100% of breakfast, is more alert today.  Went to visit patient at bedside - daughter Massie Bougie present. Patient was lying in bed awake, alert, oriented to self and place only, and able to participate in simple conversation. No signs or non-verbal gestures of pain or discomfort noted. No respiratory distress, increased work of breathing, or secretions noted. She denies pain or shortness of breath. Patient states she "feels pretty good."  Emotional support provided to [facility]. Massie Bougie expresses happiness that patient has improved since yesterday. She notes patient ate 100% of her dinner yesterday and breakfast this morning. Massie Bougie has been told that patient may likely discharge tomorrow; however she has reserves about this. Discussed that medications can be adjusted for the outpatient setting, oral vs IV, and patient can continue to find benefit with treatments, such as antibiotics, if indicated. Massie Bougie was encouraged to hear this. Massie Bougie understands that patient is at high risk for rehospitalization as it is anticipated she will continue to decline.  Belinda also expresses worry that she is unsure if patient still has a bed at [facility] - discussed that [facility] would be in contact with family about discharge planning. Reviewed if [facility] no longer had a bed, we would search for another rehab facility. She questions if patient would have to come home - reviewed option of home with [facility] therapies; however, patient would need 24/7 supervision/assistance - she expressed understanding. She is hopeful [facility] will still have patient's bed available.  All questions and concerns addressed. Encouraged to call with questions and/or concerns. PMT card previously provided.  TOC notified of Belinda's questions/concerns regarding discharge location.  Length of Stay: 1  Current Medications: Scheduled Meds:   amiodarone  200 mg Oral Daily   atorvastatin  80 mg Oral Daily   brimonidine  1 drop Left Eye BID   And   timolol  1 drop Left Eye  BID   Chlorhexidine Gluconate Cloth  6 each Topical Daily   cyanocobalamin  1,000 mcg Oral Daily   dexamethasone (DECADRON) injection  6 mg Intravenous Q24H   dorzolamide  1 drop Left Eye BID   enoxaparin (LOVENOX) injection  40 mg Subcutaneous Q24H   furosemide  20 mg Oral Daily   insulin aspart  0-9 Units Subcutaneous TID WC   losartan  25 mg Oral Daily   memantine  10 mg Oral BID   metoprolol tartrate  50 mg Oral BID   Netarsudil-Latanoprost  1 drop Left Eye QHS   ofloxacin  1 drop Right Eye QID   pantoprazole  40 mg Oral Daily   prednisoLONE acetate  1 drop Right Eye QID   sodium chloride flush  3 mL Intravenous Q12H   spironolactone  12.5 mg Oral Daily   Tafamidis  61 mg Oral Daily    Continuous Infusions:  ceFEPime (MAXIPIME) IV Stopped (03/31/23 0012)   vancomycin      PRN Meds: acetaminophen **OR** acetaminophen, polyethylene glycol  Physical Exam Vitals and nursing note reviewed.  Constitutional:      General: She is not in acute distress. Pulmonary:     Effort: No respiratory distress.  Skin:    General: Skin is warm and dry.  Neurological:     Mental Status: She is alert. Mental status is at baseline. She is disoriented and confused.     Motor: Weakness present.   Psychiatric:        Attention and Perception: Attention normal.        Behavior: Behavior is cooperative.        Cognition and Memory: Memory is impaired.             Vital Signs: BP 116/63   Pulse 97   Temp 98.2 F (36.8 C) (Oral)   Resp 15   Ht  (1.6 m)   Wt 72 kg   SpO2 93%   BMI 28.12 kg/m  SpO2: SpO2: 93 % O2 Device: O2 Device: Room Air O2 Flow Rate:    Intake/output summary:  Intake/Output Summary (Last 24 hours) at 03/31/2023 1114 Last data filed at 03/31/2023 1000 Gross per 24 hour  Intake 1178.39 ml  Output 950 ml  Net 228.39 ml   LBM: Last BM Date : 03/30/23 Baseline Weight: Weight: 72 kg Most recent weight: Weight: 72 kg       Palliative Assessment/Data: PPS 40%      Patient Active Problem List   Diagnosis Date Noted   Acute encephalopathy 03/30/2023   CAP (community acquired pneumonia) 03/30/2023   Elevated d-dimer 03/17/2023   Pressure injury of skin 03/17/2023   Encephalopathy acute 03/12/2023   Atrial fibrillation with RVR 03/09/2023   Acute on chronic diastolic CHF (congestive heart failure) 03/09/2023   AKI (acute kidney injury) 03/09/2023   Elevated troponin 03/09/2023   Leukocytosis 03/09/2023   Neoplasm of brain causing mass effect on adjacent structures 02/14/2023   Cardiac amyloidosis 02/14/2023   Spinal stenosis of lumbar region with neurogenic claudication 03/15/2022   Gait abnormality 11/13/2021   Low back pain without sciatica 11/13/2021   Ductal carcinoma in situ (DCIS) of breast 05/10/2021   Paroxysmal atrial fibrillation 04/14/2021   Essential hypertension 04/14/2021   Controlled type 2 diabetes mellitus without complication, without long-term current use of insulin 04/14/2021   Cognitive impairment 02/07/2021   Genetic testing 09/25/2019   Family history of prostate cancer    Breast cancer metastasized to  brain 06/10/2019   Pain 08/13/2018   Trigger ring finger of left hand 08/13/2018   Paresthesia of right lower  extremity 01/28/2017   History of CVA (cerebrovascular accident) 01/28/2017   Colon polyps 08/15/2016   Chronic right shoulder pain 11/24/2015   Right mastectomy 1999 03/26/2012    Palliative Care Assessment & Plan   Patient Profile: 88 y.o. 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 Patient was admitted on 03/30/2023 with acute encephalopathy and pneumonia.   Of note, patient has had 2 admissions and 2 ED visits in the last 6 months. Most recent hospitalization was 3/23-03/25/23 with a.fib with RVR and CHF exacerbation. PMT was consulted during her last two admissions and goals have remained clear for full code/full scope. Per previous discussions with oncology on 03/25/23, they discussed if patient continued to decline over the next 2 weeks recommend consideration of transition to hospice care.  Assessment: Principal Problem:   Acute encephalopathy Active Problems:   History of CVA (cerebrovascular accident)   Paroxysmal atrial fibrillation   Essential hypertension   Controlled type 2 diabetes mellitus without complication, without long-term current use of insulin   Cardiac amyloidosis   CAP (community acquired pneumonia)   Concern about end of life  Recommendations/Plan: Continue to treat the treatable Continue full code as previously documented Family open to making stepwise decisions pending clinical course. Patient shows improvement today - family are hopeful for her discharge back to SNF rehab with outpatient Palliative Care and will follow up with Oncology [facility] consult placed for: outpatient Palliative Care referral. I do not see  where a referral was sent during her last admission. She would benefit from follow up sooner than later Hospice/comfort measures would be reasonable for this patient with recurrent hospitalizations and generalized decline in context of breast cancer with mets to brain PMT will continue to follow peripherally. If there are any imminent needs please call the service directly   Goals of Care and Additional Recommendations: Limitations on Scope of Treatment: Full Scope Treatment  Code Status:    Code Status Orders  (From admission, onward)           Start     Ordered   03/30/23 1223  Full code  Continuous       Question:  By:  Answer:  Consent: discussion documented in EHR   03/30/23 1226           Code Status History     Date Active Date Inactive Code Status Order ID Comments User Context   03/09/2023 1723 03/26/2023 0313 Full Code [id number]  Orland Mustard, MD ED   02/14/2023 0356 02/18/2023 2320 Full Code [id number]  Hillary Bow, DO ED   02/14/2023 0147 02/14/2023 0356 Full Code [id number]  Hillary Bow, DO ED   05/10/2021 1718 05/11/2021 1730 Full Code [id number]  Luretha Murphy, MD Inpatient   04/14/2021 1437 04/15/2021 2040 Full Code [id number]  Clydie Braun, MD ED   08/15/2016 1525 08/17/2016 1409 Full Code [id number]  Luretha Murphy, MD Inpatient       Prognosis:  < 6 months  Discharge Planning: Skilled Nursing Facility for rehab with Palliative care  service follow-up  Care plan was discussed with primary RN, patient's daughter, patient  Thank you for allowing the Palliative Medicine Team to assist in the care of this patient.   Haskel Khan, NP  Please contact Palliative Medicine Team phone at [phone] for questions and concerns.   *Portions of this note are a verbal dictation therefore any spelling and/or grammatical errors are due to the "[employer]agon [employer] One" system interpretation.

## 2023-04-01 ENCOUNTER — Inpatient Hospital Stay (HOSPITAL_COMMUNITY): Payer: Medicare PPO

## 2023-04-01 DIAGNOSIS — G9340 Encephalopathy, unspecified: Secondary | ICD-10-CM | POA: Diagnosis not present

## 2023-04-01 DIAGNOSIS — R5383 Other fatigue: Secondary | ICD-10-CM | POA: Diagnosis not present

## 2023-04-01 LAB — CBC WITH DIFFERENTIAL/PLATELET
Abs Immature Granulocytes: 0.51 10*3/uL — ABNORMAL HIGH (ref 0.00–0.07)
Basophils Absolute: 0 10*3/uL (ref 0.0–0.1)
Basophils Relative: 0 %
Eosinophils Absolute: 0 10*3/uL (ref 0.0–0.5)
Eosinophils Relative: 0 %
HCT: 27.2 % — ABNORMAL LOW (ref 36.0–46.0)
Hemoglobin: 9.1 g/dL — ABNORMAL LOW (ref 12.0–15.0)
Immature Granulocytes: 6 %
Lymphocytes Relative: 11 %
Lymphs Abs: 1 10*3/uL (ref 0.7–4.0)
MCH: 29.1 pg (ref 26.0–34.0)
MCHC: 33.5 g/dL (ref 30.0–36.0)
MCV: 86.9 fL (ref 80.0–100.0)
Monocytes Absolute: 0.6 10*3/uL (ref 0.1–1.0)
Monocytes Relative: 7 %
Neutro Abs: 6.9 10*3/uL (ref 1.7–7.7)
Neutrophils Relative %: 76 %
Platelets: 278 10*3/uL (ref 150–400)
RBC: 3.13 MIL/uL — ABNORMAL LOW (ref 3.87–5.11)
RDW: 18.1 % — ABNORMAL HIGH (ref 11.5–15.5)
Smear Review: NORMAL
WBC: 9.1 10*3/uL (ref 4.0–10.5)
nRBC: 0 % (ref 0.0–0.2)

## 2023-04-01 LAB — BASIC METABOLIC PANEL
Anion gap: 8 (ref 5–15)
BUN: 13 mg/dL (ref 8–23)
CO2: 23 mmol/L (ref 22–32)
Calcium: 9.1 mg/dL (ref 8.9–10.3)
Chloride: 106 mmol/L (ref 98–111)
Creatinine, Ser: 0.62 mg/dL (ref 0.44–1.00)
GFR, Estimated: >60 mL/min (ref >60)
Glucose, Bld: 269 mg/dL — ABNORMAL HIGH (ref 70–99)
Potassium: 3.9 mmol/L (ref 3.5–5.1)
Sodium: 137 mmol/L (ref 135–145)

## 2023-04-01 LAB — GLUCOSE, CAPILLARY
Glucose-Capillary: 233 mg/dL — ABNORMAL HIGH (ref 70–99)
Glucose-Capillary: 244 mg/dL — ABNORMAL HIGH (ref 70–99)
Glucose-Capillary: 273 mg/dL — ABNORMAL HIGH (ref 70–99)
Glucose-Capillary: 272 mg/dL — ABNORMAL HIGH (ref 70–99)

## 2023-04-01 LAB — MAGNESIUM: Magnesium: 1.6 mg/dL — ABNORMAL LOW (ref 1.7–2.4)

## 2023-04-01 LAB — BRAIN NATRIURETIC PEPTIDE: B Natriuretic Peptide: 271.6 pg/mL — ABNORMAL HIGH (ref 0.0–100.0)

## 2023-04-01 LAB — C-REACTIVE PROTEIN: CRP: 10.3 mg/dL — ABNORMAL HIGH (ref <1.0)

## 2023-04-01 MED ORDER — MAGNESIUM SULFATE 4 GM/100ML IV SOLN
4.0000 g | Freq: Once | INTRAVENOUS | Status: AC
  Administered 2023-04-01: 4 g via INTRAVENOUS
  Filled 2023-04-01: qty 100

## 2023-04-01 NOTE — TOC Progression Note (Signed)
Transition of Care [facility]) - Progression Note    Patient Details  Name: Yasuko Pozo MRN: [id number] Date of Birth: [birth year]  Transition of Care [facility]) CM/SW Contact  Erin Sons, [place] Phone Number: 04/01/2023, 11:03 AM  Clinical Narrative:     CSW met with pt's daughter, [name], bedside to discuss disposition. Pt is from [facility] for STR. Family is wanting pt to return home with [facility] instead of SNF. Daughter Massie Bougie is also included on speaker phone. They understand that medicare covers [facility] services which don't included frequent personal care givers. Personal care would be up to the family and/or private duty care givers. Daughters state pt would need hospital bed, 3in1, wheelchair, hoyer lift. CSW discussed OP palliative services. Familly agreeable for referral to be sent for OP palliative; they have no preference and are okay with [facility]. RNCM notified who will continue to assist with disposition plans for home.   Expected Discharge Plan: Home w Home Health Services Barriers to Discharge: Continued Medical Work up  Expected Discharge Plan and Services                                               Social Determinants of Health (SDOH) Interventions SDOH Screenings   Food Insecurity: No Food Insecurity (03/30/2023)  Housing: Low Risk  (03/30/2023)  Transportation Needs: No Transportation Needs (03/30/2023)  Utilities: Not At Risk (03/30/2023)  Tobacco Use: Low Risk  (03/30/2023)    Readmission Risk Interventions     No data to display

## 2023-04-01 NOTE — Progress Notes (Signed)
PROGRESS NOTE                                                                                                                                                                                                             Patient Demographics:    Amanda Green, is a 88 y.o. female, DOB - 1935-07-18, ZOX:[id number]  Outpatient Primary MD for the patient is [name], DO    LOS - 2  Admit date - 03/30/2023    Chief Complaint  Patient presents with   Altered Mental Status       Brief Narrative (HPI from H&P)    88 y.o. female with medical history significant of stroke, A-fib, hypertension, diabetes, spinal stenosis, cognitive impairment, cardiac amyloid and diastolic CHF, breast cancer with mets to the brain now with recent decline presenting with increasing lethargy and altered mental status.  The ER workup was suggestive of pneumonia and UTI and she was admitted to the hospital.   Subjective:   Patient in bed, appears comfortable, denies any headache, no fever, no chest pain or pressure, no shortness of breath , no abdominal pain. No new focal weakness.   Assessment  & Plan :    Acute toxic encephalopathy due to pneumonia and UTI present on admission in a patient with metastatic breast cancer with mets to the brain on chronic decline.  She has currently been admitted to the hospital placed on empiric IV antibiotics, although her long-term prognosis is quite poor and palliative care is on board family still wishes the patient to be full code, she has been adequately hydrated, follow culture sensitivity data, negative MRSA PCR, adjust antibiotics as appropriate, PT, OT and speech follow-up.  If stable likely discharge in the next 1 to 2 days to home with outpatient follow-up with PCP and primary oncologist.  Stage IV metastatic breast cancer with mets to the brain.  CT of the head noted with some vasogenic edema and midline  shift with mets to the left occipital lobe.  She was on steroid taper at home, IV steroids for now and monitor.  Kindly see discussion above.  Palliative care following.   Cardiac amyloid, Diastolic CHF  - EF 60 to 65%, appears compensated at this time, continue home Lasix, spironolactone, losartan, metoprolol, continue home tafamidis.   History of CVA  - Continue  home atorvastatin   Paroxysmal atrial fibrillation [place] vas 2 score of greater than 3.  Currently on rate controlling agents which are beta-blocker and amiodarone, not on anticoagulation due to brain mets and high risk for bleeding.      Cognitive impairment  - Continue home Namenda  DM2  - SSI   CBG (last 3)  Recent Labs    03/31/23 1151 03/31/23 1545 04/01/23 0812  GLUCAP 183* 278* 233*         Condition - Extremely Guarded  Family Communication  :  daughter bedside 03/31/23  Code Status :  Full  Consults  :  Pall. Care  PUD Prophylaxis :  PPI   Procedures  :     CT Head - 1. Known left occipital lobe mass with surrounding vasogenic edema, similar in appearance to prior. 4 mm of left-to-right midline shift, unchanged from prior. 2. 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      Disposition Plan  :    Status is: Inpatient  DVT Prophylaxis  :    enoxaparin (LOVENOX) injection 40 mg Start: 03/30/23 1600    Lab Results  Component Value Date   PLT 278 04/01/2023    Diet :  Diet Order             Diet regular Fluid consistency: Thin  Diet effective now                    Inpatient Medications  Scheduled Meds:  amiodarone  200 mg Oral Daily   atorvastatin  80 mg Oral Daily   brimonidine  1 drop Left Eye BID   And   timolol  1 drop Left Eye BID   Chlorhexidine Gluconate Cloth  6 each Topical Daily   cyanocobalamin  1,000 mcg Oral Daily   dexamethasone (DECADRON) injection  6 mg Intravenous  Q24H   dorzolamide  1 drop Left Eye BID   enoxaparin (LOVENOX) injection  40 mg Subcutaneous Q24H   furosemide  20 mg Oral Daily   insulin aspart  0-9 Units Subcutaneous TID WC   losartan  25 mg Oral Daily   memantine  10 mg Oral BID   metoprolol tartrate  50 mg Oral BID   Netarsudil-Latanoprost  1 drop Left Eye QHS   ofloxacin  1 drop Right Eye QID   pantoprazole  40 mg Oral Daily   prednisoLONE acetate  1 drop Right Eye QID   sodium chloride flush  3 mL Intravenous Q12H   spironolactone  12.5 mg Oral Daily   Tafamidis  61 mg Oral Daily   Continuous Infusions:  ceFEPime (MAXIPIME) IV 2 g (03/31/23 2332)   magnesium sulfate bolus IVPB     vancomycin 1,000 mg (03/31/23 1234)   PRN Meds:.acetaminophen **OR** acetaminophen, polyethylene glycol    Objective:   Vitals:   03/31/23 1945 04/01/23 0000 04/01/23 0400 04/01/23 0759  BP: (!) 102/56 (!) 107/56 (!) 116/51 124/64  Pulse: 73 69 61 64  Resp: (!) 22 20 20  (!) 25  Temp: 98.6 F (37 C) 98.6 F (37 C) 98 F (36.7 C)   TempSrc: Oral Oral Oral   SpO2: 94% 94% 95%   Weight:      Height:        Wt Readings from Last 3 Encounters:  03/30/23 72 kg  03/25/23 72.1 kg  03/06/23 69.7 kg  Intake/Output Summary (Last 24 hours) at 04/01/2023 0819 Last data filed at 03/31/2023 1300 Gross per 24 hour  Intake 240 ml  Output 700 ml  Net -460 ml     Physical Exam  Awake Alert, No new F.N deficits,   [place].AT,PERRAL Supple Neck, No JVD,   Symmetrical Chest wall movement, Good air movement bilaterally, CTAB RRR,No Gallops,Rubs or new Murmurs,  +ve B.Sounds, Abd Soft, No tenderness,   No Cyanosis, Clubbing or edema     RN pressure injury documentation: Pressure Injury 03/09/23 Buttocks Right Stage 2 -  Partial thickness loss of dermis presenting as a shallow open injury with a red, pink wound bed without slough. less than 1cm size. pink/red (Active)  03/09/23 1854  Location: Buttocks  Location Orientation: Right   Staging: Stage 2 -  Partial thickness loss of dermis presenting as a shallow open injury with a red, pink wound bed without slough.  Wound Description (Comments): less than 1cm size. pink/red  Present on Admission: Yes  Dressing Type Foam - Lift dressing to assess site every shift 03/31/23 0950      Data Review:    Recent Labs  Lab 03/30/23 1011 03/31/23 0456 04/01/23 0553  WBC 9.3 7.7 9.1  HGB 11.0* 10.3* 9.1*  HCT 33.7* 29.6* 27.2*  PLT 273 265 278  MCV 88.9 85.8 86.9  MCH 29.0 29.9 29.1  MCHC 32.6 34.8 33.5  RDW 18.5* 18.4* 18.1*  LYMPHSABS 1.6  --  1.0  MONOABS 0.9  --  0.6  EOSABS 0.2  --  0.0  BASOSABS 0.1  --  0.0    Recent Labs  Lab 03/30/23 1011 03/30/23 1014 03/30/23 1923 03/31/23 0451 03/31/23 0456 04/01/23 0553  NA 137  --   --   --  137 137  K 3.5  --   --   --  3.7 3.9  CL 103  --   --   --  107 106  CO2 28  --   --   --  24 23  ANIONGAP 6  --   --   --  6 8  GLUCOSE 135*  --   --   --  165* 269*  BUN 8  --   --   --  11 13  CREATININE 0.68  --   --   --  0.52 0.62  AST 16  --   --   --  16  --   ALT 19  --   --   --  16  --   ALKPHOS 76  --   --   --  67  --   BILITOT 0.9  --   --   --  0.7  --   ALBUMIN 2.0*  --   --   --  1.7*  --   CRP  --  19.8*  --   --   --  10.3*  PROCALCITON  --   --  <0.10  --  <0.10  --   LATICACIDVEN 1.0  --   --   --   --   --   INR 1.3*  --   --   --   --   --   BNP 229.3*  --   --  262.7*  --  271.6*  MG  --   --  1.9  --   --  1.6*  CALCIUM 9.5  --   --   --  9.0 9.1  Recent Labs  Lab 03/30/23 1011 03/30/23 1014 03/30/23 1923 03/31/23 0451 03/31/23 0456 04/01/23 0553  CRP  --  19.8*  --   --   --  10.3*  PROCALCITON  --   --  <0.10  --  <0.10  --   LATICACIDVEN 1.0  --   --   --   --   --   INR 1.3*  --   --   --   --   --   BNP 229.3*  --   --  262.7*  --  271.6*  MG  --   --  1.9  --   --  1.6*  CALCIUM 9.5  --   --   --  9.0 9.1     Lab Results  Component Value Date   HGBA1C 6.5 (H)  02/14/2023    Radiology Reports DG Chest Port 1 View  Result Date: 04/01/2023 CLINICAL DATA:  88 year old female with history of shortness of breath. EXAM: PORTABLE CHEST 1 VIEW COMPARISON:  Chest x-ray 03/30/2023. FINDINGS: Opacity at the left base which may reflect atelectasis and/or consolidation, likely with superimposed small left pleural effusion. Right lung is clear. No right pleural effusion. No pneumothorax. No evidence of pulmonary edema. Heart size is mildly enlarged. The patient is rotated to the left on today's exam, resulting in distortion of the mediastinal contours and reduced diagnostic sensitivity and specificity for mediastinal pathology. Atherosclerotic calcifications in the thoracic aorta. Surgical clips in the right axillary region likely from prior lymph node dissection. IMPRESSION: 1. Atelectasis and/or consolidation in the left lung base with superimposed small left pleural effusion. 2. Aortic atherosclerosis. Electronically Signed   By: Trudie Reed M.D.   On: 04/01/2023 06:56   CT Head Wo Contrast  Result Date: 03/30/2023 CLINICAL DATA:  Mental status change.  Known CNS neoplasm EXAM: CT HEAD WITHOUT CONTRAST TECHNIQUE: Contiguous axial images were obtained from the base of the skull through the vertex without intravenous contrast. RADIATION DOSE REDUCTION: This exam was performed according to the departmental dose-optimization program which includes automated exposure control, adjustment of the mA and/or kV according to patient size and/or use of iterative reconstruction technique. COMPARISON:  03/11/2023, 02/14/2023 FINDINGS: Brain: Known left occipital lobe mass measuring approximately 3.0 cm with surrounding vasogenic edema. Approximately 4 mm of left-to-right midline shift is unchanged. Small nodule along the posterior right lateral ventricle is unchanged from prior and favored to represent a focus of gray matter heterotopia (series 3, image 19). No definite new mass  lesion identified. No large territory acute infarction. No intracranial hemorrhage or extra-axial collection. No hydrocephalus. Vascular: Atherosclerotic calcifications involving the large vessels of the skull base. No unexpected hyperdense vessel. Skull: Normal. Negative for fracture or focal lesion. Sinuses/Orbits: No acute finding. Other: None. IMPRESSION: 1. Known left occipital lobe mass with surrounding vasogenic edema, similar in appearance to prior. 4 mm of left-to-right midline shift, unchanged from prior. 2. 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 Electronically Signed   By: Duanne Guess D.O.   On: 03/30/2023 10:41   DG Chest Port 1 View  Result Date: 03/30/2023 CLINICAL DATA:  Possible sepsis. EXAM: PORTABLE CHEST 1 VIEW COMPARISON:  Chest x-rays dated 03/10/2023 03/06/2023. FINDINGS: New bibasilar airspace opacities. Probable small LEFT pleural effusion. No pneumothorax is seen. Heart size and mediastinal contours appear stable. Osseous structures about the chest are unremarkable. IMPRESSION: 1. New bibasilar airspace  opacities, suspicious for multifocal pneumonia, alternatively asymmetric edema. 2. Probable small LEFT pleural effusion. Electronically Signed   By: Bary Richard M.D.   On: 03/30/2023 10:19      Signature  -   Susa Raring M.D on 04/01/2023 at 8:19 AM   -  To page go to [email or web address]

## 2023-04-01 NOTE — Evaluation (Signed)
Physical Therapy Evaluation and Discharge Patient Details Name: Amanda Green MRN: [id number] DOB: [birth year] Today's Date: 04/01/2023  History of Present Illness  Pt is an 88 y.o. female admitted 03/30/23 fomr the ED where she presented with increased lethargy and altered mental status with ED workup suggestive of pneumonia and UTI. Pt medical history significant of stroke, A-fib, hypertension, diabetes, spinal stenosis, cognitive impairment, cardiac amyloid and diastolic CHF, breast cancer with mets to the brain. Pt recently discharged from [facility] on 03/25/23 to SNF for short-term rehab and presents now with a decline.   Clinical Impression  Pt with progressive decline both cognitively and functionally due to known brain mets from metastatic breast cancer. Pt is totalA for bed mobility at this time. Pt strongly desires to return home. Family and patient decided to return home with [facility] services and palliative care. Educated pt on use of bed pads (recommended family purchase some) for bed mobility and proper body mechanics to protect themselves. Educated on positioning and skin breakdown prevention. Pt to benefit from [facility] PT to educate family on how to use hoyer lift, w/c, and hospital that is being delivered to their home tomorrow prior to d/c. Pt with no further acute PT needs at this time. PT SIGNING OFF.       Recommendations for follow up therapy are one component of a multi-disciplinary discharge planning process, led by the attending physician.  Recommendations may be updated based on patient status, additional functional criteria and insurance authorization.  Follow Up Recommendations Can patient physically be transported by private vehicle: No     Assistance Recommended at Discharge Frequent or constant Supervision/Assistance  Patient can return home with the following  Assistance with cooking/housework;Help with stairs or ramp for entrance;Assist for transportation;Two people to help with  walking and/or transfers;A lot of help with bathing/dressing/bathroom    Equipment Recommendations Wheelchair (measurements PT);Wheelchair cushion (measurements PT);Hospital bed;Other (comment) (hoyer lift)  Recommendations for Other Services       Functional Status Assessment Patient has had a recent decline in their functional status and demonstrates the ability to make significant improvements in function in a reasonable and predictable amount of time.     Precautions / Restrictions Precautions Precautions: Fall Restrictions Weight Bearing Restrictions: No      Mobility  Bed Mobility Overal bed mobility: Needs Assistance Bed Mobility: Rolling Rolling: Total assist         General bed mobility comments: used bad pads to assist pt with rolling L/R, verbal and tactile cues given for pt reach across for opposite bedrail to aide in rolling however very limited effort due to fatigue and lethargy    Transfers                   General transfer comment: at this time requires hoyer lift    Ambulation/Gait               General Gait Details: unable this date  Stairs            Wheelchair Mobility    Modified Rankin (Stroke Patients Only)       Balance                                             Pertinent Vitals/Pain Pain Assessment Pain Assessment: No/denies pain    Home Living Family/patient expects to be discharged to::  Private residence Living Arrangements: Children;Other relatives;Other (Comment) (Pt lives in her own home with 24/7 assistance from multiple family members who rotate staying with pt.) Available Help at Discharge: Family;Available 24 hours/day Type of Home: House Home Access: Ramped entrance       Home Layout: One level Home Equipment: Agricultural consultant (2 wheels);Rollator (4 wheels);Cane - single point;Shower seat;BSC/3in1;Wheelchair - manual Additional Comments: daughter reports family will be providing  24/7 assist    Prior Function Prior Level of Function : Needs assist             Mobility Comments: worked with therapy at [facility] rehab ADLs Comments: assist from [facility] staff     Hand Dominance   Dominant Hand: Right    Extremity/Trunk Assessment   Upper Extremity Assessment Upper Extremity Assessment: Defer to OT evaluation    Lower Extremity Assessment Lower Extremity Assessment: Generalized weakness    Cervical / Trunk Assessment Cervical / Trunk Assessment: Kyphotic  Communication   Communication:  (soft spoken)  Cognition Arousal/Alertness: Lethargic Behavior During Therapy: Flat affect Overall Cognitive Status: Impaired/Different from baseline (recent progressive decline both cognitively and functionally) Area of Impairment: Attention, Following commands, Orientation, Safety/judgement, Problem solving                 Orientation Level: Person (Oreiented to person and family) Current Attention Level: Focused (easily falls asleep)   Following Commands: Follows one step commands with increased time     Problem Solving: Slow processing, Decreased initiation, Difficulty sequencing, Requires verbal cues, Requires tactile cues General Comments: pt lethargic and frequently closing eyes        General Comments General comments (skin integrity, edema, etc.): VSS. educated family on body mechanics during transfers and to order bed pads    Exercises     Assessment/Plan    PT Assessment All further PT needs can be met in the next venue of care  PT Problem List Decreased strength;Decreased activity tolerance;Decreased balance;Decreased mobility;Decreased safety awareness;Decreased coordination       PT Treatment Interventions DME instruction;Gait training;Stair training;Functional mobility training;Therapeutic activities;Therapeutic exercise;Neuromuscular re-education;Patient/family education    PT Goals (Current goals can be found in the Care Plan section)   Acute Rehab PT Goals Patient Stated Goal: go home PT Goal Formulation: With family Time For Goal Achievement: 04/11/23 Potential to Achieve Goals: Poor    Frequency Min 1X/week     Co-evaluation PT/OT/SLP Co-Evaluation/Treatment: Yes Reason for Co-Treatment: For patient/therapist safety PT goals addressed during session:  (family education) OT goals addressed during session: Other (comment);ADL's and self-care (Family education regarding safely assisting pt with ADLs in the bed and role of home health OT.)       AM-PAC PT "6 Clicks" Mobility  Outcome Measure Help needed turning from your back to your side while in a flat bed without using bedrails?: Total Help needed moving from lying on your back to sitting on the side of a flat bed without using bedrails?: Total Help needed moving to and from a bed to a chair (including a wheelchair)?: Total Help needed standing up from a chair using your arms (e.g., wheelchair or bedside chair)?: Total Help needed to walk in hospital room?: Total Help needed climbing 3-5 steps with a railing? : Total 6 Click Score: 6    End of Session   Activity Tolerance: Patient limited by lethargy;Patient limited by fatigue Patient left: in bed;with call bell/phone within reach;with family/visitor present;with bed alarm set Nurse Communication: Mobility status PT Visit Diagnosis: Unsteadiness on feet (  R26.81);Other abnormalities of gait and mobility (R26.89);Muscle weakness (generalized) (M62.81);History of falling (Z91.81)    Time: 1244-1300 PT Time Calculation (min) (ACUTE ONLY): 16 min   Charges:   PT Evaluation $PT Eval Moderate Complexity: 1 Mod          Lewis Shock, PT, DPT Acute Rehabilitation Services Secure chat preferred Office #: [phone]   Iona Hansen 04/01/2023, 2:24 PM

## 2023-04-01 NOTE — Evaluation (Signed)
Occupational Therapy Evaluation Patient Details Name: Amanda Green MRN: [id number] DOB: 10-30-1935 Today's Date: 04/01/2023   History of Present Illness Pt is an 88 y.o. female admitted 03/30/23 fomr the ED where she presented with increased lethargy and altered mental status with ED workup suggestive of pneumonia and UTI. Pt medical history significant of stroke, A-fib, hypertension, diabetes, spinal stenosis, cognitive impairment, cardiac amyloid and diastolic CHF, breast cancer with mets to the brain. Pt recently discharged from [facility] on 03/25/23 to SNF for short-term rehab and presents now with a decline.   Clinical Impression   At baseline PLOF, pt required Mod I for ADLs, functional transfers, and functional mobility with assistance from family for IADLs. At time of recent [facility] discharge to SNF for short term rehab on 03/25/23, pt required Set up for self feeding and up to Max assist for UB/LB bathing/dressing and all steps of toileting task. Pt with recent decline due to present illness oulined above. Pt currently presents with lethargy and decreased cognitive status. Pt currently requires Mod Assist for self feeding and Total assist for UB/LB dressing/bathing and toileting in the bed. Pt plans to DC home with 24/7 family assistance and palliative care services. Pt and family will benefit from acute skilled OT services to increase pt safety and participation in bed mobility in preparation for ADLs and ADLs in the bed with caregiver support. Post acute discharge, home health OT is recommended to train family in safe use of DME, including hospital bed and Hoyer-style mechanical lift.      Recommendations for follow up therapy are one component of a multi-disciplinary discharge planning process, led by the attending physician.  Recommendations may be updated based on patient status, additional functional criteria and insurance authorization.   Assistance Recommended at Discharge Frequent or constant  Supervision/Assistance  Patient can return home with the following Two people to help with walking and/or transfers;Two people to help with bathing/dressing/bathroom;Assistance with cooking/housework;Assistance with feeding;Direct supervision/assist for medications management;Direct supervision/assist for financial management;Assist for transportation;Help with stairs or ramp for entrance    Functional Status Assessment  Patient has had a recent decline in their functional status and/or demonstrates limited ability to make significant improvements in function in a reasonable and predictable amount of time  Equipment Recommendations  Hospital bed;BSC/3in1;Other (comment);Wheelchair (measurements OT);Wheelchair cushion (measurements OT) (Mechanical Hoyer-style lift, gel overlay for hospital bed mattress)    Recommendations for Other Services       Precautions / Restrictions Precautions Precautions: Fall Restrictions Weight Bearing Restrictions: No      Mobility Bed Mobility Overal bed mobility: Needs Assistance Bed Mobility: Rolling Rolling: Total assist         General bed mobility comments: Pt demonstrates ability to grasp/release bed rail during rolling with multimodal cues.    Transfers Overall transfer level: Needs assistance                 General transfer comment: Pt currently requiring Total assist +2 with use of mechanical lift for functional transfers.      Balance                                           ADL either performed or assessed with clinical judgement   ADL Overall ADL's : Needs assistance/impaired Eating/Feeding: Moderate assistance;Cueing for sequencing;Bed level   Grooming: Maximal assistance;Cueing for sequencing;Bed level   Upper Body Bathing: Total  assistance;Bed level   Lower Body Bathing: Total assistance;Bed level;+2 for physical assistance;+2 for safety/equipment   Upper Body Dressing : Total assistance;Bed  level   Lower Body Dressing: Total assistance;+2 for physical assistance;+2 for safety/equipment;Bed level   Toilet Transfer: Total assistance;+2 for physical assistance;+2 for safety/equipment;BSC/3in1 (Mechanical lift)   Toileting- Clothing Manipulation and Hygiene: Total assistance;+2 for physical assistance;+2 for safety/equipment;Bed level       Functional mobility during ADLs: Total assistance;Wheelchair       Vision Patient Visual Report: Other (comment) (Difficult to assess due to pt lethargy and current cognitive status. Pt kept eyes close during approx. 75% of OT eval.)       Perception     Praxis      Pertinent Vitals/Pain Pain Assessment Pain Assessment: Faces Faces Pain Scale: Hurts a little bit Pain Descriptors / Indicators: Discomfort Pain Intervention(s): Repositioned, Limited activity within patient's tolerance, Monitored during session     Hand Dominance Right   Extremity/Trunk Assessment Upper Extremity Assessment Upper Extremity Assessment: Generalized weakness   Lower Extremity Assessment Lower Extremity Assessment: Defer to PT evaluation   Cervical / Trunk Assessment Cervical / Trunk Assessment: Kyphotic   Communication Communication Communication: No difficulties   Cognition Arousal/Alertness: Lethargic Behavior During Therapy: WFL for tasks assessed/performed Overall Cognitive Status: Impaired/Different from baseline (Hx of cognitive impairments with recent decline per family report) Area of Impairment: Attention, Following commands, Orientation, Safety/judgement, Problem solving, Memory                 Orientation Level: Person (Oreiented to person and family) Current Attention Level: Focused Memory: Decreased short-term memory Following Commands: Follows one step commands with increased time Safety/Judgement: Decreased awareness of safety   Problem Solving: Slow processing, Decreased initiation, Difficulty sequencing, Requires  verbal cues, Requires tactile cues General Comments: Slow processing, needs cueing to reshift focus     General Comments       Exercises     Shoulder Instructions      Home Living Family/patient expects to be discharged to:: Private residence Living Arrangements: Children;Other relatives;Other (Comment) (Pt lives in her own home with 24/7 assistance from multiple family members who rotate staying with pt.) Available Help at Discharge: Family;Available 24 hours/day Type of Home: House Home Access: Ramped entrance     Home Layout: One level     Bathroom Shower/Tub: Producer, television/film/video: Standard Bathroom Accessibility: Yes How Accessible: Accessible via walker            Prior Functioning/Environment Prior Level of Function : Needs assist               ADLs Comments: At baseline PLOF, pt was Mod I with ADLs and required assist of family for IADLs. At time of recent [facility] discharge to SNF for short term rehab on 03/25/23, pt required Set up for self feeding and up to Max assist for UB/LB bathing/dressing and all steps of toileting task.        OT Problem List: Decreased strength;Decreased activity tolerance;Impaired balance (sitting and/or standing);Decreased coordination;Decreased cognition;Decreased safety awareness      OT Treatment/Interventions: Patient/family education;Self-care/ADL training;DME and/or AE instruction    OT Goals(Current goals can be found in the care plan section) Acute Rehab OT Goals Patient Stated Goal: Pt unable to state. Pt's daughters report they want pt to return home with family support and palliative services. OT Goal Formulation: With patient/family Time For Goal Achievement: 04/15/23 ADL Goals Pt Will Perform Lower Body Bathing: with  total assist;with caregiver independent in assisting;bed level Pt Will Perform Upper Body Dressing: with max assist;with caregiver independent in assisting;bed level Pt Will Perform Lower  Body Dressing: with total assist;with caregiver independent in assisting;bed level  OT Frequency: Min 2X/week    Co-evaluation PT/OT/SLP Co-Evaluation/Treatment: Yes Reason for Co-Treatment: For patient/therapist safety   OT goals addressed during session: Other (comment);ADL's and self-care (Family education regarding safely assisting pt with ADLs in the bed and role of home health OT.)      AM-PAC OT "6 Clicks" Daily Activity     Outcome Measure Help from another person eating meals?: A Lot Help from another person taking care of personal grooming?: A Lot Help from another person toileting, which includes using toliet, bedpan, or urinal?: Total Help from another person bathing (including washing, rinsing, drying)?: Total Help from another person to put on and taking off regular upper body clothing?: Total Help from another person to put on and taking off regular lower body clothing?: Total 6 Click Score: 8   End of Session Nurse Communication: Mobility status;Need for lift equipment;Other (comment) (Discharge planning)  Activity Tolerance: Patient limited by fatigue;Patient limited by lethargy Patient left: in bed;with call bell/phone within reach;with bed alarm set;with family/visitor present  OT Visit Diagnosis: Muscle weakness (generalized) (M62.81);Unsteadiness on feet (R26.81);Other symptoms and signs involving cognitive function                Time: 1244-1259 OT Time Calculation (min): 15 min Charges:  OT General Charges $OT Visit: 1 Visit OT Evaluation $OT Eval Moderate Complexity: 1 Mod  [address] OTR/L, MA Acute Rehab [phone]   Lendon Colonel 04/01/2023, 2:10 PM

## 2023-04-01 NOTE — Progress Notes (Signed)
Family wants foley out to see if patient can void on her own prior to discharge home in the am. MD agreed to take foley out per family request.

## 2023-04-01 NOTE — TOC Progression Note (Signed)
Transition of Care [facility]) - Progression Note    Patient Details  Name: Amanda Green MRN: [id number] Date of Birth: [birth year]  Transition of Care [facility]) CM/SW Contact  Gordy Clement, RN Phone Number: 04/01/2023, 11:38 AM  Clinical Narrative:    CM met with Patient and her Daughter, [name] in patient's room. Patient verbalized wanting to return to her own home with Home Health and Palliative. Patient's preference is for [facility] and  [facility]. Referrals sent and they have been accepted for services.    The following DME has been requested from [employer] and will be delivered to patient's home today for dc on Tuesday : Hospital Bed with gel overlay/wheelchair/BSC/hoyer lift.  Delivery is to be coordinated with either of Patient's Daughters- [phone] or Massie Bougie @ [phone]   Fayrene Fearing from [facility] has been notified .   Transportation will be provided via [facility] to home  [facility] will need to be arranged in am. LMN in chart to be printed along with Facesheet   TOC will continue to follow for any addition DC needs       Expected Discharge Plan: Home w Home Health Services (And palliative) Barriers to Discharge: Continued Medical Work up  Expected Discharge Plan and Services   Discharge Planning Services: CM Consult Post Acute Care Choice: Home Health Living arrangements for the past 2 months: Single Family Home                 DME Arranged: Bedside commode, Gel overlay, Hospital bed, Lightweight manual wheelchair with seat cushion, Other see comment (HOYER LIFT) DME Agency: [facility] Date DME Agency Contacted: 04/01/23 Time DME Agency Contacted: 1135 Representative spoke with at DME Agency: Fayrene Fearing HH Arranged: RN, PT, OT, Nurse's Aide HH Agency: [facility] Date [facility] Agency Contacted: 04/01/23 Time HH Agency Contacted: 1135 Representative spoke with at [facility] Agency: Denyse Amass   Social Determinants of Health (SDOH) Interventions SDOH Screenings   Food Insecurity: No  Food Insecurity (03/30/2023)  Housing: Low Risk  (03/30/2023)  Transportation Needs: No Transportation Needs (03/30/2023)  Utilities: Not At Risk (03/30/2023)  Tobacco Use: Low Risk  (03/30/2023)    Readmission Risk Interventions     No data to display

## 2023-04-01 NOTE — Progress Notes (Signed)
[facility]) Hospital Liaison Note  Notified by TOC manager of patient/family request for [facility] palliative services at home after discharge.   [facility] hospital liaison will follow patient for discharge disposition.   Please call with any hospice or outpatient palliative care related questions.   Thank you for the opportunity to participate in this patient's care.   Shanita Wicker, LCSW [facility] Hospital Liaison [phone]  

## 2023-04-01 NOTE — Inpatient Diabetes Management (Signed)
Inpatient Diabetes Program Recommendations  AACE/ADA: New Consensus Statement on Inpatient Glycemic Control   Target Ranges:  Prepandial:   less than 140 mg/dL      Peak postprandial:   less than 180 mg/dL (1-2 hours)      Critically ill patients:  140 - 180 mg/dL    Latest Reference Range & Units 04/01/23 08:12 04/01/23 11:40  Glucose-Capillary 70 - 99 mg/dL 811 (H) 572 (H)    Latest Reference Range & Units 03/31/23 08:22 03/31/23 11:51 03/31/23 15:45  Glucose-Capillary 70 - 99 mg/dL 620 (H) 355 (H) 974 (H)   Review of Glycemic Control  Diabetes history: DM2 Outpatient Diabetes medications: None Current orders for Inpatient glycemic control: Novolog 0-9 units TID with meals; Decadron 6 mg Q24H  Inpatient Diabetes Program Recommendations:    Insulin: CBGs today 233 and 244 mg/dl. If Decadron is continued, may want to consider ordering Semglee 5 units Q24H.  Thanks, Orlando Penner, RN, MSN, CDCES Diabetes Coordinator Inpatient Diabetes Program [phone] (Team Pager from 8am to 5pm)

## 2023-04-02 ENCOUNTER — Other Ambulatory Visit (HOSPITAL_COMMUNITY): Payer: Self-pay

## 2023-04-02 DIAGNOSIS — G9340 Encephalopathy, unspecified: Secondary | ICD-10-CM | POA: Diagnosis not present

## 2023-04-02 LAB — CBC WITH DIFFERENTIAL/PLATELET
Abs Immature Granulocytes: 0.67 10*3/uL — ABNORMAL HIGH (ref 0.00–0.07)
Basophils Absolute: 0.1 10*3/uL (ref 0.0–0.1)
Basophils Relative: 1 %
Eosinophils Absolute: 0 10*3/uL (ref 0.0–0.5)
Eosinophils Relative: 0 %
HCT: 27.1 % — ABNORMAL LOW (ref 36.0–46.0)
Hemoglobin: 9.2 g/dL — ABNORMAL LOW (ref 12.0–15.0)
Immature Granulocytes: 6 %
Lymphocytes Relative: 10 %
Lymphs Abs: 1.1 10*3/uL (ref 0.7–4.0)
MCH: 29 pg (ref 26.0–34.0)
MCHC: 33.9 g/dL (ref 30.0–36.0)
MCV: 85.5 fL (ref 80.0–100.0)
Monocytes Absolute: 0.7 10*3/uL (ref 0.1–1.0)
Monocytes Relative: 7 %
Neutro Abs: 8.4 10*3/uL — ABNORMAL HIGH (ref 1.7–7.7)
Neutrophils Relative %: 76 %
Platelets: 322 10*3/uL (ref 150–400)
RBC: 3.17 MIL/uL — ABNORMAL LOW (ref 3.87–5.11)
RDW: 17.8 % — ABNORMAL HIGH (ref 11.5–15.5)
WBC: 11 10*3/uL — ABNORMAL HIGH (ref 4.0–10.5)
nRBC: 0 % (ref 0.0–0.2)

## 2023-04-02 LAB — C-REACTIVE PROTEIN: CRP: 4.8 mg/dL — ABNORMAL HIGH (ref <1.0)

## 2023-04-02 LAB — BRAIN NATRIURETIC PEPTIDE: B Natriuretic Peptide: 424.5 pg/mL — ABNORMAL HIGH (ref 0.0–100.0)

## 2023-04-02 LAB — MAGNESIUM: Magnesium: 2.4 mg/dL (ref 1.7–2.4)

## 2023-04-02 LAB — BASIC METABOLIC PANEL
Anion gap: 5 (ref 5–15)
BUN: 13 mg/dL (ref 8–23)
CO2: 24 mmol/L (ref 22–32)
Calcium: 9.1 mg/dL (ref 8.9–10.3)
Chloride: 108 mmol/L (ref 98–111)
Creatinine, Ser: 0.69 mg/dL (ref 0.44–1.00)
GFR, Estimated: >60 mL/min (ref >60)
Glucose, Bld: 232 mg/dL — ABNORMAL HIGH (ref 70–99)
Potassium: 3.7 mmol/L (ref 3.5–5.1)
Sodium: 137 mmol/L (ref 135–145)

## 2023-04-02 LAB — CULTURE, BLOOD (ROUTINE X 2)

## 2023-04-02 LAB — GLUCOSE, CAPILLARY: Glucose-Capillary: 163 mg/dL — ABNORMAL HIGH (ref 70–99)

## 2023-04-02 MED ORDER — AMOXICILLIN-POT CLAVULANATE 875-125 MG PO TABS
1.0000 | ORAL_TABLET | Freq: Two times a day (BID) | ORAL | 0 refills | Status: DC
  Filled 2023-04-02: qty 8, 4d supply, fill #0

## 2023-04-02 MED ORDER — TAMSULOSIN HCL 0.4 MG PO CAPS
0.4000 mg | ORAL_CAPSULE | Freq: Every day | ORAL | 0 refills | Status: DC
  Filled 2023-04-02: qty 30, 30d supply, fill #0

## 2023-04-02 MED ORDER — PANTOPRAZOLE SODIUM 40 MG PO TBEC
40.0000 mg | DELAYED_RELEASE_TABLET | Freq: Every day | ORAL | 0 refills | Status: DC
  Filled 2023-04-02: qty 30, 30d supply, fill #0

## 2023-04-02 MED ORDER — DEXAMETHASONE 0.5 MG PO TABS
0.5000 mg | ORAL_TABLET | ORAL | 0 refills | Status: AC
  Filled 2023-04-02: qty 7, 14d supply, fill #0

## 2023-04-02 NOTE — TOC Transition Note (Signed)
Transition of Care [facility]) - CM/SW Discharge Note   Patient Details  Name: Amanda Green MRN: [id number] Date of Birth: 1934/12/27  Transition of Care [facility]) CM/SW Contact:  Gordy Clement, RN Phone Number: 04/02/2023, 9:15 AM   Clinical Narrative:     Patient to DC to home today  All DME has been delivered to home by [employer]. There is a question about a Nurse, adult.  CM is following up and will have one delivered to the Home. Home Health will provide SN/PT/OT and an AIDE by Charna Busman has been called for transportation     Final next level of care: Home w Home Health Services Barriers to Discharge: Continued Medical Work up   Patient Goals and CMS Choice CMS Medicare.gov Compare Post Acute Care list provided to:: Patient Choice offered to / list presented to : Adult Children  Discharge Placement                         Discharge Plan and Services Additional resources added to the After Visit Summary for     Discharge Planning Services: CM Consult Post Acute Care Choice: Home Health          DME Arranged: Bedside commode, Gel overlay, Hospital bed, Lightweight manual wheelchair with seat cushion, Other see comment (HOYER LIFT) DME Agency: [facility] Date DME Agency Contacted: 04/01/23 Time DME Agency Contacted: 1135 Representative spoke with at DME Agency: Fayrene Fearing HH Arranged: RN, PT, OT, Nurse's Aide HH Agency: [facility] Date [facility] Agency Contacted: 04/01/23 Time HH Agency Contacted: 1135 Representative spoke with at [facility] Agency: Denyse Amass  Social Determinants of Health (SDOH) Interventions SDOH Screenings   Food Insecurity: No Food Insecurity (03/30/2023)  Housing: Low Risk  (03/30/2023)  Transportation Needs: No Transportation Needs (03/30/2023)  Utilities: Not At Risk (03/30/2023)  Tobacco Use: Low Risk  (03/30/2023)     Readmission Risk Interventions     No data to display

## 2023-04-02 NOTE — Discharge Summary (Signed)
Amanda Green HBZ:[id number] DOB: 12-17-1935 DOA: 03/30/2023  PCP: [name], DO  Admit date: 03/30/2023  Discharge date: 04/02/2023  Admitted From: Home   Disposition:  Home with Pall care >> Hospice if family agrees   Recommendations for Outpatient Follow-up:   Follow up with PCP in 1-2 weeks  PCP Please obtain BMP/CBC, 2 view CXR in 1week,  (see Discharge instructions)   PCP Please follow up on the following pending results: Please encourage the patient and family to consider home hospice if there is further decline, needs close outpatient follow-up with her oncologist as well.   Home Health: PT, RN,    Equipment/Devices: as below  Consultations: Pall.Care Discharge Condition: Guarded CODE STATUS: Full    Diet Recommendation: Heart Healthy     Chief Complaint  Patient presents with   Altered Mental Status     Brief history of present illness from the day of admission and additional interim summary    88 y.o. female with medical history significant of stroke, A-fib, hypertension, diabetes, spinal stenosis, cognitive impairment, cardiac amyloid and diastolic CHF, breast cancer with mets to the brain now with recent decline presenting with increasing lethargy and altered mental status.  The ER workup was suggestive of pneumonia and UTI and she was admitted to the hospital.                                                                  Hospital Course   Acute toxic encephalopathy due to pneumonia and UTI present on admission in a patient with metastatic breast cancer with mets to the brain on chronic decline.  She has currently been admitted to the hospital placed on empiric IV antibiotics, although her long-term prognosis is quite poor and palliative care is on board family still wishes the  patient to be full code, she has been adequately hydrated, so far remained negative as far as all culture and sensitivity are concerned,negative MRSA PCR, responded well to empiric antibiotics now transition to 3 more days of oral Augmentin, is now feeling much better and close to her baseline family wants to take her home, they are still considering for outpatient palliative care and hospice.  Will request PCP to continue to monitor and guide patient towards comfort measures if appropriate in the future.   Stage IV metastatic breast cancer with mets to the brain.  CT of the head noted with some vasogenic edema and midline shift with mets to the left occipital lobe.  She was on steroid taper at home, be continued for another 7 days, it was 2 and few days ago.  Kindly see discussion above.  Palliative care following.   Cardiac amyloid, Diastolic CHF  - EF 60 to 65%, appears compensated at this time, continue home Lasix, spironolactone, losartan,  metoprolol, continue home tafamidis.   History of CVA  - Continue home atorvastatin   Paroxysmal atrial fibrillation [place] vas 2 score of greater than 3.  Currently on rate controlling agents which are beta-blocker and amiodarone, not on anticoagulation due to brain mets and high risk for bleeding.      Cognitive impairment  - Continue home Namenda   DM2  -diet controlled.  Discharge diagnosis     Principal Problem:   Acute encephalopathy Active Problems:   Paroxysmal atrial fibrillation   Cardiac amyloidosis   Controlled type 2 diabetes mellitus without complication, without long-term current use of insulin   Essential hypertension   History of CVA (cerebrovascular accident)   CAP (community acquired pneumonia)    Discharge instructions    Discharge Instructions     Diet - low sodium heart healthy   Complete by: As directed    Discharge instructions   Complete by: As directed    Follow with Primary MD [name], DO and your  Oncologist in 7 days   Get CBC, CMP, 2 view Chest X ray -  checked next visit with your primary MD   Activity: As tolerated with Full fall precautions use walker/cane & assistance as needed  Disposition Home   Diet: Heart Healthy    Special Instructions: If you have smoked or chewed Tobacco  in the last 2 yrs please stop smoking, stop any regular Alcohol  and or any Recreational drug use.  On your next visit with your primary care physician please Get Medicines reviewed and adjusted.  Please request your Prim.MD to go over all Hospital Tests and Procedure/Radiological results at the follow up, please get all Hospital records sent to your Prim MD by signing hospital release before you go home.  If you experience worsening of your admission symptoms, develop shortness of breath, life threatening emergency, suicidal or homicidal thoughts you must seek medical attention immediately by calling 911 or calling your MD immediately  if symptoms less severe.  You Must read complete instructions/literature along with all the possible adverse reactions/side effects for all the Medicines you take and that have been prescribed to you. Take any new Medicines after you have completely understood and accpet all the possible adverse reactions/side effects.   Discharge wound care:   Complete by: As directed    Buttocks Right Stage 2 -  Partial thickness loss of dermis presenting as a shallow open injury with a red, pink wound bed without slough. less than 1cm size. pink/red   Increase activity slowly   Complete by: As directed        Discharge Medications   Allergies as of 04/02/2023       Reactions   Gabapentin Other (See Comments)   Drowsiness        Medication List     STOP taking these medications    magnesium 30 MG tablet   ofloxacin 0.3 % ophthalmic solution Commonly known as: OCUFLOX   prednisoLONE acetate 1 % ophthalmic suspension Commonly known as: PRED FORTE   Rocklatan  0.02-0.005 % Soln Generic drug: Netarsudil-Latanoprost       TAKE these medications    amiodarone 200 MG tablet Commonly known as: PACERONE Take 1 tablet (200 mg total) by mouth daily.   amoxicillin-clavulanate 875-125 MG tablet Commonly known as: AUGMENTIN Take 1 tablet by mouth 2 (two) times daily.   ascorbic acid 500 MG tablet Commonly known as: VITAMIN C Take 500 mg by mouth daily.  atorvastatin 80 MG tablet Commonly known as: LIPITOR Take 80 mg by mouth daily.   brimonidine-timolol 0.2-0.5 % ophthalmic solution Commonly known as: COMBIGAN Place 1 drop into the left eye every 12 (twelve) hours.   calcium carbonate 1500 (600 Ca) MG Tabs tablet Commonly known as: OSCAL Take 1,500 mg by mouth daily with breakfast.   cyanocobalamin 1000 MCG tablet Commonly known as: VITAMIN B12 Take 1,000 mcg by mouth daily.   dexamethasone 0.5 MG tablet Commonly known as: DECADRON Take 1 tablet (0.5 mg total) by mouth every other day for 7 doses.   dorzolamide 2 % ophthalmic solution Commonly known as: TRUSOPT Place 1 drop into the left eye 2 (two) times daily.   furosemide 20 MG tablet Commonly known as: LASIX Take 1 tablet (20 mg total) by mouth daily.   losartan 25 MG tablet Commonly known as: COZAAR Take 1 tablet (25 mg total) by mouth daily.   magnesium oxide 400 (240 Mg) MG tablet Commonly known as: MAG-OX Take 400 mg by mouth daily.   memantine 10 MG tablet Commonly known as: Namenda Take 1 tablet (10 mg total) by mouth 2 (two) times daily.   metoprolol tartrate 50 MG tablet Commonly known as: LOPRESSOR Take 1 tablet (50 mg total) by mouth 2 (two) times daily.   pantoprazole 40 MG tablet Commonly known as: PROTONIX Take 1 tablet (40 mg total) by mouth daily.   spironolactone 25 MG tablet Commonly known as: ALDACTONE Take 0.5 tablets (12.5 mg total) by mouth daily.   tamsulosin 0.4 MG Caps capsule Commonly known as: Flomax Take 1 capsule (0.4 mg total)  by mouth daily after supper.   Vyndamax 61 MG Caps Generic drug: Tafamidis Take 61 mg by mouth daily.               Durable Medical Equipment  (From admission, onward)           Start     Ordered   04/01/23 1131  For home use only DME lightweight manual wheelchair with seat cushion  Once       Comments: Patient suffers from Acute Encephalopthy which impairs their ability to perform daily activities like bathing, dressing, feeding, grooming, and toileting in the home.  A cane, crutch, or walker will not resolve  issue with performing activities of daily living. A wheelchair will allow patient to safely perform daily activities. Patient is not able to propel themselves in the home using a standard weight wheelchair due to arm weakness, endurance, and general weakness. Patient can self propel in the lightweight wheelchair. Length of need Lifetime. Accessories: elevating leg rests (ELRs), wheel locks, extensions and anti-tippers.   04/01/23 1132   04/01/23 1130  For home use only DME Bedside commode  Once       Question:  Patient needs a bedside commode to treat with the following condition  Answer:  Acute encephalopathy   04/01/23 1132   04/01/23 1130  For home use only DME Hospital bed  Once       Question Answer Comment  Length of Need Lifetime   Patient has (list medical condition): Acute Encephalopathy   The above medical condition requires: Patient requires the ability to reposition frequently   Head must be elevated greater than: 45 degrees   Bed type Semi-electric   Hoyer Lift Yes   Support Surface: Gel Overlay      04/01/23 1132              Discharge  Care Instructions  (From admission, onward)           Start     Ordered   04/02/23 0000  Discharge wound care:       Comments: Buttocks Right Stage 2 -  Partial thickness loss of dermis presenting as a shallow open injury with a red, pink wound bed without slough. less than 1cm size. pink/red   04/02/23  0850             Follow-up Information     [facility] Follow up.   Why: [facility] will be contacting you to arrange a home visit Contact information: [address] [facility] [place] [address] [phone]        [facility] Follow up.   Specialty: Home Health Services Why: [facility] will be providing Skilled Nursing, PT, OT and an Aide.  They will contact you to schedule initial home visit within 48 hours of returning home .  Please call them if you have not heard within this time frame Contact information: [address] [place] [address] [phone]         [facility] Follow up.   Why: [facility] is providing your hospital bed,wheelchair,hoyer lift and bedside commode. Contact information: [address] [place] [address] [phone]         [name], DO. Schedule an appointment as soon as possible for a visit in 1 week(s).   Specialty: Internal Medicine Why: and your oncologist in 1 week Contact information: [address] [place] [address] [phone]         Sebastian Ache, MD. Schedule an appointment as soon as possible for a visit in 1 week(s).   Specialty: Urology Why: foley Contact information: [address] [place] [address] [phone]                 Major procedures and Radiology Reports - PLEASE review detailed and final reports thoroughly  -      DG Chest Port 1 View  Result Date: 04/01/2023 CLINICAL DATA:  88 year old female with history of shortness of breath. EXAM: PORTABLE CHEST 1 VIEW COMPARISON:  Chest x-ray 03/30/2023. FINDINGS: Opacity at the left base which may reflect atelectasis and/or consolidation, likely with superimposed small left pleural effusion. Right lung is clear. No right pleural effusion. No pneumothorax. No evidence of pulmonary edema. Heart size is mildly enlarged. The patient is rotated to  the left on today's exam, resulting in distortion of the mediastinal contours and reduced diagnostic sensitivity and specificity for mediastinal pathology. Atherosclerotic calcifications in the thoracic aorta. Surgical clips in the right axillary region likely from prior lymph node dissection. IMPRESSION: 1. Atelectasis and/or consolidation in the left lung base with superimposed small left pleural effusion. 2. Aortic atherosclerosis. Electronically Signed   By: Trudie Reed M.D.   On: 04/01/2023 06:56   CT Head Wo Contrast  Result Date: 03/30/2023 CLINICAL DATA:  Mental status change.  Known CNS neoplasm EXAM: CT HEAD WITHOUT CONTRAST TECHNIQUE: Contiguous axial images were obtained from the base of the skull through the vertex without intravenous contrast. RADIATION DOSE REDUCTION: This exam was performed according to the departmental dose-optimization program which includes automated exposure control, adjustment of the mA and/or kV according to patient size and/or use of iterative reconstruction technique. COMPARISON:  03/11/2023, 02/14/2023 FINDINGS: Brain: Known left occipital lobe mass measuring approximately 3.0 cm with surrounding vasogenic edema. Approximately 4 mm of left-to-right midline shift  is unchanged. Small nodule along the posterior right lateral ventricle is unchanged from prior and favored to represent a focus of gray matter heterotopia (series 3, image 19). No definite new mass lesion identified. No large territory acute infarction. No intracranial hemorrhage or extra-axial collection. No hydrocephalus. Vascular: Atherosclerotic calcifications involving the large vessels of the skull base. No unexpected hyperdense vessel. Skull: Normal. Negative for fracture or focal lesion. Sinuses/Orbits: No acute finding. Other: None. IMPRESSION: 1. Known left occipital lobe mass with surrounding vasogenic edema, similar in appearance to prior. 4 mm of left-to-right midline shift, unchanged from  prior. 2. 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 Electronically Signed   By: Duanne Guess D.O.   On: 03/30/2023 10:41   DG Chest Port 1 View  Result Date: 03/30/2023 CLINICAL DATA:  Possible sepsis. EXAM: PORTABLE CHEST 1 VIEW COMPARISON:  Chest x-rays dated 03/10/2023 03/06/2023. FINDINGS: New bibasilar airspace opacities. Probable small LEFT pleural effusion. No pneumothorax is seen. Heart size and mediastinal contours appear stable. Osseous structures about the chest are unremarkable. IMPRESSION: 1. New bibasilar airspace opacities, suspicious for multifocal pneumonia, alternatively asymmetric edema. 2. Probable small LEFT pleural effusion. Electronically Signed   By: Bary Richard M.D.   On: 03/30/2023 10:19   ECHOCARDIOGRAM COMPLETE  Result Date: 03/21/2023    ECHOCARDIOGRAM REPORT   Patient Name:   CHARRISE LARDNER Date of Exam: 03/21/2023 Medical Rec #:  [id number]          Height:       63.0 in Accession #:    [id number]         Weight:       154.5 lb Date of Birth:  1935-11-02          BSA:          1.733 m Patient Age:    88 years           BP:           117/80 mmHg Patient Gender: F                  HR:           128 bpm. Exam Location:  Inpatient Procedure: 2D Echo, Cardiac Doppler and Color Doppler Indications:    A- fib  History:        Patient has prior history of Echocardiogram examinations, most                 recent 04/15/2021. Arrythmias:Atrial Fibrillation; Risk                 Factors:Hypertension and Diabetes. Hx of amyloidosis.  Sonographer:    Lucy Antigua Referring Phys: [id number] TAYE T GONFA IMPRESSIONS  1. Left ventricular ejection fraction, by estimation, is 60 to 65%. The left ventricle has normal function. The left ventricle has no regional wall motion abnormalities. There is mild concentric left ventricular hypertrophy. Left ventricular diastolic parameters are indeterminate.  Elevated left ventricular end-diastolic pressure.  2. Right ventricular systolic function is normal. The right ventricular size is normal. There is moderately elevated pulmonary artery systolic pressure.  3. Left atrial size was mildly dilated.  4. Moderate pleural effusion in the left lateral region.  5. The mitral valve is normal in structure. Trivial mitral valve regurgitation. No evidence of mitral stenosis.  6. The aortic valve is tricuspid. There is mild calcification of the aortic valve.  There is mild thickening of the aortic valve. Aortic valve regurgitation is not visualized. Aortic valve sclerosis/calcification is present, without any evidence of aortic stenosis. Aortic valve area, by VTI measures 1.99 cm. Aortic valve mean gradient measures 2.0 mmHg. Aortic valve Vmax measures 1.08 m/s.  7. The inferior vena cava is normal in size with greater than 50% respiratory variability, suggesting right atrial pressure of 3 mmHg. FINDINGS  Left Ventricle: Left ventricular ejection fraction, by estimation, is 60 to 65%. The left ventricle has normal function. The left ventricle has no regional wall motion abnormalities. The left ventricular internal cavity size was normal in size. There is  mild concentric left ventricular hypertrophy. Left ventricular diastolic parameters are indeterminate. Elevated left ventricular end-diastolic pressure. Right Ventricle: The right ventricular size is normal. No increase in right ventricular wall thickness. Right ventricular systolic function is normal. There is moderately elevated pulmonary artery systolic pressure. The tricuspid regurgitant velocity is 3.71 m/s, and with an assumed right atrial pressure of 3 mmHg, the estimated right ventricular systolic pressure is 58.1 mmHg. Left Atrium: Left atrial size was mildly dilated. Right Atrium: Right atrial size was normal in size. Pericardium: There is no evidence of pericardial effusion. Mitral Valve: The mitral valve is normal in  structure. There is mild thickening of the mitral valve leaflet(s). Trivial mitral valve regurgitation. No evidence of mitral valve stenosis. Tricuspid Valve: The tricuspid valve is normal in structure. Tricuspid valve regurgitation is mild . No evidence of tricuspid stenosis. Aortic Valve: The aortic valve is tricuspid. There is mild calcification of the aortic valve. There is mild thickening of the aortic valve. Aortic valve regurgitation is not visualized. Aortic valve sclerosis/calcification is present, without any evidence of aortic stenosis. Aortic valve mean gradient measures 2.0 mmHg. Aortic valve peak gradient measures 4.7 mmHg. Aortic valve area, by VTI measures 1.99 cm. Pulmonic Valve: The pulmonic valve was normal in structure. Pulmonic valve regurgitation is not visualized. No evidence of pulmonic stenosis. Aorta: The aortic root is normal in size and structure. Venous: The inferior vena cava is normal in size with greater than 50% respiratory variability, suggesting right atrial pressure of 3 mmHg. IAS/Shunts: No atrial level shunt detected by color flow Doppler. Additional Comments: There is a moderate pleural effusion in the left lateral region.  LEFT VENTRICLE PLAX 2D LVIDd:         3.30 cm   Diastology LVIDs:         2.40 cm   LV e' medial:    5.00 cm/s LV PW:         1.30 cm   LV E/e' medial:  21.8 LV IVS:        1.30 cm   LV e' lateral:   5.77 cm/s LVOT diam:     2.00 cm   LV E/e' lateral: 18.9 LV SV:         46 LV SV Index:   26 LVOT Area:     3.14 cm  RIGHT VENTRICLE RV S prime:     18.10 cm/s TAPSE (M-mode): 1.9 cm LEFT ATRIUM           Index        RIGHT ATRIUM           Index LA Vol (A2C): 17.6 ml 10.16 ml/m  RA Area:     12.50 cm LA Vol (A4C): 53.2 ml 30.70 ml/m  RA Volume:   26.90 ml  15.52 ml/m  AORTIC VALVE AV Area (Vmax):  1.94 cm AV Area (Vmean):   2.00 cm AV Area (VTI):     1.99 cm AV Vmax:           108.00 cm/s AV Vmean:          66.600 cm/s AV VTI:            0.229 m AV  Peak Grad:      4.7 mmHg AV Mean Grad:      2.0 mmHg LVOT Vmax:         66.60 cm/s LVOT Vmean:        42.300 cm/s LVOT VTI:          0.145 m LVOT/AV VTI ratio: 0.63  AORTA Ao Root diam: 2.90 cm Ao Asc diam:  2.80 cm MITRAL VALVE                TRICUSPID VALVE MV Area (PHT): 4.32 cm     TR Peak grad:   55.1 mmHg MV Decel Time: 176 msec     TR Vmax:        371.00 cm/s MV E velocity: 108.83 cm/s MV A velocity: 21.61 cm/s   SHUNTS MV E/A ratio:  5.04         Systemic VTI:  0.14 m                             Systemic Diam: 2.00 cm Chilton Si MD Electronically signed by Chilton Si MD Signature Date/Time: 03/21/2023/1:25:19 PM    Final    VAS [place] LOWER EXTREMITY VENOUS (DVT)  Result Date: 03/18/2023  Lower Venous DVT Study Patient Name:  LALIA LOUDON  Date of Exam:   03/17/2023 Medical Rec #: [id number]           Accession #:    [id number] Date of Birth: 11-Nov-1935           Patient Gender: F Patient Age:   72 years Exam Location:  [facility] Procedure:      VAS [place] LOWER EXTREMITY VENOUS (DVT) Referring Phys: Alwyn Ren GONFA --------------------------------------------------------------------------------  Indications: Edema, SOB, and cardiac amyloidosis, A-fib with RVR.  Risk Factors: Cancer BRCA with brain mets. Limitations: Positioning and body habitus. Comparison Study: No prior study on file Performing Technologist: Sherren Kerns RVS  Examination Guidelines: A complete evaluation includes B-mode imaging, spectral Doppler, color Doppler, and power Doppler as needed of all accessible portions of each vessel. Bilateral testing is considered an integral part of a complete examination. Limited examinations for reoccurring indications may be performed as noted. The reflux portion of the exam is performed with the patient in reverse Trendelenburg.  +---------+---------------+---------+-----------+----------+-------------------+ RIGHT    CompressibilityPhasicitySpontaneityPropertiesThrombus Aging       +---------+---------------+---------+-----------+----------+-------------------+ CFV      Full           Yes      Yes                                      +---------+---------------+---------+-----------+----------+-------------------+ SFJ      Full                                                             +---------+---------------+---------+-----------+----------+-------------------+  FV Prox  Full           Yes      Yes                                      +---------+---------------+---------+-----------+----------+-------------------+ FV Mid                  Yes      Yes                                      +---------+---------------+---------+-----------+----------+-------------------+ FV Distal                                             Not well visualized +---------+---------------+---------+-----------+----------+-------------------+ PFV      Full                                                             +---------+---------------+---------+-----------+----------+-------------------+ POP      Full           Yes      Yes                                      +---------+---------------+---------+-----------+----------+-------------------+ PTV                                                   Not well visualized +---------+---------------+---------+-----------+----------+-------------------+ PERO                                                  Not well visualized +---------+---------------+---------+-----------+----------+-------------------+   +---------+---------------+---------+-----------+----------+-------------------+ LEFT     CompressibilityPhasicitySpontaneityPropertiesThrombus Aging      +---------+---------------+---------+-----------+----------+-------------------+ CFV      Full           Yes      Yes                                      +---------+---------------+---------+-----------+----------+-------------------+  SFJ      Full                                                             +---------+---------------+---------+-----------+----------+-------------------+ FV Prox  Full                                                             +---------+---------------+---------+-----------+----------+-------------------+  FV Mid   Full           Yes      Yes                                      +---------+---------------+---------+-----------+----------+-------------------+ FV DistalFull                                                             +---------+---------------+---------+-----------+----------+-------------------+ PFV      Full                                                             +---------+---------------+---------+-----------+----------+-------------------+ POP                     Yes      Yes                                      +---------+---------------+---------+-----------+----------+-------------------+ PTV                                                   Not well visualized +---------+---------------+---------+-----------+----------+-------------------+ PERO                                                  Not well visualized +---------+---------------+---------+-----------+----------+-------------------+     Summary: BILATERAL: -No evidence of popliteal cyst, bilaterally. RIGHT: - There is no evidence of deep vein thrombosis in the lower extremity. However, portions of this examination were limited- see technologist comments above.  LEFT: - There is no evidence of deep vein thrombosis in the lower extremity. However, portions of this examination were limited- see technologist comments above.  *See table(s) above for measurements and observations. Electronically signed by Sherald Hess MD on 03/18/2023 at 10:52:31 AM.    Final    US RENAL  Result Date: 03/13/2023 CLINICAL DATA:  Acute urinary retention EXAM: RENAL / URINARY TRACT ULTRASOUND  COMPLETE COMPARISON:  02/14/2023 FINDINGS: Right Kidney: Renal measurements: 11.3 x 4.3 x4.2 cm = volume: 107.5 mL. Echogenicity within normal limits. No mass or hydronephrosis visualized. Left Kidney: Renal measurements: 9.1 x 5.1 x 5.1 cm = volume: 124.6 mL. Echogenicity within normal limits. No mass or hydronephrosis visualized. Bladder: Appears normal for degree of bladder distention. Other: Foley in the bladder which is empty IMPRESSION: Negative for hydronephrosis. Foley in the bladder which is empty. Electronically Signed   By: Jasmine Pang M.D.   On: 03/13/2023 20:01   CT HEAD WO CONTRAST ( )  Result Date: 03/11/2023 CLINICAL DATA:  Mental status change, unknown cause rule out brain bleed given known brain mets and altered mental status EXAM: CT HEAD WITHOUT  CONTRAST TECHNIQUE: Contiguous axial images were obtained from the base of the skull through the vertex without intravenous contrast. RADIATION DOSE REDUCTION: This exam was performed according to the departmental dose-optimization program which includes automated exposure control, adjustment of the mA and/or kV according to patient size and/or use of iterative reconstruction technique. COMPARISON:  MRI head February 29, 24. FINDINGS: Brain: Left occipital mass, compatible with known metastasis. No evidence of acute hemorrhage, significant midline shift, hydrocephalus. Patchy white matter hypodensities, nonspecific but compatible with chronic microvascular ischemic disease. Redemonstrated nodular gray matter heterotopia, better characterized on prior MRI. Vascular: Calcific atherosclerosis. Skull: No acute fracture. Sinuses/Orbits: Clear sinuses.  No acute orbital findings. Other: No mastoid effusions. IMPRESSION: 1. Left occipital mass, compatible with known metastasis. MRI with contrast could better characterize. 2. Redemonstrated nodular gray matter heterotopia, better characterized on prior MRI. 3. No evidence of new/interval acute abnormality.  Electronically Signed   By: Feliberto Harts M.D.   On: 03/11/2023 11:51   CT Angio Chest Pulmonary Embolism (PE) W or WO Contrast  Result Date: 03/09/2023 CLINICAL DATA:  Peripheral edema and cough, initial encounter EXAM: CT ANGIOGRAPHY CHEST WITH CONTRAST TECHNIQUE: Multidetector CT imaging of the chest was performed using the standard protocol during bolus administration of intravenous contrast. Multiplanar CT image reconstructions and MIPs were obtained to evaluate the vascular anatomy. RADIATION DOSE REDUCTION: This exam was performed according to the departmental dose-optimization program which includes automated exposure control, adjustment of the mA and/or kV according to patient size and/or use of iterative reconstruction technique. CONTRAST:  75mL OMNIPAQUE IOHEXOL 350 MG/ML SOLN COMPARISON:  Chest x-ray from earlier in the same day. FINDINGS: Cardiovascular: Atherosclerotic calcifications of the thoracic aorta are noted. No aneurysmal dilatation is seen. The degree of enhancement of the aorta is limited precluding evaluation for dissection. Heart is enlarged in size. The pulmonary artery shows a normal branching pattern bilaterally. No filling defect to suggest pulmonary embolism is noted. Mediastinum/Nodes: Thoracic inlet is within normal limits. No hilar or mediastinal adenopathy is noted. The esophagus as visualized is within normal limits. Lungs/Pleura: Mild bibasilar atelectatic changes are noted. No focal infiltrate or sizable effusion is seen. Upper Abdomen: Stable left adrenal adenoma is noted unchanged from previous MRI. No acute abnormality in the upper abdomen is seen. Musculoskeletal: No acute bony abnormality is noted. Degenerative changes of the thoracic spine are seen. Persistent left axillary/lateral breast mass is noted best seen on image 134 measuring up to 2.7 cm. Scattered small axillary lymph nodes are noted stable from the prior exam. Review of the MIP images confirms the above  findings. IMPRESSION: Known left breast/left axillary mass similar to that seen on prior exam. This has been previously biopsied No evidence of pulmonary embolism. Stable left adrenal adenoma Aortic Atherosclerosis (ICD10-I70.0). Electronically Signed   By: Alcide Clever M.D.   On: 03/09/2023 19:03   DG Chest Port 1 View  Result Date: 03/09/2023 CLINICAL DATA:  Cough EXAM: PORTABLE CHEST 1 VIEW COMPARISON:  03/06/2023 FINDINGS: Cardiac silhouette is prominent. Aorta is calcified. There is left base consolidation or volume loss. No pneumothorax. IMPRESSION: Small effusion.  Prominent cardiac silhouette. Electronically Signed   By: Layla Maw M.D.   On: 03/09/2023 13:47   DG Chest 2 View  Result Date: 03/06/2023 CLINICAL DATA:  SOB EXAM: CHEST - 2 VIEW COMPARISON:  04/29/2021. FINDINGS: Cardiac silhouette enlarged. No evidence of pneumothorax or pleural effusion. No evidence of pulmonary edema. Aorta is calcified. There are thoracic degenerative changes. Postop changes right  axilla. IMPRESSION: Mildly enlarged cardiac silhouette.  No focal consolidation. Electronically Signed   By: Layla Maw M.D.   On: 03/06/2023 19:22    Micro Results    Recent Results (from the past 240 hour(s))  Urine Culture     Status: Abnormal   Collection Time: 03/30/23 10:11 AM   Specimen: Urine, Random  Result Value Ref Range Status   Specimen Description URINE, RANDOM  Final   Special Requests   Final    URINE, CLEAN CATCH Performed at [facility], [address] [place], [place] [address]    Culture (A)  Final    >=100,000 COLONIES/mL MULTIPLE SPECIES PRESENT, SUGGEST RECOLLECTION   Report Status 03/31/2023 FINAL  Final  Blood Culture (routine x 2)     Status: None (Preliminary result)   Collection Time: 03/30/23 10:13 AM   Specimen: BLOOD  Result Value Ref Range Status   Specimen Description BLOOD SITE NOT SPECIFIED  Final   Special Requests   Final    BOTTLES DRAWN AEROBIC AND ANAEROBIC  Blood Culture adequate volume   Culture   Final    NO GROWTH 3 DAYS Performed at [facility], [address], [place], [place] [address]    Report Status PENDING  Incomplete  Blood Culture (routine x 2)     Status: None (Preliminary result)   Collection Time: 03/30/23 10:13 AM   Specimen: BLOOD  Result Value Ref Range Status   Specimen Description BLOOD SITE NOT SPECIFIED  Final   Special Requests   Final    BOTTLES DRAWN AEROBIC AND ANAEROBIC Blood Culture adequate volume   Culture   Final    NO GROWTH 3 DAYS Performed at [facility], [address] [place], [place] [address]    Report Status PENDING  Incomplete  Resp panel by RT-PCR (RSV, Flu A&B, Covid) Anterior Nasal Swab     Status: None   Collection Time: 03/30/23  1:08 PM   Specimen: Anterior Nasal Swab  Result Value Ref Range Status   SARS Coronavirus 2 by RT PCR NEGATIVE NEGATIVE Final   Influenza A by PCR NEGATIVE NEGATIVE Final   Influenza B by PCR NEGATIVE NEGATIVE Final    Comment: (NOTE) The Xpert Xpress SARS-CoV-2/FLU/RSV plus assay is intended as an aid in the diagnosis of influenza from Nasopharyngeal swab specimens and should not be used as a sole basis for treatment. Nasal washings and aspirates are unacceptable for Xpert Xpress SARS-CoV-2/FLU/RSV testing.  Fact Sheet for Patients: BloggerCourse.com  Fact Sheet for Healthcare Providers: Serious[place]ker.it  This test is not yet approved or cleared by the [place] FDA and has been authorized for detection and/or diagnosis of SARS-CoV-2 by FDA under an Emergency Use Authorization (EUA). This EUA will remain in effect (meaning this test can be used) for the duration of the COVID-19 declaration under Section 564(b)(1) of the Act, 21 U.S.C. section 360bbb-3(b)(1), unless the authorization is terminated or revoked.     Resp Syncytial Virus by PCR NEGATIVE NEGATIVE Final    Comment:  (NOTE) Fact Sheet for Patients: BloggerCourse.com  Fact Sheet for Healthcare Providers: SeriousBroker.it  This test is not yet approved or cleared by the [place] FDA and has been authorized for detection and/or diagnosis of SARS-CoV-2 by FDA under an Emergency Use Authorization (EUA). This EUA will remain in effect (meaning this test can be used) for the duration of the COVID-19 declaration under Section 564(b)(1) of the Act, 21 U.S.C. section 360bbb-3(b)(1),  unless the authorization is terminated or revoked.  Performed at [facility], [address], [place], [place] [address]   MRSA Next Gen by PCR, Nasal     Status: None   Collection Time: 03/31/23 10:00 AM   Specimen: Nasal Mucosa; Nasal Swab  Result Value Ref Range Status   MRSA by PCR Next Gen NOT DETECTED NOT DETECTED Final    Comment: (NOTE) The GeneXpert MRSA Assay (FDA approved for NASAL specimens only), is one component of a comprehensive MRSA colonization surveillance program. It is not intended to diagnose MRSA infection nor to guide or monitor treatment for MRSA infections. Test performance is not FDA approved in patients less than 62 years old. Performed at [facility], [address], [place], [place] [address]     Today   Subjective    Joud Pettinato today has no headache,no chest abdominal pain,no new weakness tingling or numbness, feels much better wants to go home today.    Objective   Blood pressure (!) 141/76, pulse 61, temperature (!) 97.4 F (36.3 C), temperature source Oral, resp. rate 16, height 5\' 3"  (1.6 m), weight 72 kg, SpO2 97 %.   Intake/Output Summary (Last 24 hours) at 04/02/2023 0851 Last data filed at 04/02/2023 0600 Gross per 24 hour  Intake 1636.47 ml  Output 3100 ml  Net -1463.53 ml    Exam  Awake Alert, No new F.N deficits,    [place].AT,PERRAL Supple Neck,   Symmetrical Chest wall movement, Good air movement  bilaterally, CTAB RRR,No Gallops,   +ve B.Sounds, Abd Soft, Non tender,  No Cyanosis, Clubbing or edema. Foley   Data Review   Recent Labs  Lab 03/30/23 1011 03/31/23 0456 04/01/23 0553 04/02/23 0353  WBC 9.3 7.7 9.1 11.0*  HGB 11.0* 10.3* 9.1* 9.2*  HCT 33.7* 29.6* 27.2* 27.1*  PLT 273 265 278 322  MCV 88.9 85.8 86.9 85.5  MCH 29.0 29.9 29.1 29.0  MCHC 32.6 34.8 33.5 33.9  RDW 18.5* 18.4* 18.1* 17.8*  LYMPHSABS 1.6  --  1.0 1.1  MONOABS 0.9  --  0.6 0.7  EOSABS 0.2  --  0.0 0.0  BASOSABS 0.1  --  0.0 0.1    Recent Labs  Lab 03/30/23 1011 03/30/23 1014 03/30/23 1923 03/31/23 0451 03/31/23 0456 04/01/23 0553 04/02/23 0353  NA 137  --   --   --  137 137 137  K 3.5  --   --   --  3.7 3.9 3.7  CL 103  --   --   --  107 106 108  CO2 28  --   --   --  24 23 24   ANIONGAP 6  --   --   --  6 8 5   GLUCOSE 135*  --   --   --  165* 269* 232*  BUN 8  --   --   --  11 13 13   CREATININE 0.68  --   --   --  0.52 0.62 0.69  AST 16  --   --   --  16  --   --   ALT 19  --   --   --  16  --   --   ALKPHOS 76  --   --   --  67  --   --   BILITOT 0.9  --   --   --  0.7  --   --   ALBUMIN 2.0*  --   --   --  1.7*  --   --   CRP  --  19.8*  --   --   --  10.3* 4.8*  PROCALCITON  --   --  <0.10  --  <0.10  --   --   LATICACIDVEN 1.0  --   --   --   --   --   --   INR 1.3*  --   --   --   --   --   --   BNP 229.3*  --   --  262.7*  --  271.6* 424.5*  MG  --   --  1.9  --   --  1.6* 2.4  CALCIUM 9.5  --   --   --  9.0 9.1 9.1    Total Time in preparing paper work, data evaluation and todays exam - 35 minutes  Signature  -    Susa Raring M.D on 04/02/2023 at 8:51 AM   -  To page go to [email or web address]

## 2023-04-02 NOTE — Discharge Instructions (Signed)
Follow with Primary MD [name], DO and your Oncologist in 7 days   Get CBC, CMP, 2 view Chest X ray -  checked next visit with your primary MD   Activity: As tolerated with Full fall precautions use walker/cane & assistance as needed  Disposition Home   Diet: Heart Healthy    Special Instructions: If you have smoked or chewed Tobacco  in the last 2 yrs please stop smoking, stop any regular Alcohol  and or any Recreational drug use.  On your next visit with your primary care physician please Get Medicines reviewed and adjusted.  Please request your Prim.MD to go over all Hospital Tests and Procedure/Radiological results at the follow up, please get all Hospital records sent to your Prim MD by signing hospital release before you go home.  If you experience worsening of your admission symptoms, develop shortness of breath, life threatening emergency, suicidal or homicidal thoughts you must seek medical attention immediately by calling 911 or calling your MD immediately  if symptoms less severe.  You Must read complete instructions/literature along with all the possible adverse reactions/side effects for all the Medicines you take and that have been prescribed to you. Take any new Medicines after you have completely understood and accpet all the possible adverse reactions/side effects.

## 2023-04-02 NOTE — Plan of Care (Signed)
Discharging home with daughters.

## 2023-04-03 ENCOUNTER — Other Ambulatory Visit: Payer: Self-pay

## 2023-04-03 DIAGNOSIS — C50919 Malignant neoplasm of unspecified site of unspecified female breast: Secondary | ICD-10-CM

## 2023-04-03 NOTE — Progress Notes (Signed)
Referral to palliative care placed per MD request. [facility] of the [place] and spoke with Revonda Standard from new Pt referrals. Revonda Standard states they will call Pt to set up, no need to fax any information as they have everything they need.

## 2023-04-04 ENCOUNTER — Telehealth: Payer: Self-pay | Admitting: Hematology and Oncology

## 2023-04-04 LAB — CULTURE, BLOOD (ROUTINE X 2)
Culture: NO GROWTH
Special Requests: ADEQUATE
Special Requests: ADEQUATE

## 2023-04-06 DIAGNOSIS — N390 Urinary tract infection, site not specified: Secondary | ICD-10-CM | POA: Diagnosis not present

## 2023-04-06 DIAGNOSIS — I480 Paroxysmal atrial fibrillation: Secondary | ICD-10-CM | POA: Diagnosis not present

## 2023-04-06 DIAGNOSIS — I083 Combined rheumatic disorders of mitral, aortic and tricuspid valves: Secondary | ICD-10-CM | POA: Diagnosis not present

## 2023-04-06 DIAGNOSIS — L89312 Pressure ulcer of right buttock, stage 2: Secondary | ICD-10-CM | POA: Diagnosis not present

## 2023-04-06 DIAGNOSIS — I110 Hypertensive heart disease with heart failure: Secondary | ICD-10-CM | POA: Diagnosis not present

## 2023-04-06 DIAGNOSIS — E119 Type 2 diabetes mellitus without complications: Secondary | ICD-10-CM | POA: Diagnosis not present

## 2023-04-06 DIAGNOSIS — C50919 Malignant neoplasm of unspecified site of unspecified female breast: Secondary | ICD-10-CM | POA: Diagnosis not present

## 2023-04-06 DIAGNOSIS — I5032 Chronic diastolic (congestive) heart failure: Secondary | ICD-10-CM | POA: Diagnosis not present

## 2023-04-06 DIAGNOSIS — J189 Pneumonia, unspecified organism: Secondary | ICD-10-CM | POA: Diagnosis not present

## 2023-04-08 ENCOUNTER — Ambulatory Visit
Admission: RE | Admit: 2023-04-08 | Discharge: 2023-04-08 | Disposition: A | Discharge status: Home / Self Care | Payer: Medicare PPO | Source: Ambulatory Visit | Attending: Radiation Oncology | Admitting: Radiation Oncology

## 2023-04-08 DIAGNOSIS — I5032 Chronic diastolic (congestive) heart failure: Secondary | ICD-10-CM | POA: Diagnosis not present

## 2023-04-08 DIAGNOSIS — C50919 Malignant neoplasm of unspecified site of unspecified female breast: Secondary | ICD-10-CM | POA: Diagnosis not present

## 2023-04-08 DIAGNOSIS — N390 Urinary tract infection, site not specified: Secondary | ICD-10-CM | POA: Diagnosis not present

## 2023-04-08 DIAGNOSIS — I110 Hypertensive heart disease with heart failure: Secondary | ICD-10-CM | POA: Diagnosis not present

## 2023-04-08 DIAGNOSIS — J189 Pneumonia, unspecified organism: Secondary | ICD-10-CM | POA: Diagnosis not present

## 2023-04-08 DIAGNOSIS — L89312 Pressure ulcer of right buttock, stage 2: Secondary | ICD-10-CM | POA: Diagnosis not present

## 2023-04-08 DIAGNOSIS — E119 Type 2 diabetes mellitus without complications: Secondary | ICD-10-CM | POA: Diagnosis not present

## 2023-04-08 DIAGNOSIS — I480 Paroxysmal atrial fibrillation: Secondary | ICD-10-CM | POA: Diagnosis not present

## 2023-04-08 DIAGNOSIS — I083 Combined rheumatic disorders of mitral, aortic and tricuspid valves: Secondary | ICD-10-CM | POA: Diagnosis not present

## 2023-04-08 NOTE — Progress Notes (Signed)
  Radiation Oncology         [phone]) [phone] ________________________________  Name: Amanda Green MRN: [id number]  Date of Service: 04/08/2023  DOB: 31-Jan-1935  Post Treatment Telephone Note  Diagnosis:  Metastatic breast cancer with brain and a  Intent: Palliative  Radiation Treatment Dates: 02/21/2023 through 03/06/2023 Site Technique Total Dose (Gy) Dose per Fx (Gy) Completed Fx Beam Energies  Brain: Brain_SRS PTV1_LOccipital_79mm IMRT 27/27 9 3/3 6XFFF  Chest Wall, Left: CW_L axilla 3D 30/30 3 10/10 6X, 10X   (as documented in provider EOT note)   The patient was available for call today.   Symptoms of fatigue have improved since completing therapy.  Symptoms of skin changes have improved since completing therapy.  The patient was encouraged to avoid sun exposure in the area of prior treatment for up to one year following radiation with either sunscreen or by the style of clothing worn in the sun.  The patient has scheduled follow up with her medical oncologist Dr. Al Pimple for ongoing surveillance, and was encouraged to call if she develops concerns or questions regarding radiation.  This concludes the interview.   Ruel Favors, LPN

## 2023-04-09 DIAGNOSIS — N390 Urinary tract infection, site not specified: Secondary | ICD-10-CM | POA: Diagnosis not present

## 2023-04-09 DIAGNOSIS — E119 Type 2 diabetes mellitus without complications: Secondary | ICD-10-CM | POA: Diagnosis not present

## 2023-04-09 DIAGNOSIS — J189 Pneumonia, unspecified organism: Secondary | ICD-10-CM | POA: Diagnosis not present

## 2023-04-09 DIAGNOSIS — I083 Combined rheumatic disorders of mitral, aortic and tricuspid valves: Secondary | ICD-10-CM | POA: Diagnosis not present

## 2023-04-09 DIAGNOSIS — I480 Paroxysmal atrial fibrillation: Secondary | ICD-10-CM | POA: Diagnosis not present

## 2023-04-09 DIAGNOSIS — C50919 Malignant neoplasm of unspecified site of unspecified female breast: Secondary | ICD-10-CM | POA: Diagnosis not present

## 2023-04-09 DIAGNOSIS — I5032 Chronic diastolic (congestive) heart failure: Secondary | ICD-10-CM | POA: Diagnosis not present

## 2023-04-09 DIAGNOSIS — L89312 Pressure ulcer of right buttock, stage 2: Secondary | ICD-10-CM | POA: Diagnosis not present

## 2023-04-09 DIAGNOSIS — I110 Hypertensive heart disease with heart failure: Secondary | ICD-10-CM | POA: Diagnosis not present

## 2023-04-10 DIAGNOSIS — E1169 Type 2 diabetes mellitus with other specified complication: Secondary | ICD-10-CM | POA: Diagnosis not present

## 2023-04-10 DIAGNOSIS — C7931 Secondary malignant neoplasm of brain: Secondary | ICD-10-CM | POA: Diagnosis not present

## 2023-04-10 DIAGNOSIS — R4189 Other symptoms and signs involving cognitive functions and awareness: Secondary | ICD-10-CM | POA: Diagnosis not present

## 2023-04-10 DIAGNOSIS — C50912 Malignant neoplasm of unspecified site of left female breast: Secondary | ICD-10-CM | POA: Diagnosis not present

## 2023-04-10 DIAGNOSIS — I43 Cardiomyopathy in diseases classified elsewhere: Secondary | ICD-10-CM | POA: Diagnosis not present

## 2023-04-10 DIAGNOSIS — E854 Organ-limited amyloidosis: Secondary | ICD-10-CM | POA: Diagnosis not present

## 2023-04-10 DIAGNOSIS — G9341 Metabolic encephalopathy: Secondary | ICD-10-CM | POA: Diagnosis not present

## 2023-04-10 DIAGNOSIS — J189 Pneumonia, unspecified organism: Secondary | ICD-10-CM | POA: Diagnosis not present

## 2023-04-10 DIAGNOSIS — Z978 Presence of other specified devices: Secondary | ICD-10-CM | POA: Diagnosis not present

## 2023-04-11 DIAGNOSIS — C50919 Malignant neoplasm of unspecified site of unspecified female breast: Secondary | ICD-10-CM | POA: Diagnosis not present

## 2023-04-11 DIAGNOSIS — I480 Paroxysmal atrial fibrillation: Secondary | ICD-10-CM | POA: Diagnosis not present

## 2023-04-11 DIAGNOSIS — J189 Pneumonia, unspecified organism: Secondary | ICD-10-CM | POA: Diagnosis not present

## 2023-04-11 DIAGNOSIS — N390 Urinary tract infection, site not specified: Secondary | ICD-10-CM | POA: Diagnosis not present

## 2023-04-11 DIAGNOSIS — L89312 Pressure ulcer of right buttock, stage 2: Secondary | ICD-10-CM | POA: Diagnosis not present

## 2023-04-11 DIAGNOSIS — I083 Combined rheumatic disorders of mitral, aortic and tricuspid valves: Secondary | ICD-10-CM | POA: Diagnosis not present

## 2023-04-11 DIAGNOSIS — I110 Hypertensive heart disease with heart failure: Secondary | ICD-10-CM | POA: Diagnosis not present

## 2023-04-11 DIAGNOSIS — E119 Type 2 diabetes mellitus without complications: Secondary | ICD-10-CM | POA: Diagnosis not present

## 2023-04-11 DIAGNOSIS — I5032 Chronic diastolic (congestive) heart failure: Secondary | ICD-10-CM | POA: Diagnosis not present

## 2023-04-14 NOTE — Assessment & Plan Note (Deleted)
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

## 2023-04-15 ENCOUNTER — Inpatient Hospital Stay: Payer: Medicare PPO | Admitting: Hematology and Oncology

## 2023-04-15 DIAGNOSIS — C50919 Malignant neoplasm of unspecified site of unspecified female breast: Secondary | ICD-10-CM

## 2023-05-09 ENCOUNTER — Ambulatory Visit: Payer: Medicare PPO | Admitting: Neurology

## 2023-06-06 ENCOUNTER — Ambulatory Visit: Payer: Medicare PPO | Admitting: Neurology

## 2023-06-09 NOTE — Progress Notes (Signed)
Patient Care Team: Amanda Decamp, DO as PCP - General (Internal Medicine) Amanda Fair, MD as PCP - Cardiology (Cardiology) Amanda Murphy, MD as Consulting Physician (General Surgery) Levert Feinstein, MD as Consulting Physician (Neurology)  DIAGNOSIS: No diagnosis found.  SUMMARY OF ONCOLOGIC HISTORY: Oncology History  Breast cancer metastasized to brain [facility])  06/10/2019 Initial Diagnosis   Ductal carcinoma in situ (DCIS) of left breast   09/25/2019 Genetic Testing   EPCAM VUS but otherwise negative genetic testing on the common hereditary cancer panel.  The Common Hereditary Gene Panel offered by [facility] includes sequencing and/or deletion duplication testing of the following 48 genes: APC, ATM, AXIN2, BARD1, BMPR1A, BRCA1, BRCA2, BRIP1, CDH1, CDK4, CDKN2A (p14ARF), CDKN2A (p16INK4a), CHEK2, CTNNA1, DICER1, EPCAM (Deletion/duplication testing only), GREM1 (promoter region deletion/duplication testing only), KIT, MEN1, MLH1, MSH2, MSH3, MSH6, MUTYH, NBN, NF1, NHTL1, PALB2, PDGFRA, PMS2, POLD1, POLE, PTEN, RAD50, RAD51C, RAD51D, RNF43, SDHB, SDHC, SDHD, SMAD4, SMARCA4. STK11, TP53, TSC1, TSC2, and VHL.  The following genes were evaluated for sequence changes only: SDHA and HOXB13 c.251G>A variant only. The report date is 09/25/2019.   12/26/2020 Cancer Staging   Staging form: Breast, AJCC 8th Edition - Clinical: Stage 0 (cTis (DCIS), cN0, cM0(i+)) - Signed by Lowella Dell, MD on 12/26/2020     CHIEF COMPLIANT: Follow-up metastatic breast cancer  INTERVAL HISTORY: Amanda Green is a 88 year old with above-mentioned history of DCIS followed by invasive cancer of the breast she had bilateral mastectomies previously. She presents to the clinic for a follow-up.    ALLERGIES:  is allergic to gabapentin.  MEDICATIONS:  Current Outpatient Medications  Medication Sig Dispense Refill   amiodarone (PACERONE) 200 MG tablet Take 1 tablet (200 mg total) by mouth daily. 30 tablet  0   amoxicillin-clavulanate (AUGMENTIN) 875-125 MG tablet Take 1 tablet by mouth 2 (two) times daily. 8 tablet 0   atorvastatin (LIPITOR) 80 MG tablet Take 80 mg by mouth daily.     brimonidine-timolol (COMBIGAN) 0.2-0.5 % ophthalmic solution Place 1 drop into the left eye every 12 (twelve) hours.     calcium carbonate (OSCAL) 1500 (600 Ca) MG TABS tablet Take 1,500 mg by mouth daily with breakfast.     dorzolamide (TRUSOPT) 2 % ophthalmic solution Place 1 drop into the left eye 2 (two) times daily.     furosemide (LASIX) 20 MG tablet Take 1 tablet (20 mg total) by mouth daily. 30 tablet 0   losartan (COZAAR) 25 MG tablet Take 1 tablet (25 mg total) by mouth daily. 30 tablet 0   magnesium oxide (MAG-OX) 400 (240 Mg) MG tablet Take 400 mg by mouth daily.     memantine (NAMENDA) 10 MG tablet Take 1 tablet (10 mg total) by mouth 2 (two) times daily. 180 tablet 3   metoprolol tartrate (LOPRESSOR) 50 MG tablet Take 1 tablet (50 mg total) by mouth 2 (two) times daily. 60 tablet 0   pantoprazole (PROTONIX) 40 MG tablet Take 1 tablet (40 mg total) by mouth daily. 30 tablet 0   spironolactone (ALDACTONE) 25 MG tablet Take 0.5 tablets (12.5 mg total) by mouth daily. 15 tablet 0   Tafamidis (VYNDAMAX) 61 MG CAPS Take 61 mg by mouth daily. 30 capsule 12   tamsulosin (FLOMAX) 0.4 MG CAPS capsule Take 1 capsule (0.4 mg total) by mouth daily after supper. 30 capsule 0   vitamin B-12 (CYANOCOBALAMIN) 1000 MCG tablet Take 1,000 mcg by mouth daily.     vitamin C (ASCORBIC ACID) 500  MG tablet Take 500 mg by mouth daily.     No current facility-administered medications for this visit.    PHYSICAL EXAMINATION: ECOG PERFORMANCE STATUS: {CHL ONC ECOG [phone]}  There were no vitals filed for this visit. There were no vitals filed for this visit.  BREAST:*** No palpable masses or nodules in either right or left breasts. No palpable axillary supraclavicular or infraclavicular adenopathy no breast tenderness  or nipple discharge. (exam performed in the presence of a chaperone)  LABORATORY DATA:  I have reviewed the data as listed    Latest Ref Rng & Units 04/02/2023    3:53 AM 04/01/2023    5:53 AM 03/31/2023    4:56 AM  CMP  Glucose 70 - 99 mg/dL 161  096  045   BUN 8 - 23 mg/dL 13  13  11    Creatinine 0.44 - 1.00 mg/dL 4.09  8.11  9.14   Sodium 135 - 145 mmol/L 137  137  137   Potassium 3.5 - 5.1 mmol/L 3.7  3.9  3.7   Chloride 98 - 111 mmol/L 108  106  107   CO2 22 - 32 mmol/L 24  23  24    Calcium 8.9 - 10.3 mg/dL 9.1  9.1  9.0   Total Protein 6.5 - 8.1 g/dL   4.4   Total Bilirubin 0.3 - 1.2 mg/dL   0.7   Alkaline Phos 38 - 126 U/L   67   AST 15 - 41 U/L   16   ALT 0 - 44 U/L   16     Lab Results  Component Value Date   WBC 11.0 (H) 04/02/2023   HGB 9.2 (L) 04/02/2023   HCT 27.1 (L) 04/02/2023   MCV 85.5 04/02/2023   PLT 322 04/02/2023   NEUTROABS 8.4 (H) 04/02/2023    ASSESSMENT & PLAN:  No problem-specific Assessment & Plan notes found for this encounter.    No orders of the defined types were placed in this encounter.  The patient has a good understanding of the overall plan. she agrees with it. she will call with any problems that may develop before the next visit here. Total time spent: 30 mins including face to face time and time spent for planning, charting and co-ordination of care   Sherlyn Lick, CMA 06/09/23    I Janan Ridge am acting as a Neurosurgeon for [employer]  ***

## 2023-06-11 ENCOUNTER — Inpatient Hospital Stay: Payer: Medicare Other | Attending: Hematology and Oncology | Admitting: Hematology and Oncology

## 2023-06-11 ENCOUNTER — Other Ambulatory Visit: Payer: Self-pay

## 2023-06-11 ENCOUNTER — Encounter: Payer: Self-pay | Admitting: Hematology and Oncology

## 2023-06-11 VITALS — BP 148/88 | HR 83 | Temp 97.7°F | Resp 18 | Ht 63.0 in | Wt 134.5 lb

## 2023-06-11 DIAGNOSIS — C50912 Malignant neoplasm of unspecified site of left female breast: Secondary | ICD-10-CM | POA: Insufficient documentation

## 2023-06-11 DIAGNOSIS — C773 Secondary and unspecified malignant neoplasm of axilla and upper limb lymph nodes: Secondary | ICD-10-CM | POA: Diagnosis present

## 2023-06-11 DIAGNOSIS — C50919 Malignant neoplasm of unspecified site of unspecified female breast: Secondary | ICD-10-CM

## 2023-06-11 DIAGNOSIS — C7931 Secondary malignant neoplasm of brain: Secondary | ICD-10-CM | POA: Diagnosis present

## 2023-06-11 NOTE — Progress Notes (Signed)
START OFF PATHWAY REGIMEN - Breast   [id number]:Trastuzumab and hyaluronidase-oysk 600 mg/10,000 units SUBQ D1 q21 Days:   A cycle is every 21 days:     Trastuzumab and hyaluronidase-oysk   **Always confirm dose/schedule in your pharmacy ordering system**  Patient Characteristics: Distant Metastases or Locoregional Recurrent Disease - Unresected, M0 or Locally Advanced Unresectable Disease Progressing after Neoadjuvant and Local Therapies, M0, HER2 Positive, ER Negative, Chemotherapy, First Line Therapeutic Status: Distant Metastases HER2 Status: Positive (+) ER Status: Negative (-) PR Status: Negative (-) Line of Therapy: First Line Intent of Therapy: Non-Curative / Palliative Intent, Discussed with Patient

## 2023-06-11 NOTE — Assessment & Plan Note (Signed)
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

## 2023-06-12 ENCOUNTER — Other Ambulatory Visit: Payer: Self-pay

## 2023-06-15 ENCOUNTER — Other Ambulatory Visit: Payer: Self-pay

## 2023-06-17 ENCOUNTER — Other Ambulatory Visit: Payer: Self-pay | Admitting: Hematology and Oncology

## 2023-06-17 ENCOUNTER — Other Ambulatory Visit: Payer: Self-pay

## 2023-06-17 DIAGNOSIS — C50919 Malignant neoplasm of unspecified site of unspecified female breast: Secondary | ICD-10-CM

## 2023-06-17 NOTE — Progress Notes (Signed)
cbc

## 2023-06-17 NOTE — Progress Notes (Unsigned)
Cardiology Office Note   Date:  06/19/2023  ID:  Amanda Green, DOB 1935-11-07, MRN [id number] PCP:  [name], DO [facility] Cardiologist: Thurmon Fair, MD  Reason for visit: ~ 1 year follow-up  History of Present Illness    Amanda Green is a 88 y.o. female with a hx of asymptomatic atrial fibrillation, TTR amyloidosis, hypertension, hyperlipidemia, CVA, type 2 diabetes, and breast cancer s/p right mastectomy in 1999, s/p left lumpectomy in 2020 with recurrent disease in left mastectomy and 04/2021, cognitive impairment.  She was diagnosed with atrial fibrillation in April 2022.  Echocardiogram at the time showed severe LVH, mild left atrial dilation, mitral inflow was consistent with impaired relaxation, probable elevated filling pressures.  Follow-up PYP scan was highly suggestive of cardiac amyloidosis.  Myeloma panel was negative and she was started on Vyndamax.   She was last seen by [place] ~1 year ago and was doing okay.  Since that time, she has been diagnosed with breast cancer recurrence with mets to the brain on chronic decline.  She admitted on 03/09/2023 for atrial fibrillation with RVR after presenting with increased shortness of breath and swelling.  She converted to normal sinus with amiodarone.  She was re-admitted for acute toxic encephalopathy due to pneumonia and UTI in April 2024.  She is no longer on anticoagulation due to brain mets and risk of bleeding.  Today, she comes in with her daughter.  Her daughter recounts the last 3 to 4 months.  She states the patient did have home hospice but she continued to improve & was released from hospice.   Her 2 daughters rotate staying with her mother.  They are in the process of getting the patient physical therapy and home health care again.  Patient denies palpitation.  They monitor her vitals at home and heart rates typically in the 60s.  They also monitor her oxygen saturation which stays 91 or higher.   Patient denies shortness of breath, PND and orthopnea.  She has mild ankle swelling.  When reviewing her medication list, the daughter states her tafamidis was discontinued by hospice care.  Her daughter says that patient can feed herself; she is not able to walk.    Since she has improved, she states oncology is planning to start immunotherapy on Tuesday.  Notes mention -  pt breast cancer is HER2 positive, with brain metastasis, indicating aggressive disease.  Objective / Physical Exam   EKG today: Normal sinus rhythm, heart rate 64, anterior infarct, inverted T waves laterally.  Normal QRS and QT.  Vital signs:  BP 126/60   Pulse 64   Ht 5\' 3"  (1.6 m)   Wt 134 lb (60.8 kg)   SpO2 97%   BMI 23.74 kg/m     GEN: No acute distress NECK: No carotid bruits CARDIAC: RRR, no murmurs RESPIRATORY:  Clear to auscultation without rales, wheezing or rhonchi  EXTREMITIES: 1+ ankle edema bilaterally  Assessment and Plan   Wild-type transthyretin related amyloidosis  -Diagnosis following a-fib dx, Echo 03/2021 showed severe LVH, mild left atrial dilation, mitral inflow was consistent with impaired relaxation, probable elevated filling pressures.  Follow-up PYP scan was highly suggestive of cardiac amyloidosis.  Myeloma panel was negative.  -Echo 03/2023: EF 60 to 65%, mild LVH, normal RV systolic function, moderately elevated pulmonary artery pressures, moderate left pleural effusions -Recommend restarting tafamidis if no interactions with her cancer treatment. -Recommend increasing spironolactone 25 mg once daily given mild ankle swelling and  hypokalemia on last blood work.  Kidney function normal. -Continue Lasix 20 mg once daily. -She is getting regular lab work through oncology.    Paroxysmal atrial fibrillation, asymptomatic -EKG today shows normal sinus rhythm. -Continue metoprolol tartrate 50 mg twice daily for rate control. -Decrease amiodarone from 200 mg to 100 mg daily.  Patient has  elevated LFTs.  LFTs checked with her regular oncology blood work.  Her LFTs may be elevated secondary from complications from her cancer/treatment.   -Hoping to keep her in normal sinus rhythm to avoid heart failure exacerbations. -Eliquis discontinued due to risk of bleeding with metastases to the brain.  Disposition -Follow-up with Dr. Royann Shivers in 3 to 4 months.  It seems her bigger goal is oncology treatment for metastatic breast cancer and resuming physical therapy to increase mobility.   Signed, Cannon Kettle, PA-C  06/19/2023 [facility]

## 2023-06-17 NOTE — Progress Notes (Signed)
Pharmacist Chemotherapy Monitoring - Initial Assessment    Anticipated start date: 06/25/23   The following has been reviewed per standard work regarding the patient's treatment regimen: The patient's diagnosis, treatment plan and drug doses, and organ/hematologic function Lab orders and baseline tests specific to treatment regimen  The treatment plan start date, drug sequencing, and pre-medications Prior authorization status  Patient's documented medication list, including drug-drug interaction screen and prescriptions for anti-emetics and supportive care specific to the treatment regimen The drug concentrations, fluid compatibility, administration routes, and timing of the medications to be used The patient's access for treatment and lifetime cumulative dose history, if applicable  The patient's medication allergies and previous infusion related reactions, if applicable   Changes made to treatment plan:  N/A  Follow up needed:  Pending authorization for treatment   Ebony Hail, Pharm.D., CPP 06/17/2023@2 :28 PM

## 2023-06-18 ENCOUNTER — Other Ambulatory Visit: Payer: Self-pay

## 2023-06-18 ENCOUNTER — Ambulatory Visit (HOSPITAL_COMMUNITY)
Admission: RE | Admit: 2023-06-18 | Discharge: 2023-06-18 | Disposition: A | Discharge status: Home / Self Care | Payer: Medicare PPO | Source: Ambulatory Visit | Attending: Hematology and Oncology | Admitting: Hematology and Oncology

## 2023-06-18 ENCOUNTER — Inpatient Hospital Stay: Payer: Medicare PPO | Attending: Hematology and Oncology

## 2023-06-18 DIAGNOSIS — C50919 Malignant neoplasm of unspecified site of unspecified female breast: Secondary | ICD-10-CM | POA: Diagnosis not present

## 2023-06-18 DIAGNOSIS — C7931 Secondary malignant neoplasm of brain: Secondary | ICD-10-CM | POA: Diagnosis not present

## 2023-06-18 DIAGNOSIS — Z9013 Acquired absence of bilateral breasts and nipples: Secondary | ICD-10-CM | POA: Insufficient documentation

## 2023-06-18 DIAGNOSIS — C50912 Malignant neoplasm of unspecified site of left female breast: Secondary | ICD-10-CM | POA: Insufficient documentation

## 2023-06-18 DIAGNOSIS — C773 Secondary and unspecified malignant neoplasm of axilla and upper limb lymph nodes: Secondary | ICD-10-CM | POA: Insufficient documentation

## 2023-06-18 LAB — CBC WITH DIFFERENTIAL (CANCER CENTER ONLY)
Abs Immature Granulocytes: 0.09 10*3/uL — ABNORMAL HIGH (ref 0.00–0.07)
Basophils Absolute: 0.1 10*3/uL (ref 0.0–0.1)
Basophils Relative: 1 %
Eosinophils Absolute: 0.2 10*3/uL (ref 0.0–0.5)
Eosinophils Relative: 3 %
HCT: 31.4 % — ABNORMAL LOW (ref 36.0–46.0)
Hemoglobin: 10.4 g/dL — ABNORMAL LOW (ref 12.0–15.0)
Immature Granulocytes: 1 %
Lymphocytes Relative: 17 %
Lymphs Abs: 1.2 10*3/uL (ref 0.7–4.0)
MCH: 29.8 pg (ref 26.0–34.0)
MCHC: 33.1 g/dL (ref 30.0–36.0)
MCV: 90 fL (ref 80.0–100.0)
Monocytes Absolute: 0.5 10*3/uL (ref 0.1–1.0)
Monocytes Relative: 8 %
Neutro Abs: 4.6 10*3/uL (ref 1.7–7.7)
Neutrophils Relative %: 70 %
Platelet Count: 329 10*3/uL (ref 150–400)
RBC: 3.49 MIL/uL — ABNORMAL LOW (ref 3.87–5.11)
RDW: 19 % — ABNORMAL HIGH (ref 11.5–15.5)
WBC Count: 6.7 10*3/uL (ref 4.0–10.5)
nRBC: 0 % (ref 0.0–0.2)

## 2023-06-18 LAB — CMP (CANCER CENTER ONLY)
ALT: 36 U/L (ref 0–44)
AST: 99 U/L — ABNORMAL HIGH (ref 15–41)
Albumin: 2.7 g/dL — ABNORMAL LOW (ref 3.5–5.0)
Alkaline Phosphatase: 433 U/L — ABNORMAL HIGH (ref 38–126)
Anion gap: 6 (ref 5–15)
BUN: 8 mg/dL (ref 8–23)
CO2: 33 mmol/L — ABNORMAL HIGH (ref 22–32)
Calcium: 9.2 mg/dL (ref 8.9–10.3)
Chloride: 98 mmol/L (ref 98–111)
Creatinine: 0.36 mg/dL — ABNORMAL LOW (ref 0.44–1.00)
GFR, Estimated: >60 mL/min (ref >60)
Glucose, Bld: 112 mg/dL — ABNORMAL HIGH (ref 70–99)
Potassium: 3.1 mmol/L — ABNORMAL LOW (ref 3.5–5.1)
Sodium: 137 mmol/L (ref 135–145)
Total Bilirubin: 0.4 mg/dL (ref 0.3–1.2)
Total Protein: 5.4 g/dL — ABNORMAL LOW (ref 6.5–8.1)

## 2023-06-18 MED ORDER — SODIUM CHLORIDE (PF) 0.9 % IJ SOLN
INTRAMUSCULAR | Status: AC
  Filled 2023-06-18: qty 50

## 2023-06-18 MED ORDER — IOHEXOL 300 MG/ML  SOLN
100.0000 mL | Freq: Once | INTRAMUSCULAR | Status: AC | PRN
  Administered 2023-06-18: 100 mL via INTRAVENOUS

## 2023-06-18 NOTE — Progress Notes (Signed)
Patient Care Team: Ollen Bowl, MD as PCP - General (Internal Medicine) Thurmon Fair, MD as PCP - Cardiology (Cardiology) Luretha Murphy, MD as Consulting Physician (General Surgery) Levert Feinstein, MD as Consulting Physician (Neurology)  DIAGNOSIS:  Encounter Diagnosis  Name Primary?   Malignant neoplasm of breast metastatic to brain, unspecified laterality (HCC) Yes    SUMMARY OF ONCOLOGIC HISTORY: Oncology History  Breast cancer metastasized to brain (HCC)  06/10/2019 Initial Diagnosis   Ductal carcinoma in situ (DCIS) of left breast   09/25/2019 Genetic Testing   EPCAM VUS but otherwise negative genetic testing on the common hereditary cancer panel.  The Common Hereditary Gene Panel offered by [facility] includes sequencing and/or deletion duplication testing of the following 48 genes: APC, ATM, AXIN2, BARD1, BMPR1A, BRCA1, BRCA2, BRIP1, CDH1, CDK4, CDKN2A (p14ARF), CDKN2A (p16INK4a), CHEK2, CTNNA1, DICER1, EPCAM (Deletion/duplication testing only), GREM1 (promoter region deletion/duplication testing only), KIT, MEN1, MLH1, MSH2, MSH3, MSH6, MUTYH, NBN, NF1, NHTL1, PALB2, PDGFRA, PMS2, POLD1, POLE, PTEN, RAD50, RAD51C, RAD51D, RNF43, SDHB, SDHC, SDHD, SMAD4, SMARCA4. STK11, TP53, TSC1, TSC2, and VHL.  The following genes were evaluated for sequence changes only: SDHA and HOXB13 c.251G>A variant only. The report date is 09/25/2019.   12/26/2020 Cancer Staging   Staging form: Breast, AJCC 8th Edition - Clinical: Stage 0 (cTis (DCIS), cN0, cM0(i+)) - Signed by Lowella Dell, MD on 12/26/2020   06/25/2023 -  Chemotherapy   Patient is on Treatment Plan : BREAST MAINTENANCE Trastuzumab IV (6) or SQ (600) D1 q21d x 13 cycles       CHIEF COMPLIANT: Follow-up to discuss treatment plan  INTERVAL HISTORY: Amanda Green is a 88 year old with above-mentioned history of DCIS followed by invasive cancer of the breast she had bilateral mastectomies previously. She presents to the clinic  for a follow-up.  She was recently hospitalized for drowsiness and was treated for infection and a brain MRI was obtained.  The brain MRI showed 3 small new brain metastases.  Today she came in for the Herceptin treatment but the treatment cannot be given because her secondary insurance denied her ability to receive an injection Herceptin.  We are trying to appeal this.   ALLERGIES:  is allergic to neurontin [gabapentin].  MEDICATIONS:  Current Outpatient Medications  Medication Sig Dispense Refill   acetaminophen (TYLENOL) 650 MG CR tablet Take 650-1,300 mg by mouth 2 (two) times daily as needed for pain.     albuterol (PROVENTIL) (2.5 MG/3ML) 0.083% nebulizer solution Take 2.5 mg by nebulization 2 (two) times daily as needed for wheezing or shortness of breath.     amiodarone (PACERONE) 100 MG tablet Take 1 tablet (100 mg total) by mouth daily. 90 tablet 3   ASCORBIC ACID PO Take 1 tablet by mouth daily. Vitamin C, unknown strength.     brimonidine (ALPHAGAN) 0.2 % ophthalmic solution Place 1 drop into both eyes daily.     Cyanocobalamin (VITAMIN B-12 PO) Take 1 tablet by mouth daily.     diclofenac Sodium (VOLTAREN) 1 % GEL Apply 1 Application topically 4 (four) times daily as needed (joint pain).     dorzolamide (TRUSOPT) 2 % ophthalmic solution Place 1 drop into both eyes 2 (two) times daily.     furosemide (LASIX) 20 MG tablet Take 1 tablet (20 mg total) by mouth daily. 30 tablet 0   hydrocerin (EUCERIN) CREA Apply 1 Application topically daily. Apply to left leg and heel once daily, do not apply between digits 113 g 0  hydrochlorothiazide (HYDRODIURIL) 12.5 MG tablet Take 12.5 mg by mouth daily.     leptospermum manuka honey (MEDIHONEY) PSTE paste Apply 1 Application topically daily. Apply to right heel Unstageable PI and sacral stage 2 PI after cleansing Apply thin layer (3 mm) to wound. 30 mL 0   losartan (COZAAR) 25 MG tablet Take 1 tablet (25 mg total) by mouth daily. 30 tablet 0    MAGNESIUM OXIDE PO Take 1 tablet by mouth daily.     memantine (NAMENDA) 10 MG tablet Take 1 tablet (10 mg total) by mouth 2 (two) times daily. 180 tablet 3   metoprolol tartrate (LOPRESSOR) 50 MG tablet Take 1 tablet (50 mg total) by mouth 2 (two) times daily. 180 tablet 3   Multiple Vitamins-Minerals (MULTIVITAMIN WOMEN 50+) TABS Take 1 tablet by mouth daily.     spironolactone (ALDACTONE) 25 MG tablet Take 1 tablet (25 mg total) by mouth daily. 30 tablet 5   Tafamidis (VYNDAMAX) 61 MG CAPS Take 61 mg by mouth daily. 30 capsule 12   TURMERIC CURCUMIN PO Take 1 tablet by mouth daily.     No current facility-administered medications for this visit.    PHYSICAL EXAMINATION: ECOG PERFORMANCE STATUS: 2 - Symptomatic, <50% confined to bed  Vitals:   06/25/23 1347  BP: 133/73  Pulse: 68  Resp: 19  Temp: (!) 97 F (36.1 C)  SpO2: 99%   Filed Weights   06/25/23 1347  Weight: 142 lb 4.8 oz (64.5 kg)     LABORATORY DATA:  I have reviewed the data as listed    Latest Ref Rng & Units 06/25/2023    1:12 PM 06/22/2023    2:53 AM 06/21/2023    3:13 PM  CMP  Glucose 70 - 99 mg/dL 841  96  91   BUN 8 - 23 mg/dL 8  6  10    Creatinine 0.44 - 1.00 mg/dL 3.24  4.01  0.27   Sodium 135 - 145 mmol/L 138  137  141   Potassium 3.5 - 5.1 mmol/L 4.0  3.7  3.9   Chloride 98 - 111 mmol/L 102  103  98   CO2 22 - 32 mmol/L 29  27  32   Calcium 8.9 - 10.3 mg/dL 25.3  9.1  66.4   Total Protein 6.5 - 8.1 g/dL 5.7   5.7   Total Bilirubin 0.3 - 1.2 mg/dL 0.4   0.7   Alkaline Phos 38 - 126 U/L 427   384   AST 15 - 41 U/L 110   107   ALT 0 - 44 U/L 39   36     Lab Results  Component Value Date   WBC 6.4 06/25/2023   HGB 10.0 (L) 06/25/2023   HCT 30.9 (L) 06/25/2023   MCV 91.4 06/25/2023   PLT 320 06/25/2023   NEUTROABS 4.4 06/25/2023    ASSESSMENT & PLAN:  Breast cancer metastasized to brain [facility]) 1999: Right mastectomy 03/04/2019: Left breast DCIS grade 3 ER positive PR negative, left lumpectomy:  DCIS 2.7 cm tamoxifen discontinued April 2022, genetics negative 05/10/2021: Left mastectomy ID[facility] ER/PR negative Ki67 15% HER2 positive 02/14/2023: Left occipital lobe brain metastases 3.1 cm, 9 mm right lateral ventricle mass 03/04/2023: SRS to brain 03/09/2023: Hospitalization for A-fib, CT PE protocol: Normal left breast/left axillary mass April 2024: Axillary lymph node biopsy: ER 0% PR 0% HER2 3+ positive 03/30/2023-04/02/2023: Hospitalization for lethargy, altered mental status, pneumonia and UTI 06/21/2023-06/23/2023: Hospitalization: Acute metabolic encephalopathy (UTI  versus brain mets) -------------------------------------------------------------------------------------------------------------------------- Current treatment: Herceptin Hylecta injections: It appears that her secondary insurance did not approve these injections.  We will try and appeal this and see if we can get it approved.  Until then we cannot proceed with the treatment.  Brain metastases: Will request radiation oncology to see her for stereotactic radiosurgery.    Return to clinic to start Herceptin Hylecta injections as soon as it gets approved.  No orders of the defined types were placed in this encounter.  The patient has a good understanding of the overall plan. she agrees with it. she will call with any problems that may develop before the next visit here. Total time spent: 30 mins including face to face time and time spent for planning, charting and co-ordination of care   Tamsen Meek, MD 06/25/23    I Janan Ridge am acting as a Neurosurgeon for [employer]  I have reviewed the above documentation for accuracy and completeness, and I agree with the above.

## 2023-06-19 ENCOUNTER — Telehealth: Payer: Self-pay | Admitting: Physician Assistant

## 2023-06-19 ENCOUNTER — Ambulatory Visit: Payer: Medicare PPO | Attending: Physician Assistant | Admitting: Physician Assistant

## 2023-06-19 ENCOUNTER — Encounter: Payer: Self-pay | Admitting: Physician Assistant

## 2023-06-19 VITALS — BP 126/60 | HR 64 | Ht 63.0 in | Wt 134.0 lb

## 2023-06-19 DIAGNOSIS — E8582 Wild-type transthyretin-related (ATTR) amyloidosis: Secondary | ICD-10-CM | POA: Diagnosis not present

## 2023-06-19 DIAGNOSIS — I480 Paroxysmal atrial fibrillation: Secondary | ICD-10-CM

## 2023-06-19 MED ORDER — SPIRONOLACTONE 25 MG PO TABS: 25.0000 mg | ORAL_TABLET | Freq: Every day | ORAL | 5 refills | Status: DC

## 2023-06-19 MED ORDER — AMIODARONE HCL 100 MG PO TABS: 100.0000 mg | ORAL_TABLET | Freq: Every day | ORAL | 3 refills | Status: DC

## 2023-06-19 MED ORDER — METOPROLOL TARTRATE 50 MG PO TABS: 50.0000 mg | ORAL_TABLET | Freq: Two times a day (BID) | ORAL | 3 refills | Status: DC

## 2023-06-19 NOTE — Telephone Encounter (Signed)
Pt's daughter called back to state that previous call can be disregarded. She found out what medication it was that pt needed. Please advise    *STAT* If patient is at the pharmacy, call can be transferred to refill team.   1. Which medications need to be refilled? (please list name of each medication and dose if known) metoprolol tartrate (LOPRESSOR) 50 MG   2. Which pharmacy/location (including street and city if local pharmacy) is medication to be sent to?[facility] - [place], [place] - [address]   3. Do they need a 30 day or 90 day supply? 90 day   Pt is out of medication

## 2023-06-19 NOTE — Telephone Encounter (Signed)
Hello Daughter Massie Bougie Just left appointment wife her mom. She want to know if Lalla Brothers could give her a call because she has some questions about medication. She can be reached at [phone]

## 2023-06-19 NOTE — Patient Instructions (Addendum)
Medication Instructions:  Your physician has recommended you make the following change in your medication:  RESTART: Tafamidis (Vyndamax)  INCREASE: Spironolactone (Aldactone) 25 mg once daily DECREASE: Amiodarone (Pacerone) 100 mg once daily (follow liver enzyme)  *If you need a refill on your cardiac medications before your next appointment, please call your pharmacy*   Lab Work: None   Testing/Procedures: None   Follow-Up: At [facility], you and your health needs are our priority.  As part of our continuing mission to provide you with exceptional heart care, we have created designated Provider Care Teams.  These Care Teams include your primary Cardiologist (physician) and Advanced Practice Providers (APPs -  Physician Assistants and Nurse Practitioners) who all work together to provide you with the care you need, when you need it.   Your next appointment:   3-4 month(s)  Provider:   Thurmon Fair, MD

## 2023-06-19 NOTE — Telephone Encounter (Signed)
Pt's medication was sent to pt's pharmacy as requested. Confirmation received.  °

## 2023-06-20 ENCOUNTER — Other Ambulatory Visit: Payer: Self-pay

## 2023-06-21 ENCOUNTER — Emergency Department (HOSPITAL_COMMUNITY): Payer: Medicare PPO

## 2023-06-21 ENCOUNTER — Other Ambulatory Visit: Payer: Self-pay

## 2023-06-21 ENCOUNTER — Inpatient Hospital Stay (HOSPITAL_COMMUNITY)
Admission: EM | Admit: 2023-06-21 | Discharge: 2023-06-23 | DRG: 070 | Disposition: A | Discharge status: Home Health | Payer: Medicare PPO | Attending: Student in an Organized Health Care Education/Training Program | Admitting: Student in an Organized Health Care Education/Training Program

## 2023-06-21 ENCOUNTER — Encounter (HOSPITAL_COMMUNITY): Payer: Self-pay

## 2023-06-21 DIAGNOSIS — I4891 Unspecified atrial fibrillation: Secondary | ICD-10-CM | POA: Diagnosis not present

## 2023-06-21 DIAGNOSIS — I43 Cardiomyopathy in diseases classified elsewhere: Secondary | ICD-10-CM | POA: Diagnosis present

## 2023-06-21 DIAGNOSIS — Z9013 Acquired absence of bilateral breasts and nipples: Secondary | ICD-10-CM | POA: Diagnosis not present

## 2023-06-21 DIAGNOSIS — C7931 Secondary malignant neoplasm of brain: Secondary | ICD-10-CM | POA: Diagnosis present

## 2023-06-21 DIAGNOSIS — Z923 Personal history of irradiation: Secondary | ICD-10-CM

## 2023-06-21 DIAGNOSIS — E876 Hypokalemia: Secondary | ICD-10-CM

## 2023-06-21 DIAGNOSIS — Z800 Family history of malignant neoplasm of digestive organs: Secondary | ICD-10-CM

## 2023-06-21 DIAGNOSIS — D638 Anemia in other chronic diseases classified elsewhere: Secondary | ICD-10-CM | POA: Diagnosis present

## 2023-06-21 DIAGNOSIS — E854 Organ-limited amyloidosis: Secondary | ICD-10-CM | POA: Diagnosis present

## 2023-06-21 DIAGNOSIS — G936 Cerebral edema: Secondary | ICD-10-CM | POA: Diagnosis present

## 2023-06-21 DIAGNOSIS — N3000 Acute cystitis without hematuria: Secondary | ICD-10-CM

## 2023-06-21 DIAGNOSIS — Z79899 Other long term (current) drug therapy: Secondary | ICD-10-CM | POA: Diagnosis not present

## 2023-06-21 DIAGNOSIS — R531 Weakness: Principal | ICD-10-CM

## 2023-06-21 DIAGNOSIS — E785 Hyperlipidemia, unspecified: Secondary | ICD-10-CM | POA: Diagnosis present

## 2023-06-21 DIAGNOSIS — D496 Neoplasm of unspecified behavior of brain: Secondary | ICD-10-CM | POA: Diagnosis present

## 2023-06-21 DIAGNOSIS — R748 Abnormal levels of other serum enzymes: Secondary | ICD-10-CM | POA: Diagnosis present

## 2023-06-21 DIAGNOSIS — I480 Paroxysmal atrial fibrillation: Secondary | ICD-10-CM | POA: Diagnosis present

## 2023-06-21 DIAGNOSIS — D649 Anemia, unspecified: Secondary | ICD-10-CM | POA: Diagnosis not present

## 2023-06-21 DIAGNOSIS — I5032 Chronic diastolic (congestive) heart failure: Secondary | ICD-10-CM | POA: Diagnosis present

## 2023-06-21 DIAGNOSIS — Z7401 Bed confinement status: Secondary | ICD-10-CM

## 2023-06-21 DIAGNOSIS — C50919 Malignant neoplasm of unspecified site of unspecified female breast: Secondary | ICD-10-CM | POA: Diagnosis not present

## 2023-06-21 DIAGNOSIS — C50912 Malignant neoplasm of unspecified site of left female breast: Secondary | ICD-10-CM | POA: Diagnosis present

## 2023-06-21 DIAGNOSIS — Z1501 Genetic susceptibility to malignant neoplasm of breast: Secondary | ICD-10-CM | POA: Diagnosis not present

## 2023-06-21 DIAGNOSIS — I110 Hypertensive heart disease with heart failure: Secondary | ICD-10-CM | POA: Diagnosis present

## 2023-06-21 DIAGNOSIS — C787 Secondary malignant neoplasm of liver and intrahepatic bile duct: Secondary | ICD-10-CM | POA: Diagnosis present

## 2023-06-21 DIAGNOSIS — Z66 Do not resuscitate: Secondary | ICD-10-CM | POA: Diagnosis present

## 2023-06-21 DIAGNOSIS — Z888 Allergy status to other drugs, medicaments and biological substances status: Secondary | ICD-10-CM

## 2023-06-21 DIAGNOSIS — J189 Pneumonia, unspecified organism: Secondary | ICD-10-CM

## 2023-06-21 DIAGNOSIS — R4189 Other symptoms and signs involving cognitive functions and awareness: Secondary | ICD-10-CM | POA: Diagnosis present

## 2023-06-21 DIAGNOSIS — Z1152 Encounter for screening for COVID-19: Secondary | ICD-10-CM

## 2023-06-21 DIAGNOSIS — L89610 Pressure ulcer of right heel, unstageable: Secondary | ICD-10-CM | POA: Diagnosis present

## 2023-06-21 DIAGNOSIS — L89612 Pressure ulcer of right heel, stage 2: Secondary | ICD-10-CM

## 2023-06-21 DIAGNOSIS — G9341 Metabolic encephalopathy: Secondary | ICD-10-CM | POA: Diagnosis present

## 2023-06-21 DIAGNOSIS — L89152 Pressure ulcer of sacral region, stage 2: Secondary | ICD-10-CM | POA: Diagnosis present

## 2023-06-21 DIAGNOSIS — Z8249 Family history of ischemic heart disease and other diseases of the circulatory system: Secondary | ICD-10-CM

## 2023-06-21 DIAGNOSIS — E119 Type 2 diabetes mellitus without complications: Secondary | ICD-10-CM

## 2023-06-21 DIAGNOSIS — R5381 Other malaise: Secondary | ICD-10-CM

## 2023-06-21 DIAGNOSIS — E8582 Wild-type transthyretin-related (ATTR) amyloidosis: Secondary | ICD-10-CM | POA: Diagnosis not present

## 2023-06-21 DIAGNOSIS — L89629 Pressure ulcer of left heel, unspecified stage: Secondary | ICD-10-CM | POA: Diagnosis present

## 2023-06-21 DIAGNOSIS — Z808 Family history of malignant neoplasm of other organs or systems: Secondary | ICD-10-CM

## 2023-06-21 DIAGNOSIS — Z8673 Personal history of transient ischemic attack (TIA), and cerebral infarction without residual deficits: Secondary | ICD-10-CM

## 2023-06-21 DIAGNOSIS — I10 Essential (primary) hypertension: Secondary | ICD-10-CM | POA: Diagnosis not present

## 2023-06-21 LAB — COMPREHENSIVE METABOLIC PANEL
ALT: 36 U/L (ref 0–44)
AST: 107 U/L — ABNORMAL HIGH (ref 15–41)
Albumin: 2.2 g/dL — ABNORMAL LOW (ref 3.5–5.0)
Alkaline Phosphatase: 384 U/L — ABNORMAL HIGH (ref 38–126)
Anion gap: 11 (ref 5–15)
BUN: 10 mg/dL (ref 8–23)
CO2: 32 mmol/L (ref 22–32)
Calcium: 10.1 mg/dL (ref 8.9–10.3)
Chloride: 98 mmol/L (ref 98–111)
Creatinine, Ser: 0.55 mg/dL (ref 0.44–1.00)
GFR, Estimated: >60 mL/min (ref >60)
Glucose, Bld: 91 mg/dL (ref 70–99)
Potassium: 3.9 mmol/L (ref 3.5–5.1)
Sodium: 141 mmol/L (ref 135–145)
Total Bilirubin: 0.7 mg/dL (ref 0.3–1.2)
Total Protein: 5.7 g/dL — ABNORMAL LOW (ref 6.5–8.1)

## 2023-06-21 LAB — TROPONIN I (HIGH SENSITIVITY)
Troponin I (High Sensitivity): 17 ng/L (ref <18)
Troponin I (High Sensitivity): 17 ng/L (ref <18)

## 2023-06-21 LAB — URINALYSIS, ROUTINE W REFLEX MICROSCOPIC
Bilirubin Urine: NEGATIVE
Glucose, UA: NEGATIVE mg/dL
Hgb urine dipstick: NEGATIVE
Ketones, ur: NEGATIVE mg/dL
Nitrite: NEGATIVE
Protein, ur: NEGATIVE mg/dL
Specific Gravity, Urine: 1.002 — ABNORMAL LOW (ref 1.005–1.030)
pH: 8 (ref 5.0–8.0)

## 2023-06-21 LAB — CBC
HCT: 29.4 % — ABNORMAL LOW (ref 36.0–46.0)
Hemoglobin: 9.5 g/dL — ABNORMAL LOW (ref 12.0–15.0)
MCH: 29.4 pg (ref 26.0–34.0)
MCHC: 32.3 g/dL (ref 30.0–36.0)
MCV: 91 fL (ref 80.0–100.0)
Platelets: 378 10*3/uL (ref 150–400)
RBC: 3.23 MIL/uL — ABNORMAL LOW (ref 3.87–5.11)
RDW: 19.3 % — ABNORMAL HIGH (ref 11.5–15.5)
WBC: 8 10*3/uL (ref 4.0–10.5)
nRBC: 0 % (ref 0.0–0.2)

## 2023-06-21 LAB — GLUCOSE, CAPILLARY
Glucose-Capillary: 63 mg/dL — ABNORMAL LOW (ref 70–99)
Glucose-Capillary: 65 mg/dL — ABNORMAL LOW (ref 70–99)
Glucose-Capillary: 71 mg/dL (ref 70–99)

## 2023-06-21 LAB — LACTIC ACID, PLASMA
Lactic Acid, Venous: 1.2 mmol/L (ref 0.5–1.9)
Lactic Acid, Venous: 1.3 mmol/L (ref 0.5–1.9)

## 2023-06-21 LAB — PROTIME-INR
INR: 1.1 (ref 0.8–1.2)
Prothrombin Time: 14.8 seconds (ref 11.4–15.2)

## 2023-06-21 LAB — BRAIN NATRIURETIC PEPTIDE: B Natriuretic Peptide: 267.3 pg/mL — ABNORMAL HIGH (ref 0.0–100.0)

## 2023-06-21 LAB — MAGNESIUM: Magnesium: 1.9 mg/dL (ref 1.7–2.4)

## 2023-06-21 LAB — SARS CORONAVIRUS 2 BY RT PCR: SARS Coronavirus 2 by RT PCR: NEGATIVE

## 2023-06-21 MED ORDER — LACTATED RINGERS IV BOLUS
500.0000 mL | Freq: Once | INTRAVENOUS | Status: AC
  Administered 2023-06-21: 500 mL via INTRAVENOUS

## 2023-06-21 MED ORDER — ACETAMINOPHEN 650 MG RE SUPP: 650.0000 mg | Freq: Four times a day (QID) | RECTAL | Status: DC | PRN

## 2023-06-21 MED ORDER — SODIUM CHLORIDE 0.9 % IV SOLN
1.0000 g | INTRAVENOUS | Status: DC
  Administered 2023-06-21: 1 g via INTRAVENOUS
  Filled 2023-06-21: qty 10

## 2023-06-21 MED ORDER — ENOXAPARIN SODIUM 40 MG/0.4ML IJ SOSY
40.0000 mg | PREFILLED_SYRINGE | INTRAMUSCULAR | Status: DC
  Administered 2023-06-21 – 2023-06-22 (×2): 40 mg via SUBCUTANEOUS
  Filled 2023-06-21 (×2): qty 0.4

## 2023-06-21 MED ORDER — LACTATED RINGERS IV SOLN: INTRAVENOUS | Status: AC

## 2023-06-21 MED ORDER — SODIUM CHLORIDE 0.9 % IV BOLUS
1000.0000 mL | Freq: Once | INTRAVENOUS | Status: AC
  Administered 2023-06-21: 1000 mL via INTRAVENOUS

## 2023-06-21 MED ORDER — SODIUM CHLORIDE 0.9 % IV SOLN: 500.0000 mg | Freq: Once | INTRAVENOUS | Status: DC

## 2023-06-21 MED ORDER — PIPERACILLIN-TAZOBACTAM 3.375 G IVPB 30 MIN
3.3750 g | Freq: Once | INTRAVENOUS | Status: AC
  Administered 2023-06-21: 3.375 g via INTRAVENOUS
  Filled 2023-06-21: qty 50

## 2023-06-21 MED ORDER — METOPROLOL TARTRATE 25 MG PO TABS
50.0000 mg | ORAL_TABLET | Freq: Two times a day (BID) | ORAL | Status: DC
  Administered 2023-06-21 – 2023-06-23 (×4): 50 mg via ORAL
  Filled 2023-06-21 (×4): qty 2

## 2023-06-21 MED ORDER — HYDROCHLOROTHIAZIDE 12.5 MG PO TABS
12.5000 mg | ORAL_TABLET | Freq: Every day | ORAL | Status: DC
  Administered 2023-06-22 – 2023-06-23 (×2): 12.5 mg via ORAL
  Filled 2023-06-21 (×2): qty 1

## 2023-06-21 MED ORDER — SODIUM CHLORIDE 0.9 % IV SOLN: 1.0000 g | Freq: Once | INTRAVENOUS | Status: DC

## 2023-06-21 MED ORDER — ACETAMINOPHEN 325 MG PO TABS
650.0000 mg | ORAL_TABLET | Freq: Four times a day (QID) | ORAL | Status: DC | PRN
  Administered 2023-06-22 – 2023-06-23 (×2): 650 mg via ORAL
  Filled 2023-06-21 (×2): qty 2

## 2023-06-21 NOTE — ED Triage Notes (Signed)
Weakness for several days, somnolent and lethargic per family. Large CVA last year. A&ox3 for EMS. Indwelling foley

## 2023-06-21 NOTE — Hospital Course (Signed)
Amanda Green is a 88 yo female with previous CVA, diabetes, hypertension, breast cancer s.p double mastectomy and radiation  Presents with generalized weakness. Has a chronic foley (is bedbound. More lethargic the last 2 days. No fever. Foley was changed. Has chronic wound on R foot that is fine. Cxr questionable for pneumonia - no respiratory sxs, cough, sputum production.   7/6 - unsure why she is in hospital, thinks she has been here couple of days - work up anemia: retic, etc  Change uti to Pyuria as dx - foley was for urinary retention btw

## 2023-06-21 NOTE — ED Provider Notes (Signed)
[place] EMERGENCY DEPARTMENT AT [facility] Provider Note   CSN: [id number] Arrival date & time: 06/21/23  1221     History  Chief Complaint  Patient presents with   Weakness    Laurenashley Nakagawa is a 88 y.o. female with past medical history previous CVA, diabetes, hypertension, hyperlipidemia, breast cancer status post double mastectomy and radiation treatment pending chemotherapy infusions starting next week presents to the ED via EMS for evaluation of generalized weakness.  History also obtained from family members at bedside.  They state that patient has been very weak since yesterday.  At baseline, she is stretcher bound and family and nursing staff assist her with daily task including moving around but typically she is able to move all of her extremities and interactive.  Family notes that this morning patient had difficulty even holding a coffee cup with shakiness in both of her arms when attempting to move around.  Also states that she has been sleepier than normal.  Patient states that she has pain to her right heel where she has a wound currently being treated.  She denies chest pain, abdominal pain, headache, or any other pain.  Patient/daughters at bedside deny recent nausea, vomiting, diarrhea.  Patient not currently on antibiotics.  No recent falls or injuries.  They state patient has been eating normally.  EMS that the patient is a full code.      Home Medications Prior to Admission medications   Medication Sig Start Date End Date Taking? Authorizing Provider  amiodarone (PACERONE) 100 MG tablet Take 1 tablet (100 mg total) by mouth daily. 06/19/23   Cannon Kettle, PA-C  amoxicillin-clavulanate (AUGMENTIN) 875-125 MG tablet Take 1 tablet by mouth 2 (two) times daily. 04/02/23   Leroy Sea, MD  atorvastatin (LIPITOR) 80 MG tablet Take 80 mg by mouth daily.    [provider]  brimonidine-timolol (COMBIGAN) 0.2-0.5 % ophthalmic solution Place 1  drop into the left eye every 12 (twelve) hours.    [provider]  calcium carbonate (OSCAL) 1500 (600 Ca) MG TABS tablet Take 1,500 mg by mouth daily with breakfast.    [provider]  dorzolamide (TRUSOPT) 2 % ophthalmic solution Place 1 drop into the left eye 2 (two) times daily. 10/19/20   [provider]  furosemide (LASIX) 20 MG tablet Take 1 tablet (20 mg total) by mouth daily. 03/26/23 04/25/23  Arrien, York Ram, MD  losartan (COZAAR) 25 MG tablet Take 1 tablet (25 mg total) by mouth daily. 03/26/23 04/25/23  Arrien, York Ram, MD  magnesium oxide (MAG-OX) 400 (240 Mg) MG tablet Take 400 mg by mouth daily.    [provider]  memantine (NAMENDA) 10 MG tablet Take 1 tablet (10 mg total) by mouth 2 (two) times daily. 02/04/23   Ihor Austin, NP  metoprolol tartrate (LOPRESSOR) 50 MG tablet Take 1 tablet (50 mg total) by mouth 2 (two) times daily. 06/19/23   Cannon Kettle, PA-C  pantoprazole (PROTONIX) 40 MG tablet Take 1 tablet (40 mg total) by mouth daily. 04/02/23   Leroy Sea, MD  spironolactone (ALDACTONE) 25 MG tablet Take 1 tablet (25 mg total) by mouth daily. 06/19/23 09/17/23  Cannon Kettle, PA-C  Tafamidis (VYNDAMAX) 61 MG CAPS Take 61 mg by mouth daily. 04/27/22   Croitoru, Mihai, MD  tamsulosin (FLOMAX) 0.4 MG CAPS capsule Take 1 capsule (0.4 mg total) by mouth daily after supper. 04/02/23   Leroy Sea, MD  vitamin B-12 (CYANOCOBALAMIN) 1000 MCG tablet Take 1,000 mcg by mouth daily.    [provider]  vitamin C (ASCORBIC ACID) 500 MG tablet Take 500 mg by mouth daily.    [provider]      Allergies    Gabapentin    Review of Systems   Review of Systems  Unable to perform ROS: Mental status change    Physical Exam Updated Vital Signs BP (!) 128/56 (BP Location: Left Arm)   Pulse 68   Temp 97.8 F (36.6 C) (Oral)   Resp 18   Ht 5\' 3"  (1.6 m)   Wt 60 kg   SpO2 92%   BMI 23.43 kg/m   Physical Exam Vitals and nursing note reviewed.  Constitutional:      General: She is not in acute distress.    Appearance: She is not ill-appearing, toxic-appearing or diaphoretic.  HENT:     Head: Normocephalic and atraumatic.     Mouth/Throat:     Mouth: Mucous membranes are dry.  Eyes:     General: No scleral icterus.    Extraocular Movements: Extraocular movements intact.     Conjunctiva/sclera: Conjunctivae normal.     Pupils: Pupils are equal, round, and reactive to light.  Cardiovascular:     Rate and Rhythm: Normal rate and regular rhythm.  Pulmonary:     Effort: Pulmonary effort is normal. No respiratory distress.     Breath sounds: Normal breath sounds. No stridor. No wheezing, rhonchi or rales.  Abdominal:     General: Abdomen is flat. There is no distension.     Palpations: Abdomen is soft.     Tenderness: There is no abdominal tenderness. There is no guarding or rebound.     Comments: Indwelling Foley catheter draining clear yellow urine  Genitourinary:    Comments: Nicholos Johns, primary RN, as chaperone, pt wearing diaper, minimal amounts of stool present, no significant erythema, no open wounds noted Musculoskeletal:        General: No deformity.     Cervical back: Normal range of motion and neck supple.     Comments: Generally weak, minimal spontaneous movement of extremities x 4, pressure ulcer to right heel with areas of overlying eschar, no purulent or foul smelling drainage, no surrounding erythema or increased warmth, 2+ DP and PT pulses, normal sensation  Neurological:     Mental Status: She is oriented to person, place, and time.     Cranial Nerves: Cranial nerves 2-12 are intact. No cranial nerve deficit, dysarthria or facial asymmetry.     Sensory: Sensation is intact.     Motor: Atrophy (generalized to extremities x 4) present.     Comments: Somnolent but is arousable to voice, speaks in short phrases but opens eyes and answers questions appropriately   Psychiatric:        Behavior: Behavior is cooperative.     ED Results / Procedures / Treatments   Labs (all labs ordered are listed, but only abnormal results are displayed) Labs Reviewed  URINALYSIS, ROUTINE W REFLEX MICROSCOPIC - Abnormal; Notable for the following components:      Result Value   APPearance CLOUDY (*)    Specific Gravity, Urine 1.002 (*)    Leukocytes,Ua LARGE (*)    Bacteria, UA MANY (*)    All other components within normal limits  SARS CORONAVIRUS 2 BY RT PCR  CULTURE, BLOOD (ROUTINE X 2)  CULTURE, BLOOD (ROUTINE X 2)  CBC  COMPREHENSIVE METABOLIC PANEL  MAGNESIUM  LACTIC ACID, PLASMA  LACTIC ACID, PLASMA  PROTIME-INR  BRAIN NATRIURETIC PEPTIDE  TROPONIN I (HIGH SENSITIVITY)  TROPONIN I (HIGH SENSITIVITY)    EKG EKG Interpretation Date/Time:  Friday June 21 2023 12:55:02 EDT Ventricular Rate:  66 PR Interval:  170 QRS Duration:  92 QT Interval:  447 QTC Calculation: 469 R Axis:   68  Text Interpretation: Sinus rhythm Atrial premature complex Low voltage with right axis deviation Nonspecific T abnormalities, lateral leads Confirmed by Virgina Norfolk [phone]) on 06/21/2023 3:07:38 PM  Radiology CT Head Wo Contrast  Result Date: 06/21/2023 CLINICAL DATA:  Neuro deficit, acute, stroke suspected. Weakness and lethargy. History of stroke and breast cancer. EXAM: CT HEAD WITHOUT CONTRAST TECHNIQUE: Contiguous axial images were obtained from the base of the skull through the vertex without intravenous contrast. RADIATION DOSE REDUCTION: This exam was performed according to the departmental dose-optimization program which includes automated exposure control, adjustment of the mA and/or kV according to patient size and/or use of iterative reconstruction technique. COMPARISON:  Head CT 03/30/2023 and MRI 02/14/2023 FINDINGS: Brain: There is mild residual left parieto-occipital vasogenic edema, decreased from the prior CT. There is a small amount of calcification  associated with the known underlying mass. No new brain edema, acute infarct, acute hemorrhage, midline shift, or extra-axial fluid collection is identified. Mild cerebral atrophy is within normal limits for age. Hypodensities elsewhere in the cerebral white matter bilaterally are unchanged and nonspecific but compatible with mild-to-moderate chronic small vessel ischemic disease. A 1 cm focus of subependymal gray matter heterotopia is again noted along the posterior body of the right lateral ventricle. Vascular: Calcified atherosclerosis at the skull base. No hyperdense vessel. Skull: No acute fracture or suspicious osseous lesion. Sinuses/Orbits: Paranasal sinuses and mastoid air cells are clear. Bilateral cataract extraction Other: None. IMPRESSION: 1. No evidence of acute intracranial abnormality. 2. Known left parieto-occipital mass with decreased, mild residual edema. 3. Mild-to-moderate chronic small vessel ischemic disease. Electronically Signed   By: Sebastian Ache M.D.   On: 06/21/2023 15:07   DG Foot 2 Views Right  Result Date: 06/21/2023 CLINICAL DATA:  Weakness. EXAM: RIGHT FOOT - 2 VIEW COMPARISON:  None Available. FINDINGS: No fracture. Significant narrowing with marginal osteophytes at the first metatarsophalangeal joint suspected to be osteoarthritis. Remaining joints normally spaced and aligned. Moderate-sized plantar calcaneal spur. There is soft tissue swelling most evident over the dorsal forefoot. No soft tissue air. IMPRESSION: 1. No fracture or acute skeletal finding. 2. Nonspecific soft tissue swelling. 3. 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 Plantar calcaneal spur. Electronically Signed   By: Amie Portland M.D.   On: 06/21/2023 13:26   DG Chest Portable 1 View  Result Date: 06/21/2023 CLINICAL DATA:  Weakness. EXAM: PORTABLE CHEST 1 VIEW COMPARISON:  04/01/2023 and older exams.  CT, 06/18/2023. FINDINGS: Left lung base opacity obscures the left  hemidiaphragm consistent with a small effusion and associated atelectasis or infection. Atelectasis favored and appearance is without significant change from the recent chest CT. Remainder of the lungs is clear. No right pleural effusion. No pneumothorax. Heart top-normal in size.  No mediastinal or hilar masses. Skeletal structures are grossly intact. Stable changes from right breast surgery. IMPRESSION: 1. Left lung base opacity consistent with a combination of pleural fluid and atelectasis. Pneumonia should be considered in the proper clinical setting. Overall, appearance is stable compared to the recent prior CT from 06/18/2023. Electronically Signed   By: Amie Portland M.D.   On: 06/21/2023 13:23  Procedures Procedures    Medications Ordered in ED Medications  lactated ringers bolus 500 mL (has no administration in time range)  piperacillin-tazobactam (ZOSYN) IVPB 3.375 g (has no administration in time range)    ED Course/ Medical Decision Making/ A&P Clinical Course as of 06/21/23 1513  Fri Jun 21, 2023  1350 Patient's indwelling Foley catheter has been placed in April of this year. Will have nursing replace.  [MG]  1450 Patient re-evaluation. Sleeping. Family member at bedside updated on findings thus far. Questionable pneumonia on XR. Discussed with family, will go a head and start on antibiotics for coverage. Question mild UTI as well, antibiotics should cover for underlying UTI if present. Mild hypokalemia, pt somnolent so will hold off on oral supplemental given mental status as well as IV supplementation given current limited IV access.  [MG]    Clinical Course User Index [MG] Tonette Lederer, PA-C                             Medical Decision Making Amount and/or Complexity of Data Reviewed Labs: ordered. Decision-making details documented in ED Course. Radiology: ordered. Decision-making details documented in ED Course. ECG/medicine tests: ordered. Decision-making details  documented in ED Course.  Risk Prescription drug management.   Medical Decision Making:   Ikia Gregus is a 88 y.o. female who presented to the ED today with weakness/AMS detailed above.    Additional history discussed with patient's family/caregivers.  Patient's presentation is complicated by their history of breast cancer metastatic to brain, CVA, multiple comorbidities.  Complete initial physical exam performed, notably the patient was generally weak. She had ulcer to right heel without purulent drainage or signs of surrounding cellulitis. Somnolent but oriented. LCTA. No respiratory distress.    Reviewed and confirmed nursing documentation for past medical history, family history, social history.    Initial Assessment:   With the patient's presentation, yhe differential diagnosis for AMS is extensive and includes, but is not limited to: drug overdose - opioids, alcohol, sedatives, antipsychotics, drug withdrawal, others; Metabolic: hypoxia, hypoglycemia, hyperglycemia, hypercalcemia, hypernatremia, hyponatremia, uremia, hepatic encephalopathy, hypothyroidism, hyperthyroidism, vitamin B12 or thiamine deficiency, carbon monoxide poisoning, Wilson's disease, Lactic acidosis, DKA/HHOS; Infectious: meningitis, encephalitis, bacteremia/sepsis, urinary tract infection, pneumonia, neurosyphilis; Structural: Space-occupying lesion, (brain tumor, subdural hematoma, hydrocephalus,); Vascular: stroke, subarachnoid hemorrhage, coronary ischemia, hypertensive encephalopathy, CNS vasculitis, thrombotic thrombocytopenic purpura, disseminated intravascular coagulation, hyperviscosity; Psychiatric: Schizophrenia, depression; Other: Seizure, hypothermia, heat stroke, dementia  This is most consistent with an acute complicated illness  Initial Plan:  Screening labs including CBC and Metabolic panel to evaluate for infectious or metabolic etiology of disease.  Urinalysis with reflex culture ordered to  evaluate for UTI or relevant urologic/nephrologic pathology.  CXR to evaluate for structural/infectious intrathoracic pathology.  EKG and troponin to evaluate for cardiac pathology Foot x-ray to assess for extensive infection CT brain to assess for etiology of weakness/malignancy COVID to assess for viral etiology Lactate, PT/INR to assess for infectious etiology BNP to assess for fluid overload Objective evaluation as below reviewed   Initial Study Results:   Laboratory  All laboratory results reviewed without evidence of clinically relevant pathology.   Exceptions include: UA with 6-10 WBCs   EKG EKG was reviewed independently. NSR, PACs, no STEMI.    Radiology:  All images reviewed independently. Agree with radiology report at this time.   CT Head Wo Contrast  Result Date: 06/21/2023 CLINICAL DATA:  Neuro deficit, acute, stroke suspected.  Weakness and lethargy. History of stroke and breast cancer. EXAM: CT HEAD WITHOUT CONTRAST TECHNIQUE: Contiguous axial images were obtained from the base of the skull through the vertex without intravenous contrast. RADIATION DOSE REDUCTION: This exam was performed according to the departmental dose-optimization program which includes automated exposure control, adjustment of the mA and/or kV according to patient size and/or use of iterative reconstruction technique. COMPARISON:  Head CT 03/30/2023 and MRI 02/14/2023 FINDINGS: Brain: There is mild residual left parieto-occipital vasogenic edema, decreased from the prior CT. There is a small amount of calcification associated with the known underlying mass. No new brain edema, acute infarct, acute hemorrhage, midline shift, or extra-axial fluid collection is identified. Mild cerebral atrophy is within normal limits for age. Hypodensities elsewhere in the cerebral white matter bilaterally are unchanged and nonspecific but compatible with mild-to-moderate chronic small vessel ischemic disease. A 1 cm focus of  subependymal gray matter heterotopia is again noted along the posterior body of the right lateral ventricle. Vascular: Calcified atherosclerosis at the skull base. No hyperdense vessel. Skull: No acute fracture or suspicious osseous lesion. Sinuses/Orbits: Paranasal sinuses and mastoid air cells are clear. Bilateral cataract extraction Other: None. IMPRESSION: 1. No evidence of acute intracranial abnormality. 2. Known left parieto-occipital mass with decreased, mild residual edema. 3. Mild-to-moderate chronic small vessel ischemic disease. Electronically Signed   By: Sebastian Ache M.D.   On: 06/21/2023 15:07   DG Foot 2 Views Right  Result Date: 06/21/2023 CLINICAL DATA:  Weakness. EXAM: RIGHT FOOT - 2 VIEW COMPARISON:  None Available. FINDINGS: No fracture. Significant narrowing with marginal osteophytes at the first metatarsophalangeal joint suspected to be osteoarthritis. Remaining joints normally spaced and aligned. Moderate-sized plantar calcaneal spur. There is soft tissue swelling most evident over the dorsal forefoot. No soft tissue air. IMPRESSION: 1. No fracture or acute skeletal finding. 2. Nonspecific soft tissue swelling. 3. 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 Plantar calcaneal spur. Electronically Signed   By: Amie Portland M.D.   On: 06/21/2023 13:26   DG Chest Portable 1 View  Result Date: 06/21/2023 CLINICAL DATA:  Weakness. EXAM: PORTABLE CHEST 1 VIEW COMPARISON:  04/01/2023 and older exams.  CT, 06/18/2023. FINDINGS: Left lung base opacity obscures the left hemidiaphragm consistent with a small effusion and associated atelectasis or infection. Atelectasis favored and appearance is without significant change from the recent chest CT. Remainder of the lungs is clear. No right pleural effusion. No pneumothorax. Heart top-normal in size.  No mediastinal or hilar masses. Skeletal structures are grossly intact. Stable changes from right breast surgery. IMPRESSION:  1. Left lung base opacity consistent with a combination of pleural fluid and atelectasis. Pneumonia should be considered in the proper clinical setting. Overall, appearance is stable compared to the recent prior CT from 06/18/2023. Electronically Signed   By: Amie Portland M.D.   On: 06/21/2023 13:23   CT CHEST ABDOMEN PELVIS W CONTRAST  Result Date: 06/19/2023 CLINICAL DATA:  Metastatic disease evaluation. Breast carcinoma with brain metastasis. * Tracking Code: BO * EXAM: CT CHEST, ABDOMEN, AND PELVIS WITH CONTRAST TECHNIQUE: Multidetector CT imaging of the chest, abdomen and pelvis was performed following the standard protocol during bolus administration of intravenous contrast. RADIATION DOSE REDUCTION: This exam was performed according to the departmental dose-optimization program which includes automated exposure control, adjustment of the mA and/or kV according to patient size and/or use of iterative reconstruction technique. CONTRAST:  OMNIPAQUE IOHEXOL 300 MG/ML  SOLN COMPARISON:  CT chest abdomen pelvis 02/14/2023, MRI  03/11/2006 FINDINGS: CT CHEST FINDINGS Cardiovascular: No significant vascular findings. Normal heart size. No pericardial effusion. Mediastinum/Nodes: Nodular mass in the high LEFT axilla measures 2 cm decreased from 3.6 cm on CT chest 03/09/2023. No mediastinal or hilar adenopathy. No pericardial fluid. Esophagus normal. Lungs/Pleura: Bilateral pleural effusions with small effusion on the RIGHT and small to moderate effusion LEFT. There is passive atelectasis lower lobes. No suspicious pulmonary nodularity. Musculoskeletal: No skeletal metastasis. CT ABDOMEN AND PELVIS FINDINGS Hepatobiliary: New multifocal hypoenhancing round lesions of LEFT and RIGHT hepatic lobe. Approximately 40 lesions in liver. Example lesion the caudate lobe measures 2.1 cm (image 47). Example lesion inferior RIGHT hepatic lobe measures 2.4 cm (image 61). High-density within the gallbladder sludge sludge.  No biliary duct dilatation. Pancreas: Cystic lesion along the genu of the pancreas measures 13 mm compared to 13 mm on comparison CT. No pancreatic duct dilatation. Smaller cystic lesion in the head of the pancreas measuring 10 mm. Spleen: Low-density lesion in the spleen is favored a benign cyst or hemangioma. Adrenals/urinary tract: Enlargement of the LEFT adrenal gland to 15 mm not changed from comparison exam. Lesion identified as benign adenoma on MRI 2007. The kidneys, ureters and bladder normal. Foley catheter within the bladder Stomach/Bowel: The stomach, duodenum, and small bowel normal. Partial resection of the ascending colon. Multiple diverticula of the descending colon and sigmoid colon without acute inflammation. Vascular/Lymphatic: Abdominal aorta is normal caliber with atherosclerotic calcification. There is no retroperitoneal or periportal lymphadenopathy. No pelvic lymphadenopathy. Reproductive: Uterus and adnexa unremarkable. Other: No free fluid. Musculoskeletal: No aggressive osseous lesion. IMPRESSION: CHEST: 1. Decreased size of LEFT axillary mass. 2. No evidence of pulmonary metastatic disease. 3. Bilateral pleural effusions with passive atelectasis. PELVIS: 1. New multifocal bilobar HEPATIC METASTASIS. 2. Stable LEFT adrenal gland adenoma characterized on MRI 03/11/2026 3. Stable cystic lesions in the pancreas. Recommend attention on routine follow-up. 4. Colonic diverticulosis without evidence diverticulitis. 5.  Aortic Atherosclerosis (ICD10-I70.0). Electronically Signed   By: Genevive Bi M.D.   On: 06/19/2023 13:07    Final Assessment and Plan:   88 year old female brought to the ED by family for evaluation of generalized weakness.  She is somnolent on exam but will arouse to verbal stimuli and is oriented x 4.  Minimal spontaneous movements.  No respiratory distress. Family report at baseline until yesterday.  Recently diagnosed with recurrent breast cancer known to be metastatic  to the brain and liver.  Had a previous double mastectomy, has completed radiation treatment, and is scheduled to start chemotherapy infusions next week.  Workup initiated as above for further assessment.  She has an indwelling Foley catheter that has been in place since April.  Likely mild UTI.  On chest x-ray, she also has an area in the left lower lobe that is suspicious for pneumonia.  Antibiotics initiated for treatment.  She has a chronic pressure ulcer to the right heel.  X-ray without extensive infection. Overall well appearing wound on exam. No streaking erythema. No acute findings on CT brain. Nursing staff with difficulty obtaining IV access. Care transitioned to oncoming team, Dr. Lockie Mola, pending labs and anticipated admission.    Clinical Impression:  1. Generalized weakness   2. Pneumonia of left lower lobe due to infectious organism   3. Pressure injury of right heel, stage 2 (HCC)   4. Acute cystitis without hematuria   5. Hypokalemia   6. Metastatic cancer to brain (HCC)   7. Metastatic cancer to liver (HCC)  Data Unavailable           Final Clinical Impression(s) / ED Diagnoses Final diagnoses:  Pneumonia of left lower lobe due to infectious organism  Generalized weakness  Pressure injury of right heel, stage 2 (HCC)  Acute cystitis without hematuria  Hypokalemia  Metastatic cancer to brain [facility])  Metastatic cancer to liver [facility])    Rx / DC Orders ED Discharge Orders     None         Tonette Lederer, PA-C 06/21/23 1516    Alvira Monday, MD 06/21/23 2329

## 2023-06-21 NOTE — Progress Notes (Signed)
BG of 63 Patient asymptomatic 8oz OJ given   Rechecked BG at 2205: BG of 65 Patient asymptomatic  8oz OJ given  Rechecked BG at 2230: BG of 71 Patient asymptomatic 4oz OJ given Ice cream given  On IM call made aware

## 2023-06-21 NOTE — H&P (Cosign Needed Addendum)
Date: 06/21/2023               Patient Name:  Amanda Green MRN: [id number]  DOB: 1935-01-11 Age / Sex: 88 y.o., female   PCP: [name], DO         Medical Service: Internal Medicine Teaching Service         Attending Physician: Dr. Oswaldo Done, Marquita Palms, *      First Contact: Dr. Morrie Sheldon, MD Pager [phone]    Second Contact: Dr. Elza Rafter, DO Pager [phone]         After Hours (After 5p/  First Contact Pager: [phone]  weekends / holidays): Second Contact Pager: [phone]   SUBJECTIVE   Chief Complaint: Acute encephalopathy  History of Present Illness: Amanda Green is an [name] with breast cancer s/p radiation in March 2024 with a history of asymptomatic atrial fibrillation, TTR amyloidosis, hypertension, hyperlipidemia, type 2 diabetes, CVA, breast cancer, and cognitive impairment.   Patient was on home hospice starting April 2024 after her function started to decline after radiation.  However, she has been off of hospice for 1 week because the patient was improving.  Afterwards, she met with her oncologist and cardiologist.  On 06/19/2023, cardiology recommended that she restart tafamidis and increase spironolactone to 25 mg once daily due to lower extremity edema and hypokalemia present on last blood work.  Eliquis was also discontinued due to risk of bleeding with metastases to the brain.  She was scheduled to start chemotherapy this Tuesday with Dr.Gudena.  She presented to the ED with concerns of declining mental status and generalized weakness. Family noted that yesterday, the patient's blood pressure dropped and the patient was asleep for most of the day which was unusual compared to her baseline. At this time, family called EMS but the patient did not want to come to the ED.  Last night, family noted patient was feeling more tired but could still communicate.  She denied any fever, chills, chest pain, abdominal pain, nausea, vomiting, and  diarrhea.  Family also denies any recent sick contacts. This morning while the patient was receiving her coffee from her daughter, the patient cannot hold her cup which was not normal compared to her baseline and led to ED visit.  MIST categories  Meds:  Amiodarone 100 mg daily  Vitamin C Brimonidine eye drops daily Vitamin B12 Dorzolamide 1 drop BID Lasix 20 mg daily Hydrochlorothiazide 12.5 mg daily Losartan 25 mg daily Magnesium Memantine 10 mg bid Metoprolol 50 mg BID  Lyrica 75 mg TID Spironolactone 25 mg daily Tafamidis 61 mg daily Turmeric  Current Meds  Medication Sig   acetaminophen (TYLENOL) 650 MG CR tablet Take 650-1,300 mg by mouth 2 (two) times daily as needed for pain.   albuterol (PROVENTIL) (2.5 MG/3ML) 0.083% nebulizer solution Take 2.5 mg by nebulization 2 (two) times daily as needed for wheezing or shortness of breath.   amiodarone (PACERONE) 100 MG tablet Take 1 tablet (100 mg total) by mouth daily.   ASCORBIC ACID PO Take 1 tablet by mouth daily. Vitamin C, unknown strength.   brimonidine (ALPHAGAN) 0.2 % ophthalmic solution Place 1 drop into both eyes daily.   Cyanocobalamin (VITAMIN B-12 PO) Take 1 tablet by mouth daily.   diclofenac Sodium (VOLTAREN) 1 % GEL Apply 1 Application topically 4 (four) times daily as needed (joint pain).   dorzolamide (TRUSOPT) 2 % ophthalmic solution Place 1 drop into both eyes 2 (two) times daily.  furosemide (LASIX) 20 MG tablet Take 1 tablet (20 mg total) by mouth daily.   hydrochlorothiazide (HYDRODIURIL) 12.5 MG tablet Take 12.5 mg by mouth daily.   losartan (COZAAR) 25 MG tablet Take 1 tablet (25 mg total) by mouth daily.   MAGNESIUM OXIDE PO Take 1 tablet by mouth daily.   memantine (NAMENDA) 10 MG tablet Take 1 tablet (10 mg total) by mouth 2 (two) times daily.   metoprolol tartrate (LOPRESSOR) 50 MG tablet Take 1 tablet (50 mg total) by mouth 2 (two) times daily.   Multiple Vitamins-Minerals (MULTIVITAMIN WOMEN 50+)  TABS Take 1 tablet by mouth daily.   pregabalin (LYRICA) 75 MG capsule Take 75 mg by mouth 3 (three) times daily.   spironolactone (ALDACTONE) 25 MG tablet Take 1 tablet (25 mg total) by mouth daily.   Tafamidis (VYNDAMAX) 61 MG CAPS Take 61 mg by mouth daily.   TURMERIC CURCUMIN PO Take 1 tablet by mouth daily.    Past Medical History  Past Surgical History:  Procedure Laterality Date   ABDOMINAL HYSTERECTOMY     APPENDECTOMY     BREAST BIOPSY Left 01/19/2010   BREAST LUMPECTOMY WITH RADIOACTIVE SEED LOCALIZATION Left 07/17/2019   Procedure: LEFT BREAST LUMPECTOMY WITH RADIOACTIVE SEED LOCALIZATION;  Surgeon: Luretha Murphy, MD;  Location: [facility];  Service: General;  Laterality: Left;   BREAST SURGERY     Right mastectomy 1999   CARPAL TUNNEL RELEASE Right 09/30/2014   Procedure: RIGHT CARPAL TUNNEL RELEASE;  Surgeon: Cindee Salt, MD;  Location: [facility];  Service: Orthopedics;  Laterality: Right;   COLONOSCOPY     EYE SURGERY     cataracts   JOINT REPLACEMENT     lt total knee 2011   LIPOMA EXCISION  11/28/2011   Procedure: EXCISION LIPOMA;  Surgeon: Valarie Merino, MD;  Location: [facility];  Service: General;  Laterality: N/A;  excisino lipoma 4 cm back of neck   MASTECTOMY Right 1999   TOTAL MASTECTOMY Left 05/10/2021   Procedure: LEFT TOTAL MASTECTOMY;  Surgeon: Luretha Murphy, MD;  Location: [facility];  Service: General;  Laterality: Left;    Social:  Lives With: Daughters in [place] Support: Family Level of Function: Bedbound since March, daughters help move to chair PCP: Cardiology Dr.Croitoru; PCP is Dr. Sharyn Lull Substances: No tobacco, no alcohol, no other substance use  Family History:  Mother: Throat cancer Brother: Colon cancer, HTN, DM  Allergies: Allergies as of 06/21/2023 - Review Complete 06/21/2023  Allergen Reaction Noted   Neurontin [gabapentin] Other (See Comments) 08/26/2020    Review of  Systems: A complete ROS was negative except as per HPI.   OBJECTIVE:   Physical Exam: Blood pressure (!) 149/68, pulse 70, temperature (!) 97.3 F (36.3 C), temperature source Oral, resp. rate 18, height 5\' 3"  (1.6 m), weight 60 kg, SpO2 98 %.   Physical Exam Constitutional:      General: She is sleeping.  Cardiovascular:     Rate and Rhythm: Normal rate and regular rhythm.  Pulmonary:     Effort: Pulmonary effort is normal.     Breath sounds: Normal breath sounds.  Abdominal:     General: Abdomen is flat. Bowel sounds are normal.  Neurological:     Mental Status: She is lethargic.     Motor: Weakness present.     Comments: Arousable to voice and able to follow commands.  She could open her eyes spontaneously.  She had normal strength and  could follow commands in the upper extremity and could elevate with gravity.  She cannot move her lower extremity and could not elevate against gravity.  She was not oriented to location or situation.  Psychiatric:        Behavior: Behavior is cooperative.     Labs: CBC    Component Value Date/Time   WBC 8.0 06/21/2023 1513   RBC 3.23 (L) 06/21/2023 1513   HGB 9.5 (L) 06/21/2023 1513   HGB 10.4 (L) 06/18/2023 0940   HGB 12.3 02/07/2021 1050   HCT 29.4 (L) 06/21/2023 1513   HCT 38.0 02/07/2021 1050   PLT 378 06/21/2023 1513   PLT 329 06/18/2023 0940   MCV 91.0 06/21/2023 1513   MCV 88 02/07/2021 1050   MCH 29.4 06/21/2023 1513   MCHC 32.3 06/21/2023 1513   RDW 19.3 (H) 06/21/2023 1513   RDW 13.0 02/07/2021 1050   LYMPHSABS 1.2 06/18/2023 0940   LYMPHSABS 1.3 02/07/2021 1050   MONOABS 0.5 06/18/2023 0940   EOSABS 0.2 06/18/2023 0940   EOSABS 0.3 02/07/2021 1050   BASOSABS 0.1 06/18/2023 0940   BASOSABS 0.1 02/07/2021 1050     CMP     Component Value Date/Time   NA 141 06/21/2023 1513   NA 139 02/07/2021 1050   K 3.9 06/21/2023 1513   CL 98 06/21/2023 1513   CO2 32 06/21/2023 1513   GLUCOSE 91 06/21/2023 1513   BUN 10  06/21/2023 1513   BUN 17 02/07/2021 1050   CREATININE 0.55 06/21/2023 1513   CREATININE 0.36 (L) 06/18/2023 0940   CALCIUM 10.1 06/21/2023 1513   PROT 5.7 (L) 06/21/2023 1513   PROT 6.7 01/03/2022 1527   ALBUMIN 2.2 (L) 06/21/2023 1513   ALBUMIN 3.9 02/07/2021 1050   AST 107 (H) 06/21/2023 1513   AST 99 (H) 06/18/2023 0940   ALT 36 06/21/2023 1513   ALT 36 06/18/2023 0940   ALKPHOS 384 (H) 06/21/2023 1513   BILITOT 0.7 06/21/2023 1513   BILITOT 0.4 06/18/2023 0940   GFRNONAA >60 06/21/2023 1513   GFRNONAA >60 06/18/2023 0940   GFRAA 72 02/07/2021 1050    Imaging:  Amie Portland M.D. 06/21/2023 15:07  EXAM: CT HEAD WITHOUT CONTRAST IMPRESSION: 1. No evidence of acute intracranial abnormality. 2. Known left parieto-occipital mass with decreased, mild residual edema. 3. Mild-to-moderate chronic small vessel ischemic disease.  Amie Portland M.D. 06/21/2023 13:23 EXAM: PORTABLE CHEST 1 VIEW IMPRESSION: 1. Left lung base opacity consistent with a combination of pleural fluid and atelectasis. Pneumonia should be considered in the proper clinical setting. Overall, appearance is stable compared to the recent prior CT from 06/18/2023.  ASSESSMENT & PLAN:   Assessment & Plan by Problem: Principal Problem:   Acute metabolic encephalopathy  Amanda Green is a 88 y.o. person living with a history of asymptomatic atrial fibrillation, TTR amyloidosis, hypertension, hyperlipidemia, type 2 diabetes, CVA, breast cancer, and cognitive impairment who presented with declining mental status and weakness and admitted for acute encephalopathy on hospital day 0  #Acute encephalopathy #Generalized weakness The patient has had a decline in her functional and mental status starting yesterday evening. Per family, patient is bedbound at baseline, but able to feed herself and converse normally.  Starting last night, the patient was more fatigued than normal and also was not able to feed herself.  On exam, she is asleep but can follow instructions slowly and open her eyes when prompted.  She cannot move her lower extremities but this is normal  per family. Of note, she has had a Foley catheter since April and it has not been changed since then. Patient denied dysuria or other urinary symptoms.  Lactic acid 1.2. Urinalysis was positive for large leukocytes and many bacteria. Chest x-ray noted left lung base opacity raising suspicion for pneumonia, however, clinically she has no symptoms to suggest this. CT head notable for left parieto-occipital mass consistent with prior scans- unlikely the etiology of her sxs as this is not new.  The etiology of the declining mental status remains unclear - the positive urinalysis and declining mental status is concerning for UTI but unclear in the context of a chronic Foley catheter.  Pneumonia is possible but less likely as the patient is afebrile and has normal white blood cell count and no respiratory symptoms.  New changes to medication also possibility as adjustments were made 2 days ago (her amiodarone was adjusted, spiro was increased, and she was restarted on tafamidis).  Unclear if causative but timing correlates with symptom onset.  No indication for MRSA coverage currently as patient had a negative MRSA swab in April. -Blood cultures pending -Empiric Zosyn received in ED; ceftriaxone scheduled for 7/6 -Consider LP if fevers -Trend CBC and BMP -Hold Lyrica/centrally acting medications -N.p.o. until bedside swallow passed - PT/OT eval -Would benefit from reengaging hospice as patient has poor functional status at baseline  #Metastatic breast Cancer Patient was diagnosed with breast cancer and had a right mastectomy in 1999.  In 2020, was diagnosed with left breast DCIS grade 3 and underwent a left lumpectomy.  In 2022 underwent a left mastectomy.  In 2024, the patient was noted to have left occipital lobe brain mets and she underwent radiation to the  brain in March and per family, has declined in functional status since then.  After a brief improvement was noted in her functional status last week, the patient was taken off of hospice and scheduled to receive chemotherapy on Tuesday.  They will be starting Herceptin injections.  #Atrial Fibrillation #Hypertension Diagnosed with atrial fibrillation in April 2022.  Admitted on 03/09/2023 for A-fib with RVR after presenting with shortness of breath and swelling.  Converted to sinus with amiodarone.  Currently in sinus rhythm. - Currently on Amio 100 mg daily, will hold currently - Metoprolol 50 mg twice daily, will resume if patient able to take po meds - Eliquis was discontinued due to risk of bleeding with metastasis to brain  #Elevated Alk Phos Noted on cardiology visit 7/3 and ED admission.  Alk phos was 384.  AST was 107.  Etiology could be 2/2 to metastatic cancer versus amiodarone dose adjustments. -Continue to monitor LFTs  #TTR amyloidosis Diagnosis of wild-type transthyretin amyloidosis that came following A-fib diagnosis in 2022.  PYP scan was highly suggestive of cardiac amyloidosis and patient was on tafamidis and was restarted on 7/3. Unclear if restarting of tafamidis is contributing to her symptoms.   #Diabetes Not currently on medications.  In February her last A1c was 6.5% not on medications  Diet: NPO, will resume diet if passes bedside swallow  VTE:  Lovenox IVF: LR,100cc/hr Code: DNR  Prior to Admission Living Arrangement:  Home Anticipated Discharge Location: Home Barriers to Discharge: Medical management  Dispo: Admit patient to Inpatient with expected length of stay greater than 2 midnights.  Signed: Chauncey Mann DO Internal Medicine Resident PGY-3  06/21/2023, 7:47 PM

## 2023-06-21 NOTE — ED Notes (Signed)
ED TO INPATIENT HANDOFF REPORT  ED Nurse Name and Phone #: Nicholos Johns [phone]  S Name/Age/Gender Amanda Green 88 y.o. female Room/Bed: 019C/019C  Code Status   Code Status: DNR  Home/SNF/Other Home Patient oriented to: self Is this baseline? No   Triage Complete: Triage complete  Chief Complaint Acute metabolic encephalopathy [G93.41]  Triage Note Weakness for several days, somnolent and lethargic per family. Large CVA last year. A&ox3 for EMS. Indwelling foley   Allergies Allergies  Allergen Reactions   Neurontin [Gabapentin] Other (See Comments)    Drowsiness    Level of Care/Admitting Diagnosis ED Disposition     ED Disposition  Admit   Condition  --   Comment  Hospital Area: [facility] [[id number]]  Level of Care: Med-Surg [16]  May place patient in observation at [facility] or [facility] if equivalent level of care is available:: No  Covid Evaluation: Asymptomatic - no recent exposure (last 10 days) testing not required  Diagnosis: Acute metabolic encephalopathy [1478295]  Admitting Physician: Tyson Alias [[id number]]  Attending Physician: Tyson Alias [[id number]]          B Medical/Surgery History Past Medical History:  Diagnosis Date   Arthritis    knees,   Breast cancer (HCC) 1999   right mastectomy   Breast cancer, left (HCC) 05/2019   left lumpectomy   Cerebrovascular accident (CVA) (HCC) 02/07/2021   Diabetes mellitus    Dislocation of left shoulder joint    Family history of prostate cancer    Hyperlipidemia    Hypertension    Memory loss    Numbness and tingling of right lower extremity    Paresthesia    Stroke (HCC)    light stroke - 2012 ,right leg nerve damage    Past Surgical History:  Procedure Laterality Date   ABDOMINAL HYSTERECTOMY     APPENDECTOMY     BREAST BIOPSY Left 01/19/2010   BREAST LUMPECTOMY WITH RADIOACTIVE SEED LOCALIZATION Left 07/17/2019   Procedure: LEFT BREAST  LUMPECTOMY WITH RADIOACTIVE SEED LOCALIZATION;  Surgeon: Luretha Murphy, MD;  Location: [facility];  Service: General;  Laterality: Left;   BREAST SURGERY     Right mastectomy 1999   CARPAL TUNNEL RELEASE Right 09/30/2014   Procedure: RIGHT CARPAL TUNNEL RELEASE;  Surgeon: Cindee Salt, MD;  Location: [facility];  Service: Orthopedics;  Laterality: Right;   COLONOSCOPY     EYE SURGERY     cataracts   JOINT REPLACEMENT     lt total knee 2011   LIPOMA EXCISION  11/28/2011   Procedure: EXCISION LIPOMA;  Surgeon: Valarie Merino, MD;  Location: [facility];  Service: General;  Laterality: N/A;  excisino lipoma 4 cm back of neck   MASTECTOMY Right 1999   TOTAL MASTECTOMY Left 05/10/2021   Procedure: LEFT TOTAL MASTECTOMY;  Surgeon: Luretha Murphy, MD;  Location: [facility];  Service: General;  Laterality: Left;     A IV Location/Drains/Wounds Patient Lines/Drains/Airways Status     Active Line/Drains/Airways     Name Placement date Placement time Site Days   Peripheral IV 06/21/23 22 G 1.75" Anterior;Right;Lateral Forearm 06/21/23  1500  Forearm  less than 1   Peripheral IV 06/21/23 20 G Left Antecubital 06/21/23  1558  Antecubital  less than 1   Urethral Catheter Sherol Dade, RN Straight-tip 16 Fr. 06/21/23  1516  Straight-tip  less than 1   Pressure Injury 03/09/23 Buttocks Right Stage 2 -  Partial thickness loss of dermis presenting as a shallow open injury with a red, pink wound bed without slough. less than 1cm size. pink/red 03/09/23  1854  -- 104            Intake/Output Last 24 hours No intake or output data in the 24 hours ending 06/21/23 1810  Labs/Imaging Results for orders placed or performed during the hospital encounter of 06/21/23 (from the past 48 hour(s))  SARS Coronavirus 2 by RT PCR (hospital order, performed in [facility] hospital lab) *cepheid single result test* Anterior Nasal Swab     Status: None   Collection Time:  06/21/23 12:40 PM   Specimen: Anterior Nasal Swab  Result Value Ref Range   SARS Coronavirus 2 by RT PCR NEGATIVE NEGATIVE    Comment: Performed at [facility], [address], [place], [place] [address]  Lactic acid, plasma     Status: None   Collection Time: 06/21/23 12:40 PM  Result Value Ref Range   Lactic Acid, Venous 1.2 0.5 - 1.9 mmol/L    Comment: Performed at [facility], [address] [place], [place] [address]  Urinalysis, Routine w reflex microscopic -Urine, Catheterized; Indwelling urinary catheter     Status: Abnormal   Collection Time: 06/21/23  1:15 PM  Result Value Ref Range   Color, Urine YELLOW YELLOW   APPearance CLOUDY (A) CLEAR   Specific Gravity, Urine 1.002 (L) 1.005 - 1.030   pH 8.0 5.0 - 8.0   Glucose, UA NEGATIVE NEGATIVE mg/dL   Hgb urine dipstick NEGATIVE NEGATIVE   Bilirubin Urine NEGATIVE NEGATIVE   Ketones, ur NEGATIVE NEGATIVE mg/dL   Protein, ur NEGATIVE NEGATIVE mg/dL   Nitrite NEGATIVE NEGATIVE   Leukocytes,Ua LARGE (A) NEGATIVE   RBC / HPF 0-5 0 - 5 RBC/hpf   WBC, UA 6-10 0 - 5 WBC/hpf   Bacteria, UA MANY (A) NONE SEEN   Squamous Epithelial / HPF 0-5 0 - 5 /HPF    Comment: Performed at [facility], [address] [place], [place] [address]  CBC     Status: Abnormal   Collection Time: 06/21/23  3:13 PM  Result Value Ref Range   WBC 8.0 4.0 - 10.5 K/uL   RBC 3.23 (L) 3.87 - 5.11 MIL/uL   Hemoglobin 9.5 (L) 12.0 - 15.0 g/dL   HCT 13.0 (L) 86.5 - 78.4 %   MCV 91.0 80.0 - 100.0 fL   MCH 29.4 26.0 - 34.0 pg   MCHC 32.3 30.0 - 36.0 g/dL   RDW 69.6 (H) 29.5 - 28.4 %   Platelets 378 150 - 400 K/uL   nRBC 0.0 0.0 - 0.2 %    Comment: Performed at [facility], [address], [place], [place] [address]  Troponin I (High Sensitivity)     Status: None   Collection Time: 06/21/23  3:13 PM  Result Value Ref Range   Troponin I (High Sensitivity) 17 <18 ng/L    Comment: (NOTE) Elevated high sensitivity troponin I  (hsTnI) values and significant  changes across serial measurements may suggest ACS but many other  chronic and acute conditions are known to elevate hsTnI results.  Refer to the "Links" section for chest pain algorithms and additional  guidance. Performed at [facility], [address], [place], [place] [address]   Comprehensive metabolic panel     Status: Abnormal   Collection Time: 06/21/23  3:13 PM  Result Value Ref  Range   Sodium 141 135 - 145 mmol/L   Potassium 3.9 3.5 - 5.1 mmol/L   Chloride 98 98 - 111 mmol/L   CO2 32 22 - 32 mmol/L   Glucose, Bld 91 70 - 99 mg/dL    Comment: Glucose reference range applies only to samples taken after fasting for at least 8 hours.   BUN 10 8 - 23 mg/dL   Creatinine, Ser 1.91 0.44 - 1.00 mg/dL   Calcium 47.8 8.9 - 29.5 mg/dL   Total Protein 5.7 (L) 6.5 - 8.1 g/dL   Albumin 2.2 (L) 3.5 - 5.0 g/dL   AST 621 (H) 15 - 41 U/L   ALT 36 0 - 44 U/L   Alkaline Phosphatase 384 (H) 38 - 126 U/L   Total Bilirubin 0.7 0.3 - 1.2 mg/dL   GFR, Estimated >30 >86 mL/min    Comment: (NOTE) Calculated using the CKD-EPI Creatinine Equation (2021)    Anion gap 11 5 - 15    Comment: Performed at [facility], [address], [place], [place] [address]  Magnesium     Status: None   Collection Time: 06/21/23  3:13 PM  Result Value Ref Range   Magnesium 1.9 1.7 - 2.4 mg/dL    Comment: Performed at [facility], [address] [place], [place] [address]  Protime-INR     Status: None   Collection Time: 06/21/23  3:13 PM  Result Value Ref Range   Prothrombin Time 14.8 11.4 - 15.2 seconds   INR 1.1 0.8 - 1.2    Comment: (NOTE) INR goal varies based on device and disease states. Performed at [facility], [address] [place], [place] [address]    CT Head Wo Contrast  Result Date: 06/21/2023 CLINICAL DATA:  Neuro deficit, acute, stroke suspected. Weakness and lethargy. History of stroke and breast cancer. EXAM: CT HEAD WITHOUT  CONTRAST TECHNIQUE: Contiguous axial images were obtained from the base of the skull through the vertex without intravenous contrast. RADIATION DOSE REDUCTION: This exam was performed according to the departmental dose-optimization program which includes automated exposure control, adjustment of the mA and/or kV according to patient size and/or use of iterative reconstruction technique. COMPARISON:  Head CT 03/30/2023 and MRI 02/14/2023 FINDINGS: Brain: There is mild residual left parieto-occipital vasogenic edema, decreased from the prior CT. There is a small amount of calcification associated with the known underlying mass. No new brain edema, acute infarct, acute hemorrhage, midline shift, or extra-axial fluid collection is identified. Mild cerebral atrophy is within normal limits for age. Hypodensities elsewhere in the cerebral white matter bilaterally are unchanged and nonspecific but compatible with mild-to-moderate chronic small vessel ischemic disease. A 1 cm focus of subependymal gray matter heterotopia is again noted along the posterior body of the right lateral ventricle. Vascular: Calcified atherosclerosis at the skull base. No hyperdense vessel. Skull: No acute fracture or suspicious osseous lesion. Sinuses/Orbits: Paranasal sinuses and mastoid air cells are clear. Bilateral cataract extraction Other: None. IMPRESSION: 1. No evidence of acute intracranial abnormality. 2. Known left parieto-occipital mass with decreased, mild residual edema. 3. Mild-to-moderate chronic small vessel ischemic disease. Electronically Signed   By: Sebastian Ache M.D.   On: 06/21/2023 15:07   DG Foot 2 Views Right  Result Date: 06/21/2023 CLINICAL DATA:  Weakness. EXAM: RIGHT FOOT - 2 VIEW COMPARISON:  None Available. FINDINGS: No fracture. Significant narrowing with marginal osteophytes at the first metatarsophalangeal joint suspected to be osteoarthritis. Remaining joints  normally spaced and aligned. Moderate-sized plantar  calcaneal spur. There is soft tissue swelling most evident over the dorsal forefoot. No soft tissue air. IMPRESSION: 1. No fracture or acute skeletal finding. 2. Nonspecific soft tissue swelling. 3. 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 Plantar calcaneal spur. Electronically Signed   By: Amie Portland M.D.   On: 06/21/2023 13:26   DG Chest Portable 1 View  Result Date: 06/21/2023 CLINICAL DATA:  Weakness. EXAM: PORTABLE CHEST 1 VIEW COMPARISON:  04/01/2023 and older exams.  CT, 06/18/2023. FINDINGS: Left lung base opacity obscures the left hemidiaphragm consistent with a small effusion and associated atelectasis or infection. Atelectasis favored and appearance is without significant change from the recent chest CT. Remainder of the lungs is clear. No right pleural effusion. No pneumothorax. Heart top-normal in size.  No mediastinal or hilar masses. Skeletal structures are grossly intact. Stable changes from right breast surgery. IMPRESSION: 1. Left lung base opacity consistent with a combination of pleural fluid and atelectasis. Pneumonia should be considered in the proper clinical setting. Overall, appearance is stable compared to the recent prior CT from 06/18/2023. Electronically Signed   By: Amie Portland M.D.   On: 06/21/2023 13:23    Pending Labs Unresulted Labs (From admission, onward)     Start     Ordered   06/22/23 0500  Basic metabolic panel  Tomorrow morning,   R        06/21/23 1806   06/22/23 0500  CBC  Tomorrow morning,   R        06/21/23 1806   06/21/23 1240  Lactic acid, plasma  Now then every 2 hours,   R (with STAT occurrences)      06/21/23 1239   06/21/23 1240  Blood culture (routine x 2)  BLOOD CULTURE X 2,   R (with STAT occurrences)      06/21/23 1239   06/21/23 1240  Brain natriuretic peptide  Once,   URGENT        06/21/23 1239            Vitals/Pain Today's Vitals   06/21/23 1715 06/21/23 1730 06/21/23 1745 06/21/23 1759  BP:  118/63 (!) 145/69 (!) 158/67   Pulse: 64 67 67   Resp: 12 15 13    Temp:      TempSrc:      SpO2: 98% 99% 100%   Weight:      Height:      PainSc:    0-No pain    Isolation Precautions No active isolations  Medications Medications  enoxaparin (LOVENOX) injection 40 mg (has no administration in time range)  acetaminophen (TYLENOL) tablet 650 mg (has no administration in time range)    Or  acetaminophen (TYLENOL) suppository 650 mg (has no administration in time range)  lactated ringers bolus 500 mL (0 mLs Intravenous Stopped 06/21/23 1609)  piperacillin-tazobactam (ZOSYN) IVPB 3.375 g (0 g Intravenous Stopped 06/21/23 1752)  sodium chloride 0.9 % bolus 1,000 mL (0 mLs Intravenous Stopped 06/21/23 1645)    Mobility non-ambulatory     Focused Assessments Neuro Assessment Handoff:  Swallow screen pass?  Pt not very alert, have not attempted. MD wanted to wait until pt more awake         Neuro Assessment:   Neuro Checks:      Has TPA been given? No If patient is a Neuro Trauma and patient is going to OR before floor call report to 4N Charge nurse: [phone] or [phone]  R Recommendations: See Admitting Provider Note  Report given to:   Additional Notes: .

## 2023-06-21 NOTE — ED Provider Notes (Signed)
Shared PA visit.  Patient here with generalized weakness.  History of CVA diabetes chronic Foley catheter.  Breast cancer status postmastectomy and radiation.  Starting chemotherapy next week.  Was previously on hospice but no longer.  Lives at home with family.  Generalized weakness last 2 days.  Infectious workup was initiated.  Appears that she has a urinary tract infection.  I have a lower concern for pneumonia.  Chest x-ray somewhat equivocal.  She does not have a cough.  Vital signs and lab work otherwise unremarkable per my review interpretation.  Have no concern for sepsis.  However we will admit her for IV antibiotics and hydration.  Admitted to internal medicine for further care.  This chart was dictated using voice recognition software.  Despite best efforts to proofread,  errors can occur which can change the documentation meaning.    Virgina Norfolk, DO 06/21/23 1728

## 2023-06-22 ENCOUNTER — Inpatient Hospital Stay (HOSPITAL_COMMUNITY): Payer: Medicare PPO

## 2023-06-22 DIAGNOSIS — G936 Cerebral edema: Secondary | ICD-10-CM | POA: Diagnosis present

## 2023-06-22 DIAGNOSIS — D638 Anemia in other chronic diseases classified elsewhere: Secondary | ICD-10-CM | POA: Diagnosis present

## 2023-06-22 DIAGNOSIS — I43 Cardiomyopathy in diseases classified elsewhere: Secondary | ICD-10-CM | POA: Diagnosis present

## 2023-06-22 DIAGNOSIS — E854 Organ-limited amyloidosis: Secondary | ICD-10-CM | POA: Diagnosis present

## 2023-06-22 DIAGNOSIS — I480 Paroxysmal atrial fibrillation: Secondary | ICD-10-CM | POA: Diagnosis present

## 2023-06-22 DIAGNOSIS — Z1152 Encounter for screening for COVID-19: Secondary | ICD-10-CM | POA: Diagnosis not present

## 2023-06-22 DIAGNOSIS — L89629 Pressure ulcer of left heel, unspecified stage: Secondary | ICD-10-CM | POA: Diagnosis present

## 2023-06-22 DIAGNOSIS — Z66 Do not resuscitate: Secondary | ICD-10-CM | POA: Diagnosis present

## 2023-06-22 DIAGNOSIS — G9341 Metabolic encephalopathy: Secondary | ICD-10-CM | POA: Diagnosis present

## 2023-06-22 DIAGNOSIS — L89610 Pressure ulcer of right heel, unstageable: Secondary | ICD-10-CM | POA: Diagnosis present

## 2023-06-22 DIAGNOSIS — Z79899 Other long term (current) drug therapy: Secondary | ICD-10-CM | POA: Diagnosis not present

## 2023-06-22 DIAGNOSIS — Z923 Personal history of irradiation: Secondary | ICD-10-CM | POA: Diagnosis not present

## 2023-06-22 DIAGNOSIS — E119 Type 2 diabetes mellitus without complications: Secondary | ICD-10-CM | POA: Diagnosis present

## 2023-06-22 DIAGNOSIS — Z888 Allergy status to other drugs, medicaments and biological substances status: Secondary | ICD-10-CM | POA: Diagnosis not present

## 2023-06-22 DIAGNOSIS — D649 Anemia, unspecified: Secondary | ICD-10-CM | POA: Diagnosis not present

## 2023-06-22 DIAGNOSIS — I4891 Unspecified atrial fibrillation: Secondary | ICD-10-CM | POA: Diagnosis not present

## 2023-06-22 DIAGNOSIS — E8582 Wild-type transthyretin-related (ATTR) amyloidosis: Secondary | ICD-10-CM

## 2023-06-22 DIAGNOSIS — C50919 Malignant neoplasm of unspecified site of unspecified female breast: Secondary | ICD-10-CM | POA: Diagnosis not present

## 2023-06-22 DIAGNOSIS — E785 Hyperlipidemia, unspecified: Secondary | ICD-10-CM | POA: Diagnosis present

## 2023-06-22 DIAGNOSIS — C7931 Secondary malignant neoplasm of brain: Secondary | ICD-10-CM | POA: Diagnosis present

## 2023-06-22 DIAGNOSIS — C50912 Malignant neoplasm of unspecified site of left female breast: Secondary | ICD-10-CM | POA: Diagnosis present

## 2023-06-22 DIAGNOSIS — L89152 Pressure ulcer of sacral region, stage 2: Secondary | ICD-10-CM | POA: Diagnosis present

## 2023-06-22 DIAGNOSIS — I5032 Chronic diastolic (congestive) heart failure: Secondary | ICD-10-CM | POA: Diagnosis present

## 2023-06-22 DIAGNOSIS — I10 Essential (primary) hypertension: Secondary | ICD-10-CM

## 2023-06-22 DIAGNOSIS — Z9013 Acquired absence of bilateral breasts and nipples: Secondary | ICD-10-CM | POA: Diagnosis not present

## 2023-06-22 DIAGNOSIS — C787 Secondary malignant neoplasm of liver and intrahepatic bile duct: Secondary | ICD-10-CM | POA: Diagnosis present

## 2023-06-22 DIAGNOSIS — Z1501 Genetic susceptibility to malignant neoplasm of breast: Secondary | ICD-10-CM | POA: Diagnosis not present

## 2023-06-22 DIAGNOSIS — E876 Hypokalemia: Secondary | ICD-10-CM | POA: Diagnosis present

## 2023-06-22 DIAGNOSIS — I110 Hypertensive heart disease with heart failure: Secondary | ICD-10-CM | POA: Diagnosis present

## 2023-06-22 LAB — BLOOD CULTURE ID PANEL (REFLEXED) - BCID2

## 2023-06-22 LAB — CBC
HCT: 26.6 % — ABNORMAL LOW (ref 36.0–46.0)
Hemoglobin: 8.5 g/dL — ABNORMAL LOW (ref 12.0–15.0)
MCH: 29.1 pg (ref 26.0–34.0)
MCHC: 32 g/dL (ref 30.0–36.0)
MCV: 91.1 fL (ref 80.0–100.0)
Platelets: 279 10*3/uL (ref 150–400)
RBC: 2.92 MIL/uL — ABNORMAL LOW (ref 3.87–5.11)
RDW: 18.9 % — ABNORMAL HIGH (ref 11.5–15.5)
WBC: 7.3 10*3/uL (ref 4.0–10.5)
nRBC: 0 % (ref 0.0–0.2)

## 2023-06-22 LAB — GLUCOSE, CAPILLARY
Glucose-Capillary: 77 mg/dL (ref 70–99)
Glucose-Capillary: 151 mg/dL — ABNORMAL HIGH (ref 70–99)
Glucose-Capillary: 117 mg/dL — ABNORMAL HIGH (ref 70–99)
Glucose-Capillary: 109 mg/dL — ABNORMAL HIGH (ref 70–99)

## 2023-06-22 LAB — CULTURE, BLOOD (ROUTINE X 2)

## 2023-06-22 LAB — BASIC METABOLIC PANEL
Anion gap: 7 (ref 5–15)
BUN: 6 mg/dL — ABNORMAL LOW (ref 8–23)
CO2: 27 mmol/L (ref 22–32)
Calcium: 9.1 mg/dL (ref 8.9–10.3)
Chloride: 103 mmol/L (ref 98–111)
Creatinine, Ser: 0.49 mg/dL (ref 0.44–1.00)
GFR, Estimated: >60 mL/min (ref >60)
Glucose, Bld: 96 mg/dL (ref 70–99)
Potassium: 3.7 mmol/L (ref 3.5–5.1)
Sodium: 137 mmol/L (ref 135–145)

## 2023-06-22 MED ORDER — LACTATED RINGERS IV SOLN: INTRAVENOUS | Status: AC

## 2023-06-22 MED ORDER — VANCOMYCIN HCL 500 MG/100ML IV SOLN: 500.0000 mg | Freq: Two times a day (BID) | INTRAVENOUS | Status: DC

## 2023-06-22 MED ORDER — CHLORHEXIDINE GLUCONATE CLOTH 2 % EX PADS
6.0000 | MEDICATED_PAD | Freq: Every day | CUTANEOUS | Status: DC
  Administered 2023-06-22 – 2023-06-23 (×2): 6 via TOPICAL

## 2023-06-22 MED ORDER — SODIUM CHLORIDE 0.9 % IV SOLN
1.0000 g | INTRAVENOUS | Status: DC
  Administered 2023-06-22: 1 g via INTRAVENOUS
  Filled 2023-06-22: qty 10

## 2023-06-22 MED ORDER — DICLOFENAC SODIUM 1 % EX GEL
2.0000 g | CUTANEOUS | Status: DC | PRN
  Administered 2023-06-22 – 2023-06-23 (×2): 2 g via TOPICAL
  Filled 2023-06-22: qty 100

## 2023-06-22 MED ORDER — GADOBUTROL 1 MMOL/ML IV SOLN
7.0000 mL | Freq: Once | INTRAVENOUS | Status: AC | PRN
  Administered 2023-06-22: 7 mL via INTRAVENOUS

## 2023-06-22 MED ORDER — VANCOMYCIN HCL 1500 MG/300ML IV SOLN
1500.0000 mg | Freq: Once | INTRAVENOUS | Status: DC
  Filled 2023-06-22: qty 300

## 2023-06-22 MED ORDER — AMIODARONE HCL 200 MG PO TABS
100.0000 mg | ORAL_TABLET | Freq: Every day | ORAL | Status: DC
  Administered 2023-06-22 – 2023-06-23 (×2): 100 mg via ORAL
  Filled 2023-06-22 (×3): qty 1

## 2023-06-22 MED ORDER — PIPERACILLIN-TAZOBACTAM 3.375 G IVPB: 3.3750 g | Freq: Three times a day (TID) | INTRAVENOUS | Status: DC

## 2023-06-22 NOTE — Progress Notes (Signed)
VSS. Rounds and turns completed every two hours. Patient tolerated well. Patient in bed, resting without signs of distress at this time. All safety measures in place. Will continue to monitor

## 2023-06-22 NOTE — Progress Notes (Addendum)
Pharmacy Antibiotic Note  Amanda Green is a 88 y.o. female admitted on 06/21/2023 with a UTI.  Pharmacy has been consulted for Zosyn dosing.  Vancomycin was discontinued as blood cx likely a contaminant.   Plan: Zosyn 3.375 gm every 8 hours  Monitor renal function, blood cultures, vital signs, LOT  Height: 5\' 3"  (160 cm) Weight: 66.7 kg (147 lb 0.8 oz) IBW/kg (Calculated) : 52.4  Temp (24hrs), Avg:98.1 F (36.7 C), Min:97.3 F (36.3 C), Max:98.7 F (37.1 C)  Recent Labs  Lab 06/18/23 0940 06/21/23 1240 06/21/23 1513 06/21/23 1828 06/22/23 0253  WBC 6.7  --  8.0  --  7.3  CREATININE 0.36*  --  0.55  --  0.49  LATICACIDVEN  --  1.2  --  1.3  --     Estimated Creatinine Clearance: 46.3 mL/min (by C-G formula based on SCr of 0.49 mg/dL).    Allergies  Allergen Reactions   Neurontin [Gabapentin] Other (See Comments)    Drowsiness    Antimicrobials this admission: Ceftriaxone 06/21/23 x1 Zosyn 06/21/23 >>   Microbiology results: 06/21/23 Blood Cx >> 1/4 gram positive cocci 06/21/23 Blood Cx ID Panel >> staph epidermidis 06/21/23 SARS Coronavirus 2 PCR >> negative  Thank you for allowing pharmacy to be a part of this patient's care.  Enos Fling, PharmD PGY-1 Acute Care Pharmacy Resident 06/22/2023 9:43 AM

## 2023-06-22 NOTE — Progress Notes (Addendum)
HD#0 SUBJECTIVE:  Patient Summary: Amanda Green is a 88 y.o. with a pertinent PMH of metastatic breast cancer, cognitive impairment, TTR cardiac amyloidosis, and atrial fibrillation who presented with weakness and declining functional status and admitted for acute metabolic encephalopathy.   Overnight Events: No acute events overnight  Interim History: Patient was evaluated at bedside. On examination, patient is fatigued but awakened to voice and could follow instructions and answer simple questions but would fall asleep quickly after talking.  Per family, denied any fevers, nausea, vomiting, headache but did endorse a recent nonproductive cough.  OBJECTIVE:  Vital Signs: Vitals:   06/21/23 2320 06/22/23 0545 06/22/23 0622 06/22/23 0720  BP: 112/69 (!) 135/59  131/65  Pulse: 67 60  (!) 59  Resp:  15  18  Temp:  98.5 F (36.9 C)  98.4 F (36.9 C)  TempSrc:    Oral  SpO2:  100%  100%  Weight:   66.7 kg   Height:       Supplemental O2: Nasal Cannula SpO2: 100 % O2 Flow Rate (L/min): 2 L/min  Filed Weights   06/21/23 1234 06/22/23 0622  Weight: 60 kg 66.7 kg    Intake/Output Summary (Last 24 hours) at 06/22/2023 1135 Last data filed at 06/22/2023 1022 Gross per 24 hour  Intake 3305.03 ml  Output --  Net 3305.03 ml   Net IO Since Admission: 3,305.03 mL [06/22/23 1135]  Physical Exam: Physical Exam Constitutional:      Appearance: She is ill-appearing.  HENT:     Mouth/Throat:     Mouth: Mucous membranes are dry.  Cardiovascular:     Rate and Rhythm: Normal rate and regular rhythm.     Pulses: Normal pulses.  Pulmonary:     Effort: Pulmonary effort is normal.     Breath sounds: Normal breath sounds.  Musculoskeletal:     Right lower leg: No edema.     Left lower leg: No edema.  Neurological:     Mental Status: She is lethargic.     Cranial Nerves: Cranial nerves 2-12 are intact.     Comments: Patient did not know why she was being treated in the hospital  similar to admission.   Psychiatric:        Mood and Affect: Mood and affect normal.    CBC    Component Value Date/Time   WBC 7.3 06/22/2023 0253   RBC 2.92 (L) 06/22/2023 0253   HGB 8.5 (L) 06/22/2023 0253   HGB 10.4 (L) 06/18/2023 0940   HGB 12.3 02/07/2021 1050   HCT 26.6 (L) 06/22/2023 0253   HCT 38.0 02/07/2021 1050   PLT 279 06/22/2023 0253   PLT 329 06/18/2023 0940   MCV 91.1 06/22/2023 0253   MCV 88 02/07/2021 1050   MCH 29.1 06/22/2023 0253   MCHC 32.0 06/22/2023 0253   RDW 18.9 (H) 06/22/2023 0253   RDW 13.0 02/07/2021 1050   LYMPHSABS 1.2 06/18/2023 0940   LYMPHSABS 1.3 02/07/2021 1050   MONOABS 0.5 06/18/2023 0940   EOSABS 0.2 06/18/2023 0940   EOSABS 0.3 02/07/2021 1050   BASOSABS 0.1 06/18/2023 0940   BASOSABS 0.1 02/07/2021 1050   CMP     Component Value Date/Time   NA 137 06/22/2023 0253   NA 139 02/07/2021 1050   K 3.7 06/22/2023 0253   CL 103 06/22/2023 0253   CO2 27 06/22/2023 0253   GLUCOSE 96 06/22/2023 0253   BUN 6 (L) 06/22/2023 0253   BUN 17  02/07/2021 1050   CREATININE 0.49 06/22/2023 0253   CREATININE 0.36 (L) 06/18/2023 0940   CALCIUM 9.1 06/22/2023 0253   PROT 5.7 (L) 06/21/2023 1513   PROT 6.7 01/03/2022 1527   ALBUMIN 2.2 (L) 06/21/2023 1513   ALBUMIN 3.9 02/07/2021 1050   AST 107 (H) 06/21/2023 1513   AST 99 (H) 06/18/2023 0940   ALT 36 06/21/2023 1513   ALT 36 06/18/2023 0940   ALKPHOS 384 (H) 06/21/2023 1513   BILITOT 0.7 06/21/2023 1513   BILITOT 0.4 06/18/2023 0940   GFRNONAA >60 06/22/2023 0253   GFRNONAA >60 06/18/2023 0940     ASSESSMENT/PLAN:  Assessment: Principal Problem:   Acute metabolic encephalopathy Active Problems:   Cognitive impairment   Paroxysmal atrial fibrillation (HCC)   Controlled type 2 diabetes mellitus without complication, without long-term current use of insulin (HCC)   Neoplasm of brain causing mass effect on adjacent structures (HCC)   Cardiac amyloidosis (HCC)   Plan: #Acute  encephalopathy #Generalized weakness Etiology currently unknown but differential includes a metabolic, infectious, or structural cause.  No significant metabolic derangements were noted on her labs so less likely.  Toxic etiology less likely as Lyrica was the only centrally acting medication, which was stopped.  Urinalysis positive for pyuria but unclear if acute UTI or secondary to chronic Foley catheter placement in place from April 2024 to admission.  Possible pneumonia noted on x-ray less likely as WBC remains unelevated and patient is afebrile.  Blood culture positive for Staph epidermidis but likely a contaminant so vancomycin was discontinued. Head CT demonstrated solitary mass consistent with prior scans.  MRI ordered today to rule out changes in solitary mass, vasogenic edema, infarctions, or other structural causes of encephalopathy.   -ceftriaxone -MRI with and without contrast ordered -PT/OT eval -Continue supportive IV fluids  #Metastatic breast cancer History of invasive ductal carcinoma of the breast treated with mastectomy followed by trastuzumab.  In 2024, lesion found in brain that was treated with radiation therapy.  New multifocal bilateral bilobar hepatic metastasis found on 06/19/2023. Last seen by oncologist in June and scheduled to start Herceptin for HER2 positive breast cancer on Tuesday.  #Anemia  Baseline hemoglobin between 9-11 based on previous samples but family denies bloody/tarry stools or other signs of bleeding.  -Continue to trend CBC  - Anemia workup ordered (iron, ferritin, retic panel, LDH, haptoglobin)  #TTR amyloidosis #Atrial fibrillation #Hypertension Recently started on transthyretin stabilizer (tafamidis) which does not list encephalopathy as an adverse reaction.  Today was euvolemic on exam.  Not currently on anticoagulation due to brain tumor.  Restarted Amio and continued metoprolol. -Resume Amio 100 mg daily -Continue metoprolol 50 mg  daily -Eliquis discontinued with concern for bleeding risk secondary to brain tumor  Best Practice: IVF: Fluids: LR, Rate:  100 cc/hr x 14 hrs VTE: enoxaparin (LOVENOX) injection 40 mg Start: 06/21/23 2000 Code: DNR AB: Ceftriaxone 1 g DISPO: Anticipated discharge in 3 days to Home pending  medical management .  Signature: Morrie Sheldon, MD Internal Medicine Resident, PGY-1 [facility]  Pager: [phone]  Please contact the on call pager after 5 pm and on weekends at [phone].

## 2023-06-22 NOTE — Plan of Care (Signed)
VSS. Patient oriented to unit. Patient deferred admission questions r/t fatigue. Call bell at bedside. Bed in lowest position. All safety measures in place. Will continue to monitor. Problem: Activity: Goal: Risk for activity intolerance will decrease Outcome: Progressing   Problem: Nutrition: Goal: Adequate nutrition will be maintained Outcome: Progressing   Problem: Safety: Goal: Ability to remain free from injury will improve Outcome: Progressing

## 2023-06-22 NOTE — Plan of Care (Signed)
Problem: Education: Goal: Knowledge of General Education information will improve Description: Including pain rating scale, medication(s)/side effects and non-pharmacologic comfort measures Outcome: Progressing Pt understands she was admitted for weakness and lethargy for several days.  Problem: Clinical Measurements: Goal: Ability to maintain clinical measurements within normal limits will improve Outcome: Progressing VS and electrolytes WNL.   Problem: Clinical Measurements: Goal: Will remain free from infection Outcome: Progressing S/Sx of infection monitored and assessed q-shift.  Pt has remained afebrile thus far.  She is on IV abx per MD's orders.   Problem: Clinical Measurements: Goal: Respiratory complications will improve Outcome: Progressing Respiratory status monitored and assessed q-shift. Pt is on 2L of O2 via nasal cannula with O2 saturations at 100% and respiration rate of 16-18 breaths per minute. Pt has denied c/o SOB or DOE.   Problem: Activity: Goal: Risk for activity intolerance will decrease Outcome: Not Progressing Pt is dependent of all her ADLs.  She is bedbound and needs assistance of RN staff to turn/ reposition her.  PT/ OT came to elevate her today.  Please see their note.  Problem: Nutrition: Goal: Adequate nutrition will be maintained Outcome: Progressing Pt is on a regular diet per MD's orders.  She has been able to tolerate her diet well w/o s/sx of n/v, abdominal pain/ distention.   Problem: Elimination: Goal: Will not experience complications related to bowel motility Outcome: Progressing Pt had a BM this AM.  She is incontinent of her bowels.  Pt is checked q2 hours for bowel incontinence.  Perineal care given promptly after each episode.   Problem: Elimination: Goal: Will not experience complications related to urinary retention Outcome: Progressing Pt has a chronic history of urinary retention.  She was admitted from home with a foley  catheter.  CAUTI precautions implemented per [facility] policy, procedures and guidelines.   Problem: Pain Managment: Goal: General experience of comfort will improve Outcome: Progressing Pt continues to endorse c/o 8/10 bilateral leg pain describing it as a constant ache.  Reiterated pain scale so she could adequately rate her pain.  Pt stated her pain goal this admission would be 0/10.  Discussed nonpharmacological methods to help reduce s/sx of pain.  Interventions given per pt's request and MD's orders.      Problem: Safety: Goal: Ability to remain free from injury will improve Outcome: Progressing Pt has remained free from falls thus far. Instructed pt and family to utilize RN call light for assistance. Hourly rounds performed. Bed alarm implemented to keep pt safe from falls.  Settings activated to third most sensitive mode.  Bed in lowest position, locked with two upper side rails engaged. Belongings and call light within reach.      Problem: Skin Integrity: Goal: Risk for impaired skin integrity will decrease Outcome: Progressing Skin integrity monitor and assessed q-shift.  Pt is on q2 hourly turns to prevent further skin impairment.  Tubes and drains assessed for device related pressure sores.  t is dependent of all her ADLs.  She is bedbound and needs assistance of RN staff to turn/ reposition her.  PT/ OT came to elevate her today.  Please see their note.  Pt is incontinent of both her bowel and bladder.   Pt is checked q2 hours for bowel incontinence.  Perineal care given promptly after each episode. Pt has a chronic history of urinary retention.  She was admitted from home with a foley catheter.  CAUTI precautions implemented per [facility] policy, procedures and guidelines.  Pt has a right  heel and sacral wound.  WOC RN was consulted to assess pt's wounds.  Dressing changes performed per WOC RN and MD's orders.

## 2023-06-22 NOTE — Plan of Care (Signed)

## 2023-06-22 NOTE — Evaluation (Signed)
Physical Therapy Evaluation Patient Details Name: Amanda Green MRN: [id number] DOB: [birth year] Today's Date: 06/22/2023  History of Present Illness  Patient is 88 y.o. female who presented to the ED with concerns of declining mental status and generalized weakness. Family noted pt more tired, difficulty communicating, and BP low. PMH significant for asymptomatic atrial fibrillation, TTR amyloidosis, hypertension, hyperlipidemia, type 2 diabetes, CVA, breast cancer with brain mets, and cognitive impairment.   Clinical Impression  Amanda Green is 88 y.o. female admitted with above HPI and diagnosis. Patient is currently limited by functional impairments below (see PT problem list). Patient lives with daughters who are her primary caregivers and requires total assist for ADL's and max/Total assist for bed mobility with transfers via hoyer lift. Patient has good use of hands and was able to maintain seated balance with min guard for 1-2 minutes before requiring min assist to steady and max to return to supine in bed. Patient will benefit from continued skilled PT interventions to address impairments and progress independence with mobility. Acute PT will follow and progress as able.       Assistance Recommended at Discharge Frequent or constant Supervision/Assistance  If plan is discharge home, recommend the following:  Can travel by private vehicle  Two people to help with walking and/or transfers;Assistance with cooking/housework;Two people to help with bathing/dressing/bathroom;Assistance with feeding;Direct supervision/assist for medications management;Help with stairs or ramp for entrance;Assist for transportation;Direct supervision/assist for financial management        Equipment Recommendations None recommended by PT  Recommendations for Other Services       Functional Status Assessment Patient has had a recent decline in their functional status and demonstrates the ability to  make significant improvements in function in a reasonable and predictable amount of time.     Precautions / Restrictions Precautions Precautions: Fall Restrictions Weight Bearing Restrictions: No      Mobility  Bed Mobility Overal bed mobility: Needs Assistance Bed Mobility: Rolling, Sidelying to Sit, Sit to Supine Rolling: Max assist Sidelying to sit: Max assist, HOB elevated   Sit to supine: Max assist   General bed mobility comments: Max assist to roll Lt, cues and assist to facilitate reaching for bed rail. pt able to grip bed rail with bil UE to maintain upper trunk roll/Lt sidelying. Max to bring Le's off bed and to raise trunk. Pt leaning posterior/Lt into HOB, manual facilitation to position in Rt forearm prop and reach Rt UE laterally to improve seated balance. Pt able to hold for ~1-2 minutes at at time then fatiguing. Max assist to return to supine and +2 to boost in bed.    Transfers                   General transfer comment: NT    Ambulation/Gait                  Stairs            Wheelchair Mobility     Tilt Bed    Modified Rankin (Stroke Patients Only)       Balance Overall balance assessment: Needs assistance Sitting-balance support: Bilateral upper extremity supported, Feet supported Sitting balance-Leahy Scale: Poor Sitting balance - Comments: reliant on UE support, intermittent assist needed, pt able to maintain seated balance. with cues and manual facilitation of Rt lean. Postural control: Posterior lean  Pertinent Vitals/Pain Pain Assessment Pain Assessment: Faces Faces Pain Scale: Hurts little more Pain Location: Rt knee with mobility Pain Descriptors / Indicators: Discomfort Pain Intervention(s): Limited activity within patient's tolerance, Monitored during session, Repositioned    Home Living Family/patient expects to be discharged to:: Private residence Living  Arrangements: Children;Other relatives;Other (Comment) (2 daughters are primary caregivers and have assist from others as needed) Available Help at Discharge: Family;Available 24 hours/day Type of Home: House Home Access: Ramped entrance       Home Layout: One level Home Equipment: Agricultural consultant (2 wheels);Rollator (4 wheels);Cane - single point;Shower seat;BSC/3in1;Wheelchair - manual;Hospital bed (hoyer lift) Additional Comments: daughter reports family will be providing 24/7 assist (use medial transport for MD appointments. did have HH w/ [facility] then switched to hospice and stopped hospice about 1 wwek PTA.)    Prior Function Prior Level of Function : Needs assist             Mobility Comments: family has been using hoyer for bed<>chair transfers since ~April/June this year. ADLs Comments: total assist for all ADL's from daughters and aids.     Hand Dominance   Dominant Hand: Right    Extremity/Trunk Assessment             Cervical / Trunk Assessment Cervical / Trunk Assessment: Normal  Communication   Communication: No difficulties  Cognition Arousal/Alertness: Awake/alert Behavior During Therapy: WFL for tasks assessed/performed Overall Cognitive Status: History of cognitive impairments - at baseline                                 General Comments: pt able to get year without cues, need        General Comments      Exercises     Assessment/Plan    PT Assessment Patient needs continued PT services  PT Problem List Decreased strength;Decreased activity tolerance;Decreased range of motion;Decreased balance;Decreased mobility;Decreased cognition;Decreased knowledge of use of DME;Decreased safety awareness;Decreased knowledge of precautions;Obesity       PT Treatment Interventions DME instruction;Gait training;Stair training;Functional mobility training;Therapeutic activities;Therapeutic exercise;Balance training;Neuromuscular  re-education;Cognitive remediation;Patient/family education;Wheelchair mobility training    PT Goals (Current goals can be found in the Care Plan section)  Acute Rehab PT Goals Patient Stated Goal: regain some strength and return home PT Goal Formulation: With patient/family Time For Goal Achievement: 07/06/23 Potential to Achieve Goals: Fair    Frequency Min 2X/week     Co-evaluation               AM-PAC PT "6 Clicks" Mobility  Outcome Measure Help needed turning from your back to your side while in a flat bed without using bedrails?: Total Help needed moving from lying on your back to sitting on the side of a flat bed without using bedrails?: Total Help needed moving to and from a bed to a chair (including a wheelchair)?: Total Help needed standing up from a chair using your arms (e.g., wheelchair or bedside chair)?: Total Help needed to walk in hospital room?: Total Help needed climbing 3-5 steps with a railing? : Total 6 Click Score: 6    End of Session Equipment Utilized During Treatment: Gait belt Activity Tolerance: Patient tolerated treatment well;Patient limited by fatigue Patient left: in bed;with call bell/phone within reach;with family/visitor present Nurse Communication: Mobility status;Need for lift equipment PT Visit Diagnosis: Other abnormalities of gait and mobility (R26.89);Muscle weakness (generalized) (M62.81);Difficulty in walking, not elsewhere classified (R26.2)  Time: 1610-9604 PT Time Calculation (min) (ACUTE ONLY): 24 min   Charges:   PT Evaluation $PT Eval Low Complexity: 1 Low PT Treatments $Therapeutic Activity: 8-22 mins PT General Charges $$ ACUTE PT VISIT: 1 Visit         Wynn Maudlin, DPT Acute Rehabilitation Services Office [phone]  06/22/23 9:12 AM

## 2023-06-22 NOTE — Progress Notes (Signed)
PHARMACY - PHYSICIAN COMMUNICATION CRITICAL VALUE ALERT - BLOOD CULTURE IDENTIFICATION (BCID)  Amanda Green is an 88 y.o. female who presented to [facility] on 06/21/2023  Assessment:  33 yof with metastatic breast cancer presenting with AMS, suspected UTI. Initiated on ceftriaxone and vancomycin added with GPC on blood culture gram stain prior to BCID resulting. Now BCID resulted with 1/4 bottles with staph epi with methicillin resistance, most likely contaminant.  Name of physician (or Provider) Contacted: Elza Rafter ([facility])  Current antibiotics: ceftriaxone/vancomycin  Changes to prescribed antibiotics recommended:  Recommendations accepted by provider - continue ceftriaxone for UTI, d/c vancomycin  Results for orders placed or performed during the hospital encounter of 06/21/23  Blood Culture ID Panel (Reflexed) (Collected: 06/21/2023  1:15 PM)  Result Value Ref Range   Enterococcus faecalis NOT DETECTED NOT DETECTED   Enterococcus Faecium NOT DETECTED NOT DETECTED   Listeria monocytogenes NOT DETECTED NOT DETECTED   Staphylococcus species DETECTED (A) NOT DETECTED   Staphylococcus aureus (BCID) NOT DETECTED NOT DETECTED   Staphylococcus epidermidis DETECTED (A) NOT DETECTED   Staphylococcus lugdunensis NOT DETECTED NOT DETECTED   Streptococcus species NOT DETECTED NOT DETECTED   Streptococcus agalactiae NOT DETECTED NOT DETECTED   Streptococcus pneumoniae NOT DETECTED NOT DETECTED   Streptococcus pyogenes NOT DETECTED NOT DETECTED   A.calcoaceticus-baumannii NOT DETECTED NOT DETECTED   Bacteroides fragilis NOT DETECTED NOT DETECTED   Enterobacterales NOT DETECTED NOT DETECTED   Enterobacter cloacae complex NOT DETECTED NOT DETECTED   Escherichia coli NOT DETECTED NOT DETECTED   Klebsiella aerogenes NOT DETECTED NOT DETECTED   Klebsiella oxytoca NOT DETECTED NOT DETECTED   Klebsiella pneumoniae NOT DETECTED NOT DETECTED   Proteus species NOT DETECTED NOT DETECTED    Salmonella species NOT DETECTED NOT DETECTED   Serratia marcescens NOT DETECTED NOT DETECTED   Haemophilus influenzae NOT DETECTED NOT DETECTED   Neisseria meningitidis NOT DETECTED NOT DETECTED   Pseudomonas aeruginosa NOT DETECTED NOT DETECTED   Stenotrophomonas maltophilia NOT DETECTED NOT DETECTED   Candida albicans NOT DETECTED NOT DETECTED   Candida auris NOT DETECTED NOT DETECTED   Candida glabrata NOT DETECTED NOT DETECTED   Candida krusei NOT DETECTED NOT DETECTED   Candida parapsilosis NOT DETECTED NOT DETECTED   Candida tropicalis NOT DETECTED NOT DETECTED   Cryptococcus neoformans/gattii NOT DETECTED NOT DETECTED   Methicillin resistance mecA/C DETECTED (A) NOT DETECTED     Leia Alf, PharmD, BCPS Please check AMION for all [facility] contact numbers Clinical Pharmacist 06/22/2023 10:47 AM

## 2023-06-22 NOTE — Evaluation (Signed)
Occupational Therapy Evaluation Patient Details Name: Amanda Green MRN: [id number] DOB: September 03, 1935 Today's Date: 06/22/2023   History of Present Illness Patient is 88 y.o. female who presented to the ED with concerns of declining mental status and generalized weakness. Family noted pt more tired, difficulty communicating, and BP low. PMH significant for asymptomatic atrial fibrillation, TTR amyloidosis, hypertension, hyperlipidemia, type 2 diabetes, CVA, breast cancer with brain mets, and cognitive impairment.   Clinical Impression   At baseline, pt completes UB ADLs with Set up to Max assist, LB ADLs with Total assist of +1 to +2, and functional transfers with a Hoyer-style lift with Total assist +2. Pt is within 90% of baseline but now presents with decreased activity tolerance, increased B UE generalized weakness, decreased sitting balance and sitting tolerance during functional tasks, and decreased ability to participate in bed mobility during or in preparation for functional tasks. Pt currently demonstrates ability to complete UB ADLs with Min to Max assist, LB ADLs with Total assist +2, and bed mobility with Max assist. Pt will benefit from acute skilled OT services to address deficits outlined below, decrease caregiver burden, and increase pt safety and independence with participation in self feeding in sitting with back supported. Post acute discharge, pt will benefit from continued skilled OT services in the home to maximize rehab potential.      Recommendations for follow up therapy are one component of a multi-disciplinary discharge planning process, led by the attending physician.  Recommendations may be updated based on patient status, additional functional criteria and insurance authorization.   Assistance Recommended at Discharge Frequent or constant Supervision/Assistance  Patient can return home with the following Two people to help with walking and/or transfers;Two people to  help with bathing/dressing/bathroom;Assistance with cooking/housework;Assistance with feeding;Direct supervision/assist for medications management;Assist for transportation;Help with stairs or ramp for entrance;Direct supervision/assist for financial management (Set up for self feeding and positioning for self feeding to ensure pt in midline during meals)    Functional Status Assessment  Patient has had a recent decline in their functional status and demonstrates the ability to make significant improvements in function in a reasonable and predictable amount of time.  Equipment Recommendations  None recommended by OT    Recommendations for Other Services       Precautions / Restrictions Precautions Precautions: Fall Restrictions Weight Bearing Restrictions: No      Mobility Bed Mobility Overal bed mobility: Needs Assistance Bed Mobility: Rolling, Sidelying to Sit, Sit to Supine Rolling: Max assist Sidelying to sit: Max assist, HOB elevated   Sit to supine: Max assist   General bed mobility comments: Sidelying to sit/sit to supine functional level per PT report this day secondary to pt declining sitting EOB secondary to fatigue following skilled PT eval. OT to continue to address bed mobility during funcitonal tasks in future skilled OT session.    Transfers                   General transfer comment: Pt requires Hoyer lift for functional transfers at baseline.      Balance Overall balance assessment: Needs assistance Sitting-balance support: Bilateral upper extremity supported, Feet supported Sitting balance-Leahy Scale: Poor Sitting balance - Comments: Pt requiring assistance and use of pillows to maintain midline position supine in bed with HOB elevated. Postural control: Left lateral lean (in supine in bed with HOB elevated)  ADL either performed or assessed with clinical judgement   ADL Overall ADL's : Needs  assistance/impaired (within 90% of baseline) Eating/Feeding: Minimal assistance;Bed level Eating/Feeding Details (indicate cue type and reason): Pt requiring external lateral support to maintain position in midline from bed level with HOB elevated during functional tasks. Grooming: Minimal assistance;Bed level Grooming Details (indicate cue type and reason): Pt requiring external lateral support to maintain position in midline from bed level with HOB elevated during functional tasks. Upper Body Bathing: Maximal assistance;Bed level   Lower Body Bathing: Total assistance;Bed level;+2 for physical assistance;+2 for safety/equipment   Upper Body Dressing : Moderate assistance;Bed level   Lower Body Dressing: Total assistance;+2 for physical assistance;+2 for safety/equipment;Bed level     Toilet Transfer Details (indicate cue type and reason): Pt completes toileting in the bed at baseline. Toileting- Clothing Manipulation and Hygiene: Total assistance;+2 for physical assistance;+2 for safety/equipment;Bed level         General ADL Comments: Pt within 90% of baseline PLOF with ADLs. Pt presenting with decreased activity tolerance, decreased sitting tolerance, decreased sitting balance during functional tasks, and increased generalized B UE weakness affecting pt ability to assist with bed mobility or tolerate OOB activity in sitting.     Vision Baseline Vision/History: 1 Wears glasses (readers) Ability to See in Adequate Light: 0 Adequate (with glasses) Patient Visual Report: No change from baseline       Perception     Praxis Praxis Praxis tested?: Within functional limits    Pertinent Vitals/Pain Pain Assessment Pain Assessment: Faces Faces Pain Scale: Hurts little more Pain Location: Right knee (Pt and daughter report this is baseline.) Pain Descriptors / Indicators: Discomfort, Grimacing, Guarding Pain Intervention(s): Limited activity within patient's tolerance, Monitored  during session     Hand Dominance Right   Extremity/Trunk Assessment Upper Extremity Assessment Upper Extremity Assessment: Generalized weakness;RUE deficits/detail;LUE deficits/detail RUE Deficits / Details: Generalized weakness worse on Right. AROM Right shoulder flexion to approx. 70 degrees and AAROM Right shoulder flexion to approx. 100 degrees. Family reports pt with Left UE strength > Right UE strength at baseline. Decreased R UE fine and gross motor coordination. RUE Sensation: WNL RUE Coordination: decreased fine motor;decreased gross motor LUE Deficits / Details: Generalized weakness worse on Right. Family reports pt with Left UE strength > Right UE strength at baseline. LUE Sensation: WNL LUE Coordination: WNL   Lower Extremity Assessment Lower Extremity Assessment: Defer to PT evaluation   Cervical / Trunk Assessment Cervical / Trunk Assessment: Normal   Communication Communication Communication: No difficulties   Cognition Arousal/Alertness: Lethargic Behavior During Therapy: WFL for tasks assessed/performed Overall Cognitive Status: History of cognitive impairments - at baseline                                 General Comments: Pt lethargic with pt reporting fatigue following skilled PT evaluation. However, pt able to answer questions and participate in skilled OT eval. Pt pleasant throughout session. Pt oriented x4 with increased time. Pt demonstrates ability to follow 1 step directions consistently with increased time.     General Comments  VSS on RA throughout session. Pt's daughter present throughout session.    Exercises     Shoulder Instructions      Home Living Family/patient expects to be discharged to:: Private residence Living Arrangements: Children;Other relatives;Other (Comment) Available Help at Discharge: Family;Available 24 hours/day Type of Home: House Home Access: Ramped entrance  Home Layout: One level     Bathroom  Shower/Tub: Producer, television/film/video: Standard Bathroom Accessibility: Yes How Accessible: Accessible via walker Home Equipment: Rolling Walker (2 wheels);Rollator (4 wheels);Cane - single point;Shower seat;BSC/3in1;Wheelchair - manual;Hospital bed   Additional Comments: daughter reports family will be providing 24/7 assist      Prior Functioning/Environment Prior Level of Function : Needs assist             Mobility Comments: family has been using hoyer for bed<>chair transfers since ~April/June this year. ADLs Comments: Set up for self feeding and grooming, Mod to Max assist for UB dressing/bathing, and Total assist of +1 to +2 for all LB ADLs from daughters and aids.        OT Problem List: Decreased strength;Decreased activity tolerance;Impaired balance (sitting and/or standing);Decreased coordination      OT Treatment/Interventions: Self-care/ADL training;Therapeutic exercise;Patient/family education;Balance training;Therapeutic activities    OT Goals(Current goals can be found in the care plan section) Acute Rehab OT Goals Patient Stated Goal: to return home with assistance of daughters OT Goal Formulation: With patient/family Time For Goal Achievement: 07/06/23 Potential to Achieve Goals: Good ADL Goals Pt Will Perform Eating: with set-up;bed level (with patient demonstrating ability to maintain sitting in midline without external lateral support throughout meal) Pt/caregiver will Perform Home Exercise Program: Increased ROM;Increased strength;Both right and left upper extremity;With theraputty;With written HEP provided;With minimal assist (increased coordination, with AROM/AAROM)  OT Frequency: Min 1X/week    Co-evaluation              AM-PAC OT "6 Clicks" Daily Activity     Outcome Measure Help from another person eating meals?: A Little Help from another person taking care of personal grooming?: A Little Help from another person toileting, which  includes using toliet, bedpan, or urinal?: Total Help from another person bathing (including washing, rinsing, drying)?: A Lot Help from another person to put on and taking off regular upper body clothing?: A Lot Help from another person to put on and taking off regular lower body clothing?: Total 6 Click Score: 12   End of Session Nurse Communication: Mobility status;Other (comment) (Pt with limited ability to participate secondary to increased lethergy and fatigue following skilled PT eval.)  Activity Tolerance: Patient limited by fatigue;Patient limited by lethargy Patient left: in bed;with call bell/phone within reach;with family/visitor present  OT Visit Diagnosis: Muscle weakness (generalized) (M62.81)                Time: 1610-9604 OT Time Calculation (min): 17 min Charges:  OT General Charges $OT Visit: 1 Visit OT Evaluation $OT Eval Low Complexity: 1 Low  Yuval Nolet "Orson Eva., OTR/L, MA Acute Rehab [phone]   Lendon Colonel 06/22/2023, 11:04 AM

## 2023-06-23 ENCOUNTER — Encounter: Payer: Self-pay | Admitting: Hematology and Oncology

## 2023-06-23 DIAGNOSIS — G9341 Metabolic encephalopathy: Principal | ICD-10-CM

## 2023-06-23 DIAGNOSIS — C50919 Malignant neoplasm of unspecified site of unspecified female breast: Secondary | ICD-10-CM

## 2023-06-23 LAB — CBC
HCT: 26.4 % — ABNORMAL LOW (ref 36.0–46.0)
Hemoglobin: 8.5 g/dL — ABNORMAL LOW (ref 12.0–15.0)
MCH: 29.8 pg (ref 26.0–34.0)
MCHC: 32.2 g/dL (ref 30.0–36.0)
MCV: 92.6 fL (ref 80.0–100.0)
Platelets: 297 10*3/uL (ref 150–400)
RBC: 2.85 MIL/uL — ABNORMAL LOW (ref 3.87–5.11)
RDW: 18.7 % — ABNORMAL HIGH (ref 11.5–15.5)
WBC: 7.4 10*3/uL (ref 4.0–10.5)
nRBC: 0 % (ref 0.0–0.2)

## 2023-06-23 LAB — CULTURE, BLOOD (ROUTINE X 2): Special Requests: ADEQUATE

## 2023-06-23 LAB — RETIC PANEL
Immature Retic Fract: 27.6 % — ABNORMAL HIGH (ref 2.3–15.9)
RBC.: 2.9 MIL/uL — ABNORMAL LOW (ref 3.87–5.11)
Retic Count, Absolute: 52.8 10*3/uL (ref 19.0–186.0)
Retic Ct Pct: 1.8 % (ref 0.4–3.1)
Reticulocyte Hemoglobin: 25.2 pg — ABNORMAL LOW (ref >27.9)

## 2023-06-23 LAB — IRON AND TIBC
Iron: 20 ug/dL — ABNORMAL LOW (ref 28–170)
Saturation Ratios: 14 % (ref 10.4–31.8)
TIBC: 144 ug/dL — ABNORMAL LOW (ref 250–450)
UIBC: 124 ug/dL

## 2023-06-23 LAB — GLUCOSE, CAPILLARY
Glucose-Capillary: 89 mg/dL (ref 70–99)
Glucose-Capillary: 121 mg/dL — ABNORMAL HIGH (ref 70–99)

## 2023-06-23 LAB — LACTATE DEHYDROGENASE: LDH: 385 U/L — ABNORMAL HIGH (ref 98–192)

## 2023-06-23 LAB — FERRITIN: Ferritin: 926 ng/mL — ABNORMAL HIGH (ref 11–307)

## 2023-06-23 MED ORDER — MEDIHONEY WOUND/BURN DRESSING EX PSTE
1.0000 | PASTE | Freq: Every day | CUTANEOUS | Status: DC
  Administered 2023-06-23: 1 via TOPICAL
  Filled 2023-06-23: qty 44

## 2023-06-23 MED ORDER — HYDROCERIN EX CREA: 1.0000 | TOPICAL_CREAM | Freq: Every day | CUTANEOUS | 0 refills | Status: DC

## 2023-06-23 MED ORDER — HYDROCERIN EX CREA
TOPICAL_CREAM | Freq: Every day | CUTANEOUS | Status: DC
  Filled 2023-06-23: qty 113

## 2023-06-23 MED ORDER — MEDIHONEY WOUND/BURN DRESSING EX PSTE: 1.0000 | PASTE | Freq: Every day | CUTANEOUS | 0 refills | Status: AC

## 2023-06-23 NOTE — Plan of Care (Signed)
Problem: Education: Goal: Knowledge of General Education information will improve Description: Including pain rating scale, medication(s)/side effects and non-pharmacologic comfort measures Outcome: Progressing Pt understands she was admitted for weakness and lethargy for several days.  She is now awaiting d/c home.    Problem: Clinical Measurements: Goal: Ability to maintain clinical measurements within normal limits will improve Outcome: Progressing VS and electrolytes WNL.    Problem: Clinical Measurements: Goal: Will remain free from infection Outcome: Progressing S/Sx of infection monitored and assessed q-shift.  Pt has remained afebrile thus far.     Problem: Clinical Measurements: Goal: Respiratory complications will improve Outcome: Progressing Respiratory status monitored and assessed q-shift. Pt is on room air with O2 saturations at 95% and respiration rate of 18 breaths per minute. Pt has denied c/o SOB or DOE.    Problem: Activity: Goal: Risk for activity intolerance will decrease Outcome: Not Progressing Pt is dependent of all her ADLs.  She is bedbound and needs assistance of RN staff to turn/ reposition her.    Problem: Nutrition: Goal: Adequate nutrition will be maintained Outcome: Progressing Pt is on a regular diet per MD's orders.  She has been able to tolerate her diet well w/o s/sx of n/v, abdominal pain/ distention.    Problem: Elimination: Goal: Will not experience complications related to bowel motility Outcome: Progressing Pt had several BM this AM.  She is incontinent of her bowels.  Pt is checked q2 hours for bowel incontinence.  Perineal care given promptly after each episode.    Problem: Elimination: Goal: Will not experience complications related to urinary retention Outcome: Progressing Pt has a chronic history of urinary retention.  She was admitted from home with a foley catheter.  CAUTI precautions implemented per [facility] policy, procedures and  guidelines.    Problem: Pain Managment: Goal: General experience of comfort will improve Outcome: Progressing Pt continues to endorse c/o 8/10 bilateral leg pain describing it as a constant ache.  Reiterated pain scale so she could adequately rate her pain.  Pt stated her pain goal this admission would be 0/10.  Discussed nonpharmacological methods to help reduce s/sx of pain.  Interventions given per pt's request and MD's orders.        Problem: Safety: Goal: Ability to remain free from injury will improve Outcome: Progressing Pt has remained free from falls thus far. Instructed pt and family to utilize RN call light for assistance. Hourly rounds performed. Bed alarm implemented to keep pt safe from falls.  Settings activated to third most sensitive mode.  Bed in lowest position, locked with two upper side rails engaged. Belongings and call light within reach.      Problem: Skin Integrity: Goal: Risk for impaired skin integrity will decrease Outcome: Progressing Skin integrity monitor and assessed q-shift.  Pt is on q2 hourly turns to prevent further skin impairment.  Tubes and drains assessed for device related pressure sores.  t is dependent of all her ADLs.  She is bedbound and needs assistance of RN staff to turn/ reposition her.  PT/ OT came to elevate her today.  Please see their note.  Pt is incontinent of both her bowel and bladder.   Pt is checked q2 hours for bowel incontinence.  Perineal care given promptly after each episode. Pt has a chronic history of urinary retention.  She was admitted from home with a foley catheter.  CAUTI precautions implemented per [facility] policy, procedures and guidelines.  Pt has a right heel and sacral wound.  Dressing  changes performed per WOC RN and MD's orders.

## 2023-06-23 NOTE — Consult Note (Signed)
WOC Nurse Consult Note: Reason for Consult:Stage 2 pressure injury to sacrum, Unstageable pressure injury to right heel, recently healed pressure injury to left heel Wound type:pressure Pressure Injury POA: Yes Measurement:To be measured by Bedside RN and documented on Nursing Flow Sheet with application of next dressing today Wound bed:See photodocumentation of wounds provided by MD this morning Drainage (amount, consistency, odor) small serous from sacral stage 2, small light brown consistent with autolytic debridement of Unstageable PI to right heel Periwound: mild maceration at sacral stage 2 PI Dressing procedure/placement/frequency:Turning and repositioning is in place, I have added guidance to minimize time in the supine position. Bilateral pressure redistribution heel boots are ordered (Prevalon). Topical care to the recently healed right heel full thickness PI (at least and most likely a stage 3 PI)will be to cleanse daily and moisturize with Eucerin cream then place foot into the pressure redistribution heel boot provided. Topical care to the sacrum and right heel will be with antimicrobial wound gel, leptospermum Manuka honey (Medihoney). The right foot is to be placed into the provided pressure redistribution heel boot provided. Guidance is provided for placement of the sacral silicone foam to avoid moisture tracking by placing the "tip" of the dressing pointing away from the anus.  WOC nursing team will not follow, but will remain available to this patient, the nursing and medical teams.  Please re-consult if needed.  Thank you for inviting [place] to participate in this patient's Plan of Care.  Ladona Mow, MSN, RN, CNS, GNP, Leda Min, [employer], [employer] phone:  [phone]

## 2023-06-23 NOTE — Progress Notes (Signed)
Pt to be d/c home to self care with the assistance of Home Health.  Reviewed AVS with pt and her daughter.  Informed pt and daughter of changes to medication list, follow up appointments with MD, and prescriptions to be pick up from her preferred pharmacy.  Personally handed her a physical prescription of her medications as well.  Assisted pt in gathering her belongings.  Removed her PIV which was CDI and free from s/sx of infection upon removal.  Answered any pending questions.  Pt had no further questions.  She was transported off the unit via gurney escorted by 2 transporters from [facility] and family to the front entrance for d/c where ambulance awaited to take her home.  Pt d/c in stable condition.

## 2023-06-23 NOTE — Discharge Summary (Signed)
Name: Amanda Green MRN: [id number] DOB: Jan 17, 1935 88 y.o. PCP: [name], DO  Date of Admission: 06/21/2023 12:21 PM Date of Discharge:  06/23/2023 Attending Physician: Dr. Oswaldo Done  DISCHARGE DIAGNOSIS:  Primary Problem: Acute metabolic encephalopathy   Hospital Problems: Principal Problem:   Acute metabolic encephalopathy Active Problems:   Cognitive impairment   Paroxysmal atrial fibrillation (HCC)   Controlled type 2 diabetes mellitus without complication, without long-term current use of insulin (HCC)   Neoplasm of brain causing mass effect on adjacent structures [facility])   Cardiac amyloidosis (HCC)    DISCHARGE MEDICATIONS:   Allergies as of 06/23/2023       Reactions   Neurontin [gabapentin] Other (See Comments)   Drowsiness        Medication List     STOP taking these medications    pantoprazole 40 MG tablet Commonly known as: PROTONIX   pregabalin 75 MG capsule Commonly known as: LYRICA       TAKE these medications    acetaminophen 650 MG CR tablet Commonly known as: TYLENOL Take 650-1,300 mg by mouth 2 (two) times daily as needed for pain.   albuterol (2.5 MG/3ML) 0.083% nebulizer solution Commonly known as: PROVENTIL Take 2.5 mg by nebulization 2 (two) times daily as needed for wheezing or shortness of breath.   amiodarone 100 MG tablet Commonly known as: Pacerone Take 1 tablet (100 mg total) by mouth daily.   ASCORBIC ACID PO Take 1 tablet by mouth daily. Vitamin C, unknown strength.   brimonidine 0.2 % ophthalmic solution Commonly known as: ALPHAGAN Place 1 drop into both eyes daily.   diclofenac Sodium 1 % Gel Commonly known as: VOLTAREN Apply 1 Application topically 4 (four) times daily as needed (joint pain).   dorzolamide 2 % ophthalmic solution Commonly known as: TRUSOPT Place 1 drop into both eyes 2 (two) times daily.   furosemide 20 MG tablet Commonly known as: LASIX Take 1 tablet (20 mg total) by mouth  daily.   hydrocerin Crea Apply 1 Application topically daily. Apply to left leg and heel once daily, do not apply between digits   hydrochlorothiazide 12.5 MG tablet Commonly known as: HYDRODIURIL Take 12.5 mg by mouth daily.   leptospermum manuka honey Pste paste Apply 1 Application topically daily. Apply to right heel Unstageable PI and sacral stage 2 PI after cleansing Apply thin layer (3 mm) to wound.   losartan 25 MG tablet Commonly known as: COZAAR Take 1 tablet (25 mg total) by mouth daily.   MAGNESIUM OXIDE PO Take 1 tablet by mouth daily.   memantine 10 MG tablet Commonly known as: Namenda Take 1 tablet (10 mg total) by mouth 2 (two) times daily.   metoprolol tartrate 50 MG tablet Commonly known as: LOPRESSOR Take 1 tablet (50 mg total) by mouth 2 (two) times daily.   Multivitamin Women 50+ Tabs Take 1 tablet by mouth daily.   spironolactone 25 MG tablet Commonly known as: ALDACTONE Take 1 tablet (25 mg total) by mouth daily.   TURMERIC CURCUMIN PO Take 1 tablet by mouth daily.   VITAMIN B-12 PO Take 1 tablet by mouth daily.   Vyndamax 61 MG Caps Generic drug: Tafamidis Take 61 mg by mouth daily.               Discharge Care Instructions  (From admission, onward)           Start     Ordered   06/23/23 0000  Discharge wound care:  Comments: Cleanse the wounds, pat dry. Apply MediHoney, top with dry gauze and secure heel with a few turns of Kerlix roll gauze prior to placing foot into Prevalon boot.   For sacral wound: Secure sacral dressing with silicone foam for sacrum placed so that the 'tip" is oriented pointing away from the anus.   06/23/23 1153            DISPOSITION AND FOLLOW-UP:  Ms.[name] was discharged from [facility] in Stable condition. At the hospital follow up visit please address:  Acute encephalopathy: improved by the time of discharge; have arranged for home health PT/OT. Etiology  was likely secondary to new subcentimeter brain mets vs pyuria which was treated with a 3 day course of antibiotics  Metastatic breast cancer: Continue to follow up with oncology regarding treatment plans and further discussions about new MRI findings  Follow-up Recommendations: Consults: Oncology Labs:  None Studies: None New Medications: None  Follow-up Appointments:  Follow-up Information     [name], DO. Schedule an appointment as soon as possible for a visit.   Specialty: Internal Medicine Contact information: [address] [place] [address] [phone]                 HOSPITAL COURSE:  Patient Summary: Acute encephalopathy Hx of metastatic breast cancer Patient presented with declining functional status at home and acute encephalopathy. No metabolic or toxic ingestion has revealed itself as the etiology of this patient's encephalopathy. Could be infectious, as patient has pyuria on urinalysis (unclear if true UTI vs chronic colonization as patient has had a foley since April for urinary retention). Treated the pyuria with 3 days of antibiotics. 1/4 blood cultures grew staph epi, likely a contaminant. As far as structural etiologies, MRI brain revealed that the patient's largest metastatic lesion in the L occipital lobe has decreased in size, but she also has new subcentimeter lesions in the brain (0.4 cm lesion in R occiptal cortex, 0.4cm lesion in L temporal cortex, 0.3 cm lesion in L middle frontal gyrus). These new mets could explain the patients acute encephalopathy. Patient was improved by the time of discharge and recommended to continue with home health PT/OT.  Metastatic breast cancer History of IDC in 2022 that was treated with left mastectomy followed by trastuzumab.  In 2024 she was found to have a solitary mass in her brain, concerning for primary CNS malignancy versus mets from breast cancer.  This was treated in March 2024 with radiation  therapy.  Patient continues to follow with oncology and is to start Herceptin this coming week for HER2 positive breast cancer.  As notable above, the patient has new subcentimeter lesions in her brain consistent with metastasis.  Chronic HFpEF 2/2 wild-type TTR amyloidosis  Atrial fibrillation Patient is not on anticoagulation due to risk of bleeding with her brain tumor.  She remained euvolemic and well compensated during her hospitalization.  We continued her metoprolol and amiodarone, but held her transthyretin stabilizer, tafamidis, as it was unclear if this contributed to her acute encephalopathy.  No adverse effect of encephalopathy is listed for tafamidis, but she recently restarted this the day before presenting to the hospital.  Elevated Alk Phos Noted on cardiology visit 7/3 and ED admission.  Alk phos was 384.  AST was 107.  Etiology could be 2/2 to metastatic cancer versus amiodarone dose adjustments.  Diabetes Not currently on medications.  In February her last A1c was 6.5% not on medications  Pressure wounds The patient has a stage II pressure injury to the sacrum, recently healed pressure injury to left heel, and unstageable pressure injury to the right heel. Recommend frequent turning and off-loading. Topical care to the sacrum and right heel will be with antimicrobial wound gel, leptospermum Manuka honey (Medihoney) per wound care RN.    DISCHARGE INSTRUCTIONS:   Discharge Instructions     Call MD for:  persistant dizziness or light-headedness   Complete by: As directed    Call MD for:  redness, tenderness, or signs of infection (pain, swelling, redness, odor or green/yellow discharge around incision site)   Complete by: As directed    Call MD for:  temperature >100.4   Complete by: As directed    Diet general   Complete by: As directed    Discharge instructions   Complete by: As directed    Dear Ms. Alix,  You were in the hospital because you became quite weak. We  found that you had bacteria in your urine, and treated you with antibiotics for a few days and you had your foley catheter changed.   We also did an MRI of your brain which showed that the brain mass that you got radiation for has shrunk in size, but there are now 3 small new masses that were not there before. This could have been the cause of your new weakness. Please continue to follow up with your oncologist for your breast cancer, and to discuss these new findings.  We did not make any changes to your medications, aside from [facility] your lyrica, as this medication can cause some confusion/altered mental status. We will arrange for home health physical therapy and occupational therapy - they will help you build up your strength and endurance in your home.   We are glad you are feeling better!   Discharge wound care:   Complete by: As directed    Cleanse the wounds, pat dry. Apply MediHoney, top with dry gauze and secure heel with a few turns of Kerlix roll gauze prior to placing foot into Prevalon boot.   For sacral wound: Secure sacral dressing with silicone foam for sacrum placed so that the 'tip" is oriented pointing away from the anus.   Face-to-face encounter (required for Medicare/Medicaid patients)   Complete by: As directed    I Dinorah Masullo N Ilyas Lipsitz certify that this patient is under my care and that I, or a nurse practitioner or physician's assistant working with me, had a face-to-face encounter that meets the physician face-to-face encounter requirements with this patient on 06/23/2023. The encounter with the patient was in whole, or in part for the following medical condition(s) which is the primary reason for home health care (List medical condition): metastatic breast cancer and physical deconditioning   The encounter with the patient was in whole, or in part, for the following medical condition, which is the primary reason for home health care: physical deconditioning   I certify that,  based on my findings, the following services are medically necessary home health services: Physical therapy   Reason for Medically Necessary Home Health Services:  Therapy- Therapeutic Exercises to Increase Strength and Endurance Therapy- Home Adaptation to Facilitate Safety     My clinical findings support the need for the above services:  Cognitive impairments, dementia, or mental confusion  that make it unsafe to leave home Unsafe ambulation due to balance issues     Further, I certify that my clinical findings support that this patient is  homebound due to: Unable to leave home safely without assistance   Home Health   Complete by: As directed    To provide the following care/treatments:  PT OT     Increase activity slowly   Complete by: As directed        SUBJECTIVE:  Paticia Stack was evaluated at the bedside with her daughter present. She feels well and is eager to get home. She knows she is in the hospital, but is not sure why she was here.   Discharge Vitals:   BP 123/62 (BP Location: Right Arm)   Pulse 75   Temp 99.2 F (37.3 C) (Oral)   Resp 18   Ht 5\' 3"  (1.6 m)   Wt 66.7 kg   SpO2 95%   BMI 26.05 kg/m   OBJECTIVE:  General: Appears comfortable laying in bed Pulmonary: Normal effort.  Extremities: Bilateral ankles in Prevalon boots.  Skin: Warm and dry. Stage 2 pressure injury to sacrum, no drainage. Unstagable pressure ulcer to R heel, healing pressure injury to L heel Neuro: Awake, alert. Not oriented to situation. Follows instructions. Psych: Normal mood and affect   Pertinent Labs, Studies, and Procedures:     Latest Ref Rng & Units 06/23/2023    1:57 AM 06/22/2023    2:53 AM 06/21/2023    3:13 PM  CBC  WBC 4.0 - 10.5 K/uL 7.4  7.3  8.0   Hemoglobin 12.0 - 15.0 g/dL 8.5  8.5  9.5   Hematocrit 36.0 - 46.0 % 26.4  26.6  29.4   Platelets 150 - 400 K/uL 297  279  378        Latest Ref Rng & Units 06/22/2023    2:53 AM 06/21/2023    3:13 PM 06/18/2023     9:40 AM  CMP  Glucose 70 - 99 mg/dL 96  91  161   BUN 8 - 23 mg/dL 6  10  8    Creatinine 0.44 - 1.00 mg/dL 0.96  0.45  4.09   Sodium 135 - 145 mmol/L 137  141  137   Potassium 3.5 - 5.1 mmol/L 3.7  3.9  3.1   Chloride 98 - 111 mmol/L 103  98  98   CO2 22 - 32 mmol/L 27  32  33   Calcium 8.9 - 10.3 mg/dL 9.1  81.1  9.2   Total Protein 6.5 - 8.1 g/dL  5.7  5.4   Total Bilirubin 0.3 - 1.2 mg/dL  0.7  0.4   Alkaline Phos 38 - 126 U/L  384  433   AST 15 - 41 U/L  107  99   ALT 0 - 44 U/L  36  36     MR BRAIN W WO CONTRAST  Result Date: 06/22/2023 CLINICAL DATA:  History of breast cancer with brain metastases, altered mental status, lethargy. EXAM: MRI HEAD WITHOUT AND WITH CONTRAST TECHNIQUE: Multiplanar, multiecho pulse sequences of the brain and surrounding structures were obtained without and with intravenous contrast. CONTRAST:  7mL GADAVIST GADOBUTROL 1 MMOL/ML IV SOLN COMPARISON:  CT head 1 day prior, brain MRI 02/14/2023 FINDINGS: Brain: The 2.0 cm AP x 1.9 cm TV x 1.9 cm cc peripherally enhancing metastatic lesion in the left occipital lobe is decreased in size, previously measured 2.9 cm x 2.9 cm x 3.0 cm. Surrounding edema persists but has improved, with decreased mass effect on the surrounding structures and re-expansion of the occipital horn. A small amount of SWI signal  dropout within this lesion is consistent with intralesional hemorrhage. A 0.4 cm enhancing lesion in the right occipital cortex is new since the prior study (10-30). A 0.4 cm lesion in the left temporal cortex is new (10-23). A 0.3 cm lesion in the left middle frontal gyrus is new (10-28). A punctate focus of enhancement in the left occipital cortex inferior to the dominant lesion is unchanged (10-23). There is no evidence of acute intracranial hemorrhage, extra-axial fluid collection, or acute infarct. Background parenchymal volume is normal. The ventricles are otherwise stable in size. Background chronic small-vessel  ischemic changes in the supratentorial white matter and pons are stable. A small focus of SWI signal dropout in the right occipital lobe is unchanged, nonspecific. A probable small focus of nodular gray matter heterotopia along the body of the right lateral ventricle is unchanged. The pituitary and suprasellar region are normal. There is no midline shift. Vascular: Normal flow voids. Skull and upper cervical spine: Normal marrow signal. Sinuses/Orbits: Paranasal sinuses are clear. Bilateral lens implants are in place. The globes and orbits are otherwise unremarkable. Other: Is a left mastoid effusion, new/increased since the prior study. The imaged nasopharynx is unremarkable. IMPRESSION: Since the MRI from 02/14/2023, the largest metastatic lesion in the left occipital lobe has decreased in size with decreased surrounding edema, but there are three new subcentimeter lesions in the brain described above. Findings consistent with mixed response. Electronically Signed   By: Lesia Hausen M.D.   On: 06/22/2023 18:24   CT Head Wo Contrast  Result Date: 06/21/2023 CLINICAL DATA:  Neuro deficit, acute, stroke suspected. Weakness and lethargy. History of stroke and breast cancer. EXAM: CT HEAD WITHOUT CONTRAST TECHNIQUE: Contiguous axial images were obtained from the base of the skull through the vertex without intravenous contrast. RADIATION DOSE REDUCTION: This exam was performed according to the departmental dose-optimization program which includes automated exposure control, adjustment of the mA and/or kV according to patient size and/or use of iterative reconstruction technique. COMPARISON:  Head CT 03/30/2023 and MRI 02/14/2023 FINDINGS: Brain: There is mild residual left parieto-occipital vasogenic edema, decreased from the prior CT. There is a small amount of calcification associated with the known underlying mass. No new brain edema, acute infarct, acute hemorrhage, midline shift, or extra-axial fluid collection  is identified. Mild cerebral atrophy is within normal limits for age. Hypodensities elsewhere in the cerebral white matter bilaterally are unchanged and nonspecific but compatible with mild-to-moderate chronic small vessel ischemic disease. A 1 cm focus of subependymal gray matter heterotopia is again noted along the posterior body of the right lateral ventricle. Vascular: Calcified atherosclerosis at the skull base. No hyperdense vessel. Skull: No acute fracture or suspicious osseous lesion. Sinuses/Orbits: Paranasal sinuses and mastoid air cells are clear. Bilateral cataract extraction Other: None. IMPRESSION: 1. No evidence of acute intracranial abnormality. 2. Known left parieto-occipital mass with decreased, mild residual edema. 3. Mild-to-moderate chronic small vessel ischemic disease. Electronically Signed   By: Sebastian Ache M.D.   On: 06/21/2023 15:07   DG Foot 2 Views Right  Result Date: 06/21/2023 CLINICAL DATA:  Weakness. EXAM: RIGHT FOOT - 2 VIEW COMPARISON:  None Available. FINDINGS: No fracture. Significant narrowing with marginal osteophytes at the first metatarsophalangeal joint suspected to be osteoarthritis. Remaining joints normally spaced and aligned. Moderate-sized plantar calcaneal spur. There is soft tissue swelling most evident over the dorsal forefoot. No soft tissue air. IMPRESSION: 1. No fracture or acute skeletal finding. 2. Nonspecific soft tissue swelling. 3. 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 Plantar calcaneal spur. Electronically Signed   By: Amie Portland M.D.   On: 06/21/2023 13:26   DG Chest Portable 1 View  Result Date: 06/21/2023 CLINICAL DATA:  Weakness. EXAM: PORTABLE CHEST 1 VIEW COMPARISON:  04/01/2023 and older exams.  CT, 06/18/2023. FINDINGS: Left lung base opacity obscures the left hemidiaphragm consistent with a small effusion and associated atelectasis or infection. Atelectasis favored and appearance is without significant change  from the recent chest CT. Remainder of the lungs is clear. No right pleural effusion. No pneumothorax. Heart top-normal in size.  No mediastinal or hilar masses. Skeletal structures are grossly intact. Stable changes from right breast surgery. IMPRESSION: 1. Left lung base opacity consistent with a combination of pleural fluid and atelectasis. Pneumonia should be considered in the proper clinical setting. Overall, appearance is stable compared to the recent prior CT from 06/18/2023. Electronically Signed   By: Amie Portland M.D.   On: 06/21/2023 13:23     Signed: Elza Rafter, D.O.  Internal Medicine Resident, PGY-3 [facility]  Pager: [phone] 12:00 PM, 06/23/2023

## 2023-06-23 NOTE — TOC Transition Note (Signed)
Transition of Care [facility]) - CM/SW Discharge Note   Patient Details  Name: Amanda Green MRN: [id number] Date of Birth: 14-Oct-1935  Transition of Care [facility]) CM/SW Contact:  Ronny Bacon, RN Phone Number: 06/23/2023, 12:18 PM   Clinical Narrative:   Patient is being discharged home today. Spoke with patient by phone to discuss [facility] PT and OT options. Patient used [facility] in the past and is agreeable to use them again. PTAR arrangements made for transportation home. Patient confirmed daughter will be home to receive patient.     Final next level of care: Home w Home Health Services Barriers to Discharge: No Barriers Identified   Patient Goals and CMS Choice      Discharge Placement                         Discharge Plan and Services Additional resources added to the After Visit Summary for                            HH Arranged: PT, OT HH Agency: [facility] Date [facility] Agency Contacted: 06/23/23 Time HH Agency Contacted: 1214 Representative spoke with at [facility] Agency: Kandee Keen  Social Determinants of Health (SDOH) Interventions SDOH Screenings   Food Insecurity: No Food Insecurity (03/30/2023)  Housing: Low Risk  (03/30/2023)  Transportation Needs: No Transportation Needs (03/30/2023)  Utilities: Not At Risk (03/30/2023)  Tobacco Use: Low Risk  (06/21/2023)     Readmission Risk Interventions     No data to display

## 2023-06-24 LAB — CULTURE, BLOOD (ROUTINE X 2): Culture: NO GROWTH

## 2023-06-24 LAB — HAPTOGLOBIN: Haptoglobin: 242 mg/dL (ref 41–333)

## 2023-06-25 ENCOUNTER — Inpatient Hospital Stay (HOSPITAL_BASED_OUTPATIENT_CLINIC_OR_DEPARTMENT_OTHER): Payer: Medicare PPO | Admitting: Hematology and Oncology

## 2023-06-25 ENCOUNTER — Inpatient Hospital Stay: Payer: Medicare PPO

## 2023-06-25 VITALS — BP 133/73 | HR 68 | Temp 97.0°F | Resp 19 | Wt 142.3 lb

## 2023-06-25 DIAGNOSIS — C7931 Secondary malignant neoplasm of brain: Secondary | ICD-10-CM

## 2023-06-25 DIAGNOSIS — C773 Secondary and unspecified malignant neoplasm of axilla and upper limb lymph nodes: Secondary | ICD-10-CM | POA: Diagnosis present

## 2023-06-25 DIAGNOSIS — C50919 Malignant neoplasm of unspecified site of unspecified female breast: Secondary | ICD-10-CM

## 2023-06-25 DIAGNOSIS — C50912 Malignant neoplasm of unspecified site of left female breast: Secondary | ICD-10-CM | POA: Diagnosis present

## 2023-06-25 DIAGNOSIS — Z9013 Acquired absence of bilateral breasts and nipples: Secondary | ICD-10-CM | POA: Diagnosis not present

## 2023-06-25 LAB — CMP (CANCER CENTER ONLY)
ALT: 39 U/L (ref 0–44)
AST: 110 U/L — ABNORMAL HIGH (ref 15–41)
Albumin: 2.6 g/dL — ABNORMAL LOW (ref 3.5–5.0)
Alkaline Phosphatase: 427 U/L — ABNORMAL HIGH (ref 38–126)
Anion gap: 7 (ref 5–15)
BUN: 8 mg/dL (ref 8–23)
CO2: 29 mmol/L (ref 22–32)
Calcium: 10 mg/dL (ref 8.9–10.3)
Chloride: 102 mmol/L (ref 98–111)
Creatinine: 0.5 mg/dL (ref 0.44–1.00)
GFR, Estimated: >60 mL/min (ref >60)
Glucose, Bld: 115 mg/dL — ABNORMAL HIGH (ref 70–99)
Potassium: 4 mmol/L (ref 3.5–5.1)
Sodium: 138 mmol/L (ref 135–145)
Total Bilirubin: 0.4 mg/dL (ref 0.3–1.2)
Total Protein: 5.7 g/dL — ABNORMAL LOW (ref 6.5–8.1)

## 2023-06-25 LAB — CBC WITH DIFFERENTIAL (CANCER CENTER ONLY)
Abs Immature Granulocytes: 0.05 10*3/uL (ref 0.00–0.07)
Basophils Absolute: 0.1 10*3/uL (ref 0.0–0.1)
Basophils Relative: 1 %
Eosinophils Absolute: 0.3 10*3/uL (ref 0.0–0.5)
Eosinophils Relative: 5 %
HCT: 30.9 % — ABNORMAL LOW (ref 36.0–46.0)
Hemoglobin: 10 g/dL — ABNORMAL LOW (ref 12.0–15.0)
Immature Granulocytes: 1 %
Lymphocytes Relative: 18 %
Lymphs Abs: 1.1 10*3/uL (ref 0.7–4.0)
MCH: 29.6 pg (ref 26.0–34.0)
MCHC: 32.4 g/dL (ref 30.0–36.0)
MCV: 91.4 fL (ref 80.0–100.0)
Monocytes Absolute: 0.5 10*3/uL (ref 0.1–1.0)
Monocytes Relative: 7 %
Neutro Abs: 4.4 10*3/uL (ref 1.7–7.7)
Neutrophils Relative %: 68 %
Platelet Count: 320 10*3/uL (ref 150–400)
RBC: 3.38 MIL/uL — ABNORMAL LOW (ref 3.87–5.11)
RDW: 18.8 % — ABNORMAL HIGH (ref 11.5–15.5)
WBC Count: 6.4 10*3/uL (ref 4.0–10.5)
nRBC: 0 % (ref 0.0–0.2)

## 2023-06-25 NOTE — Assessment & Plan Note (Signed)
1999: Right mastectomy 03/04/2019: Left breast DCIS grade 3 ER positive PR negative, left lumpectomy: DCIS 2.7 cm tamoxifen discontinued April 2022, genetics negative 05/10/2021: Left mastectomy IDC ER/PR negative Ki67 15% HER2 positive 02/14/2023: Left occipital lobe brain metastases 3.1 cm, 9 mm right lateral ventricle mass 03/04/2023: SRS to brain 03/09/2023: Hospitalization for A-fib, CT PE protocol: Normal left breast/left axillary mass April 2024: Axillary lymph node biopsy: ER 0% PR 0% HER2 3+ positive 03/30/2023-04/02/2023: Hospitalization for lethargy, altered mental status, pneumonia and UTI 06/21/2023-06/23/2023: Hospitalization: Acute metabolic encephalopathy (UTI versus brain mets) -------------------------------------------------------------------------------------------------------------------------- Current treatment: Herceptin Hylecta injections: It appears that her secondary insurance did not approve these injections.  The only option would be to try the infusions.

## 2023-06-26 LAB — CULTURE, BLOOD (ROUTINE X 2): Special Requests: ADEQUATE

## 2023-06-26 NOTE — Progress Notes (Signed)
Location/Histology of Brain Tumor: Left Breast Cancer metastatic to Brain  Patient presented to the emergency room with complaints of drowsiness.  Imaging noted a lesion in the brain.  MRI Brain 06/22/2023: A 0.4 cm enhancing lesion in the right occipital cortex is new since the prior study.  A 0.4 cm lesion in the left temporal cortex is new.  A 0.3 cm lesion in the left middle frontal gyrus is new.  A punctate focus of enhancement in the left occipital cortex inferior to the dominant lesion is unchanged.    Past or anticipated interventions, if any, per neurosurgery:   Past or anticipated interventions, if any, per medical oncology:  Dr. Pamelia Hoit 06/25/2023 -Herceptin Hylecta injections: It appears that her secondary insurance did not approve these injections.  We will try and appeal this and see if we can get it approved  -Will request radiation oncology to see her for stereotactic radiosurgery for the brain mets.  Dose of Decadron, if applicable: n/a  Recent neurologic symptoms, if any:  Seizures:  Headaches:  Nausea:  Dizziness/ataxia:  Difficulty with hand coordination:  Focal numbness/weakness:  Visual deficits/changes:  Confusion/Memory deficits:     SAFETY ISSUES: Prior radiation? SRS Brain 02/2023, Left CW 02/2023 Pacemaker/ICD?  Possible current pregnancy? Hysterectomy Is the patient on methotrexate?   Additional Complaints / other details:

## 2023-06-27 ENCOUNTER — Encounter: Payer: Self-pay | Admitting: Radiation Oncology

## 2023-06-27 ENCOUNTER — Other Ambulatory Visit: Payer: Self-pay

## 2023-06-27 ENCOUNTER — Ambulatory Visit
Admission: RE | Admit: 2023-06-27 | Discharge: 2023-06-27 | Disposition: A | Discharge status: Home / Self Care | Payer: Medicare PPO | Source: Ambulatory Visit | Attending: Radiation Oncology | Admitting: Radiation Oncology

## 2023-06-27 VITALS — BP 150/109 | HR 70 | Temp 97.1°F | Resp 18 | Ht 63.0 in

## 2023-06-27 DIAGNOSIS — I10 Essential (primary) hypertension: Secondary | ICD-10-CM | POA: Diagnosis not present

## 2023-06-27 DIAGNOSIS — R7989 Other specified abnormal findings of blood chemistry: Secondary | ICD-10-CM | POA: Diagnosis not present

## 2023-06-27 DIAGNOSIS — E119 Type 2 diabetes mellitus without complications: Secondary | ICD-10-CM | POA: Diagnosis present

## 2023-06-27 DIAGNOSIS — A419 Sepsis, unspecified organism: Secondary | ICD-10-CM | POA: Diagnosis present

## 2023-06-27 DIAGNOSIS — C7931 Secondary malignant neoplasm of brain: Secondary | ICD-10-CM | POA: Insufficient documentation

## 2023-06-27 DIAGNOSIS — L89613 Pressure ulcer of right heel, stage 3: Secondary | ICD-10-CM | POA: Diagnosis present

## 2023-06-27 DIAGNOSIS — Z66 Do not resuscitate: Secondary | ICD-10-CM | POA: Diagnosis present

## 2023-06-27 DIAGNOSIS — L89152 Pressure ulcer of sacral region, stage 2: Secondary | ICD-10-CM | POA: Diagnosis present

## 2023-06-27 DIAGNOSIS — J9601 Acute respiratory failure with hypoxia: Secondary | ICD-10-CM | POA: Diagnosis present

## 2023-06-27 DIAGNOSIS — R918 Other nonspecific abnormal finding of lung field: Secondary | ICD-10-CM | POA: Insufficient documentation

## 2023-06-27 DIAGNOSIS — K7682 Hepatic encephalopathy: Secondary | ICD-10-CM | POA: Diagnosis present

## 2023-06-27 DIAGNOSIS — C50919 Malignant neoplasm of unspecified site of unspecified female breast: Secondary | ICD-10-CM | POA: Insufficient documentation

## 2023-06-27 DIAGNOSIS — G928 Other toxic encephalopathy: Secondary | ICD-10-CM | POA: Diagnosis present

## 2023-06-27 DIAGNOSIS — Z515 Encounter for palliative care: Secondary | ICD-10-CM | POA: Diagnosis not present

## 2023-06-27 DIAGNOSIS — R6520 Severe sepsis without septic shock: Secondary | ICD-10-CM | POA: Diagnosis present

## 2023-06-27 DIAGNOSIS — I43 Cardiomyopathy in diseases classified elsewhere: Secondary | ICD-10-CM | POA: Diagnosis present

## 2023-06-27 DIAGNOSIS — J189 Pneumonia, unspecified organism: Secondary | ICD-10-CM | POA: Diagnosis present

## 2023-06-27 DIAGNOSIS — J1569 Pneumonia due to other gram-negative bacteria: Secondary | ICD-10-CM | POA: Diagnosis present

## 2023-06-27 DIAGNOSIS — C787 Secondary malignant neoplasm of liver and intrahepatic bile duct: Secondary | ICD-10-CM | POA: Insufficient documentation

## 2023-06-27 DIAGNOSIS — Z79899 Other long term (current) drug therapy: Secondary | ICD-10-CM | POA: Insufficient documentation

## 2023-06-27 DIAGNOSIS — I700 Atherosclerosis of aorta: Secondary | ICD-10-CM | POA: Insufficient documentation

## 2023-06-27 DIAGNOSIS — Y95 Nosocomial condition: Secondary | ICD-10-CM | POA: Diagnosis present

## 2023-06-27 DIAGNOSIS — R4589 Other symptoms and signs involving emotional state: Secondary | ICD-10-CM | POA: Diagnosis not present

## 2023-06-27 DIAGNOSIS — Z923 Personal history of irradiation: Secondary | ICD-10-CM | POA: Insufficient documentation

## 2023-06-27 DIAGNOSIS — Z8673 Personal history of transient ischemic attack (TIA), and cerebral infarction without residual deficits: Secondary | ICD-10-CM | POA: Diagnosis not present

## 2023-06-27 DIAGNOSIS — E43 Unspecified severe protein-calorie malnutrition: Secondary | ICD-10-CM | POA: Diagnosis present

## 2023-06-27 DIAGNOSIS — E785 Hyperlipidemia, unspecified: Secondary | ICD-10-CM | POA: Diagnosis present

## 2023-06-27 DIAGNOSIS — I5032 Chronic diastolic (congestive) heart failure: Secondary | ICD-10-CM | POA: Diagnosis present

## 2023-06-27 DIAGNOSIS — K862 Cyst of pancreas: Secondary | ICD-10-CM | POA: Insufficient documentation

## 2023-06-27 DIAGNOSIS — C773 Secondary and unspecified malignant neoplasm of axilla and upper limb lymph nodes: Secondary | ICD-10-CM | POA: Diagnosis not present

## 2023-06-27 DIAGNOSIS — I480 Paroxysmal atrial fibrillation: Secondary | ICD-10-CM | POA: Diagnosis present

## 2023-06-27 DIAGNOSIS — I110 Hypertensive heart disease with heart failure: Secondary | ICD-10-CM | POA: Diagnosis present

## 2023-06-27 DIAGNOSIS — Z1152 Encounter for screening for COVID-19: Secondary | ICD-10-CM | POA: Diagnosis not present

## 2023-06-27 DIAGNOSIS — Z7189 Other specified counseling: Secondary | ICD-10-CM | POA: Diagnosis not present

## 2023-06-27 DIAGNOSIS — C50912 Malignant neoplasm of unspecified site of left female breast: Secondary | ICD-10-CM | POA: Diagnosis not present

## 2023-06-27 DIAGNOSIS — I482 Chronic atrial fibrillation, unspecified: Secondary | ICD-10-CM | POA: Diagnosis present

## 2023-06-27 DIAGNOSIS — I5030 Unspecified diastolic (congestive) heart failure: Secondary | ICD-10-CM | POA: Diagnosis not present

## 2023-06-27 DIAGNOSIS — D630 Anemia in neoplastic disease: Secondary | ICD-10-CM | POA: Diagnosis present

## 2023-06-27 DIAGNOSIS — E854 Organ-limited amyloidosis: Secondary | ICD-10-CM | POA: Diagnosis present

## 2023-06-27 DIAGNOSIS — F0390 Unspecified dementia without behavioral disturbance: Secondary | ICD-10-CM | POA: Diagnosis present

## 2023-06-27 NOTE — Progress Notes (Signed)
Radiation Oncology         [phone]) [phone] ________________________________  Name: Amanda Green MRN: [id number]  Date: 06/27/2023  DOB: [birth year]  Follow-Up Visit Note  CC: Ollen Bowl, MD  Serena Croissant, MD  Diagnosis:   Brain metastasis, stage IV breast cancer  Interval Since Last Radiation: 4 months  Narrative:  The patient returns today for routine follow-up.  The patient previously completed postoperative radiation treatment to the resection cavity within the brain as well as palliative treatment to a dose of 30 Gray in 10 fractions to the left axilla.  This was completed in March earlier this year.  Recent MRI scan of the brain shows shrinkage of the left occipital region which was treated previously.  However, 3 new subcentimeter brain metastases were seen.  Given these findings, I have been asked to see the patient for consideration of radiosurgery to these lesions.                              ALLERGIES:  is allergic to neurontin [gabapentin].  Meds: Current Outpatient Medications  Medication Sig Dispense Refill   acetaminophen (TYLENOL) 650 MG CR tablet Take 650-1,300 mg by mouth 2 (two) times daily as needed for pain.     albuterol (PROVENTIL) (2.5 MG/3ML) 0.083% nebulizer solution Take 2.5 mg by nebulization 2 (two) times daily as needed for wheezing or shortness of breath.     amiodarone (PACERONE) 100 MG tablet Take 1 tablet (100 mg total) by mouth daily. 90 tablet 3   ASCORBIC ACID PO Take 1 tablet by mouth daily. Vitamin C, unknown strength.     brimonidine (ALPHAGAN) 0.2 % ophthalmic solution Place 1 drop into both eyes daily.     Cyanocobalamin (VITAMIN B-12 PO) Take 1 tablet by mouth daily.     diclofenac Sodium (VOLTAREN) 1 % GEL Apply 1 Application topically 4 (four) times daily as needed (joint pain).     dorzolamide (TRUSOPT) 2 % ophthalmic solution Place 1 drop into both eyes 2 (two) times daily.     furosemide (LASIX) 20 MG tablet Take 1 tablet (20 mg  total) by mouth daily. 30 tablet 0   hydrocerin (EUCERIN) CREA Apply 1 Application topically daily. Apply to left leg and heel once daily, do not apply between digits 113 g 0   hydrochlorothiazide (HYDRODIURIL) 12.5 MG tablet Take 12.5 mg by mouth daily.     leptospermum manuka honey (MEDIHONEY) PSTE paste Apply 1 Application topically daily. Apply to right heel Unstageable PI and sacral stage 2 PI after cleansing Apply thin layer (3 mm) to wound. 30 mL 0   losartan (COZAAR) 25 MG tablet Take 1 tablet (25 mg total) by mouth daily. 30 tablet 0   MAGNESIUM OXIDE PO Take 1 tablet by mouth daily.     memantine (NAMENDA) 10 MG tablet Take 1 tablet (10 mg total) by mouth 2 (two) times daily. 180 tablet 3   metoprolol tartrate (LOPRESSOR) 50 MG tablet Take 1 tablet (50 mg total) by mouth 2 (two) times daily. 180 tablet 3   Multiple Vitamins-Minerals (MULTIVITAMIN WOMEN 50+) TABS Take 1 tablet by mouth daily.     spironolactone (ALDACTONE) 25 MG tablet Take 1 tablet (25 mg total) by mouth daily. 30 tablet 5   Tafamidis (VYNDAMAX) 61 MG CAPS Take 61 mg by mouth daily. 30 capsule 12   TURMERIC CURCUMIN PO Take 1 tablet by mouth daily.  No current facility-administered medications for this encounter.    Physical Findings: The patient is in no acute distress. Patient is alert and oriented.  height is 5\' 3"  (1.6 m). Her temporal temperature is 97.1 F (36.2 C) (abnormal). Her blood pressure is 150/109 (abnormal) and her pulse is 70. Her respiration is 18 and oxygen saturation is 100%. .     Lab Findings: Lab Results  Component Value Date   WBC 6.4 06/25/2023   HGB 10.0 (L) 06/25/2023   HCT 30.9 (L) 06/25/2023   MCV 91.4 06/25/2023   PLT 320 06/25/2023     Radiographic Findings: MR BRAIN W WO CONTRAST  Result Date: 06/22/2023 CLINICAL DATA:  History of breast cancer with brain metastases, altered mental status, lethargy. EXAM: MRI HEAD WITHOUT AND WITH CONTRAST TECHNIQUE: Multiplanar,  multiecho pulse sequences of the brain and surrounding structures were obtained without and with intravenous contrast. CONTRAST:  7mL GADAVIST GADOBUTROL 1 MMOL/ML IV SOLN COMPARISON:  CT head 1 day prior, brain MRI 02/14/2023 FINDINGS: Brain: The 2.0 cm AP x 1.9 cm TV x 1.9 cm cc peripherally enhancing metastatic lesion in the left occipital lobe is decreased in size, previously measured 2.9 cm x 2.9 cm x 3.0 cm. Surrounding edema persists but has improved, with decreased mass effect on the surrounding structures and re-expansion of the occipital horn. A small amount of SWI signal dropout within this lesion is consistent with intralesional hemorrhage. A 0.4 cm enhancing lesion in the right occipital cortex is new since the prior study (10-30). A 0.4 cm lesion in the left temporal cortex is new (10-23). A 0.3 cm lesion in the left middle frontal gyrus is new (10-28). A punctate focus of enhancement in the left occipital cortex inferior to the dominant lesion is unchanged (10-23). There is no evidence of acute intracranial hemorrhage, extra-axial fluid collection, or acute infarct. Background parenchymal volume is normal. The ventricles are otherwise stable in size. Background chronic small-vessel ischemic changes in the supratentorial white matter and pons are stable. A small focus of SWI signal dropout in the right occipital lobe is unchanged, nonspecific. A probable small focus of nodular gray matter heterotopia along the body of the right lateral ventricle is unchanged. The pituitary and suprasellar region are normal. There is no midline shift. Vascular: Normal flow voids. Skull and upper cervical spine: Normal marrow signal. Sinuses/Orbits: Paranasal sinuses are clear. Bilateral lens implants are in place. The globes and orbits are otherwise unremarkable. Other: Is a left mastoid effusion, new/increased since the prior study. The imaged nasopharynx is unremarkable. IMPRESSION: Since the MRI from 02/14/2023, the  largest metastatic lesion in the left occipital lobe has decreased in size with decreased surrounding edema, but there are three new subcentimeter lesions in the brain described above. Findings consistent with mixed response. Electronically Signed   By: Lesia Hausen M.D.   On: 06/22/2023 18:24   CT Head Wo Contrast  Result Date: 06/21/2023 CLINICAL DATA:  Neuro deficit, acute, stroke suspected. Weakness and lethargy. History of stroke and breast cancer. EXAM: CT HEAD WITHOUT CONTRAST TECHNIQUE: Contiguous axial images were obtained from the base of the skull through the vertex without intravenous contrast. RADIATION DOSE REDUCTION: This exam was performed according to the departmental dose-optimization program which includes automated exposure control, adjustment of the mA and/or kV according to patient size and/or use of iterative reconstruction technique. COMPARISON:  Head CT 03/30/2023 and MRI 02/14/2023 FINDINGS: Brain: There is mild residual left parieto-occipital vasogenic edema, decreased from the prior CT.  There is a small amount of calcification associated with the known underlying mass. No new brain edema, acute infarct, acute hemorrhage, midline shift, or extra-axial fluid collection is identified. Mild cerebral atrophy is within normal limits for age. Hypodensities elsewhere in the cerebral white matter bilaterally are unchanged and nonspecific but compatible with mild-to-moderate chronic small vessel ischemic disease. A 1 cm focus of subependymal gray matter heterotopia is again noted along the posterior body of the right lateral ventricle. Vascular: Calcified atherosclerosis at the skull base. No hyperdense vessel. Skull: No acute fracture or suspicious osseous lesion. Sinuses/Orbits: Paranasal sinuses and mastoid air cells are clear. Bilateral cataract extraction Other: None. IMPRESSION: 1. No evidence of acute intracranial abnormality. 2. Known left parieto-occipital mass with decreased, mild  residual edema. 3. Mild-to-moderate chronic small vessel ischemic disease. Electronically Signed   By: Sebastian Ache M.D.   On: 06/21/2023 15:07   DG Foot 2 Views Right  Result Date: 06/21/2023 CLINICAL DATA:  Weakness. EXAM: RIGHT FOOT - 2 VIEW COMPARISON:  None Available. FINDINGS: No fracture. Significant narrowing with marginal osteophytes at the first metatarsophalangeal joint suspected to be osteoarthritis. Remaining joints normally spaced and aligned. Moderate-sized plantar calcaneal spur. There is soft tissue swelling most evident over the dorsal forefoot. No soft tissue air. IMPRESSION: 1. No fracture or acute skeletal finding. 2. Nonspecific soft tissue swelling. 3. 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 Plantar calcaneal spur. Electronically Signed   By: Amie Portland M.D.   On: 06/21/2023 13:26   DG Chest Portable 1 View  Result Date: 06/21/2023 CLINICAL DATA:  Weakness. EXAM: PORTABLE CHEST 1 VIEW COMPARISON:  04/01/2023 and older exams.  CT, 06/18/2023. FINDINGS: Left lung base opacity obscures the left hemidiaphragm consistent with a small effusion and associated atelectasis or infection. Atelectasis favored and appearance is without significant change from the recent chest CT. Remainder of the lungs is clear. No right pleural effusion. No pneumothorax. Heart top-normal in size.  No mediastinal or hilar masses. Skeletal structures are grossly intact. Stable changes from right breast surgery. IMPRESSION: 1. Left lung base opacity consistent with a combination of pleural fluid and atelectasis. Pneumonia should be considered in the proper clinical setting. Overall, appearance is stable compared to the recent prior CT from 06/18/2023. Electronically Signed   By: Amie Portland M.D.   On: 06/21/2023 13:23   CT CHEST ABDOMEN PELVIS W CONTRAST  Result Date: 06/19/2023 CLINICAL DATA:  Metastatic disease evaluation. Breast carcinoma with brain metastasis. * Tracking Code:  BO * EXAM: CT CHEST, ABDOMEN, AND PELVIS WITH CONTRAST TECHNIQUE: Multidetector CT imaging of the chest, abdomen and pelvis was performed following the standard protocol during bolus administration of intravenous contrast. RADIATION DOSE REDUCTION: This exam was performed according to the departmental dose-optimization program which includes automated exposure control, adjustment of the mA and/or kV according to patient size and/or use of iterative reconstruction technique. CONTRAST:  OMNIPAQUE IOHEXOL 300 MG/ML  SOLN COMPARISON:  CT chest abdomen pelvis 02/14/2023, MRI 03/11/2006 FINDINGS: CT CHEST FINDINGS Cardiovascular: No significant vascular findings. Normal heart size. No pericardial effusion. Mediastinum/Nodes: Nodular mass in the high LEFT axilla measures 2 cm decreased from 3.6 cm on CT chest 03/09/2023. No mediastinal or hilar adenopathy. No pericardial fluid. Esophagus normal. Lungs/Pleura: Bilateral pleural effusions with small effusion on the RIGHT and small to moderate effusion LEFT. There is passive atelectasis lower lobes. No suspicious pulmonary nodularity. Musculoskeletal: No skeletal metastasis. CT ABDOMEN AND PELVIS FINDINGS Hepatobiliary: New multifocal hypoenhancing round lesions of LEFT and  RIGHT hepatic lobe. Approximately 40 lesions in liver. Example lesion the caudate lobe measures 2.1 cm (image 47). Example lesion inferior RIGHT hepatic lobe measures 2.4 cm (image 61). High-density within the gallbladder sludge sludge. No biliary duct dilatation. Pancreas: Cystic lesion along the genu of the pancreas measures 13 mm compared to 13 mm on comparison CT. No pancreatic duct dilatation. Smaller cystic lesion in the head of the pancreas measuring 10 mm. Spleen: Low-density lesion in the spleen is favored a benign cyst or hemangioma. Adrenals/urinary tract: Enlargement of the LEFT adrenal gland to 15 mm not changed from comparison exam. Lesion identified as benign adenoma on MRI 2007. The  kidneys, ureters and bladder normal. Foley catheter within the bladder Stomach/Bowel: The stomach, duodenum, and small bowel normal. Partial resection of the ascending colon. Multiple diverticula of the descending colon and sigmoid colon without acute inflammation. Vascular/Lymphatic: Abdominal aorta is normal caliber with atherosclerotic calcification. There is no retroperitoneal or periportal lymphadenopathy. No pelvic lymphadenopathy. Reproductive: Uterus and adnexa unremarkable. Other: No free fluid. Musculoskeletal: No aggressive osseous lesion. IMPRESSION: CHEST: 1. Decreased size of LEFT axillary mass. 2. No evidence of pulmonary metastatic disease. 3. Bilateral pleural effusions with passive atelectasis. PELVIS: 1. New multifocal bilobar HEPATIC METASTASIS. 2. Stable LEFT adrenal gland adenoma characterized on MRI 03/11/2026 3. Stable cystic lesions in the pancreas. Recommend attention on routine follow-up. 4. Colonic diverticulosis without evidence diverticulitis. 5.  Aortic Atherosclerosis (ICD10-I70.0). Electronically Signed   By: Genevive Bi M.D.   On: 06/19/2023 13:07    Impression/ Plan:    The patient was seen today for her diagnosis of stage IV breast cancer with brain metastases.  Recent MRI scan shows 3 new small brain metastases and each of these are very amenable for a course of radiosurgery.  I would anticipate 1 fraction to a dose of 20 Gray to each of these lesions.  This treatment was discussed with the patient and her family once again today.  We discussed the rationale of treatment as well as possible side effects and risks.  All of her questions were answered and she does indicate that she wishes to proceed with such a treatment.  Therefore will proceed with simulation in the near future for treatment planning.  The patient was seen in person today in clinic.  The total time spent on the patient's visit today was 30 minutes, including chart review, direct discussion/evaluation  with the patient, and coordination of care.      Radene Gunning, M.D., Ph.D.

## 2023-06-28 ENCOUNTER — Encounter: Payer: Self-pay | Admitting: Hematology and Oncology

## 2023-06-28 ENCOUNTER — Telehealth: Payer: Self-pay

## 2023-06-28 DIAGNOSIS — C7931 Secondary malignant neoplasm of brain: Secondary | ICD-10-CM | POA: Diagnosis not present

## 2023-06-28 DIAGNOSIS — C773 Secondary and unspecified malignant neoplasm of axilla and upper limb lymph nodes: Secondary | ICD-10-CM | POA: Diagnosis not present

## 2023-06-28 DIAGNOSIS — C50912 Malignant neoplasm of unspecified site of left female breast: Secondary | ICD-10-CM | POA: Diagnosis not present

## 2023-06-28 NOTE — Telephone Encounter (Signed)
Received a call from Joy of [employer] regarding appeal for Pt's Herceptin. Prior authorization for Herceptin was recently denied. Joy states that there will be a panel meeting to discuss the appeal on 06/28/23 at 1015 and invites MD/RN to join. Joy states that MD attendance is not required. Message relayed to MD who will attempt to attend. The number to call including meeting ID is listed below:   [phone]  ID: [phone]#

## 2023-06-29 ENCOUNTER — Other Ambulatory Visit: Payer: Self-pay

## 2023-06-29 ENCOUNTER — Emergency Department (HOSPITAL_COMMUNITY): Payer: Medicare PPO

## 2023-06-29 ENCOUNTER — Inpatient Hospital Stay (HOSPITAL_COMMUNITY)
Admission: EM | Admit: 2023-06-29 | Discharge: 2023-07-03 | DRG: 871 | Disposition: A | Discharge status: Home Health | Payer: Medicare PPO | Attending: Family Medicine | Admitting: Family Medicine

## 2023-06-29 DIAGNOSIS — Z8673 Personal history of transient ischemic attack (TIA), and cerebral infarction without residual deficits: Secondary | ICD-10-CM

## 2023-06-29 DIAGNOSIS — E43 Unspecified severe protein-calorie malnutrition: Secondary | ICD-10-CM | POA: Diagnosis present

## 2023-06-29 DIAGNOSIS — I480 Paroxysmal atrial fibrillation: Secondary | ICD-10-CM | POA: Diagnosis present

## 2023-06-29 DIAGNOSIS — Z923 Personal history of irradiation: Secondary | ICD-10-CM

## 2023-06-29 DIAGNOSIS — Z87440 Personal history of urinary (tract) infections: Secondary | ICD-10-CM

## 2023-06-29 DIAGNOSIS — C50919 Malignant neoplasm of unspecified site of unspecified female breast: Secondary | ICD-10-CM | POA: Diagnosis not present

## 2023-06-29 DIAGNOSIS — I482 Chronic atrial fibrillation, unspecified: Secondary | ICD-10-CM | POA: Diagnosis present

## 2023-06-29 DIAGNOSIS — Z800 Family history of malignant neoplasm of digestive organs: Secondary | ICD-10-CM

## 2023-06-29 DIAGNOSIS — Y95 Nosocomial condition: Secondary | ICD-10-CM | POA: Diagnosis present

## 2023-06-29 DIAGNOSIS — L89613 Pressure ulcer of right heel, stage 3: Secondary | ICD-10-CM | POA: Diagnosis present

## 2023-06-29 DIAGNOSIS — G928 Other toxic encephalopathy: Secondary | ICD-10-CM | POA: Diagnosis present

## 2023-06-29 DIAGNOSIS — R7989 Other specified abnormal findings of blood chemistry: Secondary | ICD-10-CM

## 2023-06-29 DIAGNOSIS — E785 Hyperlipidemia, unspecified: Secondary | ICD-10-CM | POA: Insufficient documentation

## 2023-06-29 DIAGNOSIS — Z853 Personal history of malignant neoplasm of breast: Secondary | ICD-10-CM

## 2023-06-29 DIAGNOSIS — C7931 Secondary malignant neoplasm of brain: Secondary | ICD-10-CM | POA: Diagnosis present

## 2023-06-29 DIAGNOSIS — Z9013 Acquired absence of bilateral breasts and nipples: Secondary | ICD-10-CM

## 2023-06-29 DIAGNOSIS — E119 Type 2 diabetes mellitus without complications: Secondary | ICD-10-CM | POA: Insufficient documentation

## 2023-06-29 DIAGNOSIS — F0390 Unspecified dementia without behavioral disturbance: Secondary | ICD-10-CM | POA: Diagnosis present

## 2023-06-29 DIAGNOSIS — R6520 Severe sepsis without septic shock: Secondary | ICD-10-CM | POA: Diagnosis present

## 2023-06-29 DIAGNOSIS — J1569 Pneumonia due to other gram-negative bacteria: Secondary | ICD-10-CM | POA: Diagnosis present

## 2023-06-29 DIAGNOSIS — Z90710 Acquired absence of both cervix and uterus: Secondary | ICD-10-CM

## 2023-06-29 DIAGNOSIS — I110 Hypertensive heart disease with heart failure: Secondary | ICD-10-CM | POA: Diagnosis present

## 2023-06-29 DIAGNOSIS — I959 Hypotension, unspecified: Secondary | ICD-10-CM | POA: Diagnosis present

## 2023-06-29 DIAGNOSIS — I5032 Chronic diastolic (congestive) heart failure: Secondary | ICD-10-CM | POA: Diagnosis present

## 2023-06-29 DIAGNOSIS — Z6824 Body mass index (BMI) 24.0-24.9, adult: Secondary | ICD-10-CM

## 2023-06-29 DIAGNOSIS — Z7189 Other specified counseling: Secondary | ICD-10-CM

## 2023-06-29 DIAGNOSIS — C787 Secondary malignant neoplasm of liver and intrahepatic bile duct: Secondary | ICD-10-CM | POA: Diagnosis present

## 2023-06-29 DIAGNOSIS — I10 Essential (primary) hypertension: Secondary | ICD-10-CM | POA: Diagnosis present

## 2023-06-29 DIAGNOSIS — E876 Hypokalemia: Secondary | ICD-10-CM | POA: Diagnosis present

## 2023-06-29 DIAGNOSIS — E854 Organ-limited amyloidosis: Secondary | ICD-10-CM | POA: Diagnosis present

## 2023-06-29 DIAGNOSIS — A419 Sepsis, unspecified organism: Secondary | ICD-10-CM | POA: Diagnosis present

## 2023-06-29 DIAGNOSIS — L89152 Pressure ulcer of sacral region, stage 2: Secondary | ICD-10-CM | POA: Diagnosis present

## 2023-06-29 DIAGNOSIS — Z515 Encounter for palliative care: Secondary | ICD-10-CM

## 2023-06-29 DIAGNOSIS — Z1152 Encounter for screening for COVID-19: Secondary | ICD-10-CM | POA: Diagnosis not present

## 2023-06-29 DIAGNOSIS — I43 Cardiomyopathy in diseases classified elsewhere: Secondary | ICD-10-CM | POA: Diagnosis present

## 2023-06-29 DIAGNOSIS — R4589 Other symptoms and signs involving emotional state: Secondary | ICD-10-CM | POA: Diagnosis not present

## 2023-06-29 DIAGNOSIS — I5030 Unspecified diastolic (congestive) heart failure: Secondary | ICD-10-CM | POA: Diagnosis not present

## 2023-06-29 DIAGNOSIS — J189 Pneumonia, unspecified organism: Secondary | ICD-10-CM | POA: Diagnosis present

## 2023-06-29 DIAGNOSIS — Z66 Do not resuscitate: Secondary | ICD-10-CM | POA: Diagnosis present

## 2023-06-29 DIAGNOSIS — Z79899 Other long term (current) drug therapy: Secondary | ICD-10-CM

## 2023-06-29 DIAGNOSIS — D630 Anemia in neoplastic disease: Secondary | ICD-10-CM | POA: Diagnosis present

## 2023-06-29 DIAGNOSIS — J9601 Acute respiratory failure with hypoxia: Secondary | ICD-10-CM | POA: Diagnosis present

## 2023-06-29 DIAGNOSIS — Z8042 Family history of malignant neoplasm of prostate: Secondary | ICD-10-CM

## 2023-06-29 DIAGNOSIS — K7682 Hepatic encephalopathy: Secondary | ICD-10-CM | POA: Diagnosis present

## 2023-06-29 DIAGNOSIS — D72829 Elevated white blood cell count, unspecified: Secondary | ICD-10-CM | POA: Diagnosis present

## 2023-06-29 LAB — CBC WITH DIFFERENTIAL/PLATELET
Abs Immature Granulocytes: 0.18 10*3/uL — ABNORMAL HIGH (ref 0.00–0.07)
Basophils Absolute: 0.1 10*3/uL (ref 0.0–0.1)
Basophils Relative: 0 %
Eosinophils Absolute: 0 10*3/uL (ref 0.0–0.5)
Eosinophils Relative: 0 %
HCT: 30.3 % — ABNORMAL LOW (ref 36.0–46.0)
Hemoglobin: 9.9 g/dL — ABNORMAL LOW (ref 12.0–15.0)
Immature Granulocytes: 1 %
Lymphocytes Relative: 4 %
Lymphs Abs: 0.7 10*3/uL (ref 0.7–4.0)
MCH: 29.1 pg (ref 26.0–34.0)
MCHC: 32.7 g/dL (ref 30.0–36.0)
MCV: 89.1 fL (ref 80.0–100.0)
Monocytes Absolute: 1.4 10*3/uL — ABNORMAL HIGH (ref 0.1–1.0)
Monocytes Relative: 8 %
Neutro Abs: 16 10*3/uL — ABNORMAL HIGH (ref 1.7–7.7)
Neutrophils Relative %: 87 %
Platelets: 333 10*3/uL (ref 150–400)
RBC: 3.4 MIL/uL — ABNORMAL LOW (ref 3.87–5.11)
RDW: 19 % — ABNORMAL HIGH (ref 11.5–15.5)
WBC: 18.4 10*3/uL — ABNORMAL HIGH (ref 4.0–10.5)
nRBC: 0 % (ref 0.0–0.2)

## 2023-06-29 LAB — COMPREHENSIVE METABOLIC PANEL
ALT: 65 U/L — ABNORMAL HIGH (ref 0–44)
AST: 344 U/L — ABNORMAL HIGH (ref 15–41)
Albumin: 2.4 g/dL — ABNORMAL LOW (ref 3.5–5.0)
Alkaline Phosphatase: 443 U/L — ABNORMAL HIGH (ref 38–126)
Anion gap: 10 (ref 5–15)
BUN: 14 mg/dL (ref 8–23)
CO2: 27 mmol/L (ref 22–32)
Calcium: 9.4 mg/dL (ref 8.9–10.3)
Chloride: 95 mmol/L — ABNORMAL LOW (ref 98–111)
Creatinine, Ser: 0.59 mg/dL (ref 0.44–1.00)
GFR, Estimated: >60 mL/min (ref >60)
Glucose, Bld: 168 mg/dL — ABNORMAL HIGH (ref 70–99)
Potassium: 3.7 mmol/L (ref 3.5–5.1)
Sodium: 132 mmol/L — ABNORMAL LOW (ref 135–145)
Total Bilirubin: 0.7 mg/dL (ref 0.3–1.2)
Total Protein: 5.9 g/dL — ABNORMAL LOW (ref 6.5–8.1)

## 2023-06-29 LAB — LACTIC ACID, PLASMA: Lactic Acid, Venous: 1.4 mmol/L (ref 0.5–1.9)

## 2023-06-29 LAB — RESP PANEL BY RT-PCR (RSV, FLU A&B, COVID)  RVPGX2
Influenza A by PCR: NEGATIVE
Influenza B by PCR: NEGATIVE
Resp Syncytial Virus by PCR: NEGATIVE
SARS Coronavirus 2 by RT PCR: NEGATIVE

## 2023-06-29 LAB — URINALYSIS, ROUTINE W REFLEX MICROSCOPIC
Bilirubin Urine: NEGATIVE
Glucose, UA: NEGATIVE mg/dL
Hgb urine dipstick: NEGATIVE
Ketones, ur: NEGATIVE mg/dL
Leukocytes,Ua: NEGATIVE
Nitrite: NEGATIVE
Protein, ur: NEGATIVE mg/dL
Specific Gravity, Urine: 1.008 (ref 1.005–1.030)
pH: 8 (ref 5.0–8.0)

## 2023-06-29 MED ORDER — SENNOSIDES-DOCUSATE SODIUM 8.6-50 MG PO TABS: 1.0000 | ORAL_TABLET | Freq: Every evening | ORAL | Status: DC | PRN

## 2023-06-29 MED ORDER — PIPERACILLIN-TAZOBACTAM 3.375 G IVPB
3.3750 g | Freq: Three times a day (TID) | INTRAVENOUS | Status: DC
  Administered 2023-06-30 – 2023-07-01 (×4): 3.375 g via INTRAVENOUS
  Filled 2023-06-29 (×4): qty 50

## 2023-06-29 MED ORDER — ONDANSETRON HCL 4 MG/2ML IJ SOLN: 4.0000 mg | Freq: Four times a day (QID) | INTRAMUSCULAR | Status: DC | PRN

## 2023-06-29 MED ORDER — SODIUM CHLORIDE 0.9 % IV BOLUS
1000.0000 mL | Freq: Once | INTRAVENOUS | Status: AC
  Administered 2023-06-29: 1000 mL via INTRAVENOUS

## 2023-06-29 MED ORDER — VANCOMYCIN HCL 1250 MG/250ML IV SOLN
1250.0000 mg | INTRAVENOUS | Status: DC
  Administered 2023-07-01: 1250 mg via INTRAVENOUS
  Filled 2023-06-29: qty 250

## 2023-06-29 MED ORDER — ONDANSETRON HCL 4 MG PO TABS: 4.0000 mg | ORAL_TABLET | Freq: Four times a day (QID) | ORAL | Status: DC | PRN

## 2023-06-29 MED ORDER — PIPERACILLIN-TAZOBACTAM 3.375 G IVPB 30 MIN
3.3750 g | Freq: Once | INTRAVENOUS | Status: AC
  Administered 2023-06-29: 3.375 g via INTRAVENOUS
  Filled 2023-06-29: qty 50

## 2023-06-29 MED ORDER — ACETAMINOPHEN 325 MG PO TABS
650.0000 mg | ORAL_TABLET | Freq: Once | ORAL | Status: AC
  Administered 2023-06-29: 650 mg via ORAL
  Filled 2023-06-29: qty 2

## 2023-06-29 MED ORDER — ACETAMINOPHEN 650 MG RE SUPP: 650.0000 mg | Freq: Four times a day (QID) | RECTAL | Status: DC | PRN

## 2023-06-29 MED ORDER — ACETAMINOPHEN 325 MG PO TABS
650.0000 mg | ORAL_TABLET | Freq: Four times a day (QID) | ORAL | Status: DC | PRN
  Administered 2023-07-01 – 2023-07-03 (×4): 650 mg via ORAL
  Filled 2023-06-29 (×4): qty 2

## 2023-06-29 MED ORDER — VANCOMYCIN HCL IN DEXTROSE 1-5 GM/200ML-% IV SOLN
1000.0000 mg | Freq: Once | INTRAVENOUS | Status: AC
  Administered 2023-06-29: 1000 mg via INTRAVENOUS
  Filled 2023-06-29: qty 200

## 2023-06-29 MED ORDER — ENOXAPARIN SODIUM 40 MG/0.4ML IJ SOSY
40.0000 mg | PREFILLED_SYRINGE | INTRAMUSCULAR | Status: DC
  Administered 2023-06-30 – 2023-07-03 (×4): 40 mg via SUBCUTANEOUS
  Filled 2023-06-29 (×4): qty 0.4

## 2023-06-29 NOTE — ED Provider Notes (Signed)
[facility] AT [facility] Provider Note   CSN: [id number] Arrival date & time: 06/29/23  1948     History  Chief Complaint  Patient presents with   Weakness    Amanda Green is a 88 y.o. female history of dementia, recurrent UTI with indwelling Foley, recent admission for pneumonia here presenting with weakness and altered mental status.  Patient recently was admitted for pneumonia.  She finished a course of antibiotics in the hospital.  Patient went home and has been home with her daughter.  Patient was noted to be very confused today.  Patient also has some coughing.  Patient is not currently on antibiotics.  History given by daughter  The history is provided by a relative.       Home Medications Prior to Admission medications   Medication Sig Start Date End Date Taking? Authorizing Provider  acetaminophen (TYLENOL) 650 MG CR tablet Take 650-1,300 mg by mouth 2 (two) times daily as needed for pain.    [provider]  albuterol (PROVENTIL) (2.5 MG/3ML) 0.083% nebulizer solution Take 2.5 mg by nebulization 2 (two) times daily as needed for wheezing or shortness of breath.    [provider]  amiodarone (PACERONE) 100 MG tablet Take 1 tablet (100 mg total) by mouth daily. 06/19/23   Cannon Kettle, PA-C  ASCORBIC ACID PO Take 1 tablet by mouth daily. Vitamin C, unknown strength.    [provider]  brimonidine (ALPHAGAN) 0.2 % ophthalmic solution Place 1 drop into both eyes daily.    [provider]  Cyanocobalamin (VITAMIN B-12 PO) Take 1 tablet by mouth daily.    [provider]  diclofenac Sodium (VOLTAREN) 1 % GEL Apply 1 Application topically 4 (four) times daily as needed (joint pain).    [provider]  dorzolamide (TRUSOPT) 2 % ophthalmic solution Place 1 drop into both eyes 2 (two) times daily. 10/19/20   [provider]  furosemide (LASIX) 20 MG tablet Take 1 tablet (20 mg  total) by mouth daily. 03/26/23 08/24/23  Arrien, York Ram, MD  hydrocerin (EUCERIN) CREA Apply 1 Application topically daily. Apply to left leg and heel once daily, do not apply between digits 06/23/23   Atway, Rayann N, DO  hydrochlorothiazide (HYDRODIURIL) 12.5 MG tablet Take 12.5 mg by mouth daily.    [provider]  leptospermum manuka honey (MEDIHONEY) PSTE paste Apply 1 Application topically daily. Apply to right heel Unstageable PI and sacral stage 2 PI after cleansing Apply thin layer (3 mm) to wound. 06/23/23 07/23/23  Atway, Rayann N, DO  losartan (COZAAR) 25 MG tablet Take 1 tablet (25 mg total) by mouth daily. 03/26/23 08/24/23  Arrien, York Ram, MD  MAGNESIUM OXIDE PO Take 1 tablet by mouth daily.    [provider]  memantine (NAMENDA) 10 MG tablet Take 1 tablet (10 mg total) by mouth 2 (two) times daily. 02/04/23   Ihor Austin, NP  metoprolol tartrate (LOPRESSOR) 50 MG tablet Take 1 tablet (50 mg total) by mouth 2 (two) times daily. 06/19/23   Cannon Kettle, PA-C  Multiple Vitamins-Minerals (MULTIVITAMIN WOMEN 50+) TABS Take 1 tablet by mouth daily.    [provider]  spironolactone (ALDACTONE) 25 MG tablet Take 1 tablet (25 mg total) by mouth daily. 06/19/23 09/17/23  Cannon Kettle, PA-C  Tafamidis (VYNDAMAX) 61 MG CAPS Take 61 mg by mouth daily. 04/27/22   Croitoru, Mihai, MD  TURMERIC CURCUMIN PO Take 1 tablet  by mouth daily.    [provider]      Allergies    Neurontin [gabapentin]    Review of Systems   Review of Systems  Constitutional:  Positive for fever.  Neurological:  Positive for weakness.  All other systems reviewed and are negative.   Physical Exam Updated Vital Signs BP 135/68   Pulse 80   Temp (!) 101.7 F (38.7 C) (Rectal)   Resp (!) 36   Wt 61.9 kg   SpO2 91%   BMI 24.16 kg/m  Physical Exam Vitals and nursing note reviewed.  Constitutional:      Comments: Chronically ill-appearing  HENT:      Head: Normocephalic.     Nose: Nose normal.     Mouth/Throat:     Mouth: Mucous membranes are dry.  Eyes:     Extraocular Movements: Extraocular movements intact.     Pupils: Pupils are equal, round, and reactive to light.  Cardiovascular:     Rate and Rhythm: Normal rate and regular rhythm.     Pulses: Normal pulses.     Heart sounds: Normal heart sounds.  Pulmonary:     Comments: Crackles on the left base Abdominal:     General: Abdomen is flat.  Musculoskeletal:        General: Normal range of motion.     Cervical back: Normal range of motion and neck supple.  Skin:    General: Skin is warm.     Capillary Refill: Capillary refill takes less than 2 seconds.  Neurological:     Comments: Chronically ill and confused and moving all extremities  Psychiatric:        Mood and Affect: Mood normal.     ED Results / Procedures / Treatments   Labs (all labs ordered are listed, but only abnormal results are displayed) Labs Reviewed  CBC WITH DIFFERENTIAL/PLATELET - Abnormal; Notable for the following components:      Result Value   WBC 18.4 (*)    RBC 3.40 (*)    Hemoglobin 9.9 (*)    HCT 30.3 (*)    RDW 19.0 (*)    Neutro Abs 16.0 (*)    Monocytes Absolute 1.4 (*)    Abs Immature Granulocytes 0.18 (*)    All other components within normal limits  URINE CULTURE  RESP PANEL BY RT-PCR (RSV, FLU A&B, COVID)  [id number]  CULTURE, BLOOD (ROUTINE X 2)  CULTURE, BLOOD (ROUTINE X 2)  COMPREHENSIVE METABOLIC PANEL  URINALYSIS, ROUTINE W REFLEX MICROSCOPIC  LACTIC ACID, PLASMA  LACTIC ACID, PLASMA    EKG None  Radiology DG Chest Port 1 View  Result Date: 06/29/2023 CLINICAL DATA:  Fever, cough, hypoxia, weakness, fatigue. Metastatic breast cancer. EXAM: PORTABLE CHEST 1 VIEW COMPARISON:  06/21/2023 FINDINGS: Retrocardiac airspace opacity is mildly increased and likely represents combination of known pleural effusion and atelectasis although a component of pneumonia cannot be  excluded. Atherosclerotic calcification of the aortic arch. Right axillary lymph node clips. Bony demineralization. The right lung appears clear. IMPRESSION: 1. Retrocardiac airspace opacity is mildly increased and likely represents combination of known pleural effusion and atelectasis although a component of pneumonia cannot be excluded. 2.  Aortic Atherosclerosis (ICD10-I70.0). Electronically Signed   By: Gaylyn Rong M.D.   On: 06/29/2023 20:39    Procedures Procedures    CRITICAL CARE Performed by: Richardean Canal   Total critical care time: 39 minutes  Critical care time was exclusive of separately billable procedures and treating  other patients.  Critical care was necessary to treat or prevent imminent or life-threatening deterioration.  Critical care was time spent personally by me on the following activities: development of treatment plan with patient and/or surrogate as well as nursing, discussions with consultants, evaluation of patient's response to treatment, examination of patient, obtaining history from patient or surrogate, ordering and performing treatments and interventions, ordering and review of laboratory studies, ordering and review of radiographic studies, pulse oximetry and re-evaluation of patient's condition.   Medications Ordered in ED Medications  vancomycin (VANCOCIN) IVPB 1000 mg/200 mL premix (has no administration in time range)  piperacillin-tazobactam (ZOSYN) IVPB 3.375 g (has no administration in time range)  acetaminophen (TYLENOL) tablet 650 mg (650 mg Oral Given 06/29/23 2037)    ED Course/ Medical Decision Making/ A&P                             Medical Decision Making Amanda Green is a 88 y.o. female here presenting with cough and fever.  Patient is febrile 101.7.  Patient has borderline oxygen saturation of 90 to 91%.  She was recently admitted for pneumonia.  Concern for possible recurrent pneumonia versus UTI.  Plan to get CBC and CMP  and UA and chest x-ray.  Patient will need IV antibiotics and admission.   10:12 PM I reviewed patient's labs and her white count is 18.  Patient's lactate is normal.  Patient's chest x-ray showed worsening pneumonia with pleural effusion.  Patient received Vanco and Zosyn for hospital-acquired pneumonia.  Patient briefly became hypotensive to the 90s.  After IV fluids her blood pressure came up to 100/47.  Hospitalist will admit for pneumonia.   Problems Addressed: HCAP (healthcare-associated pneumonia): acute illness or injury Sepsis, due to unspecified organism, unspecified whether acute organ dysfunction present [facility]): acute illness or injury  Amount and/or Complexity of Data Reviewed Labs: ordered. Decision-making details documented in ED Course. Radiology: ordered and independent interpretation performed. Decision-making details documented in ED Course.  Risk OTC drugs. Prescription drug management. Decision regarding hospitalization.    Final Clinical Impression(s) / ED Diagnoses Final diagnoses:  None    Rx / DC Orders ED Discharge Orders     None         Charlynne Pander, MD 06/29/23 2221

## 2023-06-29 NOTE — H&P (Incomplete)
PCP:   Ollen Bowl, MD   Chief Complaint:  Confusion, shortness of breath  HPI: This is a 88 year old female with past medical history of HTN, dementia, HFpEF, paroxysmal atrial fibrillation, cardiac amyloidosis, breast cancer with brain and liver mets, history of CVA, and chronic indwelling Foley catheter since last admission.  Patient recently admitted 7/5- 7/7 with encephalopathy and declining functional status.  Imaging done at that time revealed improvement in large posterior mass with left vasogenic anemia after radiation therapy.  She did have scattered new small lesions and newly diagnosed liver lesions.  Acute metabolic encephalopathy thought to be due to new subcentimeter brain mets and/or UTI.  Patient is UTI treated with 3 days of IV antibiotics.  Patient discharged home with daughter.  After discharge patient did well Sunday, Monday, and Tuesday.  After this her started noting some decline.  The daughter thought the patient was to have been discharged with antibiotics and was not. The patient developed cough and congestion.  She became weak and confused.  Her daughter treated her with nebulizers without improvement.  There was no shortness of breath complaints, no fever, chills, nausea, vomiting or diarrhea.  911 was called.  In the ER presenting BP 101.7, RR 36. Other vitals stable. WBC 18.4, ALP 443, previous 427, AST 344, previous 110, ALT 65 with previous normal, albumin 2.9, previous 2.6.  Previous labs done 06/25/2023.  Chest x-ray shows retrocardiac airspace opacity mild increased likely represents combination of known pleural effusion and atelectasis, some component of pneumonia cannot be excluded.  Blood cultures x 2 collected, treatment started with IV Rocephin and azithromycin.  Patient hypoxic placed on 2 L nasal cannula.  History was provided by daughter who is present at bedside  Review of Systems:  Per HPI  Past Medical History: Past Medical History:  Diagnosis Date    Arthritis    knees,   Breast cancer (HCC) 1999   right mastectomy   Breast cancer, left (HCC) 05/2019   left lumpectomy   Cerebrovascular accident (CVA) (HCC) 02/07/2021   Diabetes mellitus    Dislocation of left shoulder joint    Family history of prostate cancer    Hyperlipidemia    Hypertension    Memory loss    Numbness and tingling of right lower extremity    Paresthesia    Stroke (HCC)    light stroke - 2012 ,right leg nerve damage    Past Surgical History:  Procedure Laterality Date   ABDOMINAL HYSTERECTOMY     APPENDECTOMY     BREAST BIOPSY Left 01/19/2010   BREAST LUMPECTOMY WITH RADIOACTIVE SEED LOCALIZATION Left 07/17/2019   Procedure: LEFT BREAST LUMPECTOMY WITH RADIOACTIVE SEED LOCALIZATION;  Surgeon: Luretha Murphy, MD;  Location: [facility];  Service: General;  Laterality: Left;   BREAST SURGERY     Right mastectomy 1999   CARPAL TUNNEL RELEASE Right 09/30/2014   Procedure: RIGHT CARPAL TUNNEL RELEASE;  Surgeon: Cindee Salt, MD;  Location: [facility];  Service: Orthopedics;  Laterality: Right;   COLONOSCOPY     EYE SURGERY     cataracts   JOINT REPLACEMENT     lt total knee 2011   LIPOMA EXCISION  11/28/2011   Procedure: EXCISION LIPOMA;  Surgeon: Valarie Merino, MD;  Location: [facility];  Service: General;  Laterality: N/A;  excisino lipoma 4 cm back of neck   MASTECTOMY Right 1999   TOTAL MASTECTOMY Left 05/10/2021   Procedure: LEFT TOTAL MASTECTOMY;  Surgeon: Luretha Murphy, MD;  Location: [facility];  Service: General;  Laterality: Left;    Medications: Prior to Admission medications   Medication Sig Start Date End Date Taking? Authorizing Provider  acetaminophen (TYLENOL) 650 MG CR tablet Take 650-1,300 mg by mouth 2 (two) times daily as needed for pain.    [provider]  albuterol (PROVENTIL) (2.5 MG/3ML) 0.083% nebulizer solution Take 2.5 mg by nebulization 2 (two) times daily as needed for  wheezing or shortness of breath.    [provider]  amiodarone (PACERONE) 100 MG tablet Take 1 tablet (100 mg total) by mouth daily. 06/19/23   Cannon Kettle, PA-C  ASCORBIC ACID PO Take 1 tablet by mouth daily. Vitamin C, unknown strength.    [provider]  brimonidine (ALPHAGAN) 0.2 % ophthalmic solution Place 1 drop into both eyes daily.    [provider]  Cyanocobalamin (VITAMIN B-12 PO) Take 1 tablet by mouth daily.    [provider]  diclofenac Sodium (VOLTAREN) 1 % GEL Apply 1 Application topically 4 (four) times daily as needed (joint pain).    [provider]  dorzolamide (TRUSOPT) 2 % ophthalmic solution Place 1 drop into both eyes 2 (two) times daily. 10/19/20   [provider]  furosemide (LASIX) 20 MG tablet Take 1 tablet (20 mg total) by mouth daily. 03/26/23 08/24/23  Arrien, York Ram, MD  hydrocerin (EUCERIN) CREA Apply 1 Application topically daily. Apply to left leg and heel once daily, do not apply between digits 06/23/23   Atway, Rayann N, DO  hydrochlorothiazide (HYDRODIURIL) 12.5 MG tablet Take 12.5 mg by mouth daily.    [provider]  leptospermum manuka honey (MEDIHONEY) PSTE paste Apply 1 Application topically daily. Apply to right heel Unstageable PI and sacral stage 2 PI after cleansing Apply thin layer (3 mm) to wound. 06/23/23 07/23/23  Atway, Rayann N, DO  losartan (COZAAR) 25 MG tablet Take 1 tablet (25 mg total) by mouth daily. 03/26/23 08/24/23  Arrien, York Ram, MD  MAGNESIUM OXIDE PO Take 1 tablet by mouth daily.    [provider]  memantine (NAMENDA) 10 MG tablet Take 1 tablet (10 mg total) by mouth 2 (two) times daily. 02/04/23   Ihor Austin, NP  metoprolol tartrate (LOPRESSOR) 50 MG tablet Take 1 tablet (50 mg total) by mouth 2 (two) times daily. 06/19/23   Cannon Kettle, PA-C  Multiple Vitamins-Minerals (MULTIVITAMIN WOMEN 50+) TABS Take 1 tablet by mouth daily.    [provider]  spironolactone (ALDACTONE) 25 MG tablet Take 1 tablet (25 mg total) by mouth daily. 06/19/23 09/17/23  Cannon Kettle, PA-C  Tafamidis (VYNDAMAX) 61 MG CAPS Take 61 mg by mouth daily. 04/27/22   Croitoru, Mihai, MD  TURMERIC CURCUMIN PO Take 1 tablet by mouth daily.    [provider]    Allergies:   Allergies  Allergen Reactions   Neurontin [Gabapentin] Other (See Comments)    Drowsiness    Social History:  reports that she has never smoked. She has never used smokeless tobacco. She reports that she does not drink alcohol and does not use drugs.  Family History: Family History  Problem Relation Age of Onset   Esophageal cancer Mother        smoker   Other Father        Unsure of medical history.   Prostate cancer Brother        d. 23s   Cancer Son        ??  unsure, but may have had cancer   Cancer Son        ?? unsure, but may have had cancer    Physical Exam: Vitals:   06/29/23 2135 06/29/23 2200 06/29/23 2230 06/29/23 2245  BP: (!) 94/52 (!) 100/47 (!) 107/48 (!) 111/47  Pulse: 69 68 67 66  Resp: (!) 27 (!) 22 19 18   Temp:   (!) 97.4 F (36.3 C)   TempSrc:   Oral   SpO2: 100% 100% 100% 100%  Weight:        General:  A&O x0, in no acute distress.  Sleeping comfortably but arousable Eyes: Pink conjunctiva, no scleral icterus ENT: Moist oral mucosa, neck supple, no thyromegaly Lungs: CTA B/L, no use of accessory muscles Cardiovascular: RRR, No carotid bruits, no JVD Abdomen: soft, positive BS, NTND, no organomegaly, not an acute abdomen GU: not examined Neuro: CN II - XII grossly intact Musculoskeletal: strength 5/5 all extremities, no clubbing, cyanosis or edema Skin: Right heel decubitus Psych: Confused/demented patient   Labs on Admission:  Recent Labs    06/29/23 2135  NA 132*  K 3.7  CL 95*  CO2 27  GLUCOSE 168*  BUN 14  CREATININE 0.59  CALCIUM 9.4   Recent Labs    06/29/23 2135  AST 344*  ALT 65*  ALKPHOS  443*  BILITOT 0.7  PROT 5.9*  ALBUMIN 2.4*    Recent Labs    06/29/23 2000  WBC 18.4*  NEUTROABS 16.0*  HGB 9.9*  HCT 30.3*  MCV 89.1  PLT 333    Micro Results: Recent Results (from the past 240 hour(s))  SARS Coronavirus 2 by RT PCR (hospital order, performed in [facility] hospital lab) *cepheid single result test* Anterior Nasal Swab     Status: None   Collection Time: 06/21/23 12:40 PM   Specimen: Anterior Nasal Swab  Result Value Ref Range Status   SARS Coronavirus 2 by RT PCR NEGATIVE NEGATIVE Final    Comment: Performed at [facility], [address] [place], [place] [address]  Blood culture (routine x 2)     Status: Abnormal   Collection Time: 06/21/23  1:15 PM   Specimen: BLOOD RIGHT ARM  Result Value Ref Range Status   Specimen Description BLOOD RIGHT ARM  Final   Special Requests   Final    BOTTLES DRAWN AEROBIC AND ANAEROBIC Blood Culture adequate volume   Culture  Setup Time   Final    GRAM POSITIVE COCCI AEROBIC BOTTLE ONLY CRITICAL RESULT CALLED TO, READ BACK BY AND VERIFIED WITH: PHARMD HALEY VON DOHLEN [id number] 1039 BY J RAZZAK MT    Culture (A)  Final    STAPHYLOCOCCUS CAPITIS STAPHYLOCOCCUS HOMINIS STAPHYLOCOCCUS EPIDERMIDIS BY BCID UNABLE TO ISOLATE FROM CULTURE THE SIGNIFICANCE OF ISOLATING THIS ORGANISM FROM A SINGLE SET OF BLOOD CULTURES WHEN MULTIPLE SETS ARE DRAWN IS UNCERTAIN. PLEASE NOTIFY THE MICROBIOLOGY DEPARTMENT WITHIN ONE WEEK IF SPECIATION AND SENSITIVITIES ARE REQUIRED. Performed at [facility], [address], [place], [place] [address]    Report Status 06/24/2023 FINAL  Final  Blood culture (routine x 2)     Status: None   Collection Time: 06/21/23  1:15 PM   Specimen: BLOOD LEFT ARM  Result Value Ref Range Status   Specimen Description BLOOD LEFT ARM  Final   Special Requests   Final    BOTTLES DRAWN AEROBIC AND ANAEROBIC Blood Culture adequate volume   Culture   Final  NO GROWTH 5 DAYS Performed at [facility], [address], [place], [place] [address]    Report Status 06/26/2023 FINAL  Final  Blood Culture ID Panel (Reflexed)     Status: Abnormal   Collection Time: 06/21/23  1:15 PM  Result Value Ref Range Status   Enterococcus faecalis NOT DETECTED NOT DETECTED Final   Enterococcus Faecium NOT DETECTED NOT DETECTED Final   Listeria monocytogenes NOT DETECTED NOT DETECTED Final   Staphylococcus species DETECTED (A) NOT DETECTED Final    Comment: CRITICAL RESULT CALLED TO, READ BACK BY AND VERIFIED WITH: PHARMD HALEY VON DOHLEN [id number] 1039 BY J RAZZAK MT    Staphylococcus aureus (BCID) NOT DETECTED NOT DETECTED Final   Staphylococcus epidermidis DETECTED (A) NOT DETECTED Final    Comment: Methicillin (oxacillin) resistant coagulase negative staphylococcus. Possible blood culture contaminant (unless isolated from more than one blood culture draw or clinical case suggests pathogenicity). No antibiotic treatment is indicated for blood  culture contaminants. CRITICAL RESULT CALLED TO, READ BACK BY AND VERIFIED WITH: PHARMD HALEY VON DOHLEN [id number] 1039 BY J RAZZAK MT    Staphylococcus lugdunensis NOT DETECTED NOT DETECTED Final   Streptococcus species NOT DETECTED NOT DETECTED Final   Streptococcus agalactiae NOT DETECTED NOT DETECTED Final   Streptococcus pneumoniae NOT DETECTED NOT DETECTED Final   Streptococcus pyogenes NOT DETECTED NOT DETECTED Final   A.calcoaceticus-baumannii NOT DETECTED NOT DETECTED Final   Bacteroides fragilis NOT DETECTED NOT DETECTED Final   Enterobacterales NOT DETECTED NOT DETECTED Final   Enterobacter cloacae complex NOT DETECTED NOT DETECTED Final   Escherichia coli NOT DETECTED NOT DETECTED Final   Klebsiella aerogenes NOT DETECTED NOT DETECTED Final   Klebsiella oxytoca NOT DETECTED NOT DETECTED Final   Klebsiella pneumoniae NOT DETECTED NOT DETECTED Final   Proteus species NOT DETECTED NOT DETECTED Final   Salmonella species NOT DETECTED NOT  DETECTED Final   Serratia marcescens NOT DETECTED NOT DETECTED Final   Haemophilus influenzae NOT DETECTED NOT DETECTED Final   Neisseria meningitidis NOT DETECTED NOT DETECTED Final   Pseudomonas aeruginosa NOT DETECTED NOT DETECTED Final   Stenotrophomonas maltophilia NOT DETECTED NOT DETECTED Final   Candida albicans NOT DETECTED NOT DETECTED Final   Candida auris NOT DETECTED NOT DETECTED Final   Candida glabrata NOT DETECTED NOT DETECTED Final   Candida krusei NOT DETECTED NOT DETECTED Final   Candida parapsilosis NOT DETECTED NOT DETECTED Final   Candida tropicalis NOT DETECTED NOT DETECTED Final   Cryptococcus neoformans/gattii NOT DETECTED NOT DETECTED Final   Methicillin resistance mecA/C DETECTED (A) NOT DETECTED Final    Comment: CRITICAL RESULT CALLED TO, READ BACK BY AND VERIFIED WITH: PHARMD HALEY VON DOHLEN [phone] 1039 BY Berline Chough MT Performed at [facility], [address] [place], [place] [address]   Resp panel by RT-PCR (RSV, Flu A&B, Covid) Anterior Nasal Swab     Status: None   Collection Time: 06/29/23  8:17 PM   Specimen: Anterior Nasal Swab  Result Value Ref Range Status   SARS Coronavirus 2 by RT PCR NEGATIVE NEGATIVE Final    Comment: (NOTE) SARS-CoV-2 target nucleic acids are NOT DETECTED.  The SARS-CoV-2 RNA is generally detectable in upper respiratory specimens during the acute phase of infection. The lowest concentration of SARS-CoV-2 viral copies this assay can detect is 138 copies/mL. A negative result does not preclude SARS-Cov-2 infection and should not be used as the sole basis for treatment or other patient management decisions. A  negative result may occur with  improper specimen collection/handling, submission of specimen other than nasopharyngeal swab, presence of viral mutation(s) within the areas targeted by this assay, and inadequate number of viral copies(<138 copies/mL). A negative result must be combined with clinical  observations, patient history, and epidemiological information. The expected result is Negative.  Fact Sheet for Patients:  BloggerCourse.com  Fact Sheet for Healthcare Providers:  SeriousBroker.it  This test is no t yet approved or cleared by the [place] FDA and  has been authorized for detection and/or diagnosis of SARS-CoV-2 by FDA under an Emergency Use Authorization (EUA). This EUA will remain  in effect (meaning this test can be used) for the duration of the COVID-19 declaration under Section 564(b)(1) of the Act, 21 U.S.C.section 360bbb-3(b)(1), unless the authorization is terminated  or revoked sooner.       Influenza A by PCR NEGATIVE NEGATIVE Final   Influenza B by PCR NEGATIVE NEGATIVE Final    Comment: (NOTE) The Xpert Xpress SARS-CoV-2/FLU/RSV plus assay is intended as an aid in the diagnosis of influenza from Nasopharyngeal swab specimens and should not be used as a sole basis for treatment. Nasal washings and aspirates are unacceptable for Xpert Xpress SARS-CoV-2/FLU/RSV testing.  Fact Sheet for Patients: BloggerCourse.com  Fact Sheet for Healthcare Providers: SeriousBroker.it  This test is not yet approved or cleared by the [place] FDA and has been authorized for detection and/or diagnosis of SARS-CoV-2 by FDA under an Emergency Use Authorization (EUA). This EUA will remain in effect (meaning this test can be used) for the duration of the COVID-19 declaration under Section 564(b)(1) of the Act, 21 U.S.C. section 360bbb-3(b)(1), unless the authorization is terminated or revoked.     Resp Syncytial Virus by PCR NEGATIVE NEGATIVE Final    Comment: (NOTE) Fact Sheet for Patients: BloggerCourse.com  Fact Sheet for Healthcare Providers: SeriousBroker.it  This test is not yet approved or cleared by  the [place] FDA and has been authorized for detection and/or diagnosis of SARS-CoV-2 by FDA under an Emergency Use Authorization (EUA). This EUA will remain in effect (meaning this test can be used) for the duration of the COVID-19 declaration under Section 564(b)(1) of the Act, 21 U.S.C. section 360bbb-3(b)(1), unless the authorization is terminated or revoked.  Performed at [facility], [address], [place], [place] [address]      Radiological Exams on Admission: DG Chest Port 1 View  Result Date: 06/29/2023 CLINICAL DATA:  Fever, cough, hypoxia, weakness, fatigue. Metastatic breast cancer. EXAM: PORTABLE CHEST 1 VIEW COMPARISON:  06/21/2023 FINDINGS: Retrocardiac airspace opacity is mildly increased and likely represents combination of known pleural effusion and atelectasis although a component of pneumonia cannot be excluded. Atherosclerotic calcification of the aortic arch. Right axillary lymph node clips. Bony demineralization. The right lung appears clear. IMPRESSION: 1. Retrocardiac airspace opacity is mildly increased and likely represents combination of known pleural effusion and atelectasis although a component of pneumonia cannot be excluded. 2.  Aortic Atherosclerosis (ICD10-I70.0). Electronically Signed   By: Gaylyn Rong M.D.   On: 06/29/2023 20:39    Assessment/Plan Present on Admission:  Pneumonia due to gram-negative organism  Acute respiratory failure  //   Sepsis due to pneumonia -Pneumonia order set initiated -Blood cultures x 2 ordered -IV Zosyn ordered -Continue oxygen, keep sats greater than 88% -Nebulizers as needed -CBC w/ differential in a.m. -Procalcitonin level ordered -Lactic acid normal   Elevated LFTs //  Hepatic metastasis, new -New hepatic metastasis noted on  recent CT scan done 7/5, likely cause.  Patient's daughter expressed concern that she was not aware of liver metastasis -CMP, ammonia level and INR in  a.m.   Acute metabolic encephalopathy in patient with known dementia -Treating underlying cause pneumonia  -Patient also with known brain mets and new subcentimeter brain mets -Continue Namenda -Urinalysis done shows evidence of infection   Left-sided pleural effusion -PO Lasix 20 mg daily   Right heel decub -Wound care consult   Recurrent indwelling Foley catheter -Unclear reason for indwelling urinary cath.  Daughter unable to clarify.  Defer to a.m. team for voiding trial   Paroxysmal atrial fibrillation -Amiodarone on hold with elevated LFTs. -Metoprolol continue to hold parameters -No anticoagulation given history of metastatic disease to her brain and risk of hemorrhage.     Essential hypertension -HCTZ, losartan, spironolactone on hold, BP borderline -Continue metoprolol   HFpEF -Resume lasix and metoprolol held -spironolactone, losartan held   Cardiac amyloidosis -Continue tafamidis.   Breast cancer metastasized to brain and liver mets -Per oncology   History of CVA  Rahkeem Senft 06/29/2023, 10:48 PM

## 2023-06-29 NOTE — ED Notes (Signed)
ED TO INPATIENT HANDOFF REPORT  Name/Age/Gender Amanda Green 88 y.o. female  Code Status    Code Status Orders  (From admission, onward)           Start     Ordered   06/29/23 2240  Do not attempt resuscitation (DNR)  Continuous       Question Answer Comment  If patient has no pulse and is not breathing Do Not Attempt Resuscitation   If patient has a pulse and/or is breathing: Medical Treatment Goals COMFORT MEASURES: Keep clean/warm/dry, use medication by any route; positioning, wound care and other measures to relieve pain/suffering; use oxygen, suction/manual treatment of airway obstruction for comfort; do not transfer unless for comfort needs.   Consent: Discussion documented in EHR or advanced directives reviewed      06/29/23 2239           Code Status History     Date Active Date Inactive Code Status Order ID Comments User Context   06/29/2023 2239 06/29/2023 2239 Full Code [id number]  Gery Pray, MD ED   06/21/2023 1806 06/23/2023 1839 DNR [id number]  Chauncey Mann, DO ED   03/30/2023 1226 04/02/2023 1541 Full Code [id number]  Synetta Fail, MD ED   03/09/2023 1723 03/26/2023 0313 Full Code [id number]  Orland Mustard, MD ED   02/14/2023 0356 02/18/2023 2320 Full Code [id number]  Hillary Bow, DO ED   02/14/2023 0147 02/14/2023 0356 Full Code [id number]  Hillary Bow, DO ED   05/10/2021 1718 05/11/2021 1730 Full Code [id number]  Luretha Murphy, MD Inpatient   04/14/2021 1437 04/15/2021 2040 Full Code [id number]  Clydie Braun, MD ED   08/15/2016 1525 08/17/2016 1409 Full Code [id number]  Luretha Murphy, MD Inpatient       Home/SNF/Other Home- pt lives at home with daughter  Chief Complaint HCAP (healthcare-associated pneumonia) [J18.9]  Level of Care/Admitting Diagnosis ED Disposition     ED Disposition  Admit   Condition  --   Comment  Hospital Area: [facility] [[id number]]  Level of Care: Med-Surg [16]  May admit patient to [facility] or [facility] if equivalent level of care is available:: Yes  Covid Evaluation: Confirmed COVID Negative  Diagnosis: HCAP (healthcare-associated pneumonia) [301601]  Admitting Physician: Gery Pray [[id number]]  Attending Physician: Gery Pray [[id number]]  Certification:: I certify this patient will need inpatient services for at least 2 midnights  Estimated Length of Stay: 2          Medical History Past Medical History:  Diagnosis Date   Arthritis    knees,   Breast cancer (HCC) 1999   right mastectomy   Breast cancer, left (HCC) 05/2019   left lumpectomy   Cerebrovascular accident (CVA) (HCC) 02/07/2021   Diabetes mellitus    Dislocation of left shoulder joint    Family history of prostate cancer    Hyperlipidemia    Hypertension    Memory loss    Numbness and tingling of right lower extremity    Paresthesia    Stroke (HCC)    light stroke - 2012 ,right leg nerve damage     Allergies Allergies  Allergen Reactions   Neurontin [Gabapentin] Other (See Comments)    Drowsiness    IV Location/Drains/Wounds Patient Lines/Drains/Airways Status     Active Line/Drains/Airways     Name Placement date Placement time Site Days   Peripheral IV 06/29/23 22 G Left Hand 06/29/23  2001  Hand  less than 1  Peripheral IV 06/29/23 20 G Right Forearm 06/29/23  2130  Forearm  less than 1   Urethral Catheter Sherol Dade, RN Straight-tip 16 Fr. 06/21/23  1516  Straight-tip  8   Pressure Injury 03/09/23 Buttocks Right Stage 2 -  Partial thickness loss of dermis presenting as a shallow open injury with a red, pink wound bed without slough. less than 1cm size. pink/red 03/09/23  1854  -- 112   Pressure Injury 06/22/23 Heel Right Stage 3 -  Full thickness tissue loss. Subcutaneous fat may be visible but bone, tendon or muscle are NOT exposed. 06/22/23  0925  -- 7            Labs/Imaging Results for orders placed or performed during the hospital encounter of 06/29/23 (from the  past 48 hour(s))  CBC with Differential     Status: Abnormal   Collection Time: 06/29/23  8:00 PM  Result Value Ref Range   WBC 18.4 (H) 4.0 - 10.5 K/uL   RBC 3.40 (L) 3.87 - 5.11 MIL/uL   Hemoglobin 9.9 (L) 12.0 - 15.0 g/dL   HCT 16.1 (L) 09.6 - 04.5 %   MCV 89.1 80.0 - 100.0 fL   MCH 29.1 26.0 - 34.0 pg   MCHC 32.7 30.0 - 36.0 g/dL   RDW 40.9 (H) 81.1 - 91.4 %   Platelets 333 150 - 400 K/uL   nRBC 0.0 0.0 - 0.2 %   Neutrophils Relative % 87 %   Neutro Abs 16.0 (H) 1.7 - 7.7 K/uL   Lymphocytes Relative 4 %   Lymphs Abs 0.7 0.7 - 4.0 K/uL   Monocytes Relative 8 %   Monocytes Absolute 1.4 (H) 0.1 - 1.0 K/uL   Eosinophils Relative 0 %   Eosinophils Absolute 0.0 0.0 - 0.5 K/uL   Basophils Relative 0 %   Basophils Absolute 0.1 0.0 - 0.1 K/uL   Immature Granulocytes 1 %   Abs Immature Granulocytes 0.18 (H) 0.00 - 0.07 K/uL    Comment: Performed at [facility], [address], [place], [place] [address]  Lactic acid, plasma     Status: None   Collection Time: 06/29/23  8:00 PM  Result Value Ref Range   Lactic Acid, Venous 1.4 0.5 - 1.9 mmol/L    Comment: Performed at [facility], [address], [place], [place] [address]  Resp panel by RT-PCR (RSV, Flu A&B, Covid) Anterior Nasal Swab     Status: None   Collection Time: 06/29/23  8:17 PM   Specimen: Anterior Nasal Swab  Result Value Ref Range   SARS Coronavirus 2 by RT PCR NEGATIVE NEGATIVE    Comment: (NOTE) SARS-CoV-2 target nucleic acids are NOT DETECTED.  The SARS-CoV-2 RNA is generally detectable in upper respiratory specimens during the acute phase of infection. The lowest concentration of SARS-CoV-2 viral copies this assay can detect is 138 copies/mL. A negative result does not preclude SARS-Cov-2 infection and should not be used as the sole basis for treatment or other patient management decisions. A negative result may occur with  improper specimen collection/handling,  submission of specimen other than nasopharyngeal swab, presence of viral mutation(s) within the areas targeted by this assay, and inadequate number of viral copies(<138 copies/mL). A negative result must be combined with clinical observations, patient history, and epidemiological information. The expected result is Negative.  Fact Sheet for Patients:  BloggerCourse.com  Fact Sheet for Healthcare Providers:  SeriousBroker.it  This test is no t yet approved  or cleared by the [place] and  has been authorized for detection and/or diagnosis of SARS-CoV-2 by FDA under an Emergency Use Authorization (EUA). This EUA will remain  in effect (meaning this test can be used) for the duration of the COVID-19 declaration under Section 564(b)(1) of the Act, 21 U.S.C.section 360bbb-3(b)(1), unless the authorization is terminated  or revoked sooner.       Influenza A by PCR NEGATIVE NEGATIVE   Influenza B by PCR NEGATIVE NEGATIVE    Comment: (NOTE) The Xpert Xpress SARS-CoV-2/FLU/RSV plus assay is intended as an aid in the diagnosis of influenza from Nasopharyngeal swab specimens and should not be used as a sole basis for treatment. Nasal washings and aspirates are unacceptable for Xpert Xpress SARS-CoV-2/FLU/RSV testing.  Fact Sheet for Patients: BloggerCourse.com  Fact Sheet for Healthcare Providers: SeriousBroker.it  This test is not yet approved or cleared by the [place] FDA and has been authorized for detection and/or diagnosis of SARS-CoV-2 by FDA under an Emergency Use Authorization (EUA). This EUA will remain in effect (meaning this test can be used) for the duration of the COVID-19 declaration under Section 564(b)(1) of the Act, 21 U.S.C. section 360bbb-3(b)(1), unless the authorization is terminated or revoked.     Resp Syncytial Virus by PCR NEGATIVE NEGATIVE     Comment: (NOTE) Fact Sheet for Patients: BloggerCourse.com  Fact Sheet for Healthcare Providers: SeriousBroker.it  This test is not yet approved or cleared by the [place] FDA and has been authorized for detection and/or diagnosis of SARS-CoV-2 by FDA under an Emergency Use Authorization (EUA). This EUA will remain in effect (meaning this test can be used) for the duration of the COVID-19 declaration under Section 564(b)(1) of the Act, 21 U.S.C. section 360bbb-3(b)(1), unless the authorization is terminated or revoked.  Performed at [facility], [address] [place], [place] [address]   Urinalysis, Routine w reflex microscopic -Urine, Catheterized; In and out catheter     Status: Abnormal   Collection Time: 06/29/23  8:23 PM  Result Value Ref Range   Color, Urine STRAW (A) YELLOW   APPearance CLEAR CLEAR   Specific Gravity, Urine 1.008 1.005 - 1.030   pH 8.0 5.0 - 8.0   Glucose, UA NEGATIVE NEGATIVE mg/dL   Hgb urine dipstick NEGATIVE NEGATIVE   Bilirubin Urine NEGATIVE NEGATIVE   Ketones, ur NEGATIVE NEGATIVE mg/dL   Protein, ur NEGATIVE NEGATIVE mg/dL   Nitrite NEGATIVE NEGATIVE   Leukocytes,Ua NEGATIVE NEGATIVE    Comment: Performed at [facility], [address], [place], [place] [address]  Comprehensive metabolic panel     Status: Abnormal   Collection Time: 06/29/23  9:35 PM  Result Value Ref Range   Sodium 132 (L) 135 - 145 mmol/L   Potassium 3.7 3.5 - 5.1 mmol/L   Chloride 95 (L) 98 - 111 mmol/L   CO2 27 22 - 32 mmol/L   Glucose, Bld 168 (H) 70 - 99 mg/dL    Comment: Glucose reference range applies only to samples taken after fasting for at least 8 hours.   BUN 14 8 - 23 mg/dL   Creatinine, Ser 9.14 0.44 - 1.00 mg/dL   Calcium 9.4 8.9 - 78.2 mg/dL   Total Protein 5.9 (L) 6.5 - 8.1 g/dL   Albumin 2.4 (L) 3.5 - 5.0 g/dL   AST 956 (H) 15 - 41 U/L   ALT 65 (H) 0 - 44  U/L   Alkaline Phosphatase 443 (H) 38 -  126 U/L   Total Bilirubin 0.7 0.3 - 1.2 mg/dL   GFR, Estimated >16 >10 mL/min    Comment: (NOTE) Calculated using the CKD-EPI Creatinine Equation (2021)    Anion gap 10 5 - 15    Comment: Performed at [facility], [address], [place], [place] [address]   DG Chest Port 1 View  Result Date: 06/29/2023 CLINICAL DATA:  Fever, cough, hypoxia, weakness, fatigue. Metastatic breast cancer. EXAM: PORTABLE CHEST 1 VIEW COMPARISON:  06/21/2023 FINDINGS: Retrocardiac airspace opacity is mildly increased and likely represents combination of known pleural effusion and atelectasis although a component of pneumonia cannot be excluded. Atherosclerotic calcification of the aortic arch. Right axillary lymph node clips. Bony demineralization. The right lung appears clear. IMPRESSION: 1. Retrocardiac airspace opacity is mildly increased and likely represents combination of known pleural effusion and atelectasis although a component of pneumonia cannot be excluded. 2.  Aortic Atherosclerosis (ICD10-I70.0). Electronically Signed   By: Gaylyn Rong M.D.   On: 06/29/2023 20:39    Pending Labs Unresulted Labs (From admission, onward)     Start     Ordered   07/06/23 0500  Creatinine, serum  (enoxaparin (LOVENOX)    CrCl >/= 30 ml/min)  Weekly,   R     Comments: while on enoxaparin therapy    06/29/23 2239   06/30/23 0500  Procalcitonin  Tomorrow morning,   R       References:    Procalcitonin Lower Respiratory Tract Infection AND Sepsis Procalcitonin Algorithm   06/29/23 2237   06/30/23 0500  Comprehensive metabolic panel  Tomorrow morning,   R        06/29/23 2239   06/30/23 0500  Magnesium  Tomorrow morning,   R        06/29/23 2239   06/30/23 0500  CBC with Differential/Platelet  Tomorrow morning,   R        06/29/23 2239   06/30/23 0500  Creatinine, serum  Daily,   R      06/29/23 2250   06/29/23 2238  Procalcitonin  Once,   URGENT        References:    Procalcitonin Lower Respiratory Tract Infection AND Sepsis Procalcitonin Algorithm   06/29/23 2237   06/29/23 2009  Blood culture (routine x 2)  BLOOD CULTURE X 2,   R (with STAT occurrences)      06/29/23 2008   06/29/23 2006  Urine Culture  Once,   URGENT       Question:  Indication  Answer:  Dysuria   06/29/23 2005            Vitals/Pain Today's Vitals   06/29/23 2135 06/29/23 2200 06/29/23 2230 06/29/23 2245  BP: (!) 94/52 (!) 100/47 (!) 107/48 (!) 111/47  Pulse: 69 68 67 66  Resp: (!) 27 (!) 22 19 18   Temp:   (!) 97.4 F (36.3 C)   TempSrc:   Oral   SpO2: 100% 100% 100% 100%  Weight:      PainSc:        Isolation Precautions No active isolations  Medications Medications  enoxaparin (LOVENOX) injection 40 mg (has no administration in time range)  acetaminophen (TYLENOL) tablet 650 mg (has no administration in time range)    Or  acetaminophen (TYLENOL) suppository 650 mg (has no administration in time range)  senna-docusate (Senokot-S) tablet 1 tablet (has no administration in time range)  ondansetron (ZOFRAN) tablet 4 mg (has no administration in time range)  Or  ondansetron (ZOFRAN) injection 4 mg (has no administration in time range)  piperacillin-tazobactam (ZOSYN) IVPB 3.375 g (has no administration in time range)  vancomycin (VANCOREADY) IVPB 1250 mg/250 mL (has no administration in time range)  acetaminophen (TYLENOL) tablet 650 mg (650 mg Oral Given 06/29/23 2037)  vancomycin (VANCOCIN) IVPB 1000 mg/200 mL premix (0 mg Intravenous Stopped 06/29/23 2234)  piperacillin-tazobactam (ZOSYN) IVPB 3.375 g (0 g Intravenous Stopped 06/29/23 2128)  sodium chloride 0.9 % bolus 1,000 mL (1,000 mLs Intravenous New Bag/Given 06/29/23 2146)    Mobility non-ambulatory

## 2023-06-29 NOTE — ED Triage Notes (Addendum)
Pt arrives EMS from home with reports of generalized weakness and fatigue since wednesday. Per facility pt was admitted for pneumonia recently. Pt has dementia at baseline and is oriented x2. Pt has indwelling foley in place.

## 2023-06-29 NOTE — Progress Notes (Signed)
Pharmacy Antibiotic Note  Amanda Green is a 88 y.o. female admitted on 06/29/2023 with  history of dementia, recurrent UTI with indwelling Foley, recent admission for pneumonia here presenting with weakness and altered mental status.  Patient recently was admitted for pneumonia.Pharmacy has been consulted to dose vancomycin and zosyn for HCAP.  1st doses given in the ED  Plan: Vancomcyin 1250mg  IV q36 (AUC 478.7, Scr used 0.8) Zosyn 3.375g IV Q8H infused over 4hrs. Follow renal function, cultures and clinical course  Weight: 61.9 kg (136 lb 6.4 oz)  Temp (24hrs), Avg:99.6 F (37.6 C), Min:97.4 F (36.3 C), Max:101.7 F (38.7 C)  Recent Labs  Lab 06/23/23 0157 06/25/23 1312 06/29/23 2000 06/29/23 2135  WBC 7.4 6.4 18.4*  --   CREATININE  --  0.50  --  0.59  LATICACIDVEN  --   --  1.4  --     Estimated Creatinine Clearance: 41.8 mL/min (by C-G formula based on SCr of 0.59 mg/dL).    Allergies  Allergen Reactions   Neurontin [Gabapentin] Other (See Comments)    Drowsiness    Antimicrobials this admission: 7/13 vanc >> 7/13 zosyn >>  Dose adjustments this admission:   Microbiology results: 7/13 BCx:  7/13 UCx:   Thank you for allowing pharmacy to be a part of this patient's care. Arley Phenix RPh 06/29/2023, 10:48 PM

## 2023-06-30 ENCOUNTER — Encounter (HOSPITAL_COMMUNITY): Payer: Self-pay | Admitting: Family Medicine

## 2023-06-30 DIAGNOSIS — Z8673 Personal history of transient ischemic attack (TIA), and cerebral infarction without residual deficits: Secondary | ICD-10-CM

## 2023-06-30 DIAGNOSIS — I10 Essential (primary) hypertension: Secondary | ICD-10-CM

## 2023-06-30 DIAGNOSIS — C50919 Malignant neoplasm of unspecified site of unspecified female breast: Secondary | ICD-10-CM | POA: Diagnosis not present

## 2023-06-30 DIAGNOSIS — I5030 Unspecified diastolic (congestive) heart failure: Secondary | ICD-10-CM

## 2023-06-30 DIAGNOSIS — E854 Organ-limited amyloidosis: Secondary | ICD-10-CM | POA: Diagnosis not present

## 2023-06-30 DIAGNOSIS — A419 Sepsis, unspecified organism: Principal | ICD-10-CM

## 2023-06-30 DIAGNOSIS — I482 Chronic atrial fibrillation, unspecified: Secondary | ICD-10-CM

## 2023-06-30 DIAGNOSIS — I43 Cardiomyopathy in diseases classified elsewhere: Secondary | ICD-10-CM

## 2023-06-30 DIAGNOSIS — J189 Pneumonia, unspecified organism: Secondary | ICD-10-CM | POA: Diagnosis not present

## 2023-06-30 DIAGNOSIS — R7989 Other specified abnormal findings of blood chemistry: Secondary | ICD-10-CM

## 2023-06-30 LAB — CBC WITH DIFFERENTIAL/PLATELET
Abs Immature Granulocytes: 0.15 10*3/uL — ABNORMAL HIGH (ref 0.00–0.07)
Basophils Absolute: 0 10*3/uL (ref 0.0–0.1)
Basophils Relative: 0 %
Eosinophils Absolute: 0 10*3/uL (ref 0.0–0.5)
Eosinophils Relative: 0 %
HCT: 28.2 % — ABNORMAL LOW (ref 36.0–46.0)
Hemoglobin: 9.1 g/dL — ABNORMAL LOW (ref 12.0–15.0)
Immature Granulocytes: 1 %
Lymphocytes Relative: 8 %
Lymphs Abs: 1.2 10*3/uL (ref 0.7–4.0)
MCH: 29.4 pg (ref 26.0–34.0)
MCHC: 32.3 g/dL (ref 30.0–36.0)
MCV: 91 fL (ref 80.0–100.0)
Monocytes Absolute: 1.3 10*3/uL — ABNORMAL HIGH (ref 0.1–1.0)
Monocytes Relative: 9 %
Neutro Abs: 12.3 10*3/uL — ABNORMAL HIGH (ref 1.7–7.7)
Neutrophils Relative %: 82 %
Platelets: 266 10*3/uL (ref 150–400)
RBC: 3.1 MIL/uL — ABNORMAL LOW (ref 3.87–5.11)
RDW: 18.9 % — ABNORMAL HIGH (ref 11.5–15.5)
WBC: 15 10*3/uL — ABNORMAL HIGH (ref 4.0–10.5)
nRBC: 0 % (ref 0.0–0.2)

## 2023-06-30 LAB — COMPREHENSIVE METABOLIC PANEL
ALT: 54 U/L — ABNORMAL HIGH (ref 0–44)
AST: 231 U/L — ABNORMAL HIGH (ref 15–41)
Albumin: 2.2 g/dL — ABNORMAL LOW (ref 3.5–5.0)
Alkaline Phosphatase: 368 U/L — ABNORMAL HIGH (ref 38–126)
Anion gap: 11 (ref 5–15)
BUN: 13 mg/dL (ref 8–23)
CO2: 26 mmol/L (ref 22–32)
Calcium: 9.4 mg/dL (ref 8.9–10.3)
Chloride: 100 mmol/L (ref 98–111)
Creatinine, Ser: 0.46 mg/dL (ref 0.44–1.00)
GFR, Estimated: >60 mL/min (ref >60)
Glucose, Bld: 120 mg/dL — ABNORMAL HIGH (ref 70–99)
Potassium: 3.3 mmol/L — ABNORMAL LOW (ref 3.5–5.1)
Sodium: 137 mmol/L (ref 135–145)
Total Bilirubin: 0.7 mg/dL (ref 0.3–1.2)
Total Protein: 5.3 g/dL — ABNORMAL LOW (ref 6.5–8.1)

## 2023-06-30 LAB — PROTIME-INR
INR: 1.3 — ABNORMAL HIGH (ref 0.8–1.2)
Prothrombin Time: 15.9 seconds — ABNORMAL HIGH (ref 11.4–15.2)

## 2023-06-30 LAB — PROCALCITONIN
Procalcitonin: 2.59 ng/mL
Procalcitonin: 3.37 ng/mL

## 2023-06-30 LAB — AMMONIA: Ammonia: 39 umol/L — ABNORMAL HIGH (ref 9–35)

## 2023-06-30 LAB — MRSA NEXT GEN BY PCR, NASAL: MRSA by PCR Next Gen: NOT DETECTED

## 2023-06-30 LAB — MAGNESIUM: Magnesium: 1.7 mg/dL (ref 1.7–2.4)

## 2023-06-30 MED ORDER — POTASSIUM CHLORIDE CRYS ER 20 MEQ PO TBCR
40.0000 meq | EXTENDED_RELEASE_TABLET | Freq: Once | ORAL | Status: AC
  Administered 2023-06-30: 40 meq via ORAL
  Filled 2023-06-30: qty 2

## 2023-06-30 MED ORDER — METOPROLOL TARTRATE 50 MG PO TABS
50.0000 mg | ORAL_TABLET | Freq: Two times a day (BID) | ORAL | Status: DC
  Administered 2023-06-30 – 2023-07-03 (×7): 50 mg via ORAL
  Filled 2023-06-30 (×7): qty 1

## 2023-06-30 MED ORDER — METOPROLOL TARTRATE 50 MG PO TABS: 50.0000 mg | ORAL_TABLET | Freq: Two times a day (BID) | ORAL | Status: DC

## 2023-06-30 MED ORDER — CHLORHEXIDINE GLUCONATE CLOTH 2 % EX PADS
6.0000 | MEDICATED_PAD | Freq: Every day | CUTANEOUS | Status: DC
  Administered 2023-06-30 – 2023-07-03 (×4): 6 via TOPICAL

## 2023-06-30 MED ORDER — TAFAMIDIS 61 MG PO CAPS
61.0000 mg | ORAL_CAPSULE | Freq: Every day | ORAL | Status: DC
  Administered 2023-06-30 – 2023-07-03 (×4): 61 mg via ORAL

## 2023-06-30 MED ORDER — LACTULOSE 10 GM/15ML PO SOLN
20.0000 g | Freq: Two times a day (BID) | ORAL | Status: DC
  Administered 2023-06-30 – 2023-07-03 (×6): 20 g via ORAL
  Filled 2023-06-30 (×7): qty 30

## 2023-06-30 MED ORDER — PNEUMOCOCCAL 20-VAL CONJ VACC 0.5 ML IM SUSY
0.5000 mL | PREFILLED_SYRINGE | INTRAMUSCULAR | Status: DC
  Filled 2023-06-30: qty 0.5

## 2023-06-30 MED ORDER — SPIRONOLACTONE 25 MG PO TABS: 25.0000 mg | ORAL_TABLET | Freq: Every day | ORAL | Status: DC

## 2023-06-30 MED ORDER — FUROSEMIDE 20 MG PO TABS
20.0000 mg | ORAL_TABLET | Freq: Every day | ORAL | Status: DC
  Administered 2023-06-30: 20 mg via ORAL
  Filled 2023-06-30: qty 1

## 2023-06-30 MED ORDER — MEDIHONEY WOUND/BURN DRESSING EX PSTE
1.0000 | PASTE | Freq: Every day | CUTANEOUS | Status: DC
  Administered 2023-06-30 – 2023-07-03 (×4): 1 via TOPICAL
  Filled 2023-06-30: qty 44

## 2023-06-30 MED ORDER — MEMANTINE HCL 10 MG PO TABS
10.0000 mg | ORAL_TABLET | Freq: Two times a day (BID) | ORAL | Status: DC
  Administered 2023-06-30 – 2023-07-03 (×7): 10 mg via ORAL
  Filled 2023-06-30 (×7): qty 1

## 2023-06-30 MED ORDER — LOSARTAN POTASSIUM 50 MG PO TABS: 25.0000 mg | ORAL_TABLET | Freq: Every day | ORAL | Status: DC

## 2023-06-30 MED ORDER — MAGNESIUM SULFATE 2 GM/50ML IV SOLN
2.0000 g | Freq: Once | INTRAVENOUS | Status: AC
  Administered 2023-06-30: 2 g via INTRAVENOUS
  Filled 2023-06-30: qty 50

## 2023-06-30 NOTE — Consult Note (Signed)
WOC Nurse Consult Note: Reason for Consult:Stage 2 pressure injury to sacrum, stage 3 pressure injury to right heel. Last seen by this writer on 06/23/23. Wound type:Pressure Pressure Injury POA: Yes Measurement:Per Nursing Flow Sheet for right heel: 2.5cm round x 0.2cm with 50% black, nonviable tissue and 50% viable pink tissue.  Wound bed:As noted above Drainage (amount, consistency, odor) scant light yellow consistent with autolytically debriding tissue Periwound: intact Dressing procedure/placement/frequency:I have provided guidance for Nursing in the care of this and the sacral ulcer as well as the recently reepithelialized left heel pressure injury. Topical care to the sacrum and left heel will be with a daily cleanse followed by an application of Manuka honey topped with dry gauze and secured with silicone foam. Heels are to be placed into Prevalon boots.  WOC nursing team will not follow, but will remain available to this patient, the nursing and medical teams.  Please re-consult if needed.  Thank you for inviting [place] to participate in this patient's Plan of Care.  Ladona Mow, MSN, RN, CNS, GNP, Leda Min, [employer], [employer] phone:  [phone]

## 2023-06-30 NOTE — Progress Notes (Signed)
Amanda Green                                                                               Amanda Green, is a 88 y.o. female, DOB - [birth year], FAO:[id number] Admit date - 06/29/2023    Outpatient Primary MD for the patient is [name], [name], MD  LOS - 1  days    Brief summary   88 year old female with past medical history of HTN, dementia, HFpEF, paroxysmal atrial fibrillation, cardiac amyloidosis, breast cancer with brain and liver mets, history of CVA, and chronic indwelling Foley catheter since last admission.  Patient recently admitted 7/5- 7/7 with encephalopathy and declining functional status.   Chest x-ray shows retrocardiac airspace opacity mild increased likely represents combination of known pleural effusion and atelectasis, some component of pneumonia cannot be excluded. Blood cultures x 2 collected, treatment started with IV Rocephin and azithromycin. Patient is  hypoxic and  placed on 2 L nasal cannula.   Assessment & Plan    Assessment and Plan:   Acute respiratory failure with hypoxia secondary to sepsis from pneumonia.   Febrile on admission,with RR 36/min,  BP 94/52 mm hg hypotensive, hypoxic with oxygen sats around 88% on RA, leukocytosis of 15,000, lactic acid wnl.  She was started on IV antibiotics empirically.  Currently on 2 lit/min [place] oxygen.  Follow up blood cultures.  Pro calcitonin is 3.37.  COVID 19 PCR is negative.  Respiratory panel is negative.  UA negative.  CXR shows Retrocardiac airspace opacity is mildly increased and likely represents combination of known pleural effusion and atelectasis although a component of pneumonia cannot be excluded.  Acute metabolic and toxic encephalopathy  with a component of hepatic encephalopathy.  Ammonia elevated at 39. Also from pneumonia.  She is more alert and answering questions appropriately.    Left breast invasive carcinoma with underlying DCIS/ bilateral mastectomies.  With metastatic  lesions to the brain and about 40 metastatic lesions in the liver evident on 06/18/23.  Unable to start the Herceptin injections due to insurance issues.  She follows up with Dr Pamelia Hoit in the office.    Elevated liver enzymes, alk phos, AST, ALT  - probably from liver mets.    Anemia of chronic disease Hemoglobin of 9.  Transfuse to keep hemoglobin greater than 7.    Hypokalemia/ hypomagnesemia: Replaced.    Hypertension:  Well controlled.    Dementia  Continue with namenda.   Chronic indwelling foley catheter:   Chronic HFpEF secondary to TTR amyloidosis:  She is euvolemic, will hold the lasix in view of her pneumonia.   Paroxysmal atrial fibrillation:  Rate controlled with metoprolol 50 mg BID.  Not on anti coagulation due to brain mets and increased risk of bleeding.   H/o CVA No focal deficits.  Type 2 DM:  Diet controlled.  A1c is 6.5 % in 2 /2024.    RN Pressure Injury Documentation: Pressure Injury 03/09/23 Buttocks Right Stage 2 -  Partial thickness loss of dermis presenting as a shallow open injury with a red, pink wound bed without slough. less than 1cm size. pink/red (Active)  03/09/23 1854  Location: Buttocks  Location Orientation: Right  Staging: Stage 2 -  Partial thickness loss of dermis presenting as a shallow open injury with a red, pink wound bed without slough.  Wound Description (Comments): less than 1cm size. pink/red  Present on Admission: Yes  Dressing Type Foam - Lift dressing to assess site every shift 06/23/23 1058     Pressure Injury 06/22/23 Heel Right Stage 3 -  Full thickness tissue loss. Subcutaneous fat may be visible but bone, tendon or muscle are NOT exposed. (Active)  06/22/23 0925  Location: Heel  Location Orientation: Right  Staging: Stage 3 -  Full thickness tissue loss. Subcutaneous fat may be visible but bone, tendon or muscle are NOT exposed.  Wound Description (Comments):   Present on Admission: Yes  Dressing Type  Foam - Lift dressing to assess site every shift 06/29/23 2340     Pressure Injury 06/29/23 Sacrum Right;Left;Medial Stage 2 -  Partial thickness loss of dermis presenting as a shallow open injury with a red, pink wound bed without slough. (Active)  06/29/23 2340  Location: Sacrum  Location Orientation: Right;Left;Medial  Staging: Stage 2 -  Partial thickness loss of dermis presenting as a shallow open injury with a red, pink wound bed without slough.  Wound Description (Comments):   Present on Admission: Yes  Dressing Type Foam - Lift dressing to assess site every shift 06/29/23 2340   Wound care consulted and recommendations given.     Estimated body mass index is 24.16 kg/m as calculated from the following:   Height as of 06/27/23: 5\' 3"  (1.6 m).   Weight as of this encounter: 61.9 kg.  Code Status: DNR DVT Prophylaxis:  enoxaparin (LOVENOX) injection 40 mg Start: 06/30/23 1000   Level of Care: Level of care: Med-Surg Family Communication:  FAMIILY at bedside.   Disposition Plan:     Remains inpatient appropriate: IV antibiotics.   Procedures:  None.  Consultants:   None.   Antimicrobials:   Anti-infectives (From admission, onward)    Start     Dose/Rate Route Frequency Ordered Stop   07/01/23 1000  vancomycin (VANCOREADY) IVPB 1250 mg/250 mL        1,250 mg 166.7 mL/hr over 90 Minutes Intravenous Every 36 hours 06/29/23 2250     06/30/23 0400  piperacillin-tazobactam (ZOSYN) IVPB 3.375 g        3.375 g 12.5 mL/hr over 240 Minutes Intravenous Every 8 hours 06/29/23 2250     06/29/23 2045  vancomycin (VANCOCIN) IVPB 1000 mg/200 mL premix        1,000 mg 200 mL/hr over 60 Minutes Intravenous  Once 06/29/23 2042 06/29/23 2234   06/29/23 2045  piperacillin-tazobactam (ZOSYN) IVPB 3.375 g        3.375 g 100 mL/hr over 30 Minutes Intravenous  Once 06/29/23 2042 06/29/23 2128        Medications  Scheduled Meds:  enoxaparin (LOVENOX) injection  40 mg Subcutaneous  Q24H   furosemide  20 mg Oral Daily   lactulose  20 g Oral BID   leptospermum manuka honey  1 Application Topical Daily   memantine  10 mg Oral BID   metoprolol tartrate  50 mg Oral BID   potassium chloride  40 mEq Oral Once   Tafamidis  61 mg Oral Daily   Continuous Infusions:  magnesium sulfate bolus IVPB     piperacillin-tazobactam (ZOSYN)  IV 3.375 g (06/30/23 1132)   [START ON 07/01/2023] vancomycin     PRN Meds:.acetaminophen **OR** acetaminophen, ondansetron **OR** ondansetron (ZOFRAN)  IV, senna-docusate    Subjective:   Annaka Cosley was seen and examined today.  No new complaints today.  Objective:   Vitals:   06/29/23 2341 06/30/23 0445 06/30/23 0728 06/30/23 1205  BP: (!) 103/51 (!) 106/46 (!) 127/56 (!) 113/52  Pulse: 66 62 65 66  Resp: 18 16 (!) 22 20  Temp: 98 F (36.7 C) 98.1 F (36.7 C) 97.9 F (36.6 C) (!) 97.5 F (36.4 C)  TempSrc: Oral Oral Oral Oral  SpO2: 100% 100% 100% 100%  Weight:        Intake/Output Summary (Last 24 hours) at 06/30/2023 1241 Last data filed at 06/30/2023 0445 Gross per 24 hour  Intake 301.9 ml  Output 550 ml  Net -248.1 ml   Filed Weights   06/29/23 1956  Weight: 61.9 kg     Exam General exam: Appears calm and comfortable  Respiratory system: Clear to auscultation. Respiratory effort normal. Cardiovascular system: S1 & S2 heard, RRR. No JVD,  Gastrointestinal system: Abdomen is nondistended, soft and nontender.  Central nervous system: Alert and oriented. No focal neurological deficits. Extremities: no pedal edema. Skin: sacral decubitus ulcer, stage 3 pressure injury to right heel. Psychiatry: calm ,without any agitation.     Data Reviewed:  I have personally reviewed following labs and imaging studies   CBC Lab Results  Component Value Date   WBC 15.0 (H) 06/30/2023   RBC 3.10 (L) 06/30/2023   HGB 9.1 (L) 06/30/2023   HCT 28.2 (L) 06/30/2023   MCV 91.0 06/30/2023   MCH 29.4 06/30/2023   PLT 266  06/30/2023   MCHC 32.3 06/30/2023   RDW 18.9 (H) 06/30/2023   LYMPHSABS 1.2 06/30/2023   MONOABS 1.3 (H) 06/30/2023   EOSABS 0.0 06/30/2023   BASOSABS 0.0 06/30/2023     Last metabolic panel Lab Results  Component Value Date   NA 137 06/30/2023   K 3.3 (L) 06/30/2023   CL 100 06/30/2023   CO2 26 06/30/2023   BUN 13 06/30/2023   CREATININE 0.46 06/30/2023   GLUCOSE 120 (H) 06/30/2023   GFRNONAA >60 06/30/2023   GFRAA 72 02/07/2021   CALCIUM 9.4 06/30/2023   PHOS 2.2 (L) 03/19/2023   PROT 5.3 (L) 06/30/2023   ALBUMIN 2.2 (L) 06/30/2023   LABGLOB 3.4 01/03/2022   AGRATIO 1.5 02/07/2021   BILITOT 0.7 06/30/2023   ALKPHOS 368 (H) 06/30/2023   AST 231 (H) 06/30/2023   ALT 54 (H) 06/30/2023   ANIONGAP 11 06/30/2023    CBG (last 3)  No results for input(s): "GLUCAP" in the last 72 hours.    Coagulation Profile: Recent Labs  Lab 06/30/23 0831  INR 1.3*     Radiology Studies: [facility] Chest Port 1 View  Result Date: 06/29/2023 CLINICAL DATA:  Fever, cough, hypoxia, weakness, fatigue. Metastatic breast cancer. EXAM: PORTABLE CHEST 1 VIEW COMPARISON:  06/21/2023 FINDINGS: Retrocardiac airspace opacity is mildly increased and likely represents combination of known pleural effusion and atelectasis although a component of pneumonia cannot be excluded. Atherosclerotic calcification of the aortic arch. Right axillary lymph node clips. Bony demineralization. The right lung appears clear. IMPRESSION: 1. Retrocardiac airspace opacity is mildly increased and likely represents combination of known pleural effusion and atelectasis although a component of pneumonia cannot be excluded. 2.  Aortic Atherosclerosis (ICD10-I70.0). Electronically Signed   By: Gaylyn Rong M.D.   On: 06/29/2023 20:39       Kathlen Mody M.D. Amanda Green 06/30/2023, 12:41 PM  Available via Epic secure chat  7am-7pm After 7 pm, please refer to night coverage provider listed on amion.

## 2023-07-01 ENCOUNTER — Encounter: Payer: Self-pay | Admitting: Hematology and Oncology

## 2023-07-01 ENCOUNTER — Telehealth: Payer: Self-pay

## 2023-07-01 DIAGNOSIS — A419 Sepsis, unspecified organism: Secondary | ICD-10-CM | POA: Diagnosis not present

## 2023-07-01 DIAGNOSIS — J189 Pneumonia, unspecified organism: Secondary | ICD-10-CM | POA: Diagnosis not present

## 2023-07-01 DIAGNOSIS — C50919 Malignant neoplasm of unspecified site of unspecified female breast: Secondary | ICD-10-CM | POA: Diagnosis not present

## 2023-07-01 DIAGNOSIS — E854 Organ-limited amyloidosis: Secondary | ICD-10-CM | POA: Diagnosis not present

## 2023-07-01 LAB — COMPREHENSIVE METABOLIC PANEL
ALT: 45 U/L — ABNORMAL HIGH (ref 0–44)
AST: 120 U/L — ABNORMAL HIGH (ref 15–41)
Albumin: 2.1 g/dL — ABNORMAL LOW (ref 3.5–5.0)
Alkaline Phosphatase: 370 U/L — ABNORMAL HIGH (ref 38–126)
Anion gap: 8 (ref 5–15)
BUN: 11 mg/dL (ref 8–23)
CO2: 27 mmol/L (ref 22–32)
Calcium: 9.2 mg/dL (ref 8.9–10.3)
Chloride: 101 mmol/L (ref 98–111)
Creatinine, Ser: 0.49 mg/dL (ref 0.44–1.00)
GFR, Estimated: >60 mL/min (ref >60)
Glucose, Bld: 88 mg/dL (ref 70–99)
Potassium: 3.4 mmol/L — ABNORMAL LOW (ref 3.5–5.1)
Sodium: 136 mmol/L (ref 135–145)
Total Bilirubin: 0.9 mg/dL (ref 0.3–1.2)
Total Protein: 5.5 g/dL — ABNORMAL LOW (ref 6.5–8.1)

## 2023-07-01 LAB — CBC WITH DIFFERENTIAL/PLATELET
Abs Immature Granulocytes: 0.11 10*3/uL — ABNORMAL HIGH (ref 0.00–0.07)
Basophils Absolute: 0 10*3/uL (ref 0.0–0.1)
Basophils Relative: 0 %
Eosinophils Absolute: 0 10*3/uL (ref 0.0–0.5)
Eosinophils Relative: 0 %
HCT: 26.7 % — ABNORMAL LOW (ref 36.0–46.0)
Hemoglobin: 8.6 g/dL — ABNORMAL LOW (ref 12.0–15.0)
Immature Granulocytes: 1 %
Lymphocytes Relative: 8 %
Lymphs Abs: 1.2 10*3/uL (ref 0.7–4.0)
MCH: 28.9 pg (ref 26.0–34.0)
MCHC: 32.2 g/dL (ref 30.0–36.0)
MCV: 89.6 fL (ref 80.0–100.0)
Monocytes Absolute: 1 10*3/uL (ref 0.1–1.0)
Monocytes Relative: 7 %
Neutro Abs: 12.6 10*3/uL — ABNORMAL HIGH (ref 1.7–7.7)
Neutrophils Relative %: 84 %
Platelets: 289 10*3/uL (ref 150–400)
RBC: 2.98 MIL/uL — ABNORMAL LOW (ref 3.87–5.11)
RDW: 18.9 % — ABNORMAL HIGH (ref 11.5–15.5)
WBC: 15 10*3/uL — ABNORMAL HIGH (ref 4.0–10.5)
nRBC: 0 % (ref 0.0–0.2)

## 2023-07-01 LAB — URINE CULTURE: Culture: NO GROWTH

## 2023-07-01 MED ORDER — ENSURE ENLIVE PO LIQD
237.0000 mL | ORAL | Status: DC
  Administered 2023-07-02 – 2023-07-03 (×2): 237 mL via ORAL

## 2023-07-01 MED ORDER — POTASSIUM CHLORIDE CRYS ER 20 MEQ PO TBCR
40.0000 meq | EXTENDED_RELEASE_TABLET | Freq: Once | ORAL | Status: AC
  Administered 2023-07-01: 40 meq via ORAL
  Filled 2023-07-01: qty 2

## 2023-07-01 MED ORDER — JUVEN PO PACK
1.0000 | PACK | Freq: Two times a day (BID) | ORAL | Status: DC
  Administered 2023-07-01 – 2023-07-03 (×5): 1 via ORAL
  Filled 2023-07-01 (×5): qty 1

## 2023-07-01 MED ORDER — SODIUM CHLORIDE 0.9 % IV SOLN
500.0000 mg | INTRAVENOUS | Status: DC
  Administered 2023-07-01 – 2023-07-02 (×2): 500 mg via INTRAVENOUS
  Filled 2023-07-01 (×3): qty 5

## 2023-07-01 MED ORDER — SODIUM CHLORIDE 0.9 % IV SOLN
2.0000 g | INTRAVENOUS | Status: DC
  Administered 2023-07-01 – 2023-07-03 (×3): 2 g via INTRAVENOUS
  Filled 2023-07-01 (×3): qty 20

## 2023-07-01 NOTE — Evaluation (Addendum)
Occupational Therapy Evaluation Patient Details Name: Amanda Green MRN: [id number] DOB: 1935-01-17 Today's Date: 07/01/2023   History of Present Illness 88 yo female presents to therapy s/p hospital admission on 06/29/2023 due to progressive weakness, AMS, cough and congestion. Pt recently 06/23/2023 d/c from acute hospital secondary to encephalopathy. CXR reveals retrocardiac airspace opacity, pleural effusion and atelectasis.Pt currently being treated with IV ABX and supplemental O2 due to acute respiratory failure with hypoxia secondary to sepsis associated with HCAP. Pt PMH includes but is not limited to: metastatic Ba Ca B mastectomy, CVA, DM II, PAF, HDL, HTN, memory loss, CVA, paresthesia, TKA and R heel stage III pressure wound and stage II sacral pressure wound.   Clinical Impression   Pt dependent at baseline for mobility with use of hoyer lift and all ADLs except self-feeding and grooming tasks. Lives with family who can provide 24/7 assist. Pt currently needing set up -total A for ADLs, lethargic keeping eyes closed during most of session. Pt disoriented to time/situation and needs increased cues to open eyes/follow commands during session. Pt provided with green squeeze foam to promote hand strengthening, able to demonstrate during session. Pt presenting with impairments listed below, will follow acutely. Recommend HHOT at d/c.      Recommendations for follow up therapy are one component of a multi-disciplinary discharge planning process, led by the attending physician.  Recommendations may be updated based on patient status, additional functional criteria and insurance authorization.   Assistance Recommended at Discharge Frequent or constant Supervision/Assistance  Patient can return home with the following Two people to help with walking and/or transfers;Two people to help with bathing/dressing/bathroom;Assistance with cooking/housework;Assistance with feeding;Direct  supervision/assist for medications management;Assist for transportation;Help with stairs or ramp for entrance;Direct supervision/assist for financial management    Functional Status Assessment  Patient has had a recent decline in their functional status and demonstrates the ability to make significant improvements in function in a reasonable and predictable amount of time.  Equipment Recommendations  None recommended by OT (pt has all needed DME)    Recommendations for Other Services PT consult     Precautions / Restrictions Precautions Precautions: Fall Restrictions Weight Bearing Restrictions: No      Mobility Bed Mobility Overal bed mobility: Needs Assistance             General bed mobility comments: pt keeping eyes closed, mod cues to remain attentive and follow basic commands, pt tolerates bed/chair position to eat lunch    Transfers                   General transfer comment: deferred      Balance                                           ADL either performed or assessed with clinical judgement   ADL Overall ADL's : Needs assistance/impaired Eating/Feeding: Moderate assistance;Bed level Eating/Feeding Details (indicate cue type and reason): daughter feeding pt Grooming: Set up;Bed level;Wash/dry face Grooming Details (indicate cue type and reason): washes face at bed level Upper Body Bathing: Maximal assistance   Lower Body Bathing: Total assistance   Upper Body Dressing : Maximal assistance   Lower Body Dressing: Total assistance   Toilet Transfer: Total assistance;+2 for physical assistance   Toileting- Clothing Manipulation and Hygiene: Total assistance Toileting - Clothing Manipulation Details (indicate cue type and reason): catheter  Functional mobility during ADLs: Total assistance;+2 for safety/equipment;+2 for physical assistance       Vision   Vision Assessment?: No apparent visual deficits     Perception  Perception Perception Tested?: No   Praxis Praxis Praxis tested?: Not tested    Pertinent Vitals/Pain Pain Assessment Pain Assessment: Faces Pain Score: 6  Faces Pain Scale: Hurts even more Pain Location: BLE Pain Descriptors / Indicators: Discomfort, Grimacing, Guarding Pain Intervention(s): Limited activity within patient's tolerance, Monitored during session, Repositioned     Hand Dominance Right   Extremity/Trunk Assessment Upper Extremity Assessment Upper Extremity Assessment: Generalized weakness RUE Deficits / Details: minimal shoulder ROM, elbow flex/ext WNL, 3/5 grasp LUE Deficits / Details: minimal shoulder ROM, elbow flex/ext WNL, 3/5 grasp   Lower Extremity Assessment Lower Extremity Assessment: Defer to PT evaluation   Cervical / Trunk Assessment Cervical / Trunk Assessment: Kyphotic (head forward)   Communication Communication Communication: No difficulties   Cognition Arousal/Alertness: Lethargic Behavior During Therapy: WFL for tasks assessed/performed Overall Cognitive Status: History of cognitive impairments - at baseline                                 General Comments: closing eyes frequently, nodding "yes" to most questions, able to state name, DOB, and she is at "[facility]" but unaware of month     General Comments  VSS on supplemental O2    Exercises     Shoulder Instructions      Home Living Family/patient expects to be discharged to:: Private residence Living Arrangements: Children;Other relatives Available Help at Discharge: Family;Available 24 hours/day Type of Home: House Home Access: Ramped entrance     Home Layout: One level     Bathroom Shower/Tub: Producer, television/film/video: Standard Bathroom Accessibility: Yes   Home Equipment: Agricultural consultant (2 wheels);Rollator (4 wheels);Cane - single point;Shower seat;BSC/3in1;Wheelchair - manual;Hospital bed;Other (comment) (hoyer lift)   Additional Comments:  daughter reports family will be providing 24/7 assist      Prior Functioning/Environment Prior Level of Function : Needs assist             Mobility Comments: family has been using hoyer for bed<>chair transfers since March. Daughter indicated pt had a significant functional delcine starting in 01/2023 and pt has been limited in fucntional mobility since that time with pt participating with rehab services at SNF returing to hospital then home with hospice care, hospice discontinued and [facility] PT ADLs Comments: Set up for self feeding and grooming, Mod to Max assist for UB dressing/bathing, and Total assist of +1 to +2 for all LB ADLs from daughters and aids. Non ambulatory and dependant for wc mobiltiy, intermittint use of supplemental O2 in home setting        OT Problem List: Decreased strength;Decreased activity tolerance;Impaired balance (sitting and/or standing);Decreased coordination      OT Treatment/Interventions: Self-care/ADL training;Therapeutic exercise;Patient/family education;Balance training;Therapeutic activities    OT Goals(Current goals can be found in the care plan section) Acute Rehab OT Goals Patient Stated Goal: none stated OT Goal Formulation: With patient/family Time For Goal Achievement: 07/15/23 Potential to Achieve Goals: Good ADL Goals Pt Will Perform Eating: with set-up;sitting Pt Will Perform Grooming: with set-up;sitting Pt/caregiver will Perform Home Exercise Program: Both right and left upper extremity;With Supervision;With written HEP provided Additional ADL Goal #1: pt will demonstrate good sitting balance and remain at midline to promote upright posture when performing seated ADLs  OT Frequency: Min 1X/week    Co-evaluation              AM-PAC OT "6 Clicks" Daily Activity     Outcome Measure Help from another person eating meals?: A Lot Help from another person taking care of personal grooming?: A Little Help from another person toileting,  which includes using toliet, bedpan, or urinal?: Total Help from another person bathing (including washing, rinsing, drying)?: A Lot Help from another person to put on and taking off regular upper body clothing?: A Lot Help from another person to put on and taking off regular lower body clothing?: Total 6 Click Score: 11   End of Session Nurse Communication: Mobility status  Activity Tolerance: Patient limited by lethargy;Patient limited by fatigue Patient left: in bed;with call bell/phone within reach;with bed alarm set;with family/visitor present  OT Visit Diagnosis: Muscle weakness (generalized) (M62.81)                Time: 4696-2952 OT Time Calculation (min): 15 min Charges:  OT General Charges $OT Visit: 1 Visit OT Evaluation $OT Eval Low Complexity: 1 Low  Carver Fila, OTD, OTR/L SecureChat Preferred Acute Rehab [phone]   Carver Fila Koonce 07/01/2023, 2:41 PM

## 2023-07-01 NOTE — Plan of Care (Signed)

## 2023-07-01 NOTE — Telephone Encounter (Signed)
S/w daughter, Massie Bougie per her MyChart message. Per MD, there is no further tx since liver METS, explained to daughter that herceptin is a maintenance drug and is not curative. She is currently admitted for pna/sepsis and I explained to daughter she will have an MD f/u after d/c from hospital. Massie Bougie was tearful and audibly emotional stating. "I just need to know what to tell my sisters. We don't want our mama to die."  Extended my apologies to [facility] and let her know we are here if she has any further questions or concerns and reassured we will take care of the pt.

## 2023-07-01 NOTE — Progress Notes (Signed)
[facility]                                                                               [name], is a 88 y.o. female, DOB - 27-Dec-1934, ZOX:[id number] Admit date - 06/29/2023    Outpatient Primary MD for the patient is [name], [name], MD  LOS - 2  days    Brief summary   88 year old female with past medical history of HTN, dementia, HFpEF, paroxysmal atrial fibrillation, cardiac amyloidosis, breast cancer with brain and liver mets, history of CVA, and chronic indwelling Foley catheter since last admission.  Patient recently admitted 7/5- 7/7 with encephalopathy and declining functional status.   Chest x-ray shows retrocardiac airspace opacity mild increased likely represents combination of known pleural effusion and atelectasis, some component of pneumonia cannot be excluded. Blood cultures x 2 collected, treatment started with IV Rocephin and azithromycin. Patient is  hypoxic and  placed on 2 L nasal cannula.   Assessment & Plan    Assessment and Plan:   Acute respiratory failure with hypoxia secondary to sepsis from pneumonia.   Febrile on admission,with RR 36/min,  BP 94/52 mm hg hypotensive, hypoxic with oxygen sats around 88% on RA, leukocytosis of 15,000, lactic acid wnl.  She was started on IV antibiotics empirically transition to IV ceftriaxone and IV azithromycin Currently on 2 lit/min  oxygen.  Follow up blood cultures, negative so far.  Pro calcitonin is 3.37.  COVID 19 PCR is negative.  Respiratory panel is negative.  UA negative.  CXR shows Retrocardiac airspace opacity is mildly increased and likely represents combination of known pleural effusion and atelectasis although a component of pneumonia cannot be excluded.  Acute metabolic and toxic encephalopathy  with a component of hepatic encephalopathy.  Ammonia elevated at 39. Also from pneumonia.  She is answering all questions, but remains full assist. Therapy evaluations ordered.   Recommend SNF placement, but family wants to take her home.  Repeat ammonia levels.    Left breast invasive carcinoma with underlying DCIS/ bilateral mastectomies.  With metastatic lesions to the brain and about 40 metastatic lesions in the liver evident on 06/18/23.  Unable to start the Herceptin injections due to insurance issues.  She follows up with Dr Pamelia Hoit in the office.  In view of her deconditioning , hepatic encephalopathy and new metastatic lesions, will request palliative care consulted for goals of care.    Elevated liver enzymes, alk phos, AST, ALT  - probably from liver mets.    Anemia of chronic disease Baseline hemoglobin around between 12 to 14. Slow decline in hemoglobin from 02/2023 to 10 in 03/2023 to 8.6 in July 2024.  Transfuse to keep hemoglobin greater than 7.    Hypokalemia/ hypomagnesemia: Replaced. Repeat level in am.    Hypertension:  Optimally controlled.    Dementia  Continue with namenda.   Chronic indwelling foley catheter:   Chronic HFpEF secondary to TTR amyloidosis:  She is euvolemic, will hold the lasix in view of her pneumonia.   Paroxysmal atrial fibrillation:  Rate controlled with metoprolol 50 mg BID.  Not on anti coagulation due to brain mets and increased risk of bleeding.  H/o CVA   Type 2 DM:  Diet controlled.  A1c is 6.5 % in 2 /2024.   Leukocytosis:  Persistent, suspect from infection.  Pro calcitonin is 3.37    RN Pressure Injury Documentation: Pressure Injury 03/09/23 Buttocks Right Stage 2 -  Partial thickness loss of dermis presenting as a shallow open injury with a red, pink wound bed without slough. less than 1cm size. pink/red (Active)  03/09/23 1854  Location: Buttocks  Location Orientation: Right  Staging: Stage 2 -  Partial thickness loss of dermis presenting as a shallow open injury with a red, pink wound bed without slough.  Wound Description (Comments): less than 1cm size. pink/red  Present on  Admission: Yes  Dressing Type Foam - Lift dressing to assess site every shift 06/30/23 2058     Pressure Injury 06/22/23 Heel Right Stage 3 -  Full thickness tissue loss. Subcutaneous fat may be visible but bone, tendon or muscle are NOT exposed. (Active)  06/22/23 0925  Location: Heel  Location Orientation: Right  Staging: Stage 3 -  Full thickness tissue loss. Subcutaneous fat may be visible but bone, tendon or muscle are NOT exposed.  Wound Description (Comments):   Present on Admission: Yes  Dressing Type Foam - Lift dressing to assess site every shift 06/30/23 2058     Pressure Injury 06/29/23 Sacrum Right;Left;Medial Stage 2 -  Partial thickness loss of dermis presenting as a shallow open injury with a red, pink wound bed without slough. (Active)  06/29/23 2340  Location: Sacrum  Location Orientation: Right;Left;Medial  Staging: Stage 2 -  Partial thickness loss of dermis presenting as a shallow open injury with a red, pink wound bed without slough.  Wound Description (Comments):   Present on Admission: Yes  Dressing Type Foam - Lift dressing to assess site every shift 06/30/23 2058   Wound care consulted and recommendations given.     Estimated body mass index is 24.17 kg/m as calculated from the following:   Height as of this encounter: 5' 2.99" (1.6 m).   Weight as of this encounter: 61.9 kg.  Code Status: DNR DVT Prophylaxis:  enoxaparin (LOVENOX) injection 40 mg Start: 06/30/23 1000   Level of Care: Level of care: Med-Surg Family Communication:  FAMIILY at bedside.   Disposition Plan:     Remains inpatient appropriate: IV antibiotics.   Procedures:  None.  Consultants:   Palliative care  Antimicrobials:   Anti-infectives (From admission, onward)    Start     Dose/Rate Route Frequency Ordered Stop   07/01/23 1200  cefTRIAXone (ROCEPHIN) 2 g in sodium chloride 0.9 % 100 mL IVPB        2 g 200 mL/hr over 30 Minutes Intravenous Every 24 hours 07/01/23 1043  07/05/23 1159   07/01/23 1200  azithromycin (ZITHROMAX) 500 mg in sodium chloride 0.9 % 250 mL IVPB        500 mg 250 mL/hr over 60 Minutes Intravenous Every 24 hours 07/01/23 1043 07/06/23 1159   07/01/23 1000  vancomycin (VANCOREADY) IVPB 1250 mg/250 mL  Status:  Discontinued        1,250 mg 166.7 mL/hr over 90 Minutes Intravenous Every 36 hours 06/29/23 2250 07/01/23 1043   06/30/23 0400  piperacillin-tazobactam (ZOSYN) IVPB 3.375 g  Status:  Discontinued        3.375 g 12.5 mL/hr over 240 Minutes Intravenous Every 8 hours 06/29/23 2250 07/01/23 1043   06/29/23 2045  vancomycin (VANCOCIN) IVPB 1000 mg/200 mL premix  1,000 mg 200 mL/hr over 60 Minutes Intravenous  Once 06/29/23 2042 06/29/23 2234   06/29/23 2045  piperacillin-tazobactam (ZOSYN) IVPB 3.375 g        3.375 g 100 mL/hr over 30 Minutes Intravenous  Once 06/29/23 2042 06/29/23 2128        Medications  Scheduled Meds:  Chlorhexidine Gluconate Cloth  6 each Topical Daily   enoxaparin (LOVENOX) injection  40 mg Subcutaneous Q24H   lactulose  20 g Oral BID   leptospermum manuka honey  1 Application Topical Daily   memantine  10 mg Oral BID   metoprolol tartrate  50 mg Oral BID   pneumococcal 20-valent conjugate vaccine  0.5 mL Intramuscular Tomorrow-1000   Tafamidis  61 mg Oral Daily   Continuous Infusions:  azithromycin 500 mg (07/01/23 1228)   cefTRIAXone (ROCEPHIN)  IV     PRN Meds:.acetaminophen **OR** acetaminophen, ondansetron **OR** ondansetron (ZOFRAN) IV, senna-docusate    Subjective:   Amanda Green was seen and examined today.  Alert and answering questions.  Deconditioned. 2 bm  Objective:   Vitals:   06/30/23 1015 06/30/23 1205 06/30/23 1957 07/01/23 0515  BP:  (!) 113/52 (!) 115/49 (!) 107/53  Pulse:  66 78 64  Resp:  20 16 14   Temp:  (!) 97.5 F (36.4 C) 98.3 F (36.8 C) 98.5 F (36.9 C)  TempSrc:  Oral Oral Oral  SpO2:  100% 97% 100%  Weight:      Height: 5' 2.99" (1.6 m)        Intake/Output Summary (Last 24 hours) at 07/01/2023 1254 Last data filed at 07/01/2023 0519 Gross per 24 hour  Intake 258.18 ml  Output 1050 ml  Net -791.82 ml   Filed Weights   06/29/23 1956  Weight: 61.9 kg     Exam General exam: Ill appearing lady , not in distress  Respiratory system: decreased at bases, on 2 lit of  [place] oxygen.  Cardiovascular system: S1 & S2 heard, RRR. No JVD,  Gastrointestinal system: Abdomen is nondistended, soft and nontender. Central nervous system: Alert and oriented to place and person.  Extremities: sacral decubitus ulcer and stage 3 heel ulcer.  Skin: No rashes,      Data Reviewed:  I have personally reviewed following labs and imaging studies   CBC Lab Results  Component Value Date   WBC 15.0 (H) 07/01/2023   RBC 2.98 (L) 07/01/2023   HGB 8.6 (L) 07/01/2023   HCT 26.7 (L) 07/01/2023   MCV 89.6 07/01/2023   MCH 28.9 07/01/2023   PLT 289 07/01/2023   MCHC 32.2 07/01/2023   RDW 18.9 (H) 07/01/2023   LYMPHSABS 1.2 07/01/2023   MONOABS 1.0 07/01/2023   EOSABS 0.0 07/01/2023   BASOSABS 0.0 07/01/2023     Last metabolic panel Lab Results  Component Value Date   NA 136 07/01/2023   K 3.4 (L) 07/01/2023   CL 101 07/01/2023   CO2 27 07/01/2023   BUN 11 07/01/2023   CREATININE 0.49 07/01/2023   GLUCOSE 88 07/01/2023   GFRNONAA >60 07/01/2023   GFRAA 72 02/07/2021   CALCIUM 9.2 07/01/2023   PHOS 2.2 (L) 03/19/2023   PROT 5.5 (L) 07/01/2023   ALBUMIN 2.1 (L) 07/01/2023   LABGLOB 3.4 01/03/2022   AGRATIO 1.5 02/07/2021   BILITOT 0.9 07/01/2023   ALKPHOS 370 (H) 07/01/2023   AST 120 (H) 07/01/2023   ALT 45 (H) 07/01/2023   ANIONGAP 8 07/01/2023    CBG (last 3)  No  results for input(s): "GLUCAP" in the last 72 hours.    Coagulation Profile: Recent Labs  Lab 06/30/23 0831  INR 1.3*     Radiology Studies: [facility] Chest Port 1 View  Result Date: 06/29/2023 CLINICAL DATA:  Fever, cough, hypoxia, weakness, fatigue.  Metastatic breast cancer. EXAM: PORTABLE CHEST 1 VIEW COMPARISON:  06/21/2023 FINDINGS: Retrocardiac airspace opacity is mildly increased and likely represents combination of known pleural effusion and atelectasis although a component of pneumonia cannot be excluded. Atherosclerotic calcification of the aortic arch. Right axillary lymph node clips. Bony demineralization. The right lung appears clear. IMPRESSION: 1. Retrocardiac airspace opacity is mildly increased and likely represents combination of known pleural effusion and atelectasis although a component of pneumonia cannot be excluded. 2.  Aortic Atherosclerosis (ICD10-I70.0). Electronically Signed   By: Gaylyn Rong M.D.   On: 06/29/2023 20:39       Kathlen Mody M.D. [facility] 07/01/2023, 12:54 PM  Available via Epic secure chat 7am-7pm After 7 pm, please refer to night coverage provider listed on amion.

## 2023-07-01 NOTE — Evaluation (Signed)
Physical Therapy Evaluation Patient Details Name: Amanda Green MRN: [id number] DOB: July 26, 1935 Today's Date: 07/01/2023  History of Present Illness  88 yo female presents to therapy s/p hospital admission on 06/29/2023 due to progressive weakness, AMS, cough and congestion. Pt recently 06/23/2023 d/c from acute hospital secondary to encephalopathy. CXR reveals retrocardiac airspace opacity, pleural effusion and atelectasis.Pt currently being treated with IV ABX and supplemental O2 due to acute respiratory failure with hypoxia secondary to sepsis associated with HCAP. Pt PMH includes but is not limited to: metastatic Ba Ca B mastectomy, CVA, DM II, PAF, HDL, HTN, memory loss, CVA, paresthesia, TKA and R heel stage III pressure wound and stage II sacral pressure wound.  Clinical Impression    Pt admitted with above diagnosis.  Pt currently with functional limitations due to the deficits listed below (see PT Problem List). Pt in bed when PT arrived. Daughter present. Pt lethargic with minimal verbal communication and daughter able to provided insight to PLOF. Pt exhibits a significant functional decline from 01/2023 and requires 24/7 family S and A as well as mechanical lift for transfer tasks. Pt required encouragement to engage with functional mobility tasks. Pt on 2 L/min when PT arrived, daughter indicated supplemental O2 at home PRN. O2 saturation at rest on 2 L/min 98% and 69 PR, 1 L/min s/p seated EOB 97% and 71 PR. Pt required max A for supine to sit with HOB elevated, mod A for initial sitting balance with PT positioning B LE and L UE pt progressing to min A for sitting balance with unsupported sitting tolerance of 4 mins. Pt required total assist for sit to supine and repositioning in bed with pt positioned on L side with use of pillows as props and B heels floated off bed. Pt left in bed, all needs met and daughter present. Pt will benefit from acute skilled PT to increase their independence and  safety with mobility to allow discharge.         Assistance Recommended at Discharge Frequent or constant Supervision/Assistance  If plan is discharge home, recommend the following:  Can travel by private vehicle  Two people to help with walking and/or transfers;Assistance with cooking/housework;Two people to help with bathing/dressing/bathroom;Assistance with feeding;Direct supervision/assist for medications management;Help with stairs or ramp for entrance;Assist for transportation;Direct supervision/assist for financial management        Equipment Recommendations None recommended by PT (DME in home setting)  Recommendations for Other Services       Functional Status Assessment Patient has had a recent decline in their functional status and demonstrates the ability to make significant improvements in function in a reasonable and predictable amount of time.     Precautions / Restrictions Precautions Precautions: Fall Restrictions Weight Bearing Restrictions: No      Mobility  Bed Mobility Overal bed mobility: Needs Assistance Bed Mobility: Sit to Supine, Supine to Sit     Supine to sit: Max assist, HOB elevated Sit to supine: Total assist   General bed mobility comments: minimal enagagement and voluntary movement to participate with bed mobilty, encouragement and cues from PT and daughter    Transfers                   General transfer comment: NT Pt requires Hoyer lift for functional transfers at baseline.    Ambulation/Gait               General Gait Details: NT  Stairs  Wheelchair Mobility     Tilt Bed    Modified Rankin (Stroke Patients Only)       Balance Overall balance assessment: Needs assistance Sitting-balance support: Bilateral upper extremity supported, Feet supported Sitting balance-Leahy Scale: Poor Sitting balance - Comments: head forward and trunk flexion with pt progressing from mod A to maintain sitting  balance to min A, cues c/o back pain and pt tolerated assisted sitting EOB for 4 mins with slight L lateral lean                                     Pertinent Vitals/Pain Pain Assessment Pain Assessment: Faces Faces Pain Scale: Hurts even more Pain Location: R knee and back (Pt and daughter report this is baseline.) Pain Descriptors / Indicators: Discomfort, Grimacing, Guarding Pain Intervention(s): Limited activity within patient's tolerance, Monitored during session    Home Living Family/patient expects to be discharged to:: Private residence Living Arrangements: Children;Other relatives Available Help at Discharge: Family;Available 24 hours/day Type of Home: House Home Access: Ramped entrance       Home Layout: One level Home Equipment: Agricultural consultant (2 wheels);Rollator (4 wheels);Cane - single point;Shower seat;BSC/3in1;Wheelchair - manual;Hospital bed;Other (comment) (mechanical lift/total lift) Additional Comments: daughter reports family will be providing 24/7 assist    Prior Function Prior Level of Function : Needs assist             Mobility Comments: family has been using hoyer for bed<>chair transfers since March. Daughter indicated pt had a significant functional delcine starting in 01/2023 and pt has been limited in fucntional mobility since that time with pt participating with rehab services at SNF returing to hospital then home with hospice care, hospice discontinued and [facility] PT ADLs Comments: Set up for self feeding and grooming, Mod to Max assist for UB dressing/bathing, and Total assist of +1 to +2 for all LB ADLs from daughters and aids. Non ambulatory and dependant for wc mobiltiy, intermittint use of supplemental O2 in home setting     Hand Dominance   Dominant Hand: Right    Extremity/Trunk Assessment   Upper Extremity Assessment RUE Deficits / Details: pt indicated L shoulder discomfort when in sitting and use of support ROM limtations     Lower Extremity Assessment Lower Extremity Assessment: Generalized weakness (R knee pain)    Cervical / Trunk Assessment Cervical / Trunk Assessment: Kyphotic (head forward)  Communication   Communication: No difficulties  Cognition Arousal/Alertness: Lethargic Behavior During Therapy: WFL for tasks assessed/performed Overall Cognitive Status: History of cognitive impairments - at baseline                                 General Comments: cues for attention and to engage with functional mobility, pt is able to follow one step commands        General Comments      Exercises     Assessment/Plan    PT Assessment Patient needs continued PT services  PT Problem List Decreased strength;Decreased activity tolerance;Decreased range of motion;Decreased balance;Decreased mobility;Decreased cognition;Decreased knowledge of use of DME;Decreased safety awareness;Decreased knowledge of precautions;Obesity       PT Treatment Interventions DME instruction;Gait training;Functional mobility training;Therapeutic activities;Therapeutic exercise;Balance training;Neuromuscular re-education;Patient/family education;Wheelchair mobility training    PT Goals (Current goals can be found in the Care Plan section)  Acute Rehab PT Goals Patient Stated  Goal: to be able to attend daughters 37 birthday party in the mtns PT Goal Formulation: With patient/family Time For Goal Achievement: 07/15/23 Potential to Achieve Goals: Fair    Frequency Min 1X/week     Co-evaluation               AM-PAC PT "6 Clicks" Mobility  Outcome Measure Help needed turning from your back to your side while in a flat bed without using bedrails?: Total Help needed moving from lying on your back to sitting on the side of a flat bed without using bedrails?: Total Help needed moving to and from a bed to a chair (including a wheelchair)?: Total Help needed standing up from a chair using your arms (e.g.,  wheelchair or bedside chair)?: Total Help needed to walk in hospital room?: Total Help needed climbing 3-5 steps with a railing? : Total 6 Click Score: 6    End of Session   Activity Tolerance: Patient limited by fatigue;Patient limited by pain Patient left: in bed;with call bell/phone within reach;with family/visitor present Nurse Communication: Mobility status;Need for lift equipment PT Visit Diagnosis: Other abnormalities of gait and mobility (R26.89);Muscle weakness (generalized) (M62.81);Difficulty in walking, not elsewhere classified (R26.2);Pain    Time: 8101-7510 PT Time Calculation (min) (ACUTE ONLY): 23 min   Charges:   PT Evaluation $PT Eval Low Complexity: 1 Low PT Treatments $Therapeutic Activity: 8-22 mins PT General Charges $$ ACUTE PT VISIT: 1 Visit        Johnny Bridge, PT Acute Rehab   Jacqualyn Posey 07/01/2023, 11:59 AM

## 2023-07-01 NOTE — Progress Notes (Signed)
Initial Nutrition Assessment  DOCUMENTATION CODES:   Severe malnutrition in context of chronic illness  INTERVENTION:  Strawberry Ensure Plus High Protein po daily, each supplement provides 350 kcal and 20 grams of protein.  -1 packet Juven daily, each packet provides 95 calories, 2.5 grams of protein (collagen), and 9.8 grams of carbohydrate (3 grams sugar); also contains 7 grams of L-arginine and L-glutamine, 300 mg vitamin C, 15 mg vitamin E, 1.2 mcg vitamin B-12, 9.5 mg zinc, 200 mg calcium, and 1.5 g  Calcium Beta-hydroxy-Beta-methylbutyrate to support wound healing  Encourage po intake   NUTRITION DIAGNOSIS:   Severe Malnutrition related to cancer and cancer related treatments, chronic illness as evidenced by severe muscle depletion, percent weight loss.   GOAL:   Patient will meet greater than or equal to 90% of their needs   MONITOR:   PO intake, Supplement acceptance, Skin, I & O's, Labs  REASON FOR ASSESSMENT:   Consult Assessment of nutrition requirement/status  ASSESSMENT:   88 y.o. female with PMHx including DTN, dementia, HFpEF, PAF, cardiac amyloidosis, breast cancer with brain and liver mets, hx of CVA, chronic foley catheter presents with confusion and SOB. Patient admitted for HCAP.  Per MD notes:  -Recent admission 7/5-7/7 due to encephalopathy and functional decline. She was discharged home with her daughter and did well the first few days before developing cough, congestion and confusion  -No further treatment for cancer due to liver mets    Visited patient at bedside whose daughter was present to give hx. Patient was awake and lying comfortably in bed. She has been tolerating her diet here in the hospital and has been doing well at breakfast time. Patient has been vomiting food and phlegm recently. Her daughter is unsure whether r it is reflux or if patient is swallowing phlegm and then gagging on it.   She normally eats breakfast and dinner at home.  She eats a snack sometimes as well. Patient's daughter reports she drinks about 5 cups of water per day.   Patient's daughter endorses weight loss over 3 months and reports that is when her health began to decline.   RD discussed ONS appropriate for patient's wounds and weight loss  Labs: K+ 3.4, alk phos 370, AST 120 Meds: zithromax, rocephin, chronulac, medihoney, zosyn, vancoready  Wt: 10.2 kg (14%) wt loss x 3 months ago 06/29/23 61.9 kg  06/25/23 64.5 kg  06/22/23 66.7 kg  06/19/23 60.8 kg  06/11/23 61 kg  03/30/23 72 kg  03/25/23 72.1 kg   PO: no meals documented at this time  I/O's: -1 L since admission    NUTRITION - FOCUSED PHYSICAL EXAM:  Flowsheet Row Most Recent Value  Orbital Region No depletion  Upper Arm Region Mild depletion  Thoracic and Lumbar Region Unable to assess  Buccal Region Moderate depletion  Temple Region Mild depletion  Clavicle Bone Region No depletion  Clavicle and Acromion Bone Region No depletion  Dorsal Hand Moderate depletion  Patellar Region Severe depletion  Anterior Thigh Region Severe depletion  Posterior Calf Region Severe depletion  Edema (RD Assessment) None  Hair Reviewed  Eyes Unable to assess  Mouth Unable to assess  Skin Reviewed  Nails Reviewed       Diet Order:   Diet Order             Diet Heart Room service appropriate? Yes; Fluid consistency: Thin  Diet effective now  EDUCATION NEEDS:   Education needs have been addressed  Skin:  Skin Assessment: Skin Integrity Issues: Skin Integrity Issues:: Stage II, Stage III Stage II: sacrum, R buttock Stage III: R heel  Last BM:  7/15 typr 6  Height:   Ht Readings from Last 1 Encounters:  06/30/23 5' 2.99" (1.6 m)    Weight:   Wt Readings from Last 1 Encounters:  06/29/23 61.9 kg    Ideal Body Weight:     BMI:  Body mass index is 24.17 kg/m.  Estimated Nutritional Needs:   Kcal:  1600-1860  Protein:  75-95  Fluid:  >  1.8 L    Leodis Rains, RDN, LDN  Clinical Nutrition

## 2023-07-02 ENCOUNTER — Encounter: Payer: Self-pay | Admitting: Hematology and Oncology

## 2023-07-02 ENCOUNTER — Other Ambulatory Visit: Payer: Self-pay | Admitting: Adult Health

## 2023-07-02 DIAGNOSIS — Z7189 Other specified counseling: Secondary | ICD-10-CM

## 2023-07-02 DIAGNOSIS — Z8673 Personal history of transient ischemic attack (TIA), and cerebral infarction without residual deficits: Secondary | ICD-10-CM

## 2023-07-02 DIAGNOSIS — C50919 Malignant neoplasm of unspecified site of unspecified female breast: Secondary | ICD-10-CM

## 2023-07-02 DIAGNOSIS — A419 Sepsis, unspecified organism: Secondary | ICD-10-CM | POA: Diagnosis not present

## 2023-07-02 DIAGNOSIS — E854 Organ-limited amyloidosis: Secondary | ICD-10-CM | POA: Diagnosis not present

## 2023-07-02 DIAGNOSIS — E43 Unspecified severe protein-calorie malnutrition: Secondary | ICD-10-CM

## 2023-07-02 DIAGNOSIS — F0390 Unspecified dementia without behavioral disturbance: Secondary | ICD-10-CM

## 2023-07-02 DIAGNOSIS — I43 Cardiomyopathy in diseases classified elsewhere: Secondary | ICD-10-CM

## 2023-07-02 DIAGNOSIS — J189 Pneumonia, unspecified organism: Secondary | ICD-10-CM | POA: Diagnosis not present

## 2023-07-02 DIAGNOSIS — Z515 Encounter for palliative care: Secondary | ICD-10-CM | POA: Diagnosis not present

## 2023-07-02 DIAGNOSIS — R4589 Other symptoms and signs involving emotional state: Secondary | ICD-10-CM

## 2023-07-02 DIAGNOSIS — C7931 Secondary malignant neoplasm of brain: Secondary | ICD-10-CM

## 2023-07-02 LAB — COMPREHENSIVE METABOLIC PANEL
ALT: 43 U/L (ref 0–44)
AST: 91 U/L — ABNORMAL HIGH (ref 15–41)
Albumin: 2.1 g/dL — ABNORMAL LOW (ref 3.5–5.0)
Alkaline Phosphatase: 383 U/L — ABNORMAL HIGH (ref 38–126)
Anion gap: 7 (ref 5–15)
BUN: 13 mg/dL (ref 8–23)
CO2: 24 mmol/L (ref 22–32)
Calcium: 9.3 mg/dL (ref 8.9–10.3)
Chloride: 104 mmol/L (ref 98–111)
Creatinine, Ser: 0.53 mg/dL (ref 0.44–1.00)
GFR, Estimated: >60 mL/min (ref >60)
Glucose, Bld: 105 mg/dL — ABNORMAL HIGH (ref 70–99)
Potassium: 4 mmol/L (ref 3.5–5.1)
Sodium: 135 mmol/L (ref 135–145)
Total Bilirubin: 0.7 mg/dL (ref 0.3–1.2)
Total Protein: 5.4 g/dL — ABNORMAL LOW (ref 6.5–8.1)

## 2023-07-02 LAB — CBC WITH DIFFERENTIAL/PLATELET
Abs Immature Granulocytes: 0.15 10*3/uL — ABNORMAL HIGH (ref 0.00–0.07)
Basophils Absolute: 0.1 10*3/uL (ref 0.0–0.1)
Basophils Relative: 1 %
Eosinophils Absolute: 0.3 10*3/uL (ref 0.0–0.5)
Eosinophils Relative: 3 %
HCT: 26.7 % — ABNORMAL LOW (ref 36.0–46.0)
Hemoglobin: 8.6 g/dL — ABNORMAL LOW (ref 12.0–15.0)
Immature Granulocytes: 1 %
Lymphocytes Relative: 10 %
Lymphs Abs: 1 10*3/uL (ref 0.7–4.0)
MCH: 29.2 pg (ref 26.0–34.0)
MCHC: 32.2 g/dL (ref 30.0–36.0)
MCV: 90.5 fL (ref 80.0–100.0)
Monocytes Absolute: 0.8 10*3/uL (ref 0.1–1.0)
Monocytes Relative: 8 %
Neutro Abs: 8.5 10*3/uL — ABNORMAL HIGH (ref 1.7–7.7)
Neutrophils Relative %: 77 %
Platelets: 318 10*3/uL (ref 150–400)
RBC: 2.95 MIL/uL — ABNORMAL LOW (ref 3.87–5.11)
RDW: 19.3 % — ABNORMAL HIGH (ref 11.5–15.5)
WBC: 10.8 10*3/uL — ABNORMAL HIGH (ref 4.0–10.5)
nRBC: 0 % (ref 0.0–0.2)

## 2023-07-02 LAB — AMMONIA: Ammonia: 39 umol/L — ABNORMAL HIGH (ref 9–35)

## 2023-07-02 MED ORDER — MUSCLE RUB 10-15 % EX CREA
1.0000 | TOPICAL_CREAM | CUTANEOUS | Status: DC | PRN
  Administered 2023-07-02 – 2023-07-03 (×2): 1 via TOPICAL
  Filled 2023-07-02: qty 85

## 2023-07-02 NOTE — Consult Note (Signed)
Consultation Note Date: 07/02/2023   Patient Name: Amanda Green  DOB: 1935/03/10  MRN: [id number]  Age / Sex: 88 y.o., female   PCP: Ollen Bowl, MD Referring Physician: Kathlen Mody, MD  Reason for Consultation: Establishing goals of care     Chief Complaint/History of Present Illness:   Patient is an 88 year old female with a past medical history of hypertension, dementia, HFpEF, paroxysmal atrial fibrillation, cardiac amyloidosis, history of CVA, chronic indwelling Foley catheter, and breast cancer with metastatic disease to brain and liver who was admitted on 06/29/2023 for management of encephalopathy and declining functional status.  Since being admitted, patient has received medical management for acute respiratory failure secondary to sepsis from pneumonia and management of encephalopathy.  Palliative medicine team consulted to assist with complex medical decision making.  Extensive review of EMR prior to presenting to bedside. Of note patient seen by outpatient palliative medicine group [facility] on 05/21/2023.  That note discussed how patient did not want to return to the hospital and consideration of hospice at that time.   After this visit though, patient followed up with oncologist who recommended follow-up with radiation oncology for radiosurgery.  Patient has been accepted for radiosurgery of new brain mets.  Patient continues to be offered Herceptin as a maintenance therapy for breast cancer.  Patient and family have continued to pursue this with outpatient oncologist, Dr. Pamelia Hoit.  Presented to bedside to meet with patient.  When presenting to bedside, patient laying comfortably in the bed.  Asked permission to engage in conversation and patient agreeing.  Patient able to introduce her daughter, [name], at bedside.  Introduced myself as a member of the palliative medicine team and our role in patient's care.  Patient easily able to  engage in conversation.  Able to review patient's medical journey at this time.   Patient denied any symptoms of concern at this time.  Noted last bowel movement was this morning so no concerns of constipation. Patient and daughter noted they had heard from hospitalist that plan is for patient to discharge tomorrow.  They are both looking forward to patient returning home with home health support.  Daughter does remember [facility] palliative medicine group being previously involved in patient's care after home visit in June.  Noted would reach out to [facility] liaison to assist with making sure there is continue to follow-up in the outpatient setting.  At this time their goal is to continue to follow-up with Dr. Pamelia Hoit regarding further cancer directed therapies that are being offered.  All questions answered at that time.  Thanked patient and family for allowing me to visit with them today.  Noted palliative medicine team will continue to follow along with patient's medical journey.  Primary Diagnoses  Present on Admission:  HCAP (healthcare-associated pneumonia)  Essential hypertension  Cardiac amyloidosis (HCC)  Breast cancer metastasized to brain [facility])   Palliative Review of Systems: No symptoms of concern   Past Medical History:  Diagnosis Date   Arthritis    knees,   Breast cancer (HCC) 1999   right mastectomy   Breast cancer, left (HCC) 05/2019   left lumpectomy   Cerebrovascular accident (CVA) (HCC) 02/07/2021   Diabetes mellitus    Dislocation of left shoulder joint    Family history of prostate cancer    Hyperlipidemia    Hypertension    Memory loss    Numbness and tingling of right lower extremity    Paresthesia  Stroke [facility])    light stroke - 2012 ,right leg nerve damage    Social History   Socioeconomic History   Marital status: Widowed    Spouse name: Not on file   Number of children: 5   Years of education: HS - 12 years   Highest education  level: Not on file  Occupational History   Occupation: Retired  Tobacco Use   Smoking status: Never   Smokeless tobacco: Never  Vaping Use   Vaping status: Never Used  Substance and Sexual Activity   Alcohol use: No   Drug use: No   Sexual activity: Not on file  Other Topics Concern   Not on file  Social History Narrative   Lives at home alone.   Right-handed.   1 cup caffeine daily.   Social Determinants of Health   Financial Resource Strain: Not on file  Food Insecurity: No Food Insecurity (06/29/2023)   Hunger Vital Sign    Worried About Running Out of Food in the Last Year: Never true    Ran Out of Food in the Last Year: Never true  Transportation Needs: No Transportation Needs (06/29/2023)   PRAPARE - Administrator, Civil Service (Medical): No    Lack of Transportation (Non-Medical): No  Physical Activity: Not on file  Stress: Not on file  Social Connections: Not on file   Family History  Problem Relation Age of Onset   Esophageal cancer Mother        smoker   Other Father        Unsure of medical history.   Prostate cancer Brother        d. 19s   Cancer Son        ?? unsure, but may have had cancer   Cancer Son        ?? unsure, but may have had cancer   Scheduled Meds:  Chlorhexidine Gluconate Cloth  6 each Topical Daily   enoxaparin (LOVENOX) injection  40 mg Subcutaneous Q24H   feeding supplement  237 mL Oral Q24H   lactulose  20 g Oral BID   leptospermum manuka honey  1 Application Topical Daily   memantine  10 mg Oral BID   metoprolol tartrate  50 mg Oral BID   nutrition supplement (JUVEN)  1 packet Oral BID BM   pneumococcal 20-valent conjugate vaccine  0.5 mL Intramuscular Tomorrow-1000   Tafamidis  61 mg Oral Daily   Continuous Infusions:  azithromycin Stopped (07/01/23 1328)   cefTRIAXone (ROCEPHIN)  IV Stopped (07/01/23 1417)   PRN Meds:.acetaminophen **OR** acetaminophen, ondansetron **OR** ondansetron (ZOFRAN) IV,  senna-docusate Allergies  Allergen Reactions   Neurontin [Gabapentin] Other (See Comments)    Drowsiness   CBC:    Component Value Date/Time   WBC 10.8 (H) 07/02/2023 0514   HGB 8.6 (L) 07/02/2023 0514   HGB 10.0 (L) 06/25/2023 1312   HGB 12.3 02/07/2021 1050   HCT 26.7 (L) 07/02/2023 0514   HCT 38.0 02/07/2021 1050   PLT 318 07/02/2023 0514   PLT 320 06/25/2023 1312   MCV 90.5 07/02/2023 0514   MCV 88 02/07/2021 1050   NEUTROABS 8.5 (H) 07/02/2023 0514   NEUTROABS 4.4 02/07/2021 1050   LYMPHSABS 1.0 07/02/2023 0514   LYMPHSABS 1.3 02/07/2021 1050   MONOABS 0.8 07/02/2023 0514   EOSABS 0.3 07/02/2023 0514   EOSABS 0.3 02/07/2021 1050   BASOSABS 0.1 07/02/2023 0514   BASOSABS 0.1 02/07/2021 1050   Comprehensive Metabolic  Panel:    Component Value Date/Time   NA 135 07/02/2023 0514   NA 139 02/07/2021 1050   K 4.0 07/02/2023 0514   CL 104 07/02/2023 0514   CO2 24 07/02/2023 0514   BUN 13 07/02/2023 0514   BUN 17 02/07/2021 1050   CREATININE 0.53 07/02/2023 0514   CREATININE 0.50 06/25/2023 1312   GLUCOSE 105 (H) 07/02/2023 0514   CALCIUM 9.3 07/02/2023 0514   AST 91 (H) 07/02/2023 0514   AST 110 (H) 06/25/2023 1312   ALT 43 07/02/2023 0514   ALT 39 06/25/2023 1312   ALKPHOS 383 (H) 07/02/2023 0514   BILITOT 0.7 07/02/2023 0514   BILITOT 0.4 06/25/2023 1312   PROT 5.4 (L) 07/02/2023 0514   PROT 6.7 01/03/2022 1527   ALBUMIN 2.1 (L) 07/02/2023 0514   ALBUMIN 3.9 02/07/2021 1050    Physical Exam: Vital Signs: BP (!) 109/49 (BP Location: Left Arm)   Pulse 66   Temp 98.6 F (37 C) (Axillary)   Resp 16   Ht 5' 2.99" (1.6 m)   Wt 61.9 kg   SpO2 98%   BMI 24.17 kg/m  SpO2: SpO2: 98 % O2 Device: O2 Device: Room Air O2 Flow Rate: O2 Flow Rate (L/min): 2 L/min Intake/output summary:  Intake/Output Summary (Last 24 hours) at 07/02/2023 1004 Last data filed at 07/02/2023 1191 Gross per 24 hour  Intake 570 ml  Output 550 ml  Net 20 ml   LBM: Last BM Date :  07/01/23 Baseline Weight: Weight: 61.9 kg Most recent weight: Weight: 61.9 kg  General: NAD, alert, chronically ill appearing, laying in bed  Eyes: cono drainage noted HENT: moist mucous membranes Cardiovascular: RRR, Respiratory: no increased work of breathing noted, not in respiratory distress Abdomen: not distended Neuro: alter, interactive Psych: appropriately answers all questions          Palliative Performance Scale: 30%              Additional Data Reviewed: Recent Labs    07/01/23 0525 07/02/23 0514  WBC 15.0* 10.8*  HGB 8.6* 8.6*  PLT 289 318  NA 136 135  BUN 11 13  CREATININE 0.49 0.53    Imaging: DG Chest Port 1 View CLINICAL DATA:  Fever, cough, hypoxia, weakness, fatigue. Metastatic breast cancer.  EXAM: PORTABLE CHEST 1 VIEW  COMPARISON:  06/21/2023  FINDINGS: Retrocardiac airspace opacity is mildly increased and likely represents combination of known pleural effusion and atelectasis although a component of pneumonia cannot be excluded.  Atherosclerotic calcification of the aortic arch. Right axillary lymph node clips. Bony demineralization.  The right lung appears clear.  IMPRESSION: 1. Retrocardiac airspace opacity is mildly increased and likely represents combination of known pleural effusion and atelectasis although a component of pneumonia cannot be excluded. 2.  Aortic Atherosclerosis (ICD10-I70.0).  Electronically Signed   By: Gaylyn Rong M.D.   On: 06/29/2023 20:39    I personally reviewed recent imaging.   Palliative Care Assessment and Plan Summary of Established Goals of Care and Medical Treatment Preferences   Patient is an 88 year old female with a past medical history of hypertension, dementia, HFpEF, paroxysmal atrial fibrillation, cardiac amyloidosis, history of CVA, chronic indwelling Foley catheter, and breast cancer with metastatic disease to brain and liver who was admitted on 06/29/2023 for management of  encephalopathy and declining functional status.  Since being admitted, patient has received medical management for acute respiratory failure secondary to sepsis from pneumonia and management of encephalopathy.  Palliative medicine team  consulted to assist with complex medical decision making.  # Complex medical decision making/goals of care  -Discussed care with patient and her daughter, [name], as described in detail above in HPI. Plan is to continue follow up with Dr. Pamelia Hoit, oncologist, in the outpatient setting regarding further breast cancer cancer directed therapies, including Herceptin as maintenance therapy. Patient and family already agreed to follow up with [facility] regarding radiosurgery for brains meds as well.  Hoping to return home with home health soon.   -Patient already followed by home palliative medicine group, [facility]. Spoke with their liaison and their group plans to continue following patient in the outpatient setting to continue conversations as able moving forward.   -  Code Status: DNR   # Symptom management  -No symptoms of concern voice by patient  # Psycho-social/Spiritual Support:  - Support System: daughters  # Discharge Planning:  Home with Palliative Services via [facility] (already established) and home health   Thank you for allowing the palliative care team to participate in the care [facility].  Alvester Morin, DO Palliative Care Provider PMT # [phone]  If patient remains symptomatic despite maximum doses, please call PMT at [phone] between 0700 and 1900. Outside of these hours, please call attending, as PMT does not have night coverage.  This provider spent a total of 80 minutes providing patient's care.  Includes review of EMR, discussing care with other staff members involved in patient's medical care, obtaining relevant history and information from patient and/or patient's family, and personal review of imaging and lab work.  Greater than 50% of the time was spent counseling and coordinating care related to the above assessment and plan.    *Please note that this is a verbal dictation therefore any spelling or grammatical errors are due to the "[employer] One" system interpretation.

## 2023-07-02 NOTE — TOC Progression Note (Signed)
Transition of Care [facility]) - Progression Note    Patient Details  Name: Tabrina Esty MRN: [id number] Date of Birth: [birth year]  Transition of Care [facility]) CM/SW Contact  Beckie Busing, RN Phone Number:[phone]  07/02/2023, 12:30 PM  Clinical Narrative:    CM at bedside to offer choice for [facility] services. Patients cousin is currently at bedside and states that daughter has stepped away and will be back. CM has left list and phone number for daughter to contact her once daughter returns to the room.   Expected Discharge Plan: Home w Home Health Services Barriers to Discharge: Continued Medical Work up  Expected Discharge Plan and Services   Discharge Planning Services: Follow-up appt scheduled Post Acute Care Choice: Home Health Living arrangements for the past 2 months: Single Family Home                 DME Arranged:  (has DME)                     Social Determinants of Health (SDOH) Interventions SDOH Screenings   Food Insecurity: No Food Insecurity (06/29/2023)  Housing: Low Risk  (06/29/2023)  Transportation Needs: No Transportation Needs (06/29/2023)  Utilities: Not At Risk (06/29/2023)  Tobacco Use: Low Risk  (06/30/2023)    Readmission Risk Interventions    07/02/2023   12:00 PM 07/01/2023   12:14 PM 07/01/2023   12:11 PM  Readmission Risk Prevention Plan  Transportation Screening Complete    Medication Review (RN Care Manager) Referral to Pharmacy  Complete  PCP or Specialist appointment within 3-5 days of discharge Complete Not Complete   PCP/Specialist Appt Not Complete comments patient does have PCP and will follow up appt schediled with specialists   HRI or Home Care Consult Complete    HRI or Home Care Consult Pt Refusal Comments list provided for daughter    SW Recovery Care/Counseling Consult Complete Not Complete   Palliative Care Screening Complete    Skilled Nursing Facility Patient Refused Not Complete

## 2023-07-02 NOTE — TOC Initial Note (Signed)
Transition of Care [facility]) - Initial/Assessment Note    Patient Details  Name: Amanda Green MRN: [id number] Date of Birth: 09-Jul-1935  Transition of Care [facility]) CM/SW Contact:    Harriett Sine, RN Phone Number: 07/02/2023, 1:08 PM  Clinical Narrative:                   Expected Discharge Plan: Home w Home Health Services Barriers to Discharge: Continued Medical Work up   Patient Goals and CMS Choice   CMS Medicare.gov Compare Post Acute Care list provided to:: Patient Represenative (must comment) Choice offered to / list presented to : Patient, Adult Children      Expected Discharge Plan and Services   Discharge Planning Services: Follow-up appt scheduled Post Acute Care Choice: Home Health Living arrangements for the past 2 months: Single Family Home                 DME Arranged:  (has DME)                    Prior Living Arrangements/Services Living arrangements for the past 2 months: Single Family Home Lives with:: Adult Children Patient language and need for interpreter reviewed:: No Do you feel safe going back to the place where you live?: Yes      Need for Family Participation in Patient Care: Yes (Comment) Care giver support system in place?: Yes (comment) (Adult childeren)   Criminal Activity/Legal Involvement Pertinent to Current Situation/Hospitalization: No - Comment as needed  Activities of Daily Living Home Assistive Devices/Equipment: None ADL Screening (condition at time of admission) Patient's cognitive ability adequate to safely complete daily activities?: No Is the patient deaf or have difficulty hearing?: No Does the patient have difficulty seeing, even when wearing glasses/contacts?: No Does the patient have difficulty concentrating, remembering, or making decisions?: Yes Patient able to express need for assistance with ADLs?: Yes Does the patient have difficulty dressing or bathing?: Yes Independently performs ADLs?: No Does the  patient have difficulty walking or climbing stairs?: Yes Weakness of Legs: Both Weakness of Arms/Hands: Both  Permission Sought/Granted Permission sought to share information with :  [facility])                Emotional Assessment Appearance:: Appears stated age     Orientation: : Oriented to Place, Oriented to Self   Psych Involvement: No (comment)  Admission diagnosis:  HCAP (healthcare-associated pneumonia) [J18.9] Sepsis, due to unspecified organism, unspecified whether acute organ dysfunction present [facility]) [A41.9] Patient Active Problem List   Diagnosis Date Noted   Protein-calorie malnutrition, severe 07/02/2023   Counseling and coordination of care 07/02/2023   Goals of care, counseling/discussion 07/02/2023   Palliative care encounter 07/02/2023   Need for emotional support 07/02/2023   HCAP (healthcare-associated pneumonia) 06/29/2023   Elevated LFTs 06/29/2023   Dementia without behavioral disturbance (HCC) 06/29/2023   Atrial fibrillation, chronic (HCC) 06/29/2023   T2DM (type 2 diabetes mellitus) (HCC) 06/29/2023   (HFpEF) heart failure with preserved ejection fraction (HCC) 06/29/2023   HLD (hyperlipidemia) 06/29/2023   Acute metabolic encephalopathy 06/21/2023   Neoplasm of brain causing mass effect on adjacent structures (HCC) 02/14/2023   Cardiac amyloidosis (HCC) 02/14/2023   Spinal stenosis of lumbar region with neurogenic claudication 03/15/2022   Gait abnormality 11/13/2021   Low back pain without sciatica 11/13/2021   Ductal carcinoma in situ (DCIS) of breast 05/10/2021   Paroxysmal atrial fibrillation (HCC) 04/14/2021   Essential hypertension 04/14/2021   Controlled  type 2 diabetes mellitus without complication, without long-term current use of insulin (HCC) 04/14/2021   Cognitive impairment 02/07/2021   Genetic testing 09/25/2019   Breast cancer metastasized to brain [facility]) 06/10/2019   Paresthesia of right lower extremity 01/28/2017   History of CVA  (cerebrovascular accident) 01/28/2017   Colon polyps 08/15/2016   Chronic right shoulder pain 11/24/2015   Right mastectomy 1999 03/26/2012   PCP:  Ollen Bowl, MD Pharmacy:   [facility] [address] [place] - [address] [place] [address] Phone: [phone] Fax: [phone]     Social Determinants of Health (SDOH) Social History: SDOH Screenings   Food Insecurity: No Food Insecurity (06/29/2023)  Housing: Low Risk  (06/29/2023)  Transportation Needs: No Transportation Needs (06/29/2023)  Utilities: Not At Risk (06/29/2023)  Tobacco Use: Low Risk  (06/30/2023)   SDOH Interventions:     Readmission Risk Interventions    07/02/2023   12:00 PM 07/01/2023   12:14 PM 07/01/2023   12:11 PM  Readmission Risk Prevention Plan  Transportation Screening Complete    Medication Review (RN Care Manager) Referral to Pharmacy  Complete  PCP or Specialist appointment within 3-5 days of discharge Complete Not Complete   PCP/Specialist Appt Not Complete comments patient does have PCP and will follow up appt schediled with specialists   HRI or Home Care Consult Complete    HRI or Home Care Consult Pt Refusal Comments list provided for daughter    SW Recovery Care/Counseling Consult Complete Not Complete   Palliative Care Screening Complete    Skilled Nursing Facility Patient Refused Not Complete

## 2023-07-02 NOTE — Progress Notes (Signed)
[facility]                                                                               Amanda Green, is a 88 y.o. female, DOB - 03-27-1935, WGN:[id number] Admit date - 06/29/2023    Outpatient Primary MD for the patient is [name], [name], MD  LOS - 3  days    Brief summary   88 year old female with past medical history of HTN, dementia, HFpEF, paroxysmal atrial fibrillation, cardiac amyloidosis, breast cancer with brain and liver mets, history of CVA, and chronic indwelling Foley catheter since last admission.  Patient recently admitted 7/5- 7/7 with encephalopathy and declining functional status.   Chest x-ray shows retrocardiac airspace opacity mild increased likely represents combination of known pleural effusion and atelectasis, some component of pneumonia cannot be excluded. Blood cultures x 2 collected, treatment started with IV Rocephin and azithromycin. Patient is  hypoxic and  placed on 2 L nasal cannula.  Patient seen and examined at bedside, weaned off oxygen today.  Palliative care consulted for goals of care.   Assessment & Plan    Assessment and Plan:   Acute respiratory failure with hypoxia secondary to sepsis from pneumonia.   Febrile on admission,with RR 36/min,  BP 94/52 mm hg hypotensive, hypoxic with oxygen sats around 88% on RA, leukocytosis of 15,000, lactic acid wnl.  She was started on IV antibiotics empirically transition to IV ceftriaxone and IV azithromycin Initially required 2 lit of [place] oxygen, weaned off .  Follow up blood cultures, negative so far.  Pro calcitonin is 3.37. improving leukocytosis. COVID 19 PCR is negative.  Respiratory panel is negative.  UA negative.  CXR shows Retrocardiac airspace opacity is mildly increased and likely represents combination of known pleural effusion and atelectasis although a component of pneumonia cannot be excluded. Plan for another 24 hours of IV Antibiotics and possible d/c in am if she  continues to improve.   Acute metabolic and toxic encephalopathy  with a component of hepatic encephalopathy.  Ammonia elevated at 39. Also from pneumonia.  She is answering all questions, but remains full assist. Therapy evaluations ordered.  Recommend SNF placement, but family wants to take her home.     Left breast invasive carcinoma with underlying DCIS/ bilateral mastectomies.  With metastatic lesions to the brain and about 40 metastatic lesions in the liver evident on CT from 06/18/23.  Unable to start the Herceptin injections due to insurance issues.  She follows up with Dr Pamelia Hoit in the office.  In view of her deconditioning , hepatic encephalopathy and new metastatic lesions, will request palliative care consulted for goals of care.    Elevated liver enzymes, alk phos, AST, ALT  - probably from liver mets.    Anemia of chronic disease Baseline hemoglobin around between 12 to 14. Slow decline in hemoglobin from 02/2023 to 10 in 03/2023 to 8.6 in July 2024.  Transfuse to keep hemoglobin greater than 7.    Hypokalemia/ hypomagnesemia: Replaced and repeat level wnl.    Hypertension:  Optimally controlled.    Dementia  Continue with namenda.   Chronic indwelling foley catheter:   Chronic HFpEF secondary to  TTR amyloidosis:  She appears euvolemic.   Paroxysmal atrial fibrillation:  Rate controlled with metoprolol 50 mg BID.  Not on anti coagulation due to brain mets and increased risk of bleeding.   H/o CVA   Type 2 DM:  Diet controlled.  A1c is 6.5 % in 2 /2024.   Leukocytosis:  Persistent, suspect from infection.  Pro calcitonin is 3.37    RN Pressure Injury Documentation: Pressure Injury 03/09/23 Buttocks Right Stage 2 -  Partial thickness loss of dermis presenting as a shallow open injury with a red, pink wound bed without slough. less than 1cm size. pink/red (Active)  03/09/23 1854  Location: Buttocks  Location Orientation: Right  Staging: Stage 2 -   Partial thickness loss of dermis presenting as a shallow open injury with a red, pink wound bed without slough.  Wound Description (Comments): less than 1cm size. pink/red  Present on Admission: Yes  Dressing Type Foam - Lift dressing to assess site every shift 07/01/23 2300     Pressure Injury 06/22/23 Heel Right Stage 3 -  Full thickness tissue loss. Subcutaneous fat may be visible but bone, tendon or muscle are NOT exposed. (Active)  06/22/23 0925  Location: Heel  Location Orientation: Right  Staging: Stage 3 -  Full thickness tissue loss. Subcutaneous fat may be visible but bone, tendon or muscle are NOT exposed.  Wound Description (Comments):   Present on Admission: Yes  Dressing Type Foam - Lift dressing to assess site every shift 07/01/23 2100     Pressure Injury 06/29/23 Sacrum Right;Left;Medial Stage 2 -  Partial thickness loss of dermis presenting as a shallow open injury with a red, pink wound bed without slough. (Active)  06/29/23 2340  Location: Sacrum  Location Orientation: Right;Left;Medial  Staging: Stage 2 -  Partial thickness loss of dermis presenting as a shallow open injury with a red, pink wound bed without slough.  Wound Description (Comments):   Present on Admission: Yes  Dressing Type Foam - Lift dressing to assess site every shift 07/01/23 2300   Wound care consulted and recommendations given.     Estimated body mass index is 24.17 kg/m as calculated from the following:   Height as of this encounter: 5' 2.99" (1.6 m).   Weight as of this encounter: 61.9 kg.  Code Status: DNR DVT Prophylaxis:  enoxaparin (LOVENOX) injection 40 mg Start: 06/30/23 1000   Level of Care: Level of care: Med-Surg Family Communication:  FAMIILY at bedside.   Disposition Plan:     Remains inpatient appropriate: IV antibiotics.   Procedures:  None.  Consultants:   Palliative care  Antimicrobials:   Anti-infectives (From admission, onward)    Start     Dose/Rate Route  Frequency Ordered Stop   07/01/23 1200  cefTRIAXone (ROCEPHIN) 2 g in sodium chloride 0.9 % 100 mL IVPB        2 g 200 mL/hr over 30 Minutes Intravenous Every 24 hours 07/01/23 1043 07/05/23 1159   07/01/23 1200  azithromycin (ZITHROMAX) 500 mg in sodium chloride 0.9 % 250 mL IVPB        500 mg 250 mL/hr over 60 Minutes Intravenous Every 24 hours 07/01/23 1043 07/06/23 1159   07/01/23 1000  vancomycin (VANCOREADY) IVPB 1250 mg/250 mL  Status:  Discontinued        1,250 mg 166.7 mL/hr over 90 Minutes Intravenous Every 36 hours 06/29/23 2250 07/01/23 1043   06/30/23 0400  piperacillin-tazobactam (ZOSYN) IVPB 3.375 g  Status:  Discontinued  3.375 g 12.5 mL/hr over 240 Minutes Intravenous Every 8 hours 06/29/23 2250 07/01/23 1043   06/29/23 2045  vancomycin (VANCOCIN) IVPB 1000 mg/200 mL premix        1,000 mg 200 mL/hr over 60 Minutes Intravenous  Once 06/29/23 2042 06/29/23 2234   06/29/23 2045  piperacillin-tazobactam (ZOSYN) IVPB 3.375 g        3.375 g 100 mL/hr over 30 Minutes Intravenous  Once 06/29/23 2042 06/29/23 2128        Medications  Scheduled Meds:  Chlorhexidine Gluconate Cloth  6 each Topical Daily   enoxaparin (LOVENOX) injection  40 mg Subcutaneous Q24H   feeding supplement  237 mL Oral Q24H   lactulose  20 g Oral BID   leptospermum manuka honey  1 Application Topical Daily   memantine  10 mg Oral BID   metoprolol tartrate  50 mg Oral BID   nutrition supplement (JUVEN)  1 packet Oral BID BM   pneumococcal 20-valent conjugate vaccine  0.5 mL Intramuscular Tomorrow-1000   Tafamidis  61 mg Oral Daily   Continuous Infusions:  azithromycin Stopped (07/01/23 1328)   cefTRIAXone (ROCEPHIN)  IV 2 g (07/02/23 1202)   PRN Meds:.acetaminophen **OR** acetaminophen, ondansetron **OR** ondansetron (ZOFRAN) IV, senna-docusate    Subjective:   Amanda Green was seen and examined today. More alert and eating breakfast.  No chest pain or sob. No nausea, vomiting or  abdominal pain.  Objective:   Vitals:   07/01/23 0515 07/01/23 1509 07/01/23 2115 07/02/23 0532  BP: (!) 107/53 114/68 (!) 148/65 (!) 109/49  Pulse: 64 64 70 66  Resp: 14 15 18 16   Temp: 98.5 F (36.9 C) 98 F (36.7 C) 98 F (36.7 C) 98.6 F (37 C)  TempSrc: Oral Oral Oral Axillary  SpO2: 100% 100% 100% 98%  Weight:      Height:        Intake/Output Summary (Last 24 hours) at 07/02/2023 1207 Last data filed at 07/02/2023 4132 Gross per 24 hour  Intake 570 ml  Output 550 ml  Net 20 ml   Filed Weights   06/29/23 1956  Weight: 61.9 kg     Exam General exam: frail elderly lady , ill appearing, not in distress.  Respiratory system: diminished air entry at  bases.  Cardiovascular system: S1 & S2 heard, RRR.  Gastrointestinal system: Abdomen is nondistended, soft and nontender.  Central nervous system: Alert and oriented to person and place.  Extremities: no edema.  Skin: No rashes, sacral decubitus ulcer, stage 3 heel ulcer.  Psychiatry: flat affect.     Data Reviewed:  I have personally reviewed following labs and imaging studies   CBC Lab Results  Component Value Date   WBC 10.8 (H) 07/02/2023   RBC 2.95 (L) 07/02/2023   HGB 8.6 (L) 07/02/2023   HCT 26.7 (L) 07/02/2023   MCV 90.5 07/02/2023   MCH 29.2 07/02/2023   PLT 318 07/02/2023   MCHC 32.2 07/02/2023   RDW 19.3 (H) 07/02/2023   LYMPHSABS 1.0 07/02/2023   MONOABS 0.8 07/02/2023   EOSABS 0.3 07/02/2023   BASOSABS 0.1 07/02/2023     Last metabolic panel Lab Results  Component Value Date   NA 135 07/02/2023   K 4.0 07/02/2023   CL 104 07/02/2023   CO2 24 07/02/2023   BUN 13 07/02/2023   CREATININE 0.53 07/02/2023   GLUCOSE 105 (H) 07/02/2023   GFRNONAA >60 07/02/2023   GFRAA 72 02/07/2021   CALCIUM 9.3 07/02/2023  PHOS 2.2 (L) 03/19/2023   PROT 5.4 (L) 07/02/2023   ALBUMIN 2.1 (L) 07/02/2023   LABGLOB 3.4 01/03/2022   AGRATIO 1.5 02/07/2021   BILITOT 0.7 07/02/2023   ALKPHOS 383 (H)  07/02/2023   AST 91 (H) 07/02/2023   ALT 43 07/02/2023   ANIONGAP 7 07/02/2023    CBG (last 3)  No results for input(s): "GLUCAP" in the last 72 hours.    Coagulation Profile: Recent Labs  Lab 06/30/23 0831  INR 1.3*     Radiology Studies: No results found.     Kathlen Mody M.D. [facility] 07/02/2023, 12:07 PM  Available via Epic secure chat 7am-7pm After 7 pm, please refer to night coverage provider listed on amion.

## 2023-07-03 ENCOUNTER — Other Ambulatory Visit: Payer: Self-pay | Admitting: Hematology and Oncology

## 2023-07-03 ENCOUNTER — Telehealth: Payer: Self-pay | Admitting: Hematology and Oncology

## 2023-07-03 ENCOUNTER — Encounter: Payer: Self-pay | Admitting: Hematology and Oncology

## 2023-07-03 DIAGNOSIS — C50919 Malignant neoplasm of unspecified site of unspecified female breast: Secondary | ICD-10-CM

## 2023-07-03 DIAGNOSIS — J189 Pneumonia, unspecified organism: Secondary | ICD-10-CM

## 2023-07-03 LAB — COMPREHENSIVE METABOLIC PANEL
ALT: 36 U/L (ref 0–44)
AST: 88 U/L — ABNORMAL HIGH (ref 15–41)
Albumin: 2 g/dL — ABNORMAL LOW (ref 3.5–5.0)
Alkaline Phosphatase: 416 U/L — ABNORMAL HIGH (ref 38–126)
Anion gap: 7 (ref 5–15)
BUN: 17 mg/dL (ref 8–23)
CO2: 23 mmol/L (ref 22–32)
Calcium: 9.1 mg/dL (ref 8.9–10.3)
Chloride: 104 mmol/L (ref 98–111)
Creatinine, Ser: 0.32 mg/dL — ABNORMAL LOW (ref 0.44–1.00)
GFR, Estimated: >60 mL/min (ref >60)
Glucose, Bld: 77 mg/dL (ref 70–99)
Potassium: 3.7 mmol/L (ref 3.5–5.1)
Sodium: 134 mmol/L — ABNORMAL LOW (ref 135–145)
Total Bilirubin: 0.7 mg/dL (ref 0.3–1.2)
Total Protein: 5.3 g/dL — ABNORMAL LOW (ref 6.5–8.1)

## 2023-07-03 LAB — CBC WITH DIFFERENTIAL/PLATELET
Abs Immature Granulocytes: 0.21 10*3/uL — ABNORMAL HIGH (ref 0.00–0.07)
Basophils Absolute: 0.1 10*3/uL (ref 0.0–0.1)
Basophils Relative: 1 %
Eosinophils Absolute: 0.3 10*3/uL (ref 0.0–0.5)
Eosinophils Relative: 3 %
HCT: 28.8 % — ABNORMAL LOW (ref 36.0–46.0)
Hemoglobin: 9.2 g/dL — ABNORMAL LOW (ref 12.0–15.0)
Immature Granulocytes: 2 %
Lymphocytes Relative: 12 %
Lymphs Abs: 1.1 10*3/uL (ref 0.7–4.0)
MCH: 29.5 pg (ref 26.0–34.0)
MCHC: 31.9 g/dL (ref 30.0–36.0)
MCV: 92.3 fL (ref 80.0–100.0)
Monocytes Absolute: 0.7 10*3/uL (ref 0.1–1.0)
Monocytes Relative: 8 %
Neutro Abs: 6.6 10*3/uL (ref 1.7–7.7)
Neutrophils Relative %: 74 %
Platelets: 307 10*3/uL (ref 150–400)
RBC: 3.12 MIL/uL — ABNORMAL LOW (ref 3.87–5.11)
RDW: 19.4 % — ABNORMAL HIGH (ref 11.5–15.5)
WBC: 9 10*3/uL (ref 4.0–10.5)
nRBC: 0 % (ref 0.0–0.2)

## 2023-07-03 NOTE — TOC Transition Note (Addendum)
Transition of Care [facility]) - CM/SW Discharge Note   Patient Details  Name: Amanda Green MRN: [id number] Date of Birth: 1935/10/17  [facility] CM/SW Contact:  Beckie Busing, RN Phone [phone]  07/03/2023, 10:05 AM   Clinical Narrative:    Patient with discharge orders. CM at bedside to provide [facility] options to patient and daughter. Daughter states that patient is active with [facility] and wishes to continue services with [facility]. [facility] hospital liaison has been notified.Daughter states that patient has all needed DME at home. Currently there are no other needs. TOC will sign off.    1536 patient to be transported home via [facility]. Address verified with daughter. [facility] notified. D/c packet is at nurses station.    Final next level of care: Home w Home Health Services Barriers to Discharge: No Barriers Identified   Patient Goals and CMS Choice CMS Medicare.gov Compare Post Acute Care list provided to:: Patient Represenative (must comment) Choice offered to / list presented to : Patient, Adult Children  Discharge Placement                         Discharge Plan and Services Additional resources added to the After Visit Summary for     Discharge Planning Services: Follow-up appt scheduled Post Acute Care Choice: Home Health          DME Arranged: N/A DME Agency: NA       HH Arranged: PT, OT, Nurse's Aide, RN HH Agency: [facility] Date [facility] Agency Contacted: 07/03/23 Time HH Agency Contacted: 1005 Representative spoke with at [facility] Agency: Kandee Keen  Social Determinants of Health (SDOH) Interventions SDOH Screenings   Food Insecurity: No Food Insecurity (06/29/2023)  Housing: Low Risk  (06/29/2023)  Transportation Needs: No Transportation Needs (06/29/2023)  Utilities: Not At Risk (06/29/2023)  Tobacco Use: Low Risk  (06/30/2023)     Readmission Risk Interventions    07/02/2023   12:00 PM 07/01/2023   12:14 PM 07/01/2023   12:11 PM   Readmission Risk Prevention Plan  Transportation Screening Complete    Medication Review (RN Care Manager) Referral to Pharmacy  Complete  PCP or Specialist appointment within 3-5 days of discharge Complete Not Complete   PCP/Specialist Appt Not Complete comments patient does have PCP and will follow up appt schediled with specialists   HRI or Home Care Consult Complete    HRI or Home Care Consult Pt Refusal Comments list provided for daughter    SW Recovery Care/Counseling Consult Complete Not Complete   Palliative Care Screening Complete    Skilled Nursing Facility Patient Refused Not Complete

## 2023-07-03 NOTE — Telephone Encounter (Signed)
I called patient's daughter and discussed with her that her insurance denied Herceptin injection and therefore we are going to try to give the Herceptin intravenously.  I will request our schedulers to set up appointment for every 3-week Herceptin infusions

## 2023-07-03 NOTE — Plan of Care (Signed)
  Problem: Education: Goal: Knowledge of General Education information will improve Description: Including pain rating scale, medication(s)/side effects and non-pharmacologic comfort measures Outcome: Progressing   Problem: Clinical Measurements: Goal: Will remain free from infection Outcome: Progressing   Problem: Nutrition: Goal: Adequate nutrition will be maintained Outcome: Progressing   Problem: Pain Managment: Goal: General experience of comfort will improve Outcome: Progressing   Problem: Skin Integrity: Goal: Risk for impaired skin integrity will decrease Outcome: Progressing   

## 2023-07-03 NOTE — Progress Notes (Signed)
  Daily Progress Note   Patient Name: Amanda Green       Date: 07/03/2023 DOB: [birth year]  Age: 88 y.o. MRN#: [id number] Attending Physician: Hughie Closs, MD Primary Care Physician: Ollen Bowl, MD Admit Date: 06/29/2023 Length of Stay: 4 days  Patient being discharged today as per EMR and as per discussions with patient/family yesterday. Patient has been offered cancer directed therapies including maintenance with Herceptin and radiosurgery for brain mets. Patient and family planning to continue with this care. Patient already follow by outpatient palliative medicine group, [facility], who will continue to be involved in patient's care. Thank you for involving the PMT in patient's care. Please reach out if acute PMT team needs arise.   Alvester Morin, DO Palliative Care Provider PMT # [phone]

## 2023-07-03 NOTE — Discharge Summary (Addendum)
Physician Discharge Summary  Amanda Green UJW:[id number] DOB: 06-17-1935 DOA: 06/29/2023  PCP: Ollen Bowl, MD  Admit date: 06/29/2023 Discharge date: 07/03/2023 30 Day Unplanned Readmission Risk Score    Flowsheet Row ED to Hosp-Admission (Current) from 06/29/2023 in [facility]  30 Day Unplanned Readmission Risk Score (%) 42.03 Filed at 07/03/2023 0801       This score is the patient's risk of an unplanned readmission within 30 days of being discharged (0 -100%). The score is based on dignosis, age, lab data, medications, orders, and past utilization.   Low:  0-14.9   Medium: 15-21.9   High: 22-29.9   Extreme: 30 and above          Admitted From: Home Disposition: Home  Recommendations for Outpatient Follow-up:  Follow up with PCP in 1-2 weeks Please obtain BMP/CBC in one week Please follow up with your PCP on the following pending results: Unresulted Labs (From admission, onward)     Start     Ordered   07/06/23 0500  Creatinine, serum  (enoxaparin (LOVENOX)    CrCl >/= 30 ml/min)  Weekly,   R     Comments: while on enoxaparin therapy    06/29/23 2239              Home Health: Yes Equipment/Devices: None  Discharge Condition: Stable CODE STATUS: DNR Diet recommendation: Cardiac  Subjective: Seen and examined.  Doing well.  No complaints.  Daughter at the bedside.  They are agreeable with the plan of discharge today.  Brief/Interim Summary: 88 year old female with past medical history of HTN, dementia, HFpEF, paroxysmal atrial fibrillation, cardiac amyloidosis, breast cancer with brain and liver mets, history of CVA, and chronic indwelling Foley catheter since last admission.  Patient recently admitted 7/5- 7/7 with encephalopathy and declining functional status.    This time, she presented with shortness of breath and chest x-ray shows retrocardiac airspace opacity mild increased likely represents combination of known pleural effusion  and atelectasis, some component of pneumonia cannot be excluded. Blood cultures x 2 collected, she was admitted under hospitalist service with the diagnosis of severe sepsis based on hypoxic respiratory failure and treatment started with IV Zosyn and then transition to IV Rocephin and azithromycin. Patient was hypoxic and  placed on 2 L nasal cannula initially but weaned to room air soon after.  Patient has remained stable for last 2 days now.  Patient was assessed by PT OT who recommended SNF however family has decided to take her home with home health.  Of note, patient completed 5 days of IV antibiotics so no further antibiotics are being prescribed at the time of discharge.   Acute metabolic and toxic encephalopathy  with a component of hepatic encephalopathy.  Ammonia elevated at 39. Also from pneumonia.  She is answering all questions, and is back to baseline.   Left breast invasive carcinoma with underlying DCIS/ bilateral mastectomies.  With metastatic lesions to the brain and about 40 metastatic lesions in the liver evident on CT from 06/18/23.  She follows up with Dr Pamelia Hoit in the office.  She was seen by palliative care here.   Elevated liver enzymes, alk phos, AST, ALT  - probably from liver mets.    Anemia of chronic disease Baseline hemoglobin around between 12 to 14. Slow decline in hemoglobin from 02/2023 to 10 in 03/2023 to 8.6 in July 2024.    Hypokalemia/ hypomagnesemia: Replaced and resolved.   Hypertension:  Optimally controlled.  Dementia  Continue with namenda.    Chronic indwelling foley catheter:    Chronic HFpEF secondary to TTR amyloidosis:  She appears euvolemic.    Paroxysmal atrial fibrillation:  Rate controlled with metoprolol 50 mg BID.  Not on anti coagulation due to brain mets and increased risk of bleeding.  Resume home amiodarone.   Type 2 DM:  Diet controlled.  A1c is 6.5 % in 2 /2024.  Discharge plan was discussed with patient and/or family  member and they verbalized understanding and agreed with it.  Discharge Diagnoses:  Principal Problem:   HCAP (healthcare-associated pneumonia) Active Problems:   Breast cancer metastasized to brain [facility])   Cardiac amyloidosis (HCC)   Essential hypertension   History of CVA (cerebrovascular accident)   Elevated LFTs   Dementia without behavioral disturbance (HCC)   Atrial fibrillation, chronic (HCC)   (HFpEF) heart failure with preserved ejection fraction (HCC)   Protein-calorie malnutrition, severe   Counseling and coordination of care   Goals of care, counseling/discussion   Palliative care encounter   Need for emotional support    Discharge Instructions   Allergies as of 07/03/2023       Reactions   Neurontin [gabapentin] Other (See Comments)   Drowsiness        Medication List     TAKE these medications    acetaminophen 650 MG CR tablet Commonly known as: TYLENOL Take 650-1,300 mg by mouth 2 (two) times daily as needed for pain.   albuterol (2.5 MG/3ML) 0.083% nebulizer solution Commonly known as: PROVENTIL Take 2.5 mg by nebulization 2 (two) times daily as needed for wheezing or shortness of breath.   amiodarone 100 MG tablet Commonly known as: Pacerone Take 1 tablet (100 mg total) by mouth daily.   ASCORBIC ACID PO Take 1 tablet by mouth daily. Vitamin C, unknown strength.   brimonidine 0.2 % ophthalmic solution Commonly known as: ALPHAGAN Place 1 drop into both eyes daily.   diclofenac Sodium 1 % Gel Commonly known as: VOLTAREN Apply 1 Application topically 4 (four) times daily as needed (joint pain).   dorzolamide 2 % ophthalmic solution Commonly known as: TRUSOPT Place 1 drop into both eyes 2 (two) times daily.   furosemide 20 MG tablet Commonly known as: LASIX Take 1 tablet (20 mg total) by mouth daily.   hydrocerin Crea Apply 1 Application topically daily. Apply to left leg and heel once daily, do not apply between digits    hydrochlorothiazide 12.5 MG tablet Commonly known as: HYDRODIURIL Take 12.5 mg by mouth daily.   leptospermum manuka honey Pste paste Apply 1 Application topically daily. Apply to right heel Unstageable PI and sacral stage 2 PI after cleansing Apply thin layer (3 mm) to wound.   losartan 25 MG tablet Commonly known as: COZAAR Take 1 tablet (25 mg total) by mouth daily.   MAGNESIUM OXIDE PO Take 1 tablet by mouth daily.   memantine 10 MG tablet Commonly known as: Namenda Take 1 tablet (10 mg total) by mouth 2 (two) times daily.   metoprolol tartrate 50 MG tablet Commonly known as: LOPRESSOR Take 1 tablet (50 mg total) by mouth 2 (two) times daily.   Multivitamin Women 50+ Tabs Take 1 tablet by mouth daily.   spironolactone 25 MG tablet Commonly known as: ALDACTONE Take 1 tablet (25 mg total) by mouth daily.   TURMERIC CURCUMIN PO Take 1 tablet by mouth daily.   VITAMIN B-12 PO Take 1 tablet by mouth daily.   Vyndamax  61 MG Caps Generic drug: Tafamidis Take 61 mg by mouth daily.        Follow-up Information     Kesha Hurrell, Rinka R, MD Follow up in 1 week(s).   Specialty: Internal Medicine Contact information: [address] [place] [address] [phone]                Allergies  Allergen Reactions   Neurontin [Gabapentin] Other (See Comments)    Drowsiness    Consultations: Palliative care   Procedures/Studies: DG Chest Port 1 View  Result Date: 06/29/2023 CLINICAL DATA:  Fever, cough, hypoxia, weakness, fatigue. Metastatic breast cancer. EXAM: PORTABLE CHEST 1 VIEW COMPARISON:  06/21/2023 FINDINGS: Retrocardiac airspace opacity is mildly increased and likely represents combination of known pleural effusion and atelectasis although a component of pneumonia cannot be excluded. Atherosclerotic calcification of the aortic arch. Right axillary lymph node clips. Bony demineralization. The right lung appears clear. IMPRESSION: 1.  Retrocardiac airspace opacity is mildly increased and likely represents combination of known pleural effusion and atelectasis although a component of pneumonia cannot be excluded. 2.  Aortic Atherosclerosis (ICD10-I70.0). Electronically Signed   By: Gaylyn Rong M.D.   On: 06/29/2023 20:39   MR BRAIN W WO CONTRAST  Result Date: 06/22/2023 CLINICAL DATA:  History of breast cancer with brain metastases, altered mental status, lethargy. EXAM: MRI HEAD WITHOUT AND WITH CONTRAST TECHNIQUE: Multiplanar, multiecho pulse sequences of the brain and surrounding structures were obtained without and with intravenous contrast. CONTRAST:  7mL GADAVIST GADOBUTROL 1 MMOL/ML IV SOLN COMPARISON:  CT head 1 day prior, brain MRI 02/14/2023 FINDINGS: Brain: The 2.0 cm AP x 1.9 cm TV x 1.9 cm cc peripherally enhancing metastatic lesion in the left occipital lobe is decreased in size, previously measured 2.9 cm x 2.9 cm x 3.0 cm. Surrounding edema persists but has improved, with decreased mass effect on the surrounding structures and re-expansion of the occipital horn. A small amount of SWI signal dropout within this lesion is consistent with intralesional hemorrhage. A 0.4 cm enhancing lesion in the right occipital cortex is new since the prior study (10-30). A 0.4 cm lesion in the left temporal cortex is new (10-23). A 0.3 cm lesion in the left middle frontal gyrus is new (10-28). A punctate focus of enhancement in the left occipital cortex inferior to the dominant lesion is unchanged (10-23). There is no evidence of acute intracranial hemorrhage, extra-axial fluid collection, or acute infarct. Background parenchymal volume is normal. The ventricles are otherwise stable in size. Background chronic small-vessel ischemic changes in the supratentorial white matter and pons are stable. A small focus of SWI signal dropout in the right occipital lobe is unchanged, nonspecific. A probable small focus of nodular gray matter heterotopia  along the body of the right lateral ventricle is unchanged. The pituitary and suprasellar region are normal. There is no midline shift. Vascular: Normal flow voids. Skull and upper cervical spine: Normal marrow signal. Sinuses/Orbits: Paranasal sinuses are clear. Bilateral lens implants are in place. The globes and orbits are otherwise unremarkable. Other: Is a left mastoid effusion, new/increased since the prior study. The imaged nasopharynx is unremarkable. IMPRESSION: Since the MRI from 02/14/2023, the largest metastatic lesion in the left occipital lobe has decreased in size with decreased surrounding edema, but there are three new subcentimeter lesions in the brain described above. Findings consistent with mixed response. Electronically Signed   By: Lesia Hausen M.D.   On: 06/22/2023 18:24   CT Head Wo  Contrast  Result Date: 06/21/2023 CLINICAL DATA:  Neuro deficit, acute, stroke suspected. Weakness and lethargy. History of stroke and breast cancer. EXAM: CT HEAD WITHOUT CONTRAST TECHNIQUE: Contiguous axial images were obtained from the base of the skull through the vertex without intravenous contrast. RADIATION DOSE REDUCTION: This exam was performed according to the departmental dose-optimization program which includes automated exposure control, adjustment of the mA and/or kV according to patient size and/or use of iterative reconstruction technique. COMPARISON:  Head CT 03/30/2023 and MRI 02/14/2023 FINDINGS: Brain: There is mild residual left parieto-occipital vasogenic edema, decreased from the prior CT. There is a small amount of calcification associated with the known underlying mass. No new brain edema, acute infarct, acute hemorrhage, midline shift, or extra-axial fluid collection is identified. Mild cerebral atrophy is within normal limits for age. Hypodensities elsewhere in the cerebral white matter bilaterally are unchanged and nonspecific but compatible with mild-to-moderate chronic small  vessel ischemic disease. A 1 cm focus of subependymal gray matter heterotopia is again noted along the posterior body of the right lateral ventricle. Vascular: Calcified atherosclerosis at the skull base. No hyperdense vessel. Skull: No acute fracture or suspicious osseous lesion. Sinuses/Orbits: Paranasal sinuses and mastoid air cells are clear. Bilateral cataract extraction Other: None. IMPRESSION: 1. No evidence of acute intracranial abnormality. 2. Known left parieto-occipital mass with decreased, mild residual edema. 3. Mild-to-moderate chronic small vessel ischemic disease. Electronically Signed   By: Sebastian Ache M.D.   On: 06/21/2023 15:07   DG Foot 2 Views Right  Result Date: 06/21/2023 CLINICAL DATA:  Weakness. EXAM: RIGHT FOOT - 2 VIEW COMPARISON:  None Available. FINDINGS: No fracture. Significant narrowing with marginal osteophytes at the first metatarsophalangeal joint suspected to be osteoarthritis. Remaining joints normally spaced and aligned. Moderate-sized plantar calcaneal spur. There is soft tissue swelling most evident over the dorsal forefoot. No soft tissue air. IMPRESSION: 1. No fracture or acute skeletal finding. 2. Nonspecific soft tissue swelling. 3. 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 Plantar calcaneal spur. Electronically Signed   By: Amie Portland M.D.   On: 06/21/2023 13:26   DG Chest Portable 1 View  Result Date: 06/21/2023 CLINICAL DATA:  Weakness. EXAM: PORTABLE CHEST 1 VIEW COMPARISON:  04/01/2023 and older exams.  CT, 06/18/2023. FINDINGS: Left lung base opacity obscures the left hemidiaphragm consistent with a small effusion and associated atelectasis or infection. Atelectasis favored and appearance is without significant change from the recent chest CT. Remainder of the lungs is clear. No right pleural effusion. No pneumothorax. Heart top-normal in size.  No mediastinal or hilar masses. Skeletal structures are grossly intact. Stable changes  from right breast surgery. IMPRESSION: 1. Left lung base opacity consistent with a combination of pleural fluid and atelectasis. Pneumonia should be considered in the proper clinical setting. Overall, appearance is stable compared to the recent prior CT from 06/18/2023. Electronically Signed   By: Amie Portland M.D.   On: 06/21/2023 13:23   CT CHEST ABDOMEN PELVIS W CONTRAST  Result Date: 06/19/2023 CLINICAL DATA:  Metastatic disease evaluation. Breast carcinoma with brain metastasis. * Tracking Code: BO * EXAM: CT CHEST, ABDOMEN, AND PELVIS WITH CONTRAST TECHNIQUE: Multidetector CT imaging of the chest, abdomen and pelvis was performed following the standard protocol during bolus administration of intravenous contrast. RADIATION DOSE REDUCTION: This exam was performed according to the departmental dose-optimization program which includes automated exposure control, adjustment of the mA and/or kV according to patient size and/or use of iterative reconstruction technique. CONTRAST:  OMNIPAQUE  IOHEXOL 300 MG/ML  SOLN COMPARISON:  CT chest abdomen pelvis 02/14/2023, MRI 03/11/2006 FINDINGS: CT CHEST FINDINGS Cardiovascular: No significant vascular findings. Normal heart size. No pericardial effusion. Mediastinum/Nodes: Nodular mass in the high LEFT axilla measures 2 cm decreased from 3.6 cm on CT chest 03/09/2023. No mediastinal or hilar adenopathy. No pericardial fluid. Esophagus normal. Lungs/Pleura: Bilateral pleural effusions with small effusion on the RIGHT and small to moderate effusion LEFT. There is passive atelectasis lower lobes. No suspicious pulmonary nodularity. Musculoskeletal: No skeletal metastasis. CT ABDOMEN AND PELVIS FINDINGS Hepatobiliary: New multifocal hypoenhancing round lesions of LEFT and RIGHT hepatic lobe. Approximately 40 lesions in liver. Example lesion the caudate lobe measures 2.1 cm (image 47). Example lesion inferior RIGHT hepatic lobe measures 2.4 cm (image 61). High-density  within the gallbladder sludge sludge. No biliary duct dilatation. Pancreas: Cystic lesion along the genu of the pancreas measures 13 mm compared to 13 mm on comparison CT. No pancreatic duct dilatation. Smaller cystic lesion in the head of the pancreas measuring 10 mm. Spleen: Low-density lesion in the spleen is favored a benign cyst or hemangioma. Adrenals/urinary tract: Enlargement of the LEFT adrenal gland to 15 mm not changed from comparison exam. Lesion identified as benign adenoma on MRI 2007. The kidneys, ureters and bladder normal. Foley catheter within the bladder Stomach/Bowel: The stomach, duodenum, and small bowel normal. Partial resection of the ascending colon. Multiple diverticula of the descending colon and sigmoid colon without acute inflammation. Vascular/Lymphatic: Abdominal aorta is normal caliber with atherosclerotic calcification. There is no retroperitoneal or periportal lymphadenopathy. No pelvic lymphadenopathy. Reproductive: Uterus and adnexa unremarkable. Other: No free fluid. Musculoskeletal: No aggressive osseous lesion. IMPRESSION: CHEST: 1. Decreased size of LEFT axillary mass. 2. No evidence of pulmonary metastatic disease. 3. Bilateral pleural effusions with passive atelectasis. PELVIS: 1. New multifocal bilobar HEPATIC METASTASIS. 2. Stable LEFT adrenal gland adenoma characterized on MRI 03/11/2026 3. Stable cystic lesions in the pancreas. Recommend attention on routine follow-up. 4. Colonic diverticulosis without evidence diverticulitis. 5.  Aortic Atherosclerosis (ICD10-I70.0). Electronically Signed   By: Genevive Bi M.D.   On: 06/19/2023 13:07     Discharge Exam: Vitals:   07/02/23 2026 07/03/23 0428  BP: 137/65 138/71  Pulse: 73 73  Resp: 18 18  Temp: 98.6 F (37 C) 98.6 F (37 C)  SpO2: 97% 96%   Vitals:   07/02/23 0532 07/02/23 1256 07/02/23 2026 07/03/23 0428  BP: (!) 109/49 (!) 114/55 137/65 138/71  Pulse: 66 72 73 73  Resp: 16 17 18 18   Temp: 98.6 F  (37 C) 98.4 F (36.9 C) 98.6 F (37 C) 98.6 F (37 C)  TempSrc: Axillary Oral Oral Oral  SpO2: 98% 97% 97% 96%  Weight:      Height:        General: Pt is alert, awake, not in acute distress Cardiovascular: RRR, S1/S2 +, no rubs, no gallops Respiratory: CTA bilaterally, no wheezing, no rhonchi Abdominal: Soft, NT, ND, bowel sounds + Extremities: no edema, no cyanosis    The results of significant diagnostics from this hospitalization (including imaging, microbiology, ancillary and laboratory) are listed below for reference.     Microbiology: Recent Results (from the past 240 hour(s))  Blood culture (routine x 2)     Status: None (Preliminary result)   Collection Time: 06/29/23  8:00 PM   Specimen: BLOOD  Result Value Ref Range Status   Specimen Description   Final    BLOOD BLOOD LEFT HAND Performed at [facility]  Hospital, [address] [place], [place] [address]    Special Requests   Final    Blood Culture results may not be optimal due to an inadequate volume of blood received in culture bottles BOTTLES DRAWN AEROBIC AND ANAEROBIC Performed at [facility], [address], [place], [place] [address]    Culture   Final    NO GROWTH 3 DAYS Performed at [facility], [address], [place], [place] [address]    Report Status PENDING  Incomplete  Resp panel by RT-PCR (RSV, Flu A&B, Covid) Anterior Nasal Swab     Status: None   Collection Time: 06/29/23  8:17 PM   Specimen: Anterior Nasal Swab  Result Value Ref Range Status   SARS Coronavirus 2 by RT PCR NEGATIVE NEGATIVE Final    Comment: (NOTE) SARS-CoV-2 target nucleic acids are NOT DETECTED.  The SARS-CoV-2 RNA is generally detectable in upper respiratory specimens during the acute phase of infection. The lowest concentration of SARS-CoV-2 viral copies this assay can detect is 138 copies/mL. A negative result does not preclude SARS-Cov-2 infection and should not be used as  the sole basis for treatment or other patient management decisions. A negative result may occur with  improper specimen collection/handling, submission of specimen other than nasopharyngeal swab, presence of viral mutation(s) within the areas targeted by this assay, and inadequate number of viral copies(<138 copies/mL). A negative result must be combined with clinical observations, patient history, and epidemiological information. The expected result is Negative.  Fact Sheet for Patients:  BloggerCourse.com  Fact Sheet for Healthcare Providers:  SeriousBroker.it  This test is no t yet approved or cleared by the [place] FDA and  has been authorized for detection and/or diagnosis of SARS-CoV-2 by FDA under an Emergency Use Authorization (EUA). This EUA will remain  in effect (meaning this test can be used) for the duration of the COVID-19 declaration under Section 564(b)(1) of the Act, 21 U.S.C.section 360bbb-3(b)(1), unless the authorization is terminated  or revoked sooner.       Influenza A by PCR NEGATIVE NEGATIVE Final   Influenza B by PCR NEGATIVE NEGATIVE Final    Comment: (NOTE) The Xpert Xpress SARS-CoV-2/FLU/RSV plus assay is intended as an aid in the diagnosis of influenza from Nasopharyngeal swab specimens and should not be used as a sole basis for treatment. Nasal washings and aspirates are unacceptable for Xpert Xpress SARS-CoV-2/FLU/RSV testing.  Fact Sheet for Patients: BloggerCourse.com  Fact Sheet for Healthcare Providers: SeriousBroker.it  This test is not yet approved or cleared by the [place] FDA and has been authorized for detection and/or diagnosis of SARS-CoV-2 by FDA under an Emergency Use Authorization (EUA). This EUA will remain in effect (meaning this test can be used) for the duration of the COVID-19 declaration under Section 564(b)(1) of  the Act, 21 U.S.C. section 360bbb-3(b)(1), unless the authorization is terminated or revoked.     Resp Syncytial Virus by PCR NEGATIVE NEGATIVE Final    Comment: (NOTE) Fact Sheet for Patients: BloggerCourse.com  Fact Sheet for Healthcare Providers: SeriousBroker.it  This test is not yet approved or cleared by the [place] FDA and has been authorized for detection and/or diagnosis of SARS-CoV-2 by FDA under an Emergency Use Authorization (EUA). This EUA will remain in effect (meaning this test can be used) for the duration of the COVID-19 declaration under Section 564(b)(1) of the Act, 21 U.S.C. section 360bbb-3(b)(1), unless the authorization is terminated or revoked.  Performed at [facility]  [facility], [address] [place], [place] [address]   Urine Culture     Status: None   Collection Time: 06/29/23  8:23 PM   Specimen: Urine, Catheterized  Result Value Ref Range Status   Specimen Description   Final    URINE, CATHETERIZED Performed at [facility], [address] [place], [place] [address]    Special Requests   Final    NONE Performed at [facility], [address], [place], [place] [address]    Culture   Final    NO GROWTH Performed at [facility], [address] [place], [place] [address]    Report Status 07/01/2023 FINAL  Final  Blood culture (routine x 2)     Status: None (Preliminary result)   Collection Time: 06/29/23  9:35 PM   Specimen: BLOOD  Result Value Ref Range Status   Specimen Description   Final    BLOOD BLOOD RIGHT FOREARM Performed at [facility], [address], [place], [place] [address]    Special Requests   Final    Blood Culture adequate volume BOTTLES DRAWN AEROBIC AND ANAEROBIC Performed at [facility], [address], [place], [place] [address]    Culture   Final    NO GROWTH 3  DAYS Performed at [facility], [address], [place], [place] [address]    Report Status PENDING  Incomplete  MRSA Next Gen by PCR, Nasal     Status: None   Collection Time: 06/30/23  1:20 PM   Specimen: Nasal Mucosa; Nasal Swab  Result Value Ref Range Status   MRSA by PCR Next Gen NOT DETECTED NOT DETECTED Final    Comment: (NOTE) The GeneXpert MRSA Assay (FDA approved for NASAL specimens only), is one component of a comprehensive MRSA colonization surveillance program. It is not intended to diagnose MRSA infection nor to guide or monitor treatment for MRSA infections. Test performance is not FDA approved in patients less than 4 years old. Performed at [facility], [address] [place], [place] [address]      Labs: BNP (last 3 results) Recent Labs    04/01/23 0553 04/02/23 0353 06/21/23 2200  BNP 271.6* 424.5* 267.3*   Basic Metabolic Panel: Recent Labs  Lab 06/29/23 2135 06/30/23 0548 07/01/23 0525 07/02/23 0514 07/03/23 0615  NA 132* 137 136 135 134*  K 3.7 3.3* 3.4* 4.0 3.7  CL 95* 100 101 104 104  CO2 27 26 27 24 23   GLUCOSE 168* 120* 88 105* 77  BUN 14 13 11 13 17   CREATININE 0.59 0.46 0.49 0.53 0.32*  CALCIUM 9.4 9.4 9.2 9.3 9.1  MG  --  1.7  --   --   --    Liver Function Tests: Recent Labs  Lab 06/29/23 2135 06/30/23 0548 07/01/23 0525 07/02/23 0514 07/03/23 0615  AST 344* 231* 120* 91* 88*  ALT 65* 54* 45* 43 36  ALKPHOS 443* 368* 370* 383* 416*  BILITOT 0.7 0.7 0.9 0.7 0.7  PROT 5.9* 5.3* 5.5* 5.4* 5.3*  ALBUMIN 2.4* 2.2* 2.1* 2.1* 2.0*   No results for input(s): "LIPASE", "AMYLASE" in the last 168 hours. Recent Labs  Lab 06/30/23 0831 07/02/23 0514  AMMONIA 39* 39*   CBC: Recent Labs  Lab 06/29/23 2000 06/30/23 0548 07/01/23 0525 07/02/23 0514 07/03/23 0615  WBC 18.4* 15.0* 15.0* 10.8* 9.0  NEUTROABS 16.0* 12.3* 12.6* 8.5* 6.6  HGB  9.9* 9.1* 8.6* 8.6* 9.2*  HCT 30.3* 28.2* 26.7* 26.7* 28.8*   MCV 89.1 91.0 89.6 90.5 92.3  PLT 333 266 289 318 307   Cardiac Enzymes: No results for input(s): "CKTOTAL", "CKMB", "CKMBINDEX", "TROPONINI" in the last 168 hours. BNP: Invalid input(s): "POCBNP" CBG: No results for input(s): "GLUCAP" in the last 168 hours. D-Dimer No results for input(s): "DDIMER" in the last 72 hours. Hgb A1c No results for input(s): "HGBA1C" in the last 72 hours. Lipid Profile No results for input(s): "CHOL", "HDL", "LDLCALC", "TRIG", "CHOLHDL", "LDLDIRECT" in the last 72 hours. Thyroid function studies No results for input(s): "TSH", "T4TOTAL", "T3FREE", "THYROIDAB" in the last 72 hours.  Invalid input(s): "FREET3" Anemia work up No results for input(s): "VITAMINB12", "FOLATE", "FERRITIN", "TIBC", "IRON", "RETICCTPCT" in the last 72 hours. Urinalysis    Component Value Date/Time   COLORURINE STRAW (A) 06/29/2023 2023   APPEARANCEUR CLEAR 06/29/2023 2023   LABSPEC 1.008 06/29/2023 2023   PHURINE 8.0 06/29/2023 2023   GLUCOSEU NEGATIVE 06/29/2023 2023   HGBUR NEGATIVE 06/29/2023 2023   BILIRUBINUR NEGATIVE 06/29/2023 2023   KETONESUR NEGATIVE 06/29/2023 2023   PROTEINUR NEGATIVE 06/29/2023 2023   UROBILINOGEN 0.2 03/23/2010 1156   NITRITE NEGATIVE 06/29/2023 2023   LEUKOCYTESUR NEGATIVE 06/29/2023 2023   Sepsis Labs Recent Labs  Lab 06/30/23 0548 07/01/23 0525 07/02/23 0514 07/03/23 0615  WBC 15.0* 15.0* 10.8* 9.0   Microbiology Recent Results (from the past 240 hour(s))  Blood culture (routine x 2)     Status: None (Preliminary result)   Collection Time: 06/29/23  8:00 PM   Specimen: BLOOD  Result Value Ref Range Status   Specimen Description   Final    BLOOD BLOOD LEFT HAND Performed at [facility], [address] [place], [place] [address]    Special Requests   Final    Blood Culture results may not be optimal due to an inadequate volume of blood received in culture bottles BOTTLES DRAWN AEROBIC AND  ANAEROBIC Performed at [facility], [address], [place], [place] [address]    Culture   Final    NO GROWTH 3 DAYS Performed at [facility], [address] [place], [place] [address]    Report Status PENDING  Incomplete  Resp panel by RT-PCR (RSV, Flu A&B, Covid) Anterior Nasal Swab     Status: None   Collection Time: 06/29/23  8:17 PM   Specimen: Anterior Nasal Swab  Result Value Ref Range Status   SARS Coronavirus 2 by RT PCR NEGATIVE NEGATIVE Final    Comment: (NOTE) SARS-CoV-2 target nucleic acids are NOT DETECTED.  The SARS-CoV-2 RNA is generally detectable in upper respiratory specimens during the acute phase of infection. The lowest concentration of SARS-CoV-2 viral copies this assay can detect is 138 copies/mL. A negative result does not preclude SARS-Cov-2 infection and should not be used as the sole basis for treatment or other patient management decisions. A negative result may occur with  improper specimen collection/handling, submission of specimen other than nasopharyngeal swab, presence of viral mutation(s) within the areas targeted by this assay, and inadequate number of viral copies(<138 copies/mL). A negative result must be combined with clinical observations, patient history, and epidemiological information. The expected result is Negative.  Fact Sheet for Patients:  BloggerCourse.com  Fact Sheet for Healthcare Providers:  SeriousBroker.it  This test is no t yet approved or cleared by the [place] FDA and  has been authorized for detection and/or diagnosis  of SARS-CoV-2 by FDA under an Emergency Use Authorization (EUA). This EUA will remain  in effect (meaning this test can be used) for the duration of the COVID-19 declaration under Section 564(b)(1) of the Act, 21 U.S.C.section 360bbb-3(b)(1), unless the authorization is terminated  or revoked sooner.       Influenza A  by PCR NEGATIVE NEGATIVE Final   Influenza B by PCR NEGATIVE NEGATIVE Final    Comment: (NOTE) The Xpert Xpress SARS-CoV-2/FLU/RSV plus assay is intended as an aid in the diagnosis of influenza from Nasopharyngeal swab specimens and should not be used as a sole basis for treatment. Nasal washings and aspirates are unacceptable for Xpert Xpress SARS-CoV-2/FLU/RSV testing.  Fact Sheet for Patients: BloggerCourse.com  Fact Sheet for Healthcare Providers: SeriousBroker.it  This test is not yet approved or cleared by the [place] FDA and has been authorized for detection and/or diagnosis of SARS-CoV-2 by FDA under an Emergency Use Authorization (EUA). This EUA will remain in effect (meaning this test can be used) for the duration of the COVID-19 declaration under Section 564(b)(1) of the Act, 21 U.S.C. section 360bbb-3(b)(1), unless the authorization is terminated or revoked.     Resp Syncytial Virus by PCR NEGATIVE NEGATIVE Final    Comment: (NOTE) Fact Sheet for Patients: BloggerCourse.com  Fact Sheet for Healthcare Providers: SeriousBroker.it  This test is not yet approved or cleared by the [place] FDA and has been authorized for detection and/or diagnosis of SARS-CoV-2 by FDA under an Emergency Use Authorization (EUA). This EUA will remain in effect (meaning this test can be used) for the duration of the COVID-19 declaration under Section 564(b)(1) of the Act, 21 U.S.C. section 360bbb-3(b)(1), unless the authorization is terminated or revoked.  Performed at [facility], [address] [place], [place] [address]   Urine Culture     Status: None   Collection Time: 06/29/23  8:23 PM   Specimen: Urine, Catheterized  Result Value Ref Range Status   Specimen Description   Final    URINE, CATHETERIZED Performed at [facility],  [address], [place], [place] [address]    Special Requests   Final    NONE Performed at [facility], [address], [place], [place] [address]    Culture   Final    NO GROWTH Performed at [facility], [address], [place], [place] [address]    Report Status 07/01/2023 FINAL  Final  Blood culture (routine x 2)     Status: None (Preliminary result)   Collection Time: 06/29/23  9:35 PM   Specimen: BLOOD  Result Value Ref Range Status   Specimen Description   Final    BLOOD BLOOD RIGHT FOREARM Performed at [facility], [address] [place], [place] [address]    Special Requests   Final    Blood Culture adequate volume BOTTLES DRAWN AEROBIC AND ANAEROBIC Performed at [facility], [address], [place], [place] [address]    Culture   Final    NO GROWTH 3 DAYS Performed at [facility], [address], [place], [place] [address]    Report Status PENDING  Incomplete  MRSA Next Gen by PCR, Nasal     Status: None   Collection Time: 06/30/23  1:20 PM   Specimen: Nasal Mucosa; Nasal Swab  Result Value Ref Range Status   MRSA by PCR Next Gen NOT DETECTED NOT DETECTED Final  Comment: (NOTE) The GeneXpert MRSA Assay (FDA approved for NASAL specimens only), is one component of a comprehensive MRSA colonization surveillance program. It is not intended to diagnose MRSA infection nor to guide or monitor treatment for MRSA infections. Test performance is not FDA approved in patients less than 16 years old. Performed at [facility], [address], [place], [place] [address]     FURTHER DISCHARGE INSTRUCTIONS:   Get Medicines reviewed and adjusted: Please take all your medications with you for your next visit with your Primary MD   Laboratory/radiological data: Please request your Primary MD to go over all hospital tests and procedure/radiological results at the follow up, please  ask your Primary MD to get all Hospital records sent to his/her office.   In some cases, they will be blood work, cultures and biopsy results pending at the time of your discharge. Please request that your primary care M.D. goes through all the records of your hospital data and follows up on these results.   Also Note the following: If you experience worsening of your admission symptoms, develop shortness of breath, life threatening emergency, suicidal or homicidal thoughts you must seek medical attention immediately by calling 911 or calling your MD immediately  if symptoms less severe.   You must read complete instructions/literature along with all the possible adverse reactions/side effects for all the Medicines you take and that have been prescribed to you. Take any new Medicines after you have completely understood and accpet all the possible adverse reactions/side effects.    Do not drive when taking Pain medications or sleeping medications (Benzodaizepines)   Do not take more than prescribed Pain, Sleep and Anxiety Medications. It is not advisable to combine anxiety,sleep and pain medications without talking with your primary care practitioner   Special Instructions: If you have smoked or chewed Tobacco  in the last 2 yrs please stop smoking, stop any regular Alcohol  and or any Recreational drug use.   Wear Seat belts while driving.   Please note: You were cared for by a hospitalist during your hospital stay. Once you are discharged, your primary care physician will handle any further medical issues. Please note that NO REFILLS for any discharge medications will be authorized once you are discharged, as it is imperative that you return to your primary care physician (or establish a relationship with a primary care physician if you do not have one) for your post hospital discharge needs so that they can reassess your need for medications and monitor your lab values  Time coordinating  discharge: Over 30 minutes  SIGNED:   Hughie Closs, MD  [facility] 07/03/2023, 8:52 AM *Please note that this is a verbal dictation therefore any spelling or grammatical errors are due to the "Dragon Medical One" system interpretation. If 7PM-7AM, please contact night-coverage [email or web address]

## 2023-07-03 NOTE — TOC Progression Note (Signed)
Transition of Care [facility]) - Progression Note    Patient Details  Name: Amanda Green MRN: [id number] Date of Birth: 1935-10-30  Transition [facility] CM/SW Contact  Beckie Busing, RN Phone Number:[phone]  07/03/2023, 9:32 AM  Clinical Narrative:    CM at bedside to discuss [facility] needs with daughter. Daughter has stepped out of room CM has attempted to call daughter Massie Bougie and call goes straight to voicemail. Message has been left will await return call. Bedside nurse made aware and asked to notify CM when daughter returns to bedside.    Expected Discharge Plan: Home w Home Health Services Barriers to Discharge: Continued Medical Work up  Expected Discharge Plan and Services   Discharge Planning Services: Follow-up appt scheduled Post Acute Care Choice: Home Health Living arrangements for the past 2 months: Single Family Home Expected Discharge Date: 07/03/23               DME Arranged:  (has DME)                     Social Determinants of Health (SDOH) Interventions SDOH Screenings   Food Insecurity: No Food Insecurity (06/29/2023)  Housing: Low Risk  (06/29/2023)  Transportation Needs: No Transportation Needs (06/29/2023)  Utilities: Not At Risk (06/29/2023)  Tobacco Use: Low Risk  (06/30/2023)    Readmission Risk Interventions    07/02/2023   12:00 PM 07/01/2023   12:14 PM 07/01/2023   12:11 PM  Readmission Risk Prevention Plan  Transportation Screening Complete    Medication Review (RN Care Manager) Referral to Pharmacy  Complete  PCP or Specialist appointment within 3-5 days of discharge Complete Not Complete   PCP/Specialist Appt Not Complete comments patient does have PCP and will follow up appt schediled with specialists   HRI or Home Care Consult Complete    HRI or Home Care Consult Pt Refusal Comments list provided for daughter    SW Recovery Care/Counseling Consult Complete Not Complete   Palliative Care Screening Complete    Skilled Nursing  Facility Patient Refused Not Complete

## 2023-07-04 LAB — CULTURE, BLOOD (ROUTINE X 2)
Culture: NO GROWTH
Culture: NO GROWTH
Special Requests: ADEQUATE

## 2023-07-07 DIAGNOSIS — E119 Type 2 diabetes mellitus without complications: Secondary | ICD-10-CM | POA: Diagnosis not present

## 2023-07-07 DIAGNOSIS — L89312 Pressure ulcer of right buttock, stage 2: Secondary | ICD-10-CM | POA: Diagnosis not present

## 2023-07-07 DIAGNOSIS — I110 Hypertensive heart disease with heart failure: Secondary | ICD-10-CM | POA: Diagnosis not present

## 2023-07-07 DIAGNOSIS — I480 Paroxysmal atrial fibrillation: Secondary | ICD-10-CM | POA: Diagnosis not present

## 2023-07-07 DIAGNOSIS — E8582 Wild-type transthyretin-related (ATTR) amyloidosis: Secondary | ICD-10-CM | POA: Diagnosis not present

## 2023-07-07 DIAGNOSIS — L89610 Pressure ulcer of right heel, unstageable: Secondary | ICD-10-CM | POA: Diagnosis not present

## 2023-07-07 DIAGNOSIS — Z466 Encounter for fitting and adjustment of urinary device: Secondary | ICD-10-CM | POA: Diagnosis not present

## 2023-07-07 DIAGNOSIS — I43 Cardiomyopathy in diseases classified elsewhere: Secondary | ICD-10-CM | POA: Diagnosis not present

## 2023-07-07 DIAGNOSIS — I5032 Chronic diastolic (congestive) heart failure: Secondary | ICD-10-CM | POA: Diagnosis not present

## 2023-07-08 ENCOUNTER — Other Ambulatory Visit: Payer: Self-pay | Admitting: Radiation Therapy

## 2023-07-08 DIAGNOSIS — C7931 Secondary malignant neoplasm of brain: Secondary | ICD-10-CM

## 2023-07-09 ENCOUNTER — Encounter: Payer: Self-pay | Admitting: Hematology and Oncology

## 2023-07-09 ENCOUNTER — Telehealth: Payer: Self-pay | Admitting: Hematology and Oncology

## 2023-07-09 NOTE — Telephone Encounter (Signed)
Scheduled appointment per WQ. Talked with the patients daughter Massie Bougie and she is aware of the made appointment. Next appt scheduled for 7/30 for herceptin infusion.

## 2023-07-10 ENCOUNTER — Encounter: Payer: Self-pay | Admitting: Hematology and Oncology

## 2023-07-10 DIAGNOSIS — E119 Type 2 diabetes mellitus without complications: Secondary | ICD-10-CM | POA: Diagnosis not present

## 2023-07-10 DIAGNOSIS — L89312 Pressure ulcer of right buttock, stage 2: Secondary | ICD-10-CM | POA: Diagnosis not present

## 2023-07-10 DIAGNOSIS — I110 Hypertensive heart disease with heart failure: Secondary | ICD-10-CM | POA: Diagnosis not present

## 2023-07-10 DIAGNOSIS — I43 Cardiomyopathy in diseases classified elsewhere: Secondary | ICD-10-CM | POA: Diagnosis not present

## 2023-07-10 DIAGNOSIS — I480 Paroxysmal atrial fibrillation: Secondary | ICD-10-CM | POA: Diagnosis not present

## 2023-07-10 DIAGNOSIS — Z466 Encounter for fitting and adjustment of urinary device: Secondary | ICD-10-CM | POA: Diagnosis not present

## 2023-07-10 DIAGNOSIS — E8582 Wild-type transthyretin-related (ATTR) amyloidosis: Secondary | ICD-10-CM | POA: Diagnosis not present

## 2023-07-10 DIAGNOSIS — L89610 Pressure ulcer of right heel, unstageable: Secondary | ICD-10-CM | POA: Diagnosis not present

## 2023-07-10 DIAGNOSIS — I5032 Chronic diastolic (congestive) heart failure: Secondary | ICD-10-CM | POA: Diagnosis not present

## 2023-07-11 ENCOUNTER — Other Ambulatory Visit: Payer: Self-pay

## 2023-07-11 ENCOUNTER — Inpatient Hospital Stay (HOSPITAL_COMMUNITY)
Admission: EM | Admit: 2023-07-11 | Discharge: 2023-07-18 | DRG: 698 | Disposition: A | Discharge status: Hospice | Payer: Medicare PPO | Attending: Internal Medicine | Admitting: Internal Medicine

## 2023-07-11 ENCOUNTER — Emergency Department (HOSPITAL_COMMUNITY): Payer: Medicare PPO

## 2023-07-11 ENCOUNTER — Telehealth: Payer: Self-pay | Admitting: Radiation Therapy

## 2023-07-11 ENCOUNTER — Encounter (HOSPITAL_COMMUNITY): Payer: Self-pay | Admitting: Emergency Medicine

## 2023-07-11 ENCOUNTER — Other Ambulatory Visit: Payer: Self-pay | Admitting: Hematology and Oncology

## 2023-07-11 DIAGNOSIS — D0512 Intraductal carcinoma in situ of left breast: Secondary | ICD-10-CM | POA: Diagnosis present

## 2023-07-11 DIAGNOSIS — G9340 Encephalopathy, unspecified: Principal | ICD-10-CM

## 2023-07-11 DIAGNOSIS — L89152 Pressure ulcer of sacral region, stage 2: Secondary | ICD-10-CM | POA: Diagnosis present

## 2023-07-11 DIAGNOSIS — F0390 Unspecified dementia without behavioral disturbance: Secondary | ICD-10-CM | POA: Diagnosis present

## 2023-07-11 DIAGNOSIS — E860 Dehydration: Secondary | ICD-10-CM | POA: Diagnosis present

## 2023-07-11 DIAGNOSIS — L89610 Pressure ulcer of right heel, unstageable: Secondary | ICD-10-CM | POA: Diagnosis not present

## 2023-07-11 DIAGNOSIS — I110 Hypertensive heart disease with heart failure: Secondary | ICD-10-CM | POA: Diagnosis not present

## 2023-07-11 DIAGNOSIS — C787 Secondary malignant neoplasm of liver and intrahepatic bile duct: Secondary | ICD-10-CM | POA: Diagnosis present

## 2023-07-11 DIAGNOSIS — C7931 Secondary malignant neoplasm of brain: Secondary | ICD-10-CM | POA: Diagnosis present

## 2023-07-11 DIAGNOSIS — E162 Hypoglycemia, unspecified: Secondary | ICD-10-CM

## 2023-07-11 DIAGNOSIS — E859 Amyloidosis, unspecified: Secondary | ICD-10-CM | POA: Diagnosis not present

## 2023-07-11 DIAGNOSIS — Z1152 Encounter for screening for COVID-19: Secondary | ICD-10-CM

## 2023-07-11 DIAGNOSIS — E119 Type 2 diabetes mellitus without complications: Secondary | ICD-10-CM | POA: Diagnosis not present

## 2023-07-11 DIAGNOSIS — Y846 Urinary catheterization as the cause of abnormal reaction of the patient, or of later complication, without mention of misadventure at the time of the procedure: Secondary | ICD-10-CM | POA: Diagnosis present

## 2023-07-11 DIAGNOSIS — L89312 Pressure ulcer of right buttock, stage 2: Secondary | ICD-10-CM | POA: Diagnosis not present

## 2023-07-11 DIAGNOSIS — E785 Hyperlipidemia, unspecified: Secondary | ICD-10-CM | POA: Diagnosis present

## 2023-07-11 DIAGNOSIS — R2689 Other abnormalities of gait and mobility: Secondary | ICD-10-CM | POA: Diagnosis not present

## 2023-07-11 DIAGNOSIS — J189 Pneumonia, unspecified organism: Secondary | ICD-10-CM | POA: Diagnosis not present

## 2023-07-11 DIAGNOSIS — G936 Cerebral edema: Secondary | ICD-10-CM | POA: Diagnosis present

## 2023-07-11 DIAGNOSIS — E8582 Wild-type transthyretin-related (ATTR) amyloidosis: Secondary | ICD-10-CM | POA: Diagnosis not present

## 2023-07-11 DIAGNOSIS — L89613 Pressure ulcer of right heel, stage 3: Secondary | ICD-10-CM | POA: Diagnosis not present

## 2023-07-11 DIAGNOSIS — G9341 Metabolic encephalopathy: Secondary | ICD-10-CM

## 2023-07-11 DIAGNOSIS — Z66 Do not resuscitate: Secondary | ICD-10-CM | POA: Diagnosis not present

## 2023-07-11 DIAGNOSIS — T83511A Infection and inflammatory reaction due to indwelling urethral catheter, initial encounter: Secondary | ICD-10-CM | POA: Diagnosis not present

## 2023-07-11 DIAGNOSIS — N390 Urinary tract infection, site not specified: Secondary | ICD-10-CM

## 2023-07-11 DIAGNOSIS — Z79899 Other long term (current) drug therapy: Secondary | ICD-10-CM

## 2023-07-11 DIAGNOSIS — Z6824 Body mass index (BMI) 24.0-24.9, adult: Secondary | ICD-10-CM

## 2023-07-11 DIAGNOSIS — I959 Hypotension, unspecified: Secondary | ICD-10-CM | POA: Diagnosis present

## 2023-07-11 DIAGNOSIS — E11649 Type 2 diabetes mellitus with hypoglycemia without coma: Secondary | ICD-10-CM | POA: Diagnosis not present

## 2023-07-11 DIAGNOSIS — Z515 Encounter for palliative care: Secondary | ICD-10-CM | POA: Diagnosis not present

## 2023-07-11 DIAGNOSIS — K7290 Hepatic failure, unspecified without coma: Secondary | ICD-10-CM | POA: Diagnosis present

## 2023-07-11 DIAGNOSIS — Z7401 Bed confinement status: Secondary | ICD-10-CM | POA: Diagnosis not present

## 2023-07-11 DIAGNOSIS — Z888 Allergy status to other drugs, medicaments and biological substances status: Secondary | ICD-10-CM

## 2023-07-11 DIAGNOSIS — I480 Paroxysmal atrial fibrillation: Secondary | ICD-10-CM | POA: Diagnosis not present

## 2023-07-11 DIAGNOSIS — Z466 Encounter for fitting and adjustment of urinary device: Secondary | ICD-10-CM | POA: Diagnosis not present

## 2023-07-11 DIAGNOSIS — N179 Acute kidney failure, unspecified: Secondary | ICD-10-CM | POA: Diagnosis present

## 2023-07-11 DIAGNOSIS — K7682 Hepatic encephalopathy: Secondary | ICD-10-CM | POA: Diagnosis present

## 2023-07-11 DIAGNOSIS — I5032 Chronic diastolic (congestive) heart failure: Secondary | ICD-10-CM | POA: Diagnosis not present

## 2023-07-11 DIAGNOSIS — E1165 Type 2 diabetes mellitus with hyperglycemia: Secondary | ICD-10-CM | POA: Diagnosis present

## 2023-07-11 DIAGNOSIS — R339 Retention of urine, unspecified: Secondary | ICD-10-CM | POA: Diagnosis present

## 2023-07-11 DIAGNOSIS — I43 Cardiomyopathy in diseases classified elsewhere: Secondary | ICD-10-CM | POA: Diagnosis not present

## 2023-07-11 DIAGNOSIS — Z96652 Presence of left artificial knee joint: Secondary | ICD-10-CM | POA: Diagnosis present

## 2023-07-11 DIAGNOSIS — T83518A Infection and inflammatory reaction due to other urinary catheter, initial encounter: Principal | ICD-10-CM | POA: Diagnosis present

## 2023-07-11 DIAGNOSIS — Z9013 Acquired absence of bilateral breasts and nipples: Secondary | ICD-10-CM

## 2023-07-11 DIAGNOSIS — Z8042 Family history of malignant neoplasm of prostate: Secondary | ICD-10-CM

## 2023-07-11 DIAGNOSIS — Z853 Personal history of malignant neoplasm of breast: Secondary | ICD-10-CM

## 2023-07-11 DIAGNOSIS — Z923 Personal history of irradiation: Secondary | ICD-10-CM

## 2023-07-11 DIAGNOSIS — Z90710 Acquired absence of both cervix and uterus: Secondary | ICD-10-CM

## 2023-07-11 DIAGNOSIS — Z8673 Personal history of transient ischemic attack (TIA), and cerebral infarction without residual deficits: Secondary | ICD-10-CM

## 2023-07-11 DIAGNOSIS — R627 Adult failure to thrive: Secondary | ICD-10-CM | POA: Diagnosis present

## 2023-07-11 LAB — CBC WITH DIFFERENTIAL/PLATELET
Abs Immature Granulocytes: 0.31 10*3/uL — ABNORMAL HIGH (ref 0.00–0.07)
Basophils Absolute: 0 10*3/uL (ref 0.0–0.1)
Basophils Relative: 0 %
Eosinophils Absolute: 0.2 10*3/uL (ref 0.0–0.5)
Eosinophils Relative: 2 %
HCT: 32.8 % — ABNORMAL LOW (ref 36.0–46.0)
Hemoglobin: 10.8 g/dL — ABNORMAL LOW (ref 12.0–15.0)
Immature Granulocytes: 3 %
Lymphocytes Relative: 11 %
Lymphs Abs: 1 10*3/uL (ref 0.7–4.0)
MCH: 28.8 pg (ref 26.0–34.0)
MCHC: 32.9 g/dL (ref 30.0–36.0)
MCV: 87.5 fL (ref 80.0–100.0)
Monocytes Absolute: 0.5 10*3/uL (ref 0.1–1.0)
Monocytes Relative: 6 %
Neutro Abs: 7.1 10*3/uL (ref 1.7–7.7)
Neutrophils Relative %: 78 %
Platelets: 419 10*3/uL — ABNORMAL HIGH (ref 150–400)
RBC: 3.75 MIL/uL — ABNORMAL LOW (ref 3.87–5.11)
RDW: 19.1 % — ABNORMAL HIGH (ref 11.5–15.5)
WBC: 9.1 10*3/uL (ref 4.0–10.5)
nRBC: 0 % (ref 0.0–0.2)

## 2023-07-11 LAB — CBG MONITORING, ED
Glucose-Capillary: 136 mg/dL — ABNORMAL HIGH (ref 70–99)
Glucose-Capillary: 60 mg/dL — ABNORMAL LOW (ref 70–99)
Glucose-Capillary: 112 mg/dL — ABNORMAL HIGH (ref 70–99)
Glucose-Capillary: 106 mg/dL — ABNORMAL HIGH (ref 70–99)

## 2023-07-11 LAB — URINALYSIS, W/ REFLEX TO CULTURE (INFECTION SUSPECTED)
Bilirubin Urine: NEGATIVE
Glucose, UA: NEGATIVE mg/dL
Hgb urine dipstick: NEGATIVE
Ketones, ur: 5 mg/dL — AB
Nitrite: NEGATIVE
Protein, ur: NEGATIVE mg/dL
Specific Gravity, Urine: 1.011 (ref 1.005–1.030)
WBC, UA: >50 WBC/hpf (ref 0–5)
pH: 5 (ref 5.0–8.0)

## 2023-07-11 LAB — COMPREHENSIVE METABOLIC PANEL
ALT: 48 U/L — ABNORMAL HIGH (ref 0–44)
AST: 163 U/L — ABNORMAL HIGH (ref 15–41)
Albumin: 2.3 g/dL — ABNORMAL LOW (ref 3.5–5.0)
Alkaline Phosphatase: 571 U/L — ABNORMAL HIGH (ref 38–126)
Anion gap: 15 (ref 5–15)
BUN: 32 mg/dL — ABNORMAL HIGH (ref 8–23)
CO2: 22 mmol/L (ref 22–32)
Calcium: 10.6 mg/dL — ABNORMAL HIGH (ref 8.9–10.3)
Chloride: 96 mmol/L — ABNORMAL LOW (ref 98–111)
Creatinine, Ser: 0.71 mg/dL (ref 0.44–1.00)
GFR, Estimated: >60 mL/min (ref >60)
Glucose, Bld: 58 mg/dL — ABNORMAL LOW (ref 70–99)
Potassium: 3.2 mmol/L — ABNORMAL LOW (ref 3.5–5.1)
Sodium: 133 mmol/L — ABNORMAL LOW (ref 135–145)
Total Bilirubin: 1.7 mg/dL — ABNORMAL HIGH (ref 0.3–1.2)
Total Protein: 6.3 g/dL — ABNORMAL LOW (ref 6.5–8.1)

## 2023-07-11 LAB — GLUCOSE, CAPILLARY: Glucose-Capillary: 120 mg/dL — ABNORMAL HIGH (ref 70–99)

## 2023-07-11 LAB — SARS CORONAVIRUS 2 BY RT PCR: SARS Coronavirus 2 by RT PCR: NEGATIVE

## 2023-07-11 MED ORDER — ACETAMINOPHEN 325 MG PO TABS: 325.0000 mg | ORAL_TABLET | Freq: Four times a day (QID) | ORAL | Status: DC | PRN

## 2023-07-11 MED ORDER — DEXTROSE 50 % IV SOLN: 50.0000 mL | Freq: Once | INTRAVENOUS | Status: AC

## 2023-07-11 MED ORDER — SODIUM CHLORIDE 0.9 % IV BOLUS
1000.0000 mL | Freq: Once | INTRAVENOUS | Status: AC
  Administered 2023-07-11: 1000 mL via INTRAVENOUS

## 2023-07-11 MED ORDER — SODIUM CHLORIDE 0.9 % IV SOLN
1.0000 g | Freq: Once | INTRAVENOUS | Status: AC
  Administered 2023-07-11: 1 g via INTRAVENOUS
  Filled 2023-07-11: qty 10

## 2023-07-11 MED ORDER — MELATONIN 5 MG PO TABS
5.0000 mg | ORAL_TABLET | Freq: Every evening | ORAL | Status: DC | PRN
  Administered 2023-07-13: 5 mg via ORAL
  Filled 2023-07-11: qty 1

## 2023-07-11 MED ORDER — POLYETHYLENE GLYCOL 3350 17 G PO PACK: 17.0000 g | PACK | Freq: Every day | ORAL | Status: DC | PRN

## 2023-07-11 MED ORDER — PROCHLORPERAZINE EDISYLATE 10 MG/2ML IJ SOLN: 5.0000 mg | Freq: Four times a day (QID) | INTRAMUSCULAR | Status: DC | PRN

## 2023-07-11 MED ORDER — KCL IN DEXTROSE-NACL 40-5-0.9 MEQ/L-%-% IV SOLN
INTRAVENOUS | Status: AC
  Filled 2023-07-11 (×2): qty 1000

## 2023-07-11 MED ORDER — DEXTROSE 50 % IV SOLN
INTRAVENOUS | Status: AC
  Administered 2023-07-11: 50 mL via INTRAVENOUS
  Filled 2023-07-11: qty 50

## 2023-07-11 MED ORDER — ENOXAPARIN SODIUM 40 MG/0.4ML IJ SOSY
40.0000 mg | PREFILLED_SYRINGE | INTRAMUSCULAR | Status: DC
  Administered 2023-07-11 – 2023-07-13 (×3): 40 mg via SUBCUTANEOUS
  Filled 2023-07-11 (×3): qty 0.4

## 2023-07-11 NOTE — ED Notes (Signed)
ED TO INPATIENT HANDOFF REPORT  ED Nurse Name and Phone #: [phone]  S Name/Age/Gender Amanda Green 88 y.o. female Room/Bed: WA25/WA25  Code Status   Code Status: DNR  Home/SNF/Other Home Patient oriented to: self and place Is this baseline? Yes   Triage Complete: Triage complete  Chief Complaint Acute metabolic encephalopathy [G93.41]  Triage Note Patient BIB EMS from home c/o hypoglycemia. Initial cbg was 40.  Patient had 2 oral glucose P.O. and 1mg  glucagon IM given by EMS.  No report of N/V.   BP 156/86 HR 66 RR 20 97% on RA CBG 67    Allergies Allergies  Allergen Reactions   Neurontin [Gabapentin] Other (See Comments)    Drowsiness    Level of Care/Admitting Diagnosis ED Disposition     ED Disposition  Admit   Condition  --   Comment  Hospital Area: [facility] [[id number]]  Level of Care: Progressive [102]  Admit to Progressive based on following criteria: GI, ENDOCRINE disease patients with GI bleeding, acute liver failure or pancreatitis, stable with diabetic ketoacidosis or thyrotoxicosis (hypothyroid) state.  May admit patient to [facility] or [facility] if equivalent level of care is available:: No  Covid Evaluation: Asymptomatic - no recent exposure (last 10 days) testing not required  Diagnosis: Acute metabolic encephalopathy [[id number]]  Admitting Physician: Darlin Drop [[id number]]  Attending Physician: Darlin Drop [[id number]]  Certification:: I certify this patient will need inpatient services for at least 2 midnights  Estimated Length of Stay: 2          B Medical/Surgery History Past Medical History:  Diagnosis Date   Arthritis    knees,   Breast cancer (HCC) 1999   right mastectomy   Breast cancer, left (HCC) 05/2019   left lumpectomy   Cerebrovascular accident (CVA) (HCC) 02/07/2021   Diabetes mellitus    Dislocation of left shoulder joint    Family history of prostate cancer    Hyperlipidemia     Hypertension    Memory loss    Numbness and tingling of right lower extremity    Paresthesia    Stroke (HCC)    light stroke - 2012 ,right leg nerve damage    Past Surgical History:  Procedure Laterality Date   ABDOMINAL HYSTERECTOMY     APPENDECTOMY     BREAST BIOPSY Left 01/19/2010   BREAST LUMPECTOMY WITH RADIOACTIVE SEED LOCALIZATION Left 07/17/2019   Procedure: LEFT BREAST LUMPECTOMY WITH RADIOACTIVE SEED LOCALIZATION;  Surgeon: Luretha Murphy, MD;  Location: [facility];  Service: General;  Laterality: Left;   BREAST SURGERY     Right mastectomy 1999   CARPAL TUNNEL RELEASE Right 09/30/2014   Procedure: RIGHT CARPAL TUNNEL RELEASE;  Surgeon: Cindee Salt, MD;  Location: [facility];  Service: Orthopedics;  Laterality: Right;   COLONOSCOPY     EYE SURGERY     cataracts   JOINT REPLACEMENT     lt total knee 2011   LIPOMA EXCISION  11/28/2011   Procedure: EXCISION LIPOMA;  Surgeon: Valarie Merino, MD;  Location:  SURGERY CENTER;  Service: General;  Laterality: N/A;  excisino lipoma 4 cm back of neck   MASTECTOMY Right 1999   TOTAL MASTECTOMY Left 05/10/2021   Procedure: LEFT TOTAL MASTECTOMY;  Surgeon: Luretha Murphy, MD;  Location: [facility];  Service: General;  Laterality: Left;     A IV Location/Drains/Wounds Patient Lines/Drains/Airways Status     Active Line/Drains/Airways  Name Placement date Placement time Site Days   Peripheral IV 07/11/23 20 G Anterior;Left Forearm 07/11/23  1857  Forearm  less than 1   Urethral Catheter Sherol Dade, RN Straight-tip 16 Fr. 06/21/23  1516  Straight-tip  20   Pressure Injury 03/09/23 Buttocks Right Stage 2 -  Partial thickness loss of dermis presenting as a shallow open injury with a red, pink wound bed without slough. less than 1cm size. pink/red 03/09/23  1854  -- 124   Pressure Injury 06/22/23 Heel Right Stage 3 -  Full thickness tissue loss. Subcutaneous fat may be visible but bone,  tendon or muscle are NOT exposed. 06/22/23  0925  -- 19   Pressure Injury 06/29/23 Sacrum Right;Left;Medial Stage 2 -  Partial thickness loss of dermis presenting as a shallow open injury with a red, pink wound bed without slough. 06/29/23  2340  -- 12            Intake/Output Last 24 hours  Intake/Output Summary (Last 24 hours) at 07/11/2023 2256 Last data filed at 07/11/2023 2227 Gross per 24 hour  Intake 1000 ml  Output --  Net 1000 ml    Labs/Imaging Results for orders placed or performed during the hospital encounter of 07/11/23 (from the past 48 hour(s))  CBG monitoring, ED     Status: Abnormal   Collection Time: 07/11/23  6:44 PM  Result Value Ref Range   Glucose-Capillary 60 (L) 70 - 99 mg/dL    Comment: Glucose reference range applies only to samples taken after fasting for at least 8 hours.  CBC with Differential     Status: Abnormal   Collection Time: 07/11/23  6:56 PM  Result Value Ref Range   WBC 9.1 4.0 - 10.5 K/uL   RBC 3.75 (L) 3.87 - 5.11 MIL/uL   Hemoglobin 10.8 (L) 12.0 - 15.0 g/dL   HCT 13.2 (L) 44.0 - 10.2 %   MCV 87.5 80.0 - 100.0 fL   MCH 28.8 26.0 - 34.0 pg   MCHC 32.9 30.0 - 36.0 g/dL   RDW 72.5 (H) 36.6 - 44.0 %   Platelets 419 (H) 150 - 400 K/uL   nRBC 0.0 0.0 - 0.2 %   Neutrophils Relative % 78 %   Neutro Abs 7.1 1.7 - 7.7 K/uL   Lymphocytes Relative 11 %   Lymphs Abs 1.0 0.7 - 4.0 K/uL   Monocytes Relative 6 %   Monocytes Absolute 0.5 0.1 - 1.0 K/uL   Eosinophils Relative 2 %   Eosinophils Absolute 0.2 0.0 - 0.5 K/uL   Basophils Relative 0 %   Basophils Absolute 0.0 0.0 - 0.1 K/uL   Immature Granulocytes 3 %   Abs Immature Granulocytes 0.31 (H) 0.00 - 0.07 K/uL    Comment: Performed at [facility], [address] [place], [place] [address]  Comprehensive metabolic panel     Status: Abnormal   Collection Time: 07/11/23  6:56 PM  Result Value Ref Range   Sodium 133 (L) 135 - 145 mmol/L   Potassium 3.2 (L) 3.5 - 5.1  mmol/L   Chloride 96 (L) 98 - 111 mmol/L   CO2 22 22 - 32 mmol/L   Glucose, Bld 58 (L) 70 - 99 mg/dL    Comment: Glucose reference range applies only to samples taken after fasting for at least 8 hours.   BUN 32 (H) 8 - 23 mg/dL   Creatinine, Ser 5.95 0.44 - 1.00 mg/dL   Calcium 63.8 (H)  8.9 - 10.3 mg/dL   Total Protein 6.3 (L) 6.5 - 8.1 g/dL   Albumin 2.3 (L) 3.5 - 5.0 g/dL   AST 440 (H) 15 - 41 U/L   ALT 48 (H) 0 - 44 U/L   Alkaline Phosphatase 571 (H) 38 - 126 U/L   Total Bilirubin 1.7 (H) 0.3 - 1.2 mg/dL   GFR, Estimated >10 >27 mL/min    Comment: (NOTE) Calculated using the CKD-EPI Creatinine Equation (2021)    Anion gap 15 5 - 15    Comment: Performed at [facility], [address], [place], [place] [address]  CBG monitoring, ED     Status: Abnormal   Collection Time: 07/11/23  7:19 PM  Result Value Ref Range   Glucose-Capillary 136 (H) 70 - 99 mg/dL    Comment: Glucose reference range applies only to samples taken after fasting for at least 8 hours.  Urinalysis, w/ Reflex to Culture (Infection Suspected) -Urine, Clean Catch     Status: Abnormal   Collection Time: 07/11/23  7:20 PM  Result Value Ref Range   Specimen Source URINE, CLEAN CATCH    Color, Urine YELLOW YELLOW   APPearance HAZY (A) CLEAR   Specific Gravity, Urine 1.011 1.005 - 1.030   pH 5.0 5.0 - 8.0   Glucose, UA NEGATIVE NEGATIVE mg/dL   Hgb urine dipstick NEGATIVE NEGATIVE   Bilirubin Urine NEGATIVE NEGATIVE   Ketones, ur 5 (A) NEGATIVE mg/dL   Protein, ur NEGATIVE NEGATIVE mg/dL   Nitrite NEGATIVE NEGATIVE   Leukocytes,Ua LARGE (A) NEGATIVE   RBC / HPF 0-5 0 - 5 RBC/hpf   WBC, UA >50 0 - 5 WBC/hpf    Comment:        Reflex urine culture not performed if WBC <=10, OR if Squamous epithelial cells >5. If Squamous epithelial cells >5 suggest recollection.    Bacteria, UA RARE (A) NONE SEEN   Squamous Epithelial / HPF 0-5 0 - 5 /HPF   Budding Yeast PRESENT    Hyaline Casts, UA  PRESENT     Comment: Performed at [facility], [address] [place], [place] [address]  SARS Coronavirus 2 by RT PCR (hospital order, performed in [facility] hospital lab) *cepheid single result test* Anterior Nasal Swab     Status: None   Collection Time: 07/11/23  7:40 PM   Specimen: Anterior Nasal Swab  Result Value Ref Range   SARS Coronavirus 2 by RT PCR NEGATIVE NEGATIVE    Comment: (NOTE) SARS-CoV-2 target nucleic acids are NOT DETECTED.  The SARS-CoV-2 RNA is generally detectable in upper and lower respiratory specimens during the acute phase of infection. The lowest concentration of SARS-CoV-2 viral copies this assay can detect is 250 copies / mL. A negative result does not preclude SARS-CoV-2 infection and should not be used as the sole basis for treatment or other patient management decisions.  A negative result may occur with improper specimen collection / handling, submission of specimen other than nasopharyngeal swab, presence of viral mutation(s) within the areas targeted by this assay, and inadequate number of viral copies (<250 copies / mL). A negative result must be combined with clinical observations, patient history, and epidemiological information.  Fact Sheet for Patients:   RoadLapTop.co.za  Fact Sheet for Healthcare Providers: http://kim-miller.com/  This test is not yet approved or  cleared by the [place] FDA and has been authorized for detection and/or diagnosis of SARS-CoV-2 by FDA under an Emergency Use Authorization (  EUA).  This EUA will remain in effect (meaning this test can be used) for the duration of the COVID-19 declaration under Section 564(b)(1) of the Act, 21 U.S.C. section 360bbb-3(b)(1), unless the authorization is terminated or revoked sooner.  Performed at [facility], [address], [place], [place] [address]   CBG monitoring, ED     Status:  Abnormal   Collection Time: 07/11/23  8:35 PM  Result Value Ref Range   Glucose-Capillary 112 (H) 70 - 99 mg/dL    Comment: Glucose reference range applies only to samples taken after fasting for at least 8 hours.  CBG monitoring, ED     Status: Abnormal   Collection Time: 07/11/23  9:42 PM  Result Value Ref Range   Glucose-Capillary 106 (H) 70 - 99 mg/dL    Comment: Glucose reference range applies only to samples taken after fasting for at least 8 hours.   CT HEAD WO CONTRAST ( )  Result Date: 07/11/2023 CLINICAL DATA:  Mental status change.  History of brain metastases. EXAM: CT HEAD WITHOUT CONTRAST TECHNIQUE: Contiguous axial images were obtained from the base of the skull through the vertex without intravenous contrast. RADIATION DOSE REDUCTION: This exam was performed according to the departmental dose-optimization program which includes automated exposure control, adjustment of the mA and/or kV according to patient size and/or use of iterative reconstruction technique. COMPARISON:  MRI brain 06/22/2023.  CT head 06/21/2023. FINDINGS: Brain: Heterogeneous lesion in the left occipital lobe measures proximally 2.0 x 1.5 cm similar to the prior study. There is surrounding vasogenic edema, also unchanged. Mild periventricular white matter hypodensity persists and appears unchanged. No new focal areas of edema identified. No acute intracranial hemorrhage, extra-axial fluid collection or midline shift. No hydrocephalus. Oval density in the right lateral ventricle measuring 6 x 4 mm appears unchanged. Vascular: Atherosclerotic calcifications are present within the cavernous internal carotid arteries. Skull: Normal. Negative for fracture or focal lesion. Sinuses/Orbits: No acute finding. Other: None. IMPRESSION: 1. No acute intracranial process. 2. Stable left occipital lobe lesion with surrounding vasogenic edema. 3. Stable periventricular white matter hypodensity, likely chronic small vessel ischemic  changes. 4. Stable small mass in the right lateral ventricle. Electronically Signed   By: Darliss Cheney M.D.   On: 07/11/2023 20:16   DG Chest Port 1 View  Result Date: 07/11/2023 CLINICAL DATA:  Cough. EXAM: PORTABLE CHEST 1 VIEW COMPARISON:  Chest radiograph dated 06/29/2023. FINDINGS: Small bilateral pleural effusions, left greater right relatively similar to or minimally increased since the prior radiograph. There are bibasilar atelectasis. Pneumonia is not excluded. No pneumothorax. Stable cardiac silhouette. Atherosclerotic calcification of the aorta. Degenerative changes of the spine. No acute osseous pathology. IMPRESSION: No significant interval change since the prior radiograph. Electronically Signed   By: Elgie Collard M.D.   On: 07/11/2023 20:01    Pending Labs Unresulted Labs (From admission, onward)     Start     Ordered   07/18/23 0500  Creatinine, serum  (enoxaparin (LOVENOX)    CrCl >/= 30 ml/min)  Weekly,   R     Comments: while on enoxaparin therapy    07/11/23 2201   07/12/23 0500  CBC  Tomorrow morning,   R        07/11/23 2205   07/12/23 0500  Comprehensive metabolic panel  Tomorrow morning,   R        07/11/23 2205   07/12/23 0500  Magnesium  Tomorrow morning,   R  07/11/23 2205   07/12/23 0500  Phosphorus  Tomorrow morning,   R        07/11/23 2205   07/11/23 1920  Urine Culture  Once,   R        07/11/23 1920            Vitals/Pain Today's Vitals   07/11/23 1916 07/11/23 2030 07/11/23 2100 07/11/23 2200  BP: (!) 157/67 (!) 142/64 135/68 (!) 109/54  Pulse: 66 (!) 58 62 60  Resp: 15 18 19 15   Temp: 97.8 F (36.6 C)     TempSrc: Oral     SpO2: 98% 95% 97% 99%  PainSc: 0-No pain       Isolation Precautions No active isolations  Medications Medications  cefTRIAXone (ROCEPHIN) 1 g in sodium chloride 0.9 % 100 mL IVPB (1 g Intravenous New Bag/Given 07/11/23 2232)  enoxaparin (LOVENOX) injection 40 mg (40 mg Subcutaneous Given 07/11/23 2228)   dextrose 5 % and 0.9 % NaCl with KCl 40 mEq/L infusion (has no administration in time range)  prochlorperazine (COMPAZINE) injection 5 mg (has no administration in time range)  polyethylene glycol (MIRALAX / GLYCOLAX) packet 17 g (has no administration in time range)  melatonin tablet 5 mg (has no administration in time range)  acetaminophen (TYLENOL) tablet 325 mg (has no administration in time range)  dextrose 50 % solution 50 mL (50 mLs Intravenous Given 07/11/23 1903)  sodium chloride 0.9 % bolus 1,000 mL (0 mLs Intravenous Stopped 07/11/23 2227)    Mobility non-ambulatory     Focused Assessments See chart   R Recommendations: See Admitting Provider Note  Report given to:   Additional Notes: Pt is bedbound and drowsy but easily aroused with verbal stimuli. She is oriented to person and place only per baseline. She lives with daughters, who are at bedside, and has refused to eat x 2 days.

## 2023-07-11 NOTE — ED Notes (Signed)
Hospitalist at bedside 

## 2023-07-11 NOTE — ED Provider Notes (Signed)
[facility] AT [facility] Provider Note   CSN: [id number] Arrival date & time: 07/11/23  1825     History  Chief Complaint  Patient presents with   Hypoglycemia    Amanda Green is a 88 y.o. female history of metastatic breast cancer to brain, chronic indwelling Foley, previous stroke, here presenting with altered mental status.  Patient lives at home with her daughters.  Patient stopped eating today.  Patient was noted to be progressively more confused.  She also has nonproductive cough as well.  EMS was called and patient's glucose was 40.  EMS attempted IV but was unsuccessful.  Patient also was given oral glucose and IM glucagon prior to arrival.  Patient unable to give any history.  The history is provided by a relative and the EMS personnel.  Level V caveat- AMS      Home Medications Prior to Admission medications   Medication Sig Start Date End Date Taking? Authorizing Provider  acetaminophen (TYLENOL) 650 MG CR tablet Take 650-1,300 mg by mouth 2 (two) times daily as needed for pain.    [provider]  albuterol (PROVENTIL) (2.5 MG/3ML) 0.083% nebulizer solution Take 2.5 mg by nebulization 2 (two) times daily as needed for wheezing or shortness of breath.    [provider]  amiodarone (PACERONE) 100 MG tablet Take 1 tablet (100 mg total) by mouth daily. 06/19/23   Cannon Kettle, PA-C  ASCORBIC ACID PO Take 1 tablet by mouth daily. Vitamin C, unknown strength.    [provider]  brimonidine (ALPHAGAN) 0.2 % ophthalmic solution Place 1 drop into both eyes daily.    [provider]  Cyanocobalamin (VITAMIN B-12 PO) Take 1 tablet by mouth daily.    [provider]  diclofenac Sodium (VOLTAREN) 1 % GEL Apply 1 Application topically 4 (four) times daily as needed (joint pain).    [provider]  dorzolamide (TRUSOPT) 2 % ophthalmic solution Place 1 drop into both eyes 2 (two) times  daily. 10/19/20   [provider]  furosemide (LASIX) 20 MG tablet Take 1 tablet (20 mg total) by mouth daily. 03/26/23 08/24/23  Arrien, York Ram, MD  hydrocerin (EUCERIN) CREA Apply 1 Application topically daily. Apply to left leg and heel once daily, do not apply between digits 06/23/23   Atway, Rayann N, DO  hydrochlorothiazide (HYDRODIURIL) 12.5 MG tablet Take 12.5 mg by mouth daily.    [provider]  leptospermum manuka honey (MEDIHONEY) PSTE paste Apply 1 Application topically daily. Apply to right heel Unstageable PI and sacral stage 2 PI after cleansing Apply thin layer (3 mm) to wound. 06/23/23 07/23/23  Atway, Rayann N, DO  losartan (COZAAR) 25 MG tablet Take 1 tablet (25 mg total) by mouth daily. 03/26/23 08/24/23  Arrien, York Ram, MD  MAGNESIUM OXIDE PO Take 1 tablet by mouth daily.    [provider]  memantine (NAMENDA) 10 MG tablet Take 1 tablet (10 mg total) by mouth 2 (two) times daily. 02/04/23   Ihor Austin, NP  metoprolol tartrate (LOPRESSOR) 50 MG tablet Take 1 tablet (50 mg total) by mouth 2 (two) times daily. 06/19/23   Cannon Kettle, PA-C  Multiple Vitamins-Minerals (MULTIVITAMIN WOMEN 50+) TABS Take 1 tablet by mouth daily.    [provider]  spironolactone (ALDACTONE) 25 MG tablet Take 1 tablet (25 mg total) by mouth daily. 06/19/23 09/17/23  Cannon Kettle, PA-C  Tafamidis (VYNDAMAX) 61 MG CAPS Take 61  mg by mouth daily. 04/27/22   Croitoru, Mihai, MD  TURMERIC CURCUMIN PO Take 1 tablet by mouth daily.    [provider]      Allergies    Neurontin [gabapentin]    Review of Systems   Review of Systems  Neurological:  Positive for weakness.  All other systems reviewed and are negative.   Physical Exam Updated Vital Signs BP (!) 157/67   Pulse 66   Temp 97.8 F (36.6 C) (Oral)   Resp 15   SpO2 98%  Physical Exam Vitals and nursing note reviewed.  Constitutional:      Comments: Confused and moving all  extremities  HENT:     Head: Normocephalic.     Nose: Nose normal.     Mouth/Throat:     Mouth: Mucous membranes are dry.  Eyes:     Extraocular Movements: Extraocular movements intact.     Pupils: Pupils are equal, round, and reactive to light.  Cardiovascular:     Rate and Rhythm: Normal rate and regular rhythm.     Pulses: Normal pulses.     Heart sounds: Normal heart sounds.  Pulmonary:     Effort: Pulmonary effort is normal.     Breath sounds: Normal breath sounds.  Abdominal:     General: Abdomen is flat.     Palpations: Abdomen is soft.  Genitourinary:    Comments: Foley in place Musculoskeletal:        General: Normal range of motion.     Cervical back: Normal range of motion and neck supple.  Skin:    General: Skin is warm.     Capillary Refill: Capillary refill takes less than 2 seconds.  Neurological:     Comments: Confused, moving all extremities, not following commands   Psychiatric:     Comments: Unable      ED Results / Procedures / Treatments   Labs (all labs ordered are listed, but only abnormal results are displayed) Labs Reviewed  CBC WITH DIFFERENTIAL/PLATELET - Abnormal; Notable for the following components:      Result Value   RBC 3.75 (*)    Hemoglobin 10.8 (*)    HCT 32.8 (*)    RDW 19.1 (*)    Platelets 419 (*)    Abs Immature Granulocytes 0.31 (*)    All other components within normal limits  COMPREHENSIVE METABOLIC PANEL - Abnormal; Notable for the following components:   Sodium 133 (*)    Potassium 3.2 (*)    Chloride 96 (*)    Glucose, Bld 58 (*)    BUN 32 (*)    Calcium 10.6 (*)    Total Protein 6.3 (*)    Albumin 2.3 (*)    AST 163 (*)    ALT 48 (*)    Alkaline Phosphatase 571 (*)    Total Bilirubin 1.7 (*)    All other components within normal limits  URINALYSIS, W/ REFLEX TO CULTURE (INFECTION SUSPECTED) - Abnormal; Notable for the following components:   APPearance HAZY (*)    Ketones, ur 5 (*)    Leukocytes,Ua LARGE  (*)    Bacteria, UA RARE (*)    All other components within normal limits  CBG MONITORING, ED - Abnormal; Notable for the following components:   Glucose-Capillary 60 (*)    All other components within normal limits  CBG MONITORING, ED - Abnormal; Notable for the following components:   Glucose-Capillary 136 (*)    All other components within normal  limits  CBG MONITORING, ED - Abnormal; Notable for the following components:   Glucose-Capillary 112 (*)    All other components within normal limits  CBG MONITORING, ED - Abnormal; Notable for the following components:   Glucose-Capillary 106 (*)    All other components within normal limits  SARS CORONAVIRUS 2 BY RT PCR  URINE CULTURE  CBG MONITORING, ED    EKG None  Radiology CT HEAD WO CONTRAST ( )  Result Date: 07/11/2023 CLINICAL DATA:  Mental status change.  History of brain metastases. EXAM: CT HEAD WITHOUT CONTRAST TECHNIQUE: Contiguous axial images were obtained from the base of the skull through the vertex without intravenous contrast. RADIATION DOSE REDUCTION: This exam was performed according to the departmental dose-optimization program which includes automated exposure control, adjustment of the mA and/or kV according to patient size and/or use of iterative reconstruction technique. COMPARISON:  MRI brain 06/22/2023.  CT head 06/21/2023. FINDINGS: Brain: Heterogeneous lesion in the left occipital lobe measures proximally 2.0 x 1.5 cm similar to the prior study. There is surrounding vasogenic edema, also unchanged. Mild periventricular white matter hypodensity persists and appears unchanged. No new focal areas of edema identified. No acute intracranial hemorrhage, extra-axial fluid collection or midline shift. No hydrocephalus. Oval density in the right lateral ventricle measuring 6 x 4 mm appears unchanged. Vascular: Atherosclerotic calcifications are present within the cavernous internal carotid arteries. Skull: Normal. Negative  for fracture or focal lesion. Sinuses/Orbits: No acute finding. Other: None. IMPRESSION: 1. No acute intracranial process. 2. Stable left occipital lobe lesion with surrounding vasogenic edema. 3. Stable periventricular white matter hypodensity, likely chronic small vessel ischemic changes. 4. Stable small mass in the right lateral ventricle. Electronically Signed   By: Darliss Cheney M.D.   On: 07/11/2023 20:16   DG Chest Port 1 View  Result Date: 07/11/2023 CLINICAL DATA:  Cough. EXAM: PORTABLE CHEST 1 VIEW COMPARISON:  Chest radiograph dated 06/29/2023. FINDINGS: Small bilateral pleural effusions, left greater right relatively similar to or minimally increased since the prior radiograph. There are bibasilar atelectasis. Pneumonia is not excluded. No pneumothorax. Stable cardiac silhouette. Atherosclerotic calcification of the aorta. Degenerative changes of the spine. No acute osseous pathology. IMPRESSION: No significant interval change since the prior radiograph. Electronically Signed   By: Elgie Collard M.D.   On: 07/11/2023 20:01    Procedures Procedures    Angiocath insertion Performed by: Richardean Canal  Consent: Verbal consent obtained. Risks and benefits: risks, benefits and alternatives were discussed Time out: Immediately prior to procedure a "time out" was called to verify the correct patient, procedure, equipment, support staff and site/side marked as required.  Preparation: Patient was prepped and draped in the usual sterile fashion.  Vein Location: L forearm   Ultrasound Guided  Gauge: 20 long   Normal blood return and flush without difficulty Patient tolerance: Patient tolerated the procedure well with no immediate complications.  CRITICAL CARE Performed by: Richardean Canal   Total critical care time: 36 minutes  Critical care time was exclusive of separately billable procedures and treating other patients.  Critical care was necessary to treat or prevent imminent or  life-threatening deterioration.  Critical care was time spent personally by me on the following activities: development of treatment plan with patient and/or surrogate as well as nursing, discussions with consultants, evaluation of patient's response to treatment, examination of patient, obtaining history from patient or surrogate, ordering and performing treatments and interventions, ordering and review of laboratory studies, ordering and review of  radiographic studies, pulse oximetry and re-evaluation of patient's condition.   Medications Ordered in ED Medications  cefTRIAXone (ROCEPHIN) 1 g in sodium chloride 0.9 % 100 mL IVPB (has no administration in time range)  dextrose 50 % solution 50 mL (50 mLs Intravenous Given 07/11/23 1903)  sodium chloride 0.9 % bolus 1,000 mL (1,000 mLs Intravenous New Bag/Given 07/11/23 1910)    ED Course/ Medical Decision Making/ A&P                             Medical Decision Making Zawadi Aplin is a 88 y.o. female here presenting with encephalopathy.  Patient is very confused.  Encephali the is likely secondary to hypoglycemia versus UTI versus pneumonia.  Patient also has a history of mets to the brain so we will get a CT head to rule out bleed.  Will get CBC and CMP and UA as well.  Patient hypoglycemic and will give D50.  10:02 PM I reviewed patient's labs and patient's initial glucose was 58.  Given D50 and they went up to 136 and now trending down to 106.  Patient does have a UTI on urinalysis.  CT head showed known occipital lesion with edema.  Patient is still very confused.  At this point patient will be admitted for encephalopathy likely secondary to hypoglycemia and poor p.o. intake and UTI.    Problems Addressed: Encephalopathy: acute illness or injury Hypoglycemia: acute illness or injury Urinary tract infection associated with catheterization of urinary tract, unspecified indwelling urinary catheter type, initial encounter [facility]): acute  illness or injury  Amount and/or Complexity of Data Reviewed Labs: ordered. Decision-making details documented in ED Course. Radiology: ordered and independent interpretation performed. Decision-making details documented in ED Course.  Risk Prescription drug management. Decision regarding hospitalization.    Final Clinical Impression(s) / ED Diagnoses Final diagnoses:  None    Rx / DC Orders ED Discharge Orders     None         Charlynne Pander, MD 07/11/23 2206

## 2023-07-11 NOTE — H&P (Addendum)
History and Physical  Amanda Green WUJ:[id number] DOB: July 06, 1935 DOA: 07/11/2023  Referring physician: Dr. Silverio Lay, EDP  PCP: Ollen Bowl, MD  Outpatient Specialists: Medical oncology Patient coming from: Home lives with her daughters  Chief Complaint: Lethargy.  HPI: Amanda Green is a 88 y.o. female with medical history significant for breast cancer with metastasis to the brain, dementia, chronic HFpEF, who presented from home due to lethargy and poor oral intake for the past few days.  Per her daughter at bedside, today the patient stopped eating.  EMS was activated due to lethargy.  Upon arrival, the patient was hypoglycemic with blood sugar in the 40s.  Treated with 2 oral glucose p.o. and 1 mg glucagon IM en route by EMS.  No reported nausea or vomiting.  The patient was brought into the ED for further evaluation.  In the ED, weak-appearing and lethargic.  Blood sugar 58.  Lab studies notable for electrolyte derangement.  UA was positive for pyuria.  Cultures were ordered and the patient was started on empiric IV antibiotics for UTI by EDP.    Noncontrast Head CT revealed the following findings:  1. No acute intracranial process. 2. Stable left occipital lobe lesion with surrounding vasogenic edema. 3. Stable periventricular white matter hypodensity, likely chronic small vessel ischemic changes. 4. Stable small mass in the right lateral ventricle.  TRH, hospitalist service was asked admit.  Admitted to progressive care unit as inpatient status.   ED Course: Temperature 97.8.  BP 103/68, pulse 63, respiratory 22, saturation 98% on room air.  Review of Systems: Review of systems as noted in the HPI. All other systems reviewed and are negative.   Past Medical History:  Diagnosis Date   Arthritis    knees,   Breast cancer (HCC) 1999   right mastectomy   Breast cancer, left (HCC) 05/2019   left lumpectomy   Cerebrovascular accident (CVA) (HCC) 02/07/2021    Diabetes mellitus    Dislocation of left shoulder joint    Family history of prostate cancer    Hyperlipidemia    Hypertension    Memory loss    Numbness and tingling of right lower extremity    Paresthesia    Stroke (HCC)    light stroke - 2012 ,right leg nerve damage    Past Surgical History:  Procedure Laterality Date   ABDOMINAL HYSTERECTOMY     APPENDECTOMY     BREAST BIOPSY Left 01/19/2010   BREAST LUMPECTOMY WITH RADIOACTIVE SEED LOCALIZATION Left 07/17/2019   Procedure: LEFT BREAST LUMPECTOMY WITH RADIOACTIVE SEED LOCALIZATION;  Surgeon: Luretha Murphy, MD;  Location: [facility];  Service: General;  Laterality: Left;   BREAST SURGERY     Right mastectomy 1999   CARPAL TUNNEL RELEASE Right 09/30/2014   Procedure: RIGHT CARPAL TUNNEL RELEASE;  Surgeon: Cindee Salt, MD;  Location: [facility];  Service: Orthopedics;  Laterality: Right;   COLONOSCOPY     EYE SURGERY     cataracts   JOINT REPLACEMENT     lt total knee 2011   LIPOMA EXCISION  11/28/2011   Procedure: EXCISION LIPOMA;  Surgeon: Valarie Merino, MD;  Location: [facility];  Service: General;  Laterality: N/A;  excisino lipoma 4 cm back of neck   MASTECTOMY Right 1999   TOTAL MASTECTOMY Left 05/10/2021   Procedure: LEFT TOTAL MASTECTOMY;  Surgeon: Luretha Murphy, MD;  Location: [facility];  Service: General;  Laterality: Left;    Social History:  reports that she has never smoked. She has never used smokeless tobacco. She reports that she does not drink alcohol and does not use drugs.   Allergies  Allergen Reactions   Neurontin [Gabapentin] Other (See Comments)    Drowsiness    Family History  Problem Relation Age of Onset   Esophageal cancer Mother        smoker   Other Father        Unsure of medical history.   Prostate cancer Brother        d. 12s   Cancer Son        ?? unsure, but may have had cancer   Cancer Son        ?? unsure, but may have had cancer       Prior to Admission medications   Medication Sig Start Date End Date Taking? Authorizing Provider  acetaminophen (TYLENOL) 650 MG CR tablet Take 650-1,300 mg by mouth 2 (two) times daily as needed for pain.    [provider]  albuterol (PROVENTIL) (2.5 MG/3ML) 0.083% nebulizer solution Take 2.5 mg by nebulization 2 (two) times daily as needed for wheezing or shortness of breath.    [provider]  amiodarone (PACERONE) 100 MG tablet Take 1 tablet (100 mg total) by mouth daily. 06/19/23   Cannon Kettle, PA-C  ASCORBIC ACID PO Take 1 tablet by mouth daily. Vitamin C, unknown strength.    [provider]  brimonidine (ALPHAGAN) 0.2 % ophthalmic solution Place 1 drop into both eyes daily.    [provider]  Cyanocobalamin (VITAMIN B-12 PO) Take 1 tablet by mouth daily.    [provider]  diclofenac Sodium (VOLTAREN) 1 % GEL Apply 1 Application topically 4 (four) times daily as needed (joint pain).    [provider]  dorzolamide (TRUSOPT) 2 % ophthalmic solution Place 1 drop into both eyes 2 (two) times daily. 10/19/20   [provider]  furosemide (LASIX) 20 MG tablet Take 1 tablet (20 mg total) by mouth daily. 03/26/23 08/24/23  Arrien, York Ram, MD  hydrocerin (EUCERIN) CREA Apply 1 Application topically daily. Apply to left leg and heel once daily, do not apply between digits 06/23/23   Atway, Rayann N, DO  hydrochlorothiazide (HYDRODIURIL) 12.5 MG tablet Take 12.5 mg by mouth daily.    [provider]  leptospermum manuka honey (MEDIHONEY) PSTE paste Apply 1 Application topically daily. Apply to right heel Unstageable PI and sacral stage 2 PI after cleansing Apply thin layer (3 mm) to wound. 06/23/23 07/23/23  Atway, Rayann N, DO  losartan (COZAAR) 25 MG tablet Take 1 tablet (25 mg total) by mouth daily. 03/26/23 08/24/23  Arrien, York Ram, MD  MAGNESIUM OXIDE PO Take 1 tablet by mouth daily.    [provider]  memantine (NAMENDA) 10 MG tablet Take 1 tablet (10 mg total) by mouth 2 (two) times daily. 02/04/23   Ihor Austin, NP  metoprolol tartrate (LOPRESSOR) 50 MG tablet Take 1 tablet (50 mg total) by mouth 2 (two) times daily. 06/19/23   Cannon Kettle, PA-C  Multiple Vitamins-Minerals (MULTIVITAMIN WOMEN 50+) TABS Take 1 tablet by mouth daily.    [provider]  spironolactone (ALDACTONE) 25 MG tablet Take 1 tablet (25 mg total) by mouth daily. 06/19/23 09/17/23  Cannon Kettle, PA-C  Tafamidis (VYNDAMAX) 61 MG CAPS Take 61 mg by mouth daily. 04/27/22   Croitoru, Mihai, MD  TURMERIC CURCUMIN PO Take 1 tablet by  mouth daily.    [provider]    Physical Exam: BP (!) 157/67   Pulse 66   Temp 97.8 F (36.6 C) (Oral)   Resp 15   SpO2 98%   General: 88 y.o. year-old female frail-appearing in no acute distress.  Lethargic. Cardiovascular: Regular rate and rhythm with no rubs or gallops.  No thyromegaly or JVD noted.  No lower extremity edema bilaterally. Respiratory: Clear to auscultation with no wheezes or rales.  Poor inspiratory effort. Abdomen: Soft nontender nondistended with normal bowel sounds x4 quadrants. Muskuloskeletal: No cyanosis, clubbing or edema noted bilaterally Neuro: CN II-XII intact, strength, sensation, reflexes Skin: No ulcerative lesions noted or rashes Psychiatry: Judgement and insight appear altered. Mood is appropriate for condition and setting          Labs on Admission:  Basic Metabolic Panel: Recent Labs  Lab 07/11/23 1856  NA 133*  K 3.2*  CL 96*  CO2 22  GLUCOSE 58*  BUN 32*  CREATININE 0.71  CALCIUM 10.6*   Liver Function Tests: Recent Labs  Lab 07/11/23 1856  AST 163*  ALT 48*  ALKPHOS 571*  BILITOT 1.7*  PROT 6.3*  ALBUMIN 2.3*   No results for input(s): "LIPASE", "AMYLASE" in the last 168 hours. No results for input(s): "AMMONIA" in the last 168 hours. CBC: Recent Labs  Lab 07/11/23 1856   WBC 9.1  NEUTROABS 7.1  HGB 10.8*  HCT 32.8*  MCV 87.5  PLT 419*   Cardiac Enzymes: No results for input(s): "CKTOTAL", "CKMB", "CKMBINDEX", "TROPONINI" in the last 168 hours.  BNP (last 3 results) Recent Labs    04/01/23 0553 04/02/23 0353 06/21/23 2200  BNP 271.6* 424.5* 267.3*    ProBNP (last 3 results) No results for input(s): "PROBNP" in the last 8760 hours.  CBG: Recent Labs  Lab 07/11/23 1844 07/11/23 1919 07/11/23 2035 07/11/23 2142  GLUCAP 60* 136* 112* 106*    Radiological Exams on Admission: CT HEAD WO CONTRAST ( )  Result Date: 07/11/2023 CLINICAL DATA:  Mental status change.  History of brain metastases. EXAM: CT HEAD WITHOUT CONTRAST TECHNIQUE: Contiguous axial images were obtained from the base of the skull through the vertex without intravenous contrast. RADIATION DOSE REDUCTION: This exam was performed according to the departmental dose-optimization program which includes automated exposure control, adjustment of the mA and/or kV according to patient size and/or use of iterative reconstruction technique. COMPARISON:  MRI brain 06/22/2023.  CT head 06/21/2023. FINDINGS: Brain: Heterogeneous lesion in the left occipital lobe measures proximally 2.0 x 1.5 cm similar to the prior study. There is surrounding vasogenic edema, also unchanged. Mild periventricular white matter hypodensity persists and appears unchanged. No new focal areas of edema identified. No acute intracranial hemorrhage, extra-axial fluid collection or midline shift. No hydrocephalus. Oval density in the right lateral ventricle measuring 6 x 4 mm appears unchanged. Vascular: Atherosclerotic calcifications are present within the cavernous internal carotid arteries. Skull: Normal. Negative for fracture or focal lesion. Sinuses/Orbits: No acute finding. Other: None. IMPRESSION: 1. No acute intracranial process. 2. Stable left occipital lobe lesion with surrounding vasogenic edema. 3. Stable  periventricular white matter hypodensity, likely chronic small vessel ischemic changes. 4. Stable small mass in the right lateral ventricle. Electronically Signed   By: Darliss Cheney M.D.   On: 07/11/2023 20:16   DG Chest Port 1 View  Result Date: 07/11/2023 CLINICAL DATA:  Cough. EXAM: PORTABLE CHEST 1 VIEW COMPARISON:  Chest radiograph dated 06/29/2023. FINDINGS: Small bilateral pleural effusions, left  greater right relatively similar to or minimally increased since the prior radiograph. There are bibasilar atelectasis. Pneumonia is not excluded. No pneumothorax. Stable cardiac silhouette. Atherosclerotic calcification of the aorta. Degenerative changes of the spine. No acute osseous pathology. IMPRESSION: No significant interval change since the prior radiograph. Electronically Signed   By: Elgie Collard M.D.   On: 07/11/2023 20:01    EKG: I independently viewed the EKG done and my findings are as followed: None available at the time of this visit.  Assessment/Plan Present on Admission:  Acute metabolic encephalopathy  Principal Problem:   Acute metabolic encephalopathy  Acute metabolic encephalopathy likely secondary to hypoglycemia and urinary tract infection in the setting of malignancy with brain metastasis. Treat underlying conditions Reorient as needed Obtain EEG Fall precautions Aspiration precautions N.p.o. due to lethargy.  Presumptive UTI, POA UA positive for pyuria Monitor urine culture and peripheral blood cultures x 2 Started on Rocephin in the ED, continue.  Hypoglycemia secondary to poor oral intake D5 NS KCl 40 mill equivalent at 50 cc/h x 1 day Encourage oral intake as tolerated  Brain cancer with metastasis to the brain CT head showing findings as stated above Follow EEG IV Decadron 10 mg every 6 hours x 2 doses for vasogenic edema  Hypertension BPs are currently soft Hold off home oral antihypertensives while NPO. Continue to monitor vital  signs  Physical debility PT OT assessment Fall precautions  Goals of care Currently DNR Palliative care team consulted to assist with establishing goals of care.    Time: 75 minutes.    DVT prophylaxis: Subcu Lovenox daily  Code Status: DNR per her daughters at bedside.  Family Communication: Updated patient's daughters at bedside.  Disposition Plan: Admitted to progressive care unit  Consults called: Palliative care team  Admission status: Inpatient status.   Status is: Inpatient The patient requires at least 2 midnights for further evaluation and treatment of present condition.   Darlin Drop MD [facility] Pager [phone]  If 7PM-7AM, please contact night-coverage [email or web address] Password [facility]  07/11/2023, 10:05 PM

## 2023-07-11 NOTE — ED Triage Notes (Signed)
Patient BIB EMS from home c/o hypoglycemia. Initial cbg was 40.  Patient had 2 oral glucose P.O. and 1mg  glucagon IM given by EMS.  No report of N/V.   BP 156/86 HR 66 RR 20 97% on RA CBG 67

## 2023-07-11 NOTE — Telephone Encounter (Signed)
I spoke with the patient's daughter, Amanda Green, about her upcoming radiation treatment planning appointments. Amanda Green has already seen these visits in MyChart and plans to have her mother here. No questions or concerns at this time.   [name] R.T.(R)(T) Radiation Special Procedures Navigator

## 2023-07-12 DIAGNOSIS — G9341 Metabolic encephalopathy: Secondary | ICD-10-CM | POA: Diagnosis not present

## 2023-07-12 LAB — GLUCOSE, CAPILLARY
Glucose-Capillary: 124 mg/dL — ABNORMAL HIGH (ref 70–99)
Glucose-Capillary: 132 mg/dL — ABNORMAL HIGH (ref 70–99)
Glucose-Capillary: 133 mg/dL — ABNORMAL HIGH (ref 70–99)
Glucose-Capillary: 137 mg/dL — ABNORMAL HIGH (ref 70–99)
Glucose-Capillary: 149 mg/dL — ABNORMAL HIGH (ref 70–99)
Glucose-Capillary: 167 mg/dL — ABNORMAL HIGH (ref 70–99)

## 2023-07-12 MED ORDER — ORAL CARE MOUTH RINSE: 15.0000 mL | OROMUCOSAL | Status: DC | PRN

## 2023-07-12 MED ORDER — DEXAMETHASONE SODIUM PHOSPHATE 10 MG/ML IJ SOLN
10.0000 mg | Freq: Four times a day (QID) | INTRAMUSCULAR | Status: AC
  Administered 2023-07-12 (×2): 10 mg via INTRAVENOUS
  Filled 2023-07-12 (×2): qty 1

## 2023-07-12 MED ORDER — CHLORHEXIDINE GLUCONATE CLOTH 2 % EX PADS
6.0000 | MEDICATED_PAD | Freq: Every day | CUTANEOUS | Status: DC
  Administered 2023-07-12 – 2023-07-18 (×7): 6 via TOPICAL

## 2023-07-12 MED ORDER — ORAL CARE MOUTH RINSE
15.0000 mL | OROMUCOSAL | Status: DC
  Administered 2023-07-12 – 2023-07-18 (×16): 15 mL via OROMUCOSAL

## 2023-07-12 MED ORDER — DEXAMETHASONE SODIUM PHOSPHATE 10 MG/ML IJ SOLN: 8.0000 mg | INTRAMUSCULAR | Status: DC

## 2023-07-12 MED ORDER — SODIUM CHLORIDE 0.9 % IV SOLN
500.0000 mg | INTRAVENOUS | Status: AC
  Administered 2023-07-12 – 2023-07-14 (×3): 500 mg via INTRAVENOUS
  Filled 2023-07-12 (×3): qty 5

## 2023-07-12 MED ORDER — LEVETIRACETAM 250 MG PO TABS
250.0000 mg | ORAL_TABLET | Freq: Two times a day (BID) | ORAL | Status: DC
  Administered 2023-07-13 – 2023-07-18 (×10): 250 mg via ORAL
  Filled 2023-07-12 (×10): qty 1

## 2023-07-12 MED ORDER — ZOLEDRONIC ACID 4 MG/100ML IV SOLN
4.0000 mg | Freq: Once | INTRAVENOUS | Status: AC
  Administered 2023-07-12: 4 mg via INTRAVENOUS
  Filled 2023-07-12: qty 100

## 2023-07-12 MED ORDER — DEXAMETHASONE SODIUM PHOSPHATE 10 MG/ML IJ SOLN
8.0000 mg | INTRAMUSCULAR | Status: DC
  Administered 2023-07-13 – 2023-07-17 (×5): 8 mg via INTRAVENOUS
  Filled 2023-07-12 (×5): qty 1

## 2023-07-12 MED ORDER — CALCITONIN (SALMON) 200 UNIT/ACT NA SOLN
1.0000 | Freq: Every day | NASAL | Status: DC
  Administered 2023-07-12 – 2023-07-17 (×6): 1 via NASAL
  Filled 2023-07-12: qty 3.7

## 2023-07-12 MED ORDER — SODIUM CHLORIDE 0.9 % IV SOLN
2.0000 g | INTRAVENOUS | Status: DC
  Administered 2023-07-12 – 2023-07-14 (×3): 2 g via INTRAVENOUS
  Filled 2023-07-12 (×3): qty 20

## 2023-07-12 NOTE — Consult Note (Signed)
Palliative Care Consult Note  88 yo with metastatic breast cancer admitted with AMS and Hypercalcemia of Malignancy with known brain mets and functional status decline.

## 2023-07-12 NOTE — TOC Initial Note (Addendum)
Transition of Care [facility]) - Initial/Assessment Note    Patient Details  Name: Amanda Green MRN: [id number] Date of Birth: 1935-03-09  Transition of Care [facility]) CM/SW Contact:    Lanier Clam, RN Phone Number: 07/12/2023, 10:57 AM  Clinical Narrative:  Dementia;[name] d/c plan home [name] w/[facility] HHPT/OT/aide-rep [facility] aware to resume. PTAR needed @ d/c.   -2:28p Followed by [facility] rep Cheri-Palliative Care program-will continue to follow if needed for transition once referral received.               Expected Discharge Plan: Home w Home Health Services Barriers to Discharge: Continued Medical Work up   Patient Goals and CMS Choice   CMS Medicare.gov Compare Post Acute Care list provided to:: Patient Represenative (must comment) ([name]) Choice offered to / list presented to : Adult Children [place] ownership interest in [facility].provided to:: Adult Children    Expected Discharge Plan and Services   Discharge Planning Services: CM Consult   Living arrangements for the past 2 months: Single Family Home                                      Prior Living Arrangements/Services Living arrangements for the past 2 months: Single Family Home Lives with:: Adult Children Patient language and need for interpreter reviewed:: Yes Do you feel safe going back to the place where you live?: Yes      Need for Family Participation in Patient Care: Yes (Comment) Care giver support system in place?: Yes (comment) Current home services: DME, Home PT, Home OT (hoyer lift,w/c;Ative [facility] HHPT/OT/aide) Criminal Activity/Legal Involvement Pertinent to Current Situation/Hospitalization: No - Comment as needed  Activities of Daily Living Home Assistive Devices/Equipment: Nurse, adult, Wheelchair, Environmental consultant (specify type), Bedside commode/3-in-1 ADL Screening (condition at time of admission) Patient's cognitive ability adequate  to safely complete daily activities?: No Is the patient deaf or have difficulty hearing?: No Does the patient have difficulty seeing, even when wearing glasses/contacts?: No Does the patient have difficulty concentrating, remembering, or making decisions?: Yes Patient able to express need for assistance with ADLs?: Yes Does the patient have difficulty dressing or bathing?: Yes Independently performs ADLs?: No Communication: Independent Dressing (OT): Needs assistance Is this a change from baseline?: Pre-admission baseline Grooming: Needs assistance Is this a change from baseline?: Pre-admission baseline Feeding: Needs assistance Is this a change from baseline?: Pre-admission baseline Bathing: Needs assistance Is this a change from baseline?: Pre-admission baseline Toileting: Needs assistance Is this a change from baseline?: Pre-admission baseline In/Out Bed: Dependent Is this a change from baseline?: Pre-admission baseline Walks in Home: Dependent Is this a change from baseline?: Pre-admission baseline Does the patient have difficulty walking or climbing stairs?: Yes Weakness of Legs: Both Weakness of Arms/Hands: Both  Permission Sought/Granted Permission sought to share information with : Case Manager Permission granted to share information with : Yes, Verbal Permission Granted  Share Information with NAME: Case Manager           Emotional Assessment Appearance:: Appears stated age Attitude/Demeanor/Rapport: Gracious Affect (typically observed): Accepting Orientation: : Oriented to Self, Oriented to Place Alcohol / Substance Use: Not Applicable Psych Involvement: No (comment)  Admission diagnosis:  Hypoglycemia [E16.2] Encephalopathy [G93.40] Acute metabolic encephalopathy [G93.41] Urinary tract infection associated with catheterization of urinary tract, unspecified indwelling urinary catheter type, initial encounter (HCC) [Q25.956L, N39.0] Patient Active Problem List  Diagnosis Date Noted   Protein-calorie malnutrition, severe 07/02/2023   Counseling and coordination of care 07/02/2023   Goals of care, counseling/discussion 07/02/2023   Palliative care encounter 07/02/2023   Need for emotional support 07/02/2023   HCAP (healthcare-associated pneumonia) 06/29/2023   Elevated LFTs 06/29/2023   Dementia without behavioral disturbance (HCC) 06/29/2023   Atrial fibrillation, chronic (HCC) 06/29/2023   T2DM (type 2 diabetes mellitus) (HCC) 06/29/2023   (HFpEF) heart failure with preserved ejection fraction (HCC) 06/29/2023   HLD (hyperlipidemia) 06/29/2023   Acute metabolic encephalopathy 06/21/2023   Neoplasm of brain causing mass effect on adjacent structures (HCC) 02/14/2023   Cardiac amyloidosis (HCC) 02/14/2023   Spinal stenosis of lumbar region with neurogenic claudication 03/15/2022   Gait abnormality 11/13/2021   Low back pain without sciatica 11/13/2021   Ductal carcinoma in situ (DCIS) of breast 05/10/2021   Paroxysmal atrial fibrillation (HCC) 04/14/2021   Essential hypertension 04/14/2021   Controlled type 2 diabetes mellitus without complication, without long-term current use of insulin (HCC) 04/14/2021   Cognitive impairment 02/07/2021   Genetic testing 09/25/2019   Breast cancer metastasized to brain (HCC) 06/10/2019   Paresthesia of right lower extremity 01/28/2017   History of CVA (cerebrovascular accident) 01/28/2017   Colon polyps 08/15/2016   Chronic right shoulder pain 11/24/2015   Right mastectomy 1999 03/26/2012   PCP:  Ollen Bowl, MD Pharmacy:   [facility] [address] [place] - [address] [place] [address] Phone: [phone] Fax: [phone]     Social Determinants of Health (SDOH) Social History: SDOH Screenings   Food Insecurity: No Food Insecurity (07/12/2023)  Housing: Low Risk  (07/12/2023)  Transportation Needs: No Transportation Needs (07/12/2023)   Utilities: Not At Risk (07/12/2023)  Tobacco Use: Low Risk  (07/11/2023)   SDOH Interventions:     Readmission Risk Interventions    07/02/2023   12:00 PM 07/01/2023   12:14 PM 07/01/2023   12:11 PM  Readmission Risk Prevention Plan  Transportation Screening Complete    Medication Review (RN Care Manager) Referral to Pharmacy  Complete  PCP or Specialist appointment within 3-5 days of discharge Complete Not Complete   PCP/Specialist Appt Not Complete comments patient does have PCP and will follow up appt schediled with specialists   HRI or Home Care Consult Complete    HRI or Home Care Consult Pt Refusal Comments list provided for daughter    SW Recovery Care/Counseling Consult Complete Not Complete   Palliative Care Screening Complete    Skilled Nursing Facility Patient Refused Not Complete

## 2023-07-12 NOTE — Progress Notes (Signed)
PT Cancellation Note  Patient Details Name: Amanda Green MRN: [id number] DOB: 1935-11-05   Cancelled Treatment:    Reason Eval/Treat Not Completed: PT screened, no needs identified, will sign off Pt with 2 previous admissions this month and had PT evaluations.  Pt was bed level only and max-total assist.  Daughter present today and reports pt total care at home.  Pt has hospital bed, hoyer lift and w/c.  Daughters assist pt at home.  Pt had just started HHPT prior to this admission.  Pt has been bedbound at least 2 months so would defer PT needs to [facility].  Acute PT to sign off at this time.  If OOB is desired, pt will need maximove (since lift is currently her baseline).   Janan Halter [place] 07/12/2023, 10:45 AM Paulino Door, DPT Physical Therapist Acute Rehabilitation Services Office: [phone]

## 2023-07-12 NOTE — Progress Notes (Addendum)
Progress Note   Patient: Amanda Green ZOX:[id number] DOB: 09/17/1935 DOA: 07/11/2023     1 DOS: the patient was seen and examined on 07/12/2023    Subjective:  Patient seen and examined at bedside this morning Denies nausea vomiting abdominal pain Appears very lethargic but able to communicate Mortalities hide Case discussed with palliative care physician and at this point we believe the best course will be hospice with comfort care   Brief hospital course: From HPI "Amanda Green is a 88 y.o. female with medical history significant for breast cancer with metastasis to the brain, dementia, chronic HFpEF, who presented from home due to lethargy and poor oral intake for the past few days.  Per her daughter at bedside, today the patient stopped eating.  EMS was activated due to lethargy.  Upon arrival, the patient was hypoglycemic with blood sugar in the 40s.  Treated with 2 oral glucose p.o. and 1 mg glucagon IM en route by EMS.  No reported nausea or vomiting.  The patient was brought into the ED for further evaluation.   In the ED, weak-appearing and lethargic.  Blood sugar 58.  Lab studies notable for electrolyte derangement.  UA was positive for pyuria.  Cultures were ordered and the patient was started on empiric IV antibiotics for UTI by EDP."  Assessment and Plan: Acute metabolic encephalopathy likely secondary to hypoglycemia, pneumonia, and urinary tract infection in the setting of malignancy with brain metastasis. Treat underlying conditions Patient has chronic indwelling Foley catheter due to urinary retention that was observed in April of this year. Family have agreed for trial of voiding since patient did not have any urology follow-up. Reorient as needed We will feed as tolerated Monitor neurochecks every 4 hours Continue treatment for UTI and possible underlying pneumonia Currently on ceftriaxone and azithromycin I have reviewed chest x-ray results  Hypercalcemia  likely related to malignancy Presented with calcium of 10.6 with albumin 2.3 corrected calcium of 11.8 Continue calcitonin  Presumptive UTI, POA UA positive for pyuria Monitor urine culture and peripheral blood cultures x 2 Continue on Rocephin in the ED, continue.   Hypoglycemia secondary to poor oral intake Patient received IV fluid on admission Encourage oral intake as tolerated   Brain cancer with metastasis to the brain CT scan of the brain showing metastatic disease Palliative care initiated patient on Keppra Continue IV Decadron 10 mg every 6 hours x 2 doses for vasogenic edema   Hypertension Monitor blood pressure closely Currently having relative hypotension We will resume antihypertensives when blood pressure is much better   Physical debility PT OT consulted we appreciate input   Goals of care DNI DNR   DVT prophylaxis: Subcu Lovenox daily   Code Status: DNR per her daughters at bedside.   Family Communication: Updated patient's daughters at bedside.          Physical Exam:  General: Elderly female laying in bed chronically ill looking and frail Cardiovascular: Regular rate and rhythm with no rubs or gallops.  No thyromegaly or JVD noted.  No lower extremity edema bilaterally. Respiratory: Clear to auscultation no wheezing Abdomen: Nontender nondistended Muskuloskeletal: No cyanosis, clubbing or edema noted bilaterally Neuro: CN II-XII intact, appears lethargic very slow in responding Skin: No ulcerative lesions noted or rashes   Data Reviewed: I have reviewed patient's imaging of the brain CT scan report that showed findings of small stable mass in the right lateral ventricle, but no other acute intracranial abnormality I have also reviewed  patient's lab results, previous records, palliative care documentation, transition of care manager documentation, PT OT as well as nursing documentation and vitals  Vitals:   07/11/23 2354 07/12/23 0412 07/12/23  0744 07/12/23 1253  BP: 125/61 114/66 (!) 101/58 116/70  Pulse: 67 69 73 83  Resp: 20     Temp: 97.7 F (36.5 C) 97.8 F (36.6 C) (!) 97.5 F (36.4 C) 98 F (36.7 C)  TempSrc: Oral Axillary Oral Oral  SpO2: 98% 100% 100% 99%  Weight:  61.9 kg    Height:  5' 2.99" (1.6 m)         Author: Loyce Dys, MD 07/12/2023 1:41 PM  For on call review [email or web address].

## 2023-07-13 DIAGNOSIS — G9341 Metabolic encephalopathy: Secondary | ICD-10-CM

## 2023-07-13 LAB — GLUCOSE, CAPILLARY
Glucose-Capillary: 123 mg/dL — ABNORMAL HIGH (ref 70–99)
Glucose-Capillary: 131 mg/dL — ABNORMAL HIGH (ref 70–99)
Glucose-Capillary: 135 mg/dL — ABNORMAL HIGH (ref 70–99)
Glucose-Capillary: 137 mg/dL — ABNORMAL HIGH (ref 70–99)
Glucose-Capillary: 138 mg/dL — ABNORMAL HIGH (ref 70–99)
Glucose-Capillary: 151 mg/dL — ABNORMAL HIGH (ref 70–99)

## 2023-07-13 MED ORDER — ACETAMINOPHEN 650 MG RE SUPP
325.0000 mg | RECTAL | Status: AC | PRN
  Administered 2023-07-13: 325 mg via RECTAL
  Filled 2023-07-13: qty 1

## 2023-07-13 MED ORDER — ACETAMINOPHEN 650 MG RE SUPP
650.0000 mg | RECTAL | Status: DC | PRN
  Administered 2023-07-13 – 2023-07-18 (×12): 650 mg via RECTAL
  Filled 2023-07-13 (×14): qty 1

## 2023-07-13 MED ORDER — MORPHINE BOLUS VIA INFUSION: 1.0000 mg | Freq: Once | INTRAVENOUS | Status: DC

## 2023-07-13 MED ORDER — MORPHINE SULFATE (PF) 2 MG/ML IV SOLN: 1.0000 mg | Freq: Once | INTRAVENOUS | Status: DC

## 2023-07-13 MED ORDER — MORPHINE BOLUS VIA INFUSION
1.0000 mg | Freq: Once | INTRAVENOUS | Status: DC
  Filled 2023-07-13: qty 1

## 2023-07-13 NOTE — Progress Notes (Signed)
Progress Note   Patient: Amanda Green AOZ:[id number] DOB: 1935/10/07 DOA: 07/11/2023     2 DOS: the patient was seen and examined on 07/13/2023     Subjective:  Patient seen and examined at bedside this morning in the presence of the daughter She is able to communicate much better today than yesterday I have requested speech therapist to see patient to evaluate safe swallowing She denies nausea vomiting chest pain or cough She appears very lethargic with debility I doubt she will be able to go home but it appears that will be patient's family's preference PT OT is on board and yet to leave their recommendations.  Brief hospital course: From HPI "Amanda Green is a 88 y.o. female with medical history significant for breast cancer with metastasis to the brain, dementia, chronic HFpEF, who presented from home due to lethargy and poor oral intake for the past few days.  Per her daughter at bedside, today the patient stopped eating.  EMS was activated due to lethargy.  Upon arrival, the patient was hypoglycemic with blood sugar in the 40s.  Treated with 2 oral glucose p.o. and 1 mg glucagon IM en route by EMS.  No reported nausea or vomiting.  The patient was brought into the ED for further evaluation.   In the ED, weak-appearing and lethargic.  Blood sugar 58.  Lab studies notable for electrolyte derangement.  UA was positive for pyuria.  Cultures were ordered and the patient was started on empiric IV antibiotics for UTI by EDP."   Assessment and Plan: Acute metabolic encephalopathy likely secondary to hypoglycemia, pneumonia, and urinary tract infection in the setting of malignancy with brain metastasis. Treat underlying conditions Patient has chronic indwelling Foley catheter due to urinary retention that was observed in April of this year. Family have agreed for trial of voiding since patient did not have any urology follow-up, orders have been placed Continue to reorient as  needed Speech therapist consulted Continue to monitor neurochecks every 4 hours Continue treatment for UTI and possible underlying pneumonia Continue ceftriaxone and azithromycin we will continue I have reviewed chest x-ray results   Stage III pressure ulcer noted to the right heel-present on admission Stage II  pressure ulcer noted to the sacral area-present on admission Wound care consulted we appreciate input  Hypercalcemia likely related to malignancy-improving Presented with calcium of 10.6 with albumin 2.3 corrected calcium of 11.8 Continue calcitonin and Zometa as recommended by palliative physician   Presumptive UTI, POA UA positive for pyuria Follow-up on culture results Continue current antibiotics   Hypoglycemia secondary to poor oral intake Patient received IV fluid on admission Encourage oral intake as tolerated   Brain cancer with metastasis to the brain CT scan of the brain showing metastatic disease Palliative care initiated patient on Keppra Continue IV Decadron for vasogenic edema   Hypertension Monitor blood pressure closely Currently having relative hypotension We will resume antihypertensives when blood pressure is much better   Physical debility PT OT consulted we appreciate input Speech therapist consulted for speech eval   Goals of care DNI DNR   DVT prophylaxis: Continue Lovenox   Code Status: DNR per her daughters at bedside.   Family Communication: Discussed with patient's daughter present at bedside and all her questions were answered     Physical Exam:   General: Elderly female laying in bed chronically ill looking, frail Cardiovascular: Regular rate and rhythm with no rubs or gallops.  No thyromegaly or JVD noted.  No  lower extremity edema bilaterally. Respiratory: Clear to auscultation no wheezing Abdomen: Nontender nondistended Muskuloskeletal: No cyanosis, clubbing or edema noted bilaterally Neuro: Patient lethargic with  generalized muscle weakness Skin: No ulcerative lesions noted or rashes     Data Reviewed: I have reviewed the patient's CBC, BMP, nursing documentation, palliative care documentation, transition of care manager documentation as well as vitals   Time spent: 45 minutes reviewing patient's chart, discussing goals of care with patient's daughter present at bedside, coordinating care and discussing plan of care with palliative physician as well as nursing staff  Vitals:   07/12/23 0744 07/12/23 1253 07/12/23 2027 07/13/23 0413  BP: (!) 101/58 116/70 112/62 (!) 115/56  Pulse: 73 83 86 84  Resp:   (!) 24 20  Temp: (!) 97.5 F (36.4 C) 98 F (36.7 C) 98 F (36.7 C) 98.1 F (36.7 C)  TempSrc: Oral Oral Oral Oral  SpO2: 100% 99% 98% 99%  Weight:      Height:        Author: Loyce Dys, MD 07/13/2023 11:36 AM  For on call review [email or web address].

## 2023-07-13 NOTE — Evaluation (Signed)
Clinical/Bedside Swallow Evaluation Patient Details  Name: Amanda Green MRN: [id number] Date of Birth: Nov 05, 1935  Today's Date: 07/13/2023 Time: SLP Start Time (ACUTE ONLY): 0945 SLP Stop Time (ACUTE ONLY): 1000 SLP Time Calculation (min) (ACUTE ONLY): 15 min  Past Medical History:  Past Medical History:  Diagnosis Date   Arthritis    knees,   Breast cancer (HCC) 1999   right mastectomy   Breast cancer, left (HCC) 05/2019   left lumpectomy   Cerebrovascular accident (CVA) (HCC) 02/07/2021   Diabetes mellitus    Dislocation of left shoulder joint    Family history of prostate cancer    Hyperlipidemia    Hypertension    Memory loss    Numbness and tingling of right lower extremity    Paresthesia    Stroke (HCC)    light stroke - 2012 ,right leg nerve damage    Past Surgical History:  Past Surgical History:  Procedure Laterality Date   ABDOMINAL HYSTERECTOMY     APPENDECTOMY     BREAST BIOPSY Left 01/19/2010   BREAST LUMPECTOMY WITH RADIOACTIVE SEED LOCALIZATION Left 07/17/2019   Procedure: LEFT BREAST LUMPECTOMY WITH RADIOACTIVE SEED LOCALIZATION;  Surgeon: Luretha Murphy, MD;  Location: [facility];  Service: General;  Laterality: Left;   BREAST SURGERY     Right mastectomy 1999   CARPAL TUNNEL RELEASE Right 09/30/2014   Procedure: RIGHT CARPAL TUNNEL RELEASE;  Surgeon: Cindee Salt, MD;  Location: [facility];  Service: Orthopedics;  Laterality: Right;   COLONOSCOPY     EYE SURGERY     cataracts   JOINT REPLACEMENT     lt total knee 2011   LIPOMA EXCISION  11/28/2011   Procedure: EXCISION LIPOMA;  Surgeon: Valarie Merino, MD;  Location: [facility];  Service: General;  Laterality: N/A;  excisino lipoma 4 cm back of neck   MASTECTOMY Right 1999   TOTAL MASTECTOMY Left 05/10/2021   Procedure: LEFT TOTAL MASTECTOMY;  Surgeon: Luretha Murphy, MD;  Location: [facility];  Service: General;  Laterality: Left;   HPI:  Patient is  an 88 y.o. female with PMH: HFpEF, paroxysmal atrial fibrillation, cardiac amyloidosis, breast cancer with brain and liver mets, history of CVA, and chronic indwelling Foley catheter since last admission 7/5-06/23/23 with encephalopathy and declining functional status. She presented to the hospital on 07/11/2023 with AMS, found to have hypercalcemia of malignancy. She has had poor PO intake since Tuesday (7/23) per daughter's report. SLP swallow evaluation ordered.    Assessment / Plan / Recommendation  Clinical Impression  Patient presents with clinical s/s of dysphagia as per this bedside swallow evaluation. Patient had eyes closed when SLP arrived but she would open them to voice. Her daughter was in the room and provided history, stating that patient does not have h/o dysphagia but that she hasn't eating anything since this past Tuesday. After elevating HOB, patient became more alert. She was able to hold cup and drink thin liquids (juice, water) with some assistance to support cup. Initially she exhibited multiple swallows and some mild throat clearing, however after a few sips, her swallow appeared more normal. She declined to have any solids and only drank approximately 1 ounce of liquids. SLP recommending continue on current diet, offering her PO's only when fully alert. SLP will follow for diet toleration. SLP Visit Diagnosis: Dysphagia, unspecified (R13.10)    Aspiration Risk  Risk for inadequate nutrition/hydration;Mild aspiration risk    Diet Recommendation Dysphagia 2 (Fine  chop);Thin liquid    Liquid Administration via: Cup;Straw Medication Administration: Whole meds with puree Supervision: Full supervision/cueing for compensatory strategies;Staff to assist with self feeding Compensations: Slow rate;Small sips/bites Postural Changes: Seated upright at 90 degrees    Other  Recommendations Oral Care Recommendations: Oral care BID    Recommendations for follow up therapy are one component  of a multi-disciplinary discharge planning process, led by the attending physician.  Recommendations may be updated based on patient status, additional functional criteria and insurance authorization.  Follow up Recommendations Follow physician's recommendations for discharge plan and follow up therapies      Assistance Recommended at Discharge    Functional Status Assessment Patient has had a recent decline in their functional status and demonstrates the ability to make significant improvements in function in a reasonable and predictable amount of time.  Frequency and Duration min 2x/week  1 week       Prognosis Prognosis for improved oropharyngeal function: Fair Barriers to Reach Goals: Severity of deficits;Cognitive deficits      Swallow Study   General Date of Onset: 07/13/23 HPI: Patient is an 88 y.o. female with PMH: HFpEF, paroxysmal atrial fibrillation, cardiac amyloidosis, breast cancer with brain and liver mets, history of CVA, and chronic indwelling Foley catheter since last admission 7/5-06/23/23 with encephalopathy and declining functional status. She presented to the hospital on 07/11/2023 with AMS, found to have hypercalcemia of malignancy. She has had poor PO intake since Tuesday (7/23) per daughter's report. SLP swallow evaluation ordered. Type of Study: Bedside Swallow Evaluation Previous Swallow Assessment: BSE 03/31/23 which did not show any dysphagia Diet Prior to this Study: Dysphagia 2 (finely chopped);Thin liquids (Level 0) Temperature Spikes Noted: No Respiratory Status: Room air History of Recent Intubation: No Behavior/Cognition: Cooperative;Requires cueing;Lethargic/Drowsy Oral Cavity Assessment: Within Functional Limits Oral Care Completed by SLP: Recent completion by staff Oral Cavity - Dentition: Adequate natural dentition Self-Feeding Abilities: Able to feed self;Needs set up;Needs assist Patient Positioning: Upright in bed Baseline Vocal Quality: Low  vocal intensity Volitional Cough: Cognitively unable to elicit Volitional Swallow: Unable to elicit    Oral/Motor/Sensory Function Overall Oral Motor/Sensory Function: Within functional limits   Ice Chips     Thin Liquid Thin Liquid: Impaired Presentation: Self Fed;Cup Pharyngeal  Phase Impairments: Multiple swallows;Throat Clearing - Delayed    Nectar Thick     Honey Thick     Puree Puree: Not tested   Solid     Solid: Not tested      Angela Nevin, MA, CCC-SLP Speech Therapy

## 2023-07-13 NOTE — Progress Notes (Signed)
PHARMACY - PHYSICIAN COMMUNICATION CRITICAL VALUE ALERT - BLOOD CULTURE IDENTIFICATION (BCID)  Amanda Green is an 88 y.o. female who presented to [facility] on 07/11/2023 with a chief complaint of lethargy and poor oral intake.  Assessment:  1 of 2 bottles. staph epi, meth resistant.   Name of physician (or Provider) Contacted: Liana Crocker  Current antibiotics: CTX and Azith  Changes to prescribed antibiotics recommended:  Probable contaminant, pt afebrile, WBC elevated but on large dose of decadron  Results for orders placed or performed during the hospital encounter of 07/11/23  Blood Culture ID Panel (Reflexed) (Collected: 07/12/2023  5:05 AM)  Result Value Ref Range   Enterococcus faecalis NOT DETECTED NOT DETECTED   Enterococcus Faecium NOT DETECTED NOT DETECTED   Listeria monocytogenes NOT DETECTED NOT DETECTED   Staphylococcus species DETECTED (A) NOT DETECTED   Staphylococcus aureus (BCID) NOT DETECTED NOT DETECTED   Staphylococcus epidermidis DETECTED (A) NOT DETECTED   Staphylococcus lugdunensis NOT DETECTED NOT DETECTED   Streptococcus species NOT DETECTED NOT DETECTED   Streptococcus agalactiae NOT DETECTED NOT DETECTED   Streptococcus pneumoniae NOT DETECTED NOT DETECTED   Streptococcus pyogenes NOT DETECTED NOT DETECTED   A.calcoaceticus-baumannii NOT DETECTED NOT DETECTED   Bacteroides fragilis NOT DETECTED NOT DETECTED   Enterobacterales NOT DETECTED NOT DETECTED   Enterobacter cloacae complex NOT DETECTED NOT DETECTED   Escherichia coli NOT DETECTED NOT DETECTED   Klebsiella aerogenes NOT DETECTED NOT DETECTED   Klebsiella oxytoca NOT DETECTED NOT DETECTED   Klebsiella pneumoniae NOT DETECTED NOT DETECTED   Proteus species NOT DETECTED NOT DETECTED   Salmonella species NOT DETECTED NOT DETECTED   Serratia marcescens NOT DETECTED NOT DETECTED   Haemophilus influenzae NOT DETECTED NOT DETECTED   Neisseria meningitidis NOT DETECTED NOT DETECTED   Pseudomonas  aeruginosa NOT DETECTED NOT DETECTED   Stenotrophomonas maltophilia NOT DETECTED NOT DETECTED   Candida albicans NOT DETECTED NOT DETECTED   Candida auris NOT DETECTED NOT DETECTED   Candida glabrata NOT DETECTED NOT DETECTED   Candida krusei NOT DETECTED NOT DETECTED   Candida parapsilosis NOT DETECTED NOT DETECTED   Candida tropicalis NOT DETECTED NOT DETECTED   Cryptococcus neoformans/gattii NOT DETECTED NOT DETECTED   Methicillin resistance mecA/C DETECTED (A) NOT DETECTED   Arley Phenix RPh 07/13/2023, 10:46 PM

## 2023-07-14 DIAGNOSIS — N390 Urinary tract infection, site not specified: Secondary | ICD-10-CM | POA: Diagnosis not present

## 2023-07-14 DIAGNOSIS — G9341 Metabolic encephalopathy: Secondary | ICD-10-CM | POA: Diagnosis not present

## 2023-07-14 DIAGNOSIS — T83511A Infection and inflammatory reaction due to indwelling urethral catheter, initial encounter: Secondary | ICD-10-CM | POA: Diagnosis not present

## 2023-07-14 LAB — BASIC METABOLIC PANEL WITH GFR
Anion gap: 11 (ref 5–15)
BUN: 44 mg/dL — ABNORMAL HIGH (ref 8–23)
CO2: 22 mmol/L (ref 22–32)
Calcium: 9.5 mg/dL (ref 8.9–10.3)
Chloride: 106 mmol/L (ref 98–111)
Creatinine, Ser: 1.2 mg/dL — ABNORMAL HIGH (ref 0.44–1.00)
GFR, Estimated: 44 mL/min — ABNORMAL LOW (ref >60)
Glucose, Bld: 96 mg/dL (ref 70–99)
Potassium: 3.6 mmol/L (ref 3.5–5.1)
Sodium: 139 mmol/L (ref 135–145)

## 2023-07-14 LAB — CBC WITH DIFFERENTIAL/PLATELET
Abs Immature Granulocytes: 0.33 10*3/uL — ABNORMAL HIGH (ref 0.00–0.07)
Basophils Absolute: 0 10*3/uL (ref 0.0–0.1)
Basophils Relative: 0 %
Eosinophils Absolute: 0 10*3/uL (ref 0.0–0.5)
Eosinophils Relative: 0 %
HCT: 27.1 % — ABNORMAL LOW (ref 36.0–46.0)
Hemoglobin: 8.9 g/dL — ABNORMAL LOW (ref 12.0–15.0)
Immature Granulocytes: 2 %
Lymphocytes Relative: 6 %
Lymphs Abs: 0.8 10*3/uL (ref 0.7–4.0)
MCH: 27.9 pg (ref 26.0–34.0)
MCHC: 32.8 g/dL (ref 30.0–36.0)
MCV: 85 fL (ref 80.0–100.0)
Monocytes Absolute: 0.9 10*3/uL (ref 0.1–1.0)
Monocytes Relative: 6 %
Neutro Abs: 12 10*3/uL — ABNORMAL HIGH (ref 1.7–7.7)
Neutrophils Relative %: 86 %
Platelets: 319 10*3/uL (ref 150–400)
RBC: 3.19 MIL/uL — ABNORMAL LOW (ref 3.87–5.11)
RDW: 19.8 % — ABNORMAL HIGH (ref 11.5–15.5)
WBC: 14 10*3/uL — ABNORMAL HIGH (ref 4.0–10.5)
nRBC: 0 % (ref 0.0–0.2)

## 2023-07-14 LAB — GLUCOSE, CAPILLARY
Glucose-Capillary: 95 mg/dL (ref 70–99)
Glucose-Capillary: 94 mg/dL (ref 70–99)
Glucose-Capillary: 110 mg/dL — ABNORMAL HIGH (ref 70–99)
Glucose-Capillary: 109 mg/dL — ABNORMAL HIGH (ref 70–99)
Glucose-Capillary: 91 mg/dL (ref 70–99)

## 2023-07-14 MED ORDER — SODIUM CHLORIDE 0.9 % IV SOLN: INTRAVENOUS | Status: DC

## 2023-07-14 MED ORDER — BRIMONIDINE TARTRATE 0.2 % OP SOLN
1.0000 [drp] | Freq: Every day | OPHTHALMIC | Status: DC
  Administered 2023-07-14 – 2023-07-18 (×5): 1 [drp] via OPHTHALMIC
  Filled 2023-07-14: qty 5

## 2023-07-14 MED ORDER — METOPROLOL TARTRATE 50 MG PO TABS
50.0000 mg | ORAL_TABLET | Freq: Two times a day (BID) | ORAL | Status: DC
  Administered 2023-07-14 – 2023-07-15 (×3): 50 mg via ORAL
  Filled 2023-07-14 (×4): qty 1

## 2023-07-14 MED ORDER — AMIODARONE HCL 200 MG PO TABS
100.0000 mg | ORAL_TABLET | Freq: Every day | ORAL | Status: DC
  Administered 2023-07-15 – 2023-07-16 (×2): 100 mg via ORAL
  Filled 2023-07-14 (×2): qty 1

## 2023-07-14 MED ORDER — MEDIHONEY WOUND/BURN DRESSING EX PSTE
1.0000 | PASTE | Freq: Every day | CUTANEOUS | Status: DC
  Administered 2023-07-14 – 2023-07-18 (×5): 1 via TOPICAL
  Filled 2023-07-14 (×2): qty 44

## 2023-07-14 MED ORDER — MEMANTINE HCL 10 MG PO TABS
10.0000 mg | ORAL_TABLET | Freq: Two times a day (BID) | ORAL | Status: DC
  Administered 2023-07-14 – 2023-07-18 (×8): 10 mg via ORAL
  Filled 2023-07-14 (×8): qty 1

## 2023-07-14 MED ORDER — ENOXAPARIN SODIUM 30 MG/0.3ML IJ SOSY
30.0000 mg | PREFILLED_SYRINGE | INTRAMUSCULAR | Status: DC
  Administered 2023-07-14 – 2023-07-15 (×2): 30 mg via SUBCUTANEOUS
  Filled 2023-07-14 (×2): qty 0.3

## 2023-07-14 MED ORDER — LACTATED RINGERS IV BOLUS
500.0000 mL | Freq: Once | INTRAVENOUS | Status: AC
  Administered 2023-07-14: 500 mL via INTRAVENOUS

## 2023-07-14 MED ORDER — ACETAMINOPHEN 325 MG PO TABS
650.0000 mg | ORAL_TABLET | Freq: Four times a day (QID) | ORAL | Status: AC | PRN
  Administered 2023-07-14 – 2023-07-15 (×2): 650 mg via ORAL
  Filled 2023-07-14 (×2): qty 2

## 2023-07-14 NOTE — Consult Note (Addendum)
WOC Nurse Consult Note: Reason for Consult:Stage 3 PI to right heel, last seen by this writer on 06/30/23, 06/23/23. A stage 2 PI is also POA. Consult is performed remotely after review of the EMR. Wound type:Pressure Pressure Injury POA: Yes Measurement:3cm x 2cm x 0.2cm with nonviable center Wound bed:As noted above Drainage (amount, consistency, odor) small light yellow consistent with autolytically debriding nonviable tissue and Medihoney Periwound:intact Dressing procedure/placement/frequency:  I have continued the POC initiated during her last admission I.e., using Medihoney to the right heel once daily. Heels are to be placed into Prevalon boots. Turning and repositioning is to minimize time in the supine position.   Unfortunately, with the degree of frailty and the comorbid conditions on record, this wound is not expected to heal and is considered a palliative wound. Topical care is appropriate and is to be used in conjunction with offloading.  WOC nursing team will not follow, but will remain available to this patient, the nursing and medical teams.  Please re-consult if needed.  Thank you for inviting [place] to participate in this patient's Plan of Care.  Ladona Mow, MSN, RN, CNS, GNP, Leda Min, [employer], [employer] phone:  [phone]

## 2023-07-14 NOTE — Progress Notes (Signed)
[facility]                                                                               Amanda Green, is a 88 y.o. female, DOB - 03-Dec-1935, [id number] Admit date - 07/11/2023    Outpatient Primary MD for the patient is [name], [name], MD  LOS - 3  days    Brief summary    88 y.o. female with medical history significant for breast cancer with metastasis to the brain, dementia, chronic HFpEF, who presented from home due to lethargy and poor oral intake for the past few days.  She had multiple admissions this month for similar complaints.   CT head shows  1. No acute intracranial process. 2. Stable left occipital lobe lesion with surrounding vasogenic edema. 3. Stable periventricular white matter hypodensity, likely chronic small vessel ischemic changes. 4. Stable small mass in the right lateral ventricle.  Assessment & Plan    Assessment and Plan:  Acute metabolic encephalopathy sec to hypoglycemia, pneumonia and UTI in the setting of malignancy with brain mets     UTI in the setting of chronic foley catheter: Completed the ceftriaxone.    Left breast invasive carcinoma with underlying DCIS/ bilateral mastectomies.  With metastatic lesions to the brain and about 40 metastatic lesions in the liver evident on CT from 06/18/23.  Unable to start the Herceptin injections due to insurance issues.  She follows up with Dr Pamelia Hoit in the office.  In view of her deconditioning , hepatic encephalopathy and new metastatic lesions, palliative care consulted .    Dementia  Continue with namenda.       Chronic HFpEF secondary to TTR amyloidosis:  She appears dehydrated.    Paroxysmal atrial fibrillation:  Rate controlled with metoprolol 50 mg BID.  Not on anti coagulation due to brain mets and increased risk of bleeding.    H/o CVA     Type 2 DM:  Diet controlled.  A1c is 6.5 % in 2 /2024. CBG (last 3)  Recent Labs    07/14/23 0441 07/14/23 0741  07/14/23 1123  GLUCAP 95 94 110*   Continue with SSI.    Stage III pressure ulcer noted to the right heel-present on admission Stage II  pressure ulcer noted to the sacral area-present on admission Wound care consulted we appreciate input   Hypercalcemia likely related to malignancy-improving Presented with calcium of 10.6 with albumin 2.3 corrected calcium of 11.8 Continue calcitonin and Zometa as recommended by palliative physician.   Brain cancer with metastasis to the brain CT scan of the brain showing metastatic disease Palliative care initiated patient on Keppra Continue IV Decadron for vasogenic edema      Acute AKI: Hydrate and repeat renal parameters in am.   RN Pressure Injury Documentation: Pressure Injury 03/09/23 Buttocks Right Stage 2 -  Partial thickness loss of dermis presenting as a shallow open injury with a red, pink wound bed without slough. less than 1cm size. pink/red (Active)  03/09/23 1854  Location: Buttocks  Location Orientation: Right  Staging: Stage 2 -  Partial thickness loss of dermis presenting as a shallow open injury with a red, pink  wound bed without slough.  Wound Description (Comments): less than 1cm size. pink/red  Present on Admission: Yes  Dressing Type Foam - Lift dressing to assess site every shift 07/03/23 0900     Pressure Injury 06/22/23 Heel Right Stage 3 -  Full thickness tissue loss. Subcutaneous fat may be visible but bone, tendon or muscle are NOT exposed. (Active)  06/22/23 0925  Location: Heel  Location Orientation: Right  Staging: Stage 3 -  Full thickness tissue loss. Subcutaneous fat may be visible but bone, tendon or muscle are NOT exposed.  Wound Description (Comments):   Present on Admission: Yes  Dressing Type Foam - Lift dressing to assess site every shift 07/14/23 1300     Pressure Injury 06/29/23 Sacrum Right;Left;Medial Stage 2 -  Partial thickness loss of dermis presenting as a shallow open injury with a red,  pink wound bed without slough. (Active)  06/29/23 2340  Location: Sacrum  Location Orientation: Right;Left;Medial  Staging: Stage 2 -  Partial thickness loss of dermis presenting as a shallow open injury with a red, pink wound bed without slough.  Wound Description (Comments):   Present on Admission: Yes  Dressing Type Foam - Lift dressing to assess site every shift 07/14/23 1300      Estimated body mass index is 24.18 kg/m as calculated from the following:   Height as of this encounter: 5' 2.99" (1.6 m).   Weight as of this encounter: 61.9 kg.  Code Status: DNR DVT Prophylaxis:  enoxaparin (LOVENOX) injection 30 mg Start: 07/14/23 2200   Level of Care: Level of care: Progressive Family Communication: Updated patient's family at bedside.   Disposition Plan:     Remains inpatient appropriate:  IV fluids.   Procedures:  None.   Consultants:   Palliative care  Antimicrobials:   Anti-infectives (From admission, onward)    Start     Dose/Rate Route Frequency Ordered Stop   07/12/23 1000  cefTRIAXone (ROCEPHIN) 2 g in sodium chloride 0.9 % 100 mL IVPB        2 g 200 mL/hr over 30 Minutes Intravenous Every 24 hours 07/12/23 0008     07/12/23 1000  azithromycin (ZITHROMAX) 500 mg in sodium chloride 0.9 % 250 mL IVPB        500 mg 250 mL/hr over 60 Minutes Intravenous Every 24 hours 07/12/23 0931 07/14/23 1046   07/11/23 2200  cefTRIAXone (ROCEPHIN) 1 g in sodium chloride 0.9 % 100 mL IVPB        1 g 200 mL/hr over 30 Minutes Intravenous  Once 07/11/23 2146 07/11/23 2304        Medications  Scheduled Meds:  calcitonin (salmon)  1 spray Alternating Nares Daily   Chlorhexidine Gluconate Cloth  6 each Topical Daily   dexamethasone (DECADRON) injection  8 mg Intravenous Q24H   enoxaparin (LOVENOX) injection  30 mg Subcutaneous Q24H   levETIRAcetam  250 mg Oral BID    morphine injection  1 mg Intravenous Once   mouth rinse  15 mL Mouth Rinse 4 times per day    Continuous Infusions:  sodium chloride 100 mL/hr at 07/14/23 1106   cefTRIAXone (ROCEPHIN)  IV 2 g (07/14/23 0903)   PRN Meds:.acetaminophen, melatonin, mouth rinse, polyethylene glycol, prochlorperazine    Subjective:   Amanda Green was seen and examined today. Denies any pain, sleepy. Family at bedside, wants to know if she needs a NG tube for nutrition.   Objective:   Vitals:   07/14/23 1200 07/14/23  1217 07/14/23 1300 07/14/23 1327  BP:      Pulse:      Resp: 20 13 16 19   Temp:      TempSrc:      SpO2:      Weight:      Height:        Intake/Output Summary (Last 24 hours) at 07/14/2023 1344 Last data filed at 07/14/2023 0930 Gross per 24 hour  Intake 20 ml  Output --  Net 20 ml   Filed Weights   07/12/23 0412  Weight: 61.9 kg     Exam General exam: ill appearing lady, moaning , not in distress.  Respiratory system: Clear to auscultation. Respiratory effort normal. Cardiovascular system: S1 & S2 heard, RRR.  Gastrointestinal system: Abdomen is nondistended, soft and nontender. Central nervous system: Alert and oriented. No focal neurological deficits. Extremities: no pedal edema.  Skin: no rashes.   Data Reviewed:  I have personally reviewed following labs and imaging studies   CBC Lab Results  Component Value Date   WBC 14.0 (H) 07/14/2023   RBC 3.19 (L) 07/14/2023   HGB 8.9 (L) 07/14/2023   HCT 27.1 (L) 07/14/2023   MCV 85.0 07/14/2023   MCH 27.9 07/14/2023   PLT 319 07/14/2023   MCHC 32.8 07/14/2023   RDW 19.8 (H) 07/14/2023   LYMPHSABS 0.8 07/14/2023   MONOABS 0.9 07/14/2023   EOSABS 0.0 07/14/2023   BASOSABS 0.0 07/14/2023     Last metabolic panel Lab Results  Component Value Date   NA 139 07/14/2023   K 3.6 07/14/2023   CL 106 07/14/2023   CO2 22 07/14/2023   BUN 44 (H) 07/14/2023   CREATININE 1.20 (H) 07/14/2023   GLUCOSE 96 07/14/2023   GFRNONAA 44 (L) 07/14/2023   GFRAA 72 02/07/2021   CALCIUM 9.5 07/14/2023   PHOS 3.1  07/12/2023   PROT 6.1 (L) 07/12/2023   ALBUMIN 2.3 (L) 07/12/2023   LABGLOB 3.4 01/03/2022   AGRATIO 1.5 02/07/2021   BILITOT 1.5 (H) 07/12/2023   ALKPHOS 544 (H) 07/12/2023   AST 162 (H) 07/12/2023   ALT 48 (H) 07/12/2023   ANIONGAP 11 07/14/2023    CBG (last 3)  Recent Labs    07/14/23 0441 07/14/23 0741 07/14/23 1123  GLUCAP 95 94 110*      Coagulation Profile: No results for input(s): "INR", "PROTIME" in the last 168 hours.   Radiology Studies: No results found.     Kathlen Mody M.D. [facility] 07/14/2023, 1:44 PM  Available via Epic secure chat 7am-7pm After 7 pm, please refer to night coverage provider listed on amion.

## 2023-07-15 ENCOUNTER — Telehealth: Payer: Self-pay | Admitting: Hematology and Oncology

## 2023-07-15 DIAGNOSIS — G9341 Metabolic encephalopathy: Secondary | ICD-10-CM | POA: Diagnosis not present

## 2023-07-15 DIAGNOSIS — N390 Urinary tract infection, site not specified: Secondary | ICD-10-CM | POA: Diagnosis not present

## 2023-07-15 DIAGNOSIS — T83511A Infection and inflammatory reaction due to indwelling urethral catheter, initial encounter: Secondary | ICD-10-CM

## 2023-07-15 LAB — GLUCOSE, CAPILLARY
Glucose-Capillary: 71 mg/dL (ref 70–99)
Glucose-Capillary: 92 mg/dL (ref 70–99)
Glucose-Capillary: 84 mg/dL (ref 70–99)
Glucose-Capillary: 97 mg/dL (ref 70–99)

## 2023-07-15 MED ORDER — DEXTROSE 50 % IV SOLN
25.0000 mL | Freq: Once | INTRAVENOUS | Status: AC
  Administered 2023-07-15: 25 mL via INTRAVENOUS
  Filled 2023-07-15: qty 50

## 2023-07-15 NOTE — Progress Notes (Signed)
Met with patient, two daughters and a close family friend.Unfortunately Ms. Fendler continues to decline. She had no significant response to the decdron and correction of hypercalcemia. She is now severely deconditioned. Suspect her confusion is being driven by worsening liver mets and liver failure- high LFTs and Ammonia. She is too weak and debilitated for chemo or radiation. I recommended hospice care and for them to think about how where and how they wanted to care for her at the end of her life-I shared with them that she shows signs of nearing EOL and dying. They asked about a feeding tube and I explained how this was a painful procedure that may make things worse for her that in this situation she will die of her cancer and not starvation or not having nutrition.  They are waiting on another sister to arrive this evening- they understand the medical teams recommendation is for comfort care transition.Will request chaplain support.   Anderson Malta, DO Palliative Medicine  Time: 40 minutes

## 2023-07-15 NOTE — Progress Notes (Signed)
SLP Cancellation Note  Patient Details Name: Nolyn Griffy MRN: [id number] DOB: 08/25/1935   Cancelled treatment:       Reason Eval/Treat Not Completed: Patient's level of consciousness. Patient's two daughters were in the room and patient was asleep when SLP arrived. She would open her eyes briefly and respond but was not alert enough for any PO's. Daughters reporting that the last time patient ate anything was last Wednesday and that during this hospitalization she has only had some sips of liquids. They reported that she has been refusing/declining PO's frequently. SLP discussed with them plan to continue on current PO diet but that no further skilled services warranted at this time. SLP recommends PO's for comfort when patient adequately awake/alert.   Angela Nevin, MA, CCC-SLP Speech Therapy

## 2023-07-15 NOTE — Progress Notes (Addendum)
[facility]                                                                               Amanda Green, is a 88 y.o. female, DOB - 11-May-1935, AOZ:[id number] Admit date - 07/11/2023    Outpatient Primary MD for the patient is Amanda Green, Amanda Knudsen, MD  LOS - 4  days    Brief summary    88 y.o. female with medical history significant for breast cancer with metastasis to the brain, dementia, chronic HFpEF, who presented from home due to lethargy and poor oral intake for the past few days.  She had multiple admissions this month for similar complaints.   CT head shows  1. No acute intracranial process. 2. Stable left occipital lobe lesion with surrounding vasogenic edema. 3. Stable periventricular white matter hypodensity, likely chronic small vessel ischemic changes. 4. Stable small mass in the right lateral ventricle.  She was admitted for acute encephalopathy, generalized weakness, poor oral intake.  Suspect from progression of her malignancy, rapid decline and approaching end of life. Family also requesting to speak to palliative care.   Assessment & Plan    Assessment and Plan:  Acute metabolic encephalopathy sec to hypoglycemia, pneumonia and UTI in the setting of malignancy with brain mets     UTI in the setting of chronic foley catheter: Completed the ceftriaxone.    Left breast invasive carcinoma with underlying DCIS/ bilateral mastectomies.  With metastatic lesions to the brain and about 40 metastatic lesions in the liver evident on CT from 06/18/23.  Unable to start the Herceptin injections due to insurance issues.  She follows up with Dr Amanda Green in the office.  In view of her deconditioning , hepatic encephalopathy and new metastatic lesions, palliative care consulted .    Dementia  Continue with namenda.       Chronic HFpEF secondary to TTR amyloidosis:  She appears dehydrated. Recommend to continue with IV fludis.    Paroxysmal atrial  fibrillation:  Rate controlled with metoprolol 50 mg BID.  Not on anti coagulation due to brain mets and increased risk of bleeding.    H/o CVA     Type 2 DM:  Diet controlled.  A1c is 6.5 % in 2 /2024. CBG (last 3)  Recent Labs    07/14/23 2152 07/15/23 0753 07/15/23 1122  GLUCAP 91 71 92   Continue with SSI.    Stage III pressure ulcer noted to the right heel-present on admission Stage II  pressure ulcer noted to the sacral area-present on admission Wound care consulted we appreciate input   Hypercalcemia likely related to malignancy-improving Presented with calcium of 10.6 with albumin 2.3 corrected calcium of 11.8 Continue calcitonin and Zometa as recommended by palliative physician. Calcium improved to 8.6   Brain cancer with metastasis to the brain CT scan of the brain showing metastatic disease Palliative care initiated patient on Keppra Continue IV Decadron for vasogenic edema    Multiple episodes of hypoglycemia:  From poor oral intake.  Pt refusing the NG Tube, but daughter at bedside requesting to see if NGt ube will be beneficial.  She would like to talk with palliative regarding  the nutrition.    Acute AKI: Creatinine improved to 1.06 with IV fluids.   RN Pressure Injury Documentation: Pressure Injury 06/22/23 Heel Right Stage 3 -  Full thickness tissue loss. Subcutaneous fat may be visible but bone, tendon or muscle are NOT exposed. (Active)  06/22/23 0925  Location: Heel  Location Orientation: Right  Staging: Stage 3 -  Full thickness tissue loss. Subcutaneous fat may be visible but bone, tendon or muscle are NOT exposed.  Wound Description (Comments):   Present on Admission: Yes  Dressing Type Gauze (Comment);Other (Comment) 07/14/23 2300     Pressure Injury 06/29/23 Sacrum Right;Left;Medial Stage 2 -  Partial thickness loss of dermis presenting as a shallow open injury with a red, pink wound bed without slough. (Active)  06/29/23 2340   Location: Sacrum  Location Orientation: Right;Left;Medial  Staging: Stage 2 -  Partial thickness loss of dermis presenting as a shallow open injury with a red, pink wound bed without slough.  Wound Description (Comments):   Present on Admission: Yes  Dressing Type Foam - Lift dressing to assess site every shift 07/14/23 2000      Estimated body mass index is 24.18 kg/m as calculated from the following:   Height as of this encounter: 5' 2.99" (1.6 m).   Weight as of this encounter: 61.9 kg.  Code Status: DNR DVT Prophylaxis:  enoxaparin (LOVENOX) injection 30 mg Start: 07/14/23 2200   Level of Care: Level of care: Progressive Family Communication: Updated patient's family at bedside.   Disposition Plan:     Remains inpatient appropriate:  IV fluids.   Procedures:  None.   Consultants:   Palliative care  Antimicrobials:   Anti-infectives (From admission, onward)    Start     Dose/Rate Route Frequency Ordered Stop   07/12/23 1000  cefTRIAXone (ROCEPHIN) 2 g in sodium chloride 0.9 % 100 mL IVPB  Status:  Discontinued        2 g 200 mL/hr over 30 Minutes Intravenous Every 24 hours 07/12/23 0008 07/14/23 1539   07/12/23 1000  azithromycin (ZITHROMAX) 500 mg in sodium chloride 0.9 % 250 mL IVPB        500 mg 250 mL/hr over 60 Minutes Intravenous Every 24 hours 07/12/23 0931 07/14/23 1911   07/11/23 2200  cefTRIAXone (ROCEPHIN) 1 g in sodium chloride 0.9 % 100 mL IVPB        1 g 200 mL/hr over 30 Minutes Intravenous  Once 07/11/23 2146 07/11/23 2304        Medications  Scheduled Meds:  amiodarone  100 mg Oral Daily   brimonidine  1 drop Both Eyes Daily   calcitonin (salmon)  1 spray Alternating Nares Daily   Chlorhexidine Gluconate Cloth  6 each Topical Daily   dexamethasone (DECADRON) injection  8 mg Intravenous Q24H   enoxaparin (LOVENOX) injection  30 mg Subcutaneous Q24H   leptospermum manuka honey  1 Application Topical Daily   levETIRAcetam  250 mg Oral BID    memantine  10 mg Oral BID   metoprolol tartrate  50 mg Oral BID   mouth rinse  15 mL Mouth Rinse 4 times per day   Continuous Infusions:  sodium chloride 100 mL/hr at 07/15/23 0400   PRN Meds:.acetaminophen, acetaminophen, mouth rinse, polyethylene glycol, prochlorperazine    Subjective:   Amanda Green was seen and examined today. Remains lethargic.   Objective:   Vitals:   07/14/23 1700 07/14/23 1800 07/14/23 2142 07/15/23 0551  BP:   116/62  126/67  Pulse:   83 62  Resp: 15 19 16 16   Temp:   97.8 F (36.6 C) 97.7 F (36.5 C)  TempSrc:   Oral Oral  SpO2:   100% 99%  Weight:      Height:        Intake/Output Summary (Last 24 hours) at 07/15/2023 1223 Last data filed at 07/15/2023 8469 Gross per 24 hour  Intake 1786.67 ml  Output 450 ml  Net 1336.67 ml   Filed Weights   07/12/23 0412  Weight: 61.9 kg     Exam General exam: elderly lady ill appearing, not in distress.  Respiratory system: Clear to auscultation. Respiratory effort normal. Cardiovascular system: S1 & S2 heard, RRR. No JVD Gastrointestinal system: Abdomen is nondistended, soft and nontender. Central nervous system: lethargic,  Extremities: no pedal edema.  Skin: No rashes,  Psychiatry: unable to assess.   Data Reviewed:  I have personally reviewed following labs and imaging studies   CBC Lab Results  Component Value Date   WBC 11.7 (H) 07/15/2023   RBC 2.97 (L) 07/15/2023   HGB 8.5 (L) 07/15/2023   HCT 25.6 (L) 07/15/2023   MCV 86.2 07/15/2023   MCH 28.6 07/15/2023   PLT 255 07/15/2023   MCHC 33.2 07/15/2023   RDW 20.6 (H) 07/15/2023   LYMPHSABS 0.7 07/15/2023   MONOABS 0.7 07/15/2023   EOSABS 0.0 07/15/2023   BASOSABS 0.0 07/15/2023     Last metabolic panel Lab Results  Component Value Date   NA 141 07/15/2023   K 4.1 07/15/2023   CL 112 (H) 07/15/2023   CO2 20 (L) 07/15/2023   BUN 42 (H) 07/15/2023   CREATININE 1.06 (H) 07/15/2023   GLUCOSE 67 (L) 07/15/2023   GFRNONAA  51 (L) 07/15/2023   GFRAA 72 02/07/2021   CALCIUM 8.6 (L) 07/15/2023   PHOS 3.1 07/12/2023   PROT 6.1 (L) 07/12/2023   ALBUMIN 2.3 (L) 07/12/2023   LABGLOB 3.4 01/03/2022   AGRATIO 1.5 02/07/2021   BILITOT 1.5 (H) 07/12/2023   ALKPHOS 544 (H) 07/12/2023   AST 162 (H) 07/12/2023   ALT 48 (H) 07/12/2023   ANIONGAP 9 07/15/2023    CBG (last 3)  Recent Labs    07/14/23 2152 07/15/23 0753 07/15/23 1122  GLUCAP 91 71 92      Coagulation Profile: No results for input(s): "INR", "PROTIME" in the last 168 hours.   Radiology Studies: No results found.     Amanda Green M.D. [facility] 07/15/2023, 12:23 PM  Available via Epic secure chat 7am-7pm After 7 pm, please refer to night coverage provider listed on amion.

## 2023-07-15 NOTE — Plan of Care (Signed)

## 2023-07-16 ENCOUNTER — Inpatient Hospital Stay: Payer: Medicare PPO

## 2023-07-16 DIAGNOSIS — N390 Urinary tract infection, site not specified: Secondary | ICD-10-CM | POA: Diagnosis not present

## 2023-07-16 DIAGNOSIS — G9341 Metabolic encephalopathy: Secondary | ICD-10-CM | POA: Diagnosis not present

## 2023-07-16 DIAGNOSIS — T83511A Infection and inflammatory reaction due to indwelling urethral catheter, initial encounter: Secondary | ICD-10-CM | POA: Diagnosis not present

## 2023-07-16 LAB — GLUCOSE, CAPILLARY
Glucose-Capillary: 149 mg/dL — ABNORMAL HIGH (ref 70–99)
Glucose-Capillary: 53 mg/dL — ABNORMAL LOW (ref 70–99)
Glucose-Capillary: 81 mg/dL (ref 70–99)
Glucose-Capillary: 125 mg/dL — ABNORMAL HIGH (ref 70–99)

## 2023-07-16 MED ORDER — DEXTROSE 50 % IV SOLN
25.0000 g | INTRAVENOUS | Status: AC
  Administered 2023-07-16: 25 g via INTRAVENOUS

## 2023-07-16 MED ORDER — ENOXAPARIN SODIUM 40 MG/0.4ML IJ SOSY
40.0000 mg | PREFILLED_SYRINGE | INTRAMUSCULAR | Status: DC
  Administered 2023-07-16: 40 mg via SUBCUTANEOUS
  Filled 2023-07-16 (×2): qty 0.4

## 2023-07-16 MED ORDER — DEXTROSE 50 % IV SOLN
INTRAVENOUS | Status: AC
  Filled 2023-07-16: qty 50

## 2023-07-16 NOTE — Progress Notes (Signed)
Hypoglycemic Event  CBG: 53   Treatment: D50 50 mL (25 gm)  Symptoms: None  Follow-up CBG: Time:0801 CBG Result:149  Possible Reasons for Event: Inadequate meal intake  Comments/MD notified:Yes    Dawsyn Ramsaran C Kristeen Lantz

## 2023-07-16 NOTE — Progress Notes (Signed)
[facility]                                                                               Amanda Green, is a 88 y.o. female, DOB - July 29, 1935, NFA:[id number] Admit date - 07/11/2023    Outpatient Primary MD for the patient is [name], [name], MD  LOS - 5  days    Brief summary    88 y.o. female with medical history significant for breast cancer with metastasis to the brain, dementia, chronic HFpEF, who presented from home due to lethargy and poor oral intake for the past few days.  She had multiple admissions this month for similar complaints.   CT head shows  1. No acute intracranial process. 2. Stable left occipital lobe lesion with surrounding vasogenic edema. 3. Stable periventricular white matter hypodensity, likely chronic small vessel ischemic changes. 4. Stable small mass in the right lateral ventricle.  She was admitted for acute encephalopathy, generalized weakness, poor oral intake.  Suspect from progression of her malignancy, rapid decline and approaching end of life. Family also requesting to speak to palliative care/ oncology.  Plan to discharge her home with hospice once arrangements have been done.   Assessment & Plan    Assessment and Plan:  Acute metabolic encephalopathy sec to hypoglycemia, UTI in the setting of malignancy with brain mets. She is more alert today and saying yes and no answers.      UTI in the setting of chronic foley catheter: Completed  ceftriaxone.    Left breast invasive carcinoma with underlying DCIS/ bilateral mastectomies.  With metastatic lesions to the brain and about 40 metastatic lesions in the liver evident on CT from 06/18/23.  Unable to start the Herceptin injections due to insurance issues.  In view of her deconditioning , hepatic encephalopathy and new metastatic lesions, palliative care consulted . Plan to discharge home with hospice.    Dementia  Continue with namenda.       Chronic HFpEF secondary  to TTR amyloidosis:  Euvolemic today.    Paroxysmal atrial fibrillation:  Rate between 50 to 60/min, with borderline BP's . Holding metoprolol and amiodarone.   Not on anti coagulation due to brain mets and increased risk of bleeding.    H/o CVA     Type 2 DM uncontrolled with hypoglycemia due to poor oral intake.   A1c is 6.5 % in 2 /2024. CBG (last 3)  Recent Labs    07/16/23 0740 07/16/23 0808 07/16/23 1200  GLUCAP 53* 149* 81     Stage III pressure ulcer noted to the right heel-present on admission Stage II  pressure ulcer noted to the sacral area-present on admission Wound care consulted.   Hypercalcemia likely related to malignancy-improving Presented with calcium of 10.6 with albumin 2.3 corrected calcium of 11.8 Continue calcitonin and Zometa as recommended by palliative physician. Calcium improved to 8.6   Brain cancer with metastasis to the brain CT scan of the brain showing metastatic disease On keppra for prophylaxis.     Multiple episodes of hypoglycemia:  From poor oral intake.  Prn dextrose infusions.    AKI: From poor oral intake/ Dehydration.  Creatinine back  to baseline with IV fluids.    Leukocytosis:   Completed antibiotics. Reactive ? She denies any abd pain, cough or chest pain or fever.  Monitor.   RN Pressure Injury Documentation: Pressure Injury 06/22/23 Heel Right Stage 3 -  Full thickness tissue loss. Subcutaneous fat may be visible but bone, tendon or muscle are NOT exposed. (Active)  06/22/23 0925  Location: Heel  Location Orientation: Right  Staging: Stage 3 -  Full thickness tissue loss. Subcutaneous fat may be visible but bone, tendon or muscle are NOT exposed.  Wound Description (Comments):   Present on Admission: Yes  Dressing Type Gauze (Comment) 07/15/23 2100     Pressure Injury 06/29/23 Sacrum Right;Left;Medial Stage 2 -  Partial thickness loss of dermis presenting as a shallow open injury with a red, pink wound bed  without slough. (Active)  06/29/23 2340  Location: Sacrum  Location Orientation: Right;Left;Medial  Staging: Stage 2 -  Partial thickness loss of dermis presenting as a shallow open injury with a red, pink wound bed without slough.  Wound Description (Comments):   Present on Admission: Yes  Dressing Type Foam - Lift dressing to assess site every shift 07/15/23 2100      Estimated body mass index is 24.18 kg/m as calculated from the following:   Height as of this encounter: 5' 2.99" (1.6 m).   Weight as of this encounter: 61.9 kg.  Code Status: DNR DVT Prophylaxis:  enoxaparin (LOVENOX) injection 40 mg Start: 07/16/23 2200   Level of Care: Level of care: Progressive Family Communication: Updated patient's family at bedside.   Disposition Plan:     Remains inpatient appropriate:  IV fluids. Plan for home with hospice when arranged.   Procedures:  None.   Consultants:   Palliative care  Antimicrobials:   Anti-infectives (From admission, onward)    Start     Dose/Rate Route Frequency Ordered Stop   07/12/23 1000  cefTRIAXone (ROCEPHIN) 2 g in sodium chloride 0.9 % 100 mL IVPB  Status:  Discontinued        2 g 200 mL/hr over 30 Minutes Intravenous Every 24 hours 07/12/23 0008 07/14/23 1539   07/12/23 1000  azithromycin (ZITHROMAX) 500 mg in sodium chloride 0.9 % 250 mL IVPB        500 mg 250 mL/hr over 60 Minutes Intravenous Every 24 hours 07/12/23 0931 07/14/23 1911   07/11/23 2200  cefTRIAXone (ROCEPHIN) 1 g in sodium chloride 0.9 % 100 mL IVPB        1 g 200 mL/hr over 30 Minutes Intravenous  Once 07/11/23 2146 07/11/23 2304        Medications  Scheduled Meds:  brimonidine  1 drop Both Eyes Daily   calcitonin (salmon)  1 spray Alternating Nares Daily   Chlorhexidine Gluconate Cloth  6 each Topical Daily   dexamethasone (DECADRON) injection  8 mg Intravenous Q24H   enoxaparin (LOVENOX) injection  40 mg Subcutaneous Q24H   leptospermum manuka honey  1  Application Topical Daily   levETIRAcetam  250 mg Oral BID   memantine  10 mg Oral BID   mouth rinse  15 mL Mouth Rinse 4 times per day   Continuous Infusions:  sodium chloride 100 mL/hr at 07/16/23 0550   PRN Meds:.acetaminophen, mouth rinse, polyethylene glycol, prochlorperazine    Subjective:   Amanda Green was seen and examined today. No new complaints.   Objective:   Vitals:   07/16/23 0649 07/16/23 1125 07/16/23 1202 07/16/23 1211  BP: Marland Kitchen)  108/57 (!) 106/54 (!) 108/57 (!) 106/54  Pulse: (!) 59 63 64 63  Resp: 17  16   Temp: 97.7 F (36.5 C)  (!) 97.5 F (36.4 C)   TempSrc: Oral  Oral   SpO2: 100%  100%   Weight:      Height:        Intake/Output Summary (Last 24 hours) at 07/16/2023 1218 Last data filed at 07/16/2023 0800 Gross per 24 hour  Intake 2286.14 ml  Output 575 ml  Net 1711.14 ml   Filed Weights   07/12/23 0412  Weight: 61.9 kg     Exam General exam: Ill appearing elderly lady, not in distress.  Respiratory system: Clear to auscultation. Respiratory effort normal. Cardiovascular system: S1 & S2 heard, RRR.  Gastrointestinal system: Abdomen is nondistended, soft and nontender. Central nervous system: lethargic, but opening eyes to answer yes and no questions.  Extremities: No pedal edema.  Skin: No rashes,  Psychiatry:flat affect.    Data Reviewed:  I have personally reviewed following labs and imaging studies   CBC Lab Results  Component Value Date   WBC 11.9 (H) 07/16/2023   RBC 3.45 (L) 07/16/2023   HGB 9.9 (L) 07/16/2023   HCT 30.9 (L) 07/16/2023   MCV 89.6 07/16/2023   MCH 28.7 07/16/2023   PLT 268 07/16/2023   MCHC 32.0 07/16/2023   RDW 21.2 (H) 07/16/2023   LYMPHSABS 0.8 07/16/2023   MONOABS 0.7 07/16/2023   EOSABS 0.0 07/16/2023   BASOSABS 0.0 07/16/2023     Last metabolic panel Lab Results  Component Value Date   NA 141 07/16/2023   K 3.6 07/16/2023   CL 114 (H) 07/16/2023   CO2 16 (L) 07/16/2023   BUN 40 (H)  07/16/2023   CREATININE 0.94 07/16/2023   GLUCOSE 63 (L) 07/16/2023   GFRNONAA 59 (L) 07/16/2023   GFRAA 72 02/07/2021   CALCIUM 8.3 (L) 07/16/2023   PHOS 3.1 07/12/2023   PROT 6.1 (L) 07/12/2023   ALBUMIN 2.3 (L) 07/12/2023   LABGLOB 3.4 01/03/2022   AGRATIO 1.5 02/07/2021   BILITOT 1.5 (H) 07/12/2023   ALKPHOS 544 (H) 07/12/2023   AST 162 (H) 07/12/2023   ALT 48 (H) 07/12/2023   ANIONGAP 11 07/16/2023    CBG (last 3)  Recent Labs    07/16/23 0740 07/16/23 0808 07/16/23 1200  GLUCAP 53* 149* 81      Coagulation Profile: No results for input(s): "INR", "PROTIME" in the last 168 hours.   Radiology Studies: No results found.     Kathlen Mody M.D. [facility] 07/16/2023, 12:18 PM  Available via Epic secure chat 7am-7pm After 7 pm, please refer to night coverage provider listed on amion.

## 2023-07-16 NOTE — Care Management Important Message (Signed)
Important Message  Patient Details IM Letter given. Name: Amanda Green MRN: [id number] Date of Birth: Jan 23, 1935   Medicare Important Message Given:  Yes     Caren Macadam 07/16/2023, 2:29 PM

## 2023-07-17 DIAGNOSIS — G9341 Metabolic encephalopathy: Secondary | ICD-10-CM | POA: Diagnosis not present

## 2023-07-17 LAB — GLUCOSE, CAPILLARY
Glucose-Capillary: 77 mg/dL (ref 70–99)
Glucose-Capillary: 141 mg/dL — ABNORMAL HIGH (ref 70–99)

## 2023-07-17 MED ORDER — DEXAMETHASONE SODIUM PHOSPHATE 4 MG/ML IJ SOLN
4.0000 mg | INTRAMUSCULAR | Status: DC
  Administered 2023-07-18: 4 mg via INTRAVENOUS
  Filled 2023-07-17: qty 1

## 2023-07-17 MED ORDER — CALCITONIN (SALMON) 200 UNIT/ACT NA SOLN: 1.0000 | NASAL | Status: DC

## 2023-07-17 MED ORDER — BISACODYL 10 MG RE SUPP: 10.0000 mg | Freq: Every day | RECTAL | Status: DC | PRN

## 2023-07-17 MED ORDER — DEXTROSE-SODIUM CHLORIDE 5-0.9 % IV SOLN: INTRAVENOUS | Status: DC

## 2023-07-17 MED ORDER — OXYCODONE HCL 5 MG/5ML PO SOLN: 2.0000 mg | ORAL | Status: DC | PRN

## 2023-07-17 NOTE — Progress Notes (Signed)
PROGRESS NOTE    [name]  BJY:[id number] DOB: May 06, 1935 DOA: 07/11/2023 PCP: Ollen Bowl, MD    Brief Narrative:   Amanda Green is a 88 y.o. female with past medical history significant for chronic diastolic congestive heart failure, dementia, paroxysmal atrial fibrillation, cardiac amyloidosis, breast cancer with brain and liver metastasis, history of CVA, urinary retention with chronic indwelling Foley catheter who presented to [facility] ED on 7/25 with lethargy, poor oral intake, and progressive confusion over the previous few days.  Patient currently lives at home with assistance of her daughters.  Daughter reports patient had stopped eating and EMS was called and transported to the ED for further evaluation.  Patient with 2 admissions over this last month from 7/5 - 7/7 and 7/13 - 7/17 for similar symptoms.  In the ED, patient was weak appearing and lethargic.  WBC 9.1, hemoglobin 10.8, platelet count 419.  Sodium 133, potassium 3.2, chloride 96, CO2 22, glucose 58, BUN 32, creatinine 0.71.  Calcium 10.6 urinalysis unrevealing.  COVID-19 PCR negative.  CT head without contrast with no acute intracranial process, stable left occipital lobe lesion with surrounding vasogenic edema, stable periventricular white matter hypodensity likely chronic small vessel ischemic changes, stable small mass right lateral ventricle.  TRH was consulted for admission for further evaluation and management of adult failure to thrive, altered mental status, and hypercalcemia likely secondary to malignancy.  Assessment & Plan:   Acute metabolic encephalopathy Etiology likely multifactorial in the setting of adult failure to thrive, hypoglycemia, hypercalcemia of malignancy.  Patient continues with poor oral intake and was seen by palliative care with recommendations of transitioning to comfort care/hospice. --Continue supportive care while family discussing recommendations set by  palliative care; unfortunately very poor prognosis  Urinary tract infection, ruled out Chronic Foley catheter Urine culture with no growth, did complete course of IV ceftriaxone while inpatient.  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  Follows with medical oncology, Dr. Pamelia Hoit palliative care outpatient.  Underwent bilateral mastectomies by general surgery and completed radiation therapy which she did not tolerate well.  Received Zometa on 07/12/2023. -- Palliative care following, appreciate assistance -- Ca 10.6>>8.3 -- Keppra 250 mg p.o. twice daily for seizure prophylaxis -- Decadron 8 mg IV every 24 hours -- Calcitonin 1 spray alternating nares daily -- Plan to discharge with home hospice, TOC consulted  Dementia -- Namenda 10 mg p.o. twice daily  Type 2 diabetes mellitus Hypoglycemia Hemoglobin A1c 6.21 January 2023, well-controlled with diet.  Patient with hyperglycemia with glucose 58 on admission likely secondary to poor oral intake.  Supported with IV fluids/dextrose infusion.  Remains at risk given poor oral intake but now transitioning likely hospice care.  Paroxysmal atrial fibrillation With borderline blood pressure holding metoprolol and amiodarone, rate controlled.  Not on anticoagulation due to brain metastasis and increased risk of bleeding.  Adult failure to thrive Likely secondary to progressive malignancy with metastasis.  Remains with poor oral intake with multiple hospitalizations in the last month.  Seen by palliative care and plan to transition to home hospice.   Stage III right heel pressure injury, POA Stage II right sacral pressure injury, POA Pressure Injury 06/22/23 Heel Right Stage 3 -  Full thickness tissue loss. Subcutaneous fat may be visible but bone, tendon or muscle are NOT exposed.  (Active)  06/22/23 0865  Location:  Heel  Location Orientation: Right  Staging: Stage 3 -  Full thickness tissue loss. Subcutaneous fat may be visible but bone, tendon or muscle are NOT exposed.  Wound Description (Comments):   Present on Admission: Yes     Pressure Injury 06/29/23 Sacrum Right;Left;Medial Stage 2 -  Partial thickness loss of dermis presenting as a shallow open injury with a red, pink wound bed without slough. (Active)  06/29/23 2340  Location: Sacrum  Location Orientation: Right;Left;Medial  Staging: Stage 2 -  Partial thickness loss of dermis presenting as a shallow open injury with a red, pink wound bed without slough.  Wound Description (Comments):   Present on Admission: Yes  -- Evaluated by wound RN, continue offloading       DVT prophylaxis: enoxaparin (LOVENOX) injection 40 mg Start: 07/16/23 2200    Code Status: DNR Family Communication: Updated daughter present at bedside this morning  Disposition Plan:  Level of care: Progressive Status is: Inpatient Remains inpatient appropriate because: [facility] consulted for home hospice, once services set up stable for discharge home    Consultants:  Palliative care  Procedures:  None  Antimicrobials:  Ceftriaxone 7/25 - 7/28   Subjective: Patient seen examined at bedside, sleeping, poorly interactive but will open her eyes.  Daughter present at bedside, just arrived from her home in [place].  Daughter reports that her other 2 sisters have been alternating care 24/7 in the patient's home.  Discussed with daughter that overall prognosis is extremely poor with recommendations for transitioning to home hospice.  Awaiting for other daughters to arrive for family discussion.  Discussed with palliative care this afternoon.  Unable to obtain any further ROS from patient given her mental status.  No acute concerns overnight per nursing staff, discussed with RN this morning.  Objective: Vitals:   07/16/23 1211  07/16/23 2112 07/17/23 0618 07/17/23 1306  BP: (!) 106/54 (!) 109/54 126/68 (!) 114/55  Pulse: 63 69 73 77  Resp:  16 16   Temp:  98.2 F (36.8 C) 98.4 F (36.9 C) 98.4 F (36.9 C)  TempSrc:  Oral Oral Oral  SpO2:  98% 100% 99%  Weight:      Height:        Intake/Output Summary (Last 24 hours) at 07/17/2023 1816 Last data filed at 07/17/2023 0500 Gross per 24 hour  Intake 1721.83 ml  Output 650 ml  Net 1071.83 ml   Filed Weights   07/12/23 0412  Weight: 61.9 kg    Examination:  Physical Exam: GEN: NAD, somnolent but arousable, opens eyes to command HEENT: NCAT, PERRL, EOMI, sclera clear, dry mucous membranes PULM: CTAB w/o wheezes/crackles, normal respiratory effort, on room air CV: RRR w/o M/G/R GI: abd soft, NTND, NABS, no R/G/M MSK: no peripheral edema    Data Reviewed: I have personally reviewed following labs and imaging studies  CBC: Recent Labs  Lab 07/11/23 1856 07/12/23 0505 07/13/23 0550 07/14/23 0546 07/15/23 0501 07/16/23 0501  WBC 9.1 10.6* 17.0* 14.0* 11.7* 11.9*  NEUTROABS 7.1  --  14.0* 12.0* 10.1* 9.9*  HGB 10.8* 10.3* 8.8* 8.9* 8.5* 9.9*  HCT 32.8* 31.5* 26.2* 27.1* 25.6* 30.9*  MCV 87.5 86.3 84.5 85.0 86.2 89.6  PLT 419* 374 347 319 255 268   Basic Metabolic Panel: Recent Labs  Lab 07/12/23 0505 07/13/23 0550 07/14/23 0546 07/15/23 0501 07/16/23 0501  NA 134* 140 139 141 141  K 3.3* 3.7 3.6 4.1 3.6  CL 99 106 106 112* 114*  CO2  21* 22 22 20* 16*  GLUCOSE 130* 144* 96 67* 63*  BUN 30* 36* 44* 42* 40*  CREATININE 0.65 0.82 1.20* 1.06* 0.94  CALCIUM 10.4* 10.0 9.5 8.6* 8.3*  MG 2.0  --   --   --   --   PHOS 3.1  --   --   --   --    GFR: Estimated Creatinine Clearance: 35.5 mL/min (by C-G formula based on SCr of 0.94 mg/dL). Liver Function Tests: Recent Labs  Lab 07/11/23 1856 07/12/23 0505  AST 163* 162*  ALT 48* 48*  ALKPHOS 571* 544*  BILITOT 1.7* 1.5*  PROT 6.3* 6.1*  ALBUMIN 2.3* 2.3*   No results for  input(s): "LIPASE", "AMYLASE" in the last 168 hours. No results for input(s): "AMMONIA" in the last 168 hours. Coagulation Profile: No results for input(s): "INR", "PROTIME" in the last 168 hours. Cardiac Enzymes: No results for input(s): "CKTOTAL", "CKMB", "CKMBINDEX", "TROPONINI" in the last 168 hours. BNP (last 3 results) No results for input(s): "PROBNP" in the last 8760 hours. HbA1C: No results for input(s): "HGBA1C" in the last 72 hours. CBG: Recent Labs  Lab 07/16/23 0740 07/16/23 0808 07/16/23 1200 07/16/23 2131 07/17/23 1046  GLUCAP 53* 149* 81 125* 77   Lipid Profile: No results for input(s): "CHOL", "HDL", "LDLCALC", "TRIG", "CHOLHDL", "LDLDIRECT" in the last 72 hours. Thyroid Function Tests: No results for input(s): "TSH", "T4TOTAL", "FREET4", "T3FREE", "THYROIDAB" in the last 72 hours. Anemia Panel: No results for input(s): "VITAMINB12", "FOLATE", "FERRITIN", "TIBC", "IRON", "RETICCTPCT" in the last 72 hours. Sepsis Labs: No results for input(s): "PROCALCITON", "LATICACIDVEN" in the last 168 hours.  Recent Results (from the past 240 hour(s))  Urine Culture     Status: None   Collection Time: 07/11/23  7:20 PM   Specimen: Urine, Random  Result Value Ref Range Status   Specimen Description   Final    URINE, RANDOM Performed at [facility], [address], [place], [place] [address]    Special Requests   Final    NONE Reflexed from [id number] Performed at [facility], [address], [place], [place] [address]    Culture   Final    NO GROWTH Performed at [facility], [address], [place], [place] [address]    Report Status 07/13/2023 FINAL  Final  SARS Coronavirus 2 by RT PCR (hospital order, performed in [facility] hospital lab) *cepheid single result test* Anterior Nasal Swab     Status: None   Collection Time: 07/11/23  7:40 PM   Specimen: Anterior Nasal Swab  Result Value Ref Range Status   SARS  Coronavirus 2 by RT PCR NEGATIVE NEGATIVE Final    Comment: (NOTE) SARS-CoV-2 target nucleic acids are NOT DETECTED.  The SARS-CoV-2 RNA is generally detectable in upper and lower respiratory specimens during the acute phase of infection. The lowest concentration of SARS-CoV-2 viral copies this assay can detect is 250 copies / mL. A negative result does not preclude SARS-CoV-2 infection and should not be used as the sole basis for treatment or other patient management decisions.  A negative result may occur with improper specimen collection / handling, submission of specimen other than nasopharyngeal swab, presence of viral mutation(s) within the areas targeted by this assay, and inadequate number of viral copies (<250 copies / mL). A negative result must be combined with clinical observations, patient history, and epidemiological information.  Fact Sheet for Patients:   RoadLapTop.co.za  Fact Sheet for  Healthcare Providers: http://kim-miller.com/  This test is not yet approved or  cleared by the [place] and has been authorized for detection and/or diagnosis of SARS-CoV-2 by FDA under an Emergency Use Authorization (EUA).  This EUA will remain in effect (meaning this test can be used) for the duration of the COVID-19 declaration under Section 564(b)(1) of the Act, 21 U.S.C. section 360bbb-3(b)(1), unless the authorization is terminated or revoked sooner.  Performed at [facility], [address] [place], [place] [address]   Culture, blood (Routine X 2) w Reflex to ID Panel     Status: None   Collection Time: 07/12/23  5:05 AM   Specimen: BLOOD RIGHT ARM  Result Value Ref Range Status   Specimen Description   Final    BLOOD RIGHT ARM Performed at [facility], [address], [place], [place] [address]    Special Requests   Final    BOTTLES DRAWN AEROBIC ONLY Blood Culture results  may not be optimal due to an inadequate volume of blood received in culture bottles Performed at [facility], [address], [place], [place] [address]    Culture   Final    NO GROWTH 5 DAYS Performed at [facility], [address] [place], [place] [address]    Report Status 07/17/2023 FINAL  Final  Culture, blood (Routine X 2) w Reflex to ID Panel     Status: Abnormal   Collection Time: 07/12/23  5:05 AM   Specimen: BLOOD RIGHT HAND  Result Value Ref Range Status   Specimen Description   Final    BLOOD RIGHT HAND Performed at [facility], [address], [place], [place] [address]    Special Requests   Final    BOTTLES DRAWN AEROBIC ONLY Blood Culture results may not be optimal due to an inadequate volume of blood received in culture bottles Performed at [facility], [address] [place], [place] [address]    Culture  Setup Time   Final    GRAM POSITIVE COCCI AEROBIC BOTTLE ONLY CRITICAL RESULT CALLED TO, READ BACK BY AND VERIFIED WITH: PHARMD M. BELL 07/13/23 @  2142 BY AB    Culture (A)  Final    STAPHYLOCOCCUS EPIDERMIDIS THE SIGNIFICANCE OF ISOLATING THIS ORGANISM FROM A SINGLE SET OF BLOOD CULTURES WHEN MULTIPLE SETS ARE DRAWN IS UNCERTAIN. PLEASE NOTIFY THE MICROBIOLOGY DEPARTMENT WITHIN ONE WEEK IF SPECIATION AND SENSITIVITIES ARE REQUIRED. Performed at [facility], [address], [place], [place] [address]    Report Status 07/15/2023 FINAL  Final  Blood Culture ID Panel (Reflexed)     Status: Abnormal   Collection Time: 07/12/23  5:05 AM  Result Value Ref Range Status   Enterococcus faecalis NOT DETECTED NOT DETECTED Final   Enterococcus Faecium NOT DETECTED NOT DETECTED Final   Listeria monocytogenes NOT DETECTED NOT DETECTED Final   Staphylococcus species DETECTED (A) NOT DETECTED Final    Comment: CRITICAL RESULT CALLED TO, READ BACK BY AND VERIFIED WITH: PHARMD M. BELL 07/13/23 @  2142  BY AB    Staphylococcus aureus (BCID) NOT DETECTED NOT DETECTED Final   Staphylococcus epidermidis DETECTED (A) NOT DETECTED Final    Comment: Methicillin (oxacillin) resistant coagulase negative staphylococcus. Possible blood culture contaminant (unless isolated from more than one blood culture draw or clinical case suggests pathogenicity). No antibiotic treatment is indicated for blood  culture contaminants. CRITICAL RESULT CALLED TO, READ BACK  BY AND VERIFIED WITH: PHARMD M. BELL 07/13/23 @  2142 BY AB    Staphylococcus lugdunensis NOT DETECTED NOT DETECTED Final   Streptococcus species NOT DETECTED NOT DETECTED Final   Streptococcus agalactiae NOT DETECTED NOT DETECTED Final   Streptococcus pneumoniae NOT DETECTED NOT DETECTED Final   Streptococcus pyogenes NOT DETECTED NOT DETECTED Final   A.calcoaceticus-baumannii NOT DETECTED NOT DETECTED Final   Bacteroides fragilis NOT DETECTED NOT DETECTED Final   Enterobacterales NOT DETECTED NOT DETECTED Final   Enterobacter cloacae complex NOT DETECTED NOT DETECTED Final   Escherichia coli NOT DETECTED NOT DETECTED Final   Klebsiella aerogenes NOT DETECTED NOT DETECTED Final   Klebsiella oxytoca NOT DETECTED NOT DETECTED Final   Klebsiella pneumoniae NOT DETECTED NOT DETECTED Final   Proteus species NOT DETECTED NOT DETECTED Final   Salmonella species NOT DETECTED NOT DETECTED Final   Serratia marcescens NOT DETECTED NOT DETECTED Final   Haemophilus influenzae NOT DETECTED NOT DETECTED Final   Neisseria meningitidis NOT DETECTED NOT DETECTED Final   Pseudomonas aeruginosa NOT DETECTED NOT DETECTED Final   Stenotrophomonas maltophilia NOT DETECTED NOT DETECTED Final   Candida albicans NOT DETECTED NOT DETECTED Final   Candida auris NOT DETECTED NOT DETECTED Final   Candida glabrata NOT DETECTED NOT DETECTED Final   Candida krusei NOT DETECTED NOT DETECTED Final   Candida parapsilosis NOT DETECTED NOT DETECTED Final   Candida tropicalis  NOT DETECTED NOT DETECTED Final   Cryptococcus neoformans/gattii NOT DETECTED NOT DETECTED Final   Methicillin resistance mecA/C DETECTED (A) NOT DETECTED Final    Comment: CRITICAL RESULT CALLED TO, READ BACK BY AND VERIFIED WITH: PHARMD M. BELL 07/13/23 @  2142 BY AB Performed at [facility], [address] [place], [place] [address]          Radiology Studies: No results found.      Scheduled Meds:  brimonidine  1 drop Both Eyes Daily   calcitonin (salmon)  1 spray Alternating Nares Daily   Chlorhexidine Gluconate Cloth  6 each Topical Daily   dexamethasone (DECADRON) injection  8 mg Intravenous Q24H   enoxaparin (LOVENOX) injection  40 mg Subcutaneous Q24H   leptospermum manuka honey  1 Application Topical Daily   levETIRAcetam  250 mg Oral BID   memantine  10 mg Oral BID   mouth rinse  15 mL Mouth Rinse 4 times per day   Continuous Infusions:  sodium chloride 100 mL/hr at 07/17/23 0713     LOS: 6 days    Time spent: 51 minutes spent on chart review, discussion with nursing staff, consultants, updating family and interview/physical exam; more than 50% of that time was spent in counseling and/or coordination of care.    Alvira Philips [place], DO [facility] Available via Epic secure chat 7am-7pm After these hours, please refer to coverage provider listed on [email or web address] 07/17/2023, 6:16 PM

## 2023-07-17 NOTE — Progress Notes (Signed)
Palliative Care Progress Note  Ms. Norway is mostly unchanged but she does answer questions when asked directly and is communicating better. She denies pain, but is stiff and sore when turned and repositioned in the bed. He family is gathered at bedside and are devoted to her care.They know she has no treatment options and comfort and QOL are the goal. I provided support and education on end of life care. I recommended hospice at home and they are in agreement. They are established with [facility] Palliative already-will ask TOC to make referral for hospice services at home.  She has palliative prophylaxis order and comfort meds if needed.  Anderson Malta, DO Palliative Medicine

## 2023-07-18 ENCOUNTER — Other Ambulatory Visit (HOSPITAL_COMMUNITY): Payer: Self-pay

## 2023-07-18 ENCOUNTER — Other Ambulatory Visit: Payer: Self-pay

## 2023-07-18 ENCOUNTER — Encounter: Payer: Self-pay | Admitting: Hematology and Oncology

## 2023-07-18 ENCOUNTER — Encounter (HOSPITAL_COMMUNITY): Payer: Self-pay

## 2023-07-18 ENCOUNTER — Ambulatory Visit (HOSPITAL_COMMUNITY): Payer: Medicare PPO

## 2023-07-18 DIAGNOSIS — G9341 Metabolic encephalopathy: Secondary | ICD-10-CM | POA: Diagnosis not present

## 2023-07-18 MED ORDER — BISACODYL 10 MG RE SUPP
10.0000 mg | Freq: Every day | RECTAL | 0 refills | Status: DC | PRN
  Filled 2023-07-18 – 2023-07-19 (×2): qty 12, 12d supply, fill #0

## 2023-07-18 MED ORDER — MUSCLE RUB 10-15 % EX CREA
TOPICAL_CREAM | CUTANEOUS | Status: DC | PRN
  Filled 2023-07-18: qty 85

## 2023-07-18 MED ORDER — FUROSEMIDE 20 MG PO TABS
20.0000 mg | ORAL_TABLET | Freq: Every day | ORAL | 0 refills | Status: DC
  Filled 2023-07-18: qty 90, 90d supply, fill #0

## 2023-07-18 MED ORDER — DEXAMETHASONE 4 MG PO TABS
4.0000 mg | ORAL_TABLET | Freq: Every day | ORAL | 0 refills | Status: DC
  Filled 2023-07-18: qty 90, 90d supply, fill #0

## 2023-07-18 MED ORDER — CALCITONIN (SALMON) 200 UNIT/ACT NA SOLN
1.0000 | NASAL | 0 refills | Status: DC
  Filled 2023-07-18 – 2023-07-19 (×2): qty 3.7, 82d supply, fill #0

## 2023-07-18 MED ORDER — LEVETIRACETAM 250 MG PO TABS
250.0000 mg | ORAL_TABLET | Freq: Two times a day (BID) | ORAL | 0 refills | Status: DC
  Filled 2023-07-18: qty 180, 90d supply, fill #0

## 2023-07-18 MED ORDER — FUROSEMIDE 20 MG PO TABS
20.0000 mg | ORAL_TABLET | Freq: Every day | ORAL | Status: DC
  Administered 2023-07-18: 20 mg via ORAL
  Filled 2023-07-18: qty 1

## 2023-07-18 MED ORDER — CIPROFLOXACIN HCL 500 MG PO TABS
500.0000 mg | ORAL_TABLET | Freq: Two times a day (BID) | ORAL | 0 refills | Status: AC | PRN
  Filled 2023-07-18: qty 10, 5d supply, fill #0

## 2023-07-18 MED ORDER — OXYCODONE HCL 5 MG/5ML PO SOLN
2.0000 mg | ORAL | 0 refills | Status: DC | PRN
  Filled 2023-07-18: qty 120, 5d supply, fill #0
  Filled 2023-07-18: qty 353, 15d supply, fill #0

## 2023-07-18 NOTE — Plan of Care (Signed)
  Problem: Safety: Goal: Ability to remain free from injury will improve Outcome: Progressing   Problem: Skin Integrity: Goal: Risk for impaired skin integrity will decrease Outcome: Progressing   Problem: Pain Managment: Goal: General experience of comfort will improve Outcome: Progressing   Problem: Coping: Goal: Level of anxiety will decrease Outcome: Progressing   

## 2023-07-18 NOTE — TOC Progression Note (Signed)
Transition of Care [facility]) - Progression Note    Patient Details  Name: Brylea Moscoso MRN: [id number] Date of Birth: 1935/01/16  Transition of Care [facility]) CM/SW Contact  Geni Bers, RN Phone Number: 07/18/2023, 2:07 PM  Clinical Narrative:    Sharin Mons was called ETA 1-2 hours. Daughter Massie Bougie was called and made aware.    Expected Discharge Plan: Home w Home Health Services Barriers to Discharge: Continued Medical Work up  Expected Discharge Plan and Services   Discharge Planning Services: CM Consult   Living arrangements for the past 2 months: Single Family Home Expected Discharge Date: 07/18/23                                     Social Determinants of Health (SDOH) Interventions SDOH Screenings   Food Insecurity: No Food Insecurity (07/12/2023)  Housing: Low Risk  (07/12/2023)  Transportation Needs: No Transportation Needs (07/12/2023)  Utilities: Not At Risk (07/12/2023)  Tobacco Use: Low Risk  (07/11/2023)    Readmission Risk Interventions    07/12/2023   11:00 AM 07/02/2023   12:00 PM 07/01/2023   12:14 PM  Readmission Risk Prevention Plan  Transportation Screening Complete Complete   Medication Review Oceanographer) Complete Referral to Pharmacy   PCP or Specialist appointment within 3-5 days of discharge Complete Complete Not Complete  PCP/Specialist Appt Not Complete comments  patient does have PCP and will follow up appt schediled with specialists  HRI or Home Care Consult Complete Complete   HRI or Home Care Consult Pt Refusal Comments  list provided for daughter   SW Recovery Care/Counseling Consult Complete Complete Not Complete  Palliative Care Screening Complete Complete   Skilled Nursing Facility Not Applicable Patient Refused Not Complete

## 2023-07-18 NOTE — Consult Note (Signed)
   [facility] CM Inpatient Consult   07/18/2023  Amanda Green [birth year] [id number]  Reviewed for  extreme high risk for less than 30 days readmission and 6 admissions noted in the past 6 months  [facility] Liaison remote coverage review for patient admitted to [facility]    [facility] [[facility]]  Accountable Care Organization [ACO] Patient: [employer]  Primary Care Provider: Ollen Bowl, MD noted with Deboraha Sprang at [facility]   Chart reviewed and reveals the patient is currently followed by Hospice for post plan of care.  Currently, with [facility]  Plan: Patient will have full case management services through Hospice and needs will be met at the hospice level of care. No [facility] Care Management is planned for transitional needs.   Will sign off at transition from hospital. Follow up with [facility] team as appropriate.  For questions,   Charlesetta Shanks, RN BSN CCM [facility]  [phone] business mobile phone Toll free office [phone]  *Concierge Line  [phone] Fax number: [phone] [email or web address]

## 2023-07-18 NOTE — Progress Notes (Signed)
   This pt has been re-referred to hospice for hospice care at home. I have reviewed chart and discussed with our MD who does feel pt is appropriate for hospice care at home. I have contacted the daughter Gavin Pound and she is in agreement with hospice comfort care approach at home. The daughter feels that they have all the equipment needed in home. She will go home by ambulance d/c today and will need ambulance set up. TOC has taken care of arranging this for d/c home today. We will be able to accommodate an admission visit today once they get home.   Thank you Norm Parcel RN [phone]

## 2023-07-18 NOTE — Discharge Summary (Signed)
Physician Discharge Summary  Darricka Mikus YQM:[id number] DOB: [birth year] DOA: 07/11/2023  PCP: Ollen Bowl, MD  Admit date: 07/11/2023 Discharge date: 07/18/2023  Admitted From: Home Disposition: Home with hospice  Recommendations for Outpatient Follow-up:  Follow up with hospice provider on discharge  Discharge Condition: Guarded CODE STATUS: DNR Diet recommendation: Comfort feeds as tolerates  History of present illness:  Shanquilla Cadman is a 88 y.o. female with past medical history significant for chronic diastolic congestive heart failure, dementia, paroxysmal atrial fibrillation, cardiac amyloidosis, breast cancer with brain and liver metastasis, history of CVA, urinary retention with chronic indwelling Foley catheter who presented to [facility] ED on 7/25 with lethargy, poor oral intake, and progressive confusion over the previous few days.  Patient currently lives at home with assistance of her daughters.  Daughter reports patient had stopped eating and EMS was called and transported to the ED for further evaluation.  Patient with 2 admissions over this last month from 7/5 - 7/7 and 7/13 - 7/17 for similar symptoms.   In the ED, patient was weak appearing and lethargic.  WBC 9.1, hemoglobin 10.8, platelet count 419.  Sodium 133, potassium 3.2, chloride 96, CO2 22, glucose 58, BUN 32, creatinine 0.71.  Calcium 10.6 urinalysis unrevealing.  COVID-19 PCR negative.  CT head without contrast with no acute intracranial process, stable left occipital lobe lesion with surrounding vasogenic edema, stable periventricular white matter hypodensity likely chronic small vessel ischemic changes, stable small mass right lateral ventricle.  [facility] was consulted for admission for further evaluation and management of adult failure to thrive, altered mental status, and hypercalcemia likely secondary to malignancy.  Hospital course:  Acute metabolic encephalopathy Etiology likely  multifactorial in the setting of adult failure to thrive, hypoglycemia, hypercalcemia of malignancy.  Patient continues with poor oral intake and was seen by palliative care with recommendations of transitioning to comfort care/hospice. Continue supportive care and discharge on home hospice.   Urinary tract infection, ruled out Chronic Foley catheter Urine culture with no growth, did complete course of IV ceftriaxone while inpatient.  Patient given prescription for Cipro to use as needed at family's request for concern of recurrent UTI.   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  Follows with medical oncology, Dr. Pamelia Hoit palliative care outpatient.  Underwent bilateral mastectomies by general surgery and completed radiation therapy which she did not tolerate well.  Received Zometa on 07/12/2023.  Calcium improved to 8.3.  Continue Keppra 250 mg p.o. twice daily for seizure prophylaxis, Decadron 4 mg p.o. every 24 hours, Calcitonin 1 spray.  Discharging with home hospice as above   Dementia Namenda 10 mg p.o. twice daily   Type 2 diabetes mellitus Hypoglycemia Hemoglobin A1c 6.21 January 2023, well-controlled with diet.  Patient with hyperglycemia with glucose 58 on admission likely secondary to poor oral intake.  Supported with IV fluids/dextrose infusion.  Remains at risk given poor oral intake but now transitioning to hospice care.   Paroxysmal atrial fibrillation With borderline blood pressure holding metoprolol and amiodarone, rate controlled.  Not on anticoagulation due to brain metastasis and increased risk of bleeding.   Adult failure to thrive Likely secondary to progressive malignancy with metastasis.  Remains with poor oral intake with multiple hospitalizations in the last month.  Seen by palliative care and plan to  transition to home hospice.  Stage III right heel pressure injury, POA Stage II right sacral pressure injury, POA Pressure Injury 06/22/23 Heel Right Stage 3 -  Full thickness tissue loss. Subcutaneous fat may be visible but bone, tendon or muscle are NOT exposed. (Active)  06/22/23 0925  Location: Heel  Location Orientation: Right  Staging: Stage 3 -  Full thickness tissue loss. Subcutaneous fat may be visible but bone, tendon or muscle are NOT exposed.  Wound Description (Comments):   Present on Admission: Yes     Pressure Injury 06/29/23 Sacrum Right;Left;Medial Stage 2 -  Partial thickness loss of dermis presenting as a shallow open injury with a red, pink wound bed without slough. (Active)  06/29/23 2340  Location: Sacrum  Location Orientation: Right;Left;Medial  Staging: Stage 2 -  Partial thickness loss of dermis presenting as a shallow open injury with a red, pink wound bed without slough.  Wound Description (Comments):   Present on Admission: Yes  -- Evaluated by wound RN, continue offloading  Discharge Diagnoses:  Principal Problem:   Acute metabolic encephalopathy Active Problems:   Hypercalcemia of malignancy    Discharge Instructions  Discharge Instructions     Diet - low sodium heart healthy   Complete by: As directed    Discharge wound care:   Complete by: As directed    Wound care to right heel stage 3:  Wash foot with soap and water daily, rinse and dry. Apply Medihoney as directed, top with dry gauze and secure with a few turns of Kerlix roll gauze/paper tape. Place foot (feet) into Prevalon boots.   Increase activity slowly   Complete by: As directed       Allergies as of 07/18/2023       Reactions   Neurontin [gabapentin] Other (See Comments)   Drowsiness        Medication List     STOP taking these medications    amiodarone 100 MG tablet Commonly known as: Pacerone   hydrochlorothiazide 12.5 MG tablet Commonly known as: HYDRODIURIL    losartan 25 MG tablet Commonly known as: COZAAR   MAGNESIUM OXIDE PO   metoprolol tartrate 50 MG tablet Commonly known as: LOPRESSOR   Multivitamin Women 50+ Tabs   Pacerone 100 MG tablet Generic drug: amiodarone   spironolactone 25 MG tablet Commonly known as: ALDACTONE   VITAMIN B-12 PO   VITAMIN C PO   Vyndamax 61 MG Caps Generic drug: Tafamidis       TAKE these medications    acetaminophen 650 MG CR tablet Commonly known as: TYLENOL Take 650-1,300 mg by mouth 2 (two) times daily as needed for pain.   albuterol (2.5 MG/3ML) 0.083% nebulizer solution Commonly known as: PROVENTIL Take 2.5 mg by nebulization every 4 (four) hours.   bisacodyl 10 MG suppository Commonly known as: DULCOLAX Place 1 suppository (10 mg total) rectally daily as needed for moderate constipation.   brimonidine 0.2 % ophthalmic solution Commonly known as: ALPHAGAN Place 1 drop into both eyes daily.   calcitonin (salmon) 200 UNIT/ACT nasal spray Commonly known as: MIACALCIN/FORTICAL Place 1 spray into alternate nostrils every other day. Start taking on: July 19, 2023   ciprofloxacin 500 MG tablet Commonly known as: Cipro Take 1 tablet (500 mg total) by mouth 2 (two) times daily as needed for up to 5 days (if concern for UTI).   dexamethasone 4 MG tablet Commonly known as: DECADRON Take 1 tablet (4 mg total) by mouth daily.   diclofenac Sodium 1 % Gel Commonly  known as: VOLTAREN Apply 2 g topically 4 (four) times daily as needed (joint pain- affected areas).   dorzolamide 2 % ophthalmic solution Commonly known as: TRUSOPT Place 1 drop into both eyes 2 (two) times daily.   furosemide 20 MG tablet Commonly known as: LASIX Take 1 tablet (20 mg total) by mouth daily.   hydrocerin Crea Apply 1 Application topically daily. Apply to left leg and heel once daily, do not apply between digits What changed: when to take this   leptospermum manuka honey Pste paste Apply 1  Application topically daily. Apply to right heel Unstageable PI and sacral stage 2 PI after cleansing Apply thin layer (3 mm) to wound. What changed: when to take this   levETIRAcetam 250 MG tablet Commonly known as: KEPPRA Take 1 tablet (250 mg total) by mouth 2 (two) times daily.   memantine 10 MG tablet Commonly known as: Namenda Take 1 tablet (10 mg total) by mouth 2 (two) times daily. What changed: when to take this   oxyCODONE 5 MG/5ML solution Commonly known as: ROXICODONE Take 2 mLs (2 mg total) by mouth every 2 (two) hours as needed for moderate pain (dyspnea or distress).   TURMERIC CURCUMIN PO Take 1 capsule by mouth daily.               Discharge Care Instructions  (From admission, onward)           Start     Ordered   07/18/23 0000  Discharge wound care:       Comments: Wound care to right heel stage 3:  Wash foot with soap and water daily, rinse and dry. Apply Medihoney as directed, top with dry gauze and secure with a few turns of Kerlix roll gauze/paper tape. Place foot (feet) into Prevalon boots.   07/18/23 1333            Follow-up Information     Care, [facility] Follow up.   Specialty: Home Health Services Why: [facility] physical/occupational therapy;aide Contact information: [address] [place] [address] [phone]         [facility]. Call.   Specialty: PALLIATIVE CARE Contact information: [address] [facility] [address] [phone]               Allergies  Allergen Reactions   Neurontin [Gabapentin] Other (See Comments)    Drowsiness    Consultations: Palliative care    Procedures/Studies: CT HEAD WO CONTRAST ( )  Result Date: 07/11/2023 CLINICAL DATA:  Mental status change.  History of brain metastases. EXAM: CT HEAD WITHOUT CONTRAST TECHNIQUE: Contiguous axial images were obtained from the base of the skull through the vertex without intravenous  contrast. RADIATION DOSE REDUCTION: This exam was performed according to the departmental dose-optimization program which includes automated exposure control, adjustment of the mA and/or kV according to patient size and/or use of iterative reconstruction technique. COMPARISON:  MRI brain 06/22/2023.  CT head 06/21/2023. FINDINGS: Brain: Heterogeneous lesion in the left occipital lobe measures proximally 2.0 x 1.5 cm similar to the prior study. There is surrounding vasogenic edema, also unchanged. Mild periventricular white matter hypodensity persists and appears unchanged. No new focal areas of edema identified. No acute intracranial hemorrhage, extra-axial fluid collection or midline shift. No hydrocephalus. Oval density in the right lateral ventricle measuring 6 x 4 mm appears unchanged. Vascular: Atherosclerotic calcifications are present within the cavernous internal carotid arteries. Skull: Normal. Negative for fracture or focal  lesion. Sinuses/Orbits: No acute finding. Other: None. IMPRESSION: 1. No acute intracranial process. 2. Stable left occipital lobe lesion with surrounding vasogenic edema. 3. Stable periventricular white matter hypodensity, likely chronic small vessel ischemic changes. 4. Stable small mass in the right lateral ventricle. Electronically Signed   By: Darliss Cheney M.D.   On: 07/11/2023 20:16   DG Chest Port 1 View  Result Date: 07/11/2023 CLINICAL DATA:  Cough. EXAM: PORTABLE CHEST 1 VIEW COMPARISON:  Chest radiograph dated 06/29/2023. FINDINGS: Small bilateral pleural effusions, left greater right relatively similar to or minimally increased since the prior radiograph. There are bibasilar atelectasis. Pneumonia is not excluded. No pneumothorax. Stable cardiac silhouette. Atherosclerotic calcification of the aorta. Degenerative changes of the spine. No acute osseous pathology. IMPRESSION: No significant interval change since the prior radiograph. Electronically Signed   By: Elgie Collard M.D.   On: 07/11/2023 20:01   DG Chest Port 1 View  Result Date: 06/29/2023 CLINICAL DATA:  Fever, cough, hypoxia, weakness, fatigue. Metastatic breast cancer. EXAM: PORTABLE CHEST 1 VIEW COMPARISON:  06/21/2023 FINDINGS: Retrocardiac airspace opacity is mildly increased and likely represents combination of known pleural effusion and atelectasis although a component of pneumonia cannot be excluded. Atherosclerotic calcification of the aortic arch. Right axillary lymph node clips. Bony demineralization. The right lung appears clear. IMPRESSION: 1. Retrocardiac airspace opacity is mildly increased and likely represents combination of known pleural effusion and atelectasis although a component of pneumonia cannot be excluded. 2.  Aortic Atherosclerosis (ICD10-I70.0). Electronically Signed   By: Gaylyn Rong M.D.   On: 06/29/2023 20:39   MR BRAIN W WO CONTRAST  Result Date: 06/22/2023 CLINICAL DATA:  History of breast cancer with brain metastases, altered mental status, lethargy. EXAM: MRI HEAD WITHOUT AND WITH CONTRAST TECHNIQUE: Multiplanar, multiecho pulse sequences of the brain and surrounding structures were obtained without and with intravenous contrast. CONTRAST:  7mL GADAVIST GADOBUTROL 1 MMOL/ML IV SOLN COMPARISON:  CT head 1 day prior, brain MRI 02/14/2023 FINDINGS: Brain: The 2.0 cm AP x 1.9 cm TV x 1.9 cm cc peripherally enhancing metastatic lesion in the left occipital lobe is decreased in size, previously measured 2.9 cm x 2.9 cm x 3.0 cm. Surrounding edema persists but has improved, with decreased mass effect on the surrounding structures and re-expansion of the occipital horn. A small amount of SWI signal dropout within this lesion is consistent with intralesional hemorrhage. A 0.4 cm enhancing lesion in the right occipital cortex is new since the prior study (10-30). A 0.4 cm lesion in the left temporal cortex is new (10-23). A 0.3 cm lesion in the left middle frontal gyrus is new  (10-28). A punctate focus of enhancement in the left occipital cortex inferior to the dominant lesion is unchanged (10-23). There is no evidence of acute intracranial hemorrhage, extra-axial fluid collection, or acute infarct. Background parenchymal volume is normal. The ventricles are otherwise stable in size. Background chronic small-vessel ischemic changes in the supratentorial white matter and pons are stable. A small focus of SWI signal dropout in the right occipital lobe is unchanged, nonspecific. A probable small focus of nodular gray matter heterotopia along the body of the right lateral ventricle is unchanged. The pituitary and suprasellar region are normal. There is no midline shift. Vascular: Normal flow voids. Skull and upper cervical spine: Normal marrow signal. Sinuses/Orbits: Paranasal sinuses are clear. Bilateral lens implants are in place. The globes and orbits are otherwise unremarkable. Other: Is a left mastoid effusion, new/increased since the prior study.  The imaged nasopharynx is unremarkable. IMPRESSION: Since the MRI from 02/14/2023, the largest metastatic lesion in the left occipital lobe has decreased in size with decreased surrounding edema, but there are three new subcentimeter lesions in the brain described above. Findings consistent with mixed response. Electronically Signed   By: Lesia Hausen M.D.   On: 06/22/2023 18:24   CT Head Wo Contrast  Result Date: 06/21/2023 CLINICAL DATA:  Neuro deficit, acute, stroke suspected. Weakness and lethargy. History of stroke and breast cancer. EXAM: CT HEAD WITHOUT CONTRAST TECHNIQUE: Contiguous axial images were obtained from the base of the skull through the vertex without intravenous contrast. RADIATION DOSE REDUCTION: This exam was performed according to the departmental dose-optimization program which includes automated exposure control, adjustment of the mA and/or kV according to patient size and/or use of iterative reconstruction  technique. COMPARISON:  Head CT 03/30/2023 and MRI 02/14/2023 FINDINGS: Brain: There is mild residual left parieto-occipital vasogenic edema, decreased from the prior CT. There is a small amount of calcification associated with the known underlying mass. No new brain edema, acute infarct, acute hemorrhage, midline shift, or extra-axial fluid collection is identified. Mild cerebral atrophy is within normal limits for age. Hypodensities elsewhere in the cerebral white matter bilaterally are unchanged and nonspecific but compatible with mild-to-moderate chronic small vessel ischemic disease. A 1 cm focus of subependymal gray matter heterotopia is again noted along the posterior body of the right lateral ventricle. Vascular: Calcified atherosclerosis at the skull base. No hyperdense vessel. Skull: No acute fracture or suspicious osseous lesion. Sinuses/Orbits: Paranasal sinuses and mastoid air cells are clear. Bilateral cataract extraction Other: None. IMPRESSION: 1. No evidence of acute intracranial abnormality. 2. Known left parieto-occipital mass with decreased, mild residual edema. 3. Mild-to-moderate chronic small vessel ischemic disease. Electronically Signed   By: Sebastian Ache M.D.   On: 06/21/2023 15:07   DG Foot 2 Views Right  Result Date: 06/21/2023 CLINICAL DATA:  Weakness. EXAM: RIGHT FOOT - 2 VIEW COMPARISON:  None Available. FINDINGS: No fracture. Significant narrowing with marginal osteophytes at the first metatarsophalangeal joint suspected to be osteoarthritis. Remaining joints normally spaced and aligned. Moderate-sized plantar calcaneal spur. There is soft tissue swelling most evident over the dorsal forefoot. No soft tissue air. IMPRESSION: 1. No fracture or acute skeletal finding. 2. Nonspecific soft tissue swelling. 3. 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 Plantar calcaneal spur. Electronically Signed   By: Amie Portland M.D.   On: 06/21/2023 13:26   DG Chest  Portable 1 View  Result Date: 06/21/2023 CLINICAL DATA:  Weakness. EXAM: PORTABLE CHEST 1 VIEW COMPARISON:  04/01/2023 and older exams.  CT, 06/18/2023. FINDINGS: Left lung base opacity obscures the left hemidiaphragm consistent with a small effusion and associated atelectasis or infection. Atelectasis favored and appearance is without significant change from the recent chest CT. Remainder of the lungs is clear. No right pleural effusion. No pneumothorax. Heart top-normal in size.  No mediastinal or hilar masses. Skeletal structures are grossly intact. Stable changes from right breast surgery. IMPRESSION: 1. Left lung base opacity consistent with a combination of pleural fluid and atelectasis. Pneumonia should be considered in the proper clinical setting. Overall, appearance is stable compared to the recent prior CT from 06/18/2023. Electronically Signed   By: Amie Portland M.D.   On: 06/21/2023 13:23     Subjective: Patient seen and examined at bedside, lying in bed.  Daughter present and RN present at bedside.  Discharging home with hospice today.  Daughter requesting antibiotic  if she has possible recurrence of UTI and requesting restart of her home Lasix.  No other questions or concerns at this time.  Patient denies headache, no shortness of breath, no chest pain.  No acute events overnight per nursing staff.  Discharge Exam: Vitals:   07/18/23 0939 07/18/23 1240  BP: 125/72 109/65  Pulse: 92 87  Resp: 20   Temp: 97.8 F (36.6 C) 98 F (36.7 C)  SpO2: 100% 100%   Vitals:   07/17/23 2144 07/18/23 0527 07/18/23 0939 07/18/23 1240  BP: (!) 148/73 136/83 125/72 109/65  Pulse: 97 89 92 87  Resp: 18 19 20    Temp: 97.7 F (36.5 C) 97.7 F (36.5 C) 97.8 F (36.6 C) 98 F (36.7 C)  TempSrc: Oral Oral Oral Oral  SpO2: 100% 99% 100% 100%  Weight:      Height:        Physical Exam: GEN: NAD, chronically ill in appearance HEENT: NCAT, PERRL, EOMI, sclera clear, dry mucous membranes PULM:  CTAB w/o wheezes/crackles, normal respiratory effort, on room air CV: RRR w/o M/G/R GI: abd soft, NTND, NABS, no R/G/M MSK: no peripheral edema    The results of significant diagnostics from this hospitalization (including imaging, microbiology, ancillary and laboratory) are listed below for reference.     Microbiology: Recent Results (from the past 240 hour(s))  Urine Culture     Status: None   Collection Time: 07/11/23  7:20 PM   Specimen: Urine, Random  Result Value Ref Range Status   Specimen Description   Final    URINE, RANDOM Performed at [facility], [address] [place], [place] [address]    Special Requests   Final    NONE Reflexed from [id number] Performed at [facility], [address] [place], [place] [address]    Culture   Final    NO GROWTH Performed at [facility], [address] [place], [place] [address]    Report Status 07/13/2023 FINAL  Final  SARS Coronavirus 2 by RT PCR (hospital order, performed in [facility] hospital lab) *cepheid single result test* Anterior Nasal Swab     Status: None   Collection Time: 07/11/23  7:40 PM   Specimen: Anterior Nasal Swab  Result Value Ref Range Status   SARS Coronavirus 2 by RT PCR NEGATIVE NEGATIVE Final    Comment: (NOTE) SARS-CoV-2 target nucleic acids are NOT DETECTED.  The SARS-CoV-2 RNA is generally detectable in upper and lower respiratory specimens during the acute phase of infection. The lowest concentration of SARS-CoV-2 viral copies this assay can detect is 250 copies / mL. A negative result does not preclude SARS-CoV-2 infection and should not be used as the sole basis for treatment or other patient management decisions.  A negative result may occur with improper specimen collection / handling, submission of specimen other than nasopharyngeal swab, presence of viral mutation(s) within the areas targeted by this assay, and inadequate number of viral  copies (<250 copies / mL). A negative result must be combined with clinical observations, patient history, and epidemiological information.  Fact Sheet for Patients:   RoadLapTop.co.za  Fact Sheet for Healthcare Providers: http://kim-miller.com/  This test is not yet approved or  cleared by the [place] FDA and has been authorized for detection and/or diagnosis of SARS-CoV-2 by FDA under an Emergency Use Authorization (EUA).  This EUA will remain in effect (meaning this test can be used) for the duration of the COVID-19 declaration  under Section 564(b)(1) of the Act, 21 U.S.C. section 360bbb-3(b)(1), unless the authorization is terminated or revoked sooner.  Performed at [facility], [address] [place], [place] [address]   Culture, blood (Routine X 2) w Reflex to ID Panel     Status: None   Collection Time: 07/12/23  5:05 AM   Specimen: BLOOD RIGHT ARM  Result Value Ref Range Status   Specimen Description   Final    BLOOD RIGHT ARM Performed at [facility], [address], [place], [place] [address]    Special Requests   Final    BOTTLES DRAWN AEROBIC ONLY Blood Culture results may not be optimal due to an inadequate volume of blood received in culture bottles Performed at [facility], [address] [place], [place] [address]    Culture   Final    NO GROWTH 5 DAYS Performed at [facility], [address] [place], [place] [address]    Report Status 07/17/2023 FINAL  Final  Culture, blood (Routine X 2) w Reflex to ID Panel     Status: Abnormal   Collection Time: 07/12/23  5:05 AM   Specimen: BLOOD RIGHT HAND  Result Value Ref Range Status   Specimen Description   Final    BLOOD RIGHT HAND Performed at [facility], [address], [place], [place] [address]    Special Requests   Final    BOTTLES DRAWN AEROBIC ONLY Blood Culture  results may not be optimal due to an inadequate volume of blood received in culture bottles Performed at [facility], [address], [place], [place] [address]    Culture  Setup Time   Final    GRAM POSITIVE COCCI AEROBIC BOTTLE ONLY CRITICAL RESULT CALLED TO, READ BACK BY AND VERIFIED WITH: PHARMD M. BELL 07/13/23 @  2142 BY AB    Culture (A)  Final    STAPHYLOCOCCUS EPIDERMIDIS THE SIGNIFICANCE OF ISOLATING THIS ORGANISM FROM A SINGLE SET OF BLOOD CULTURES WHEN MULTIPLE SETS ARE DRAWN IS UNCERTAIN. PLEASE NOTIFY THE MICROBIOLOGY DEPARTMENT WITHIN ONE WEEK IF SPECIATION AND SENSITIVITIES ARE REQUIRED. Performed at [facility], [address] [place], [place] [address]    Report Status 07/15/2023 FINAL  Final  Blood Culture ID Panel (Reflexed)     Status: Abnormal   Collection Time: 07/12/23  5:05 AM  Result Value Ref Range Status   Enterococcus faecalis NOT DETECTED NOT DETECTED Final   Enterococcus Faecium NOT DETECTED NOT DETECTED Final   Listeria monocytogenes NOT DETECTED NOT DETECTED Final   Staphylococcus species DETECTED (A) NOT DETECTED Final    Comment: CRITICAL RESULT CALLED TO, READ BACK BY AND VERIFIED WITH: PHARMD M. BELL 07/13/23 @  2142 BY AB    Staphylococcus aureus (BCID) NOT DETECTED NOT DETECTED Final   Staphylococcus epidermidis DETECTED (A) NOT DETECTED Final    Comment: Methicillin (oxacillin) resistant coagulase negative staphylococcus. Possible blood culture contaminant (unless isolated from more than one blood culture draw or clinical case suggests pathogenicity). No antibiotic treatment is indicated for blood  culture contaminants. CRITICAL RESULT CALLED TO, READ BACK BY AND VERIFIED WITH: PHARMD M. BELL 07/13/23 @  2142 BY AB    Staphylococcus lugdunensis NOT DETECTED NOT DETECTED Final   Streptococcus species NOT DETECTED NOT DETECTED Final   Streptococcus agalactiae NOT DETECTED NOT DETECTED Final   Streptococcus pneumoniae  NOT DETECTED NOT DETECTED Final   Streptococcus pyogenes NOT DETECTED  NOT DETECTED Final   A.calcoaceticus-baumannii NOT DETECTED NOT DETECTED Final   Bacteroides fragilis NOT DETECTED NOT DETECTED Final   Enterobacterales NOT DETECTED NOT DETECTED Final   Enterobacter cloacae complex NOT DETECTED NOT DETECTED Final   Escherichia coli NOT DETECTED NOT DETECTED Final   Klebsiella aerogenes NOT DETECTED NOT DETECTED Final   Klebsiella oxytoca NOT DETECTED NOT DETECTED Final   Klebsiella pneumoniae NOT DETECTED NOT DETECTED Final   Proteus species NOT DETECTED NOT DETECTED Final   Salmonella species NOT DETECTED NOT DETECTED Final   Serratia marcescens NOT DETECTED NOT DETECTED Final   Haemophilus influenzae NOT DETECTED NOT DETECTED Final   Neisseria meningitidis NOT DETECTED NOT DETECTED Final   Pseudomonas aeruginosa NOT DETECTED NOT DETECTED Final   Stenotrophomonas maltophilia NOT DETECTED NOT DETECTED Final   Candida albicans NOT DETECTED NOT DETECTED Final   Candida auris NOT DETECTED NOT DETECTED Final   Candida glabrata NOT DETECTED NOT DETECTED Final   Candida krusei NOT DETECTED NOT DETECTED Final   Candida parapsilosis NOT DETECTED NOT DETECTED Final   Candida tropicalis NOT DETECTED NOT DETECTED Final   Cryptococcus neoformans/gattii NOT DETECTED NOT DETECTED Final   Methicillin resistance mecA/C DETECTED (A) NOT DETECTED Final    Comment: CRITICAL RESULT CALLED TO, READ BACK BY AND VERIFIED WITH: PHARMD M. BELL 07/13/23 @  2142 BY AB Performed at [facility], [address], [place], [place] [address]      Labs: BNP (last 3 results) Recent Labs    04/01/23 0553 04/02/23 0353 06/21/23 2200  BNP 271.6* 424.5* 267.3*   Basic Metabolic Panel: Recent Labs  Lab 07/12/23 0505 07/13/23 0550 07/14/23 0546 07/15/23 0501 07/16/23 0501  NA 134* 140 139 141 141  K 3.3* 3.7 3.6 4.1 3.6  CL 99 106 106 112* 114*  CO2 21* 22 22 20* 16*  GLUCOSE 130* 144* 96 67* 63*   BUN 30* 36* 44* 42* 40*  CREATININE 0.65 0.82 1.20* 1.06* 0.94  CALCIUM 10.4* 10.0 9.5 8.6* 8.3*  MG 2.0  --   --   --   --   PHOS 3.1  --   --   --   --    Liver Function Tests: Recent Labs  Lab 07/11/23 1856 07/12/23 0505  AST 163* 162*  ALT 48* 48*  ALKPHOS 571* 544*  BILITOT 1.7* 1.5*  PROT 6.3* 6.1*  ALBUMIN 2.3* 2.3*   No results for input(s): "LIPASE", "AMYLASE" in the last 168 hours. No results for input(s): "AMMONIA" in the last 168 hours. CBC: Recent Labs  Lab 07/11/23 1856 07/12/23 0505 07/13/23 0550 07/14/23 0546 07/15/23 0501 07/16/23 0501  WBC 9.1 10.6* 17.0* 14.0* 11.7* 11.9*  NEUTROABS 7.1  --  14.0* 12.0* 10.1* 9.9*  HGB 10.8* 10.3* 8.8* 8.9* 8.5* 9.9*  HCT 32.8* 31.5* 26.2* 27.1* 25.6* 30.9*  MCV 87.5 86.3 84.5 85.0 86.2 89.6  PLT 419* 374 347 319 255 268   Cardiac Enzymes: No results for input(s): "CKTOTAL", "CKMB", "CKMBINDEX", "TROPONINI" in the last 168 hours. BNP: Invalid input(s): "POCBNP" CBG: Recent Labs  Lab 07/16/23 0808 07/16/23 1200 07/16/23 2131 07/17/23 1046 07/17/23 2151  GLUCAP 149* 81 125* 77 141*   D-Dimer No results for input(s): "DDIMER" in the last 72 hours. Hgb A1c No results for input(s): "HGBA1C" in the last 72 hours. Lipid Profile No results for input(s): "CHOL", "HDL", "LDLCALC", "TRIG", "CHOLHDL", "LDLDIRECT" in the last 72 hours. Thyroid function studies No results for input(s): "TSH", "T4TOTAL", "T3FREE", "  THYROIDAB" in the last 72 hours.  Invalid input(s): "FREET3" Anemia work up No results for input(s): "VITAMINB12", "FOLATE", "FERRITIN", "TIBC", "IRON", "RETICCTPCT" in the last 72 hours. Urinalysis    Component Value Date/Time   COLORURINE YELLOW 07/11/2023 1920   APPEARANCEUR HAZY (A) 07/11/2023 1920   LABSPEC 1.011 07/11/2023 1920   PHURINE 5.0 07/11/2023 1920   GLUCOSEU NEGATIVE 07/11/2023 1920   HGBUR NEGATIVE 07/11/2023 1920   BILIRUBINUR NEGATIVE 07/11/2023 1920   KETONESUR 5 (A)  07/11/2023 1920   PROTEINUR NEGATIVE 07/11/2023 1920   UROBILINOGEN 0.2 03/23/2010 1156   NITRITE NEGATIVE 07/11/2023 1920   LEUKOCYTESUR LARGE (A) 07/11/2023 1920   Sepsis Labs Recent Labs  Lab 07/13/23 0550 07/14/23 0546 07/15/23 0501 07/16/23 0501  WBC 17.0* 14.0* 11.7* 11.9*   Microbiology Recent Results (from the past 240 hour(s))  Urine Culture     Status: None   Collection Time: 07/11/23  7:20 PM   Specimen: Urine, Random  Result Value Ref Range Status   Specimen Description   Final    URINE, RANDOM Performed at [facility], [address], [place], [place] [address]    Special Requests   Final    NONE Reflexed from [id number] Performed at [facility], [address] [place], [place] [address]    Culture   Final    NO GROWTH Performed at [facility], [address] [place], [place] [address]    Report Status 07/13/2023 FINAL  Final  SARS Coronavirus 2 by RT PCR (hospital order, performed in [facility] hospital lab) *cepheid single result test* Anterior Nasal Swab     Status: None   Collection Time: 07/11/23  7:40 PM   Specimen: Anterior Nasal Swab  Result Value Ref Range Status   SARS Coronavirus 2 by RT PCR NEGATIVE NEGATIVE Final    Comment: (NOTE) SARS-CoV-2 target nucleic acids are NOT DETECTED.  The SARS-CoV-2 RNA is generally detectable in upper and lower respiratory specimens during the acute phase of infection. The lowest concentration of SARS-CoV-2 viral copies this assay can detect is 250 copies / mL. A negative result does not preclude SARS-CoV-2 infection and should not be used as the sole basis for treatment or other patient management decisions.  A negative result may occur with improper specimen collection / handling, submission of specimen other than nasopharyngeal swab, presence of viral mutation(s) within the areas targeted by this assay, and inadequate number of viral copies (<250 copies / mL).  A negative result must be combined with clinical observations, patient history, and epidemiological information.  Fact Sheet for Patients:   RoadLapTop.co.za  Fact Sheet for Healthcare Providers: http://kim-miller.com/  This test is not yet approved or  cleared by the [place] FDA and has been authorized for detection and/or diagnosis of SARS-CoV-2 by FDA under an Emergency Use Authorization (EUA).  This EUA will remain in effect (meaning this test can be used) for the duration of the COVID-19 declaration under Section 564(b)(1) of the Act, 21 U.S.C. section 360bbb-3(b)(1), unless the authorization is terminated or revoked sooner.  Performed at [facility], [address], [place], [place] [address]   Culture, blood (Routine X 2) w Reflex to ID Panel     Status: None   Collection Time: 07/12/23  5:05 AM   Specimen: BLOOD RIGHT ARM  Result Value Ref Range Status   Specimen Description   Final    BLOOD RIGHT ARM Performed at [facility],  [address] [place], [place] [address]    Special Requests   Final    BOTTLES DRAWN AEROBIC ONLY Blood Culture results may not be optimal due to an inadequate volume of blood received in culture bottles Performed at [facility], [address] [place], [place] [address]    Culture   Final    NO GROWTH 5 DAYS Performed at [facility], [address] [place], [place] [address]    Report Status 07/17/2023 FINAL  Final  Culture, blood (Routine X 2) w Reflex to ID Panel     Status: Abnormal   Collection Time: 07/12/23  5:05 AM   Specimen: BLOOD RIGHT HAND  Result Value Ref Range Status   Specimen Description   Final    BLOOD RIGHT HAND Performed at [facility], [address], [place], [place] [address]    Special Requests   Final    BOTTLES DRAWN AEROBIC ONLY Blood Culture results may not be optimal due  to an inadequate volume of blood received in culture bottles Performed at [facility], [address], [place], [place] [address]    Culture  Setup Time   Final    GRAM POSITIVE COCCI AEROBIC BOTTLE ONLY CRITICAL RESULT CALLED TO, READ BACK BY AND VERIFIED WITH: PHARMD M. BELL 07/13/23 @  2142 BY AB    Culture (A)  Final    STAPHYLOCOCCUS EPIDERMIDIS THE SIGNIFICANCE OF ISOLATING THIS ORGANISM FROM A SINGLE SET OF BLOOD CULTURES WHEN MULTIPLE SETS ARE DRAWN IS UNCERTAIN. PLEASE NOTIFY THE MICROBIOLOGY DEPARTMENT WITHIN ONE WEEK IF SPECIATION AND SENSITIVITIES ARE REQUIRED. Performed at [facility], [address] [place], [place] [address]    Report Status 07/15/2023 FINAL  Final  Blood Culture ID Panel (Reflexed)     Status: Abnormal   Collection Time: 07/12/23  5:05 AM  Result Value Ref Range Status   Enterococcus faecalis NOT DETECTED NOT DETECTED Final   Enterococcus Faecium NOT DETECTED NOT DETECTED Final   Listeria monocytogenes NOT DETECTED NOT DETECTED Final   Staphylococcus species DETECTED (A) NOT DETECTED Final    Comment: CRITICAL RESULT CALLED TO, READ BACK BY AND VERIFIED WITH: PHARMD M. BELL 07/13/23 @  2142 BY AB    Staphylococcus aureus (BCID) NOT DETECTED NOT DETECTED Final   Staphylococcus epidermidis DETECTED (A) NOT DETECTED Final    Comment: Methicillin (oxacillin) resistant coagulase negative staphylococcus. Possible blood culture contaminant (unless isolated from more than one blood culture draw or clinical case suggests pathogenicity). No antibiotic treatment is indicated for blood  culture contaminants. CRITICAL RESULT CALLED TO, READ BACK BY AND VERIFIED WITH: PHARMD M. BELL 07/13/23 @  2142 BY AB    Staphylococcus lugdunensis NOT DETECTED NOT DETECTED Final   Streptococcus species NOT DETECTED NOT DETECTED Final   Streptococcus agalactiae NOT DETECTED NOT DETECTED Final   Streptococcus pneumoniae NOT DETECTED NOT DETECTED Final    Streptococcus pyogenes NOT DETECTED NOT DETECTED Final   A.calcoaceticus-baumannii NOT DETECTED NOT DETECTED Final   Bacteroides fragilis NOT DETECTED NOT DETECTED Final   Enterobacterales NOT DETECTED NOT DETECTED Final   Enterobacter cloacae complex NOT DETECTED NOT DETECTED Final   Escherichia coli NOT DETECTED NOT DETECTED Final   Klebsiella aerogenes NOT DETECTED NOT DETECTED Final   Klebsiella oxytoca NOT DETECTED NOT DETECTED Final   Klebsiella pneumoniae NOT DETECTED NOT DETECTED Final   Proteus species NOT DETECTED NOT DETECTED Final   Salmonella species NOT DETECTED  NOT DETECTED Final   Serratia marcescens NOT DETECTED NOT DETECTED Final   Haemophilus influenzae NOT DETECTED NOT DETECTED Final   Neisseria meningitidis NOT DETECTED NOT DETECTED Final   Pseudomonas aeruginosa NOT DETECTED NOT DETECTED Final   Stenotrophomonas maltophilia NOT DETECTED NOT DETECTED Final   Candida albicans NOT DETECTED NOT DETECTED Final   Candida auris NOT DETECTED NOT DETECTED Final   Candida glabrata NOT DETECTED NOT DETECTED Final   Candida krusei NOT DETECTED NOT DETECTED Final   Candida parapsilosis NOT DETECTED NOT DETECTED Final   Candida tropicalis NOT DETECTED NOT DETECTED Final   Cryptococcus neoformans/gattii NOT DETECTED NOT DETECTED Final   Methicillin resistance mecA/C DETECTED (A) NOT DETECTED Final    Comment: CRITICAL RESULT CALLED TO, READ BACK BY AND VERIFIED WITH: PHARMD M. BELL 07/13/23 @  2142 BY AB Performed at [facility], [address], [place], [place] [address]      Time coordinating discharge: Over 30 minutes  SIGNED:   Alvira Philips [place], DO  [facility] 07/18/2023, 1:34 PM

## 2023-07-18 NOTE — TOC Progression Note (Signed)
Transition of Care [facility]) - Progression Note    Patient Details  Name: Amer Jaynes MRN: [id number] Date of Birth: [birth year]  [facility] CM/SW Contact  Geni Bers, RN Phone Number: 07/18/2023, 10:17 AM  Clinical Narrative:     Spoke with pt's daughter Massie Bougie concerning hospice. Belinda agreed to continue with Hospice at home.  Pt is active with [facility]), Cheri, RN with [facility] was called. Pt will discharge home with daughters and [facility].  Expected Discharge Plan: Home w Home Health Services Barriers to Discharge: Continued Medical Work up  Expected Discharge Plan and Services   Discharge Planning Services: CM Consult   Living arrangements for the past 2 months: Single Family Home                                       Social Determinants of Health (SDOH) Interventions SDOH Screenings   Food Insecurity: No Food Insecurity (07/12/2023)  Housing: Low Risk  (07/12/2023)  Transportation Needs: No Transportation Needs (07/12/2023)  Utilities: Not At Risk (07/12/2023)  Tobacco Use: Low Risk  (07/11/2023)    Readmission Risk Interventions    07/12/2023   11:00 AM 07/02/2023   12:00 PM 07/01/2023   12:14 PM  Readmission Risk Prevention Plan  Transportation Screening Complete Complete   Medication Review Oceanographer) Complete Referral to Pharmacy   PCP or Specialist appointment within 3-5 days of discharge Complete Complete Not Complete  PCP/Specialist Appt Not Complete comments  patient does have PCP and will follow up appt schediled with specialists  HRI or Home Care Consult Complete Complete   HRI or Home Care Consult Pt Refusal Comments  list provided for daughter   SW Recovery Care/Counseling Consult Complete Complete Not Complete  Palliative Care Screening Complete Complete   Skilled Nursing Facility Not Applicable Patient Refused Not Complete

## 2023-07-19 ENCOUNTER — Other Ambulatory Visit: Payer: Self-pay

## 2023-07-19 ENCOUNTER — Encounter: Payer: Self-pay | Admitting: Hematology and Oncology

## 2023-07-19 ENCOUNTER — Other Ambulatory Visit (HOSPITAL_COMMUNITY): Payer: Self-pay

## 2023-07-23 ENCOUNTER — Ambulatory Visit: Payer: Medicare PPO | Admitting: Radiation Oncology

## 2023-07-23 ENCOUNTER — Ambulatory Visit: Payer: Medicare PPO

## 2023-07-24 ENCOUNTER — Encounter: Payer: Self-pay | Admitting: Hematology and Oncology

## 2023-07-26 ENCOUNTER — Encounter: Payer: Self-pay | Admitting: Hematology and Oncology

## 2023-07-30 ENCOUNTER — Encounter: Payer: Self-pay | Admitting: Hematology and Oncology

## 2023-07-30 ENCOUNTER — Ambulatory Visit: Payer: Medicare PPO | Admitting: Radiation Oncology

## 2023-07-30 NOTE — Progress Notes (Signed)
Pharmacist Chemotherapy Monitoring - Initial Assessment    Anticipated start date: 08/06/23   The following has been reviewed per standard work regarding the patient's treatment regimen: The patient's diagnosis, treatment plan and drug doses, and organ/hematologic function Lab orders and baseline tests specific to treatment regimen  The treatment plan start date, drug sequencing, and pre-medications Prior authorization status  Patient's documented medication list, including drug-drug interaction screen and prescriptions for anti-emetics and supportive care specific to the treatment regimen The drug concentrations, fluid compatibility, administration routes, and timing of the medications to be used The patient's access for treatment and lifetime cumulative dose history, if applicable  The patient's medication allergies and previous infusion related reactions, if applicable   Changes made to treatment plan:  treatment plan date  Follow up needed:  N/A   Ebony Hail, Pharm.D., CPP 07/30/2023@3 :47 PM

## 2023-08-06 ENCOUNTER — Encounter: Payer: Medicare PPO | Admitting: Dietician

## 2023-08-06 ENCOUNTER — Ambulatory Visit: Payer: Medicare PPO

## 2023-08-06 ENCOUNTER — Ambulatory Visit: Payer: Medicare PPO | Admitting: Hematology and Oncology

## 2023-08-18 DEATH — deceased

## 2023-08-27 ENCOUNTER — Ambulatory Visit: Payer: Medicare PPO

## 2023-09-17 ENCOUNTER — Ambulatory Visit: Payer: Medicare PPO

## 2023-10-14 ENCOUNTER — Ambulatory Visit: Payer: Medicare PPO | Admitting: Cardiovascular Disease

## 2023-11-20 ENCOUNTER — Other Ambulatory Visit (HOSPITAL_COMMUNITY): Payer: Self-pay

## 2024-01-14 ENCOUNTER — Other Ambulatory Visit (HOSPITAL_COMMUNITY): Payer: Self-pay

## 2024-02-18 ENCOUNTER — Other Ambulatory Visit: Payer: Self-pay
